# Patient Record
Sex: Female | Born: 1945 | Race: Black or African American | Hispanic: No | Smoking: Former smoker
Health system: Southern US, Community
[De-identification: ages and names within clinical notes are randomized; demographics above are authoritative.]

## PROBLEM LIST (undated history)

## (undated) DIAGNOSIS — K219 Gastro-esophageal reflux disease without esophagitis: Secondary | ICD-10-CM

## (undated) DIAGNOSIS — G629 Polyneuropathy, unspecified: Secondary | ICD-10-CM

## (undated) DIAGNOSIS — I10 Essential (primary) hypertension: Secondary | ICD-10-CM

## (undated) DIAGNOSIS — K279 Peptic ulcer, site unspecified, unspecified as acute or chronic, without hemorrhage or perforation: Secondary | ICD-10-CM

## (undated) DIAGNOSIS — G459 Transient cerebral ischemic attack, unspecified: Secondary | ICD-10-CM

## (undated) DIAGNOSIS — D649 Anemia, unspecified: Secondary | ICD-10-CM

## (undated) DIAGNOSIS — E785 Hyperlipidemia, unspecified: Secondary | ICD-10-CM

## (undated) DIAGNOSIS — M069 Rheumatoid arthritis, unspecified: Secondary | ICD-10-CM

## (undated) DIAGNOSIS — H269 Unspecified cataract: Secondary | ICD-10-CM

## (undated) DIAGNOSIS — M1990 Unspecified osteoarthritis, unspecified site: Secondary | ICD-10-CM

## (undated) DIAGNOSIS — I2510 Atherosclerotic heart disease of native coronary artery without angina pectoris: Secondary | ICD-10-CM

## (undated) DIAGNOSIS — N186 End stage renal disease: Secondary | ICD-10-CM

## (undated) HISTORY — PX: CHOLECYSTECTOMY: SHX55

## (undated) HISTORY — PX: SHOULDER SURGERY: SHX246

## (undated) HISTORY — DX: Hyperlipidemia, unspecified: E78.5

## (undated) HISTORY — PX: HIP FRACTURE SURGERY: SHX118

## (undated) HISTORY — DX: Unspecified osteoarthritis, unspecified site: M19.90

## (undated) HISTORY — DX: Transient cerebral ischemic attack, unspecified: G45.9

## (undated) HISTORY — DX: Unspecified cataract: H26.9

## (undated) HISTORY — PX: NECK SURGERY: SHX720

## (undated) HISTORY — DX: Rheumatoid arthritis, unspecified: M06.9

## (undated) HISTORY — DX: Essential (primary) hypertension: I10

## (undated) HISTORY — DX: Peptic ulcer, site unspecified, unspecified as acute or chronic, without hemorrhage or perforation: K27.9

## (undated) HISTORY — PX: KNEE SURGERY: SHX244

## (undated) HISTORY — DX: Gastro-esophageal reflux disease without esophagitis: K21.9

## (undated) HISTORY — PX: CARPAL TUNNEL RELEASE: SHX101

## (undated) HISTORY — PX: ABSCESS DRAINAGE: SHX1119

---

## 2000-05-20 ENCOUNTER — Encounter: Admission: RE | Admit: 2000-05-20 | Discharge: 2000-07-16 | Payer: Self-pay | Admitting: Unknown Physician Specialty

## 2003-02-17 ENCOUNTER — Observation Stay (HOSPITAL_COMMUNITY): Admission: EM | Admit: 2003-02-17 | Discharge: 2003-02-19 | Payer: Self-pay | Admitting: Cardiology

## 2003-07-09 ENCOUNTER — Ambulatory Visit (HOSPITAL_COMMUNITY): Admission: RE | Admit: 2003-07-09 | Discharge: 2003-07-11 | Payer: Self-pay | Admitting: Neurosurgery

## 2003-08-24 ENCOUNTER — Ambulatory Visit (HOSPITAL_COMMUNITY): Admission: RE | Admit: 2003-08-24 | Discharge: 2003-08-24 | Payer: Self-pay | Admitting: Orthopaedic Surgery

## 2003-08-24 ENCOUNTER — Ambulatory Visit (HOSPITAL_BASED_OUTPATIENT_CLINIC_OR_DEPARTMENT_OTHER): Admission: RE | Admit: 2003-08-24 | Discharge: 2003-08-24 | Payer: Self-pay | Admitting: Orthopaedic Surgery

## 2004-01-11 ENCOUNTER — Ambulatory Visit (HOSPITAL_BASED_OUTPATIENT_CLINIC_OR_DEPARTMENT_OTHER): Admission: RE | Admit: 2004-01-11 | Discharge: 2004-01-11 | Payer: Self-pay | Admitting: Orthopaedic Surgery

## 2004-01-11 ENCOUNTER — Ambulatory Visit (HOSPITAL_COMMUNITY): Admission: RE | Admit: 2004-01-11 | Discharge: 2004-01-11 | Payer: Self-pay | Admitting: Orthopaedic Surgery

## 2009-07-20 ENCOUNTER — Ambulatory Visit (HOSPITAL_COMMUNITY): Admission: RE | Admit: 2009-07-20 | Discharge: 2009-07-20 | Payer: Self-pay | Admitting: Internal Medicine
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
## 2010-11-03 NOTE — Cardiovascular Report (Signed)
Erica Crosby, Erica Crosby                              ACCOUNT NO.:  [id number]   MEDICAL RECORD NO.:  [id number]                   PATIENT TYPE:  INP   LOCATION:  3729                                 FACILITY:  [facility]   PHYSICIAN:  Loretha Brasil. Lia Foyer, M.D.             DATE OF BIRTH:  April 01, 1946   DATE OF PROCEDURE:  02/19/2003  DATE OF DISCHARGE:                              CARDIAC CATHETERIZATION   PROCEDURES:  1. Left heart catheterization.  2. Selective coronary arteriography.  3. Selective left ventriculography.   CARDIOLOGIST:  Loretha Brasil. Lia Foyer, M.D.   INDICATIONS:  Erica Crosby is a 65 year old woman who presents with chest  pain.  The exact etiology of the chest pain was uncertain, however, the  patient has multiple risk factors including diabetes, hypertension and  positive family history.  The current study was done to assess coronary  anatomy.   DESCRIPTION OF PROCEDURE:  The procedure was performed from the right  femoral artery using 6 French catheters.  She tolerated the procedure  well  and there were no complications.  She was taken to the holding area in  satisfactory clinical condition.   HEMODYNAMIC DATA:  1. The central aortic pressure was 133/71, mean 96.  2. Left ventricular pressure 142/18.  3. No gradient on pullback across the aortic valve.   ANGIOGRAPHIC DATA:  1. Ventriculography was performed in the RAO projection and overall systolic     function is well preserved.  No segmental abnormalities or contractions     were identified.  Ejection fraction appeared to exceed 65%.  2. The left main coronary artery is free of critical disease.  3. The left anterior descending artery coursed to the apex.  There was     approximately 0-10% narrowing of the proximal LAD with mild eccentric     plaquing.  After the diagonal in the distal vessel there was hinge point     that had about perhaps 30-50% area of luminal irregularity in the more     distal portion of the  vessel.  4. The circumflex provided a large first marginal with multiple bifurcations     and an AV circumflex, both of which were free of critical disease.  5. The right coronary artery had a dominant vessel with about 10-20%     eccentric plaquing in the proximal vessel.  The PDA and the posterior     lateral systems were without critical narrowing.   CONCLUSIONS:  1. Well-preserved left ventricular function without segmental wall motion     abnormality.  2. Minor coronary irregularities as noted above.    DISPOSITION:  We would strongly recommend that the patient discontinue  smoking.  D-Dimer will be checked.  Follow up with be with Dr. Dannielle Burn and  Dr. __________.  Loretha Brasil. Lia Foyer, M.D.    TDS/MEDQ  D:  02/19/2003  T:  02/19/2003  Job:  [id number]   cc:   Ernestine Mcmurray, M.D.  [address]  [place] [address]   [facility]   Dola Argyle, M.D.
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
## 2010-11-03 NOTE — Discharge Summary (Signed)
Erica Crosby, OSHER NO.:  [id number]   MEDICAL RECORD NO.:  [id number]                   PATIENT TYPE:  INP   LOCATION:  [id number]                                 FACILITY:  [facility]   PHYSICIAN:  Dola Argyle, M.D.                  DATE OF BIRTH:  November 24, 1945   DATE OF ADMISSION:  02/17/2003  DATE OF DISCHARGE:                           DISCHARGE SUMMARY - REFERRING   DISCHARGE DIAGNOSES:  Substernal chest pain, negative cardiac enzymes,  electrocardiogram with no ST segment changes, and finding of nonobstructive  coronary artery disease by left heart catheterization February 19, 2003.   SECONDARY DIAGNOSES:  1. Diabetes mellitus. Recommend treatment with Glucophage after 48 hours     rest following catheterization.  2. Hypertension.  3. Dyslipidemia.  4. Arthritis.  5. History of ongoing tobacco use.   PROCEDURE:  May 21, 2003 left heart catheterization, coronary  arteriography. This study shows that the left ventricle has normal systolic  function. There is no mitral regurgitation. Ejection fraction greater than  65%. The right coronary artery had a 10 to 20% proximal to mid point  stenosis. The left anterior descending had a 10% proximal stenosis and a 30  to 50% mid point stenosis. The left circumflex is without obstructive  disease. Diagonals from the LAD and the PDA and posterolateral branches from  the right coronary artery are without disease. The patient has not  experienced any complications from the catheterization. Her right groin is  without swelling or drainage. She has good perfusion to the right lower  extremity with palpable pulse in the right dorsalis pedis region. Her pain  is well controlled, although she did require some pain medications in the  rest period post catheterization. At the time of the catheterization and  throughout her hospitalization, she has been chest pain free although she  has been maintained on IV heparin  and IV nitroglycerin prior to  catheterization. She was also placed on Lopressor 25 mg b.i.d. prior to the  catheterization.   DISCHARGE DISPOSITION:  Ms. Erica Crosby is ready for discharge after six-hour  lying in period done post left heart catheterization done earlier on  September 3. Study showed nonobstructive coronary artery disease and normal  left ventricular function. The patient has had some pain in the right groin  area. This is not associated with swelling or oozing. The patient is  controlled with oral analgesics. The patient is chest pain free at the  current time. Mental status is clear. Her laboratories will be dictated at  the end of this discharge summary. [name] will go home on the following  as mentioned above. [name] has history of diabetes which is  uncontrolled. It is recommended that she start medication for this, and the  primary recommendation per cardiology is Glucophage to start after a 48 hour  period has elapsed after catheterization.  She has been placed on Altace for  hypertensive control in a setting of diabetes. She was also found to be  dyslipidemic, and it would be to her benefit to have statin therapy.   DISCHARGE MEDICATIONS:  She will go home on the following medications:  1. Enteric-coated aspirin 325 mg daily.  2. Altace 2.5 mg daily.  3. Fish oil capsules 1,000 mg daily.  4. Nitroglycerin 0.4 mg one tablet under the tongue every five minutes x3     doses as needed for chest pain.  5. She will also have some Percocet 5/325 one to two tablets every four to     six hours p.r.n. pain, but she may take Tylenol if that will suffice.   DISCHARGE DIET:  Low sodium, low cholesterol, diabetic diet.   ACTIVITY:  [name] may shower.   FOLLOW UP:  She is to call the [facility] office of [place] if her  catheterization site swells or become more painful. Telephone number [phone]. She is asked not to engage in any heavy lifting, no  driving, no sexual  activity for the next two days. She will have a visit at [facility] office Thursday, March 04, 2003 at 2:15 p.m. She is asked to  schedule an appointment with Dr. Seward Speck to discuss diabetes management.   BRIEF HISTORY:  Erica Crosby is a 65 year old African-American female. She has  multiple cardiac risk factors including diabetes, hypertension, and tobacco  use. The patient reports sudden onset of heavy substernal chest pain  starting on the evening of August 31. The patient stated this was a heavy  tightness radiating somewhat into the neck area as well as to back. She  describes it as a tight squeezing sensation. She also felt some associated  shortness of breath, and the patient did wax and wane although there was  gradual worsening during the early morning hours of September 1. The patient  has been very concerned about the problem and decided to come to the  emergency room at [facility] in [place]. In the emergency room,  the patient still had some substernal chest pain. There were no  electrocardiographic changes, but after having been given sublingual  nitroglycerin, she felt almost immediate improvement in symptoms. The  patient is quite comfortable at the time of examination. She has no  shortness of breath. She denies any prior history of orthopnea, paroxysmal  nocturnal dyspnea, palpitations, or recent syncope. Initial set of troponins  is also negative for elevation. The patient will be transferred to [facility], and in light of high risk factors, it is advisable to proceed  with left heart catheterization.   HOSPITAL COURSE:  After transfer to [facility] on IV heparin, IV  nitroglycerin, and addition of beta blocker, the patient was relatively pain  free. She was scheduled for left heart catheterization. This was done on September 3. This study showed nonobstructive coronary artery disease. The  patient  tolerated the procedure well and was ready for discharge the same  day, September 3. She goes home with the medications and the followup as  dictated above.   DISCHARGE LABORATORY DATA:  Serial cardiac enzymes are:  Creatine kinase in  serial fashion 164, then 159, then 154; troponin I 0.01, then 0.01, then  0.01; CK-MB is 1.6, then 1.3, then 1.4. Her fasting lipid study:  Cholesterol 167, triglycerides 198, cholesterol 39, HDL cholesterol is 39,  LDL cholesterol 88.  A D-dimer study was obtained; it was less than 0.22.  Complete blood cells on the morning of September 3:  White cells 8.1,  hemoglobin 12.2, hematocrit 36.3 platelets 187. Serum electrolytes on  admission:  Sodium 140, potassium 3.2, chloride 106, bicarbonate 28, glucose  146, BUN 6, creatinine 0.8. Of note, her potassium has been corrected this  hospitalization.      Sueanne Margarita, P.A.                    Dola Argyle, M.D.    GM/MEDQ  D:  02/19/2003  T:  02/20/2003  Job:  [id number]   cc:   [facility]  [address] [place], [place] [address]   Ernestine Mcmurray, M.D.  [address]  [place] [address]   [facility] [address]  [place] [address]  Fax: [phone]
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
## 2012-02-09 ENCOUNTER — Emergency Department (HOSPITAL_COMMUNITY): Payer: Medicare HMO

## 2012-02-09 ENCOUNTER — Encounter (HOSPITAL_COMMUNITY): Payer: Self-pay | Admitting: Emergency Medicine

## 2012-02-09 ENCOUNTER — Ambulatory Visit (HOSPITAL_COMMUNITY): Admit: 2012-02-09 | Payer: Self-pay | Admitting: Cardiovascular Disease

## 2012-02-09 ENCOUNTER — Encounter (HOSPITAL_COMMUNITY)
Admission: EM | Disposition: A | Discharge status: Home / Self Care | Payer: Self-pay | Source: Home / Self Care | Attending: Family Medicine

## 2012-02-09 ENCOUNTER — Observation Stay (HOSPITAL_COMMUNITY)
Admission: EM | Admit: 2012-02-09 | Discharge: 2012-02-11 | DRG: 312 | Disposition: A | Discharge status: Home / Self Care | Payer: Medicare HMO | Attending: Family Medicine | Admitting: Family Medicine

## 2012-02-09 DIAGNOSIS — E1149 Type 2 diabetes mellitus with other diabetic neurological complication: Secondary | ICD-10-CM | POA: Insufficient documentation

## 2012-02-09 DIAGNOSIS — R270 Ataxia, unspecified: Secondary | ICD-10-CM

## 2012-02-09 DIAGNOSIS — R279 Unspecified lack of coordination: Secondary | ICD-10-CM | POA: Insufficient documentation

## 2012-02-09 DIAGNOSIS — E1142 Type 2 diabetes mellitus with diabetic polyneuropathy: Secondary | ICD-10-CM | POA: Insufficient documentation

## 2012-02-09 DIAGNOSIS — R55 Syncope and collapse: Principal | ICD-10-CM | POA: Insufficient documentation

## 2012-02-09 DIAGNOSIS — G459 Transient cerebral ischemic attack, unspecified: Secondary | ICD-10-CM | POA: Insufficient documentation

## 2012-02-09 DIAGNOSIS — E1165 Type 2 diabetes mellitus with hyperglycemia: Secondary | ICD-10-CM

## 2012-02-09 DIAGNOSIS — IMO0002 Reserved for concepts with insufficient information to code with codable children: Secondary | ICD-10-CM

## 2012-02-09 DIAGNOSIS — R269 Unspecified abnormalities of gait and mobility: Secondary | ICD-10-CM | POA: Insufficient documentation

## 2012-02-09 DIAGNOSIS — G319 Degenerative disease of nervous system, unspecified: Secondary | ICD-10-CM | POA: Insufficient documentation

## 2012-02-09 DIAGNOSIS — I10 Essential (primary) hypertension: Secondary | ICD-10-CM | POA: Insufficient documentation

## 2012-02-09 DIAGNOSIS — E119 Type 2 diabetes mellitus without complications: Secondary | ICD-10-CM

## 2012-02-09 DIAGNOSIS — Z79899 Other long term (current) drug therapy: Secondary | ICD-10-CM | POA: Insufficient documentation

## 2012-02-09 DIAGNOSIS — I2510 Atherosclerotic heart disease of native coronary artery without angina pectoris: Secondary | ICD-10-CM | POA: Insufficient documentation

## 2012-02-09 DIAGNOSIS — Z7982 Long term (current) use of aspirin: Secondary | ICD-10-CM | POA: Insufficient documentation

## 2012-02-09 DIAGNOSIS — Z794 Long term (current) use of insulin: Secondary | ICD-10-CM | POA: Insufficient documentation

## 2012-02-09 DIAGNOSIS — R5381 Other malaise: Secondary | ICD-10-CM | POA: Insufficient documentation

## 2012-02-09 HISTORY — DX: Atherosclerotic heart disease of native coronary artery without angina pectoris: I25.10

## 2012-02-09 HISTORY — DX: Polyneuropathy, unspecified: G62.9

## 2012-02-09 HISTORY — DX: Essential (primary) hypertension: I10

## 2012-02-09 LAB — BASIC METABOLIC PANEL
BUN: 20 mg/dL (ref 6–23)
CO2: 25 mEq/L (ref 19–32)
Calcium: 10.4 mg/dL (ref 8.4–10.5)
Chloride: 96 mEq/L (ref 96–112)
Creatinine, Ser: 1.08 mg/dL (ref 0.50–1.10)
GFR calc Af Amer: 62 mL/min — ABNORMAL LOW (ref >90)
Glucose, Bld: 272 mg/dL — ABNORMAL HIGH (ref 70–99)
Potassium: 4.2 mEq/L (ref 3.5–5.1)
Sodium: 133 mEq/L — ABNORMAL LOW (ref 135–145)

## 2012-02-09 LAB — CBC
HCT: 40.5 % (ref 36.0–46.0)
Hemoglobin: 14.3 g/dL (ref 12.0–15.0)
MCH: 29.4 pg (ref 26.0–34.0)
MCHC: 35.3 g/dL (ref 30.0–36.0)
MCV: 83.2 fL (ref 78.0–100.0)
RBC: 4.87 MIL/uL (ref 3.87–5.11)
RDW: 13.6 % (ref 11.5–15.5)
WBC: 11.6 10*3/uL — ABNORMAL HIGH (ref 4.0–10.5)

## 2012-02-09 LAB — POCT I-STAT TROPONIN I: Troponin i, poc: 0.01 ng/mL (ref 0.00–0.08)

## 2012-02-09 LAB — GLUCOSE, CAPILLARY: Glucose-Capillary: 219 mg/dL — ABNORMAL HIGH (ref 70–99)

## 2012-02-09 SURGERY — LEFT HEART CATHETERIZATION WITH CORONARY ANGIOGRAM
Anesthesia: LOCAL

## 2012-02-09 MED ORDER — SODIUM CHLORIDE 0.9 % IV BOLUS (SEPSIS): 500.0000 mL | Freq: Once | INTRAVENOUS | Status: AC
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
## 2012-02-09 NOTE — ED Provider Notes (Signed)
History     CSN: [id number]  Arrival date & time 02/09/12  [id number]   First MD Initiated Contact with Patient 02/09/12 2007      Chief Complaint  Patient presents with  . Loss of Consciousness    (Consider location/radiation/quality/duration/timing/severity/associated sxs/prior treatment) HPIEva JOYLIN Crosby is a 66 y.o. female presenting as a code STEMI which was probably called off on arrival with a negative EKG. She was at a party earlier this evening when she had a syncopal event which was witnessed by others. He called EMS and brought her to the emergency department where she recovered uneventfully except when she told me she was 66 years old. Otherwise she has been alert and oriented, GCS 15.  She complains of no chest pain, shortness of breath, headache, dizziness, lightheadedness, numbness or tingling that is new. Patient does have some chronic neuropathy.   She has done nothing for the symptoms, there are no other alleviating or exacerbating factors no associated symptoms. She's not been sick recently.   Past Medical History  Diagnosis Date  . Hypertension   . Diabetes mellitus   . Neuropathy     Past Surgical History  Procedure Date  . Cardiac catheterization     states in the 90's    No family history on file.  History  Substance Use Topics  . Smoking status: Not on file  . Smokeless tobacco: Not on file  . Alcohol Use:     OB History    Grav Para Term Preterm Abortions TAB SAB Ect Mult Living                  Review of Systems .GENERAL: No fevers or chills, weight loss. HEENT: No change in vision, no earache, sore throat or sinus congestion. NECK: No pain or stiffness. CARDIOVASCULAR: No chest pain or pressure, palpitations, syncope. PULMONARY: No shortness of breath, cough or wheeze. GASTROINTESTINAL: No abdominal pain, nausea, vomiting or diarrhea, melena or bright red blood per rectum. GENITOURINARY: No urinary frequency, urgency, hesitancy or dysuria.  MUSCULOSKELETAL: No joint or muscle pain, no back pain, no recent trauma. DERMATOLOGIC: No rash, no itching, no lesions. NEUROLOGIC: Positive for syncope; No headache, seizures, numbness, tingling or weakness.   Allergies  Review of patient's allergies indicates no known allergies.  Home Medications   Current Outpatient Rx  Name Route Sig Dispense Refill  . GABAPENTIN 400 MG PO CAPS Oral Take 400 mg by mouth 2 (two) times daily.    . INSULIN GLARGINE 100 UNIT/ML [place] SOLN Subcutaneous Inject 50 Units into the skin every evening.    Marland Kitchen METFORMIN HCL 500 MG PO TABS Oral Take 500 mg by mouth 2 (two) times daily with a meal.    . OMEGA-3-ACID ETHYL ESTERS 1 G PO CAPS Oral Take 1 g by mouth daily.    Marland Kitchen PANTOPRAZOLE SODIUM 40 MG PO TBEC Oral Take 40 mg by mouth daily.    Marland Kitchen PRESCRIPTION MEDICATION Oral Take 1 tablet by mouth daily. High blood pressure medication    . SIMVASTATIN 40 MG PO TABS Oral Take 40 mg by mouth every evening.      BP 124/53  Pulse 94  Temp 98.3 F (36.8 C) (Oral)  Resp 14  SpO2 98%  Physical Exam CONSTITUTIONAL: Awake, oriented, appears non-toxic HENT: Atraumatic, normocephalic, oral mucosa pink and moist, airway patent. Nares patent without drainage. External ears normal. EYES: Conjunctiva clear, EOMI, PERRLA 4 mm, bilateral arcus senilis. NECK: Trachea midline, non-tender, supple CARDIOVASCULAR: Normal  heart rate, Normal rhythm, No murmurs, rubs, gallops PULMONARY/CHEST: Clear to auscultation, no rhonchi, wheezes, or rales. Symmetrical breath sounds. Non-tender. ABDOMINAL: Non-distended, soft, non-tender - no rebound or guarding.  BS normal. NEUROLOGIC: A Facial sensation equal to light touch bilaterally.  Good muscle bulk in the masseter muscle and good lateral movement of the jaw.  Facial expressions equal and good strength with smile/frown and puffed cheeks.  Hearing grossly intact to finger rub test.  Uvula, tongue are midline with no deviation. Symmetrical palate  elevation.  Trapezius and SCM muscles are 5/5 strength bilaterally.   Non-focal, moving all four extremities, no gross sensory or motor deficits. EXTREMITIES: No clubbing, cyanosis, or edema SKIN: Warm, Dry, No erythema, No rash ED Course  Procedures (including critical care time)  Labs Reviewed  CBC - Abnormal; Notable for the following:    WBC 11.6 (*)     All other components within normal limits  BASIC METABOLIC PANEL - Abnormal; Notable for the following:    Sodium 133 (*)     Glucose, Bld 272 (*)     GFR calc non Af Amer 53 (*)     GFR calc Af Amer 62 (*)     All other components within normal limits  GLUCOSE, CAPILLARY - Abnormal; Notable for the following:    Glucose-Capillary 219 (*)     All other components within normal limits  POCT I-STAT TROPONIN I   Dg Chest Portable 1 View  02/09/2012  *RADIOLOGY REPORT*  Clinical Data: 66 year old female ST elevation myocardial infarction.  Shortness of breath and chest pain.  PORTABLE CHEST - 1 VIEW  Comparison: None.  Findings: Portable AP semi upright view at 2039 hours.  Cardiac size and mediastinal contours are within normal limits.  Visualized tracheal air column is within normal limits.  No pulmonary edema, pneumothorax or effusion.  Perihilar crowding of markings and mild air bronchograms on the left.  Cervical ACDF hardware partially visible.  Negative visualized osseous structures.  IMPRESSION: Atelectasis, otherwise no acute cardiopulmonary abnormality.   Original Report Authenticated By: Randall An, M.D.      No diagnosis found.    MDM  Erica Crosby is a 66 y.o. female presenting to the emergency department without any past pertinent medical history for syncopal episode, CVA or cardiac history however does have some risk factors including elevated cholesterol, diabetes, with a syncopal episode.  She arrived via EMS as a coated stent he however she gets a mild elevation in one lead but nothing that was suggestive of an  ST elevation MI and she had no reciprocal changes so the code STEMI was called off by the cardiologist and myself. Patient's initial workup is negative, EKG shows no arrhythmia, no ST or T wave abnormalities suggestive of acute ischemia or infarction.  The differential for syncope is extensive and includes, but is not limited to: arrythmia (Vtach, SVT, SSS, sinus arrest, AV block, bradycardia) aortic stenosis, AMI, HOCM, PE, atrial myxoma, pulmonary hypertension, orthostatic hypotension, (hypovolemia, drug effect, GB syndrome, micturition, cough, swall) carotid sinus sensitivity, Seizure, TIA/CVA, hypoglycemia, or possibly vertigo.  Although this list, the most likely thing, is possible TIAs the patient did tell me she was 66 years old a 1 point and was transiently confused. She since recovered and feels fine, she denies any chest pain or shortness of breath at this time, dizziness or headache.  There is any indication at this time to obtain a head CT. She shows no neurologic deficits on physical  exam.  Discussed patient with Dr.Doutov with the patient for syncopal workup.   Rhunette Croft, MD 02/10/12 [id number]

## 2012-02-09 NOTE — ED Notes (Signed)
Amy called in Cath Lab to report cancel of Code Stemi per Dr. Gwenlyn Found.

## 2012-02-10 ENCOUNTER — Inpatient Hospital Stay (HOSPITAL_COMMUNITY): Payer: Medicare HMO

## 2012-02-10 ENCOUNTER — Observation Stay (HOSPITAL_COMMUNITY): Payer: Medicare HMO

## 2012-02-10 ENCOUNTER — Encounter (HOSPITAL_COMMUNITY): Payer: Self-pay | Admitting: Internal Medicine

## 2012-02-10 DIAGNOSIS — E119 Type 2 diabetes mellitus without complications: Secondary | ICD-10-CM | POA: Insufficient documentation

## 2012-02-10 DIAGNOSIS — E1121 Type 2 diabetes mellitus with diabetic nephropathy: Secondary | ICD-10-CM

## 2012-02-10 DIAGNOSIS — E118 Type 2 diabetes mellitus with unspecified complications: Secondary | ICD-10-CM | POA: Insufficient documentation

## 2012-02-10 DIAGNOSIS — I10 Essential (primary) hypertension: Secondary | ICD-10-CM

## 2012-02-10 DIAGNOSIS — E860 Dehydration: Secondary | ICD-10-CM | POA: Insufficient documentation

## 2012-02-10 DIAGNOSIS — IMO0002 Reserved for concepts with insufficient information to code with codable children: Secondary | ICD-10-CM

## 2012-02-10 DIAGNOSIS — R55 Syncope and collapse: Secondary | ICD-10-CM | POA: Diagnosis present

## 2012-02-10 DIAGNOSIS — R270 Ataxia, unspecified: Secondary | ICD-10-CM | POA: Diagnosis present

## 2012-02-10 DIAGNOSIS — I2510 Atherosclerotic heart disease of native coronary artery without angina pectoris: Secondary | ICD-10-CM

## 2012-02-10 DIAGNOSIS — G459 Transient cerebral ischemic attack, unspecified: Secondary | ICD-10-CM

## 2012-02-10 DIAGNOSIS — R42 Dizziness and giddiness: Secondary | ICD-10-CM

## 2012-02-10 DIAGNOSIS — D72829 Elevated white blood cell count, unspecified: Secondary | ICD-10-CM | POA: Insufficient documentation

## 2012-02-10 HISTORY — DX: Syncope and collapse: R55

## 2012-02-10 HISTORY — DX: Essential (primary) hypertension: I10

## 2012-02-10 HISTORY — DX: Transient cerebral ischemic attack, unspecified: G45.9

## 2012-02-10 LAB — LIPID PANEL
Cholesterol: 115 mg/dL (ref 0–200)
HDL: 35 mg/dL — ABNORMAL LOW (ref >39)
Total CHOL/HDL Ratio: 3.3 RATIO

## 2012-02-10 LAB — URINE MICROSCOPIC-ADD ON

## 2012-02-10 LAB — URINALYSIS, ROUTINE W REFLEX MICROSCOPIC
Glucose, UA: NEGATIVE mg/dL
Specific Gravity, Urine: 1.015 (ref 1.005–1.030)
pH: 5 (ref 5.0–8.0)

## 2012-02-10 LAB — GLUCOSE, CAPILLARY
Glucose-Capillary: 179 mg/dL — ABNORMAL HIGH (ref 70–99)
Glucose-Capillary: 265 mg/dL — ABNORMAL HIGH (ref 70–99)
Glucose-Capillary: 264 mg/dL — ABNORMAL HIGH (ref 70–99)

## 2012-02-10 MED ORDER — ACETAMINOPHEN 325 MG PO TABS
650.0000 mg | ORAL_TABLET | ORAL | Status: DC | PRN
  Administered 2012-02-10: 650 mg via ORAL
  Filled 2012-02-10: qty 2

## 2012-02-10 MED ORDER — SODIUM CHLORIDE 0.9 % IV SOLN
INTRAVENOUS | Status: AC
  Administered 2012-02-10: 05:00:00 via INTRAVENOUS

## 2012-02-10 MED ORDER — INSULIN GLARGINE 100 UNIT/ML ~~LOC~~ SOLN
50.0000 [IU] | Freq: Every evening | SUBCUTANEOUS | Status: DC
  Administered 2012-02-10: 50 [IU] via SUBCUTANEOUS

## 2012-02-10 MED ORDER — PANTOPRAZOLE SODIUM 40 MG PO TBEC
40.0000 mg | DELAYED_RELEASE_TABLET | Freq: Every day | ORAL | Status: DC
Start: 2012-02-10 — End: 2012-02-11
  Administered 2012-02-10 – 2012-02-11 (×2): 40 mg via ORAL
  Filled 2012-02-10: qty 1

## 2012-02-10 MED ORDER — SIMVASTATIN 40 MG PO TABS
40.0000 mg | ORAL_TABLET | Freq: Every evening | ORAL | Status: DC
Start: 2012-02-10 — End: 2012-02-11
  Administered 2012-02-10: 40 mg via ORAL
  Filled 2012-02-10 (×2): qty 1

## 2012-02-10 MED ORDER — GABAPENTIN 400 MG PO CAPS
400.0000 mg | ORAL_CAPSULE | Freq: Two times a day (BID) | ORAL | Status: DC
Start: 2012-02-10 — End: 2012-02-11
  Administered 2012-02-10 – 2012-02-11 (×3): 400 mg via ORAL
  Filled 2012-02-10 (×5): qty 1

## 2012-02-10 MED ORDER — INSULIN ASPART 100 UNIT/ML ~~LOC~~ SOLN
0.0000 [IU] | Freq: Three times a day (TID) | SUBCUTANEOUS | Status: DC
  Administered 2012-02-10: 2 [IU] via SUBCUTANEOUS
  Administered 2012-02-10: 3 [IU] via SUBCUTANEOUS
  Administered 2012-02-10: 8 [IU] via SUBCUTANEOUS
  Administered 2012-02-11: 2 [IU] via SUBCUTANEOUS

## 2012-02-10 MED ORDER — INSULIN ASPART 100 UNIT/ML ~~LOC~~ SOLN
0.0000 [IU] | Freq: Every day | SUBCUTANEOUS | Status: DC
  Administered 2012-02-10: 3 [IU] via SUBCUTANEOUS

## 2012-02-10 MED ORDER — ASPIRIN 325 MG PO TABS
325.0000 mg | ORAL_TABLET | Freq: Every day | ORAL | Status: DC
  Administered 2012-02-10 – 2012-02-11 (×2): 325 mg via ORAL
  Filled 2012-02-10 (×2): qty 1

## 2012-02-10 NOTE — ED Notes (Signed)
Patient transported to CT 

## 2012-02-10 NOTE — H&P (Signed)
PCP:  No local   Chief Complaint:   syncope  HPI: Erica Crosby is a 66 y.o. female   has a past medical history of Hypertension; Diabetes mellitus; Neuropathy; and Coronary artery disease.   Presented with  She was not feeling right all day. She went to a church party around 7 pm and set down for a meal. She felt light headed and then passed out. She was not hot. No alcohol was involved. This was witnessed. No urinary incontinence. Per report she was clammy. Patient does not remember the event. She remembered standing up after and walking to sit down.  She still feels wobbly and unwell. NO chest pain. No Shortness of breath now. When stands up she does appear unsteady. She states she has been falling in the past but never felt unsteady to this degree. Patient was slightly confused when she first presented to emergency department now she is back to her baseline.  Review of Systems:    Pertinent positives include: fatigue, dizziness, gait abnormality,   Constitutional:  No weight loss, night sweats, Fevers, chills, weight loss  HEENT:  No headaches, Difficulty swallowing,Tooth/dental problems,Sore throat,  No sneezing, itching, ear ache, nasal congestion, post nasal drip,  Cardio-vascular:  No chest pain, Orthopnea, PND, anasarca,  palpitations.no Bilateral lower extremity swelling  GI:  No heartburn, indigestion, abdominal pain, nausea, vomiting, diarrhea, change in bowel habits, loss of appetite, melena, blood in stool, hematemesis Resp:  no shortness of breath at rest. No dyspnea on exertion, No excess mucus, no productive cough, No non-productive cough, No coughing up of blood.No change in color of mucus.No wheezing. Skin:  no rash or lesions. No jaundice GU:  no dysuria, change in color of urine, no urgency or frequency. No straining to urinate.  No flank pain.  Musculoskeletal:  No joint pain or no joint swelling. No decreased range of motion. No back pain.  Psych:  No  change in mood or affect. No depression or anxiety. No memory loss.  Neuro: no localizing neurological complaints, no tingling, no weakness, no double vision, nono slurred speech, no confusion  Otherwise ROS are negative except for above, 10 systems were reviewed  Past Medical History: Past Medical History  Diagnosis Date  . Hypertension   . Diabetes mellitus   . Neuropathy   . Coronary artery disease    Past Surgical History  Procedure Date  . Cardiac catheterization     states in the 90's     Medications: Prior to Admission medications   Medication Sig Start Date End Date Taking? Authorizing Provider  gabapentin (NEURONTIN) 400 MG capsule Take 400 mg by mouth 2 (two) times daily.   Yes Historical Provider, MD  insulin glargine (LANTUS) 100 UNIT/ML injection Inject 50 Units into the skin every evening.   Yes Historical Provider, MD  metFORMIN (GLUCOPHAGE) 500 MG tablet Take 500 mg by mouth 2 (two) times daily with a meal.   Yes Historical Provider, MD  omega-3 acid ethyl esters (LOVAZA) 1 G capsule Take 1 g by mouth daily.   Yes Historical Provider, MD  pantoprazole (PROTONIX) 40 MG tablet Take 40 mg by mouth daily.   Yes Historical Provider, MD  PRESCRIPTION MEDICATION Take 1 tablet by mouth daily. High blood pressure medication   Yes Historical Provider, MD  simvastatin (ZOCOR) 40 MG tablet Take 40 mg by mouth every evening.   Yes Historical Provider, MD    Allergies:  No Known Allergies  Social History:  Ambulatory independently  Lives at home   reports that she has been smoking Cigarettes.  She has smoked for the past 25 years. She does not have any smokeless tobacco history on file. She reports that she does not drink alcohol or use illicit drugs.   Family History: family history includes Alcohol abuse in her father; Diabetes in her mother; and Heart disease in her mother and sister.    Physical Exam: Patient Vitals for the past 24 hrs:  BP Temp Temp src Pulse  Resp SpO2  02/10/12 0036 141/65 mmHg 98.2 F (36.8 C) Oral 91  14  98 %  02/09/12 2312 124/53 mmHg - - 94  - -  02/09/12 2311 120/57 mmHg - - 101  - -  02/09/12 2310 146/68 mmHg - - 90  - -  02/09/12 2003 151/69 mmHg 98.3 F (36.8 C) Oral 103  14  98 %    1. General:  in No Acute distress 2. Psychological: Alert and Oriented 3. Head/ENT:    Dry Mucous Membranes                          Head Non traumatic, neck supple                          Normal Dentition 4. SKIN: decreased Skin turgor,  Skin clean Dry and intact no rash 5. Heart: Regular rate and rhythm no Murmur, Rub or gallop 6. Lungs: Clear to auscultation bilaterally, no wheezes occasional crackles   7. Abdomen: Soft, non-tender, Non distended 8. Lower extremities: no clubbing, cyanosis, or edema 9. Neurologically Grossly intact, strength appears to be equal throughout, grips equal bilaterally. No drift. Cranial 2 through 12 intact no droop. She does have positive Romberg sign. Appears to be  unstable on her feet. 10. MSK: Normal range of motion  body mass index is unknown because there is no height or weight on file.   Labs on Admission:   [facility] 02/09/12 2007  NA 133*  K 4.2  CL 96  CO2 25  GLUCOSE 272*  BUN 20  CREATININE 1.08  CALCIUM 10.4  MG --  PHOS --   No results found for this basename: AST:2,ALT:2,ALKPHOS:2,BILITOT:2,PROT:2,ALBUMIN:2 in the last 72 hours No results found for this basename: LIPASE:2,AMYLASE:2 in the last 72 hours  Basename 02/09/12 2007  WBC 11.6*  NEUTROABS --  HGB 14.3  HCT 40.5  MCV 83.2  PLT 247   No results found for this basename: CKTOTAL:3,CKMB:3,CKMBINDEX:3,TROPONINI:3 in the last 72 hours No results found for this basename: TSH,T4TOTAL,FREET3,T3FREE,THYROIDAB in the last 72 hours No results found for this basename: VITAMINB12:2,FOLATE:2,FERRITIN:2,TIBC:2,IRON:2,RETICCTPCT:2 in the last 72 hours No results found for this basename: HGBA1C    CrCl is unknown because  there is no height on file for the current visit. ABG No results found for this basename: phart, pco2, po2, hco3, tco2, acidbasedef, o2sat     No results found for this basename: DDIMER     Other results:  I have pearsonaly reviewed this: ECG REPORT  No EKG was obtained  Cultures: No results found for this basename: sdes, specrequest, cult, reptstatus       Radiological Exams on Admission: Dg Chest Portable 1 View  02/09/2012  *RADIOLOGY REPORT*  Clinical Data: 66 year old female ST elevation myocardial infarction.  Shortness of breath and chest pain.  PORTABLE CHEST - 1 VIEW  Comparison: None.  Findings: Portable AP semi upright view at  2039 hours.  Cardiac size and mediastinal contours are within normal limits.  Visualized tracheal air column is within normal limits.  No pulmonary edema, pneumothorax or effusion.  Perihilar crowding of markings and mild air bronchograms on the left.  Cervical ACDF hardware partially visible.  Negative visualized osseous structures.  IMPRESSION: Atelectasis, otherwise no acute cardiopulmonary abnormality.   Original Report Authenticated By: Randall An, M.D.     Chart has been reviewed  Assessment/Plan  66 year old female with syncope/TIA  Present on Admission:  .Syncope and collapse - episode witnessed, cardiac versus neurological etiology. Will admit to telemetry obtain EKG now. Cycle cardiac markers. Update echo gram and Dopplers in a.m.  We'll do TIA workup as well given ataxia .Diabetes mellitus - sliding scale insulin hold metformin  .Hypertension - controlled with her home medications this seems to be clarified in a.m.  .Leukocytosis - will obtain UA chest x-ray is unremarkable  .Dehydration - she appears to be hemoconcentrated and dehydrated by clinical exam will give IV fluids  .TIA (transient ischemic attack) - worsening ataxia and syncope. Syncope is not very typical for TIA/stroke. But given now worsening ataxia we'll admit and  evaluate with MRI MRA in a.m. and a stat CT scan now.  .Ataxia - PTOT evaluation workup for TIA/CVA   Prophylaxis: SCD  Protonix  CODE STATUS: FULL CODE  Other plan as per orders.  I have spent a total of 55 min on this admission  Cassian Torelli 02/10/2012, 1:07 AM

## 2012-02-10 NOTE — Progress Notes (Signed)
VASCULAR LAB PRELIMINARY  PRELIMINARY  PRELIMINARY  PRELIMINARY  Carotid Dopplers completed.    Preliminary report:  There is no ICA stenosis.  Vertebral artery flow is antegrade.  Erica Crosby, 02/10/2012, 10:16 AM

## 2012-02-10 NOTE — Progress Notes (Signed)
[facility] PROGRESS NOTE  Erica Crosby [id number] DOB: Aug 21, 1945 DOA: 02/09/2012 PCP: Cleda Mccreedy, MD  Assessment/Plan: 1. Syncope--2d echo pending. Prelim dopplers unremarkable, cardiac markers negative thus far. MRI/MRA brain/head negative. EKG this am--SR, septal infarct age unknown, no previous studies. ED study has not been found. Questionable ataxia--PT evaluation. 2. DM type 2, uncontrolled by HgbA1c 9.9--stable as inpatient. Continue SSI. Metformin on hold. Continue Lantus, SSI. 3. HTN--stable. Not on any outpatient medications. 4. History of CAD--continue Zocor, ASA.  Code Status: Full code Family Communication: none at bedside Disposition Plan: home 8/26 if echo unremarkable  Murray Hodgkins, MD  [facility] Team 8 Pager [phone]. If 8PM-8AM, please contact night-coverage at [email or web address], password [facility] 02/10/2012, 12:16 PM  LOS: 1 day   Brief narrative: 66 year old woman presented with history of syncope. ST elevation noted by EMS and code STEMI called--subsequently canceled by Dr. Gwenlyn Found. Syncope occurred after sitting down. Felt lightheaded and passed out. No alcohol. No urinary incontinence.  Consultants:  None  Procedures:  Bilateral carotid dopplers--  2D echocardiogram--  HPI/Subjective: No complaints, no recurrent syncope, no lightheadedness, no pain.  Objective: Filed Vitals:   02/10/12 0257 02/10/12 0600 02/10/12 0800 02/10/12 1000  BP: 134/77 114/53 108/64 141/76  Pulse: 87 82 66 92  Temp: 98.1 F (36.7 C) 97.8 F (36.6 C)  97.4 F (36.3 C)  TempSrc:    Oral  Resp: 16 16  16   Height: 5\' 11"  (1.803 m)     Weight: 67 kg (147 lb 11.3 oz)     SpO2: 95% 95%      Intake/Output Summary (Last 24 hours) at 02/10/12 1216 Last data filed at 02/10/12 0817  Gross per 24 hour  Intake  437.5 ml  Output    550 ml  Net -112.5 ml   Filed Weights   02/10/12 0257  Weight: 67 kg (147 lb 11.3 oz)    Exam:   General:  Appears well,  calm & comfortable  Cardiovascular: RRR, no m/r/g. No LE edema.  Respiratory: CTA bilaterally, no w/r/r. Normal respiratory effort.  Abdomen: soft, ntnd  Skin: no rash or induration, non-tender  Psychiatric: speech fluent and clear, grossly normal mood and affect  Musculoskeletal: normal tone and strength BUE, BLE  Neuro: CN II-XII intact  Data Reviewed: Basic Metabolic Panel:  Lab 123XX123 2007  NA 133*  K 4.2  CL 96  CO2 25  GLUCOSE 272*  BUN 20  CREATININE 1.08  CALCIUM 10.4  MG --  PHOS --   CBC:  Lab 02/09/12 2007  WBC 11.6*  NEUTROABS --  HGB 14.3  HCT 40.5  MCV 83.2  PLT 247   Cardiac Enzymes:  Lab 02/10/12 0520  CKTOTAL 53  CKMB 1.8  CKMBINDEX --  TROPONINI <0.30   CBG:  Lab 02/10/12 1151 02/10/12 0730 02/10/12 0252 02/09/12 2303  GLUCAP 265* 179* 275* 219*   Studies: Ct Head Without Contrast  02/10/2012  *RADIOLOGY REPORT*   IMPRESSION: Mild white matter changes as above.  No CT evidence for acute intracranial abnormality.   Original Report Authenticated By: Suanne Marker, M.D.    Mri Brain Without Contrast  02/10/2012  *RADIOLOGY REPORT*  Clinical Data:  Syncopal episode.  Now baseline, with no neurologic deficit.  History of hypertension and diabetes mellitus.  MRI HEAD WITHOUT CONTRAST MRA HEAD WITHOUT CONTRAST  Technique:  Multiplanar, multiecho pulse sequences of the brain and surrounding structures were obtained without intravenous contrast. Angiographic images of the head were obtained using  MRA technique without contrast.  Comparison:  02/10/2012 CT scan.  MRI HEAD  Findings:  There is no evidence for acute infarction, intracranial hemorrhage, mass lesion, hydrocephalus, or extra-axial fluid. There is mild atrophy with moderate sequelae of chronic microvascular ischemia in the periventricular and subcortical white matter. No foci of chronic hemorrhage.  Pituitary and cerebellar tonsils unremarkable.  No midline shift.  Normal calvarium  and skull base.  Upper cervical region negative.  No acute sinus or mastoid disease.  IMPRESSION: Mild atrophy with chronic microvascular ischemic change.  No acute intracranial findings.  Good agreement with prior CT.  MRA HEAD  Findings: Both internal carotid arteries are widely patent with mild nonstenotic irregularity in the cavernous and supraclinoid segments.  No visible upper cervical dissection.  Basilar artery widely patent with vertebrals codominant.  There is no proximal stenosis of the anterior, middle, or posterior cerebral arteries. Mild irregularity of the distal MCA and PCA branches suggests intracranial atherosclerotic disease.  No visible berry aneurysm.  IMPRESSION: Unremarkable MR angiography intracranial circulation.  No proximal flow reducing lesion or dissection.   Original Report Authenticated By: Staci Righter, M.D.    Dg Chest Portable 1 View  02/09/2012  *RADIOLOGY REPORT*  Clinical Data: 66 year old female ST elevation myocardial infarction.  Shortness of breath and chest pain.  PORTABLE CHEST - 1 VIEW  Comparison: None.  Findings: Portable AP semi upright view at 2039 hours.  Cardiac size and mediastinal contours are within normal limits.  Visualized tracheal air column is within normal limits.  No pulmonary edema, pneumothorax or effusion.  Perihilar crowding of markings and mild air bronchograms on the left.  Cervical ACDF hardware partially visible.  Negative visualized osseous structures.  IMPRESSION: Atelectasis, otherwise no acute cardiopulmonary abnormality.   Original Report Authenticated By: Randall An, M.D.     Scheduled Meds:   . aspirin  325 mg Oral Daily  . gabapentin  400 mg Oral BID  . insulin aspart  0-15 Units Subcutaneous TID WC  . insulin aspart  0-5 Units Subcutaneous QHS  . insulin glargine  50 Units Subcutaneous QPM  . pantoprazole  40 mg Oral Q1200  . simvastatin  40 mg Oral QPM  . sodium chloride  500 mL Intravenous Once   Continuous  Infusions:   . sodium chloride 75 mL/hr (02/10/12 0700)    Principal Problem:  *Syncope and collapse Active Problems:  Diabetes mellitus  Hypertension  Leukocytosis  Dehydration  TIA (transient ischemic attack)  Ataxia     Murray Hodgkins, MD  [facility] Team 8 Pager [phone]. If 8PM-8AM, please contact night-coverage at [email or web address], password [facility] 02/10/2012, 12:16 PM  LOS: 1 day   Time spent: 20 minutes

## 2012-02-10 NOTE — Progress Notes (Signed)
  Echocardiogram 2D Echocardiogram has been performed.  Aletheia Tangredi FRANCES 02/10/2012, 4:47 PM

## 2012-02-10 NOTE — Evaluation (Signed)
Physical Therapy Evaluation Patient Details Name: Erica Crosby MRN: [id number] DOB: [birth year] Today's Date: 02/10/2012 Time: [id number] PT Time Calculation (min): 16 min  PT Assessment / Plan / Recommendation Clinical Impression  Patient is a 66 yo female admitted with syncope/collapse to r/o TIA.  Patient moving fairly well today.  Gait somewhat slow and guarded. Patient with history of falls.  Patient with feeling of unsteadiness with gait - no loss of balance noted.  Patient will benefit from formal balance assessment.  Recommend patient use her cane at discharge.  Will determine f/u needs after balance assessment.    PT Assessment  Patient needs continued PT services    Follow Up Recommendations   (TBD following balance assessment)    Barriers to Discharge Decreased caregiver support      Equipment Recommendations  None recommended by PT    Recommendations for Other Services     Frequency Min 3X/week    Precautions / Restrictions Precautions Precautions: Fall Precaution Comments: history of falls Restrictions Weight Bearing Restrictions: No         Mobility  Bed Mobility Bed Mobility: Supine to Sit;Sitting - Scoot to Edge of Bed Supine to Sit: 7: Independent;HOB flat Sitting - Scoot to Edge of Bed: 7: Independent Details for Bed Mobility Assistance: No cues or assist needed Transfers Transfers: Sit to Stand;Stand to Sit Sit to Stand: 5: Supervision;With upper extremity assist;From bed Stand to Sit: 5: Supervision;With upper extremity assist;With armrests;To chair/3-in-1 Details for Transfer Assistance: Verbal cues for hand placement.  Supervision for safety only. Ambulation/Gait Ambulation/Gait Assistance: 5: Supervision Ambulation Distance (Feet): 110 Feet Assistive device: None Ambulation/Gait Assistance Details: Patient with slow, guarded gait.  No assistive device or physical assist needed.  No loss of balance during gait or during turns. Gait Pattern:  Step-through pattern;Decreased stride length;Decreased trunk rotation Gait velocity: slow gait speed Modified Rankin (Stroke Patients Only) Pre-Morbid Rankin Score: No symptoms Modified Rankin: No significant disability    Exercises     PT Diagnosis: Abnormality of gait;Generalized weakness  PT Problem List: Decreased activity tolerance;Decreased balance;Decreased knowledge of use of DME;Impaired sensation PT Treatment Interventions: DME instruction;Gait training;Balance training;Patient/family education   PT Goals Acute Rehab PT Goals PT Goal Formulation: With patient Time For Goal Achievement: 02/17/12 Potential to Achieve Goals: Good Pt will go Sit to Stand: with modified independence PT Goal: Sit to Stand - Progress: Goal set today Pt will go Stand to Sit: with modified independence PT Goal: Stand to Sit - Progress: Goal set today Pt will Ambulate: >150 feet;with modified independence;with least restrictive assistive device (with no loss of balance) PT Goal: Ambulate - Progress: Goal set today Pt will Go Up / Down Stairs: 3-5 stairs;with supervision;with least restrictive assistive device PT Goal: Up/Down Stairs - Progress: Goal set today Additional Goals Additional Goal #1: Patient will score > 20 on DGI balance assessment to indicate low fall risk. PT Goal: Additional Goal #1 - Progress: Goal set today  Visit Information  Last PT Received On: 02/10/12 Assistance Needed: +1    Subjective Data  Subjective: "I just feel really shaky when I'm walking" Patient Stated Goal: To go home   Prior [place] Lives With: Alone Available Help at Discharge: Family;Available PRN/intermittently Type of Home: Mobile home Home Access: Stairs to enter Entrance Stairs-Number of Steps: 4 Entrance Stairs-Rails: Right;Left Home Layout: One level Bathroom Shower/Tub: Chiropodist: Standard Home Adaptive Equipment: Straight cane;Bedside  commode/3-in-1 Prior Function Level of Independence: Independent Able to  Take Stairs?: Yes Driving: Yes Vocation: Retired Corporate investment banker: No difficulties Dominant Hand: Right    Cognition  Overall Cognitive Status: Appears within functional limits for tasks assessed/performed Arousal/Alertness: Awake/alert Orientation Level: Appears intact for tasks assessed Behavior During Session: [facility] for tasks performed    Extremity/Trunk Assessment Right Upper Extremity Assessment RUE ROM/Strength/Tone: WFL for tasks assessed RUE Sensation: WFL - Light Touch Left Upper Extremity Assessment LUE ROM/Strength/Tone: WFL for tasks assessed LUE Sensation: WFL - Light Touch Right Lower Extremity Assessment RLE ROM/Strength/Tone: WFL for tasks assessed RLE Sensation: History of peripheral neuropathy Left Lower Extremity Assessment LLE ROM/Strength/Tone: WFL for tasks assessed LLE Sensation: History of peripheral neuropathy Trunk Assessment Trunk Assessment: Normal   Balance    End of Session PT - End of Session Equipment Utilized During Treatment: Gait belt Activity Tolerance: Patient tolerated treatment well Patient left: in chair;with call bell/phone within reach Nurse Communication: Mobility status  GP     Despina Pole 02/10/2012, 2:07 PM Carita Pian. Sanjuana Kava, [place] Pager [phone]

## 2012-02-10 NOTE — Progress Notes (Signed)
12 lead EKG obtained

## 2012-02-11 LAB — CBC
MCHC: 34.1 g/dL (ref 30.0–36.0)
MCV: 82.4 fL (ref 78.0–100.0)
Platelets: 231 10*3/uL (ref 150–400)
RDW: 13.7 % (ref 11.5–15.5)
WBC: 8 10*3/uL (ref 4.0–10.5)

## 2012-02-11 LAB — BASIC METABOLIC PANEL
Chloride: 103 mEq/L (ref 96–112)
Creatinine, Ser: 0.81 mg/dL (ref 0.50–1.10)
GFR calc Af Amer: 87 mL/min — ABNORMAL LOW (ref >90)
GFR calc non Af Amer: 75 mL/min — ABNORMAL LOW (ref >90)

## 2012-02-11 LAB — GLUCOSE, CAPILLARY
Glucose-Capillary: 138 mg/dL — ABNORMAL HIGH (ref 70–99)
Glucose-Capillary: 260 mg/dL — ABNORMAL HIGH (ref 70–99)

## 2012-02-11 NOTE — Progress Notes (Signed)
Physical Therapy Treatment Patient Details Name: Erica Crosby MRN: [id number] DOB: 09-14-1945 Today's Date: 02/11/2012 Time: 0822-0839 PT Time Calculation (min): 17 min  PT Assessment / Plan / Recommendation Comments on Treatment Session  pt presents with r/o TIA.  pt inidactes R UE and LE dullness.  Indicates dullness in L LE, but not as bad as R LE.  pt would benefit from OPPT for balance.      Follow Up Recommendations  Outpatient PT    Barriers to Discharge        Equipment Recommendations  None recommended by PT    Recommendations for Other Services    Frequency Min 4X/week   Plan Frequency needs to be updated;Discharge plan needs to be updated    Precautions / Restrictions Precautions Precautions: Fall Precaution Comments: history of falls Restrictions Weight Bearing Restrictions: No   Pertinent Vitals/Pain Denies pain.      Mobility  Bed Mobility Bed Mobility: Supine to Sit;Sitting - Scoot to Edge of Bed Supine to Sit: 7: Independent;HOB flat Sitting - Scoot to Edge of Bed: 7: Independent Details for Bed Mobility Assistance: No cues or assist needed Transfers Transfers: Sit to Stand;Stand to Sit Sit to Stand: From bed;7: Independent;With upper extremity assist Stand to Sit: With upper extremity assist;With armrests;To chair/3-in-1;7: Independent Details for Transfer Assistance: demos good technique.   Ambulation/Gait Ambulation/Gait Assistance: 5: Supervision Ambulation Distance (Feet): 200 Feet Assistive device: None Ambulation/Gait Assistance Details: pt moves slowly and when attempting head turns pt reluctantly movesw head.   Gait Pattern: Step-through pattern;Decreased stride length;Decreased trunk rotation Gait velocity: slow gait speed Stairs: Yes Stairs Assistance: 5: Supervision Stairs Assistance Details (indicate cue type and reason): uses one rail with good safety.   Stair Management Technique: One rail Right;Forwards Number of Stairs: 11    Wheelchair Mobility Wheelchair Mobility: No Modified Rankin (Stroke Patients Only) Pre-Morbid Rankin Score: No symptoms Modified Rankin: No significant disability    Exercises     PT Diagnosis:    PT Problem List:   PT Treatment Interventions:     PT Goals Acute Rehab PT Goals Time For Goal Achievement: 02/17/12 PT Goal: Sit to Stand - Progress: Progressing toward goal PT Goal: Stand to Sit - Progress: Progressing toward goal PT Goal: Ambulate - Progress: Progressing toward goal PT Goal: Up/Down Stairs - Progress: Progressing toward goal Additional Goals PT Goal: Additional Goal #1 - Progress: Progressing toward goal  Visit Information  Last PT Received On: 02/11/12 Assistance Needed: +1 PT/OT Co-Evaluation/Treatment: Yes    Subjective Data  Subjective: pt indicating she can't wait for her family to leave her alone.     Cognition  Overall Cognitive Status: Appears within functional limits for tasks assessed/performed Arousal/Alertness: Awake/alert Orientation Level: Appears intact for tasks assessed Behavior During Session: [facility] for tasks performed    Balance  Balance Balance Assessed: Yes Dynamic Standing Balance Dynamic Standing - Balance Support: During functional activity Standardized Balance Assessment Standardized Balance Assessment: Dynamic Gait Index Dynamic Gait Index Level Surface: Normal Change in Gait Speed: Normal Gait with Horizontal Head Turns: Mild Impairment Gait with Vertical Head Turns: Mild Impairment Gait and Pivot Turn: Normal Step Over Obstacle: Moderate Impairment Step Around Obstacles: Mild Impairment Steps: Mild Impairment Total Score: 18  High Level Balance High Level Balance Activites: Direction changes;Turns;Sudden stops;Head turns  End of Session PT - End of Session Equipment Utilized During Treatment: Gait belt Activity Tolerance: Patient tolerated treatment well Patient left: in chair;with call bell/phone within reach Nurse  Communication:  Mobility status   GP     Erica Crosby, [place] 02/11/2012, 10:22 AM

## 2012-02-11 NOTE — Progress Notes (Signed)
[facility] PROGRESS NOTE  Erica Crosby [id number] DOB: [birth year] DOA: 02/09/2012 PCP: Cleda Mccreedy, MD  Assessment/Plan: 1. Syncope--2d echo pending. Prelim dopplers unremarkable, cardiac markers negative thus far. MRI/MRA brain/head negative. EKG this am--SR, septal infarct age unknown, no previous studies. ED study has not been found. Questionable ataxia--PT evaluation. 2. DM type 2, uncontrolled by HgbA1c 9.9--stable as inpatient. Continue SSI. Metformin on hold. Continue Lantus, SSI. 3. HTN--stable. Not on any outpatient medications. 4. History of CAD--continue Zocor, ASA.  Code Status: Full code Family Communication: none at bedside Disposition Plan: home. Declines HH.  Murray Hodgkins, MD  [facility] Team 8 Pager [phone]. If 8PM-8AM, please contact night-coverage at [email or web address], password [facility] 02/11/2012, 12:18 PM  LOS: 2 days   Brief narrative: 66 year old woman presented with history of syncope. ST elevation noted by EMS and code STEMI called--subsequently canceled by Dr. Gwenlyn Found. Syncope occurred after sitting down. Felt lightheaded and passed out. No alcohol. No urinary incontinence.  Consultants:  PT--Outpatient PT  OT--home health OT  Procedures:  Bilateral carotid dopplers--(preliminary report) There is no ICA stenosis. Vertebral artery flow is antegrade.  2D echocardiogram--LVEF 65-70%, wall motion was normal. Grade 1 diastolic dysfunction  HPI/Subjective: No complaints, no recurrent syncope, no lightheadedness, no pain.  Objective: Filed Vitals:   02/11/12 0000 02/11/12 0400 02/11/12 0803 02/11/12 1158  BP: 149/82 137/73 144/80 149/75  Pulse: 79 82 82 85  Temp: 98 F (36.7 C) 97.7 F (36.5 C) 98.4 F (36.9 C) 97.2 F (36.2 C)  TempSrc:   Oral   Resp: 15 15 16 16   Height:      Weight:      SpO2: 99% 97% 96% 99%    Intake/Output Summary (Last 24 hours) at 02/11/12 1218 Last data filed at 02/11/12 0926  Gross per 24 hour    Intake   1425 ml  Output   1800 ml  Net   -375 ml   Filed Weights   02/10/12 0257  Weight: 67 kg (147 lb 11.3 oz)    Exam:   General:  Appears well, calm & comfortable  Cardiovascular: RRR, no m/r/g. No LE edema.  Respiratory: CTA bilaterally, no w/r/r. Normal respiratory effort.  Abdomen: soft, ntnd  Skin: no rash or induration, non-tender  Psychiatric: speech fluent and clear, grossly normal mood and affect  Musculoskeletal: normal tone and strength BUE, BLE  Neuro: CN II-XII intact  Data Reviewed: Basic Metabolic Panel:  Lab [id number] 0526 02/09/12 2007  NA 138 133*  K 3.9 4.2  CL 103 96  CO2 23 25  GLUCOSE 140* 272*  BUN 17 20  CREATININE 0.81 1.08  CALCIUM 10.0 10.4  MG -- --  PHOS -- --   CBC:  Lab 02/11/12 0526 02/09/12 2007  WBC 8.0 11.6*  NEUTROABS -- --  HGB 12.6 14.3  HCT 36.9 40.5  MCV 82.4 83.2  PLT 231 247   Cardiac Enzymes:  Lab 02/10/12 0520  CKTOTAL 53  CKMB 1.8  CKMBINDEX --  TROPONINI <0.30   CBG:  Lab 02/11/12 1059 02/11/12 0726 02/10/12 2014 02/10/12 1628 02/10/12 1151  GLUCAP 260* 138* 264* 148* 265*   Studies: Ct Head Without Contrast  02/10/2012  *RADIOLOGY REPORT*   IMPRESSION: Mild white matter changes as above.  No CT evidence for acute intracranial abnormality.   Original Report Authenticated By: Suanne Marker, M.D.    Mri Brain Without Contrast  02/10/2012  *RADIOLOGY REPORT*  Clinical Data:  Syncopal episode.  Now baseline, with no  neurologic deficit.  History of hypertension and diabetes mellitus.  MRI HEAD WITHOUT CONTRAST MRA HEAD WITHOUT CONTRAST  Technique:  Multiplanar, multiecho pulse sequences of the brain and surrounding structures were obtained without intravenous contrast. Angiographic images of the head were obtained using MRA technique without contrast.  Comparison:  02/10/2012 CT scan.  MRI HEAD  Findings:  There is no evidence for acute infarction, intracranial hemorrhage, mass lesion,  hydrocephalus, or extra-axial fluid. There is mild atrophy with moderate sequelae of chronic microvascular ischemia in the periventricular and subcortical white matter. No foci of chronic hemorrhage.  Pituitary and cerebellar tonsils unremarkable.  No midline shift.  Normal calvarium and skull base.  Upper cervical region negative.  No acute sinus or mastoid disease.  IMPRESSION: Mild atrophy with chronic microvascular ischemic change.  No acute intracranial findings.  Good agreement with prior CT.  MRA HEAD  Findings: Both internal carotid arteries are widely patent with mild nonstenotic irregularity in the cavernous and supraclinoid segments.  No visible upper cervical dissection.  Basilar artery widely patent with vertebrals codominant.  There is no proximal stenosis of the anterior, middle, or posterior cerebral arteries. Mild irregularity of the distal MCA and PCA branches suggests intracranial atherosclerotic disease.  No visible berry aneurysm.  IMPRESSION: Unremarkable MR angiography intracranial circulation.  No proximal flow reducing lesion or dissection.   Original Report Authenticated By: Staci Righter, M.D.    Dg Chest Portable 1 View  02/09/2012  *RADIOLOGY REPORT*  Clinical Data: 66 year old female ST elevation myocardial infarction.  Shortness of breath and chest pain.  PORTABLE CHEST - 1 VIEW  Comparison: None.  Findings: Portable AP semi upright view at 2039 hours.  Cardiac size and mediastinal contours are within normal limits.  Visualized tracheal air column is within normal limits.  No pulmonary edema, pneumothorax or effusion.  Perihilar crowding of markings and mild air bronchograms on the left.  Cervical ACDF hardware partially visible.  Negative visualized osseous structures.  IMPRESSION: Atelectasis, otherwise no acute cardiopulmonary abnormality.   Original Report Authenticated By: Randall An, M.D.     Scheduled Meds:    . aspirin  325 mg Oral Daily  . gabapentin  400 mg  Oral BID  . insulin aspart  0-15 Units Subcutaneous TID WC  . insulin aspart  0-5 Units Subcutaneous QHS  . insulin glargine  50 Units Subcutaneous QPM  . pantoprazole  40 mg Oral Q1200  . simvastatin  40 mg Oral QPM   Continuous Infusions:    . sodium chloride 75 mL/hr (02/10/12 0700)    Principal Problem:  *Syncope and collapse Active Problems:  Hypertension  Ataxia  DM (diabetes mellitus), type 2, uncontrolled     Murray Hodgkins, MD  [facility] Team 8 Pager [phone]. If 8PM-8AM, please contact night-coverage at [email or web address], password [facility] 02/11/2012, 12:18 PM  LOS: 2 days   Time spent: 20 minutes

## 2012-02-11 NOTE — Evaluation (Signed)
Occupational Therapy Evaluation Patient Details Name: Erica Crosby MRN: [id number] DOB: 28-Jan-1946 Today's Date: 02/11/2012 Time: [id number] OT Time Calculation (min): 18 min  OT Assessment / Plan / Recommendation Clinical Impression  9 female admitted with syncopal episode r/o TIA. OT to follow acutely due to balance deficits. Recommend HHOT for d/c planning.    OT Assessment  Patient needs continued OT Services    Follow Up Recommendations  Home health OT    Barriers to Discharge      Equipment Recommendations  None recommended by OT    Recommendations for Other Services    Frequency  Min 2X/week    Precautions / Restrictions Precautions Precautions: Fall Precaution Comments: history of falls Restrictions Weight Bearing Restrictions: No   Pertinent Vitals/Pain None Balance HIGH FALL RISK     ADL  Eating/Feeding: Performed;Modified independent Where Assessed - Eating/Feeding: Edge of bed Grooming: Performed;Wash/dry hands;Wash/dry face;Teeth care;Modified independent Where Assessed - Grooming: Unsupported standing (Loose Rt grasp on tooth brush) Toilet Transfer: Performed;Modified independent Toilet Transfer Method: Sit to Loss adjuster, chartered: Regular height toilet Toileting - Clothing Manipulation and Hygiene: Performed;Modified independent Where Assessed - Toileting Clothing Manipulation and Hygiene: Sit to stand from 3-in-1 or toilet Tub/Shower Transfer: Simulated;Supervision/safety Tub/Shower Transfer Method: Therapist, art: Other (comment) (stepping over trash can to simulated) Equipment Used: Gait belt Transfers/Ambulation Related to ADLs: PT ambulated the hall with Mod I >100 ft. Pt completed DGI ADL Comments: PT anxious to leave the hospital today. Pt demonstrates fine motor by opening tooth brush and tooth paste. 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"    OT Diagnosis:  Generalized weakness  OT Problem List: Impaired balance (sitting and/or standing);Decreased safety awareness;Decreased knowledge of use of DME or AE;Decreased knowledge of precautions OT Treatment Interventions: Self-care/ADL training;Therapeutic activities;Patient/family education;Balance training   OT Goals Acute Rehab OT Goals OT Goal Formulation: With patient Time For Goal Achievement: 02/18/12 Potential to Achieve Goals: Good ADL Goals Pt Will Perform Upper Body Bathing: Independently;Standing at sink;Unsupported Pt Will Perform Lower Body Bathing: Independently ADL Goal: Lower Body Bathing - Progress: Goal set today Pt Will Perform Upper Body Dressing: Independently;Unsupported;Sit to stand from chair ADL Goal: Upper Body Dressing - Progress: Goal set today Pt Will Perform Lower Body Dressing: Independently;Sit to stand from chair;Unsupported ADL Goal: Lower Body Dressing - Progress: Goal set today Pt Will Perform Tub/Shower Transfer: Tub transfer;with modified independence;Ambulation ADL Goal: Tub/Shower Transfer - Progress: Goal set today  Visit Information  Last OT Received On: 02/11/12 Assistance Needed: +1 PT/OT Co-Evaluation/Treatment: Yes    Subjective Data  Subjective: "I am going home today. I thought I would go yesterday Patient Stated Goal: to go home today   Prior Functioning  Vision/Perception  Home Living Lives With: Alone Available Help at Discharge: Family;Available PRN/intermittently Type of Home: Mobile home Home Access: Stairs to enter Entrance Stairs-Number of Steps: 4 Entrance Stairs-Rails: Right;Left Home Layout: One level Bathroom Shower/Tub: Chiropodist: Standard Home Adaptive Equipment: Straight cane;Bedside commode/3-in-1 Prior Function Level of Independence: Independent Able to Take Stairs?: Yes Driving: Yes Vocation: Retired Comments: "I didnt want to come up here when they made me. I am going home and sending them  all home." - PT states she has family assist at home in evenings but they work during the day Communication Communication: No difficulties Dominant Hand: Right      Cognition  Overall Cognitive Status: Appears within functional  limits for tasks assessed/performed Arousal/Alertness: Awake/alert Orientation Level: Appears intact for tasks assessed Behavior During Session: [facility] for tasks performed    Extremity/Trunk Assessment Right Upper Extremity Assessment RUE ROM/Strength/Tone: WFL for tasks assessed RUE Sensation: WFL - Light Touch RUE Coordination: WFL - gross/fine motor Left Upper Extremity Assessment LUE ROM/Strength/Tone: WFL for tasks assessed LUE Sensation: WFL - Light Touch LUE Coordination: WFL - gross/fine motor Trunk Assessment Trunk Assessment: Normal   Mobility Bed Mobility Supine to Sit: 7: Independent;HOB flat Sitting - Scoot to Edge of Bed: 7: Independent Details for Bed Mobility Assistance: No cues or assist needed Transfers Sit to Stand: From bed;7: Independent;With upper extremity assist Stand to Sit: With upper extremity assist;With armrests;To chair/3-in-1;7: Independent   Exercise    Balance Balance Balance Assessed: Yes Dynamic Standing Balance Dynamic Standing - Balance Support: During functional activity Standardized Balance Assessment Standardized Balance Assessment: Dynamic Gait Index Dynamic Gait Index Level Surface: Normal Change in Gait Speed: Normal Gait with Horizontal Head Turns: Mild Impairment Gait with Vertical Head Turns: Mild Impairment Gait and Pivot Turn: Normal Step Over Obstacle: Moderate Impairment Step Around Obstacles: Mild Impairment Steps: Mild Impairment Total Score: 18  High Level Balance High Level Balance Activites: Direction changes;Turns;Sudden stops;Head turns  End of Session OT - End of Session Equipment Utilized During Treatment: Gait belt Activity Tolerance: Patient tolerated treatment well Patient left: in  chair;with call bell/phone within reach Nurse Communication: Mobility status;Precautions  GO     Veneda Melter 02/11/2012, 8:58 AM Pager: [phone]

## 2012-02-11 NOTE — Discharge Summary (Signed)
Physician Discharge Summary  Erica Crosby [id number] DOB: 06-16-1946 DOA: 02/09/2012  PCP: Erica Mccreedy, MD  Admit date: 02/09/2012 Discharge date: 02/11/2012  Recommendations for Outpatient Follow-up:  1. Follow-up DM, Hgb A1c 9.9 2. Follow-up syncope 3. Patient refused home health OT, PT  Follow-up Information    Follow up with Erica Mccreedy, MD in 1 week.   Contact information:   Erica Crosby [place] [phone]         Discharge Diagnoses:  1. Syncope 2. DM type 2, uncontrolled 3. HTN 4. History of CAD  Discharge Condition: improved Disposition: home  Diet recommendation: heart healthy, diabetic  History of present illness:  66 year old woman presented with history of syncope. ST elevation noted by EMS and code STEMI called--subsequently canceled by Dr. Gwenlyn Found. Syncope occurred after sitting down. Felt lightheaded and passed out. No alcohol. No urinary incontinence.  Hospital Course:  [name] was admitted for further evaluation of syncope. She had no recurrent episodes, telemetry revealed sinus rhythm only, cardiac enzymes were negative and 2D echocardiogram was unremarkable. History most suggestive of vasovagal syncope; alternatively this occurred at dinner time and patient had skipped lunch. Reported CBG 200 after the fact per patient. Imaging of brain was negative. No signs, symptoms or history to suggest sinister etiology. Evaluated by PT and OT--services refused by patient. 1. Syncope--2d echo unremarkable. Prelim dopplers unremarkable, cardiac markers negative . MRI/MRA brain/head negative. EKG--SR, septal infarct age unknown, no previous studies.  2. DM type 2, uncontrolled by HgbA1c 9.9--stable as inpatient, no hypoglycemia. Continue SSI. Resume metformin on discharge. Continue Lantus. 3. HTN--stable. Resume outpatient medication. 4. History of CAD--continue Zocor, ASA.  Consultants:  PT--Outpatient PT   OT--home health  OT  Procedures:  Bilateral carotid dopplers--(preliminary report) There is no ICA stenosis. Vertebral artery flow is antegrade.  2D echocardiogram--LVEF 65-70%, wall motion was normal. Grade 1 diastolic dysfunction  Discharge Instructions  Discharge Orders    Future Orders Please Complete By Expires   Diet - low sodium heart healthy      Diet Carb Modified      Increase activity slowly      Discharge instructions      Comments:   Be sure to follow-up with your primary care doctor as directed. Call physician or seek medical attention for recurrent passing out.     Medication List  As of 02/11/2012 12:43 PM   TAKE these medications         gabapentin 400 MG capsule   Commonly known as: NEURONTIN   Take 400 mg by mouth 2 (two) times daily.      insulin glargine 100 UNIT/ML injection   Commonly known as: LANTUS   Inject 50 Units into the skin every evening.      metFORMIN 500 MG tablet   Commonly known as: GLUCOPHAGE   Take 500 mg by mouth 2 (two) times daily with a meal.      omega-3 acid ethyl esters 1 G capsule   Commonly known as: LOVAZA   Take 1 g by mouth daily.      pantoprazole 40 MG tablet   Commonly known as: PROTONIX   Take 40 mg by mouth daily.      PRESCRIPTION MEDICATION   Take 1 tablet by mouth daily. High blood pressure medication      simvastatin 40 MG tablet   Commonly known as: ZOCOR   Take 40 mg by mouth every evening.  The results of significant diagnostics from this hospitalization (including imaging, microbiology, ancillary and laboratory) are listed below for reference.    Significant Diagnostic Studies: Ct Head Without Contrast  02/10/2012  *RADIOLOGY REPORT*  Clinical Data: Loss of consciousness  CT HEAD WITHOUT CONTRAST  Technique:  Contiguous axial images were obtained from the base of the skull through the vertex without contrast.  Comparison: None.  Findings: Mild periventricular and subcortical white matter hypodensities are  most in keeping with chronic microangiopathic change. There is no evidence for acute hemorrhage, hydrocephalus, mass lesion, or abnormal extra-axial fluid collection.  No definite CT evidence for acute infarction.  The visualized paranasal sinuses and mastoid air cells are predominately clear.  IMPRESSION: Mild white matter changes as above.  No CT evidence for acute intracranial abnormality.   Original Report Authenticated By: Suanne Marker, M.D.    Mri Brain Without Contrast  02/10/2012  *RADIOLOGY REPORT*  Clinical Data:  Syncopal episode.  Now baseline, with no neurologic deficit.  History of hypertension and diabetes mellitus.  MRI HEAD WITHOUT CONTRAST MRA HEAD WITHOUT CONTRAST  Technique:  Multiplanar, multiecho pulse sequences of the brain and surrounding structures were obtained without intravenous contrast. Angiographic images of the head were obtained using MRA technique without contrast.  Comparison:  02/10/2012 CT scan.  MRI HEAD  Findings:  There is no evidence for acute infarction, intracranial hemorrhage, mass lesion, hydrocephalus, or extra-axial fluid. There is mild atrophy with moderate sequelae of chronic microvascular ischemia in the periventricular and subcortical white matter. No foci of chronic hemorrhage.  Pituitary and cerebellar tonsils unremarkable.  No midline shift.  Normal calvarium and skull base.  Upper cervical region negative.  No acute sinus or mastoid disease.  IMPRESSION: Mild atrophy with chronic microvascular ischemic change.  No acute intracranial findings.  Good agreement with prior CT.  MRA HEAD  Findings: Both internal carotid arteries are widely patent with mild nonstenotic irregularity in the cavernous and supraclinoid segments.  No visible upper cervical dissection.  Basilar artery widely patent with vertebrals codominant.  There is no proximal stenosis of the anterior, middle, or posterior cerebral arteries. Mild irregularity of the distal MCA and PCA branches  suggests intracranial atherosclerotic disease.  No visible berry aneurysm.  IMPRESSION: Unremarkable MR angiography intracranial circulation.  No proximal flow reducing lesion or dissection.   Original Report Authenticated By: Staci Righter, M.D.    Labs: Basic Metabolic Panel:  Lab [id number] 0526 02/09/12 2007  NA 138 133*  K 3.9 4.2  CL 103 96  CO2 23 25  GLUCOSE 140* 272*  BUN 17 20  CREATININE 0.81 1.08  CALCIUM 10.0 10.4  MG -- --  PHOS -- --   CBC:  Lab 02/11/12 0526 02/09/12 2007  WBC 8.0 11.6*  NEUTROABS -- --  HGB 12.6 14.3  HCT 36.9 40.5  MCV 82.4 83.2  PLT 231 247   Cardiac Enzymes:  Lab 02/10/12 0520  CKTOTAL 53  CKMB 1.8  CKMBINDEX --  TROPONINI <0.30   CBG:  Lab 02/11/12 1059 02/11/12 0726 02/10/12 2014 02/10/12 1628 02/10/12 1151  GLUCAP 260* 138* 264* 148* 265*    Principal Problem:  *Syncope and collapse Active Problems:  Hypertension  Ataxia  DM (diabetes mellitus), type 2, uncontrolled   Time coordinating discharge: 25 minutes  Signed:  Murray Hodgkins, MD [facility] 02/11/2012, 12:43 PM

## 2014-07-01 ENCOUNTER — Encounter (HOSPITAL_COMMUNITY): Payer: Self-pay | Admitting: Cardiovascular Disease

## 2014-10-08 ENCOUNTER — Encounter: Payer: Medicare Other | Attending: "Endocrinology | Admitting: Nutrition

## 2014-10-08 DIAGNOSIS — IMO0002 Reserved for concepts with insufficient information to code with codable children: Secondary | ICD-10-CM

## 2014-10-08 DIAGNOSIS — Z713 Dietary counseling and surveillance: Secondary | ICD-10-CM | POA: Insufficient documentation

## 2014-10-08 DIAGNOSIS — E118 Type 2 diabetes mellitus with unspecified complications: Secondary | ICD-10-CM | POA: Diagnosis not present

## 2014-10-08 DIAGNOSIS — E1165 Type 2 diabetes mellitus with hyperglycemia: Secondary | ICD-10-CM

## 2014-10-11 NOTE — Patient Instructions (Signed)
Goal: 1. Follow My Plate 2. Eat three balanced meals daily 3. Inject insulin in stomach instead of legs. 4. Increase water intake. 5. Cut out snacks and junk food. 6. Get A1C down to 7% in three months. 7. Increase low carb vegetables and fresh fruits.

## 2014-10-11 NOTE — Progress Notes (Signed)
  Medical Nutrition Therapy:  Appt start time: 1430 end time:  6812.   Assessment:  Primary concerns today: Diabetes. Walk in. Here to see Dr. Dorris Fetch. Here with a family member. LIves with her son and daughter. They do the shopping and cooking. Hasn't eaten lunch. She seems mentally slow and sluggish. Had BS checked by RN and it was 116 mg/dl. Hasn't met with a dietitian before. Meds:  Taking Meformin 500 mg BID and takes 50 units of Lantus daily. Has been injecting insulin in stomach and legs and arms at times. Most recent A1C was 8.2%, down from 13%. Sees Dr. Dorris Fetch.  Due to walk in viist, unable to answer safety questions. Preferred Learning Style:   Auditory  Visual  Hands on  Learning Readiness:   Ready  Change in progress   MEDICATIONS: See list   DIETARY INTAKE:  Eats 2-3 meals per day. No routine. LIkes most foods. Drinks water and diet sodas and unsweet tea. Usual physical activity: not much  Estimated energy needs: 1500 calories 170 g carbohydrates 112 g protein 42 g fat  Progress Towards Goal(s):  In progress.   Nutritional Diagnosis:  NB-1.1 Food and nutrition-related knowledge deficit As related to Diabetes.  As evidenced by A1C 8.2%.    Intervention:  Nutrition counseling on diabetes, meal planning, CHO counting, MY Plate, signs/symptoms and treatment of hyper/hypoglycemia, and prevention of complications and proper administration of insulin in abdomen for best absorption..  Goal: 1. Follow My Plate 2. Eat three balanced meals daily 3. Inject insulin in stomach instead of legs. 4. Increase water intake. 5. Cut out snacks and junk food. 6. Get A1C down to 7% in three months. 7. Increase low carb vegetables and fresh fruits.  Teaching Method Utilized:  Visual Auditory Hands on  Handouts given during visit include:  The Plate Method  The Meal Plan Card  Barriers to learning/adherence to lifestyle change: none  Demonstrated degree of  understanding via:  Teach Back   Monitoring/Evaluation:  Dietary intake, exercise, meal planing, sbg, and body weight in 1 month(s).

## 2014-11-19 ENCOUNTER — Ambulatory Visit: Payer: Medicare HMO | Admitting: Nutrition

## 2015-04-11 ENCOUNTER — Ambulatory Visit: Payer: Self-pay | Admitting: "Endocrinology

## 2016-01-24 ENCOUNTER — Encounter: Payer: Self-pay | Admitting: *Deleted

## 2016-01-25 ENCOUNTER — Encounter: Payer: Self-pay | Admitting: Cardiology

## 2016-01-25 NOTE — Progress Notes (Signed)
Cardiology Office Note   Date:  01/26/2016   ID:  Erica Crosby, DOB 12-13-1945, MRN [id number]  PCP:  Erica Mccreedy, MD  Cardiologist:   Minus Breeding, MD   Chief Complaint  Patient presents with  . Chest Pain      History of Present Illness: Erica Crosby is a 70 y.o. female who presents for evaluation of chest pain. She does have cardiovascular risk factors. I was able to find a catheterization report from 2012 at [facility].  She had LAD 30% stenosis at that time. This was apparently done to evaluate chest discomfort. She is referred here describing chest discomfort. This has been going on for about a year. She describes a constant tightness. She also reports that she has sporadic chest discomfort that she cannot quantify or qualify. She does say that some mid chest discomfort without radiation. Seems to happen at rest. It is not reproducible. It goes away spontaneously. It sounds like it is moderate. She doesn't describe associated nausea vomiting or diaphoresis. She does describe to me a swallowing study which she may have some esophageal dysmotility or delayed gastric emptying. She does not describe any shortness of breath, PND or orthopnea. She doesn't describe palpitations, presyncope or syncope. It sounds like she is very sedentary. She apparently is limited by some joint pains.   Past Medical History:  Diagnosis Date  . Coronary artery disease    Mild plaque 2012  . Diabetes mellitus   . Hypertension   . Neuropathy [facility])     Past Surgical History:  Procedure Laterality Date  . ABSCESS DRAINAGE    . CARPAL TUNNEL RELEASE Right   . CHOLECYSTECTOMY    . KNEE SURGERY Right   . NECK SURGERY     x 2  . SHOULDER SURGERY Left      Current Outpatient Prescriptions  Medication Sig Dispense Refill  . amLODipine (NORVASC) 5 MG tablet Take 5 mg by mouth daily.    Marland Kitchen atorvastatin (LIPITOR) 10 MG tablet Take 10 mg by mouth daily.    . enalapril (VASOTEC) 20 MG tablet Take 40 mg  by mouth daily.    . folic acid (FOLVITE) 1 MG tablet Take 1 mg by mouth daily.    Marland Kitchen gabapentin (NEURONTIN) 400 MG capsule Take 400 mg by mouth daily.     . insulin glargine (LANTUS) 100 UNIT/ML injection Inject 50 Units into the skin every evening.    . metFORMIN (GLUCOPHAGE) 500 MG tablet Take 500 mg by mouth 2 (two) times daily with a meal.    . omega-3 acid ethyl esters (LOVAZA) 1 G capsule Take 1 g by mouth daily.    . pantoprazole (PROTONIX) 40 MG tablet Take 40 mg by mouth daily.    . predniSONE (DELTASONE) 10 MG tablet Take 10 mg by mouth daily with breakfast.    . traMADol (ULTRAM) 50 MG tablet Take by mouth every 6 (six) hours as needed.     No current facility-administered medications for this visit.     Allergies:   Review of patient's allergies indicates no known allergies.    Social History:  The patient  reports that she quit smoking about 3 years ago. Her smoking use included Cigarettes and E-cigarettes. She quit after 25.00 years of use. She has never used smokeless tobacco. She reports that she does not drink alcohol or use drugs.   Family History:  The patient's family history includes Alcohol abuse in her father; CAD (age  of onset: 3) in her sister; Diabetes in her mother; Heart disease in her mother and sister.    ROS:  Please see the history of present illness.   Otherwise, review of systems are positive for diffuse arthritis, insomnia, neuropathy, back pain, constipation.   All other systems are reviewed and negative.    PHYSICAL EXAM: VS:  BP 132/70   Pulse 90   Ht 5\' 7"  (1.702 m)   Wt 136 lb (61.7 kg)   SpO2 98%   BMI 21.30 kg/m  , BMI Body mass index is 21.3 kg/m. GENERAL:  Well appearing HEENT:  Pupils equal round and reactive, fundi not visualized, oral mucosa unremarkable NECK:  No jugular venous distention, waveform within normal limits, carotid upstroke brisk and symmetric, no bruits, no thyromegaly LYMPHATICS:  No cervical, inguinal  adenopathy LUNGS:  Clear to auscultation bilaterally BACK:  No CVA tenderness CHEST:  Unremarkable HEART:  PMI not displaced or sustained,S1 and S2 within normal limits, no S3, no S4, no clicks, no rubs, soft apical nonradiating brief systolic murmur murmurs ABD:  Flat, positive bowel sounds normal in frequency in pitch, no bruits, no rebound, no guarding, no midline pulsatile mass, no hepatomegaly, no splenomegaly EXT:  2 plus pulses throughout, no edema, no cyanosis no clubbing SKIN:  No rashes no nodules NEURO:  Cranial nerves II through XII grossly intact, motor grossly intact throughout PSYCH:  Cognitively intact, oriented to person place and time    EKG:  EKG is ordered today. The ekg ordered today demonstrates sinus rhythm, rate 90, axis within normal limits, intervals within normal limits, no acute ST-T wave changes.   Recent Labs: No results found for requested labs within last 8760 hours.    Lipid Panel    Component Value Date/Time   CHOL 115 02/10/2012 0520   TRIG 87 02/10/2012 0520   HDL 35 (L) 02/10/2012 0520   CHOLHDL 3.3 02/10/2012 0520   VLDL 17 02/10/2012 0520   LDLCALC 63 02/10/2012 0520      Wt Readings from Last 3 Encounters:  01/26/16 136 lb (61.7 kg)  02/10/12 147 lb 11.3 oz (67 kg)      Other studies Reviewed: Additional studies/ records that were reviewed today include: Cath 2012. Review of the above records demonstrates:  Please see elsewhere in the note.     ASSESSMENT AND PLAN:  CHEST PAIN:  Her chest pain is atypical and may be GI. However, she has significant cardiovascular risk factors. She screening with a stress test but would not be a walk on a treadmill. Therefore, she will have a [employer].  HTN:  The blood pressure is at target. No change in medications is indicated. We will continue with therapeutic lifestyle changes (TLC).  MURMUR:  I suspect this is a flow murmur. No further imaging is indicated.   Current medicines  are reviewed at length with the patient today.  The patient does not have concerns regarding medicines.  The following changes have been made:  no change  Labs/ tests ordered today include:   Orders Placed This Encounter  Procedures  . NM Myocar Multi W/Spect W/Wall Motion / EF  . Myocardial Perfusion Imaging  . EKG 12-Lead     Disposition:   FU with me as needed.      Signed, Minus Breeding, MD  01/26/2016 4:45 PM    [facility]

## 2016-01-26 ENCOUNTER — Encounter: Payer: Self-pay | Admitting: *Deleted

## 2016-01-26 ENCOUNTER — Ambulatory Visit (INDEPENDENT_AMBULATORY_CARE_PROVIDER_SITE_OTHER): Payer: Medicare Other | Admitting: Cardiology

## 2016-01-26 ENCOUNTER — Encounter: Payer: Self-pay | Admitting: Cardiology

## 2016-01-26 VITALS — BP 132/70 | HR 90 | Ht 67.0 in | Wt 136.0 lb

## 2016-01-26 DIAGNOSIS — R0789 Other chest pain: Secondary | ICD-10-CM

## 2016-01-26 DIAGNOSIS — R079 Chest pain, unspecified: Secondary | ICD-10-CM | POA: Diagnosis not present

## 2016-01-26 NOTE — Patient Instructions (Signed)
Medication Instructions:  Continue all current medications.  Labwork: none  Testing/Procedures:  Your physician has requested that you have a lexiscan myoview. For further information please visit [email or web address]. Please follow instruction sheet, as given.  Office will contact with results via phone or letter.    Follow-Up: As needed.   Any Other Special Instructions Will Be Listed Below (If Applicable).  If you need a refill on your cardiac medications before your next appointment, please call your pharmacy.

## 2016-02-06 ENCOUNTER — Encounter (HOSPITAL_COMMUNITY)
Admission: RE | Admit: 2016-02-06 | Discharge: 2016-02-06 | Disposition: A | Discharge status: Home / Self Care | Payer: Medicare Other | Source: Ambulatory Visit | Attending: Cardiology | Admitting: Cardiology

## 2016-02-06 ENCOUNTER — Inpatient Hospital Stay (HOSPITAL_COMMUNITY): Admission: RE | Admit: 2016-02-06 | Payer: Medicare Other | Source: Ambulatory Visit

## 2016-02-06 ENCOUNTER — Encounter (HOSPITAL_COMMUNITY): Payer: Self-pay

## 2016-02-06 DIAGNOSIS — R079 Chest pain, unspecified: Secondary | ICD-10-CM

## 2016-02-06 LAB — NM MYOCAR MULTI W/SPECT W/WALL MOTION / EF
CHL CUP RESTING HR STRESS: 69 {beats}/min
CSEPPHR: 96 {beats}/min
LHR: 0.11
LV dias vol: 84 mL (ref 46–106)
LV sys vol: 48 mL
NUC STRESS TID: 1.02

## 2016-02-06 MED ORDER — TECHNETIUM TC 99M TETROFOSMIN IV KIT
10.0000 | PACK | Freq: Once | INTRAVENOUS | Status: AC | PRN
  Administered 2016-02-06: 11 via INTRAVENOUS

## 2016-02-06 MED ORDER — TECHNETIUM TC 99M TETROFOSMIN IV KIT
30.0000 | PACK | Freq: Once | INTRAVENOUS | Status: AC | PRN
  Administered 2016-02-06: 32.6 via INTRAVENOUS

## 2016-02-06 MED ORDER — SODIUM CHLORIDE 0.9% FLUSH
INTRAVENOUS | Status: AC
  Administered 2016-02-06: 10 mL via INTRAVENOUS
  Filled 2016-02-06: qty 10

## 2016-02-06 MED ORDER — REGADENOSON 0.4 MG/5ML IV SOLN
INTRAVENOUS | Status: AC
  Administered 2016-02-06: 0.4 mg via INTRAVENOUS
  Filled 2016-02-06: qty 5

## 2016-02-08 ENCOUNTER — Telehealth: Payer: Self-pay | Admitting: Cardiology

## 2016-02-08 DIAGNOSIS — R079 Chest pain, unspecified: Secondary | ICD-10-CM

## 2016-02-08 NOTE — Telephone Encounter (Signed)
Pt agreeable to echo - orders placed and routed to schedulers

## 2016-02-08 NOTE — Telephone Encounter (Signed)
Patient wanting results from her test

## 2016-02-10 ENCOUNTER — Telehealth: Payer: Self-pay | Admitting: *Deleted

## 2016-02-10 NOTE — Telephone Encounter (Signed)
Pt aware of echo date and time, says she will be here.

## 2016-02-10 NOTE — Telephone Encounter (Signed)
-----   Message from Weston Anna sent at 02/08/2016  5:20 PM EDT ----- 9/13 AT 10:00    ----- Message ----- From: Massie Maroon, CMA Sent: 02/08/2016   4:53 PM To: Weston Anna  Please schedule this pt for echo in [place]. Thank you. I can call pt  Erica Crosby

## 2016-02-29 ENCOUNTER — Other Ambulatory Visit: Payer: Self-pay

## 2016-02-29 ENCOUNTER — Ambulatory Visit (INDEPENDENT_AMBULATORY_CARE_PROVIDER_SITE_OTHER): Payer: Medicare Other

## 2016-02-29 DIAGNOSIS — R079 Chest pain, unspecified: Secondary | ICD-10-CM | POA: Diagnosis not present

## 2016-03-05 ENCOUNTER — Telehealth: Payer: Self-pay | Admitting: *Deleted

## 2016-03-05 NOTE — Telephone Encounter (Signed)
Notes Recorded by Laurine Blazer, LPN on 3/82/5053 at 97:67 AM EDT Patient notified and verbalized understanding. Copy to pmd. ------  Notes Recorded by Minus Breeding, MD on 03/01/2016 at 8:17 PM EDT LV function was normal. Lexiscan Myoview underestimated the EF. No ischemia on that test. No further cardiac testing is indicated. Her murmur is likely a flow murmur.

## 2016-06-22 DIAGNOSIS — E119 Type 2 diabetes mellitus without complications: Secondary | ICD-10-CM | POA: Diagnosis not present

## 2016-06-22 DIAGNOSIS — H40033 Anatomical narrow angle, bilateral: Secondary | ICD-10-CM | POA: Diagnosis not present

## 2016-06-26 LAB — HM DIABETES EYE EXAM

## 2016-07-09 ENCOUNTER — Ambulatory Visit (INDEPENDENT_AMBULATORY_CARE_PROVIDER_SITE_OTHER): Payer: Medicare Other | Admitting: Family Medicine

## 2016-07-09 ENCOUNTER — Encounter (INDEPENDENT_AMBULATORY_CARE_PROVIDER_SITE_OTHER): Payer: Self-pay

## 2016-07-09 ENCOUNTER — Encounter: Payer: Self-pay | Admitting: Family Medicine

## 2016-07-09 VITALS — BP 153/88 | HR 102 | Temp 98.4°F | Ht 67.0 in | Wt 156.0 lb

## 2016-07-09 DIAGNOSIS — Z794 Long term (current) use of insulin: Secondary | ICD-10-CM | POA: Diagnosis not present

## 2016-07-09 DIAGNOSIS — E538 Deficiency of other specified B group vitamins: Secondary | ICD-10-CM | POA: Diagnosis not present

## 2016-07-09 DIAGNOSIS — I10 Essential (primary) hypertension: Secondary | ICD-10-CM

## 2016-07-09 DIAGNOSIS — E1165 Type 2 diabetes mellitus with hyperglycemia: Secondary | ICD-10-CM

## 2016-07-09 DIAGNOSIS — IMO0002 Reserved for concepts with insufficient information to code with codable children: Secondary | ICD-10-CM

## 2016-07-09 DIAGNOSIS — E1121 Type 2 diabetes mellitus with diabetic nephropathy: Secondary | ICD-10-CM | POA: Diagnosis not present

## 2016-07-09 DIAGNOSIS — M069 Rheumatoid arthritis, unspecified: Secondary | ICD-10-CM | POA: Diagnosis not present

## 2016-07-09 MED ORDER — INSULIN DEGLUDEC 100 UNIT/ML ~~LOC~~ SOPN: 20.0000 [IU] | PEN_INJECTOR | Freq: Every day | SUBCUTANEOUS | 5 refills | Status: DC

## 2016-07-09 MED ORDER — FOLIC ACID 1 MG PO TABS: 1.0000 mg | ORAL_TABLET | Freq: Every day | ORAL | 3 refills | Status: DC

## 2016-07-09 NOTE — Progress Notes (Signed)
HPI  Patient presents today here to establish care with diabetes, hypertension, folate deficiency.  Patient needs refill of folic acid.  Patient states that she stopped using Lantus about 6-8 weeks ago due to cost. Cost approximately $200 for her. She's unsure why it was so expensive. She states that since that time she's had uncontrolled hyperglycemia, her fasting blood sugars have been ranging 250-300 She has had fatigue since that time.  He had a mammogram in September, this was repeated due to concerning findings, the repeat was normal.  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  Is a new rheumatoid arthritis diagnosis and has been treated with prolonged prednisone course, she is titrated down to 5 mg currently and has good follow-up in about 2 weeks.  PMH: Smoking status noted, medical history of CAD, diabetes, GERD, hypertension, neuropathy, peptic ulcer Surgical history of carpal tunnel release on the right, right knee surgery, left shoulder surgery Family medical history significant for alcohol abuse with father, sister with CAD at age 86 requiring CABG Social history: Denies alcohol and drug use, former smoker ROS: Per HPI  Objective: BP (!) 153/88   Pulse (!) 102   Temp 98.4 F (36.9 C) (Oral)   Ht '5\' 7"'  (1.702 m)   Wt 156 lb (70.8 kg)   BMI 24.43 kg/m  Gen: NAD, alert, cooperative with exam HEENT: NCAT, oropharynx moist and clear, arcus senilis, PERRLA, EOMI CV: RRR, good S1/S2, no murmur Resp: CTABL, no wheezes, non-labored Abd: SNTND, BS present, no guarding or organomegaly Ext: No edema, warm Neuro: Alert and oriented, No gross deficits  Assessment and plan:  # Uncontrolled type 2 diabetes Patient stopping basal insulin due to cost Fasting labs ordered Expect A1C elevated, plan to treat X 3 months then re-check.   # Blood pressure Elevated today, could be secondary to steroids For now continue enalapril  # Folate deficiency Unclear  etiology, check folate and B 12 Refilled  # Rheumatoid arthritis Managed by rheumatology, currently titrating off of prednisone  Hyperlipidemia Treated with atorvastatin, also has history of TIA    Orders Placed This Encounter  Procedures  . Lipid panel    Standing Status:   Future    Standing Expiration Date:   07/09/2017  . CMP14+EGFR    Standing Status:   Future    Standing Expiration Date:   07/09/2017  . CBC with Differential/Platelet    Standing Status:   Future    Standing Expiration Date:   07/09/2017  . Folate    Standing Status:   Future    Standing Expiration Date:   07/09/2017  . Vitamin B12    Standing Status:   Future    Standing Expiration Date:   07/09/2017    Meds ordered this encounter  Medications  . ergocalciferol (VITAMIN D2) 50000 units capsule    Sig: Take 50,000 Units by mouth every 21 ( twenty-one) days.  Marland Kitchen aspirin EC 81 MG tablet    Sig: Take 81 mg by mouth daily.  . prednisoLONE 5 MG TABS tablet    Sig: Take 5 mg by mouth daily.  . folic acid (FOLVITE) 1 MG tablet    Sig: Take 1 tablet (1 mg total) by mouth daily.    Dispense:  90 tablet    Refill:  3  . insulin degludec (TRESIBA FLEXTOUCH) 100 UNIT/ML SOPN FlexTouch Pen    Sig: Inject 0.2-0.4 mLs (20-40 Units total) into the skin daily at 10 pm.  Dispense:  5 pen    Refill:  Wenden, MD [facility] 07/09/2016, 2:11 PM

## 2016-07-09 NOTE — Patient Instructions (Addendum)
Great to meet you!  Lets see you again in  1 month  We will send your labs on mychart or call within 1 week   Come back in 1 week for labs.   Restart insulin at 20 units a day, slowly go up by 2 units every 3-4 days until your fasting blood sugar is 150-200.

## 2016-07-17 ENCOUNTER — Other Ambulatory Visit: Payer: Medicare Other

## 2016-07-17 DIAGNOSIS — I10 Essential (primary) hypertension: Secondary | ICD-10-CM

## 2016-07-17 DIAGNOSIS — E538 Deficiency of other specified B group vitamins: Secondary | ICD-10-CM | POA: Diagnosis not present

## 2016-07-17 DIAGNOSIS — Z794 Long term (current) use of insulin: Principal | ICD-10-CM

## 2016-07-17 DIAGNOSIS — E1121 Type 2 diabetes mellitus with diabetic nephropathy: Secondary | ICD-10-CM

## 2016-07-17 DIAGNOSIS — E1165 Type 2 diabetes mellitus with hyperglycemia: Secondary | ICD-10-CM | POA: Diagnosis not present

## 2016-07-17 DIAGNOSIS — IMO0002 Reserved for concepts with insufficient information to code with codable children: Secondary | ICD-10-CM

## 2016-07-18 ENCOUNTER — Other Ambulatory Visit: Payer: Self-pay

## 2016-07-18 LAB — CBC WITH DIFFERENTIAL/PLATELET
BASOS: 0 %
Basophils Absolute: 0 10*3/uL (ref 0.0–0.2)
EOS (ABSOLUTE): 0.1 10*3/uL (ref 0.0–0.4)
EOS: 1 %
Hematocrit: 34.9 % (ref 34.0–46.6)
Hemoglobin: 11 g/dL — ABNORMAL LOW (ref 11.1–15.9)
IMMATURE GRANS (ABS): 0 10*3/uL (ref 0.0–0.1)
IMMATURE GRANULOCYTES: 0 %
LYMPHS: 34 %
Lymphocytes Absolute: 2.7 10*3/uL (ref 0.7–3.1)
MCH: 29.4 pg (ref 26.6–33.0)
MCHC: 31.5 g/dL (ref 31.5–35.7)
MCV: 93 fL (ref 79–97)
Monocytes Absolute: 0.3 10*3/uL (ref 0.1–0.9)
Monocytes: 4 %
NEUTROS PCT: 61 %
Neutrophils Absolute: 4.7 10*3/uL (ref 1.4–7.0)
PLATELETS: 271 10*3/uL (ref 150–379)
RBC: 3.74 x10E6/uL — ABNORMAL LOW (ref 3.77–5.28)
RDW: 15.8 % — ABNORMAL HIGH (ref 12.3–15.4)
WBC: 7.8 10*3/uL (ref 3.4–10.8)

## 2016-07-18 LAB — LIPID PANEL
CHOL/HDL RATIO: 2.4 ratio (ref 0.0–4.4)
Cholesterol, Total: 125 mg/dL (ref 100–199)
HDL: 53 mg/dL (ref >39)
LDL Calculated: 57 mg/dL (ref 0–99)
TRIGLYCERIDES: 75 mg/dL (ref 0–149)
VLDL Cholesterol Cal: 15 mg/dL (ref 5–40)

## 2016-07-18 LAB — CMP14+EGFR
ALT: 10 IU/L (ref 0–32)
AST: 12 IU/L (ref 0–40)
Albumin/Globulin Ratio: 1.4 (ref 1.2–2.2)
Albumin: 3.6 g/dL (ref 3.6–4.8)
Alkaline Phosphatase: 95 IU/L (ref 39–117)
BUN/Creatinine Ratio: 13 (ref 12–28)
BUN: 19 mg/dL (ref 8–27)
Bilirubin Total: 0.4 mg/dL (ref 0.0–1.2)
CALCIUM: 9.3 mg/dL (ref 8.7–10.3)
CO2: 23 mmol/L (ref 18–29)
CREATININE: 1.43 mg/dL — AB (ref 0.57–1.00)
Chloride: 99 mmol/L (ref 96–106)
GFR calc Af Amer: 43 mL/min/{1.73_m2} — ABNORMAL LOW (ref >59)
GFR, EST NON AFRICAN AMERICAN: 37 mL/min/{1.73_m2} — AB (ref >59)
GLOBULIN, TOTAL: 2.6 g/dL (ref 1.5–4.5)
Glucose: 411 mg/dL (ref 65–99)
Potassium: 4.3 mmol/L (ref 3.5–5.2)
Sodium: 137 mmol/L (ref 134–144)
TOTAL PROTEIN: 6.2 g/dL (ref 6.0–8.5)

## 2016-07-18 LAB — FOLATE: FOLATE: 18.6 ng/mL (ref >3.0)

## 2016-07-18 LAB — VITAMIN B12: Vitamin B-12: 539 pg/mL (ref 232–1245)

## 2016-07-20 ENCOUNTER — Telehealth: Payer: Self-pay | Admitting: Family Medicine

## 2016-07-20 MED ORDER — INSULIN GLARGINE 100 UNIT/ML SOLOSTAR PEN: 20.0000 [IU] | PEN_INJECTOR | Freq: Every day | SUBCUTANEOUS | 11 refills | Status: DC

## 2016-07-20 NOTE — Telephone Encounter (Signed)
Will send lantus  Laroy Apple, MD [facility] 07/20/2016, 1:18 PM

## 2016-07-20 NOTE — Telephone Encounter (Signed)
-----   Message from Cherre Robins, PharmD sent at 07/20/2016  8:49 AM EST ----- From what I can tell from her formulary: Lantus, Levemir and Toujeo are all tier 3 and would cost $45.  Tyler Aas and Basaglar are non formulary.  NPH is also tier 3 and would be $45 per month.   It appears that she has a $170 deductible to meet at the beginning of the year which may be why the Lantus was [phone]) for the first fill or if the Rx was filled for more than 30 day supply she might be charged 2 copays ($90).  If she is unable to afford the deducible I recommend she apply for extra assistance or low income subsidy.  I can help with this if she needs or her local [facility] office which is in [place].    Tammy ----- Message ----- From: Timmothy Euler, MD Sent: 07/19/2016   8:21 AM To: Cherre Robins, PharmD  Hey Tammy, Can you help me with this patient's situation?  She said that Lantus cost several hundred dollars, now tresiba. I'm sure there has to be something covered. If not I could change to NPH.  Thanks for the help! Sam

## 2016-07-25 DIAGNOSIS — M0510 Rheumatoid lung disease with rheumatoid arthritis of unspecified site: Secondary | ICD-10-CM | POA: Diagnosis not present

## 2016-07-25 DIAGNOSIS — Z79899 Other long term (current) drug therapy: Secondary | ICD-10-CM | POA: Diagnosis not present

## 2016-07-25 DIAGNOSIS — M069 Rheumatoid arthritis, unspecified: Secondary | ICD-10-CM | POA: Diagnosis not present

## 2016-07-25 DIAGNOSIS — R768 Other specified abnormal immunological findings in serum: Secondary | ICD-10-CM | POA: Diagnosis not present

## 2016-08-01 ENCOUNTER — Encounter: Payer: Self-pay | Admitting: Pharmacist

## 2016-08-01 ENCOUNTER — Ambulatory Visit (INDEPENDENT_AMBULATORY_CARE_PROVIDER_SITE_OTHER): Payer: Medicare Other | Admitting: Pharmacist

## 2016-08-01 VITALS — BP 122/68 | HR 74 | Ht 67.0 in | Wt 157.0 lb

## 2016-08-01 DIAGNOSIS — E1149 Type 2 diabetes mellitus with other diabetic neurological complication: Secondary | ICD-10-CM

## 2016-08-01 DIAGNOSIS — R7309 Other abnormal glucose: Secondary | ICD-10-CM | POA: Diagnosis not present

## 2016-08-01 DIAGNOSIS — Z794 Long term (current) use of insulin: Secondary | ICD-10-CM

## 2016-08-01 DIAGNOSIS — IMO0002 Reserved for concepts with insufficient information to code with codable children: Secondary | ICD-10-CM

## 2016-08-01 DIAGNOSIS — E1165 Type 2 diabetes mellitus with hyperglycemia: Secondary | ICD-10-CM | POA: Diagnosis not present

## 2016-08-01 DIAGNOSIS — E1140 Type 2 diabetes mellitus with diabetic neuropathy, unspecified: Secondary | ICD-10-CM | POA: Diagnosis not present

## 2016-08-01 LAB — URINALYSIS
BILIRUBIN UA: NEGATIVE
Ketones, UA: NEGATIVE
NITRITE UA: NEGATIVE
PH UA: 5 (ref 5.0–7.5)
SPEC GRAV UA: 1.015 (ref 1.005–1.030)
Urobilinogen, Ur: 0.2 mg/dL (ref 0.2–1.0)

## 2016-08-01 LAB — BAYER DCA HB A1C WAIVED: HB A1C (BAYER DCA - WAIVED): >14 % — ABNORMAL HIGH (ref <7.0)

## 2016-08-01 LAB — GLUCOSE HEMOCUE WAIVED
Glu Hemocue Waived: >444 mg/dL (ref 65–99)
Glu Hemocue Waived: >444 mg/dL (ref 65–99)

## 2016-08-01 MED ORDER — INSULIN GLARGINE 100 UNIT/ML SOLOSTAR PEN: 50.0000 [IU] | PEN_INJECTOR | Freq: Every day | SUBCUTANEOUS | 0 refills | Status: DC

## 2016-08-01 NOTE — Patient Instructions (Addendum)
Increase Lantus to 50 units once a day  Novolog (orange pen) - Inject as needed for elevated blood glucose  250 to 300 = 3 units 301 to 350 = 4 units 351 to 400 = 5 units 401 or above = 6 units.  Check blood glucose 3 to 4 times a day

## 2016-08-01 NOTE — Progress Notes (Signed)
Patient ID: Erica Crosby, female   DOB: 10/29/1945, 71 y.o.   MRN: [id number]   Subjective:    Erica Crosby is a 71 y.o. female who presents for an initial evaluation of Type 2 diabetes mellitus requiring insulin threapy.  Erica Crosby is new to our practice.  She has transferred because her PCP has retired.  She was diagnosed with DM in 1997.  She has seen Dr Dorris Fetch / endocrinologist in past.   She has recently restarted Lantus about 2 weeks after being without since October 2017 or 3 months.  She was most likely in the Medicare coverage gap and could not afford insulin.  She was given samples of Lantus and Tresiba when she saw Dr. Wendi Snipes and 07/17/16 was started with 20 units daily and has increased over last 2 weeks to 40 units daily.  She is also taking metformin 500mg  bid.    She reports that she checked BG bid but she did not bring her glucometer in today.  Reports BG in the 200 to 300's.   Current symptoms/problems include hyperglycemia and polyuria and have been unchanged. Symptoms have been present for 3 months.  Known diabetic complications: peripheral neuropathy Cardiovascular risk factors: advanced age (older than 2 for men, 5 for women), diabetes mellitus, dyslipidemia and sedentary lifestyle  Eye exam current (within one year): unknown Weight trend: stable Prior visit with CDE: yes - but has been several years ago Current diet: not asked Current exercise: none Medication Compliance?  No   Objective:    BP 122/68   Pulse 74   Ht 5\' 7"  (1.702 m)   Wt 157 lb (71.2 kg)   BMI 24.59 kg/m    A1c = iver 14% (checked today) RBG in office was great than 400  Urinalysis    Component Value Date/Time   COLORURINE YELLOW 02/10/2012 0817   APPEARANCEUR Hazy (A) 08/01/2016 1508   LABSPEC 1.015 02/10/2012 0817   PHURINE 5.0 02/10/2012 0817   GLUCOSEU 1+ (A) 08/01/2016 1508   HGBUR NEGATIVE 02/10/2012 0817   BILIRUBINUR Negative 08/01/2016 1508   KETONESUR NEGATIVE 02/10/2012  0817   PROTEINUR 3+ (A) 08/01/2016 1508   PROTEINUR NEGATIVE 02/10/2012 0817   UROBILINOGEN 1.0 02/10/2012 0817   NITRITE Negative 08/01/2016 1508   NITRITE NEGATIVE 02/10/2012 0817   LEUKOCYTESUR 1+ (A) 08/01/2016 1508      Lab Review Glucose (mg/dL)  Date Value  07/17/2016 411 (>)   Glucose, Bld (mg/dL)  Date Value  02/11/2012 140 (H)  02/09/2012 272 (H)   CO2  Date Value  07/17/2016 23 mmol/L  02/11/2012 23 mEq/L  02/09/2012 25 mEq/L   BUN (mg/dL)  Date Value  07/17/2016 19  02/11/2012 17  02/09/2012 20   Creatinine, Ser (mg/dL)  Date Value  07/17/2016 1.43 (H)  02/11/2012 0.81  02/09/2012 1.08   Assessment:    Diabetes Mellitus type II, under inadequate control.    Plan:    1.  Rx changes:   Patient was given Novolog 10 units SQ times 2 in office (sample used).    She was instructed to check BG tid and to give Novolog according the the following sliding scale until she RTO and we an adjust insulin again:  250 to 300 = 3 units  301 to 350 = 4 units  351 to 400 = 5 units  401 or above = 6 units.   Increase Lantus to 50 units daily - given #2 samples     2.  Education: Reviewed 'ABCs' of diabetes management (respective goals in parentheses):  A1C (<7), blood pressure (<130/80), and cholesterol (LDL <100). 3. Discussed process for applying for LIS (assistance with dedictible, copays and during coverage gap) with Medicare.  Patient is given Medicare and [place] phone numbers.  Once she gathers her financial and Medicare income information she can call or make another appt with me and I will assist her with applying for LIS.    4. Patient requested PAP - appt made 5.  Recommend check BG 3-4 times a day 6.  Recommended increase physical activity - goal is 150 minutes per week 7. Referral sent to [facility] for care worker to assess for community resources for patient.  8.  Follow up: 2 weeks with me - RTO in 1 week for PAP and will also drop of BG to make insulin  adjustment as needed.

## 2016-08-02 ENCOUNTER — Other Ambulatory Visit: Payer: Self-pay | Admitting: Pharmacist

## 2016-08-02 DIAGNOSIS — Z794 Long term (current) use of insulin: Principal | ICD-10-CM

## 2016-08-02 DIAGNOSIS — IMO0002 Reserved for concepts with insufficient information to code with codable children: Secondary | ICD-10-CM

## 2016-08-02 DIAGNOSIS — E1165 Type 2 diabetes mellitus with hyperglycemia: Secondary | ICD-10-CM

## 2016-08-03 ENCOUNTER — Telehealth: Payer: Self-pay | Admitting: Pharmacist

## 2016-08-03 NOTE — Telephone Encounter (Signed)
Left message on patient's VM / ans machine checking on recent BG readings. Asked her to call office with update.

## 2016-08-09 ENCOUNTER — Other Ambulatory Visit: Payer: Self-pay | Admitting: *Deleted

## 2016-08-09 ENCOUNTER — Other Ambulatory Visit: Payer: Self-pay | Admitting: Pediatrics

## 2016-08-09 NOTE — Patient Outreach (Signed)
[place] [facility]) Care Management  08/09/2016  Erica Crosby 1945/11/23 [id number]  MD referral /reasons-medication compliance, assess for community resources, assistance at home, uncontrolled diabetes.  Telephone call to patient; left message on voice mail requesting return call.  Plan: Will follow up.  Sherrin Daisy, RN BSN [place] Management Coordinator [facility] Care Management  [phone]

## 2016-08-13 ENCOUNTER — Other Ambulatory Visit: Payer: Self-pay | Admitting: *Deleted

## 2016-08-13 NOTE — Patient Outreach (Signed)
[place] [facility]) Care Management  08/13/2016  Erica Crosby 25-Oct-1945 [id number]  Telephone call to patient; left messsage on voice mail requesting return call.  Plan: Will follow up.  Sherrin Daisy, RN BSN [place] Management Coordinator [facility] Care Management  [phone]

## 2016-08-15 ENCOUNTER — Encounter: Payer: Self-pay | Admitting: *Deleted

## 2016-08-15 ENCOUNTER — Other Ambulatory Visit: Payer: Self-pay | Admitting: *Deleted

## 2016-08-15 NOTE — Patient Outreach (Signed)
[place] [facility]) Care Management  08/15/2016  Erica Crosby 07-Dec-1945 [id number]  Telephone call attempt x 3; left message on voice mail requesting return call.  Plan:  Geophysicist/field seismologist. Follow up 10 business days.  Sherrin Daisy, RN BSN [place] Management Coordinator [facility] Care Management  [phone]

## 2016-08-16 ENCOUNTER — Ambulatory Visit: Payer: Self-pay | Admitting: Pharmacist

## 2016-08-22 ENCOUNTER — Encounter: Payer: Self-pay | Admitting: Pharmacist

## 2016-08-22 ENCOUNTER — Ambulatory Visit (INDEPENDENT_AMBULATORY_CARE_PROVIDER_SITE_OTHER): Payer: Medicare Other | Admitting: Pharmacist

## 2016-08-22 ENCOUNTER — Ambulatory Visit: Payer: Medicare Other | Admitting: Pharmacist

## 2016-08-22 VITALS — BP 138/72 | HR 72 | Ht 67.0 in | Wt 159.0 lb

## 2016-08-22 DIAGNOSIS — E1165 Type 2 diabetes mellitus with hyperglycemia: Secondary | ICD-10-CM

## 2016-08-22 DIAGNOSIS — E1149 Type 2 diabetes mellitus with other diabetic neurological complication: Secondary | ICD-10-CM | POA: Diagnosis not present

## 2016-08-22 DIAGNOSIS — Z1211 Encounter for screening for malignant neoplasm of colon: Secondary | ICD-10-CM

## 2016-08-22 DIAGNOSIS — E1140 Type 2 diabetes mellitus with diabetic neuropathy, unspecified: Secondary | ICD-10-CM | POA: Diagnosis not present

## 2016-08-22 DIAGNOSIS — R2689 Other abnormalities of gait and mobility: Secondary | ICD-10-CM

## 2016-08-22 DIAGNOSIS — Z794 Long term (current) use of insulin: Secondary | ICD-10-CM

## 2016-08-22 DIAGNOSIS — Z9181 History of falling: Secondary | ICD-10-CM

## 2016-08-22 DIAGNOSIS — Z0000 Encounter for general adult medical examination without abnormal findings: Secondary | ICD-10-CM | POA: Diagnosis not present

## 2016-08-22 DIAGNOSIS — M5126 Other intervertebral disc displacement, lumbar region: Secondary | ICD-10-CM

## 2016-08-22 DIAGNOSIS — M5136 Other intervertebral disc degeneration, lumbar region: Secondary | ICD-10-CM

## 2016-08-22 MED ORDER — BLOOD GLUCOSE MONITOR KIT: PACK | 0 refills | Status: DC

## 2016-08-22 NOTE — Addendum Note (Signed)
Addended by: Cherre Robins on: 08/22/2016 03:30 PM   Modules accepted: Orders

## 2016-08-22 NOTE — Patient Instructions (Addendum)
  Ms. Cappelli , Thank you for taking time to come for your Medicare Wellness Visit. I appreciate your ongoing commitment to your health goals. Please review the following plan we discussed and let me know if I can assist you in the future.   These are the goals we discussed: [place] - call Sherrin Daisy [phone] - they can assess to see if you qualify for home services.   We will review your records form Dr Celesta Gentile and also vaccination records and determine which vaccinations are needed.   We have sent referral for colonoscopy and DEXA (bone density scan) - you will be receiving a call to schedule these appointments.   Change gabapentin 400mg  to take 1 capsule prior to bedtime.   This is a list of the screening recommended for you and due dates:  Health Maintenance  Topic Date Due  .  Hepatitis C: One time screening is recommended by Center for Disease Control  (CDC) for  adults born from [birth year] through 1964.   1945-08-03  . Complete foot exam   Done today  . Tetanus Vaccine  04/12/1966  . Mammogram 01/2017  . Colon Cancer Screening  04/12/1997  . DEXA scan (bone density measurement)  04/12/2012  . Pneumonia vaccines (1 of 2 - PCV13) 04/12/2012  . Hemoglobin A1C  01/29/2017  . Eye exam for diabetics  06/26/2017  . Flu Shot  Completed

## 2016-08-22 NOTE — Progress Notes (Signed)
Patient ID: Erica Crosby, female   DOB: [birth year], 71 y.o.   MRN: [id number]     Subjective:   Erica Crosby is a 71 y.o. female who presents for an Initial Medicare Annual Wellness Visit and to recheck BG.  Erica Crosby is a relatively new patient to our practice.  She has type 2 DM and at our last visit insulin was restarted - Lantus 45 units qd and Novolog prn elevated BG per sliding scale:  250 to 300 = 3 units             301 to 350 = 4 units             351 to 400 = 5 units             401 or above = 6 units.  She reports that BG at home is still in the 300's.    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 Her daughter lives with her.  Patient states she is not usually left at home alone - either rhe daughter or son are there with her. Alcohol/Tobacco/Substances: no currently   Patient reports the following health concerns: She states that she sleeps a lot and is often fatigued during the day.   She also reports that she is afraid of falling due to leg weakness.  Patient walks with the assistance of a cane.  Current Medications (verified) Outpatient Encounter Prescriptions as of 08/22/2016  Medication Sig  . aspirin EC 81 MG tablet Take 81 mg by mouth daily.  Marland Kitchen atorvastatin (LIPITOR) 10 MG tablet Take 10 mg by mouth daily.  . enalapril (VASOTEC) 20 MG tablet Take 40 mg by mouth daily.  . ergocalciferol (VITAMIN D2) 50000 units capsule Take 50,000 Units by mouth every 14 (fourteen) days.   . folic acid (FOLVITE) 1 MG tablet Take 1 tablet (1 mg total) by mouth daily.  Marland Kitchen gabapentin (NEURONTIN) 400 MG capsule Take 400 mg by mouth at bedtime.  . Insulin Glargine (LANTUS SOLOSTAR) 100 UNIT/ML Solostar Pen Inject 50 Units into the skin daily. (Patient taking differently: Inject 45 Units into the skin daily. )  . metFORMIN (GLUCOPHAGE) 500 MG tablet Take 500 mg by mouth 2 (two) times daily with a  meal.  . methotrexate (RHEUMATREX) 2.5 MG tablet Take 4-6 tablets by mouth every 7 (seven) days.  . pantoprazole (PROTONIX) 40 MG tablet Take 40 mg by mouth daily.  . predniSONE (DELTASONE) 5 MG tablet Take 5 mg by mouth daily.  . blood glucose meter kit and supplies KIT Dispense based on patient and insurance preference. Use up to three (3) times daily as directed. (FOR insulin dependent diabetes - E11.79)   No facility-administered encounter medications on file as of 08/22/2016.     Allergies (verified) Patient has no known allergies.   History: Past Medical History:  Diagnosis Date  . Arthritis   . Cataract   . Coronary artery disease    Mild plaque 2012  . Diabetes mellitus   . GERD (gastroesophageal reflux disease)   . Hyperlipidemia   . Hypertension   . Neuropathy ([place])   . Peptic ulcer   . Rheumatoid arthritis [facility])    Past Surgical History:  Procedure Laterality Date  . ABSCESS DRAINAGE    . CARPAL TUNNEL RELEASE Left   . CHOLECYSTECTOMY    . KNEE SURGERY Right   . NECK SURGERY  x 2  . SHOULDER SURGERY Left    Family History  Problem Relation Age of Onset  . Heart disease Sister     Congeital (died age 20) with "enlarged heart"  . CAD Sister   . Diabetes Mother   . Heart disease Mother     Later onset heart disease   . Dementia Mother   . Alcohol abuse Father   . Liver disease Father   . CAD Sister 74    CABG  . Early death Sister   . Diabetes Brother   . Kidney disease Brother     related to DM  . Diabetes Brother    Social History   Occupational History  . Not on file.   Social History Main Topics  . Smoking status: Former Smoker    Years: 25.00    Types: Cigarettes, E-cigarettes    Quit date: 02/02/2013  . Smokeless tobacco: Never Used  . Alcohol use No  . Drug use: No  . Sexual activity: Not Currently    Birth control/ protection: Post-menopausal    Do you feel safe at home?  Yes  Dietary issues and exercise activities: Current  Exercise Habits: The patient does not participate in regular exercise at present  Current Dietary habits:  Eats 2 meals per day and 2 snacks She had increased her intake of green vegetables over the last 2 weeks.  She reports that she is drinking goats milk.  Limiting sugar and bread but not counting CHOs   Objective:    Today's Vitals   08/22/16 1438  BP: 138/72  Pulse: 72  Weight: 159 lb (72.1 kg)  Height: '5\' 7"'  (1.702 m)  PainSc: 4   PainLoc: Leg   Body mass index is 24.9 kg/m.   RBG in office today was 191  Diabetic Foot Form - Detailed   Diabetic Foot Exam - detailed Diabetic Foot exam was performed with the following findings:  Yes 08/22/2016  3:18 PM  Visual Foot Exam completed.:  Yes  Is there a history of foot ulcer?:  No Can the patient see the bottom of their feet?:  Yes Are the shoes appropriate in style and fit?:  Yes Is there swelling or and abnormal foot shape?:  No Are the toenails long?:  No Are the toenails thick?:  No Do you have pain in calf while walking?:  Yes Is there a claw toe deformity?:  No Is there elevated skin temparature?:  No Is there limited skin dorsiflexion?:  No Is there foot or ankle muscle weakness?:  No Are the toenails ingrown?:  No Normal Range of Motion:  Yes Pulse Foot Exam completed.:  Yes  Right posterior Tibialias:  Diminished Left posterior Tibialias:  Diminished  Right Dorsalis Pedis:  Diminished, Present Left Dorsalis Pedis:  Diminished, Present  Sensory Foot Exam Completed.:  Yes Swelling:  No Semmes-Weinstein Monofilament Test R Foot Test Control:  Pos L Foot Test Control:  Pos  R Site 1-Great Toe:  Pos L Site 1-Great Toe:  Pos  R Site 4:  Neg L Site 4:  Neg  R Site 5:  Neg L Site 5:  Neg         Activities of Daily Living In your present state of health, do you have any difficulty performing the following activities: 08/22/2016  Hearing? N  Vision? N  Difficulty concentrating or making decisions? N  Walking or  climbing stairs? Y  Dressing or bathing? N  Doing errands, shopping? N  Preparing Food and eating ? Y  Using the Toilet? N  In the past six months, have you accidently leaked urine? N  Do you have problems with loss of bowel control? N  Managing your Medications? Y  Managing your Finances? N  Housekeeping or managing your Housekeeping? Y  Some recent data might be hidden     Cardiac Risk Factors include: advanced age (>25mn, >>74women);diabetes mellitus;dyslipidemia;family history of premature cardiovascular disease;female gender;sedentary lifestyle  Depression Screen PHQ 2/9 Scores 08/22/2016 08/01/2016 07/09/2016  PHQ - 2 Score 4 0 0  PHQ- 9 Score 7 - -     Fall Risk Fall Risk  08/22/2016 08/01/2016 07/09/2016  Falls in the past year? Yes Yes Yes  Number falls in past yr: 2 or more 2 or more 2 or more  Injury with Fall? No No No  Risk Factor Category  High Fall Risk High Fall Risk -  Follow up Falls prevention discussed - -    Cognitive Function: MMSE - Mini Mental State Exam 08/22/2016  Orientation to time 4  Orientation to Place 5  Registration 3  Attention/ Calculation 0  Recall 3  Language- name 2 objects 2  Language- repeat 1  Language- follow 3 step command 3  Language- read & follow direction 1  Write a sentence 1  Copy design 1  Total score 24    Immunizations and Health Maintenance Immunization History  Administered Date(s) Administered  . Influenza,inj,Quad PF,36+ Mos 03/18/2016   Health Maintenance Due  Topic Date Due  . Hepatitis C Screening  131-May-1947 . FOOT EXAM  04/12/1957  . TETANUS/TDAP  04/12/1966  . MAMMOGRAM  04/12/1997  . COLONOSCOPY  04/12/1997  . DEXA SCAN  04/12/2012  . PNA vac Low Risk Adult (1 of 2 - PCV13) 04/12/2012    Patient Care Team: STimmothy Euler MD as PCP - General (Family Medicine) LDickie La RN as TMarinetteManagement TValinda Party MD as Consulting Physician (Rheumatology)  Indicate any  recent Medical Services you may have received from other than [facility] providers in the past year (date may be approximate).    Assessment:    Annual Wellness Visit  High fall risk and poor balance Uncontrolled type 2 DM   Screening Tests Health Maintenance  Topic Date Due  . Hepatitis C Screening  103/22/1947 . FOOT EXAM  04/12/1957  . TETANUS/TDAP  04/12/1966  . MAMMOGRAM  04/12/1997  . COLONOSCOPY  04/12/1997  . DEXA SCAN  04/12/2012  . PNA vac Low Risk Adult (1 of 2 - PCV13) 04/12/2012  . HEMOGLOBIN A1C  01/29/2017  . OPHTHALMOLOGY EXAM  06/26/2017  . INFLUENZA VACCINE  Completed        Plan:   During the course of the visit [place] educated and counseled about the following appropriate screening and preventive services:   Vaccines to include Pneumoccal, Influenza, Td, Zostavax - will review records form previous PCP and [facility]  Colorectal cancer screening - referral sent  Cardiovascular disease screening - EKG and ECHO 2017  Lipids at goal when last checked 06/2016  BP at goal today  Diabetes  - placed continuous glucose monitor today  to try to get better understanding of BG trends.  Patient educated about proper care of CGM and site.  CGM was placed on back of upper left arm.  Patient will wear at least 5 days and up to 14 days.  She is to keep BG, diet and  insulin administration records. She can engage in regular activities with special care when showering, changing clothes and swimming (only up to 3 feet and for 30 minutes at a time)  Patient given phone number to nurse with [facility].  Recommended she contact to set up appt for assessment for qualification of community services  Bone Denisty / Osteoporosis Screening - will do 2 weeks when CGM removed.  Mammogram - was [facility] 01/2016, will  Request records  PAP - appt 08/24/16  Glaucoma screening / Diabetic Eye Exam - UTD  Nutrition counseling - discussed importance of small regular meals to prevent  hypoglycemia and better regulate BG.   Change gabapentin 457m from am to qhs - may help with daytime drowsiness  Referral to PT for balance assessment / fall prevention  Advanced Directives - information provided  RTC in 2 weeks to remove CGM and for DEXA    Patient Instructions (the written plan) were given to the patient.   TCherre Robins PharmD   08/22/2016

## 2016-08-22 NOTE — Addendum Note (Signed)
Addended by: Cherre Robins on: 08/22/2016 09:53 PM   Modules accepted: Orders, Level of Service

## 2016-08-23 ENCOUNTER — Ambulatory Visit: Payer: Self-pay | Admitting: Pharmacist

## 2016-08-24 ENCOUNTER — Encounter: Payer: Self-pay | Admitting: Pediatrics

## 2016-08-24 ENCOUNTER — Ambulatory Visit (INDEPENDENT_AMBULATORY_CARE_PROVIDER_SITE_OTHER): Payer: Medicare Other | Admitting: Pediatrics

## 2016-08-24 VITALS — BP 162/90 | HR 108 | Temp 97.6°F | Ht 67.0 in | Wt 163.0 lb

## 2016-08-24 DIAGNOSIS — B379 Candidiasis, unspecified: Secondary | ICD-10-CM

## 2016-08-24 DIAGNOSIS — Z0000 Encounter for general adult medical examination without abnormal findings: Secondary | ICD-10-CM | POA: Diagnosis not present

## 2016-08-24 DIAGNOSIS — R300 Dysuria: Secondary | ICD-10-CM | POA: Diagnosis not present

## 2016-08-24 DIAGNOSIS — I10 Essential (primary) hypertension: Secondary | ICD-10-CM

## 2016-08-24 DIAGNOSIS — N898 Other specified noninflammatory disorders of vagina: Secondary | ICD-10-CM

## 2016-08-24 DIAGNOSIS — N3090 Cystitis, unspecified without hematuria: Secondary | ICD-10-CM

## 2016-08-24 LAB — URINALYSIS, COMPLETE
Bilirubin, UA: NEGATIVE
Ketones, UA: NEGATIVE
Leukocytes, UA: NEGATIVE
NITRITE UA: POSITIVE — AB
Specific Gravity, UA: 1.01 (ref 1.005–1.030)
UUROB: 0.2 mg/dL (ref 0.2–1.0)
pH, UA: 5 (ref 5.0–7.5)

## 2016-08-24 LAB — MICROSCOPIC EXAMINATION: Epithelial Cells (non renal): >10 /hpf — AB (ref 0–10)

## 2016-08-24 LAB — WET PREP FOR TRICH, YEAST, CLUE
CLUE CELL EXAM: NEGATIVE
Trichomonas Exam: NEGATIVE
Yeast Exam: POSITIVE — AB

## 2016-08-24 MED ORDER — CIPROFLOXACIN HCL 250 MG PO TABS: 250.0000 mg | ORAL_TABLET | Freq: Two times a day (BID) | ORAL | 0 refills | Status: DC

## 2016-08-24 NOTE — Progress Notes (Signed)
  Subjective:   Patient ID: Erica Crosby, female    DOB: Oct 04, 1945, 71 y.o.   MRN: [id number] CC: gynecologic exam HPI: Erica Crosby is a 71 y.o. female presenting for Gynecologic Exam  Gets irritated in vaginal area at times Sometimes has burning with urination Occasional incontinence Small amounts of vaginal discharge at times No fevers Appetite has been fine Not sexually active for many years she says No fevers Thinks last pap smear was within last five years but doesn't know when Says it was normal, is not sure if getting regular screening before that  Mammo: done in [place] at [place] within the last year Colonoscopy: referred for colonoscopy at recent visit  Takes prednisone every day for arthritis, followed by Dr. Syad/Rheumatology. Has been trying to find alternate medication per pt  Relevant past medical, surgical, family and social history reviewed. Allergies and medications reviewed and updated. History  Smoking Status  . Former Smoker  . Years: 25.00  . Types: Cigarettes, E-cigarettes  . Quit date: 02/02/2013  Smokeless Tobacco  . Never Used   ROS: All systems negative other than what is in HPI  Objective:    BP (!) 162/90   Pulse (!) 108   Temp 97.6 F (36.4 C) (Oral)   Ht 5\' 7"  (1.702 m)   Wt 163 lb (73.9 kg)   BMI 25.53 kg/m   Wt Readings from Last 3 Encounters:  08/24/16 163 lb (73.9 kg)  08/22/16 159 lb (72.1 kg)  08/01/16 157 lb (71.2 kg)    Gen: NAD, alert, cooperative with exam, NCAT EYES: EOMI, no conjunctival injection, or no icterus ENT:  TMs pearly gray b/l, OP without erythema LYMPH: no cervical LAD CV: slightly tachycardic, regular rhythm, normal S1/S2, no murmur, distal pulses 2+ b/l Resp: CTABL, no wheezes, normal WOB Abd: +BS, soft, NTND.  Ext: No edema, warm Neuro: Alert and oriented MSK: normal muscle bulk Psych: normal affect GU: normal external female genitalia, normal appearing cervix, minimal white discharge present vaginal  vault  Assessment & Plan:  Adalai was seen today for gynecologic exam.  Diagnoses and all orders for this visit:  Encounter for preventive health examination Mammogram done in [place] within past year Colonoscopy: has referral in Pap smear: cotesting today -     Pap IG and HPV (high risk) DNA detection  Dysuria -     WET PREP FOR TRICH, YEAST, CLUE  Vaginal discharge UA positive, will treat for cystitis as below, f/u culture -     Urinalysis, Complete -     Urine culture  Cystitis -     ciprofloxacin (CIPRO) 250 MG tablet; Take 1 tablet (250 mg total) by mouth 2 (two) times daily.  Yeast infection Wet prep positive for yeast, treat with below -     fluconazole (DIFLUCAN) 150 MG tablet; Take 1 tablet (150 mg total) by mouth once.  Essential hypertension Elevated today, improved numbers at other recent visits Cont current meds Check at home, write numbers  Follow up in 6 weeks  Other orders -     Microscopic Examination   Follow up plan: Return in about 6 weeks (around 10/05/2016) for With Dr. Wendi Snipes for med follow up. Assunta Found, MD [place] .

## 2016-08-24 NOTE — Patient Instructions (Signed)
Check blood pressures at home every day Bring numbers to next clinic visit

## 2016-08-25 MED ORDER — FLUCONAZOLE 150 MG PO TABS: 150.0000 mg | ORAL_TABLET | Freq: Once | ORAL | 0 refills | Status: AC

## 2016-08-27 LAB — URINE CULTURE

## 2016-08-28 LAB — PAP IG AND HPV HIGH-RISK
HPV, high-risk: NEGATIVE
PAP Smear Comment: 0

## 2016-08-29 ENCOUNTER — Encounter: Payer: Self-pay | Admitting: Physical Therapy

## 2016-08-29 ENCOUNTER — Ambulatory Visit: Payer: Medicare Other | Attending: Pediatrics | Admitting: Physical Therapy

## 2016-08-29 DIAGNOSIS — R262 Difficulty in walking, not elsewhere classified: Secondary | ICD-10-CM | POA: Insufficient documentation

## 2016-08-29 DIAGNOSIS — M6281 Muscle weakness (generalized): Secondary | ICD-10-CM | POA: Insufficient documentation

## 2016-08-29 NOTE — Therapy (Addendum)
[facility] [place], [place], [address] Phone: [phone]   Fax:  [phone]  Physical Therapy Evaluation  Patient Details  Name: Erica Crosby MRN: [id number] Date of Birth: 14-Mar-1946 Referring Provider: Assunta Found  Encounter Date: 08/29/2016      PT End of Session - 08/29/16 1146    Visit Number 1   Number of Visits 8   Date for PT Re-Evaluation 10/24/16   PT Start Time 1115   PT Stop Time 1145   PT Time Calculation (min) 30 min   Activity Tolerance Patient limited by fatigue   Behavior During Therapy [facility] for tasks assessed/performed      Past Medical History:  Diagnosis Date  . Arthritis   . Cataract   . Coronary artery disease    Mild plaque 2012  . Diabetes mellitus   . GERD (gastroesophageal reflux disease)   . Hyperlipidemia   . Hypertension   . Neuropathy ([place])   . Peptic ulcer   . Rheumatoid arthritis [facility])     Past Surgical History:  Procedure Laterality Date  . ABSCESS DRAINAGE    . CARPAL TUNNEL RELEASE Left   . CHOLECYSTECTOMY    . KNEE SURGERY Right   . NECK SURGERY     x 2  . SHOULDER SURGERY Left     There were no vitals filed for this visit.       Subjective Assessment - 08/29/16 1119    Subjective Pt has had 2 "hard" falls in the past 3 months. Falls at home when not using her cane. 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 Yes   Pain Score 3    Pain Location Buttocks   Pain Orientation Left   Pain Descriptors / Indicators Sore   Pain Onset 1 to 4 weeks ago            [facility] PT Assessment - 08/29/16 0001      Assessment   Medical Diagnosis high risk for falls, balance problems   Referring Provider vincent, carol     Balance Screen   Has the patient fallen in the past 6 months Yes   How many times?  3   Has the patient had a decrease in activity level because of a fear of falling?  Yes   Is the patient reluctant to leave their home because of a fear of falling?  Yes     [place] residence   Home Access Stairs to enter   Entrance Stairs-Number of Steps 4   Entrance Stairs-Rails Right     Prior Function   Level of Independence Independent with household mobility without device;Independent with community mobility with device     Posture/Postural Control   Posture Comments increased lordosis     ROM / Strength   AROM / PROM / Strength Strength     Strength   Overall Strength Comments hip flex 3/5 bilat, knee ext 4/5 bilat, ankle PF/DF 4/5 bilat     Balance   Balance Assessed Yes     Standardized Balance Assessment   Standardized Balance Assessment Berg Balance Test     Berg Balance Test   Sit to Stand Able to stand  independently using hands   Standing Unsupported Able to stand  2 minutes with supervision   Sitting with Back Unsupported but Feet Supported on Floor or Stool Able to sit safely and securely 2 minutes   Stand to Sit Uses backs of legs against chair to control descent   Transfers Able to transfer safely, definite need of hands   Standing Unsupported with Eyes Closed Able to stand 10 seconds with supervision   Standing Ubsupported with Feet Together Able to place feet together independently and stand for 1 minute with supervision   From Standing, Reach Forward with Outstretched Arm Reaches forward but needs supervision   From Standing Position, Pick up Object from [place] to pick up shoe, needs supervision   From Standing Position, Turn to Look Behind Over each Shoulder Needs supervision when turning   Turn 360 Degrees Able to turn 360 degrees safely but slowly   Standing Unsupported, Alternately Place Feet on Step/Stool Able to complete >2 steps/needs minimal assist   Standing Unsupported, One Foot in [place] to take  small step independently and hold 30 seconds   Standing on One Leg Tries to lift leg/unable to hold 3 seconds but remains standing independently   Total Score 32   Berg comment: difficulty with single leg stance                                PT Long Term Goals - 09-09-2016 1150      PT LONG TERM GOAL #1   Title Pt will be independent with HEP   Time 8   Period Weeks   Status New     PT LONG TERM GOAL #2   Title Pt will improve Berg balance score to 45/56 to demo improved balance   Time 8   Period Weeks   Status New     PT LONG TERM GOAL #3   Title Pt will be able to gait safely with appropriate assistive device in home and controlled enviornments   Time 8   Period Weeks   Status New               Plan - September 09, 2016 1146    Clinical Impression Statement Pt presents wtih impaired balance and history of multiple falls. Pt states in evaluation that "I can't do this, I'm too old to exercise".  Pt also states that "everyone does everything for me" at home, so limited motivation to participate in physical therapy.  Pt states she is interested in trying a RW but refuses on eval due to fatigue. Pt with balance deficits as shown by 32/56 on Berg.   Rehab Potential Good   PT Frequency 1x / week   PT Duration 8 weeks   PT Treatment/Interventions ADLs/Self Care Home Management;Cryotherapy;Electrical Stimulation;DME Instruction;Balance training;Passive range of motion;Orthotic Fit/Training;Therapeutic exercise;Moist Heat;Gait training;Stair training;Functional mobility training;Therapeutic activities;Patient/family education;Neuromuscular re-education;Dry needling;Manual techniques;Taping   PT Next Visit Plan assess mobility with RW, try strengthening exercises   PT Home Exercise Plan pt declined HEP   Consulted and Agree with Plan of Care Patient      Patient will benefit from skilled therapeutic intervention in order to improve the following deficits and  impairments:  Difficulty walking, Decreased strength, Decreased endurance, Decreased activity tolerance, Decreased balance, Decreased knowledge of use of DME, Pain, Decreased mobility  Visit Diagnosis: Difficulty in walking, not elsewhere classified  Muscle weakness (generalized)      G-Codes - Sep 09, 2016 1152    Functional Assessment Tool Used (Outpatient  Only) berg balance test, clinical judgement   Functional Limitation Mobility: Walking and moving around   Mobility: Walking and Moving Around Current Status [phone]) At least 20 percent but less than 40 percent impaired, limited or restricted   Mobility: Walking and Moving Around Goal Status [phone]) At least 1 percent but less than 20 percent impaired, limited or restricted       Problem List Patient Active Problem List   Diagnosis Date Noted  . Bulging lumbar disc 08/22/2016  . Diabetes mellitus ([place]) 02/10/2012  . Hypertension 02/10/2012  . TIA (transient ischemic attack) 02/10/2012  . Ataxia 02/10/2012  . DM (diabetes mellitus), type 2, uncontrolled ([place]) 02/10/2012    Isabelle Course, PT, DPT 08/29/2016, 11:53 AM  [facility] [address] [place], [place], [address] Phone: [phone]   Fax:  [phone]  Name: YONG WAHLQUIST MRN: [id number] Date of Birth: May 07, 1946  PHYSICAL THERAPY DISCHARGE SUMMARY  Visits from Start of Care: 1.  Current functional level related to goals / functional outcomes: See above.   Remaining deficits: Pt did not return.   Education / Equipment:  Plan: Patient agrees to discharge.  Patient goals were not met. Patient is being discharged due to not returning since the last visit.  ?????         Mali Applegate MPT

## 2016-09-06 ENCOUNTER — Ambulatory Visit (INDEPENDENT_AMBULATORY_CARE_PROVIDER_SITE_OTHER): Payer: Medicare Other | Admitting: Pharmacist

## 2016-09-06 VITALS — BP 134/82 | HR 70 | Ht 67.0 in | Wt 162.5 lb

## 2016-09-06 DIAGNOSIS — Z794 Long term (current) use of insulin: Secondary | ICD-10-CM | POA: Diagnosis not present

## 2016-09-06 DIAGNOSIS — E1140 Type 2 diabetes mellitus with diabetic neuropathy, unspecified: Secondary | ICD-10-CM | POA: Diagnosis not present

## 2016-09-06 DIAGNOSIS — E1165 Type 2 diabetes mellitus with hyperglycemia: Secondary | ICD-10-CM | POA: Diagnosis not present

## 2016-09-06 DIAGNOSIS — IMO0002 Reserved for concepts with insufficient information to code with codable children: Secondary | ICD-10-CM

## 2016-09-06 NOTE — Patient Instructions (Signed)
Erica Crosby / Lantus - inject 40 units once a day.    Hypoglycemia Hypoglycemia occurs when the level of sugar (glucose) in the blood is too low. Glucose is a type of sugar that provides the body's main source of energy. Certain hormones (insulin and glucagon) control the level of glucose in the blood. Insulin lowers blood glucose, and glucagon increases blood glucose. Hypoglycemia can result from having too much insulin in the bloodstream, or from not eating enough food that contains glucose. Hypoglycemia can happen in people who do or do not have diabetes. It can develop quickly, and it can be a medical emergency. What are the causes? Hypoglycemia occurs most often in people who have diabetes. If you have diabetes, hypoglycemia may be caused by:  Diabetes medicine. More likely to occur with insulin.  Not eating enough, or not eating often enough.  Increased physical activity.  Drinking alcohol, especially when you have not eaten recently. What increases the risk? Hypoglycemia is more likely to develop in:  People who have diabetes and take medicines to lower blood glucose.  People who abuse alcohol.  People who have a severe illness. What are the signs or symptoms? Hypoglycemia may not cause any symptoms. If you have symptoms, they may include:  Hunger.  Anxiety.  Sweating and feeling clammy.  Confusion.  Dizziness or feeling light-headed.  Sleepiness.  Nausea.  Increased heart rate.  Headache.  Blurry vision.  Seizure.  Nightmares.  Tingling or numbness around the mouth, lips, or tongue.  A change in speech.  Decreased ability to concentrate.  A change in coordination.  Restless sleep.  Tremors or shakes.  Fainting.  Irritability. How is this treated Low Blood glucose (Blood glucose less than 70)? This condition can often be treated by immediately eating or drinking something that contains glucose / sugar, such as:  3-4 sugar tablets  (glucose pills).  Glucose gel, 15-gram tube.  Fruit juice, 4 oz (120 mL).  Regular soda (not diet soda), 4 oz (120 mL).  Low-fat milk, 4 oz (120 mL).  Several pieces of hard candy.  Sugar or honey, 1 Tbsp. Treating Hypoglycemia If You Have Diabetes  If you are alert and able to swallow safely, follow the 15:15 rule:  Take 15 grams of a rapid-acting carbohydrate.  Rapid-acting options include:  1 tube of glucose gel.  3 glucose pills.  6-8 pieces of hard candy.  4 oz (120 mL) of fruit juice.  4 oz (120 ml) of regular (not diet) soda.  Check your blood glucose 15 to 20 minutes after you take the carbohydrate.  If the repeat blood glucose level is still at or below 70 mg/dL (3.9 mmol/L), take 15 grams of a carbohydrate again.  If your blood glucose level does not increase above 70 mg/dL (3.9 mmol/L) after 3 tries, seek emergency medical care.  After your blood glucose level returns to normal, eat a meal or a snack within 1 hour. Treating Severe Hypoglycemia  Severe hypoglycemia is when your blood glucose level is at or below 54 mg/dL (3 mmol/L). Severe hypoglycemia is an emergency. Do not wait to see if the symptoms will go away. Get medical help right away. Call your local emergency services (911 in the [place] Do not drive yourself to the hospital. If you have severe hypoglycemia and you cannot eat or drink, you may need an injection of glucagon. A family member or close friend should learn how to check your blood glucose and how to give you  a glucagon injection. Ask your health care provider if you need to have an emergency glucagon injection kit available. Severe hypoglycemia may need to be treated in a hospital. The treatment may include getting glucose through an IV tube. You may also need treatment for the cause of your hypoglycemia. Follow these instructions at home: General instructions  Avoid any diets that cause you to not eat enough food. Talk with your health care  provider before you start any new diet.  Take over-the-counter and prescription medicines only as told by your health care provider.  Limit alcohol intake to no more than 1 drink per day for nonpregnant women and 2 drinks per day for men. One drink equals 12 oz of beer, 5 oz of wine, or 1 oz of hard liquor.  Keep all follow-up visits as told by your health care provider. This is important. If You Have Diabetes:   Make sure you know the symptoms of hypoglycemia.  Always have a rapid-acting carbohydrate snack with you to treat low blood sugar.  Follow your diabetes management plan, as told by your health care provider. Make sure you:  Take your medicines as directed.  Follow your exercise plan.  Follow your meal plan. Eat on time, and do not skip meals.  Check your blood glucose as often as directed. Make sure to check your blood glucose before and after exercise. If you exercise longer or in a different way than usual, check your blood glucose more often.  Follow your sick day plan whenever you cannot eat or drink normally. Make this plan in advance with your health care provider.  Share your diabetes management plan with people in your workplace, school, and household.  Check your urine for ketones when you are ill and as told by your health care provider.  Carry a medical alert card or wear medical alert jewelry. Contact a health care provider if:  You have problems keeping your blood glucose in your target range.  You have frequent episodes of hypoglycemia. (more than 1 episode per week) Get help right away if:  You continue to have hypoglycemia symptoms after eating or drinking something containing glucose.  Your blood glucose is at or below 54 mg/dL (3 mmol/L).  You have a seizure.  You faint. These symptoms may represent a serious problem that is an emergency. Do not wait to see if the symptoms will go away. Get medical help right away. Call your local emergency  services (911 in the [place] Do not drive yourself to the hospital.  This information is not intended to replace advice given to you by your health care provider. Make sure you discuss any questions you have with your health care provider.

## 2016-09-06 NOTE — Progress Notes (Signed)
Patient ID: [name], female   DOB: [birth year], 71 y.o.   MRN: [id number]      Subjective:    Erica Crosby is a 71 y.o. female who presents for a re-evaluation of Type 2 diabetes mellitus requiring insulin threapy and to removed and download 14 day CGM.   Erica Crosby is fairly new to our office. She transferredin January 2018 because her PCP has retired.  She was diagnosed with DM in 1997.  She has seen Dr Dorris Fetch / endocrinologist in past.    She mentions today that she has been out of Lantus/Tresiba for about 7 to 10 days.  She reports that she has been compliant with taking metformin 500mg  bid.    She checks HBG bid but she did not bring in glucometer today.  Last A1c was over 14% (08/01/2016)  Continuous Glucose Monitor Report:  Average BG = 307 Estimated A1c = 12.3% Daily Glucose Summary shows BG over goals - in 200-300 range most days.  2 episodes of hypoglycemia: 08/25/16 - hypoglycemia occurred around 9:30am and lasted about 2.5 hours 08/26/16 - hypoglycemia occurred around 10am and lasted about 8 hours Patient reports that she did not feel these events.  Current symptoms/problems include hyperglycemia and polyuria and have been unchanged. Symptoms have been present for 3 months.  Known diabetic complications: peripheral neuropathy Cardiovascular risk factors: advanced age (older than 77 for men, 47 for women), diabetes mellitus, dyslipidemia and sedentary lifestyle  Eye exam current (within one year): yes Weight trend: stable Prior visit with CDE:  Current diet: in general, an "unhealthy" diet - patient reports that her "kryptonite" is fried chicken.  She has not been following CHO counting diet.  She notes lack of motivation and that she gets frustrated with her children telling her what to do as barriers to adhering to recommended diet.  Current exercise: none Medication Compliance?  No   Objective:    BP 134/82   Pulse 70   Ht 5\' 7"  (1.702 m)   Wt 162 lb 8 oz (73.7  kg)   BMI 25.45 kg/m    A1c = over 14% (08/01/2016)  Urinalysis    Component Value Date/Time   COLORURINE YELLOW 02/10/2012 0817   APPEARANCEUR Clear 08/24/2016 1636   LABSPEC 1.015 02/10/2012 0817   PHURINE 5.0 02/10/2012 0817   GLUCOSEU 3+ (A) 08/24/2016 1636   HGBUR NEGATIVE 02/10/2012 0817   BILIRUBINUR Negative 08/24/2016 1636   KETONESUR NEGATIVE 02/10/2012 0817   PROTEINUR 2+ (A) 08/24/2016 1636   PROTEINUR NEGATIVE 02/10/2012 0817   UROBILINOGEN 1.0 02/10/2012 0817   NITRITE Positive (A) 08/24/2016 1636   NITRITE NEGATIVE 02/10/2012 0817   LEUKOCYTESUR Negative 08/24/2016 1636      Lab Review Glucose (mg/dL)  Date Value  07/17/2016 411 (>)   Glucose, Bld (mg/dL)  Date Value  02/11/2012 140 (H)  02/09/2012 272 (H)   CO2  Date Value  07/17/2016 23 mmol/L  02/11/2012 23 mEq/L  02/09/2012 25 mEq/L   BUN (mg/dL)  Date Value  07/17/2016 19  02/11/2012 17  02/09/2012 20   Creatinine, Ser (mg/dL)  Date Value  07/17/2016 1.43 (H)  02/11/2012 0.81  02/09/2012 1.08   Assessment:    Diabetes Mellitus type II, under inadequate control.    Plan:    1.  Rx changes:   Restart insulin - given #2 Tresiba samples - 40 units daily ( I am not increasing dose at this time as I feel that if patient will  take consistently her BG will improve and I am cautious due to 2 hypoglycemia episodes.   Continue metformin 500mg  bid  2.  Education: Reviewed BG goals with patient and also discussed the possible complications of uncontrolled DM.  Patient expressed that she wishes to remain as independent as possible.  Reinforced that to do this she has to take medications everyday and as prescribed in order to prevention DM complications. 3.  Recommended check BG 3-4 times a day prior to meals and at bedtime  4.  Continue with plan to start physical therapy for balance assessment of strengthening 5.  Follow up: PCP in 1 month.  I will follow up by phone in 1-2 weeks and then will  see patient back in office after appt with Dr Wendi Snipes.

## 2016-09-07 ENCOUNTER — Encounter: Payer: Self-pay | Admitting: Pharmacist

## 2016-09-11 ENCOUNTER — Other Ambulatory Visit: Payer: Self-pay | Admitting: *Deleted

## 2016-09-11 ENCOUNTER — Encounter: Payer: Self-pay | Admitting: *Deleted

## 2016-09-11 NOTE — Patient Outreach (Signed)
[facility]) Care Management  09/11/2016  Shernita Rabinovich [place] [birth year] [id number]   Unsuccessful call attempts x 3.  No response to outreach letter.  Plan: Close case. Send MD closure letter.  Sherrin Daisy, RN BSN [place] Management Coordinator [facility] Care Management  [phone]

## 2016-09-19 ENCOUNTER — Other Ambulatory Visit: Payer: Self-pay

## 2016-09-19 ENCOUNTER — Telehealth: Payer: Self-pay | Admitting: Pharmacist

## 2016-09-19 MED ORDER — ATORVASTATIN CALCIUM 10 MG PO TABS: 10.0000 mg | ORAL_TABLET | Freq: Every day | ORAL | 0 refills | Status: DC

## 2016-09-19 NOTE — Telephone Encounter (Signed)
Called to check on patient and BG.  No answer - LM for patient to call office to discuss BG

## 2016-09-19 NOTE — Telephone Encounter (Signed)
-----   Message from Cherre Robins, PharmD sent at 09/07/2016 10:29 AM EDT ----- Regarding: BG Call to check on BG

## 2016-10-09 ENCOUNTER — Ambulatory Visit: Payer: Medicare Other | Admitting: Family Medicine

## 2016-10-10 ENCOUNTER — Encounter: Payer: Self-pay | Admitting: Family Medicine

## 2016-10-10 ENCOUNTER — Other Ambulatory Visit: Payer: Self-pay | Admitting: Family Medicine

## 2016-10-10 MED ORDER — ENALAPRIL MALEATE 20 MG PO TABS
40.0000 mg | ORAL_TABLET | Freq: Every day | ORAL | 0 refills | Status: DC
Start: 2016-10-10 — End: 2016-11-13

## 2016-10-10 NOTE — Telephone Encounter (Signed)
done

## 2016-10-16 ENCOUNTER — Telehealth: Payer: Self-pay | Admitting: Family Medicine

## 2016-10-16 NOTE — Telephone Encounter (Signed)
Left #1 sample of [place] insulin for patient. (only had 1 sample) She is to continue 50 units qd.  This will last about 9 days.  She has an appt with me 10/31/16 but she can call when she needs more.  We will discuss other options and look at her formulary when she comes in.

## 2016-10-16 NOTE — Telephone Encounter (Signed)
Returned patient's phone call.  Patient was prescribed prednisone in October by Dr. Daiva Eves.  Patient states that Blood sugars have been in the 500s before insulin and in the 200-300 range after using insulin.  Patient states that she can not afford insulin and is needing samples of lantus or [place]

## 2016-10-17 ENCOUNTER — Ambulatory Visit: Payer: Medicare Other | Admitting: Family Medicine

## 2016-10-24 DIAGNOSIS — M069 Rheumatoid arthritis, unspecified: Secondary | ICD-10-CM | POA: Diagnosis not present

## 2016-10-24 DIAGNOSIS — M859 Disorder of bone density and structure, unspecified: Secondary | ICD-10-CM | POA: Diagnosis not present

## 2016-10-24 DIAGNOSIS — M8580 Other specified disorders of bone density and structure, unspecified site: Secondary | ICD-10-CM | POA: Diagnosis not present

## 2016-10-24 DIAGNOSIS — Z13820 Encounter for screening for osteoporosis: Secondary | ICD-10-CM | POA: Diagnosis not present

## 2016-10-24 DIAGNOSIS — Z79899 Other long term (current) drug therapy: Secondary | ICD-10-CM | POA: Diagnosis not present

## 2016-10-24 DIAGNOSIS — M0510 Rheumatoid lung disease with rheumatoid arthritis of unspecified site: Secondary | ICD-10-CM | POA: Diagnosis not present

## 2016-10-31 ENCOUNTER — Ambulatory Visit (INDEPENDENT_AMBULATORY_CARE_PROVIDER_SITE_OTHER): Payer: Medicare Other | Admitting: Pharmacist

## 2016-10-31 VITALS — BP 138/80 | HR 74 | Ht 67.0 in | Wt 164.0 lb

## 2016-10-31 DIAGNOSIS — E1165 Type 2 diabetes mellitus with hyperglycemia: Secondary | ICD-10-CM

## 2016-10-31 DIAGNOSIS — E1121 Type 2 diabetes mellitus with diabetic nephropathy: Secondary | ICD-10-CM | POA: Diagnosis not present

## 2016-10-31 DIAGNOSIS — IMO0002 Reserved for concepts with insufficient information to code with codable children: Secondary | ICD-10-CM

## 2016-10-31 DIAGNOSIS — Z794 Long term (current) use of insulin: Secondary | ICD-10-CM

## 2016-10-31 LAB — BAYER DCA HB A1C WAIVED

## 2016-10-31 LAB — BASIC METABOLIC PANEL
BUN: 25 — AB (ref 4–21)
Creatinine: 1.2 — AB (ref <=1.1)

## 2016-10-31 LAB — HEMOGLOBIN A1C: Hemoglobin A1C: 14

## 2016-10-31 MED ORDER — INSULIN LISPRO 100 UNIT/ML (KWIKPEN): 5.0000 [IU] | PEN_INJECTOR | Freq: Three times a day (TID) | SUBCUTANEOUS | 1 refills | Status: DC

## 2016-10-31 NOTE — Patient Instructions (Signed)
Increase Toujeo / Lantus / Tyler Aas (long acting insulin) to 50 units once a day.   Add Humalog / Novolog (short acting insulin) inject 5 units with evening meal or as needed for blood glucose over 250.   Dr Dossie Der sent in a prescription for alendronate (medication for bones) last month - it is ready for pick up at [facility].    Hypoglycemia Hypoglycemia occurs when the level of sugar (glucose) in the blood is too low. Glucose is a type of sugar that provides the body's main source of energy. Certain hormones (insulin and glucagon) control the level of glucose in the blood. Insulin lowers blood glucose, and glucagon increases blood glucose. Hypoglycemia can result from having too much insulin in the bloodstream, or from not eating enough food that contains glucose. Hypoglycemia can happen in people who do or do not have diabetes. It can develop quickly, and it can be a medical emergency. What are the causes? Hypoglycemia occurs most often in people who have diabetes. If you have diabetes, hypoglycemia may be caused by:  Diabetes medicine. More likely to occur with insulin.  Not eating enough, or not eating often enough.  Increased physical activity.  Drinking alcohol, especially when you have not eaten recently. What increases the risk? Hypoglycemia is more likely to develop in:  People who have diabetes and take medicines to lower blood glucose.  People who abuse alcohol.  People who have a severe illness. What are the signs or symptoms? Hypoglycemia may not cause any symptoms. If you have symptoms, they may include:  Hunger.  Anxiety.  Sweating and feeling clammy.  Confusion.  Dizziness or feeling light-headed.  Sleepiness.  Nausea.  Increased heart rate.  Headache.  Blurry vision.  Seizure.  Nightmares.  Tingling or numbness around the mouth, lips, or tongue.  A change in speech.  Decreased ability to concentrate.  A change in coordination.  Restless  sleep.  Tremors or shakes.  Fainting.  Irritability. How is this treated Low Blood glucose (Blood glucose less than 70)? This condition can often be treated by immediately eating or drinking something that contains glucose / sugar, such as:  3-4 sugar tablets (glucose pills).  Glucose gel, 15-gram tube.  Fruit juice, 4 oz (120 mL).  Regular soda (not diet soda), 4 oz (120 mL).  Low-fat milk, 4 oz (120 mL).  Several pieces of hard candy.  Sugar or honey, 1 Tbsp. Treating Hypoglycemia If You Have Diabetes  If you are alert and able to swallow safely, follow the 15:15 rule:  Take 15 grams of a rapid-acting carbohydrate.  Rapid-acting options include:  1 tube of glucose gel.  3 glucose pills.  6-8 pieces of hard candy.  4 oz (120 mL) of fruit juice.  4 oz (120 ml) of regular (not diet) soda.  Check your blood glucose 15 to 20 minutes after you take the carbohydrate.  If the repeat blood glucose level is still at or below 70 mg/dL (3.9 mmol/L), take 15 grams of a carbohydrate again.  If your blood glucose level does not increase above 70 mg/dL (3.9 mmol/L) after 3 tries, seek emergency medical care.  After your blood glucose level returns to normal, eat a meal or a snack within 1 hour. Treating Severe Hypoglycemia  Severe hypoglycemia is when your blood glucose level is at or below 54 mg/dL (3 mmol/L). Severe hypoglycemia is an emergency. Do not wait to see if the symptoms will go away. Get medical help right away. Call your  local emergency services (911 in the [place] Do not drive yourself to the hospital. If you have severe hypoglycemia and you cannot eat or drink, you may need an injection of glucagon. A family member or close friend should learn how to check your blood glucose and how to give you a glucagon injection. Ask your health care provider if you need to have an emergency glucagon injection kit available. Severe hypoglycemia may need to be treated in a  hospital. The treatment may include getting glucose through an IV tube. You may also need treatment for the cause of your hypoglycemia. Follow these instructions at home: General instructions  Avoid any diets that cause you to not eat enough food. Talk with your health care provider before you start any new diet.  Take over-the-counter and prescription medicines only as told by your health care provider.  Limit alcohol intake to no more than 1 drink per day for nonpregnant women and 2 drinks per day for men. One drink equals 12 oz of beer, 5 oz of wine, or 1 oz of hard liquor.  Keep all follow-up visits as told by your health care provider. This is important. If You Have Diabetes:   Make sure you know the symptoms of hypoglycemia.  Always have a rapid-acting carbohydrate snack with you to treat low blood sugar.  Follow your diabetes management plan, as told by your health care provider. Make sure you:  Take your medicines as directed.  Follow your exercise plan.  Follow your meal plan. Eat on time, and do not skip meals.  Check your blood glucose as often as directed. Make sure to check your blood glucose before and after exercise. If you exercise longer or in a different way than usual, check your blood glucose more often.  Follow your sick day plan whenever you cannot eat or drink normally. Make this plan in advance with your health care provider.  Share your diabetes management plan with people in your workplace, school, and household.  Check your urine for ketones when you are ill and as told by your health care provider.  Carry a medical alert card or wear medical alert jewelry. Contact a health care provider if:  You have problems keeping your blood glucose in your target range.  You have frequent episodes of hypoglycemia. (more than 1 episode per week) Get help right away if:  You continue to have hypoglycemia symptoms after eating or drinking something containing  glucose.  Your blood glucose is at or below 54 mg/dL (3 mmol/L).  You have a seizure.  You faint. These symptoms may represent a serious problem that is an emergency. Do not wait to see if the symptoms will go away. Get medical help right away. Call your local emergency services (911 in the [place] Do not drive yourself to the hospital.  This information is not intended to replace advice given to you by your health care provider. Make sure you discuss any questions you have with your health care provider.

## 2016-10-31 NOTE — Progress Notes (Signed)
Patient ID: [name], female   DOB: 1945-11-13, 71 y.o.   MRN: [id number]   Subjective:    Erica Crosby is a 71 y.o. female who presents for a re-evaluation of Type 2 diabetes mellitus requiring insulin therapy.    Erica Crosby is fairly new to our office. She transferred in January 2018 because her PCP has retired.  She was diagnosed with DM in 1997.  She has seen Dr Dorris Fetch / endocrinologist in past.    She is currently taking Lantus or other long acting insulin 40 units per day and metformin 574m bid.   She reports that she has been compliant with DM meds.   She checks HBG 1 to 2 times per day but she did not bring in glucometer or BG records today.  Self repots lowest BG of 250 and BG is mostly in 200's and 300's Reports one hypoglycemic event about 2 months ago - she could not identify cause of hypoglycemia  A1c was over 14% today  Current symptoms/problems include hyperglycemia and polyuria and have been unchanged. Symptoms have been present for 4 months.  Known diabetic complications: nephropathy and peripheral neuropathy Cardiovascular risk factors: advanced age (older than 51for men, 612for women), diabetes mellitus, dyslipidemia and sedentary lifestyle Eye exam current (within one year): yes Prior visit with CDE: yes Current diet: in general, an "unhealthy" diet  / not counting CHO's Current exercise: none Medication Compliance?  Questionable.   [facility] but she did not answer their calls or other forms of communication attempts.     Objective:    There were no vitals taken for this visit.   A1c = over 14% today Estimated A1c was 12.3% (09/06/2016) A1c = over 14% (08/01/2016)   Lab Review Glucose (mg/dL)  Date Value  07/17/2016 411 (>)   Glucose, Bld (mg/dL)  Date Value  02/11/2012 140 (H)  02/09/2012 272 (H)   Creatinine, Ser (mg/dL)  Date Value  07/17/2016 1.43 (H)  02/11/2012 0.81  02/09/2012 1.08    Assessment:    Diabetes Mellitus type II, under inadequate control no recent hypoglycemia but patient had in past when wore pro CGM Long term prednisone use complicating diabetes and high risk for osteoporosis   Plan:    1.  Rx changes:   Increase long acting insulin - given #2 Toujeo samples - 50 units daily   Start Humalog 5 units with evening meal - after 1 week will reassess and increase as needed   Continue metformin 5030mbid  2.  Education: Reviewed BG goals with patient and also discussed the possible complications of uncontrolled DM.  Patient expressed that she wishes to remain as independent as possible.  Reinforced that to do this she has to take medications everyday and as prescribed in order to prevention DM complications. 3.  Recommended check BG 3-4 times a day prior to meals and at bedtime  4.  Patient was prescribed alendronate 7082meekly by Dr SyeDossie Dert [facility] and will pick up today 5.  Follow up: PCP in 2 weeks. Follow up with me in 1 month.  Orders Placed This Encounter  Procedures  . Microalbumin / creatinine urine ratio  . Bayer DCA Hb A1c Waived  . BMP8+EGFR

## 2016-11-01 LAB — MICROALBUMIN / CREATININE URINE RATIO
Creatinine, Urine: 64.1 mg/dL
Microalb/Creat Ratio: 851.2 mg/g creat — ABNORMAL HIGH (ref 0.0–30.0)
Microalbumin, Urine: 545.6 ug/mL

## 2016-11-01 LAB — BMP8+EGFR
BUN/Creatinine Ratio: 20 (ref 12–28)
BUN: 25 mg/dL (ref 8–27)
CHLORIDE: 96 mmol/L (ref 96–106)
CO2: 24 mmol/L (ref 18–29)
Calcium: 9.3 mg/dL (ref 8.7–10.3)
Creatinine, Ser: 1.23 mg/dL — ABNORMAL HIGH (ref 0.57–1.00)
GFR, EST AFRICAN AMERICAN: 52 mL/min/{1.73_m2} — AB (ref >59)
GFR, EST NON AFRICAN AMERICAN: 45 mL/min/{1.73_m2} — AB (ref >59)
Glucose: 403 mg/dL (ref 65–99)
Potassium: 4 mmol/L (ref 3.5–5.2)
Sodium: 137 mmol/L (ref 134–144)

## 2016-11-09 ENCOUNTER — Other Ambulatory Visit: Payer: Self-pay | Admitting: Pharmacist

## 2016-11-09 DIAGNOSIS — E1121 Type 2 diabetes mellitus with diabetic nephropathy: Secondary | ICD-10-CM

## 2016-11-13 ENCOUNTER — Ambulatory Visit (INDEPENDENT_AMBULATORY_CARE_PROVIDER_SITE_OTHER): Payer: Medicare Other | Admitting: Family Medicine

## 2016-11-13 ENCOUNTER — Encounter: Payer: Self-pay | Admitting: Family Medicine

## 2016-11-13 ENCOUNTER — Other Ambulatory Visit: Payer: Self-pay | Admitting: Family Medicine

## 2016-11-13 VITALS — BP 123/74 | HR 119 | Temp 97.7°F | Ht 67.0 in | Wt 153.2 lb

## 2016-11-13 DIAGNOSIS — E1121 Type 2 diabetes mellitus with diabetic nephropathy: Secondary | ICD-10-CM

## 2016-11-13 DIAGNOSIS — IMO0002 Reserved for concepts with insufficient information to code with codable children: Secondary | ICD-10-CM

## 2016-11-13 DIAGNOSIS — E1165 Type 2 diabetes mellitus with hyperglycemia: Secondary | ICD-10-CM | POA: Diagnosis not present

## 2016-11-13 DIAGNOSIS — Z794 Long term (current) use of insulin: Secondary | ICD-10-CM | POA: Diagnosis not present

## 2016-11-13 MED ORDER — INSULIN GLARGINE 100 UNIT/ML SOLOSTAR PEN: 50.0000 [IU] | PEN_INJECTOR | Freq: Every day | SUBCUTANEOUS | 11 refills | Status: DC

## 2016-11-13 NOTE — Progress Notes (Signed)
   HPI  Patient presents today for follow-up diabetes and hypertension.  Diabetes Patient with very poor compliance, she reports taking 40 units of long-acting insulin daily. States that cost over $200 for medication and states that she cannot afford this. Reports blood sugars as high as 500s at times, usually in the 300s. Patient also reports poor energy and easy fatigability.  PMH: Smoking status noted ROS: Per HPI  Objective: BP 123/74   Pulse (!) 119   Temp 97.7 F (36.5 C) (Oral)   Ht _0  (1.702 m)   Wt 153 lb 3.2 oz (69.5 kg)   BMI 23.99 kg/m  Gen: NAD, alert, cooperative with exam HEENT: NCAT CV: RRR, good S1/S2, no murmur Resp: CTABL, no wheezes, non-labored Ext: No edema, warm Neuro: Alert and oriented, No gross deficits  Assessment and plan:  # Type 2 diabetes Uncontrolled, A1c recently greater than 14 Patient reports uncontrolled CBGs at home Recommended daily use of basal insulin, given sample of insulin glargine and Humalog today Increase to 50 units daily of basal daily +10 units of Humalog only at meals. Refer to endocrinology We had a direct conversation about how dangerous her uncontrolled diabetes mellitus.  Pt appears to not have met her deductible for the year, her insulin is on the preferred list for her insurance- discussed this also.     Orders Placed This Encounter  Procedures  . BMP8+EGFR  . Ambulatory referral to Endocrinology    Referral Priority:   Routine    Referral Type:   Consultation    Referral Reason:   Specialty Services Required    Number of Visits Requested:   1    Meds ordered this encounter  Medications  . Insulin Glargine (LANTUS SOLOSTAR) 100 UNIT/ML Solostar Pen    Sig: Inject 50 Units into the skin daily.    Dispense:  5 pen    Refill:  [employer], MD [facility] 11/13/2016, 5:14 PM

## 2016-11-13 NOTE — Patient Instructions (Signed)
Great to see you!  We are sending you to se an endocrinologist in [place].

## 2016-11-14 ENCOUNTER — Telehealth: Payer: Self-pay | Admitting: *Deleted

## 2016-11-14 ENCOUNTER — Emergency Department (HOSPITAL_COMMUNITY)
Admission: EM | Admit: 2016-11-14 | Discharge: 2016-11-14 | Disposition: A | Discharge status: Home / Self Care | Payer: Medicare Other | Attending: Emergency Medicine | Admitting: Emergency Medicine

## 2016-11-14 ENCOUNTER — Encounter (HOSPITAL_COMMUNITY): Payer: Self-pay | Admitting: Emergency Medicine

## 2016-11-14 ENCOUNTER — Emergency Department (HOSPITAL_COMMUNITY): Payer: Medicare Other

## 2016-11-14 DIAGNOSIS — Z7982 Long term (current) use of aspirin: Secondary | ICD-10-CM | POA: Insufficient documentation

## 2016-11-14 DIAGNOSIS — J9811 Atelectasis: Secondary | ICD-10-CM | POA: Diagnosis not present

## 2016-11-14 DIAGNOSIS — E1165 Type 2 diabetes mellitus with hyperglycemia: Secondary | ICD-10-CM | POA: Insufficient documentation

## 2016-11-14 DIAGNOSIS — R739 Hyperglycemia, unspecified: Secondary | ICD-10-CM

## 2016-11-14 DIAGNOSIS — I2510 Atherosclerotic heart disease of native coronary artery without angina pectoris: Secondary | ICD-10-CM | POA: Insufficient documentation

## 2016-11-14 DIAGNOSIS — Z794 Long term (current) use of insulin: Secondary | ICD-10-CM | POA: Diagnosis not present

## 2016-11-14 DIAGNOSIS — Z87891 Personal history of nicotine dependence: Secondary | ICD-10-CM | POA: Insufficient documentation

## 2016-11-14 DIAGNOSIS — I10 Essential (primary) hypertension: Secondary | ICD-10-CM | POA: Diagnosis not present

## 2016-11-14 DIAGNOSIS — Z79899 Other long term (current) drug therapy: Secondary | ICD-10-CM | POA: Insufficient documentation

## 2016-11-14 LAB — BASIC METABOLIC PANEL
Anion gap: 9 (ref 5–15)
BUN: 43 mg/dL — AB (ref 6–20)
CO2: 22 mmol/L (ref 22–32)
Calcium: 8.5 mg/dL — ABNORMAL LOW (ref 8.9–10.3)
Chloride: 98 mmol/L — ABNORMAL LOW (ref 101–111)
Creatinine, Ser: 1.96 mg/dL — ABNORMAL HIGH (ref 0.44–1.00)
GFR, EST AFRICAN AMERICAN: 29 mL/min — AB (ref >60)
GFR, EST NON AFRICAN AMERICAN: 25 mL/min — AB (ref >60)
Glucose, Bld: 472 mg/dL — ABNORMAL HIGH (ref 65–99)
POTASSIUM: 4.6 mmol/L (ref 3.5–5.1)
SODIUM: 129 mmol/L — AB (ref 135–145)

## 2016-11-14 LAB — CBC WITH DIFFERENTIAL/PLATELET
BASOS ABS: 0 10*3/uL (ref 0.0–0.1)
BASOS PCT: 0 %
EOS ABS: 0.1 10*3/uL (ref 0.0–0.7)
EOS PCT: 1 %
HCT: 32.3 % — ABNORMAL LOW (ref 36.0–46.0)
HEMOGLOBIN: 10.7 g/dL — AB (ref 12.0–15.0)
LYMPHS ABS: 2.4 10*3/uL (ref 0.7–4.0)
Lymphocytes Relative: 31 %
MCH: 28.4 pg (ref 26.0–34.0)
MCHC: 33.1 g/dL (ref 30.0–36.0)
MCV: 85.7 fL (ref 78.0–100.0)
Monocytes Absolute: 0.3 10*3/uL (ref 0.1–1.0)
Monocytes Relative: 4 %
NEUTROS PCT: 64 %
Neutro Abs: 5 10*3/uL (ref 1.7–7.7)
Platelets: 224 10*3/uL (ref 150–400)
RBC: 3.77 MIL/uL — AB (ref 3.87–5.11)
RDW: 14.4 % (ref 11.5–15.5)
WBC: 7.9 10*3/uL (ref 4.0–10.5)

## 2016-11-14 LAB — URINALYSIS, ROUTINE W REFLEX MICROSCOPIC
Bilirubin Urine: NEGATIVE
Ketones, ur: NEGATIVE mg/dL
Nitrite: NEGATIVE
PROTEIN: NEGATIVE mg/dL
Specific Gravity, Urine: 1.01 (ref 1.005–1.030)
pH: 5 (ref 5.0–8.0)

## 2016-11-14 LAB — BMP8+EGFR
BUN / CREAT RATIO: 15 (ref 12–28)
BUN: 34 mg/dL — ABNORMAL HIGH (ref 8–27)
CO2: 20 mmol/L (ref 18–29)
Calcium: 8.6 mg/dL — ABNORMAL LOW (ref 8.7–10.3)
Chloride: 89 mmol/L — ABNORMAL LOW (ref 96–106)
Creatinine, Ser: 2.33 mg/dL — ABNORMAL HIGH (ref 0.57–1.00)
GFR calc Af Amer: 24 mL/min/{1.73_m2} — ABNORMAL LOW (ref >59)
GFR calc non Af Amer: 21 mL/min/{1.73_m2} — ABNORMAL LOW (ref >59)
GLUCOSE: 568 mg/dL — AB (ref 65–99)
Potassium: 4.7 mmol/L (ref 3.5–5.2)
SODIUM: 128 mmol/L — AB (ref 134–144)

## 2016-11-14 LAB — CBG MONITORING, ED
GLUCOSE-CAPILLARY: 462 mg/dL — AB (ref 65–99)
GLUCOSE-CAPILLARY: 352 mg/dL — AB (ref 65–99)

## 2016-11-14 MED ORDER — SODIUM CHLORIDE 0.9 % IV BOLUS (SEPSIS)
1000.0000 mL | Freq: Once | INTRAVENOUS | Status: AC
  Administered 2016-11-14: 1000 mL via INTRAVENOUS

## 2016-11-14 NOTE — Discharge Instructions (Signed)
Please continue to take your insulin as prescribed.  Drink plenty of fluids and keep well hydrated. See your PCP in 1 week for kidney function recheck. See your kidney doctor as scheduled. Return for worsening symptoms, including fever, confusion, vomiting, or any other symptoms concerning to you.

## 2016-11-14 NOTE — ED Notes (Signed)
ekg given to dr. Thurnell Garbe

## 2016-11-14 NOTE — ED Notes (Signed)
cbg in triage 462.

## 2016-11-14 NOTE — Telephone Encounter (Signed)
Patient aware of recommendations and verbalizes understanding. Patient states that she is going to go to [facility] for evaluation.

## 2016-11-14 NOTE — ED Triage Notes (Signed)
Pt reports sent here from pcp officer regarding abnormal kidney function, heart rhythm, hyperglycemia. nad noted. Pt reports diarrhea intermittently since Saturday.

## 2016-11-14 NOTE — Telephone Encounter (Signed)
Patients BS is 568 and creatinine is increased to 2.33. Per Erica Crosby patient needs to go to hospital for evaluation since creatinine has increased since 2 weeks ago.

## 2016-11-14 NOTE — ED Provider Notes (Signed)
[facility] DEPT Provider Note   CSN: [id number] Arrival date & time: 11/14/16  1534     History   Chief Complaint Chief Complaint  Patient presents with  . Abnormal Lab    HPI Erica Crosby is a 71 y.o. female.  HPI 71-year-old female who presents with hyperglycemia. She was sent from her PCPs office today regarding abnormal kidney function elevated blood sugar. She has a history of diabetes, coronary artery disease, hypertension and hyperlipidemia. States that she was at a routine follow-up appointment with her PCP a few days ago, and instructed to come to ED today by phone call from her PCP's due to abnormal blood work. States that up until her recent follow-up, she is not been consistently taking her insulin. She has noted some polyuria and polydipsia. He has not had significant weight loss. Denies any fevers, confusion, chest pain or difficulty breathing, severe abdominal pain, nausea or vomiting. Did have a few episodes of loose stool over the weekend.   Past Medical History:  Diagnosis Date  . Arthritis   . Cataract   . Coronary artery disease    Mild plaque 2012  . Diabetes mellitus   . GERD (gastroesophageal reflux disease)   . Hyperlipidemia   . Hypertension   . Neuropathy   . Peptic ulcer   . Rheumatoid arthritis (HCC)     Patient Active Problem List   Diagnosis Date Noted  . Bulging lumbar disc 08/22/2016  . Hypertension 02/10/2012  . TIA (transient ischemic attack) 02/10/2012  . Ataxia 02/10/2012  . DM (diabetes mellitus), type 2, uncontrolled (HCC) 02/10/2012    Past Surgical History:  Procedure Laterality Date  . ABSCESS DRAINAGE    . CARPAL TUNNEL RELEASE Left   . CHOLECYSTECTOMY    . KNEE SURGERY Right   . NECK SURGERY     x 2  . SHOULDER SURGERY Left     OB History    No data available       Home Medications    Prior to Admission medications   Medication Sig Start Date End Date Taking? Authorizing Provider  aspirin EC 81 MG tablet  Take 81 mg by mouth daily.   Yes [provider]  atorvastatin (LIPITOR) 10 MG tablet Take 1 tablet (10 mg total) by mouth daily. 09/19/16  Yes Bradshaw, Samuel L, MD  blood glucose meter kit and supplies KIT Dispense based on patient and insurance preference. Use up to three (3) times daily as directed. (FOR insulin dependent diabetes - E11.79) 08/22/16  Yes Bradshaw, Samuel L, MD  enalapril (VASOTEC) 20 MG tablet TAKE 2 TABLETS BY MOUTH ONCE DAILY 11/14/16  Yes Bradshaw, Samuel L, MD  folic acid (FOLVITE) 1 MG tablet Take 1 tablet (1 mg total) by mouth daily. 07/09/16  Yes Bradshaw, Samuel L, MD  gabapentin (NEURONTIN) 400 MG capsule Take 400 mg by mouth at bedtime.   Yes [provider]  Insulin Glargine (BASAGLAR KWIKPEN) 100 UNIT/ML SOPN Inject 50 Units into the skin at bedtime.   Yes [provider]  insulin lispro (HUMALOG KWIKPEN) 100 UNIT/ML KiwkPen Inject 0.05-0.1 mLs (5-10 Units total) into the skin 3 (three) times daily. 10/31/16  Yes Eckard, Tammy, PharmD  metFORMIN (GLUCOPHAGE) 500 MG tablet Take 500 mg by mouth 2 (two) times daily with a meal.   Yes [provider]  methotrexate (RHEUMATREX) 2.5 MG tablet Take 4 tablets by mouth every 7 (seven) days.  07/20/16  Yes [provider]  ONE TOUCH   ULTRA TEST test strip  08/23/16  Yes [provider]  ONETOUCH DELICA LANCETS 33G MISC  08/23/16  Yes [provider]  pantoprazole (PROTONIX) 40 MG tablet Take 40 mg by mouth daily.   Yes [provider]  predniSONE (DELTASONE) 5 MG tablet Take 5 mg by mouth daily. 07/23/16  Yes [provider]  alendronate (FOSAMAX) 70 MG tablet Take 1 tablet by mouth once a week. 10/29/16   [provider]  ergocalciferol (VITAMIN D2) 50000 units capsule Take 50,000 Units by mouth every 14 (fourteen) days.     [provider]  Insulin Glargine (LANTUS SOLOSTAR) 100 UNIT/ML Solostar Pen Inject 50 Units into the skin daily. 11/13/16    Bradshaw, Samuel L, MD    Family History Family History  Problem Relation Age of Onset  . Heart disease Sister        Congeital (died age 3) with "enlarged heart"  . CAD Sister   . Diabetes Mother   . Heart disease Mother        Later onset heart disease   . Dementia Mother   . Alcohol abuse Father   . Liver disease Father   . CAD Sister 57       CABG  . Early death Sister   . Diabetes Brother   . Kidney disease Brother        related to DM  . Diabetes Brother     Social History Social History  Substance Use Topics  . Smoking status: Former Smoker    Years: 25.00    Types: Cigarettes, E-cigarettes    Quit date: 02/02/2013  . Smokeless tobacco: Never Used  . Alcohol use No     Allergies   Patient has no known allergies.   Review of Systems Review of Systems  Constitutional: Negative for fever.  HENT: Negative for congestion.   Respiratory: Negative for cough.   Cardiovascular: Negative for chest pain.  Gastrointestinal: Negative for nausea and vomiting.  Endocrine: Positive for polyuria.  Genitourinary: Positive for frequency.  Neurological: Negative for syncope.  Psychiatric/Behavioral: Negative for confusion.  All other systems reviewed and are negative.    Physical Exam Updated Vital Signs BP (!) 145/75   Pulse 97   Temp 98.4 F (36.9 C) (Oral)   Resp 13   Ht 5' 7" (1.702 m)   Wt 69.4 kg (153 lb)   SpO2 100%   BMI 23.96 kg/m   Physical Exam Physical Exam  Nursing note and vitals reviewed. Constitutional: non-toxic, and in no acute distress Head: Normocephalic and atraumatic.  Mouth/Throat: Oropharynx is clear and dry mucous membranes.  Neck: Normal range of motion. Neck supple.  Cardiovascular: Normal rate and regular rhythm.   Pulmonary/Chest: Effort normal and breath sounds normal.  Abdominal: Soft. There is no tenderness. There is no rebound and no guarding.  Musculoskeletal: Normal range of motion.  Neurological: Alert, no facial  droop, fluent speech, moves all extremities symmetrically Skin: Skin is warm and dry.  Psychiatric: Cooperative   ED Treatments / Results  Labs (all labs ordered are listed, but only abnormal results are displayed) Labs Reviewed  URINALYSIS, ROUTINE W REFLEX MICROSCOPIC - Abnormal; Notable for the following:       Result Value   Color, Urine STRAW (*)    APPearance HAZY (*)    Glucose, UA >=500 (*)    Hgb urine dipstick SMALL (*)    Leukocytes, UA LARGE (*)    Bacteria, UA FEW (*)      Squamous Epithelial / LPF 6-30 (*)    All other components within normal limits  CBC WITH DIFFERENTIAL/PLATELET - Abnormal; Notable for the following:    RBC 3.77 (*)    Hemoglobin 10.7 (*)    HCT 32.3 (*)    All other components within normal limits  BASIC METABOLIC PANEL - Abnormal; Notable for the following:    Sodium 129 (*)    Chloride 98 (*)    Glucose, Bld 472 (*)    BUN 43 (*)    Creatinine, Ser 1.96 (*)    Calcium 8.5 (*)    GFR calc non Af Amer 25 (*)    GFR calc Af Amer 29 (*)    All other components within normal limits  CBG MONITORING, ED - Abnormal; Notable for the following:    Glucose-Capillary 462 (*)    All other components within normal limits  CBG MONITORING, ED - Abnormal; Notable for the following:    Glucose-Capillary 352 (*)    All other components within normal limits    EKG  EKG Interpretation  Date/Time:  Wednesday Nov 14 2016 16:06:13 EDT Ventricular Rate:  105 PR Interval:  130 QRS Duration: 84 QT Interval:  338 QTC Calculation: 446 R Axis:   41 Text Interpretation:  Sinus tachycardia Right atrial enlargement Septal infarct , age undetermined Abnormal ECG similar to prior EKG  Confirmed by Brantley Stage [phone]) on 11/14/2016 6:31:05 PM       Radiology Dg Chest 2 View  Result Date: 11/14/2016 CLINICAL DATA:  Abnormal kidney function EXAM: CHEST  2 VIEW COMPARISON:  11/04/2015 FINDINGS: Normal heart size. Aortic atherosclerosis. No pleural effusion or  edema. Scar versus atelectasis noted in the left lung base. IMPRESSION: 1. Left base scar versus atelectasis. 2. Aortic Atherosclerosis (ICD10-I70.0) Electronically Signed   By: Kerby Moors M.D.   On: 11/14/2016 17:39    Procedures Procedures (including critical care time)  Medications Ordered in ED Medications  sodium chloride 0.9 % bolus 1,000 mL (1,000 mLs Intravenous New Bag/Given 11/14/16 1705)     Initial Impression / Assessment and Plan / ED Course  I have reviewed the triage vital signs and the nursing notes.  Pertinent labs & imaging results that were available during my care of the patient were reviewed by me and considered in my medical decision making (see chart for details).    Presenting with hyperglycemia and  acute kidney injury. This is likely related to her recent history of noncompliance with her anti-glycemic. She is mildly dry and mildly tachycardic initially on presentation. With hyperglycemia and glucose in the 400s, but no evidence of DKA. Her kidney function is elevated to 1.96 with a baseline of around 1.2-1.4. She has upcoming nephrology appointment in 2-3 weeks. She is given IV fluids, and subsequent glucose 300s. I discussed the importance of medication compliance. She is felt stable for discharge home with close PCP follow-up. Strict return and follow-up instructions reviewed. She expressed understanding of all discharge instructions and felt comfortable with the plan of care.   Final Clinical Impressions(s) / ED Diagnoses   Final diagnoses:  Hyperglycemia    New Prescriptions New Prescriptions   No medications on file     Forde Dandy, MD 11/14/16 [phone]

## 2016-11-14 NOTE — ED Notes (Signed)
Patient to xray.

## 2016-11-28 DIAGNOSIS — M0510 Rheumatoid lung disease with rheumatoid arthritis of unspecified site: Secondary | ICD-10-CM | POA: Diagnosis not present

## 2016-11-28 DIAGNOSIS — Z79899 Other long term (current) drug therapy: Secondary | ICD-10-CM | POA: Diagnosis not present

## 2016-11-28 DIAGNOSIS — M069 Rheumatoid arthritis, unspecified: Secondary | ICD-10-CM | POA: Diagnosis not present

## 2016-11-28 DIAGNOSIS — Z13820 Encounter for screening for osteoporosis: Secondary | ICD-10-CM | POA: Diagnosis not present

## 2016-11-29 ENCOUNTER — Other Ambulatory Visit: Payer: Self-pay

## 2016-11-29 MED ORDER — PANTOPRAZOLE SODIUM 40 MG PO TBEC: 40.0000 mg | DELAYED_RELEASE_TABLET | Freq: Every day | ORAL | 1 refills | Status: DC

## 2016-11-29 MED ORDER — GABAPENTIN 400 MG PO CAPS: 400.0000 mg | ORAL_CAPSULE | Freq: Every day | ORAL | 0 refills | Status: DC

## 2016-11-29 MED ORDER — ATORVASTATIN CALCIUM 10 MG PO TABS: 10.0000 mg | ORAL_TABLET | Freq: Every day | ORAL | 0 refills | Status: DC

## 2016-11-29 MED ORDER — METFORMIN HCL 500 MG PO TABS: 500.0000 mg | ORAL_TABLET | Freq: Two times a day (BID) | ORAL | 0 refills | Status: DC

## 2016-12-10 ENCOUNTER — Other Ambulatory Visit: Payer: Self-pay

## 2016-12-10 MED ORDER — ENALAPRIL MALEATE 20 MG PO TABS: 40.0000 mg | ORAL_TABLET | Freq: Every day | ORAL | 0 refills | Status: DC

## 2016-12-12 DIAGNOSIS — M069 Rheumatoid arthritis, unspecified: Secondary | ICD-10-CM | POA: Diagnosis not present

## 2016-12-12 DIAGNOSIS — E1129 Type 2 diabetes mellitus with other diabetic kidney complication: Secondary | ICD-10-CM | POA: Diagnosis not present

## 2016-12-12 DIAGNOSIS — I129 Hypertensive chronic kidney disease with stage 1 through stage 4 chronic kidney disease, or unspecified chronic kidney disease: Secondary | ICD-10-CM | POA: Diagnosis not present

## 2016-12-12 DIAGNOSIS — N179 Acute kidney failure, unspecified: Secondary | ICD-10-CM | POA: Diagnosis not present

## 2016-12-12 DIAGNOSIS — N184 Chronic kidney disease, stage 4 (severe): Secondary | ICD-10-CM | POA: Diagnosis not present

## 2016-12-12 DIAGNOSIS — N183 Chronic kidney disease, stage 3 (moderate): Secondary | ICD-10-CM | POA: Diagnosis not present

## 2016-12-13 ENCOUNTER — Ambulatory Visit: Payer: Self-pay | Admitting: Pharmacist

## 2016-12-14 DIAGNOSIS — N390 Urinary tract infection, site not specified: Secondary | ICD-10-CM | POA: Diagnosis not present

## 2016-12-17 ENCOUNTER — Encounter: Payer: Self-pay | Admitting: Family Medicine

## 2016-12-17 ENCOUNTER — Other Ambulatory Visit: Payer: Self-pay | Admitting: Family Medicine

## 2016-12-24 ENCOUNTER — Encounter: Payer: Self-pay | Admitting: "Endocrinology

## 2016-12-24 ENCOUNTER — Ambulatory Visit (INDEPENDENT_AMBULATORY_CARE_PROVIDER_SITE_OTHER): Payer: Medicare Other | Admitting: "Endocrinology

## 2016-12-24 VITALS — BP 154/91 | HR 99 | Ht 67.0 in | Wt 155.0 lb

## 2016-12-24 DIAGNOSIS — I10 Essential (primary) hypertension: Secondary | ICD-10-CM

## 2016-12-24 DIAGNOSIS — Z794 Long term (current) use of insulin: Secondary | ICD-10-CM

## 2016-12-24 DIAGNOSIS — I129 Hypertensive chronic kidney disease with stage 1 through stage 4 chronic kidney disease, or unspecified chronic kidney disease: Secondary | ICD-10-CM

## 2016-12-24 DIAGNOSIS — E1165 Type 2 diabetes mellitus with hyperglycemia: Secondary | ICD-10-CM

## 2016-12-24 DIAGNOSIS — Z9119 Patient's noncompliance with other medical treatment and regimen: Secondary | ICD-10-CM | POA: Diagnosis not present

## 2016-12-24 DIAGNOSIS — N183 Chronic kidney disease, stage 3 (moderate): Secondary | ICD-10-CM

## 2016-12-24 DIAGNOSIS — E1121 Type 2 diabetes mellitus with diabetic nephropathy: Secondary | ICD-10-CM | POA: Diagnosis not present

## 2016-12-24 DIAGNOSIS — IMO0002 Reserved for concepts with insufficient information to code with codable children: Secondary | ICD-10-CM

## 2016-12-24 DIAGNOSIS — Z91199 Patient's noncompliance with other medical treatment and regimen due to unspecified reason: Secondary | ICD-10-CM | POA: Insufficient documentation

## 2016-12-24 DIAGNOSIS — E1122 Type 2 diabetes mellitus with diabetic chronic kidney disease: Secondary | ICD-10-CM | POA: Insufficient documentation

## 2016-12-24 MED ORDER — INSULIN GLARGINE 100 UNIT/ML SOLOSTAR PEN: 60.0000 [IU] | PEN_INJECTOR | Freq: Every day | SUBCUTANEOUS | 2 refills | Status: DC

## 2016-12-24 NOTE — Progress Notes (Signed)
Subjective:    Patient ID: Erica Crosby, female    DOB: [birth year]. Patient is being seen in consultation for management of diabetes requested by  Timmothy Euler, MD  Past Medical History:  Diagnosis Date  . Arthritis   . Cataract   . Coronary artery disease    Mild plaque 2012  . Diabetes mellitus   . GERD (gastroesophageal reflux disease)   . Hyperlipidemia   . Hypertension   . Neuropathy   . Peptic ulcer   . Rheumatoid arthritis [facility])    Past Surgical History:  Procedure Laterality Date  . ABSCESS DRAINAGE    . CARPAL TUNNEL RELEASE Left   . CHOLECYSTECTOMY    . KNEE SURGERY Right   . NECK SURGERY     x 2  . SHOULDER SURGERY Left    Social History   Social History  . Marital status: Widowed    Spouse name: N/A  . Number of children: 3  . Years of education: N/A   Social History Main Topics  . Smoking status: Former Smoker    Years: 25.00    Types: Cigarettes, E-cigarettes    Quit date: 02/02/2013  . Smokeless tobacco: Never Used  . Alcohol use No  . Drug use: No  . Sexual activity: Not Currently    Birth control/ protection: Post-menopausal   Other Topics Concern  . None   Social History Narrative   Daughter stays with her.    Outpatient Encounter Prescriptions as of 12/24/2016  Medication Sig  . aspirin EC 81 MG tablet Take 81 mg by mouth daily.  Marland Kitchen atorvastatin (LIPITOR) 10 MG tablet Take 1 tablet (10 mg total) by mouth daily.  . ciprofloxacin (CIPRO) 500 MG tablet Take 500 mg by mouth daily with breakfast.  . enalapril (VASOTEC) 20 MG tablet Take 2 tablets (40 mg total) by mouth daily.  . ergocalciferol (VITAMIN D2) 50000 units capsule Take 50,000 Units by mouth every 14 (fourteen) days.   . folic acid (FOLVITE) 1 MG tablet Take 1 tablet (1 mg total) by mouth daily.  Marland Kitchen gabapentin (NEURONTIN) 400 MG capsule Take 1 capsule (400 mg total) by mouth at bedtime.  . Insulin Glargine (LANTUS SOLOSTAR) 100 UNIT/ML Solostar Pen Inject 60 Units into the  skin daily.  . methotrexate (RHEUMATREX) 2.5 MG tablet Take 4 tablets by mouth every 7 (seven) days.   . pantoprazole (PROTONIX) 40 MG tablet Take 1 tablet (40 mg total) by mouth daily.  . predniSONE (DELTASONE) 5 MG tablet Take 5 mg by mouth daily.  . [DISCONTINUED] Insulin Glargine (LANTUS SOLOSTAR) 100 UNIT/ML Solostar Pen Inject 50 Units into the skin daily.  . [DISCONTINUED] metFORMIN (GLUCOPHAGE) 500 MG tablet Take 1 tablet (500 mg total) by mouth 2 (two) times daily with a meal.  . alendronate (FOSAMAX) 70 MG tablet Take 1 tablet by mouth once a week.  . blood glucose meter kit and supplies KIT Dispense based on patient and insurance preference. Use up to three (3) times daily as directed. (FOR insulin dependent diabetes - E11.79)  . ONE TOUCH ULTRA TEST test strip   . ONETOUCH DELICA LANCETS 70W MISC   . [DISCONTINUED] enalapril (VASOTEC) 20 MG tablet TAKE 2 TABLETS BY MOUTH ONCE DAILY  . [DISCONTINUED] Insulin Glargine (BASAGLAR KWIKPEN) 100 UNIT/ML SOPN Inject 50 Units into the skin at bedtime.  . [DISCONTINUED] insulin lispro (HUMALOG KWIKPEN) 100 UNIT/ML KiwkPen Inject 0.05-0.1 mLs (5-10 Units total) into the skin 3 (three) times daily.  No facility-administered encounter medications on file as of 12/24/2016.    ALLERGIES: No Known Allergies VACCINATION STATUS: Immunization History  Administered Date(s) Administered  . Influenza,inj,Quad PF,36+ Mos 03/18/2016    Diabetes  She presents for her initial diabetic visit. She has type 2 diabetes mellitus. Onset time: She was diagnosed at approximate age of 71 years. Her disease course has been worsening. There are no hypoglycemic associated symptoms. Pertinent negatives for hypoglycemia include no confusion, headaches, pallor or seizures. Associated symptoms include blurred vision, fatigue, foot paresthesias, polydipsia, polyphagia and visual change. Pertinent negatives for diabetes include no chest pain and no polyuria. There are no  hypoglycemic complications. Symptoms are worsening. Diabetic complications include heart disease and nephropathy. Risk factors for coronary artery disease include dyslipidemia, diabetes mellitus, hypertension, tobacco exposure and sedentary lifestyle. Current diabetic treatment includes insulin injections. She is compliant with treatment none of the time. Her weight is stable. She is following a generally unhealthy diet. When asked about meal planning, she reported none. She has not had a previous visit with a dietitian. She never participates in exercise. There is no change in her home blood glucose trend. Her overall blood glucose range is >200 mg/dl. (She has 2 meters showing random blood glucose monitoring, average between 173 and 250 for the last 30 days.) An ACE inhibitor/angiotensin II receptor blocker is being taken. Eye exam is current.  Hyperlipidemia  This is a chronic problem. The current episode started more than 1 year ago. The problem is uncontrolled. Pertinent negatives include no chest pain, myalgias or shortness of breath. Current antihyperlipidemic treatment includes statins. Risk factors for coronary artery disease include dyslipidemia, diabetes mellitus, hypertension and a sedentary lifestyle.  Hypertension  This is a chronic problem. The current episode started more than 1 year ago. Associated symptoms include blurred vision. Pertinent negatives include no chest pain, headaches, palpitations or shortness of breath. Risk factors for coronary artery disease include dyslipidemia, diabetes mellitus, smoking/tobacco exposure and sedentary lifestyle. Past treatments include ACE inhibitors.       Review of Systems  Constitutional: Positive for fatigue. Negative for chills, fever and unexpected weight change.  HENT: Negative for trouble swallowing and voice change.   Eyes: Positive for blurred vision. Negative for visual disturbance.  Respiratory: Negative for cough, shortness of breath  and wheezing.   Cardiovascular: Negative for chest pain, palpitations and leg swelling.  Gastrointestinal: Negative for diarrhea, nausea and vomiting.  Endocrine: Positive for polydipsia and polyphagia. Negative for cold intolerance, heat intolerance and polyuria.  Musculoskeletal: Negative for arthralgias and myalgias.  Skin: Negative for color change, pallor, rash and wound.  Neurological: Negative for seizures and headaches.  Psychiatric/Behavioral: Negative for confusion and suicidal ideas.    Objective:    BP (!) 154/91   Pulse 99   Ht _0  (1.702 m)   Wt 155 lb (70.3 kg)   BMI 24.28 kg/m   Wt Readings from Last 3 Encounters:  12/24/16 155 lb (70.3 kg)  11/14/16 153 lb (69.4 kg)  11/13/16 153 lb 3.2 oz (69.5 kg)    Physical Exam  Constitutional: She is oriented to person, place, and time. She appears well-developed.  HENT:  Head: Normocephalic and atraumatic.  Eyes: EOM are normal.  Neck: Normal range of motion. Neck supple. No tracheal deviation present. No thyromegaly present.  Cardiovascular: Normal rate and regular rhythm.   Pulmonary/Chest: Effort normal and breath sounds normal.  Abdominal: Soft. Bowel sounds are normal. There is no tenderness. There is no guarding.  Musculoskeletal: Normal range of motion. She exhibits no edema.  Neurological: She is alert and oriented to person, place, and time. She has normal reflexes. No cranial nerve deficit. Coordination normal.  Skin: Skin is warm and dry. No rash noted. No erythema. No pallor.  Psychiatric: She has a normal mood and affect. Judgment normal.     CMP     Component Value Date/Time   NA 129 (L) 11/14/2016 1627   NA 128 (L) 11/13/2016 1425   K 4.6 11/14/2016 1627   CL 98 (L) 11/14/2016 1627   CO2 22 11/14/2016 1627   GLUCOSE 472 (H) 11/14/2016 1627   BUN 43 (H) 11/14/2016 1627   BUN 34 (H) 11/13/2016 1425   CREATININE 1.96 (H) 11/14/2016 1627   CALCIUM 8.5 (L) 11/14/2016 1627   PROT 6.2 07/17/2016  0842   ALBUMIN 3.6 07/17/2016 0842   AST 12 07/17/2016 0842   ALT 10 07/17/2016 0842   ALKPHOS 95 07/17/2016 0842   BILITOT 0.4 07/17/2016 0842   GFRNONAA 25 (L) 11/14/2016 1627   GFRAA 29 (L) 11/14/2016 1627     Diabetic Labs (most recent): Lab Results  Component Value Date   HGBA1C 14 10/31/2016   HGBA1C 9.9 (H) 02/10/2012     Lipid Panel ( most recent) Lipid Panel     Component Value Date/Time   CHOL 125 07/17/2016 0842   TRIG 75 07/17/2016 0842   HDL 53 07/17/2016 0842   CHOLHDL 2.4 07/17/2016 0842   CHOLHDL 3.3 02/10/2012 0520   VLDL 17 02/10/2012 0520   LDLCALC 57 07/17/2016 0842       Assessment & Plan:   1. Uncontrolled type 2 diabetes mellitus with diabetic nephropathy, with long-term current use of insulin ([place])  - Patient has currently uncontrolled symptomatic type 2 DM since  71 years of age,  with most recent A1c of greater than 14 %. Recent labs reviewed. - This patient was seen by me several years ago for diabetes care however, unfortunately she disappeared from care for unclear reasons. She is being rereferred.   Her diabetes is complicated by coronary artery disease, chronic kidney disease, peripheral neuropathy, and patient remains at a high risk for more acute and chronic complications which include CAD, CVA, CKD, retinopathy, and neuropathy. These are all discussed in detail with the patient.  - I have counseled the patient on diet management  by adopting a carbohydrate restricted/protein rich diet.  - Suggestion is made for patient to avoid simple carbohydrates   from her diet including Cakes , Desserts, Ice Cream,  Soda (  diet and regular) , Sweet Tea , Candies,  Chips, Cookies, Artificial Sweeteners,   and "Sugar-free" Products . This will help patient to have stable blood glucose profile and potentially avoid unintended weight gain.  - I encouraged the patient to switch to  unprocessed or minimally processed complex starch and increased protein  intake (animal or plant source), fruits, and vegetables.  - Patient is advised to stick to a routine mealtimes to eat 3 meals  a day and avoid unnecessary snacks ( to snack only to correct hypoglycemia).  - The patient will be scheduled with Jearld Fenton, RDN, CDE for individualized DM education.  - I have approached patient with the following individualized plan to manage diabetes and patient agrees:   - Given her recent glycemic burden with A1c > 14%, she would likely require basal/bolus insulin to treat her diabetes. - However, she did not bring adequate monitoring data to  make a decision on her long-term treatment. - The few readings she has in her meter shows average of 173-250. - I will proceed to readjust her basal insulin Lantus to 60 units daily at bedtime, initiate strict monitoring of blood glucose 4 times a day-before meals and at bedtime and she is requested to return in one week with her meter and logs for reevaluation.  - Patient is warned not to take insulin without proper monitoring per orders.  -Patient is encouraged to call clinic for blood glucose levels less than 70 or above 300 mg /dl. - She is not a suitable candidate for metformin, SGLT2 inhibitors, nor incretin therapy . - Her exclusive choice of therapy is insulin which requires her commitment for proper monitoring for safe use. - Patient specific target  A1c;  LDL, HDL, Triglycerides, and  Waist Circumference were discussed in detail.  2) BP/HTN: Uncontrolled. Continue current medications including ACEI/ARB. 3) Lipids/HPL:   Control unknown.   Patient is advised to continue statins. 4)  Weight/Diet: CDE Consult will be initiated , exercise, and detailed carbohydrates information provided.  5) Chronic Care/Health Maintenance:  -Patient is on ACEI/ARB and Statin medications and encouraged to continue to follow up with Ophthalmology, Podiatrist at least yearly or according to recommendations, and advised to   stay  away from smoking. I have recommended yearly flu vaccine and pneumonia vaccination at least every 5 years; moderate intensity exercise for up to 150 minutes weekly; and  sleep for at least 7 hours a day.  - 60 minutes of time was spent on the care of this patient , 50% of which was applied for counseling on diabetes complications and their preventions.  - Patient to bring meter and  blood glucose logs during her next visit.   - I advised patient to maintain close follow up with Timmothy Euler, MD for primary care needs.  Follow up plan: - Return in about 1 week (around 12/31/2016) for follow up with meter and logs- no labs.  Glade Lloyd, MD Phone: [phone]  Fax: [phone]   12/24/2016, 2:51 PM

## 2016-12-27 ENCOUNTER — Encounter (INDEPENDENT_AMBULATORY_CARE_PROVIDER_SITE_OTHER): Payer: Self-pay

## 2016-12-27 ENCOUNTER — Ambulatory Visit (INDEPENDENT_AMBULATORY_CARE_PROVIDER_SITE_OTHER): Payer: Medicare Other | Admitting: Pharmacist

## 2016-12-27 VITALS — BP 136/60 | HR 78 | Ht 67.0 in | Wt 156.0 lb

## 2016-12-27 DIAGNOSIS — E1121 Type 2 diabetes mellitus with diabetic nephropathy: Secondary | ICD-10-CM

## 2016-12-27 DIAGNOSIS — Z794 Long term (current) use of insulin: Secondary | ICD-10-CM

## 2016-12-27 DIAGNOSIS — E1165 Type 2 diabetes mellitus with hyperglycemia: Secondary | ICD-10-CM | POA: Diagnosis not present

## 2016-12-27 DIAGNOSIS — IMO0002 Reserved for concepts with insufficient information to code with codable children: Secondary | ICD-10-CM

## 2016-12-27 NOTE — Patient Instructions (Signed)
Goal Blood glucose:    Fasting (before meals) = 80 to 130   Within 2 hours of eating = less than 180  Foods you can eat more freely Proteins:   Fish  Chicken or [place]  Beef or pork (1 or 2 servings per week)  Eggs  Nuts (peanuts, walnuts, almonds, pistachios)  Cheese  Non starchy vegetables:  Green beans  Broccoli or cauliflower  Lettuce, greens, cabbage  Brussel Sprout  Carrots  Onions and peppers  Celery  Tomatoes  Asparagus  Eggplant  Cucumbers  Squash and Zucchini

## 2016-12-27 NOTE — Progress Notes (Signed)
Patient ID: Erica Crosby, female   DOB: 11-28-1945, 71 y.o.   MRN: [id number]   Subjective:    Erica Crosby is a 71 y.o. female who presents for an follow up evaluation of Type 2 diabetes mellitus requiring insulin therapy.  This appointment has been set up prior to patient seeing Dr Dorris Fetch, endocrinologist.  She saw Dr Dorris Fetch 12/24/2016.   Current Medications for DM include: Lantus 60 units qd.  Metformin stopped 12/24/16  Patient has been compliant with Lantus since that visit.  She reports that her energy level is improving.  Did not bring in glucometer but she is checking 1-2 itmes per days.  Ranges from 120 to 300.  About 1 month ago patient was frequently having BG in the 400's .    Current Outpatient Prescriptions:  .  alendronate (FOSAMAX) 70 MG tablet, Take 1 tablet by mouth once a week., Disp: , Rfl:  .  aspirin EC 81 MG tablet, Take 81 mg by mouth daily., Disp: , Rfl:  .  atorvastatin (LIPITOR) 10 MG tablet, Take 1 tablet (10 mg total) by mouth daily., Disp: 90 tablet, Rfl: 0 .  blood glucose meter kit and supplies KIT, Dispense based on patient and insurance preference. Use up to three (3) times daily as directed. (FOR insulin dependent diabetes - E11.79), Disp: 1 each, Rfl: 0 .  ciprofloxacin (CIPRO) 500 MG tablet, Take 500 mg by mouth daily with breakfast., Disp: , Rfl:  .  enalapril (VASOTEC) 20 MG tablet, Take 2 tablets (40 mg total) by mouth daily., Disp: 180 tablet, Rfl: 0 .  ergocalciferol (VITAMIN D2) 50000 units capsule, Take 50,000 Units by mouth every 14 (fourteen) days. , Disp: , Rfl:  .  folic acid (FOLVITE) 1 MG tablet, Take 1 tablet (1 mg total) by mouth daily., Disp: 90 tablet, Rfl: 3 .  gabapentin (NEURONTIN) 400 MG capsule, Take 1 capsule (400 mg total) by mouth at bedtime., Disp: 90 capsule, Rfl: 0 .  Insulin Glargine (LANTUS SOLOSTAR) 100 UNIT/ML Solostar Pen, Inject 60 Units into the skin daily., Disp: 5 pen, Rfl: 2 .  methotrexate (RHEUMATREX) 2.5 MG tablet, Take 4  tablets by mouth every 7 (seven) days. , Disp: , Rfl:  .  ONE TOUCH ULTRA TEST test strip, , Disp: , Rfl:  .  ONETOUCH DELICA LANCETS 22W MISC, , Disp: , Rfl:  .  pantoprazole (PROTONIX) 40 MG tablet, Take 1 tablet (40 mg total) by mouth daily., Disp: 90 tablet, Rfl: 1 .  predniSONE (DELTASONE) 5 MG tablet, Take 5 mg by mouth daily., Disp: , Rfl:  .  ENBREL 50 MG/ML injection, , Disp: , Rfl:  - has not started yet.  Rheumatologist has discussed starting Embrel but pt had not been contacted that Rx was filled or is ready for pick up.     Objective:    BP 136/60   Pulse 78   Ht '5\' 7"'  (1.702 m)   Wt 156 lb (70.8 kg)   BMI 24.43 kg/m   RBG in office today = 124  A1c = 12.5% (12/12/2016) A1c = over 14% (10/31/2016)    Assessment:    Diabetes Mellitus type II, under inadequate but improving control.    Plan:    1.  Rx changes: none -advised patient continue with plan per Dr Dorris Fetch  I did call [facility] to see if Erica Crosby had been filled there but it had not.  I recommended patient contact Dr Dossie Der regarding Erica Crosby Rx and administration  education 2.  Education: Reviewed 'ABCs' of diabetes management (respective goals in parentheses):  A1C (<7), blood pressure (<130/80), and cholesterol (LDL <100). 3. Patient to see nutritionist in 2 weeks  4.  Recommend check BG 2-4   times a day.  Reviewed BG goals with patient. 5.  Follow up with PCP and endocrinologist as planned.  No need for follow up with me unless patient has specific medication questions.

## 2016-12-31 ENCOUNTER — Ambulatory Visit (INDEPENDENT_AMBULATORY_CARE_PROVIDER_SITE_OTHER): Payer: Medicare Other | Admitting: "Endocrinology

## 2016-12-31 ENCOUNTER — Encounter: Payer: Self-pay | Admitting: "Endocrinology

## 2016-12-31 VITALS — BP 134/83 | HR 100 | Ht 67.0 in | Wt 156.0 lb

## 2016-12-31 DIAGNOSIS — E1121 Type 2 diabetes mellitus with diabetic nephropathy: Secondary | ICD-10-CM | POA: Diagnosis not present

## 2016-12-31 DIAGNOSIS — IMO0002 Reserved for concepts with insufficient information to code with codable children: Secondary | ICD-10-CM

## 2016-12-31 DIAGNOSIS — E1165 Type 2 diabetes mellitus with hyperglycemia: Secondary | ICD-10-CM

## 2016-12-31 DIAGNOSIS — Z794 Long term (current) use of insulin: Secondary | ICD-10-CM | POA: Diagnosis not present

## 2016-12-31 DIAGNOSIS — I10 Essential (primary) hypertension: Secondary | ICD-10-CM | POA: Diagnosis not present

## 2016-12-31 MED ORDER — GLUCOSE BLOOD VI STRP: ORAL_STRIP | 3 refills | Status: DC

## 2016-12-31 NOTE — Progress Notes (Signed)
Subjective:    Patient ID: Erica Crosby, female    DOB: Nov 02, 1945. Patient is being seen in consultation for management of diabetes requested by  Timmothy Euler, MD  Past Medical History:  Diagnosis Date  . Arthritis   . Cataract   . Coronary artery disease    Mild plaque 2012  . Diabetes mellitus   . GERD (gastroesophageal reflux disease)   . Hyperlipidemia   . Hypertension   . Neuropathy   . Peptic ulcer   . Rheumatoid arthritis [facility])    Past Surgical History:  Procedure Laterality Date  . ABSCESS DRAINAGE    . CARPAL TUNNEL RELEASE Left   . CHOLECYSTECTOMY    . KNEE SURGERY Right   . NECK SURGERY     x 2  . SHOULDER SURGERY Left    Social History   Social History  . Marital status: Widowed    Spouse name: N/A  . Number of children: 3  . Years of education: N/A   Social History Main Topics  . Smoking status: Former Smoker    Years: 25.00    Types: Cigarettes, E-cigarettes    Quit date: 02/02/2013  . Smokeless tobacco: Never Used  . Alcohol use No  . Drug use: No  . Sexual activity: Not Currently    Birth control/ protection: Post-menopausal   Other Topics Concern  . None   Social History Narrative   Daughter stays with her.    Outpatient Encounter Prescriptions as of 12/31/2016  Medication Sig  . alendronate (FOSAMAX) 70 MG tablet Take 1 tablet by mouth once a week.  Marland Kitchen aspirin EC 81 MG tablet Take 81 mg by mouth daily.  Marland Kitchen atorvastatin (LIPITOR) 10 MG tablet Take 1 tablet (10 mg total) by mouth daily.  . blood glucose meter kit and supplies KIT Dispense based on patient and insurance preference. Use up to three (3) times daily as directed. (FOR insulin dependent diabetes - E11.79)  . enalapril (VASOTEC) 20 MG tablet Take 2 tablets (40 mg total) by mouth daily.  . ENBREL 50 MG/ML injection   . ergocalciferol (VITAMIN D2) 50000 units capsule Take 50,000 Units by mouth every 14 (fourteen) days.   . folic acid (FOLVITE) 1 MG tablet Take 1 tablet (1  mg total) by mouth daily.  Marland Kitchen gabapentin (NEURONTIN) 400 MG capsule Take 1 capsule (400 mg total) by mouth at bedtime.  Marland Kitchen glucose blood (ACCU-CHEK AVIVA) test strip Used to test blood glucose 2 times a day.  . Insulin Glargine (LANTUS SOLOSTAR) 100 UNIT/ML Solostar Pen Inject 60 Units into the skin daily.  . methotrexate (RHEUMATREX) 2.5 MG tablet Take 4 tablets by mouth every 7 (seven) days.   . ONE TOUCH ULTRA TEST test strip   . ONETOUCH DELICA LANCETS 16X MISC   . pantoprazole (PROTONIX) 40 MG tablet Take 1 tablet (40 mg total) by mouth daily.  . predniSONE (DELTASONE) 5 MG tablet Take 5 mg by mouth daily.  . [DISCONTINUED] ciprofloxacin (CIPRO) 500 MG tablet Take 500 mg by mouth daily with breakfast.   No facility-administered encounter medications on file as of 12/31/2016.    ALLERGIES: No Known Allergies VACCINATION STATUS: Immunization History  Administered Date(s) Administered  . Influenza,inj,Quad PF,36+ Mos 03/18/2016    Diabetes  She presents for her follow-up diabetic visit. She has type 2 diabetes mellitus. Onset time: She was diagnosed at approximate age of 47 years. Her disease course has been improving. There are no hypoglycemic associated  symptoms. Pertinent negatives for hypoglycemia include no confusion, headaches, pallor or seizures. Associated symptoms include blurred vision, fatigue, foot paresthesias, polydipsia, polyphagia and visual change. Pertinent negatives for diabetes include no chest pain and no polyuria. There are no hypoglycemic complications. Symptoms are improving. Diabetic complications include heart disease and nephropathy. Risk factors for coronary artery disease include dyslipidemia, diabetes mellitus, hypertension, tobacco exposure and sedentary lifestyle. Current diabetic treatment includes insulin injections. She is compliant with treatment none of the time. Her weight is stable. She is following a generally unhealthy diet. When asked about meal  planning, she reported none. She has not had a previous visit with a dietitian. She never participates in exercise. There is no change in her home blood glucose trend. Her overall blood glucose range is 180-200 mg/dl. (She did not monitor blood glucose 4 times a day. She brought a meter showing average blood glucose of 182 for the last 7 days (number of tests 12).) An ACE inhibitor/angiotensin II receptor blocker is being taken. Eye exam is current.  Hyperlipidemia  This is a chronic problem. The current episode started more than 1 year ago. The problem is uncontrolled. Pertinent negatives include no chest pain, myalgias or shortness of breath. Current antihyperlipidemic treatment includes statins. Risk factors for coronary artery disease include dyslipidemia, diabetes mellitus, hypertension and a sedentary lifestyle.  Hypertension  This is a chronic problem. The current episode started more than 1 year ago. Associated symptoms include blurred vision. Pertinent negatives include no chest pain, headaches, palpitations or shortness of breath. Risk factors for coronary artery disease include dyslipidemia, diabetes mellitus, smoking/tobacco exposure and sedentary lifestyle. Past treatments include ACE inhibitors.       Review of Systems  Constitutional: Positive for fatigue. Negative for chills, fever and unexpected weight change.  HENT: Negative for trouble swallowing and voice change.   Eyes: Positive for blurred vision. Negative for visual disturbance.  Respiratory: Negative for cough, shortness of breath and wheezing.   Cardiovascular: Negative for chest pain, palpitations and leg swelling.  Gastrointestinal: Negative for diarrhea, nausea and vomiting.  Endocrine: Positive for polydipsia and polyphagia. Negative for cold intolerance, heat intolerance and polyuria.  Musculoskeletal: Negative for arthralgias and myalgias.  Skin: Negative for color change, pallor, rash and wound.  Neurological:  Negative for seizures and headaches.  Psychiatric/Behavioral: Negative for confusion and suicidal ideas.    Objective:    BP 134/83   Pulse 100   Ht '5\' 7"'  (1.702 m)   Wt 156 lb (70.8 kg)   BMI 24.43 kg/m   Wt Readings from Last 3 Encounters:  12/31/16 156 lb (70.8 kg)  12/27/16 156 lb (70.8 kg)  12/24/16 155 lb (70.3 kg)    Physical Exam  Constitutional: She is oriented to person, place, and time. She appears well-developed.  HENT:  Head: Normocephalic and atraumatic.  Eyes: EOM are normal.  Neck: Normal range of motion. Neck supple. No tracheal deviation present. No thyromegaly present.  Cardiovascular: Normal rate and regular rhythm.   Pulmonary/Chest: Effort normal and breath sounds normal.  Abdominal: Soft. Bowel sounds are normal. There is no tenderness. There is no guarding.  Musculoskeletal: Normal range of motion. She exhibits no edema.  Neurological: She is alert and oriented to person, place, and time. She has normal reflexes. No cranial nerve deficit. Coordination normal.  Skin: Skin is warm and dry. No rash noted. No erythema. No pallor.  Psychiatric: She has a normal mood and affect. Judgment normal.     CMP  Component Value Date/Time   NA 129 (L) 11/14/2016 1627   NA 128 (L) 11/13/2016 1425   K 4.6 11/14/2016 1627   CL 98 (L) 11/14/2016 1627   CO2 22 11/14/2016 1627   GLUCOSE 472 (H) 11/14/2016 1627   BUN 43 (H) 11/14/2016 1627   BUN 34 (H) 11/13/2016 1425   CREATININE 1.96 (H) 11/14/2016 1627   CALCIUM 8.5 (L) 11/14/2016 1627   PROT 6.2 07/17/2016 0842   ALBUMIN 3.6 07/17/2016 0842   AST 12 07/17/2016 0842   ALT 10 07/17/2016 0842   ALKPHOS 95 07/17/2016 0842   BILITOT 0.4 07/17/2016 0842   GFRNONAA 25 (L) 11/14/2016 1627   GFRAA 29 (L) 11/14/2016 1627     Diabetic Labs (most recent): Lab Results  Component Value Date   HGBA1C 14 10/31/2016   HGBA1C 9.9 (H) 02/10/2012     Lipid Panel ( most recent) Lipid Panel     Component Value  Date/Time   CHOL 125 07/17/2016 0842   TRIG 75 07/17/2016 0842   HDL 53 07/17/2016 0842   CHOLHDL 2.4 07/17/2016 0842   CHOLHDL 3.3 02/10/2012 0520   VLDL 17 02/10/2012 0520   LDLCALC 57 07/17/2016 0842       Assessment & Plan:   1. Uncontrolled type 2 diabetes mellitus with diabetic nephropathy, with long-term current use of insulin ([place])  - Patient has currently uncontrolled symptomatic type 2 DM since  71 years of age. - She came with better average blood glucose of 182, however her most recent A1c was greater than 14 %. Recent labs reviewed.   Her diabetes is complicated by coronary artery disease, chronic kidney disease, peripheral neuropathy, and patient remains at a high risk for more acute and chronic complications which include CAD, CVA, CKD, retinopathy, and neuropathy. These are all discussed in detail with the patient.  - I have counseled the patient on diet management  by adopting a carbohydrate restricted/protein rich diet.  - Suggestion is made for patient to avoid simple carbohydrates   from her diet including Cakes , Desserts, Ice Cream,  Soda (  diet and regular) , Sweet Tea , Candies,  Chips, Cookies, Artificial Sweeteners,   and "Sugar-free" Products . This will help patient to have stable blood glucose profile and potentially avoid unintended weight gain.  - I encouraged the patient to switch to  unprocessed or minimally processed complex starch and increased protein intake (animal or plant source), fruits, and vegetables.  - Patient is advised to stick to a routine mealtimes to eat 3 meals  a day and avoid unnecessary snacks ( to snack only to correct hypoglycemia).  - The patient will be scheduled with Jearld Fenton, RDN, CDE for individualized DM education.  - I have approached patient with the following individualized plan to manage diabetes and patient agrees:   - Given her recent glycemic burden with A1c > 14%, she would likely require basal/bolus insulin  to treat her diabetes. - However, she did not bring adequate monitoring data to make a decision on her long-term treatment. - The few readings she has in her meter shows average of 182. - I will proceed only with her  basal insulin Lantus 60 units daily at bedtime, strict monitoring of blood glucose 2 times a day-before breakfast and at bedtime and she is requested to return in 6 weeks with repeat labs and her meter and logs.  - Patient is warned not to take insulin without proper monitoring per orders.  -  Patient is encouraged to call clinic for blood glucose levels less than 70 or above 300 mg /dl. - She is not a suitable candidate for metformin, SGLT2 inhibitors, nor incretin therapy . - Her exclusive choice of therapy is insulin which requires her commitment for proper monitoring for safe use. - Patient specific target  A1c;  LDL, HDL, Triglycerides, and  Waist Circumference were discussed in detail.  2) BP/HTN: Uncontrolled. Continue current medications including ACEI/ARB. 3) Lipids/HPL:   Control unknown.   Patient is advised to continue statins. 4)  Weight/Diet: CDE Consult will be initiated , exercise, and detailed carbohydrates information provided.  5) Chronic Care/Health Maintenance:  -Patient is on ACEI/ARB and Statin medications and encouraged to continue to follow up with Ophthalmology, Podiatrist at least yearly or according to recommendations, and advised to   stay away from smoking. I have recommended yearly flu vaccine and pneumonia vaccination at least every 5 years; moderate intensity exercise for up to 150 minutes weekly; and  sleep for at least 7 hours a day.  - 30 minutes of time was spent on the care of this patient , 50% of which was applied for counseling on diabetes complications and their preventions.  - Patient to bring meter and  blood glucose logs during her next visit.   - I advised patient to maintain close follow up with Timmothy Euler, MD for primary  care needs.  Follow up plan: - Return in about 6 weeks (around 02/11/2017) for meter, and logs.  Glade Lloyd, MD Phone: [phone]  Fax: [phone]   12/31/2016, 3:44 PM

## 2017-01-17 ENCOUNTER — Other Ambulatory Visit: Payer: Self-pay | Admitting: *Deleted

## 2017-01-17 ENCOUNTER — Other Ambulatory Visit: Payer: Self-pay | Admitting: Family Medicine

## 2017-01-17 MED ORDER — ATORVASTATIN CALCIUM 10 MG PO TABS: 10.0000 mg | ORAL_TABLET | Freq: Every day | ORAL | 1 refills | Status: DC

## 2017-01-21 ENCOUNTER — Encounter: Payer: Medicare Other | Attending: "Endocrinology | Admitting: Nutrition

## 2017-01-21 ENCOUNTER — Other Ambulatory Visit: Payer: Self-pay

## 2017-01-21 ENCOUNTER — Other Ambulatory Visit: Payer: Self-pay | Admitting: "Endocrinology

## 2017-01-21 VITALS — Wt 155.0 lb

## 2017-01-21 DIAGNOSIS — Z713 Dietary counseling and surveillance: Secondary | ICD-10-CM | POA: Diagnosis not present

## 2017-01-21 DIAGNOSIS — E1165 Type 2 diabetes mellitus with hyperglycemia: Secondary | ICD-10-CM | POA: Insufficient documentation

## 2017-01-21 DIAGNOSIS — Z794 Long term (current) use of insulin: Secondary | ICD-10-CM | POA: Insufficient documentation

## 2017-01-21 DIAGNOSIS — E118 Type 2 diabetes mellitus with unspecified complications: Secondary | ICD-10-CM

## 2017-01-21 DIAGNOSIS — E1121 Type 2 diabetes mellitus with diabetic nephropathy: Secondary | ICD-10-CM | POA: Insufficient documentation

## 2017-01-21 DIAGNOSIS — IMO0002 Reserved for concepts with insufficient information to code with codable children: Secondary | ICD-10-CM

## 2017-01-21 MED ORDER — METHOTREXATE 2.5 MG PO TABS: 10.0000 mg | ORAL_TABLET | ORAL | 2 refills | Status: DC

## 2017-01-21 MED ORDER — INSULIN GLARGINE 100 UNIT/ML SOLOSTAR PEN: 60.0000 [IU] | PEN_INJECTOR | Freq: Every day | SUBCUTANEOUS | 2 refills | Status: DC

## 2017-01-21 NOTE — Progress Notes (Signed)
Medical Nutrition Therapy:  Appt start time: 2542 end time:  1630.   Assessment:  Primary concerns today: Diabetes Type 2. Was seen once before in 2016 and then didn't show up for followup. Sees DR. Nida. Her daughter in law brought her today but didn't come in for the visit. Most recent A1C 14%. Her son and daughter live with her. She wants to remain independent and do her own medications.  Eats 2-3 meals a day but typically doesn't get up til later in the midmorning 10-11 am. Meal not consistent.      Appears to be sluggish mentally. Battery in her accucheck meter is loose and sometimes won't read BS. BS this am was 245 mg/dl.  Sometimes sleeps through dinner and doesn't eat. Taking 60 units of Lantus daily in her abdomen. Had been on meal time insulin but has been advised not to take any right now. She had gotten a sample from her PCP, but I discussed the dangers of using it since it wasn't ordered by Dr. Dorris Fetch.    Compliance with meds and meal pattern is problem. Her chiildren assist with meals but she doesn't always follow recommendations of what to eat.     She notes she has a callous on bottom of her right heel. Advised to have it evaluated by PCP. She is willing to do better with meals and compliance with medications.    Current diet is inconsistent to meet her needs. She needs more assistance with meals and medications but won't let her family help her.     She notes she has been on some steriods that make her BS go up. She notes she has fallen a few times this past year. No series injuries. Loses her balance. Lab Results  Component Value Date   HGBA1C 14 10/31/2016      Preferred Learning Style:   No preference indicated   Learning Readiness:   Ready  Change in progress   MEDICATIONS:    DIETARY INTAKE:    24-hr recall:  B ( AM): eggs and sausage and 1 slice toast, water Snk ( AM):   L ( PM): skips due to late breakfast Or may have a sandwich  Snk ( PM):  D ( PM):  steak, broccoli sometimes., water  Snk ( PM): water Beverages: Water  Usual physical activity: ADL   Estimated energy needs: 1200  calories 135 g carbohydrates 90 g protein 33 g fat  Progress Towards Goal(s):  In progress.   Nutritional Diagnosis:  NB-3.2 Limited access to food or water As related to  Diabetes.  As evidenced by A1C 14%.    Intervention:  Nutrition and Diabetes education provided on My Plate, CHO counting, meal planning, portion sizes, timing of meals, avoiding snacks between meals unless having a low blood sugar, target ranges for A1C and blood sugars, signs/symptoms and treatment of hyper/hypoglycemia, monitoring blood sugars, taking medications as prescribed, benefits of exercising 30 minutes per day and prevention of complications of DM.  Goals 1. Follow My Plate 2. Eat 3 balanced meals a day at times discussed. 3. Do not skip mealls 4. Eat breakfast by 9 am. 5. Give self 60 units of Lantus in abdomen area at 8 pm at night. 6. Drink 5-6 bottles of water per day. DO NOT TAKE INSULIN WITH MEALS>Humalog or Novolog. Dr. Dorris Fetch will call in prescription for your Lantus pen. Test blood sugar before breakfast and at bedtime daily and record on log sheets. Bring meter and BS  log to all appointments.  Teaching Method Utilized:  Visual Auditory Hands on  Handouts given during visit include:  The Plate Method  Meal Plan Card  Diabetes Instructions.   Barriers to learning/adherence to lifestyle change: none  Demonstrated degree of understanding via:  Teach Back   Monitoring/Evaluation:  Dietary intake, exercise, meal planning, SBG, and body weight in 1 month(s).

## 2017-01-21 NOTE — Patient Instructions (Signed)
Goals 1. Follow My Plate 2. Eat 3 balanced meals a day at times discussed. 3. Do not skip mealls 4. Eat breakfast by 9 am. 5. Give self 60 units of Lantus in abdomen area at 8 pm at night. 6. Drink 5-6 bottles of water per day. DO NOT TAKE INSULIN WITH MEALS>Humalog or Novolog. Dr. Dorris Fetch will call in prescription for your Lantus pen. Test blood sugar before breakfast and at bedtime daily and record on log sheets. Bring meter and BS log to all appointments.

## 2017-01-22 ENCOUNTER — Telehealth: Payer: Self-pay | Admitting: Family Medicine

## 2017-01-22 ENCOUNTER — Other Ambulatory Visit: Payer: Self-pay | Admitting: Family Medicine

## 2017-01-22 MED ORDER — ACCU-CHEK AVIVA DEVI: 0 refills | Status: DC

## 2017-01-22 MED ORDER — ACCU-CHEK MULTICLIX LANCETS MISC: 5 refills | Status: DC

## 2017-01-22 NOTE — Telephone Encounter (Signed)
Patient came into office to ask about refills.  Rx was sent in for new glucometer and lancets. Patient already has refills on folic acid. Spoke with Gabriel Cirri at [facility] and she will refill folic acid.  There also is Rx for Lantus at [facility] for patient that was sent in 01/21/17.  That was also requested to be filled.

## 2017-01-23 ENCOUNTER — Other Ambulatory Visit: Payer: Self-pay

## 2017-01-23 MED ORDER — GLUCOSE BLOOD VI STRP: ORAL_STRIP | 3 refills | Status: DC

## 2017-01-23 NOTE — Telephone Encounter (Signed)
Resent test strips in to [facility]

## 2017-01-23 NOTE — Telephone Encounter (Signed)
Dr Dorris Fetch send in Rx for test strip 12/31/2016 for #100 and 3 refills.  Please check with [facility] to see if they have this Rx on file already.  If not then can call or send another Rx in.

## 2017-02-12 ENCOUNTER — Other Ambulatory Visit: Payer: Self-pay | Admitting: "Endocrinology

## 2017-02-12 DIAGNOSIS — E1121 Type 2 diabetes mellitus with diabetic nephropathy: Secondary | ICD-10-CM | POA: Diagnosis not present

## 2017-02-12 DIAGNOSIS — Z794 Long term (current) use of insulin: Secondary | ICD-10-CM | POA: Diagnosis not present

## 2017-02-12 DIAGNOSIS — E1165 Type 2 diabetes mellitus with hyperglycemia: Secondary | ICD-10-CM | POA: Diagnosis not present

## 2017-02-12 LAB — RENAL FUNCTION PANEL
ALBUMIN: 3.7 g/dL (ref 3.6–5.1)
BUN: 26 mg/dL — AB (ref 7–25)
CALCIUM: 8.8 mg/dL (ref 8.6–10.4)
CO2: 25 mmol/L (ref 20–32)
Chloride: 108 mmol/L (ref 98–110)
Creat: 1.41 mg/dL — ABNORMAL HIGH (ref 0.50–0.99)
Glucose, Bld: 116 mg/dL — ABNORMAL HIGH (ref 65–99)
POTASSIUM: 3.6 mmol/L (ref 3.5–5.3)
Phosphorus: 3.7 mg/dL (ref 2.1–4.3)
Sodium: 142 mmol/L (ref 135–146)

## 2017-02-13 ENCOUNTER — Encounter: Payer: Medicare Other | Attending: Family Medicine | Admitting: Nutrition

## 2017-02-13 ENCOUNTER — Encounter: Payer: Self-pay | Admitting: "Endocrinology

## 2017-02-13 ENCOUNTER — Ambulatory Visit (INDEPENDENT_AMBULATORY_CARE_PROVIDER_SITE_OTHER): Payer: Medicare Other | Admitting: "Endocrinology

## 2017-02-13 VITALS — BP 151/84 | HR 90 | Ht 67.0 in | Wt 160.0 lb

## 2017-02-13 VITALS — Wt 160.0 lb

## 2017-02-13 DIAGNOSIS — E1121 Type 2 diabetes mellitus with diabetic nephropathy: Secondary | ICD-10-CM | POA: Insufficient documentation

## 2017-02-13 DIAGNOSIS — Z794 Long term (current) use of insulin: Secondary | ICD-10-CM | POA: Diagnosis not present

## 2017-02-13 DIAGNOSIS — Z713 Dietary counseling and surveillance: Secondary | ICD-10-CM | POA: Diagnosis not present

## 2017-02-13 DIAGNOSIS — E782 Mixed hyperlipidemia: Secondary | ICD-10-CM

## 2017-02-13 DIAGNOSIS — I10 Essential (primary) hypertension: Secondary | ICD-10-CM

## 2017-02-13 DIAGNOSIS — E1165 Type 2 diabetes mellitus with hyperglycemia: Secondary | ICD-10-CM

## 2017-02-13 DIAGNOSIS — E118 Type 2 diabetes mellitus with unspecified complications: Secondary | ICD-10-CM

## 2017-02-13 DIAGNOSIS — IMO0002 Reserved for concepts with insufficient information to code with codable children: Secondary | ICD-10-CM

## 2017-02-13 LAB — HEMOGLOBIN A1C
HEMOGLOBIN A1C: 8.5 % — AB (ref <5.7)
Mean Plasma Glucose: 197 mg/dL

## 2017-02-13 MED ORDER — INSULIN GLARGINE 100 UNIT/ML SOLOSTAR PEN: 50.0000 [IU] | PEN_INJECTOR | Freq: Every day | SUBCUTANEOUS | 2 refills | Status: DC

## 2017-02-13 NOTE — Patient Instructions (Addendum)
Goals 1 Eat thee meals per day 2. Take 50 units of Lantus a day instead of 60 units. 3. Continue to increase fresh fruits and vegetables 4. Don't skip meals--be sure to eat breakfast by 9 am.

## 2017-02-13 NOTE — Progress Notes (Signed)
Subjective:    Patient ID: Erica Crosby, female    DOB: 1946/01/18. Patient is being seen in consultation for management of diabetes requested by  Timmothy Euler, MD  Past Medical History:  Diagnosis Date  . Arthritis   . Cataract   . Coronary artery disease    Mild plaque 2012  . Diabetes mellitus   . GERD (gastroesophageal reflux disease)   . Hyperlipidemia   . Hypertension   . Neuropathy   . Peptic ulcer   . Rheumatoid arthritis [facility])    Past Surgical History:  Procedure Laterality Date  . ABSCESS DRAINAGE    . CARPAL TUNNEL RELEASE Left   . CHOLECYSTECTOMY    . KNEE SURGERY Right   . NECK SURGERY     x 2  . SHOULDER SURGERY Left    Social History   Social History  . Marital status: Widowed    Spouse name: N/A  . Number of children: 3  . Years of education: N/A   Social History Main Topics  . Smoking status: Former Smoker    Years: 25.00    Types: Cigarettes, E-cigarettes    Quit date: 02/02/2013  . Smokeless tobacco: Never Used  . Alcohol use No  . Drug use: No  . Sexual activity: Not Currently    Birth control/ protection: Post-menopausal   Other Topics Concern  . None   Social History Narrative   Daughter stays with her.    Outpatient Encounter Prescriptions as of 02/13/2017  Medication Sig  . alendronate (FOSAMAX) 70 MG tablet Take 1 tablet by mouth once a week.  Marland Kitchen aspirin EC 81 MG tablet Take 81 mg by mouth daily.  Marland Kitchen atorvastatin (LIPITOR) 10 MG tablet Take 1 tablet (10 mg total) by mouth daily.  . blood glucose meter kit and supplies KIT Dispense based on patient and insurance preference. Use up to three (3) times daily as directed. (FOR insulin dependent diabetes - E11.79)  . Blood Glucose Monitoring Suppl (ACCU-CHEK AVIVA) device Use to check BG BID. Dx: E11.8, E11.65, Z79.4  . enalapril (VASOTEC) 20 MG tablet Take 2 tablets (40 mg total) by mouth daily.  . ENBREL 50 MG/ML injection once a week.   . ergocalciferol (VITAMIN D2) 50000  units capsule Take 50,000 Units by mouth every 14 (fourteen) days.   . folic acid (FOLVITE) 1 MG tablet Take 1 tablet (1 mg total) by mouth daily.  Marland Kitchen gabapentin (NEURONTIN) 400 MG capsule Take 1 capsule (400 mg total) by mouth at bedtime.  Marland Kitchen glucose blood (ACCU-CHEK AVIVA) test strip Used to test blood glucose 2 times a day.  . Insulin Glargine (LANTUS SOLOSTAR) 100 UNIT/ML Solostar Pen Inject 60 Units into the skin daily.  . Lancets (ACCU-CHEK MULTICLIX) lancets Use to check BG bid (Dx: E11.8, E11.65, Z79.4 - type 2 dm requiring insulin therapy)  . methotrexate (RHEUMATREX) 2.5 MG tablet Take 4 tablets (10 mg total) by mouth every 7 (seven) days.  . pantoprazole (PROTONIX) 40 MG tablet Take 1 tablet (40 mg total) by mouth daily.  . predniSONE (DELTASONE) 5 MG tablet Take 5 mg by mouth daily.   No facility-administered encounter medications on file as of 02/13/2017.    ALLERGIES: No Known Allergies VACCINATION STATUS: Immunization History  Administered Date(s) Administered  . Influenza,inj,Quad PF,6+ Mos 03/18/2016    Diabetes  She presents for her follow-up diabetic visit. She has type 2 diabetes mellitus. Onset time: She was diagnosed at approximate age of 71  years. Her disease course has been improving. There are no hypoglycemic associated symptoms. Pertinent negatives for hypoglycemia include no confusion, headaches, pallor or seizures. Associated symptoms include blurred vision, fatigue, foot paresthesias and visual change. Pertinent negatives for diabetes include no chest pain, no polydipsia, no polyphagia and no polyuria. There are no hypoglycemic complications. Symptoms are improving. Diabetic complications include heart disease and nephropathy. Risk factors for coronary artery disease include dyslipidemia, diabetes mellitus, hypertension, tobacco exposure and sedentary lifestyle. Current diabetic treatment includes insulin injections. She is compliant with treatment none of the time. Her  weight is increasing steadily. She is following a generally unhealthy diet. When asked about meal planning, she reported none. She has not had a previous visit with a dietitian. She never participates in exercise. There is no change in her home blood glucose trend. Her breakfast blood glucose range is generally 130-140 mg/dl. Her lunch blood glucose range is generally 140-180 mg/dl. Her dinner blood glucose range is generally 180-200 mg/dl. Her bedtime blood glucose range is generally 180-200 mg/dl. Her overall blood glucose range is 180-200 mg/dl. An ACE inhibitor/angiotensin II receptor blocker is being taken. Eye exam is current.  Hyperlipidemia  This is a chronic problem. The current episode started more than 1 year ago. The problem is uncontrolled. Pertinent negatives include no chest pain, myalgias or shortness of breath. Current antihyperlipidemic treatment includes statins. Risk factors for coronary artery disease include dyslipidemia, diabetes mellitus, hypertension and a sedentary lifestyle.  Hypertension  This is a chronic problem. The current episode started more than 1 year ago. Associated symptoms include blurred vision. Pertinent negatives include no chest pain, headaches, palpitations or shortness of breath. Risk factors for coronary artery disease include dyslipidemia, diabetes mellitus, smoking/tobacco exposure and sedentary lifestyle. Past treatments include ACE inhibitors.    Review of Systems  Constitutional: Positive for fatigue. Negative for chills, fever and unexpected weight change.  HENT: Negative for trouble swallowing and voice change.   Eyes: Positive for blurred vision. Negative for visual disturbance.  Respiratory: Negative for cough, shortness of breath and wheezing.   Cardiovascular: Negative for chest pain, palpitations and leg swelling.  Gastrointestinal: Negative for diarrhea, nausea and vomiting.  Endocrine: Negative for cold intolerance, heat intolerance,  polydipsia, polyphagia and polyuria.  Musculoskeletal: Negative for arthralgias and myalgias.  Skin: Negative for color change, pallor, rash and wound.  Neurological: Negative for seizures and headaches.  Psychiatric/Behavioral: Negative for confusion and suicidal ideas.    Objective:    BP (!) 151/84   Pulse 90   Ht '5\' 7"'  (1.702 m)   Wt 160 lb (72.6 kg)   BMI 25.06 kg/m   Wt Readings from Last 3 Encounters:  02/13/17 160 lb (72.6 kg)  02/13/17 160 lb (72.6 kg)  01/21/17 155 lb (70.3 kg)    Physical Exam  Constitutional: She is oriented to person, place, and time. She appears well-developed.  HENT:  Head: Normocephalic and atraumatic.  Eyes: EOM are normal.  Neck: Normal range of motion. Neck supple. No tracheal deviation present. No thyromegaly present.  Cardiovascular: Normal rate and regular rhythm.   Pulmonary/Chest: Effort normal and breath sounds normal.  Abdominal: Soft. Bowel sounds are normal. There is no tenderness. There is no guarding.  Musculoskeletal: Normal range of motion. She exhibits no edema.  Neurological: She is alert and oriented to person, place, and time. She has normal reflexes. No cranial nerve deficit. Coordination normal.  Skin: Skin is warm and dry. No rash noted. No erythema. No pallor.  Psychiatric: She has a normal mood and affect. Judgment normal.     CMP     Component Value Date/Time   NA 142 02/12/2017 1019   NA 128 (L) 11/13/2016 1425   K 3.6 02/12/2017 1019   CL 108 02/12/2017 1019   CO2 25 02/12/2017 1019   GLUCOSE 116 (H) 02/12/2017 1019   BUN 26 (H) 02/12/2017 1019   BUN 34 (H) 11/13/2016 1425   CREATININE 1.41 (H) 02/12/2017 1019   CALCIUM 8.8 02/12/2017 1019   PROT 6.2 07/17/2016 0842   ALBUMIN 3.7 02/12/2017 1019   ALBUMIN 3.6 07/17/2016 0842   AST 12 07/17/2016 0842   ALT 10 07/17/2016 0842   ALKPHOS 95 07/17/2016 0842   BILITOT 0.4 07/17/2016 0842   GFRNONAA 25 (L) 11/14/2016 1627   GFRAA 29 (L) 11/14/2016 1627      Diabetic Labs (most recent): Lab Results  Component Value Date   HGBA1C 8.5 (H) 02/12/2017   HGBA1C 14 10/31/2016   HGBA1C 9.9 (H) 02/10/2012     Lipid Panel ( most recent) Lipid Panel     Component Value Date/Time   CHOL 125 07/17/2016 0842   TRIG 75 07/17/2016 0842   HDL 53 07/17/2016 0842   CHOLHDL 2.4 07/17/2016 0842   CHOLHDL 3.3 02/10/2012 0520   VLDL 17 02/10/2012 0520   LDLCALC 57 07/17/2016 0842       Assessment & Plan:   1. Uncontrolled type 2 diabetes mellitus with diabetic nephropathy, with long-term current use of insulin ([place])  - Patient has currently uncontrolled symptomatic type 2 DM since  71 years of age. - She came with better average blood glucose of 152, And with improving A1c of 8.5% from greater than 14 %. Recent labs reviewed.   Her diabetes is complicated by coronary artery disease, chronic kidney disease, peripheral neuropathy, and patient remains at a high risk for more acute and chronic complications which include CAD, CVA, CKD, retinopathy, and neuropathy. These are all discussed in detail with the patient.  - I have counseled the patient on diet management  by adopting a carbohydrate restricted/protein rich diet.  -Suggestion is made for her to avoid simple carbohydrates  from her diet including Cakes, Sweet Desserts, Ice Cream, Soda (diet and regular), Sweet Tea, Candies, Chips, Cookies, Store Bought Juices, Alcohol in Excess of  1-2 drinks a day, Artificial Sweeteners, and "Sugar-free" Products. This will help patient to have stable blood glucose profile and potentially avoid unintended weight gain.   - I encouraged the patient to switch to  unprocessed or minimally processed complex starch and increased protein intake (animal or plant source), fruits, and vegetables.  - Patient is advised to stick to a routine mealtimes to eat 3 meals  a day and avoid unnecessary snacks ( to snack only to correct hypoglycemia).   - I have approached  patient with the following individualized plan to manage diabetes and patient agrees:   - Her A1c was improving to 8.5% from > 14%,  only on basal insulin for simplicity reasons.  She is too high risk to give different kinds of insulin, there is a  risk of insulin mix up.  - I will proceed only with her  basal insulin Lantus lowered to 50  units daily at bedtime, strict monitoring of blood glucose 2 times a day-before breakfast and at bedtime and she is requested to return in 6 weeks with repeat labs and her meter and logs.  -Patient is encouraged to call  clinic for blood glucose levels less than 70 or above 300 mg /dl. - She is not a suitable candidate for metformin, SGLT2 inhibitors, nor incretin therapy .  - Her exclusive choice of therapy is insulin which requires her commitment for proper monitoring for safe use. - Patient specific target  A1c;  LDL, HDL, Triglycerides, and  Waist Circumference were discussed in detail.  2) BP/HTN: Uncontrolled. Continue current medications including ACEI/ARB. 3) Lipids/HPL:   Control unknown.   Patient is advised to continue statins. 4)  Weight/Diet: CDE Consult will be initiated , exercise, and detailed carbohydrates information provided.  5) Chronic Care/Health Maintenance:  -Patient is on ACEI/ARB and Statin medications and encouraged to continue to follow up with Ophthalmology, nephrology, Podiatrist at least yearly or according to recommendations, and advised to   stay away from smoking. I have recommended yearly flu vaccine and pneumonia vaccination at least every 5 years;and  sleep for at least 7 hours a day.  - Time spent with the patient: 25 min, of which >50% was spent in reviewing her sugar logs , discussing her hypo- and hyper-glycemic episodes, reviewing her current and  previous labs and insulin doses and developing a plan to avoid hypo- and hyper-glycemia.   - Patient to bring meter and  blood glucose logs during her next visit.  - I  advised patient to maintain close follow up with Timmothy Euler, MD for primary care needs.  Follow up plan: - Return in about 3 months (around 05/16/2017) for meter, and logs.  Glade Lloyd, MD Phone: [phone]  Fax: [phone]  This note was partially dictated with voice recognition software. Similar sounding words can be transcribed inadequately or may not  be corrected upon review.  02/13/2017, 3:26 PM

## 2017-02-13 NOTE — Patient Instructions (Signed)
Advice for Weight Management -For most of [place] the best way to lose weight is by diet management. Generally speaking, diet management means restricting carbohydrate consumption to minimum possible (and to unprocessed or minimally processed complex starch) and increasing protein intake (animal or plant source), fruits, and vegetables.  -Sticking to a routine mealtime to eat 3 meals a day and avoiding unnecessary snacks is shown to have a big role in weight control.  -It is better to avoid simple carbohydrates including: Cakes, Sweet Desserts, Ice Cream, Soda (diet and regular), Sweet Tea, Candies, Chips, Cookies, Store Bought Juices, Alcohol in Excess of  1-2 drinks a day, Artificial Sweeteners, and "Sugar-free" Products.   -Exercise: 30 -60 minutes a day 3-4 days a week, or 150 minutes a week. Combine stretch, strength, and aerobic activities. You may seek evaluation by your heart doctor prior to initiating exercise if you have high risk for heart disease.

## 2017-02-13 NOTE — Progress Notes (Signed)
  Medical Nutrition Therapy:  Appt start time: 1500 end time:  3151.   Assessment:  Primary concerns today: Diabetes Type 2.  Eating more vegetables and grilling foods. A1C down from 14% to 8.5%.  She feels like she is moving more. Feels much better. Latnus 50 units a day;    Lab Results  Component Value Date   HGBA1C 8.5 (H) 02/12/2017     Preferred Learning Style:   No preference indicated   Learning Readiness:   Ready  Change in progress   MEDICATIONS:    DIETARY INTAKE:    24-hr recall:  B ( AM) 1 egg, 2 slice toast and fruit,  Snk ( AM):   L ( PM): Sandwich meat, fruit, water Snk ( PM):  D ( PM): Sliced [place],  Carrots,  Apples,  Toss salad,  Snk ( PM): water Beverages: Water  Usual physical activity: ADL   Estimated energy needs: 1200  calories 135 g carbohydrates 90 g protein 33 g fat  Progress Towards Goal(s):  In progress.   Nutritional Diagnosis:  NB-3.2 Limited access to food or water As related to  Diabetes.  As evidenced by A1C 14%.    Intervention:  Nutrition and Diabetes education provided on My Plate, CHO counting, meal planning, portion sizes, timing of meals, avoiding snacks between meals unless having a low blood sugar, target ranges for A1C and blood sugars, signs/symptoms and treatment of hyper/hypoglycemia, monitoring blood sugars, taking medications as prescribed, benefits of exercising 30 minutes per day and prevention of complications of DM.  Goals 1 Eat thee meals per day 2. Take 50 units of Lantus a day instead of 60 units. 3. Continue to increase fresh fruits and vegetables 4. Don't skip meals--be sure to eat breakfast by 9 am.  Teaching Method Utilized:  Visual Auditory Hands on  Handouts given during visit include:  The Plate Method  Meal Plan Card  Diabetes Instructions.   Barriers to learning/adherence to lifestyle change: none  Demonstrated degree of understanding via:  Teach Back   Monitoring/Evaluation:   Dietary intake, exercise, meal planning, SBG, and body weight in 3 month(s).

## 2017-03-13 DIAGNOSIS — M0510 Rheumatoid lung disease with rheumatoid arthritis of unspecified site: Secondary | ICD-10-CM | POA: Diagnosis not present

## 2017-03-13 DIAGNOSIS — Z13820 Encounter for screening for osteoporosis: Secondary | ICD-10-CM | POA: Diagnosis not present

## 2017-03-13 DIAGNOSIS — Z79899 Other long term (current) drug therapy: Secondary | ICD-10-CM | POA: Diagnosis not present

## 2017-03-13 DIAGNOSIS — M069 Rheumatoid arthritis, unspecified: Secondary | ICD-10-CM | POA: Diagnosis not present

## 2017-03-15 ENCOUNTER — Telehealth: Payer: Self-pay | Admitting: Family Medicine

## 2017-03-20 ENCOUNTER — Encounter: Payer: Self-pay | Admitting: Family Medicine

## 2017-03-20 ENCOUNTER — Ambulatory Visit (INDEPENDENT_AMBULATORY_CARE_PROVIDER_SITE_OTHER): Payer: Medicare Other | Admitting: Family Medicine

## 2017-03-20 VITALS — BP 130/77 | HR 94 | Temp 98.4°F | Ht 67.0 in | Wt 160.2 lb

## 2017-03-20 DIAGNOSIS — I10 Essential (primary) hypertension: Secondary | ICD-10-CM | POA: Diagnosis not present

## 2017-03-20 DIAGNOSIS — E782 Mixed hyperlipidemia: Secondary | ICD-10-CM | POA: Diagnosis not present

## 2017-03-20 DIAGNOSIS — Z23 Encounter for immunization: Secondary | ICD-10-CM | POA: Diagnosis not present

## 2017-03-20 DIAGNOSIS — M069 Rheumatoid arthritis, unspecified: Secondary | ICD-10-CM | POA: Diagnosis not present

## 2017-03-20 MED ORDER — ERGOCALCIFEROL 1.25 MG (50000 UT) PO CAPS: 50000.0000 [IU] | ORAL_CAPSULE | ORAL | 1 refills | Status: DC

## 2017-03-20 MED ORDER — ATORVASTATIN CALCIUM 10 MG PO TABS: 10.0000 mg | ORAL_TABLET | Freq: Every day | ORAL | 3 refills | Status: DC

## 2017-03-20 NOTE — Progress Notes (Signed)
   HPI  Patient presents today for follow up of medical conditions  RA Pt states that he has had a recent conversation with her rheumatologist and finally understands the gravity and damage to her bones that she is suffering. She is now been started on leflunomide and Enbrel, it's very expensive but her rheumatologist has arranged for a gradient of some sort to pay for it.  Hypertension Good medication compliance, no medication side effects to speak of.  Hyperlipidemia Labs are up-to-date, discussed labs due in January. Refill Lipitor  PMH: Smoking status noted ROS: Per HPI  Objective: BP 130/77   Pulse 94   Temp 98.4 F (36.9 C) (Oral)   Ht 5\' 7"  (1.702 m)   Wt 160 lb 3.2 oz (72.7 kg)   BMI 25.09 kg/m  Gen: NAD, alert, cooperative with exam HEENT: NCAT CV: RRR, good S1/S2, no murmur Resp: CTABL, no wheezes, non-labored Ext: No edema, warm Neuro: Alert and oriented, No gross deficits  Assessment and plan:  # Rheumatoid arthritis Patient is getting very good treatment from rheumatology, she's titrating down prednisone She has started leflunomide and enbrel   # Hyperlipidemia Labs up-to-date, refill Lipitor Tolerating medication well  # Hypertension Well-controlled, no changes Labs up-to-date    Meds ordered this encounter  Medications  . leflunomide (ARAVA) 10 MG tablet    Sig: Take 1 tablet by mouth daily.  Marland Kitchen etanercept (ENBREL) 50 MG/ML injection    Sig: Inject 50 mg into the skin once a week.  . ergocalciferol (VITAMIN D2) 50000 units capsule    Sig: Take 1 capsule (50,000 Units total) by mouth every 14 (fourteen) days.    Dispense:  6 capsule    Refill:  1  . atorvastatin (LIPITOR) 10 MG tablet    Sig: Take 1 tablet (10 mg total) by mouth daily.    Dispense:  90 tablet    Refill:  [place], MD [place] 03/20/2017, 11:03 AM

## 2017-03-20 NOTE — Patient Instructions (Signed)
Great to see you!  Come back in January to see me unless you need [place] sooner.

## 2017-03-25 ENCOUNTER — Other Ambulatory Visit: Payer: Self-pay | Admitting: Family Medicine

## 2017-03-25 MED ORDER — PANTOPRAZOLE SODIUM 40 MG PO TBEC: 40.0000 mg | DELAYED_RELEASE_TABLET | Freq: Every day | ORAL | 1 refills | Status: DC

## 2017-03-25 MED ORDER — ERGOCALCIFEROL 1.25 MG (50000 UT) PO CAPS: 50000.0000 [IU] | ORAL_CAPSULE | ORAL | 1 refills | Status: DC

## 2017-03-25 NOTE — Telephone Encounter (Signed)
Pt wanted Rxs changed from [facility] to [facility]

## 2017-04-02 ENCOUNTER — Other Ambulatory Visit: Payer: Self-pay

## 2017-04-02 MED ORDER — INSULIN GLARGINE 100 UNIT/ML SOLOSTAR PEN
50.0000 [IU] | PEN_INJECTOR | Freq: Every day | SUBCUTANEOUS | 2 refills | Status: DC
Start: 2017-04-02 — End: 2017-05-02

## 2017-04-15 ENCOUNTER — Ambulatory Visit: Payer: Medicare Other | Admitting: "Endocrinology

## 2017-04-15 ENCOUNTER — Ambulatory Visit: Payer: Medicare Other | Admitting: Nutrition

## 2017-04-15 DIAGNOSIS — E1165 Type 2 diabetes mellitus with hyperglycemia: Secondary | ICD-10-CM | POA: Diagnosis not present

## 2017-04-15 DIAGNOSIS — E1121 Type 2 diabetes mellitus with diabetic nephropathy: Secondary | ICD-10-CM | POA: Diagnosis not present

## 2017-04-15 DIAGNOSIS — Z794 Long term (current) use of insulin: Secondary | ICD-10-CM | POA: Diagnosis not present

## 2017-04-16 LAB — RENAL FUNCTION PANEL
Albumin: 3.7 g/dL (ref 3.6–5.1)
BUN / CREAT RATIO: 21 (calc) (ref 6–22)
BUN: 29 mg/dL — ABNORMAL HIGH (ref 7–25)
CALCIUM: 9.1 mg/dL (ref 8.6–10.4)
CHLORIDE: 105 mmol/L (ref 98–110)
CO2: 25 mmol/L (ref 20–32)
Creat: 1.38 mg/dL — ABNORMAL HIGH (ref 0.60–0.93)
Glucose, Bld: 199 mg/dL — ABNORMAL HIGH (ref 65–139)
POTASSIUM: 4.4 mmol/L (ref 3.5–5.3)
Phosphorus: 3.2 mg/dL (ref 2.1–4.3)
SODIUM: 140 mmol/L (ref 135–146)

## 2017-04-16 LAB — HEMOGLOBIN A1C
EAG (MMOL/L): 10 (calc)
HEMOGLOBIN A1C: 7.9 %{Hb} — AB (ref <5.7)
Mean Plasma Glucose: 180 (calc)

## 2017-04-25 ENCOUNTER — Other Ambulatory Visit: Payer: Self-pay | Admitting: Family Medicine

## 2017-05-02 ENCOUNTER — Ambulatory Visit (INDEPENDENT_AMBULATORY_CARE_PROVIDER_SITE_OTHER): Payer: Medicare Other | Admitting: Pediatrics

## 2017-05-02 ENCOUNTER — Encounter: Payer: Self-pay | Admitting: "Endocrinology

## 2017-05-02 ENCOUNTER — Encounter: Payer: Self-pay | Admitting: Pediatrics

## 2017-05-02 ENCOUNTER — Ambulatory Visit: Payer: Medicare Other | Admitting: Nutrition

## 2017-05-02 ENCOUNTER — Ambulatory Visit: Payer: Medicare Other | Admitting: "Endocrinology

## 2017-05-02 VITALS — BP 164/97 | HR 87 | Ht 67.0 in | Wt 166.0 lb

## 2017-05-02 VITALS — BP 158/81 | HR 95 | Temp 97.3°F | Ht 67.0 in | Wt 163.0 lb

## 2017-05-02 DIAGNOSIS — E1165 Type 2 diabetes mellitus with hyperglycemia: Secondary | ICD-10-CM | POA: Diagnosis not present

## 2017-05-02 DIAGNOSIS — E782 Mixed hyperlipidemia: Secondary | ICD-10-CM | POA: Diagnosis not present

## 2017-05-02 DIAGNOSIS — B379 Candidiasis, unspecified: Secondary | ICD-10-CM | POA: Diagnosis not present

## 2017-05-02 DIAGNOSIS — Z794 Long term (current) use of insulin: Secondary | ICD-10-CM | POA: Diagnosis not present

## 2017-05-02 DIAGNOSIS — I10 Essential (primary) hypertension: Secondary | ICD-10-CM | POA: Diagnosis not present

## 2017-05-02 DIAGNOSIS — R300 Dysuria: Secondary | ICD-10-CM | POA: Diagnosis not present

## 2017-05-02 DIAGNOSIS — E1121 Type 2 diabetes mellitus with diabetic nephropathy: Secondary | ICD-10-CM

## 2017-05-02 DIAGNOSIS — IMO0002 Reserved for concepts with insufficient information to code with codable children: Secondary | ICD-10-CM

## 2017-05-02 MED ORDER — INSULIN GLARGINE 100 UNIT/ML SOLOSTAR PEN: 50.0000 [IU] | PEN_INJECTOR | Freq: Every day | SUBCUTANEOUS | 2 refills | Status: DC

## 2017-05-02 MED ORDER — FLUCONAZOLE 150 MG PO TABS: 150.0000 mg | ORAL_TABLET | Freq: Once | ORAL | 0 refills | Status: AC

## 2017-05-02 NOTE — Progress Notes (Signed)
Subjective:    Patient ID: Erica Crosby, female    DOB: 1945/11/30. Patient is being seen in f/u for management of diabetes requested by  Timmothy Euler, MD  Past Medical History:  Diagnosis Date  . Arthritis   . Cataract   . Coronary artery disease    Mild plaque 2012  . Diabetes mellitus   . GERD (gastroesophageal reflux disease)   . Hyperlipidemia   . Hypertension   . Neuropathy   . Peptic ulcer   . Rheumatoid arthritis [facility])    Past Surgical History:  Procedure Laterality Date  . ABSCESS DRAINAGE    . CARPAL TUNNEL RELEASE Left   . CHOLECYSTECTOMY    . KNEE SURGERY Right   . NECK SURGERY     x 2  . SHOULDER SURGERY Left    Social History   Socioeconomic History  . Marital status: Widowed    Spouse name: None  . Number of children: 3  . Years of education: None  . Highest education level: None  Social Needs  . Financial resource strain: None  . Food insecurity - worry: None  . Food insecurity - inability: None  . Transportation needs - medical: None  . Transportation needs - non-medical: None  Occupational History  . None  Tobacco Use  . Smoking status: Former Smoker    Years: 25.00    Types: Cigarettes, E-cigarettes    Last attempt to quit: 02/02/2013    Years since quitting: 4.2  . Smokeless tobacco: Never Used  Substance and Sexual Activity  . Alcohol use: No  . Drug use: No  . Sexual activity: Not Currently    Birth control/protection: Post-menopausal  Other Topics Concern  . None  Social History Narrative   Daughter stays with her.    Outpatient Encounter Medications as of 05/02/2017  Medication Sig  . alendronate (FOSAMAX) 70 MG tablet Take 1 tablet by mouth once a week.  Marland Kitchen aspirin EC 81 MG tablet Take 81 mg by mouth daily.  Marland Kitchen atorvastatin (LIPITOR) 10 MG tablet Take 1 tablet (10 mg total) by mouth daily.  Marland Kitchen atorvastatin (LIPITOR) 10 MG tablet TAKE 1 TABLET BY MOUTH ONCE DAILY  . blood glucose meter kit and supplies KIT Dispense  based on patient and insurance preference. Use up to three (3) times daily as directed. (FOR insulin dependent diabetes - E11.79)  . Blood Glucose Monitoring Suppl (ACCU-CHEK AVIVA) device Use to check BG BID. Dx: E11.8, E11.65, Z79.4  . enalapril (VASOTEC) 20 MG tablet Take 2 tablets (40 mg total) by mouth daily.  . enalapril (VASOTEC) 20 MG tablet TAKE 2 TABLETS BY MOUTH ONCE DAILY  . ENBREL 50 MG/ML injection once a week.   . ergocalciferol (VITAMIN D2) 50000 units capsule Take 1 capsule (50,000 Units total) by mouth every 14 (fourteen) days.  Marland Kitchen etanercept (ENBREL) 50 MG/ML injection Inject 50 mg into the skin once a week.  . folic acid (FOLVITE) 1 MG tablet Take 1 tablet (1 mg total) by mouth daily.  Marland Kitchen gabapentin (NEURONTIN) 400 MG capsule Take 1 capsule (400 mg total) by mouth at bedtime.  Marland Kitchen glucose blood (ACCU-CHEK AVIVA) test strip Used to test blood glucose 2 times a day.  . Insulin Glargine (LANTUS SOLOSTAR) 100 UNIT/ML Solostar Pen Inject 50 Units at bedtime into the skin.  . Lancets (ACCU-CHEK MULTICLIX) lancets Use to check BG bid (Dx: E11.8, E11.65, Z79.4 - type 2 dm requiring insulin therapy)  . leflunomide (ARAVA)  10 MG tablet Take 1 tablet by mouth daily.  . pantoprazole (PROTONIX) 40 MG tablet Take 1 tablet (40 mg total) by mouth daily.  . predniSONE (DELTASONE) 5 MG tablet Take 5 mg by mouth daily.  . [DISCONTINUED] Insulin Glargine (LANTUS SOLOSTAR) 100 UNIT/ML Solostar Pen Inject 50 Units into the skin daily.   No facility-administered encounter medications on file as of 05/02/2017.    ALLERGIES: No Known Allergies VACCINATION STATUS: Immunization History  Administered Date(s) Administered  . Influenza,inj,Quad PF,6+ Mos 03/18/2016, 03/20/2017    Diabetes  She presents for her follow-up diabetic visit. She has type 2 diabetes mellitus. Onset time: She was diagnosed at approximate age of 51 years. Her disease course has been improving. There are no hypoglycemic  associated symptoms. Pertinent negatives for hypoglycemia include no confusion, headaches, pallor or seizures. Associated symptoms include blurred vision, fatigue, foot paresthesias and visual change. Pertinent negatives for diabetes include no chest pain, no polydipsia, no polyphagia and no polyuria. There are no hypoglycemic complications. Symptoms are improving. Diabetic complications include heart disease and nephropathy. Risk factors for coronary artery disease include dyslipidemia, diabetes mellitus, hypertension, tobacco exposure and sedentary lifestyle. Current diabetic treatment includes insulin injections. She is compliant with treatment none of the time. Her weight is increasing steadily. She is following a generally unhealthy diet. When asked about meal planning, she reported none. She has not had a previous visit with a dietitian. She never participates in exercise. There is no change in her home blood glucose trend. (She did not bring her meter nor logs with her today. Her recent A1c has improved to 7.9%.) An ACE inhibitor/angiotensin II receptor blocker is being taken. Eye exam is current.  Hyperlipidemia  This is a chronic problem. The current episode started more than 1 year ago. The problem is uncontrolled. Pertinent negatives include no chest pain, myalgias or shortness of breath. Current antihyperlipidemic treatment includes statins. Risk factors for coronary artery disease include dyslipidemia, diabetes mellitus, hypertension and a sedentary lifestyle.  Hypertension  This is a chronic problem. The current episode started more than 1 year ago. Associated symptoms include blurred vision. Pertinent negatives include no chest pain, headaches, palpitations or shortness of breath. Risk factors for coronary artery disease include dyslipidemia, diabetes mellitus, smoking/tobacco exposure and sedentary lifestyle. Past treatments include ACE inhibitors.    Review of Systems  Constitutional:  Positive for fatigue. Negative for chills, fever and unexpected weight change.  HENT: Negative for trouble swallowing and voice change.   Eyes: Positive for blurred vision. Negative for visual disturbance.  Respiratory: Negative for cough, shortness of breath and wheezing.   Cardiovascular: Negative for chest pain, palpitations and leg swelling.  Gastrointestinal: Negative for diarrhea, nausea and vomiting.  Endocrine: Negative for cold intolerance, heat intolerance, polydipsia, polyphagia and polyuria.  Musculoskeletal: Negative for arthralgias and myalgias.  Skin: Negative for color change, pallor, rash and wound.  Neurological: Negative for seizures and headaches.  Psychiatric/Behavioral: Negative for confusion and suicidal ideas.    Objective:    BP (!) 164/97   Pulse 87   Ht _0  (1.702 m)   Wt 166 lb (75.3 kg)   BMI 26.00 kg/m   Wt Readings from Last 3 Encounters:  05/02/17 166 lb (75.3 kg)  03/20/17 160 lb 3.2 oz (72.7 kg)  02/13/17 160 lb (72.6 kg)    Physical Exam  Constitutional: She is oriented to person, place, and time. She appears well-developed.  HENT:  Head: Normocephalic and atraumatic.  Eyes: EOM are normal.  Neck: Normal range of motion. Neck supple. No tracheal deviation present. No thyromegaly present.  Cardiovascular: Normal rate and regular rhythm.  Pulmonary/Chest: Effort normal and breath sounds normal.  Abdominal: Soft. Bowel sounds are normal. There is no tenderness. There is no guarding.  Musculoskeletal: Normal range of motion. She exhibits no edema.  Neurological: She is alert and oriented to person, place, and time. She has normal reflexes. No cranial nerve deficit. Coordination normal.  Skin: Skin is warm and dry. No rash noted. No erythema. No pallor.  Psychiatric: She has a normal mood and affect. Judgment normal.     CMP     Component Value Date/Time   NA 140 04/15/2017 1335   NA 128 (L) 11/13/2016 1425   K 4.4 04/15/2017 1335   CL  105 04/15/2017 1335   CO2 25 04/15/2017 1335   GLUCOSE 199 (H) 04/15/2017 1335   BUN 29 (H) 04/15/2017 1335   BUN 34 (H) 11/13/2016 1425   CREATININE 1.38 (H) 04/15/2017 1335   CALCIUM 9.1 04/15/2017 1335   PROT 6.2 07/17/2016 0842   ALBUMIN 3.7 02/12/2017 1019   ALBUMIN 3.6 07/17/2016 0842   AST 12 07/17/2016 0842   ALT 10 07/17/2016 0842   ALKPHOS 95 07/17/2016 0842   BILITOT 0.4 07/17/2016 0842   GFRNONAA 25 (L) 11/14/2016 1627   GFRAA 29 (L) 11/14/2016 1627     Diabetic Labs (most recent): Lab Results  Component Value Date   HGBA1C 7.9 (H) 04/15/2017   HGBA1C 8.5 (H) 02/12/2017   HGBA1C 14 10/31/2016     Lipid Panel ( most recent) Lipid Panel     Component Value Date/Time   CHOL 125 07/17/2016 0842   TRIG 75 07/17/2016 0842   HDL 53 07/17/2016 0842   CHOLHDL 2.4 07/17/2016 0842   CHOLHDL 3.3 02/10/2012 0520   VLDL 17 02/10/2012 0520   LDLCALC 57 07/17/2016 0842       Assessment & Plan:   1. Uncontrolled type 2 diabetes mellitus with diabetic nephropathy, with long-term current use of insulin ([place])  - Patient has currently uncontrolled symptomatic type 2 DM since  71 years of age. - She came with better average blood glucose of 152, And with improving A1c 7.9%, generally improving from greater than 14 %. Recent labs reviewed.   Her diabetes is complicated by coronary artery disease, chronic kidney disease, peripheral neuropathy, and patient remains at a high risk for more acute and chronic complications which include CAD, CVA, CKD, retinopathy, and neuropathy. These are all discussed in detail with the patient.  - I have counseled the patient on diet management  by adopting a carbohydrate restricted/protein rich diet.  -  Suggestion is made for her to avoid simple carbohydrates  from her diet including Cakes, Sweet Desserts / Pastries, Ice Cream, Soda (diet and regular), Sweet Tea, Candies, Chips, Cookies, Store Bought Juices, Alcohol in Excess of  1-2 drinks a  day, Artificial Sweeteners, and "Sugar-free" Products. This will help patient to have stable blood glucose profile and potentially avoid unintended weight gain.   - I encouraged the patient to switch to  unprocessed or minimally processed complex starch and increased protein intake (animal or plant source), fruits, and vegetables.  - Patient is advised to stick to a routine mealtimes to eat 3 meals  a day and avoid unnecessary snacks ( to snack only to correct hypoglycemia).   - I have approached patient with the following individualized plan to manage diabetes and patient agrees:   -  She is too high risk to give different kinds of insulin, there is a  risk of insulin mix up. - She remains on [place] last dose of prednisone 5 mg by mouth daily related to her rheumatoid arthritis. - I will proceed only with Lantus  50  units daily at bedtime, strict monitoring of blood glucose 2 times a day-before breakfast and at bedtime and she is requested to return in 6 weeks with repeat labs and her meter and logs.  -Patient is encouraged to call clinic for blood glucose levels less than 70 or above 300 mg /dl. - She is not a suitable candidate for metformin, SGLT2 inhibitors, nor incretin therapy .  - Her exclusive choice of therapy is insulin which requires her commitment for proper monitoring for safe use. - Patient specific target  A1c;  LDL, HDL, Triglycerides, and  Waist Circumference were discussed in detail.  2) BP/HTN: Uncontrolled. Continue current medications including ACEI/ARB. 3) Lipids/HPL:   Control unknown.   Patient is advised to continue statins. 4)  Weight/Diet: CDE Consult has been initiated , exercise, and detailed carbohydrates information provided.  5) Chronic Care/Health Maintenance:  -Patient is on ACEI/ARB and Statin medications and encouraged to continue to follow up with Ophthalmology, nephrology, Podiatrist at least yearly or according to recommendations, and advised to   stay  away from smoking. I have recommended yearly flu vaccine and pneumonia vaccination at least every 5 years;and  sleep for at least 7 hours a day.  - Time spent with the patient: 25 min, of which >50% was spent in reviewing her sugar logs , discussing her hypo- and hyper-glycemic episodes, reviewing her current and  previous labs and insulin doses and developing a plan to avoid hypo- and hyper-glycemia.   - I advised patient to maintain close follow up with Timmothy Euler, MD for primary care needs.  Follow up plan: - Return in about 3 months (around 08/02/2017) for meter, and logs.  Glade Lloyd, MD Phone: [phone]  Fax: [phone]  This note was partially dictated with voice recognition software. Similar sounding words can be transcribed inadequately or may not  be corrected upon review.  05/02/2017, 3:50 PM

## 2017-05-02 NOTE — Patient Instructions (Signed)

## 2017-05-02 NOTE — Progress Notes (Signed)
  Subjective:   Patient ID: Erica Crosby, female    DOB: 1946/05/08, 71 y.o.   MRN: [id number] CC: itching HPI: Erica Crosby is a 71 y.o. female presenting for itching  H/o RA, DM2. On prednisone, enbrel, insulin Has been drinking more regular sodas recently About two a week Follows with endocrine for diabetes Has had itching in vaginal area for past 2 days Has not had any genital rash Feels like a yeast infection No burning with urination No vaginal discharge  Relevant past medical, surgical, family and social history reviewed. Allergies and medications reviewed and updated. Social History   Tobacco Use  Smoking Status Former Smoker  . Years: 25.00  . Types: Cigarettes, E-cigarettes  . Last attempt to quit: 02/02/2013  . Years since quitting: 4.2  Smokeless Tobacco Never Used   ROS: Per HPI   Objective:    BP (!) 158/81   Pulse 95   Temp (!) 97.3 F (36.3 C) (Oral)   Ht 5\' 7"  (1.702 m)   Wt 163 lb (73.9 kg)   BMI 25.53 kg/m   Wt Readings from Last 3 Encounters:  05/02/17 163 lb (73.9 kg)  05/02/17 166 lb (75.3 kg)  03/20/17 160 lb 3.2 oz (72.7 kg)    Gen: NAD, alert, cooperative with exam, NCAT EYES: EOMI, no conjunctival injection, or no icterus ENT: OP without erythema CV: NRRR, normal S1/S2, no murmur Resp: CTABL, no wheezes, normal WOB Abd: +BS, soft, NTND. no guarding or organomegaly, no CVA tenderness Ext: No edema, warm Neuro: Alert and oriented  Assessment & Plan:  Erica Crosby was seen today for itching  Diagnoses and all orders for this visit:  Yeast infection No wet prep available after hours Given symptosm will treat with below x1 If not improving needs to rtc for exam and wet prep -     fluconazole (DIFLUCAN) 150 MG tablet; Take 1 tablet (150 mg total) once for 1 dose by mouth.  Dysuria -     Urinalysis, Complete   Follow up plan: No Follow-up on file. Assunta Found, MD [place]

## 2017-05-03 LAB — MICROSCOPIC EXAMINATION: RENAL EPITHEL UA: NONE SEEN /HPF

## 2017-05-03 LAB — URINALYSIS, COMPLETE
Bilirubin, UA: NEGATIVE
Glucose, UA: NEGATIVE
Ketones, UA: NEGATIVE
Leukocytes, UA: NEGATIVE
NITRITE UA: NEGATIVE
PH UA: 5.5 (ref 5.0–7.5)
Specific Gravity, UA: 1.015 (ref 1.005–1.030)
UUROB: 0.2 mg/dL (ref 0.2–1.0)

## 2017-05-04 LAB — URINE CULTURE

## 2017-06-21 ENCOUNTER — Telehealth: Payer: Self-pay | Admitting: Family Medicine

## 2017-06-21 MED ORDER — GABAPENTIN 400 MG PO CAPS
400.0000 mg | ORAL_CAPSULE | Freq: Every day | ORAL | 0 refills | Status: DC
Start: 2017-06-21 — End: 2017-10-04

## 2017-06-21 NOTE — Telephone Encounter (Signed)
Last visit with Dr. Wendi Snipes 03-20-17 and last A1C on 04-15-17  At 7.9.  Please advise on refill.

## 2017-06-21 NOTE — Telephone Encounter (Signed)
Rx for gabapentin sent.   Laroy Apple, MD [facility] 06/21/2017, 3:44 PM

## 2017-06-21 NOTE — Telephone Encounter (Signed)
What is the name of the medication? gabapentin (NEURONTIN) 400 MG capsule  Have you contacted your pharmacy to request a refill? no  Which pharmacy would you like this sent to? [facility]   Patient notified that their request is being sent to the clinical staff for review and that they should receive a call once it is complete. If they do not receive a call within 24 hours they can check with their pharmacy or our office.

## 2017-06-21 NOTE — Telephone Encounter (Signed)
Aware. 

## 2017-06-26 DIAGNOSIS — M0510 Rheumatoid lung disease with rheumatoid arthritis of unspecified site: Secondary | ICD-10-CM | POA: Diagnosis not present

## 2017-06-26 DIAGNOSIS — M069 Rheumatoid arthritis, unspecified: Secondary | ICD-10-CM | POA: Diagnosis not present

## 2017-06-26 DIAGNOSIS — E119 Type 2 diabetes mellitus without complications: Secondary | ICD-10-CM | POA: Diagnosis not present

## 2017-06-26 DIAGNOSIS — Z13820 Encounter for screening for osteoporosis: Secondary | ICD-10-CM | POA: Diagnosis not present

## 2017-06-26 DIAGNOSIS — Z79899 Other long term (current) drug therapy: Secondary | ICD-10-CM | POA: Diagnosis not present

## 2017-06-27 LAB — BASIC METABOLIC PANEL
BUN: 22 — AB (ref 4–21)
CREATININE: 1.5 — AB (ref <=1.1)

## 2017-07-31 DIAGNOSIS — N179 Acute kidney failure, unspecified: Secondary | ICD-10-CM | POA: Diagnosis not present

## 2017-07-31 DIAGNOSIS — I129 Hypertensive chronic kidney disease with stage 1 through stage 4 chronic kidney disease, or unspecified chronic kidney disease: Secondary | ICD-10-CM | POA: Diagnosis not present

## 2017-07-31 DIAGNOSIS — N390 Urinary tract infection, site not specified: Secondary | ICD-10-CM | POA: Diagnosis not present

## 2017-07-31 DIAGNOSIS — N183 Chronic kidney disease, stage 3 (moderate): Secondary | ICD-10-CM | POA: Diagnosis not present

## 2017-07-31 DIAGNOSIS — M069 Rheumatoid arthritis, unspecified: Secondary | ICD-10-CM | POA: Diagnosis not present

## 2017-07-31 DIAGNOSIS — E1129 Type 2 diabetes mellitus with other diabetic kidney complication: Secondary | ICD-10-CM | POA: Diagnosis not present

## 2017-08-05 ENCOUNTER — Encounter: Payer: Medicare Other | Attending: Family Medicine | Admitting: Nutrition

## 2017-08-05 ENCOUNTER — Encounter: Payer: Self-pay | Admitting: "Endocrinology

## 2017-08-05 ENCOUNTER — Ambulatory Visit: Payer: Medicare Other | Admitting: "Endocrinology

## 2017-08-05 ENCOUNTER — Encounter: Payer: Self-pay | Admitting: Nutrition

## 2017-08-05 VITALS — Ht 67.0 in | Wt 160.0 lb

## 2017-08-05 VITALS — BP 146/79 | HR 88 | Ht 67.0 in | Wt 160.0 lb

## 2017-08-05 DIAGNOSIS — E118 Type 2 diabetes mellitus with unspecified complications: Secondary | ICD-10-CM

## 2017-08-05 DIAGNOSIS — Z794 Long term (current) use of insulin: Secondary | ICD-10-CM | POA: Insufficient documentation

## 2017-08-05 DIAGNOSIS — E1165 Type 2 diabetes mellitus with hyperglycemia: Secondary | ICD-10-CM

## 2017-08-05 DIAGNOSIS — I10 Essential (primary) hypertension: Secondary | ICD-10-CM

## 2017-08-05 DIAGNOSIS — E782 Mixed hyperlipidemia: Secondary | ICD-10-CM | POA: Diagnosis not present

## 2017-08-05 DIAGNOSIS — E1121 Type 2 diabetes mellitus with diabetic nephropathy: Secondary | ICD-10-CM | POA: Diagnosis not present

## 2017-08-05 DIAGNOSIS — IMO0002 Reserved for concepts with insufficient information to code with codable children: Secondary | ICD-10-CM

## 2017-08-05 DIAGNOSIS — Z713 Dietary counseling and surveillance: Secondary | ICD-10-CM | POA: Insufficient documentation

## 2017-08-05 NOTE — Progress Notes (Signed)
Subjective:    Patient ID: Erica Crosby, female    DOB: 1945/12/17. Patient is being seen in f/u for management of diabetes requested by  Timmothy Euler, MD  Past Medical History:  Diagnosis Date  . Arthritis   . Cataract   . Coronary artery disease    Mild plaque 2012  . Diabetes mellitus   . GERD (gastroesophageal reflux disease)   . Hyperlipidemia   . Hypertension   . Neuropathy   . Peptic ulcer   . Rheumatoid arthritis [facility])    Past Surgical History:  Procedure Laterality Date  . ABSCESS DRAINAGE    . CARPAL TUNNEL RELEASE Left   . CHOLECYSTECTOMY    . KNEE SURGERY Right   . NECK SURGERY     x 2  . SHOULDER SURGERY Left    Social History   Socioeconomic History  . Marital status: Widowed    Spouse name: None  . Number of children: 3  . Years of education: None  . Highest education level: None  Social Needs  . Financial resource strain: None  . Food insecurity - worry: None  . Food insecurity - inability: None  . Transportation needs - medical: None  . Transportation needs - non-medical: None  Occupational History  . None  Tobacco Use  . Smoking status: Former Smoker    Years: 25.00    Types: Cigarettes, E-cigarettes    Last attempt to quit: 02/02/2013    Years since quitting: 4.5  . Smokeless tobacco: Never Used  Substance and Sexual Activity  . Alcohol use: No  . Drug use: No  . Sexual activity: Not Currently    Birth control/protection: Post-menopausal  Other Topics Concern  . None  Social History Narrative   Daughter stays with her.    Outpatient Encounter Medications as of 08/05/2017  Medication Sig  . alendronate (FOSAMAX) 70 MG tablet Take 1 tablet by mouth once a week.  Marland Kitchen aspirin EC 81 MG tablet Take 81 mg by mouth daily.  Marland Kitchen atorvastatin (LIPITOR) 10 MG tablet Take 1 tablet (10 mg total) by mouth daily.  . carvedilol (COREG) 12.5 MG tablet Take 12.5 mg by mouth 2 (two) times daily with a meal.  . enalapril (VASOTEC) 20 MG tablet  Take 40 mg by mouth daily.  . ergocalciferol (VITAMIN D2) 50000 units capsule Take 50,000 Units by mouth once a week.  . etanercept (ENBREL) 50 MG/ML injection Inject 50 mg into the skin once a week.  . folic acid (FOLVITE) 1 MG tablet Take 1 tablet (1 mg total) by mouth daily.  Marland Kitchen gabapentin (NEURONTIN) 400 MG capsule Take 1 capsule (400 mg total) by mouth at bedtime.  . Insulin Glargine (LANTUS SOLOSTAR) 100 UNIT/ML Solostar Pen Inject 50 Units at bedtime into the skin.  Marland Kitchen leflunomide (ARAVA) 10 MG tablet Take 1 tablet by mouth daily.  . Omega-3 Fatty Acids (FISH OIL) 1000 MG CAPS Take by mouth daily.  . pantoprazole (PROTONIX) 40 MG tablet Take 1 tablet (40 mg total) by mouth daily.  . predniSONE (DELTASONE) 5 MG tablet Take 5 mg by mouth daily.  Marland Kitchen glucose blood (ACCU-CHEK AVIVA) test strip Used to test blood glucose 2 times a day.  . Lancets (ACCU-CHEK MULTICLIX) lancets Use to check BG bid (Dx: E11.8, E11.65, Z79.4 - type 2 dm requiring insulin therapy)  . [DISCONTINUED] blood glucose meter kit and supplies KIT Dispense based on patient and insurance preference. Use up to three (3) times  daily as directed. (FOR insulin dependent diabetes - E11.79)  . [DISCONTINUED] Blood Glucose Monitoring Suppl (ACCU-CHEK AVIVA) device Use to check BG BID. Dx: E11.8, E11.65, Z79.4  . [DISCONTINUED] enalapril (VASOTEC) 20 MG tablet Take 2 tablets (40 mg total) by mouth daily.  . [DISCONTINUED] ENBREL 50 MG/ML injection once a week.   . [DISCONTINUED] ergocalciferol (VITAMIN D2) 50000 units capsule Take 1 capsule (50,000 Units total) by mouth every 14 (fourteen) days.   No facility-administered encounter medications on file as of 08/05/2017.    ALLERGIES: No Known Allergies VACCINATION STATUS: Immunization History  Administered Date(s) Administered  . Influenza,inj,Quad PF,6+ Mos 03/18/2016, 03/20/2017    Diabetes  She presents for her follow-up diabetic visit. She has type 2 diabetes mellitus. Onset  time: She was diagnosed at approximate age of 68 years. Her disease course has been improving. There are no hypoglycemic associated symptoms. Pertinent negatives for hypoglycemia include no confusion, headaches, pallor or seizures. Associated symptoms include blurred vision, fatigue, foot paresthesias and visual change. Pertinent negatives for diabetes include no chest pain, no polydipsia, no polyphagia and no polyuria. There are no hypoglycemic complications. Symptoms are improving. Diabetic complications include heart disease and nephropathy. Risk factors for coronary artery disease include dyslipidemia, diabetes mellitus, hypertension, tobacco exposure and sedentary lifestyle. Current diabetic treatment includes insulin injections. She is compliant with treatment none of the time. Her weight is stable. She is following a generally unhealthy diet. When asked about meal planning, she reported none. She has not had a previous visit with a dietitian. She never participates in exercise. There is no change in her home blood glucose trend. (She did  Bring the log showing rare and random blood glucose monitoring.  No major hypoglycemia.  She did not do her A1c before this visit.  Her A1c from October was 7.9%.  ) An ACE inhibitor/angiotensin II receptor blocker is being taken. Eye exam is current.  Hyperlipidemia  This is a chronic problem. The current episode started more than 1 year ago. The problem is uncontrolled. Pertinent negatives include no chest pain, myalgias or shortness of breath. Current antihyperlipidemic treatment includes statins. Risk factors for coronary artery disease include dyslipidemia, diabetes mellitus, hypertension and a sedentary lifestyle.  Hypertension  This is a chronic problem. The current episode started more than 1 year ago. Associated symptoms include blurred vision. Pertinent negatives include no chest pain, headaches, palpitations or shortness of breath. Risk factors for coronary  artery disease include dyslipidemia, diabetes mellitus, smoking/tobacco exposure and sedentary lifestyle. Past treatments include ACE inhibitors.    Review of Systems  Constitutional: Positive for fatigue. Negative for chills, fever and unexpected weight change.  HENT: Negative for trouble swallowing and voice change.   Eyes: Positive for blurred vision. Negative for visual disturbance.  Respiratory: Negative for cough, shortness of breath and wheezing.   Cardiovascular: Negative for chest pain, palpitations and leg swelling.  Gastrointestinal: Negative for diarrhea, nausea and vomiting.  Endocrine: Negative for cold intolerance, heat intolerance, polydipsia, polyphagia and polyuria.  Musculoskeletal: Negative for arthralgias and myalgias.  Skin: Negative for color change, pallor, rash and wound.  Neurological: Negative for seizures and headaches.  Psychiatric/Behavioral: Negative for confusion and suicidal ideas.    Objective:    BP (!) 146/79   Pulse 88   Ht '5\' 7"'  (1.702 m)   Wt 160 lb (72.6 kg)   BMI 25.06 kg/m   Wt Readings from Last 3 Encounters:  08/05/17 160 lb (72.6 kg)  08/05/17 160 lb (72.6 kg)  05/02/17 163 lb (73.9 kg)    Physical Exam  Constitutional: She is oriented to person, place, and time. She appears well-developed.  HENT:  Head: Normocephalic and atraumatic.  Eyes: EOM are normal.  Neck: Normal range of motion. Neck supple. No tracheal deviation present. No thyromegaly present.  Cardiovascular: Normal rate and regular rhythm.  Pulmonary/Chest: Effort normal and breath sounds normal.  Abdominal: Soft. Bowel sounds are normal. There is no tenderness. There is no guarding.  Musculoskeletal: Normal range of motion. She exhibits no edema.  Neurological: She is alert and oriented to person, place, and time. She has normal reflexes. No cranial nerve deficit. Coordination normal.  Skin: Skin is warm and dry. No rash noted. No erythema. No pallor.  Psychiatric:  She has a normal mood and affect. Judgment normal.     CMP     Component Value Date/Time   NA 140 04/15/2017 1335   NA 128 (L) 11/13/2016 1425   K 4.4 04/15/2017 1335   CL 105 04/15/2017 1335   CO2 25 04/15/2017 1335   GLUCOSE 199 (H) 04/15/2017 1335   BUN 29 (H) 04/15/2017 1335   BUN 34 (H) 11/13/2016 1425   CREATININE 1.38 (H) 04/15/2017 1335   CALCIUM 9.1 04/15/2017 1335   PROT 6.2 07/17/2016 0842   ALBUMIN 3.7 02/12/2017 1019   ALBUMIN 3.6 07/17/2016 0842   AST 12 07/17/2016 0842   ALT 10 07/17/2016 0842   ALKPHOS 95 07/17/2016 0842   BILITOT 0.4 07/17/2016 0842   GFRNONAA 25 (L) 11/14/2016 1627   GFRAA 29 (L) 11/14/2016 1627     Diabetic Labs (most recent): Lab Results  Component Value Date   HGBA1C 7.9 (H) 04/15/2017   HGBA1C 8.5 (H) 02/12/2017   HGBA1C 14 10/31/2016     Lipid Panel ( most recent) Lipid Panel     Component Value Date/Time   CHOL 125 07/17/2016 0842   TRIG 75 07/17/2016 0842   HDL 53 07/17/2016 0842   CHOLHDL 2.4 07/17/2016 0842   CHOLHDL 3.3 02/10/2012 0520   VLDL 17 02/10/2012 0520   LDLCALC 57 07/17/2016 0842       Assessment & Plan:   1. Uncontrolled type 2 diabetes mellitus with diabetic nephropathy, with long-term current use of insulin ([place])  - Patient has currently uncontrolled symptomatic type 2 DM since  72 years of age. - She did not do her A1c before this visit, her average blood glucose is 165  while monitoring rarely and randomly.    -Her A1c from October 2018 was 7.9%, generally improving from more than 14%.   Recent labs reviewed, showing worsening renal function.   Her diabetes is complicated by coronary artery disease, chronic kidney disease, peripheral neuropathy, and patient remains at a high risk for more acute and chronic complications which include CAD, CVA, CKD, retinopathy, and neuropathy. These are all discussed in detail with the patient.  - I have counseled the patient on diet management  by adopting a  carbohydrate restricted/protein rich diet.  -  Suggestion is made for her to avoid simple carbohydrates  from her diet including Cakes, Sweet Desserts / Pastries, Ice Cream, Soda (diet and regular), Sweet Tea, Candies, Chips, Cookies, Store Bought Juices, Alcohol in Excess of  1-2 drinks a day, Artificial Sweeteners, and "Sugar-free" Products. This will help patient to have stable blood glucose profile and potentially avoid unintended weight gain.  - I encouraged the patient to switch to  unprocessed or minimally processed complex starch and increased  protein intake (animal or plant source), fruits, and vegetables.  - Patient is advised to stick to a routine mealtimes to eat 3 meals  a day and avoid unnecessary snacks ( to snack only to correct hypoglycemia).   - I have approached patient with the following individualized plan to manage diabetes and patient agrees:   -  She is too high risk to give different kinds of insulin, there is a  risk of insulin mix up. - She remains on maintenance  dose of prednisone 5 mg by mouth daily related to her rheumatoid arthritis. - I will proceed only with Lantus  50  units daily at bedtime, strict monitoring of blood glucose 2 times a day-before breakfast and at bedtime .   -Patient is encouraged to call clinic for blood glucose levels less than 70 or above 300 mg /dl. - She is not a suitable candidate for metformin, SGLT2 inhibitors, nor incretin therapy .  - Her exclusive choice of therapy is insulin which requires her commitment for proper monitoring for safe use. - Patient specific target  A1c;  LDL, HDL, Triglycerides, and  Waist Circumference were discussed in detail.  2) BP/HTN: Her blood pressure is not controlled to target.   Continue current medications including ACEI/ARB. 3) Lipids/HPL:   Control unknown.   Patient is advised to continue statins. 4)  Weight/Diet: CDE Consult has been initiated , exercise, and detailed carbohydrates information  provided.  5) Chronic Care/Health Maintenance:  -Patient is on ACEI/ARB and Statin medications and encouraged to continue to follow up with Ophthalmology, nephrology, Podiatrist at least yearly or according to recommendations, and advised to   stay away from smoking. I have recommended yearly flu vaccine and pneumonia vaccination at least every 5 years;and  sleep for at least 7 hours a day.   - I advised patient to maintain close follow up with Timmothy Euler, MD for primary care needs.  - Time spent with the patient: 25 min, of which >50% was spent in reviewing her blood glucose logs , discussing her hypo- and hyper-glycemic episodes, reviewing her current and  previous labs and insulin doses and developing a plan to avoid hypo- and hyper-glycemia. Please refer to Patient Instructions for Blood Glucose Monitoring and Insulin/Medications Dosing Guide"  in media tab for additional information.  Follow up plan: - Return in about 3 months (around 11/02/2017) for follow up with pre-visit labs, meter, and logs.  Glade Lloyd, MD Phone: [phone]  Fax: [phone]  This note was partially dictated with voice recognition software. Similar sounding words can be transcribed inadequately or may not  be corrected upon review.  08/05/2017, 3:06 PM

## 2017-08-05 NOTE — Patient Instructions (Signed)
Goals Eat three balanced meals per day  Increase healthy starches of sweet potato, peas, corn, beans -1/2 c is a serving with meals. May have a piece of fruit at each meal. Keep drinking lemon water Dont' skip meals.

## 2017-08-05 NOTE — Progress Notes (Signed)
  Medical Nutrition Therapy:  Appt start time: 1500 end time:  6168.   Assessment:  Primary concerns today: Diabetes Type 2.  She is drinking more lemon water and cut out sodas. Still using Crystals light a little..  Eating more vegetables and grilling foods. He son helps her with meal preparation. Saw Dr. Dorris Fetch, Endocrinology today. She feels like she is moving more. Feels much better. Lantus  50 units a day.  She is walking with cane.  Needs better consistency of meals for improved blood sugar control. She wanted transportation assistance to make her medical appts. Contacted [facility] and left them a message to contact her  Lab Results  Component Value Date   HGBA1C 7.9 (H) 04/15/2017   Preferred Learning Style:   No preference indicated   Learning Readiness:   Ready  Change in progress   MEDICATIONS:    DIETARY INTAKE:    24-hr recall:  B ( AM) 1 egg, 2 slice toast and fruit,  Snk ( AM):   L ( PM): hasn't eaten lunch yet;  Yesterday: Baked chicken, toss salad,   Snk ( PM):  D ( PM): Sliced [place],  Carrots,  Apples,  Toss salad,  Snk ( PM): water Beverages: Water  Usual physical activity: ADL   Estimated energy needs: 1200  calories 135 g carbohydrates 90 g protein 33 g fat  Progress Towards Goal(s):  In progress.   Nutritional Diagnosis:  NB-3.2 Limited access to food or water As related to  Diabetes.  As evidenced by A1C 14%.    Intervention:  Nutrition and Diabetes education provided on My Plate, CHO counting, meal planning, portion sizes, timing of meals, avoiding snacks between meals unless having a low blood sugar, target ranges for A1C and blood sugars, signs/symptoms and treatment of hyper/hypoglycemia, monitoring blood sugars, taking medications as prescribed, benefits of exercising 30 minutes per day and prevention of complications of DM.   Goals Eat three balanced meals per day  Increase healthy starches of sweet potato, peas, corn, beans -1/2 c is a  serving with meals. May have a piece of fruit at each meal. Keep drinking lemon water Dont' skip meals.  Teaching Method Utilized:  Visual Auditory Hands on  Handouts given during visit include:  The Plate Method  Meal Plan Card  Diabetes Instructions.   Barriers to learning/adherence to lifestyle change: none  Demonstrated degree of understanding via:  Teach Back   Monitoring/Evaluation:  Dietary intake, exercise, meal planning, SBG, and body weight in 3 month(s).  She wanted transportation assistance to make her medical appts. Contacted [facility] and left them a message to contact her

## 2017-08-05 NOTE — Patient Instructions (Signed)

## 2017-08-06 ENCOUNTER — Other Ambulatory Visit: Payer: Self-pay | Admitting: Family Medicine

## 2017-08-14 ENCOUNTER — Ambulatory Visit (INDEPENDENT_AMBULATORY_CARE_PROVIDER_SITE_OTHER): Payer: Medicare Other | Admitting: *Deleted

## 2017-08-14 VITALS — BP 125/67 | HR 88 | Ht 64.0 in | Wt 160.0 lb

## 2017-08-14 DIAGNOSIS — Z23 Encounter for immunization: Secondary | ICD-10-CM

## 2017-08-14 DIAGNOSIS — Z0000 Encounter for general adult medical examination without abnormal findings: Secondary | ICD-10-CM | POA: Diagnosis not present

## 2017-08-14 NOTE — Patient Instructions (Addendum)
Erica Crosby , Thank you for taking time to come for your Medicare Wellness Visit. I appreciate your ongoing commitment to your health goals. Please review the following plan we discussed and let me know if I can assist you in the future.   These are the goals we discussed: Eat three meals a day  This is a list of the screening recommended for you and due dates:  Health Maintenance  Topic Date Due  .  Hepatitis C: One time screening is recommended by Center for Disease Control  (CDC) for  adults born from [birth year] through 1964.   [birth year]  . DEXA scan (bone density measurement)  04/12/2012  . Eye exam for diabetics  06/26/2017  . Tetanus Vaccine  08/14/2018*  . Complete foot exam   08/22/2017  . Hemoglobin A1C  10/14/2017  . Mammogram  01/31/2018  . Colon Cancer Screening  08/14/2026  . Flu Shot  Completed  . Pneumonia vaccines  Completed  *Topic was postponed. The date shown is not the original due date.     Your doctor has prescribed Cologuard, an easy-to-use, noninvasive test for colon cancer screening, based on the latest advances in stool DNA science.   Here's what will happen next:  1. You may receive a call or email from [facility] to confirm your mailing address and insurance information 2. Your kit will be shipped directly to you 3. You collet your stool sample in the privacy of your own home 4. You return the kit via [facility] shipping or pick-up, in the same box it arrived in 5. You doctor will contact you with the results once they are available  Screening for colon cancer is very important to your good health, so if you have any questions at all, please call [employer]'s Customer Support Specialists at [phone]. They are available 24 hours a day, 6 days a week.     Diabetes Mellitus and Nutrition When you have diabetes (diabetes mellitus), it is very important to have healthy eating habits because your blood sugar (glucose) levels are greatly  affected by what you eat and drink. Eating healthy foods in the appropriate amounts, at about the same times every day, can help you:  Control your blood glucose.  Lower your risk of heart disease.  Improve your blood pressure.  Reach or maintain a healthy weight.  Every person with diabetes is different, and each person has different needs for a meal plan. Your health care provider may recommend that you work with a diet and nutrition specialist (dietitian) to make a meal plan that is best for you. Your meal plan may vary depending on factors such as:  The calories you need.  The medicines you take.  Your weight.  Your blood glucose, blood pressure, and cholesterol levels.  Your activity level.  Other health conditions you have, such as heart or kidney disease.  How do carbohydrates affect me? Carbohydrates affect your blood glucose level more than any other type of food. Eating carbohydrates naturally increases the amount of glucose in your blood. Carbohydrate counting is a method for keeping track of how many carbohydrates you eat. Counting carbohydrates is important to keep your blood glucose at a healthy level, especially if you use insulin or take certain oral diabetes medicines. It is important to know how many carbohydrates you can safely have in each meal. This is different for every person. Your dietitian can help you calculate how many carbohydrates you should have at  each meal and for snack. Foods that contain carbohydrates include:  Bread, cereal, rice, pasta, and crackers.  Potatoes and corn.  Peas, beans, and lentils.  Milk and yogurt.  Fruit and juice.  Desserts, such as cakes, cookies, ice cream, and candy.  How does alcohol affect me? Alcohol can cause a sudden decrease in blood glucose (hypoglycemia), especially if you use insulin or take certain oral diabetes medicines. Hypoglycemia can be a life-threatening condition. Symptoms of hypoglycemia  (sleepiness, dizziness, and confusion) are similar to symptoms of having too much alcohol. If your health care provider says that alcohol is safe for you, follow these guidelines:  Limit alcohol intake to no more than 1 drink per day for nonpregnant women and 2 drinks per day for men. One drink equals 12 oz of beer, 5 oz of wine, or 1 oz of hard liquor.  Do not drink on an empty stomach.  Keep yourself hydrated with water, diet soda, or unsweetened iced tea.  Keep in mind that regular soda, juice, and other mixers may contain a lot of sugar and must be counted as carbohydrates.  What are tips for following this plan? Reading food labels  Start by checking the serving size on the label. The amount of calories, carbohydrates, fats, and other nutrients listed on the label are based on one serving of the food. Many foods contain more than one serving per package.  Check the total grams (g) of carbohydrates in one serving. You can calculate the number of servings of carbohydrates in one serving by dividing the total carbohydrates by 15. For example, if a food has 30 g of total carbohydrates, it would be equal to 2 servings of carbohydrates.  Check the number of grams (g) of saturated and trans fats in one serving. Choose foods that have low or no amount of these fats.  Check the number of milligrams (mg) of sodium in one serving. Most people should limit total sodium intake to less than 2,300 mg per day.  Always check the nutrition information of foods labeled as "low-fat" or "nonfat". These foods may be higher in added sugar or refined carbohydrates and should be avoided.  Talk to your dietitian to identify your daily goals for nutrients listed on the label. Shopping  Avoid buying canned, premade, or processed foods. These foods tend to be high in fat, sodium, and added sugar.  Shop around the outside edge of the grocery store. This includes fresh fruits and vegetables, bulk grains, fresh  meats, and fresh dairy. Cooking  Use low-heat cooking methods, such as baking, instead of high-heat cooking methods like deep frying.  Cook using healthy oils, such as olive, canola, or sunflower oil.  Avoid cooking with butter, cream, or high-fat meats. Meal planning  Eat meals and snacks regularly, preferably at the same times every day. Avoid going long periods of time without eating.  Eat foods high in fiber, such as fresh fruits, vegetables, beans, and whole grains. Talk to your dietitian about how many servings of carbohydrates you can eat at each meal.  Eat 4-6 ounces of lean protein each day, such as lean meat, chicken, fish, eggs, or tofu. 1 ounce is equal to 1 ounce of meat, chicken, or fish, 1 egg, or 1/4 cup of tofu.  Eat some foods each day that contain healthy fats, such as avocado, nuts, seeds, and fish. Lifestyle   Check your blood glucose regularly.  Exercise at least 30 minutes 5 or more days each week, or  as told by your health care provider.  Take medicines as told by your health care provider.  Do not use any products that contain nicotine or tobacco, such as cigarettes and e-cigarettes. If you need help quitting, ask your health care provider.  Work with a Social worker or diabetes educator to identify strategies to manage stress and any emotional and social challenges. What are some questions to ask my health care provider?  Do I need to meet with a diabetes educator?  Do I need to meet with a dietitian?  What number can I call if I have questions?  When are the best times to check my blood glucose? Where to find more information:  American Diabetes Association: diabetes.org/food-and-fitness/food  Academy of Nutrition and Dietetics: PokerClues.dk  [employer] of Diabetes and Digestive and Kidney Diseases (NIH):  ContactWire.be Summary  A healthy meal plan will help you control your blood glucose and maintain a healthy lifestyle.  Working with a diet and nutrition specialist (dietitian) can help you make a meal plan that is best for you.  Keep in mind that carbohydrates and alcohol have immediate effects on your blood glucose levels. It is important to count carbohydrates and to use alcohol carefully. This information is not intended to replace advice given to you by your health care provider. Make sure you discuss any questions you have with your health care provider. Document Released: 03/01/2005 Document Revised: 07/09/2016 Document Reviewed: 07/09/2016 Elsevier Interactive Patient Education  2018 [place].  Pneumococcal Vaccine, Polyvalent solution for injection What is this medicine? PNEUMOCOCCAL VACCINE, POLYVALENT (NEU mo KOK al vak SEEN, pol ee VEY luhnt) is a vaccine to prevent pneumococcus bacteria infection. These bacteria are a major cause of ear infections, Strep throat infections, and serious pneumonia, meningitis, or blood infections worldwide. These vaccines help the body to produce antibodies (protective substances) that help your body defend against these bacteria. This vaccine is recommended for people 52 years of age and older with health problems. It is also recommended for all adults over 49 years old. This vaccine will not treat an infection. This medicine may be used for other purposes; ask your health care provider or pharmacist if you have questions. COMMON BRAND NAME(S): Pneumovax 23 What should I tell my health care provider before I take this medicine? They need to know if you have any of these conditions: -bleeding problems -bone marrow or organ transplant -cancer, Hodgkin's disease -fever -infection -immune system problems -low platelet count in the blood -seizures -an unusual or allergic  reaction to pneumococcal vaccine, diphtheria toxoid, other vaccines, latex, other medicines, foods, dyes, or preservatives -pregnant or trying to get pregnant -breast-feeding How should I use this medicine? This vaccine is for injection into a muscle or under the skin. It is given by a health care professional. A copy of Vaccine Information Statements will be given before each vaccination. Read this sheet carefully each time. The sheet may change frequently. Talk to your pediatrician regarding the use of this medicine in children. While this drug may be prescribed for children as young as 51 years of age for selected conditions, precautions do apply. Overdosage: If you think you have taken too much of this medicine contact a poison control center or emergency room at once. NOTE: This medicine is only for you. Do not share this medicine with others. What if I miss a dose? It is important not to miss your dose. Call your doctor or health care professional if you are unable to keep  an appointment. What may interact with this medicine? -medicines for cancer chemotherapy -medicines that suppress your immune function -medicines that treat or prevent blood clots like warfarin, enoxaparin, and dalteparin -steroid medicines like prednisone or cortisone This list may not describe all possible interactions. Give your health care provider a list of all the medicines, herbs, non-prescription drugs, or dietary supplements you use. Also tell them if you smoke, drink alcohol, or use illegal drugs. Some items may interact with your medicine. What should I watch for while using this medicine? Mild fever and pain should go away in 3 days or less. Report any unusual symptoms to your doctor or health care professional. What side effects may I notice from receiving this medicine? Side effects that you should report to your doctor or health care professional as soon as possible: -allergic reactions like skin rash,  itching or hives, swelling of the face, lips, or tongue -breathing problems -confused -fever over 102 degrees F -pain, tingling, numbness in the hands or feet -seizures -unusual bleeding or bruising -unusual muscle weakness Side effects that usually do not require medical attention (report to your doctor or health care professional if they continue or are bothersome): -aches and pains -diarrhea -fever of 102 degrees F or less -headache -irritable -loss of appetite -pain, tender at site where injected -trouble sleeping This list may not describe all possible side effects. Call your doctor for medical advice about side effects. You may report side effects to FDA at 1-800-FDA-1088. Where should I keep my medicine? This does not apply. This vaccine is given in a clinic, pharmacy, doctor's office, or other health care setting and will not be stored at home. NOTE: This sheet is a summary. It may not cover all possible information. If you have questions about this medicine, talk to your doctor, pharmacist, or health care provider.  2018 Elsevier/Gold Standard (2008-01-09 14:32:37)

## 2017-08-14 NOTE — Progress Notes (Addendum)
Subjective:   Erica Crosby is a 72 y.o. female who presents for an Initial Medicare Annual Wellness Visit. Erica Crosby is accompanied today by her sister who helps to provide transportation. She lives in [place] and isn't always available. [employer] cost $3 each way and this isn't always affordable. Her daughter and son help her around the house and her son helps with meal preparation.   Review of Systems    Health is about the same as last year.   Cardiac Risk Factors include: sedentary lifestyle;diabetes mellitus;dyslipidemia;advanced age (>95men, >65 women);family history of premature cardiovascular disease  Other systems negative    Objective:    Today's Vitals   08/14/17 1425  BP: 125/67  Pulse: 88  Weight: 160 lb (72.6 kg)  Height: 5\' 4"  (1.626 m)   Body mass index is 27.46 kg/m.  Advanced Directives 08/14/2017 01/21/2017 11/14/2016 08/22/2016 02/10/2012  Does Patient Have a Medical Advance Directive? No No No No Patient does not have advance directive;Patient would not like information  Would patient like information on creating a medical advance directive? No - Patient declined - - Yes (MAU/Ambulatory/Procedural Areas - Information given) -    Current Medications (verified) Outpatient Encounter Medications as of 08/14/2017  Medication Sig  . alendronate (FOSAMAX) 70 MG tablet Take 1 tablet by mouth once a week.  Marland Kitchen aspirin EC 81 MG tablet Take 81 mg by mouth daily.  Marland Kitchen atorvastatin (LIPITOR) 10 MG tablet Take 1 tablet (10 mg total) by mouth daily.  . carvedilol (COREG) 12.5 MG tablet Take 12.5 mg by mouth 2 (two) times daily with a meal.  . enalapril (VASOTEC) 20 MG tablet Take 40 mg by mouth daily.  Marland Kitchen etanercept (ENBREL) 50 MG/ML injection Inject 50 mg into the skin once a week.  . gabapentin (NEURONTIN) 400 MG capsule Take 1 capsule (400 mg total) by mouth at bedtime.  Marland Kitchen glucose blood (ACCU-CHEK AVIVA) test strip Used to test blood glucose 2 times a day.  . Lancets (ACCU-CHEK  MULTICLIX) lancets Use to check BG bid (Dx: E11.8, E11.65, Z79.4 - type 2 dm requiring insulin therapy)  . leflunomide (ARAVA) 10 MG tablet Take 1 tablet by mouth daily.  . Omega-3 Fatty Acids (FISH OIL) 1000 MG CAPS Take by mouth daily.  . pantoprazole (PROTONIX) 40 MG tablet Take 1 tablet (40 mg total) by mouth daily.  . predniSONE (DELTASONE) 5 MG tablet Take 5 mg by mouth daily.  . Vitamin D, Ergocalciferol, (DRISDOL) 50000 units CAPS capsule TAKE 1 CAPSULE BY MOUTH EVERY 14 DAYS  . [DISCONTINUED] folic acid (FOLVITE) 1 MG tablet Take 1 tablet (1 mg total) by mouth daily.  . [DISCONTINUED] Insulin Glargine (LANTUS SOLOSTAR) 100 UNIT/ML Solostar Pen Inject 50 Units at bedtime into the skin.  Marland Kitchen ergocalciferol (VITAMIN D2) 50000 units capsule Take 50,000 Units by mouth once a week.   No facility-administered encounter medications on file as of 08/14/2017.     Allergies (verified) Patient has no known allergies.   History: Past Medical History:  Diagnosis Date  . Arthritis   . Cataract   . Coronary artery disease    Mild plaque 2012  . Diabetes mellitus   . GERD (gastroesophageal reflux disease)   . Hyperlipidemia   . Hypertension   . Neuropathy   . Peptic ulcer   . Rheumatoid arthritis [facility])    Past Surgical History:  Procedure Laterality Date  . ABSCESS DRAINAGE    . CARPAL TUNNEL RELEASE Left   .  CHOLECYSTECTOMY    . KNEE SURGERY Right   . NECK SURGERY     x 2  . SHOULDER SURGERY Left    Family History  Problem Relation Age of Onset  . Heart disease Sister        Congeital (died age 75) with "enlarged heart"  . CAD Sister   . Diabetes Mother   . Heart disease Mother        Later onset heart disease   . Dementia Mother   . Alcohol abuse Father   . Liver disease Father   . CAD Sister 23       CABG  . Early death Sister   . Diabetes Brother   . Kidney disease Brother        related to DM  . Diabetes Brother    Social History   Socioeconomic History  .  Marital status: Widowed    Spouse name: Not on file  . Number of children: 3  . Years of education: Not on file  . Highest education level: Not on file  Social Needs  . Financial resource strain: Not on file  . Food insecurity - worry: Not on file  . Food insecurity - inability: Not on file  . Transportation needs - medical: Not on file  . Transportation needs - non-medical: Not on file  Occupational History  . Not on file  Tobacco Use  . Smoking status: Former Smoker    Years: 25.00    Types: Cigarettes, E-cigarettes    Last attempt to quit: 02/02/2013    Years since quitting: 4.5  . Smokeless tobacco: Never Used  Substance and Sexual Activity  . Alcohol use: No  . Drug use: No  . Sexual activity: Not Currently    Birth control/protection: Post-menopausal  Other Topics Concern  . Not on file  Social History Narrative   Daughter stays with her.     Tobacco Counseling Counseling given: Not Answered   Clinical Intake:    How often do you need to have someone help you when you read instructions, pamphlets, or other written materials from your doctor or pharmacy?: 2 - Rarely What is the last grade level you completed in school?: 12  Interpreter Needed?: No  Information entered by :: Chong Sicilian, RN   Activities of Daily Living In your present state of health, do you have any difficulty performing the following activities: 08/14/2017  Hearing? N  Vision? N  Comment Eye exam last year. Needs to schedule yearly follow up. Dr Anthony Sar  Difficulty concentrating or making decisions? Y  Comment Has some difficulty with short term memory. For example, remembering names  Walking or climbing stairs? N  Dressing or bathing? Y  Comment Has some help into shower/tub  Doing errands, shopping? Y  Comment does not Physiological scientist and eating ? Y  Comment son prepares meals  Using the Toilet? N  Do you have problems with loss of bowel control? N  Managing your Medications?  Y  Comment Keeps up with her own medication. She does forget  at times. Does not use a pillbox. forgets to refill meds when she uses one  Managing your Finances? N  Housekeeping or managing your Housekeeping? Y  Comment daughter helps with this  Some recent data might be hidden     Immunizations and Health Maintenance Immunization History  Administered Date(s) Administered  . Influenza,inj,Quad PF,6+ Mos 03/18/2016, 03/20/2017  . Pneumococcal Conjugate-13 04/21/2016  . Pneumococcal  Polysaccharide-23 08/14/2017   Health Maintenance Due  Topic Date Due  . Hepatitis C Screening  September 29, 1945  . DEXA SCAN  04/12/2012  . OPHTHALMOLOGY EXAM  06/26/2017  . FOOT EXAM  08/22/2017    Patient Care Team: Timmothy Euler, MD as PCP - General (Family Medicine) Valinda Party, MD as Consulting Physician (Rheumatology) Cassandria Anger, MD as Consulting Physician (Endocrinology)  ED visit 11/14/16 for hyperglycemia. No other ED visits, surgeries, or hospitalizations.      Assessment:   This is a routine wellness examination for Erica Crosby.  Hearing/Vision screen No deficits noted during visit.   Dietary issues and exercise activities discussed: Current Exercise Habits: The patient does not participate in regular exercise at present, Exercise limited by: orthopedic condition(s);Other - see comments(balance)  Goals Plan meals 3 meals a day 150 minutes of moderate activity a week  Depression Screen PHQ 2/9 Scores 08/14/2017 08/05/2017 08/05/2017 05/02/2017 05/02/2017 03/20/2017 02/13/2017  PHQ - 2 Score 0 0 0 0 0 0 0  PHQ- 9 Score - - - - - - -    Fall Risk Fall Risk  08/14/2017 08/05/2017 08/05/2017 05/02/2017 05/02/2017  Falls in the past year? Yes No No No No  Number falls in past yr: 2 or more - - - -  Injury with Fall? No - - - -  Comment No fractures, bruised ribs - - - -  Risk Factor Category  High Fall Risk - - - -  Risk for fall due to : Impaired balance/gait;History of  fall(s);Other (Comment) - - - Impaired mobility;Medication side effect  Risk for fall due to: Comment low blood sugar - - - -  Follow up Falls prevention discussed - - - -    Cognitive Function: MMSE - Mini Mental State Exam 08/14/2017 08/22/2016  Not completed: (No Data) -  Orientation to time 5 4  Orientation to Place 5 5  Registration 3 3  Attention/ Calculation 0 0  Attention/Calculation-comments not attempted -  Recall 1 3  Language- name 2 objects 2 2  Language- repeat 1 1  Language- follow 3 step command 3 3  Language- read & follow direction 1 1  Write a sentence 1 1  Copy design 1 1  Total score 23 24  Abnormal exam. No major change from last year but needs f/u.      Screening Tests Health Maintenance  Topic Date Due  . Hepatitis C Screening  [birth year]  . DEXA SCAN  04/12/2012  . OPHTHALMOLOGY EXAM  06/26/2017  . FOOT EXAM  08/22/2017  . TETANUS/TDAP  08/14/2018 (Originally 04[birth year]1966)  . HEMOGLOBIN A1C  10/14/2017  . MAMMOGRAM  01/31/2018  . COLONOSCOPY  08/14/2026  . INFLUENZA VACCINE  Completed  . PNA vac Low Risk Adult  Completed       Plan:   Keep f/u with PCP. MMMSE needs to be reviewed. Offered to schedule appt with podiatrist for toenail trimming but patient will schedule on her own Cologuard Aveeno lotion with skin protectant  I have personally reviewed and noted the following in the patient's chart:   . Medical and social history . Use of alcohol, tobacco or illicit drugs  . Current medications and supplements . Functional ability and status . Nutritional status . Physical activity . Advanced directives . List of other physicians . Hospitalizations, surgeries, and ER visits in previous 12 months . Vitals . Screenings to include cognitive, depression, and falls . Referrals and appointments  In addition, I have  reviewed and discussed with patient certain preventive protocols, quality metrics, and best practice recommendations. A written  personalized care plan for preventive services as well as general preventive health recommendations were provided to patient.     Chong Sicilian, RN   08/14/2017   I have reviewed and agree with the above AWV documentation.   Note low MMSE  Laroy Apple, MD [facility] 09/04/2017, 2:44 PM

## 2017-08-16 ENCOUNTER — Other Ambulatory Visit: Payer: Self-pay | Admitting: Family Medicine

## 2017-08-23 ENCOUNTER — Other Ambulatory Visit: Payer: Self-pay | Admitting: "Endocrinology

## 2017-09-10 ENCOUNTER — Other Ambulatory Visit: Payer: Self-pay

## 2017-09-10 MED ORDER — INSULIN GLARGINE 100 UNIT/ML SOLOSTAR PEN: PEN_INJECTOR | SUBCUTANEOUS | 2 refills | Status: DC

## 2017-09-19 ENCOUNTER — Other Ambulatory Visit: Payer: Self-pay | Admitting: "Endocrinology

## 2017-10-04 ENCOUNTER — Other Ambulatory Visit: Payer: Self-pay | Admitting: Family Medicine

## 2017-10-07 NOTE — Telephone Encounter (Signed)
OV 05/02/17

## 2017-10-14 ENCOUNTER — Ambulatory Visit (INDEPENDENT_AMBULATORY_CARE_PROVIDER_SITE_OTHER): Payer: Medicare Other | Admitting: Family Medicine

## 2017-10-14 ENCOUNTER — Encounter: Payer: Self-pay | Admitting: Family Medicine

## 2017-10-14 VITALS — BP 151/82 | HR 78 | Temp 98.4°F | Ht 64.0 in | Wt 156.0 lb

## 2017-10-14 DIAGNOSIS — IMO0002 Reserved for concepts with insufficient information to code with codable children: Secondary | ICD-10-CM

## 2017-10-14 DIAGNOSIS — I10 Essential (primary) hypertension: Secondary | ICD-10-CM | POA: Diagnosis not present

## 2017-10-14 DIAGNOSIS — E1165 Type 2 diabetes mellitus with hyperglycemia: Secondary | ICD-10-CM | POA: Diagnosis not present

## 2017-10-14 DIAGNOSIS — E1121 Type 2 diabetes mellitus with diabetic nephropathy: Secondary | ICD-10-CM

## 2017-10-14 DIAGNOSIS — Z794 Long term (current) use of insulin: Secondary | ICD-10-CM

## 2017-10-14 LAB — BAYER DCA HB A1C WAIVED: HB A1C: 7.7 % — AB (ref <7.0)

## 2017-10-14 MED ORDER — CARVEDILOL 12.5 MG PO TABS: 6.2500 mg | ORAL_TABLET | Freq: Two times a day (BID) | ORAL | Status: DC

## 2017-10-14 MED ORDER — VITAMIN D (ERGOCALCIFEROL) 1.25 MG (50000 UNIT) PO CAPS: ORAL_CAPSULE | ORAL | 1 refills | Status: DC

## 2017-10-14 MED ORDER — INSULIN GLARGINE 100 UNIT/ML SOLOSTAR PEN: PEN_INJECTOR | SUBCUTANEOUS | 2 refills | Status: DC

## 2017-10-14 MED ORDER — FOLIC ACID 1 MG PO TABS: 1.0000 mg | ORAL_TABLET | Freq: Every day | ORAL | 1 refills | Status: DC

## 2017-10-14 MED ORDER — PANTOPRAZOLE SODIUM 40 MG PO TBEC: 40.0000 mg | DELAYED_RELEASE_TABLET | Freq: Every day | ORAL | 1 refills | Status: DC

## 2017-10-14 MED ORDER — ATORVASTATIN CALCIUM 10 MG PO TABS: 10.0000 mg | ORAL_TABLET | Freq: Every day | ORAL | 3 refills | Status: DC

## 2017-10-14 MED ORDER — ALENDRONATE SODIUM 70 MG PO TABS: 70.0000 mg | ORAL_TABLET | ORAL | 3 refills | Status: DC

## 2017-10-14 NOTE — Patient Instructions (Addendum)
Great to see you!  Come back in 4 months to see Dr. Lajuana Ripple.   Decrease your Lantus to 40 units a day, follow up with Dr. Dorris Fetch in 1 month as scheduled.   Decrease your Coreg to 1/2 tab twice daily.

## 2017-10-14 NOTE — Progress Notes (Signed)
   HPI  Patient presents today chronic medical conditions for follow-up.  Diabetes Patient reports good medication compliance Average fasting blood sugars between 100- 150  She states that she has had 10 or more episodes of hypoglycemia in the last 4 to 6 weeks, on one occasion it was measured at 40 and EMS was called, it was 58 this morning.  Hypertension Patient does not like to take Coreg, she states that he gets her chest discomfort, she has reduced her dose to 1 pill once a day. She has not taken it today.  She needs multiple refills today  PMH: Smoking status noted ROS: Per HPI  Objective: BP (!) 151/82   Pulse 78   Temp 98.4 F (36.9 C) (Oral)   Ht 5\' 4"  (1.626 m)   Wt 156 lb (70.8 kg)   BMI 26.78 kg/m  Gen: NAD, alert, cooperative with exam HEENT: NCAT CV: RRR, good S1/S2, no murmur Resp: CTABL, no wheezes, non-labored Ext: No edema, warm Neuro: Alert and oriented, No gross deficits  Assessment and plan:  # Type 2 diabetes Controlled, however significant hypoglycemia. Decrease Lantus to 40 units daily  Hypertension Elevated, however patient is not taking her beta-blocker. She does not seem to be tolerating it due to chest discomfort, decrease to 1/2 tablet twice daily.  I doubt that she is having cardiac related pain due to a blood pressure medication.      Orders Placed This Encounter  Procedures  . Bayer DCA Hb A1c Waived    Meds ordered this encounter  Medications  . alendronate (FOSAMAX) 70 MG tablet    Sig: Take 1 tablet (70 mg total) by mouth once a week.    Dispense:  90 tablet    Refill:  3  . atorvastatin (LIPITOR) 10 MG tablet    Sig: Take 1 tablet (10 mg total) by mouth daily.    Dispense:  90 tablet    Refill:  3  . Vitamin D, Ergocalciferol, (DRISDOL) 50000 units CAPS capsule    Sig: TAKE 1 CAPSULE BY MOUTH EVERY 14 DAYS    Dispense:  6 capsule    Refill:  1    Please consider 90 day supplies to promote better adherence  .  folic acid (FOLVITE) 1 MG tablet    Sig: Take 1 tablet (1 mg total) by mouth daily.    Dispense:  90 tablet    Refill:  1  . pantoprazole (PROTONIX) 40 MG tablet    Sig: Take 1 tablet (40 mg total) by mouth daily.    Dispense:  90 tablet    Refill:  1  . carvedilol (COREG) 12.5 MG tablet    Sig: Take 0.5 tablets (6.25 mg total) by mouth 2 (two) times daily with a meal.  . Insulin Glargine (LANTUS SOLOSTAR) 100 UNIT/ML Solostar Pen    Sig: INJECT 40 UNITS INTO THE SKIN AT BEDTIME    Dispense:  15 mL    Refill:  2    Please consider 90 day supplies to promote better adherence    Laroy Apple, MD [facility] 10/14/2017, 2:29 PM

## 2017-10-22 ENCOUNTER — Other Ambulatory Visit: Payer: Self-pay | Admitting: Family Medicine

## 2017-10-22 NOTE — Telephone Encounter (Signed)
Requesting leflunomide refill, I will encourage her to check with her rheumatologist for refill.  Laroy Apple, MD [facility] 10/22/2017, 3:39 PM

## 2017-10-23 NOTE — Telephone Encounter (Signed)
Left detailed message.   

## 2017-10-31 ENCOUNTER — Other Ambulatory Visit: Payer: Self-pay | Admitting: Family Medicine

## 2017-11-06 ENCOUNTER — Ambulatory Visit: Payer: Medicare Other | Admitting: Nutrition

## 2017-11-06 ENCOUNTER — Ambulatory Visit: Payer: Medicare Other | Admitting: "Endocrinology

## 2017-11-13 DIAGNOSIS — M069 Rheumatoid arthritis, unspecified: Secondary | ICD-10-CM | POA: Diagnosis not present

## 2017-11-13 DIAGNOSIS — M0510 Rheumatoid lung disease with rheumatoid arthritis of unspecified site: Secondary | ICD-10-CM | POA: Diagnosis not present

## 2017-11-13 DIAGNOSIS — Z13820 Encounter for screening for osteoporosis: Secondary | ICD-10-CM | POA: Diagnosis not present

## 2017-11-13 DIAGNOSIS — E119 Type 2 diabetes mellitus without complications: Secondary | ICD-10-CM | POA: Diagnosis not present

## 2017-11-13 DIAGNOSIS — Z79899 Other long term (current) drug therapy: Secondary | ICD-10-CM | POA: Diagnosis not present

## 2017-11-26 DIAGNOSIS — E1165 Type 2 diabetes mellitus with hyperglycemia: Secondary | ICD-10-CM | POA: Diagnosis not present

## 2017-11-26 DIAGNOSIS — E1121 Type 2 diabetes mellitus with diabetic nephropathy: Secondary | ICD-10-CM | POA: Diagnosis not present

## 2017-11-26 DIAGNOSIS — Z794 Long term (current) use of insulin: Secondary | ICD-10-CM | POA: Diagnosis not present

## 2017-11-27 LAB — COMPLETE METABOLIC PANEL WITH GFR
AG Ratio: 1.2 (calc) (ref 1.0–2.5)
ALKALINE PHOSPHATASE (APISO): 77 U/L (ref 33–130)
ALT: 7 U/L (ref 6–29)
AST: 12 U/L (ref 10–35)
Albumin: 3.7 g/dL (ref 3.6–5.1)
BILIRUBIN TOTAL: 0.5 mg/dL (ref 0.2–1.2)
BUN/Creatinine Ratio: 16 (calc) (ref 6–22)
BUN: 22 mg/dL (ref 7–25)
CHLORIDE: 105 mmol/L (ref 98–110)
CO2: 29 mmol/L (ref 20–32)
CREATININE: 1.41 mg/dL — AB (ref 0.60–0.93)
Calcium: 9.1 mg/dL (ref 8.6–10.4)
GFR, Est African American: 44 mL/min/{1.73_m2} — ABNORMAL LOW (ref >=60)
GFR, Est Non African American: 38 mL/min/{1.73_m2} — ABNORMAL LOW (ref >=60)
GLUCOSE: 220 mg/dL — AB (ref 65–139)
Globulin: 3.2 g/dL (calc) (ref 1.9–3.7)
Potassium: 4.2 mmol/L (ref 3.5–5.3)
SODIUM: 140 mmol/L (ref 135–146)
Total Protein: 6.9 g/dL (ref 6.1–8.1)

## 2017-11-27 LAB — HEMOGLOBIN A1C
EAG (MMOL/L): 9.2 (calc)
HEMOGLOBIN A1C: 7.4 %{Hb} — AB (ref <5.7)
MEAN PLASMA GLUCOSE: 166 (calc)

## 2017-11-28 ENCOUNTER — Encounter: Payer: Self-pay | Admitting: Nutrition

## 2017-11-28 ENCOUNTER — Ambulatory Visit (INDEPENDENT_AMBULATORY_CARE_PROVIDER_SITE_OTHER): Payer: Medicare Other | Admitting: "Endocrinology

## 2017-11-28 ENCOUNTER — Encounter: Payer: Self-pay | Admitting: "Endocrinology

## 2017-11-28 ENCOUNTER — Encounter: Payer: Medicare Other | Attending: Family Medicine | Admitting: Nutrition

## 2017-11-28 VITALS — Wt 156.0 lb

## 2017-11-28 VITALS — BP 141/84 | HR 90 | Ht 67.0 in | Wt 156.0 lb

## 2017-11-28 DIAGNOSIS — E782 Mixed hyperlipidemia: Secondary | ICD-10-CM | POA: Diagnosis not present

## 2017-11-28 DIAGNOSIS — Z794 Long term (current) use of insulin: Secondary | ICD-10-CM | POA: Diagnosis not present

## 2017-11-28 DIAGNOSIS — Z713 Dietary counseling and surveillance: Secondary | ICD-10-CM | POA: Diagnosis not present

## 2017-11-28 DIAGNOSIS — I10 Essential (primary) hypertension: Secondary | ICD-10-CM | POA: Diagnosis not present

## 2017-11-28 DIAGNOSIS — E118 Type 2 diabetes mellitus with unspecified complications: Secondary | ICD-10-CM

## 2017-11-28 DIAGNOSIS — E1165 Type 2 diabetes mellitus with hyperglycemia: Secondary | ICD-10-CM | POA: Insufficient documentation

## 2017-11-28 DIAGNOSIS — IMO0002 Reserved for concepts with insufficient information to code with codable children: Secondary | ICD-10-CM

## 2017-11-28 DIAGNOSIS — E1121 Type 2 diabetes mellitus with diabetic nephropathy: Secondary | ICD-10-CM | POA: Diagnosis not present

## 2017-11-28 MED ORDER — INSULIN GLARGINE 100 UNIT/ML SOLOSTAR PEN: PEN_INJECTOR | SUBCUTANEOUS | 2 refills | Status: DC

## 2017-11-28 NOTE — Progress Notes (Signed)
  Medical Nutrition Therapy:  Appt start time: 1500 0 end time:  6979.   Assessment:  Primary concerns today: Diabetes Type 2.    A1C down to 7.4% from 7.7%. Saw Dr. Dorris Fetch today and reduced insulin to 30 units of Lantus due to epidoses of low blood sugars in last few weeks. She notes she been eating more salads lately. Lost 4 lbs. Her daughter in law is in with her in waiting area. Hasn't eaten lunch yet today. Walks with cane.Drinking water.    Lab Results  Component Value Date   HGBA1C 7.4 (H) 11/26/2017   Preferred Learning Style:   No preference indicated   Learning Readiness:   Ready  Change in progress   MEDICATIONS:    DIETARY INTAKE:    24-hr recall:  B ( AM) Oatmeal and apple slices, egg, water Snk ( AM):   L ( PM):Salad with [place], dressing,water   Snk ( PM):  D ( PM) Toss salad, meat, water, fruit:  Snk ( PM): water Beverages: Water  Usual physical activity: ADL   Estimated energy needs: 1200  calories 135 g carbohydrates 90 g protein 33 g fat  Progress Towards Goal(s):  In progress.   Nutritional Diagnosis:  NB-3.2 Limited access to food or water As related to  Diabetes.  As evidenced by A1C 14%.    Intervention:  Nutrition and Diabetes education provided on My Plate, CHO counting, meal planning, portion sizes, timing of meals, avoiding snacks between meals unless having a low blood sugar, target ranges for A1C and blood sugars, signs/symptoms and treatment of hyper/hypoglycemia, monitoring blood sugars, taking medications as prescribed, benefits of exercising 30 minutes per day and prevention of complications of DM.   Keep up the good job! Eat 2-3 carb choices per meal to prevent low blood sugars; You can have a potatoe, corn, beans or starchy vegetables with lunch and dinner. Eat fruit with meals. Reduce insulin to 30 units a day. Prevent low blood sugars.  Teaching Method Utilized:  Visual Auditory Hands on  Handouts given during visit  include:  The Plate Method  Meal Plan Card  Diabetes Instructions.   Barriers to learning/adherence to lifestyle change: none  Demonstrated degree of understanding via:  Teach Back   Monitoring/Evaluation:  Dietary intake, exercise, meal planning, SBG, and body weight in 3 month(s).

## 2017-11-28 NOTE — Progress Notes (Signed)
Subjective:    Patient ID: Erica Crosby, female    DOB: August 03, 1945. Patient is being seen in f/u for management of diabetes requested by  Timmothy Euler, MD  Past Medical History:  Diagnosis Date  . Arthritis   . Cataract   . Coronary artery disease    Mild plaque 2012  . Diabetes mellitus   . GERD (gastroesophageal reflux disease)   . Hyperlipidemia   . Hypertension   . Neuropathy   . Peptic ulcer   . Rheumatoid arthritis [facility])    Past Surgical History:  Procedure Laterality Date  . ABSCESS DRAINAGE    . CARPAL TUNNEL RELEASE Left   . CHOLECYSTECTOMY    . KNEE SURGERY Right   . NECK SURGERY     x 2  . SHOULDER SURGERY Left    Social History   Socioeconomic History  . Marital status: Widowed    Spouse name: Not on file  . Number of children: 3  . Years of education: Not on file  . Highest education level: Not on file  Occupational History  . Not on file  Social Needs  . Financial resource strain: Not on file  . Food insecurity:    Worry: Not on file    Inability: Not on file  . Transportation needs:    Medical: Not on file    Non-medical: Not on file  Tobacco Use  . Smoking status: Former Smoker    Years: 25.00    Types: Cigarettes, E-cigarettes    Last attempt to quit: 02/02/2013    Years since quitting: 4.8  . Smokeless tobacco: Never Used  Substance and Sexual Activity  . Alcohol use: No  . Drug use: No  . Sexual activity: Not Currently    Birth control/protection: Post-menopausal  Lifestyle  . Physical activity:    Days per week: Not on file    Minutes per session: Not on file  . Stress: Not on file  Relationships  . Social connections:    Talks on phone: Not on file    Gets together: Not on file    Attends religious service: Not on file    Active member of club or organization: Not on file    Attends meetings of clubs or organizations: Not on file    Relationship status: Not on file  Other Topics Concern  . Not on file  Social  History Narrative   Daughter stays with her.    Outpatient Encounter Medications as of 11/28/2017  Medication Sig  . ACCU-CHEK AVIVA PLUS test strip USE 1 STRIP TO CHECK GLUCOSE TWICE DAILY  . alendronate (FOSAMAX) 70 MG tablet Take 1 tablet (70 mg total) by mouth once a week.  Marland Kitchen aspirin EC 81 MG tablet Take 81 mg by mouth daily.  Marland Kitchen atorvastatin (LIPITOR) 10 MG tablet Take 1 tablet (10 mg total) by mouth daily.  . carvedilol (COREG) 12.5 MG tablet Take 0.5 tablets (6.25 mg total) by mouth 2 (two) times daily with a meal.  . enalapril (VASOTEC) 20 MG tablet Take 40 mg by mouth daily.  . enalapril (VASOTEC) 20 MG tablet TAKE 2 TABLETS BY MOUTH ONCE DAILY  . etanercept (ENBREL) 50 MG/ML injection Inject 50 mg into the skin once a week.  . folic acid (FOLVITE) 1 MG tablet Take 1 tablet (1 mg total) by mouth daily.  Marland Kitchen gabapentin (NEURONTIN) 400 MG capsule TAKE 1 CAPSULE BY MOUTH AT BEDTIME  . glucose blood (ACCU-CHEK AVIVA)  test strip Used to test blood glucose 2 times a day.  . Insulin Glargine (LANTUS SOLOSTAR) 100 UNIT/ML Solostar Pen INJECT 30 UNITS INTO THE SKIN AT BEDTIME  . Lancets (ACCU-CHEK MULTICLIX) lancets Use to check BG bid (Dx: E11.8, E11.65, Z79.4 - type 2 dm requiring insulin therapy)  . leflunomide (ARAVA) 10 MG tablet Take 1 tablet by mouth daily.  . Omega-3 Fatty Acids (FISH OIL) 1000 MG CAPS Take by mouth daily.  . pantoprazole (PROTONIX) 40 MG tablet Take 1 tablet (40 mg total) by mouth daily.  . predniSONE (DELTASONE) 5 MG tablet Take 5 mg by mouth daily. Take 1/2 daily  . Vitamin D, Ergocalciferol, (DRISDOL) 50000 units CAPS capsule TAKE 1 CAPSULE BY MOUTH EVERY 14 DAYS  . [DISCONTINUED] Insulin Glargine (LANTUS SOLOSTAR) 100 UNIT/ML Solostar Pen INJECT 40 UNITS INTO THE SKIN AT BEDTIME   No facility-administered encounter medications on file as of 11/28/2017.    ALLERGIES: No Known Allergies VACCINATION STATUS: Immunization History  Administered Date(s) Administered   . Influenza,inj,Quad PF,6+ Mos 03/18/2016, 03/20/2017  . Pneumococcal Conjugate-13 04/21/2016  . Pneumococcal Polysaccharide-23 08/14/2017    Diabetes  She presents for her follow-up diabetic visit. She has type 2 diabetes mellitus. Onset time: She was diagnosed at approximate age of 45 years. Her disease course has been improving. There are no hypoglycemic associated symptoms. Pertinent negatives for hypoglycemia include no confusion, headaches, pallor or seizures. Associated symptoms include blurred vision, fatigue, foot paresthesias and visual change. Pertinent negatives for diabetes include no chest pain, no polydipsia, no polyphagia and no polyuria. There are no hypoglycemic complications. Symptoms are improving. Diabetic complications include heart disease and nephropathy. Risk factors for coronary artery disease include dyslipidemia, diabetes mellitus, hypertension, tobacco exposure and sedentary lifestyle. Current diabetic treatment includes insulin injections. She is compliant with treatment none of the time. Her weight is decreasing steadily. She is following a generally unhealthy diet. When asked about meal planning, she reported none. She has not had a previous visit with a dietitian. She never participates in exercise. Her home blood glucose trend is fluctuating minimally. Her overall blood glucose range is 140-180 mg/dl. (After her last visit, she did have a few episodes of hypoglycemia because of which her PMD lowered her Lantus to 40 units nightly.  Review of her logs showed she continued to have rare and random hypoglycemia even with the lowered dose of insulin.  Her previsit labs show A1c of 7.4% improvement from her prior A1c readings. ) An ACE inhibitor/angiotensin II receptor blocker is being taken. Eye exam is current.  Hyperlipidemia  This is a chronic problem. The current episode started more than 1 year ago. The problem is uncontrolled. Pertinent negatives include no chest pain,  myalgias or shortness of breath. Current antihyperlipidemic treatment includes statins. Risk factors for coronary artery disease include dyslipidemia, diabetes mellitus, hypertension and a sedentary lifestyle.  Hypertension  This is a chronic problem. The current episode started more than 1 year ago. Associated symptoms include blurred vision. Pertinent negatives include no chest pain, headaches, palpitations or shortness of breath. Risk factors for coronary artery disease include dyslipidemia, diabetes mellitus, smoking/tobacco exposure and sedentary lifestyle. Past treatments include ACE inhibitors.    Review of Systems  Constitutional: Positive for fatigue. Negative for chills, fever and unexpected weight change.  HENT: Negative for trouble swallowing and voice change.   Eyes: Positive for blurred vision. Negative for visual disturbance.  Respiratory: Negative for cough, shortness of breath and wheezing.   Cardiovascular: Negative for  chest pain, palpitations and leg swelling.  Gastrointestinal: Negative for diarrhea, nausea and vomiting.  Endocrine: Negative for cold intolerance, heat intolerance, polydipsia, polyphagia and polyuria.  Musculoskeletal: Negative for arthralgias and myalgias.  Skin: Negative for color change, pallor, rash and wound.  Neurological: Negative for seizures and headaches.  Psychiatric/Behavioral: Negative for confusion and suicidal ideas.    Objective:    BP (!) 141/84   Pulse 90   Ht 5\' 7"  (1.702 m)   Wt 156 lb (70.8 kg)   BMI 24.43 kg/m   Wt Readings from Last 3 Encounters:  11/28/17 156 lb (70.8 kg)  10/14/17 156 lb (70.8 kg)  08/14/17 160 lb (72.6 kg)    Physical Exam  Constitutional: She is oriented to person, place, and time. She appears well-developed.  HENT:  Head: Normocephalic and atraumatic.  Eyes: EOM are normal.  Neck: Normal range of motion. Neck supple. No tracheal deviation present. No thyromegaly present.  Cardiovascular: Normal  rate and regular rhythm.  Pulmonary/Chest: Effort normal and breath sounds normal.  Abdominal: Soft. Bowel sounds are normal. There is no tenderness. There is no guarding.  Musculoskeletal: Normal range of motion. She exhibits no edema.  Neurological: She is alert and oriented to person, place, and time. She has normal reflexes. No cranial nerve deficit. Coordination normal.  Skin: Skin is warm and dry. No rash noted. No erythema. No pallor.  Psychiatric: She has a normal mood and affect. Judgment normal.     CMP     Component Value Date/Time   NA 140 11/26/2017 1229   NA 128 (L) 11/13/2016 1425   K 4.2 11/26/2017 1229   CL 105 11/26/2017 1229   CO2 29 11/26/2017 1229   GLUCOSE 220 (H) 11/26/2017 1229   BUN 22 11/26/2017 1229   BUN 22 (A) 06/27/2017   CREATININE 1.41 (H) 11/26/2017 1229   CALCIUM 9.1 11/26/2017 1229   PROT 6.9 11/26/2017 1229   PROT 6.2 07/17/2016 0842   ALBUMIN 3.7 02/12/2017 1019   ALBUMIN 3.6 07/17/2016 0842   AST 12 11/26/2017 1229   ALT 7 11/26/2017 1229   ALKPHOS 95 07/17/2016 0842   BILITOT 0.5 11/26/2017 1229   BILITOT 0.4 07/17/2016 0842   GFRNONAA 38 (L) 11/26/2017 1229   GFRAA 44 (L) 11/26/2017 1229     Diabetic Labs (most recent): Lab Results  Component Value Date   HGBA1C 7.4 (H) 11/26/2017   HGBA1C 7.9 (H) 04/15/2017   HGBA1C 8.5 (H) 02/12/2017     Lipid Panel ( most recent) Lipid Panel     Component Value Date/Time   CHOL 125 07/17/2016 0842   TRIG 75 07/17/2016 0842   HDL 53 07/17/2016 0842   CHOLHDL 2.4 07/17/2016 0842   CHOLHDL 3.3 02/10/2012 0520   VLDL 17 02/10/2012 0520   LDLCALC 57 07/17/2016 0842       Assessment & Plan:   1. Uncontrolled type 2 diabetes mellitus with diabetic nephropathy, with long-term current use of insulin ([place])  - Patient has currently uncontrolled symptomatic type 2 DM since  72 years of age. -She returns with better A1c of 7.4%, overall improving from  >14%. -She recently had rare and  random hypoglycemic episodes which forced lowering her Lantus to 40 units nightly.  She admits to being inconsistent in her mealtimes and often is very late for breakfast. Recent labs reviewed, showing worsening renal function.   Her diabetes is complicated by coronary artery disease, chronic kidney disease, peripheral neuropathy, and patient remains at  a high risk for more acute and chronic complications which include CAD, CVA, CKD, retinopathy, and neuropathy. These are all discussed in detail with the patient.  - I have counseled the patient on diet management  by adopting a carbohydrate restricted/protein rich diet.  -  Suggestion is made for her to avoid simple carbohydrates  from her diet including Cakes, Sweet Desserts / Pastries, Ice Cream, Soda (diet and regular), Sweet Tea, Candies, Chips, Cookies, Store Bought Juices, Alcohol in Excess of  1-2 drinks a day, Artificial Sweeteners, and "Sugar-free" Products. This will help patient to have stable blood glucose profile and potentially avoid unintended weight gain.  - I encouraged the patient to switch to  unprocessed or minimally processed complex starch and increased protein intake (animal or plant source), fruits, and vegetables.  - Patient is advised to stick to a routine mealtimes to eat 3 meals  a day and avoid unnecessary snacks ( to snack only to correct hypoglycemia).   - I have approached patient with the following individualized plan to manage diabetes and patient agrees:   -  She is too high risk to give different kinds of insulin, there is a  risk of insulin mix up. - She remains on maintenance  dose of prednisone 5 mg by mouth daily related to her rheumatoid arthritis. - I will continue to lower her Lantus to 30  units daily at bedtime, strict monitoring of blood glucose 2 times a day-before breakfast and at bedtime . -She is strictly advised to wake up and eat breakfast around 8 AM to avoid morning hypoglycemia.  -Patient is  encouraged to call clinic for blood glucose levels less than 70 or above 300 mg /dl. - She is not a suitable candidate for metformin, SGLT2 inhibitors, nor incretin therapy .  - Her exclusive choice of therapy is insulin which requires her commitment for proper monitoring for safe use. - Patient specific target  A1c;  LDL, HDL, Triglycerides, and  Waist Circumference were discussed in detail.  2) BP/HTN: Her blood pressure is not controlled to target.  She is advised to continue her current medications including enalapril 20 mg p.o. daily.    3) Lipids/HPL: Her recent lipid panel showed controlled LDL at 57.  She is advised to continue atorvastatin 10 mg p.o. nightly.   4)  Weight/Diet: CDE Consult has been initiated , exercise, and detailed carbohydrates information provided.  5) Chronic Care/Health Maintenance:  -Patient is on ACEI/ARB and Statin medications and encouraged to continue to follow up with Ophthalmology, nephrology, Podiatrist at least yearly or according to recommendations, and advised to   stay away from smoking. I have recommended yearly flu vaccine and pneumonia vaccination at least every 5 years;and  sleep for at least 7 hours a day.   - I advised patient to maintain close follow up with Timmothy Euler, MD for primary care needs.  - Time spent with the patient: 25 min, of which >50% was spent in reviewing her blood glucose logs , discussing her hypo- and hyper-glycemic episodes, reviewing her current and  previous labs and insulin doses and developing a plan to avoid hypo- and hyper-glycemia. Please refer to Patient Instructions for Blood Glucose Monitoring and Insulin/Medications Dosing Guide"  in media tab for additional information. Donnajean Lopes Manus participated in the discussions, expressed understanding, and voiced agreement with the above plans.  All questions were answered to her satisfaction. she is encouraged to contact clinic should she have any questions or concerns  prior to her return visit.  Follow up plan: - Return in about 3 months (around 02/28/2018) for follow up with pre-visit labs, meter, and logs.  Glade Lloyd, MD Phone: [phone]  Fax: [phone]  This note was partially dictated with voice recognition software. Similar sounding words can be transcribed inadequately or may not  be corrected upon review.  11/28/2017, 3:10 PM

## 2017-11-28 NOTE — Patient Instructions (Addendum)
Keep up the good job! Eat 2-3 carb choices per meal to prevent low blood sugars; You can have a potatoe, corn, beans or starchy vegetables with lunch and dinner. Eat fruit with meals. Reduce insulin to 30 units a day. Prevent low blood sugars.

## 2017-12-05 DIAGNOSIS — N183 Chronic kidney disease, stage 3 (moderate): Secondary | ICD-10-CM | POA: Diagnosis not present

## 2017-12-05 DIAGNOSIS — I129 Hypertensive chronic kidney disease with stage 1 through stage 4 chronic kidney disease, or unspecified chronic kidney disease: Secondary | ICD-10-CM | POA: Diagnosis not present

## 2017-12-05 DIAGNOSIS — E1122 Type 2 diabetes mellitus with diabetic chronic kidney disease: Secondary | ICD-10-CM | POA: Diagnosis not present

## 2017-12-05 DIAGNOSIS — M069 Rheumatoid arthritis, unspecified: Secondary | ICD-10-CM | POA: Diagnosis not present

## 2017-12-05 DIAGNOSIS — N179 Acute kidney failure, unspecified: Secondary | ICD-10-CM | POA: Diagnosis not present

## 2017-12-11 ENCOUNTER — Telehealth: Payer: Self-pay | Admitting: *Deleted

## 2017-12-11 NOTE — Telephone Encounter (Signed)
VM from [place] practitioner with [facility] giving results quantitative flow test for PAD Left foot 0.96 Right foot 0.72 No symptoms, is on ASA

## 2017-12-12 NOTE — Telephone Encounter (Signed)
With ABI of 0.7 on R it would be reasonable to send her to vascular surgery if she is having leg pain. However our message says she is not.    ABI's by [facility] nurse:  Left foot 0.96 Right foot 0.72  Would ask her to follow up about this at her next OV.   Laroy Apple, MD [facility] 12/12/2017, 3:16 PM

## 2017-12-12 NOTE — Telephone Encounter (Signed)
lmtcb

## 2017-12-12 NOTE — Telephone Encounter (Signed)
Patient aware and verbalizes understanding. 

## 2017-12-18 ENCOUNTER — Other Ambulatory Visit: Payer: Self-pay | Admitting: "Endocrinology

## 2017-12-25 ENCOUNTER — Telehealth: Payer: Self-pay | Admitting: Family Medicine

## 2017-12-25 NOTE — Telephone Encounter (Signed)
Attempted to contact patient and nvm

## 2018-01-01 NOTE — Telephone Encounter (Signed)
NVM- no call back. This encounter will be closed.

## 2018-01-07 ENCOUNTER — Other Ambulatory Visit: Payer: Self-pay | Admitting: Family Medicine

## 2018-02-14 ENCOUNTER — Other Ambulatory Visit: Payer: Self-pay | Admitting: *Deleted

## 2018-02-14 NOTE — Telephone Encounter (Signed)
[id number]-cb 8/30

## 2018-02-19 DIAGNOSIS — E119 Type 2 diabetes mellitus without complications: Secondary | ICD-10-CM | POA: Diagnosis not present

## 2018-02-19 DIAGNOSIS — Z79899 Other long term (current) drug therapy: Secondary | ICD-10-CM | POA: Diagnosis not present

## 2018-02-19 DIAGNOSIS — M0510 Rheumatoid lung disease with rheumatoid arthritis of unspecified site: Secondary | ICD-10-CM | POA: Diagnosis not present

## 2018-02-19 DIAGNOSIS — M069 Rheumatoid arthritis, unspecified: Secondary | ICD-10-CM | POA: Diagnosis not present

## 2018-02-19 DIAGNOSIS — Z13820 Encounter for screening for osteoporosis: Secondary | ICD-10-CM | POA: Diagnosis not present

## 2018-02-23 ENCOUNTER — Encounter (HOSPITAL_COMMUNITY): Payer: Self-pay | Admitting: Emergency Medicine

## 2018-02-23 ENCOUNTER — Emergency Department (HOSPITAL_COMMUNITY)
Admission: EM | Admit: 2018-02-23 | Discharge: 2018-02-23 | Disposition: A | Discharge status: Home / Self Care | Payer: Medicare Other | Attending: Emergency Medicine | Admitting: Emergency Medicine

## 2018-02-23 ENCOUNTER — Emergency Department (HOSPITAL_COMMUNITY): Payer: Medicare Other

## 2018-02-23 DIAGNOSIS — R42 Dizziness and giddiness: Secondary | ICD-10-CM | POA: Diagnosis not present

## 2018-02-23 DIAGNOSIS — E119 Type 2 diabetes mellitus without complications: Secondary | ICD-10-CM | POA: Insufficient documentation

## 2018-02-23 DIAGNOSIS — Z79899 Other long term (current) drug therapy: Secondary | ICD-10-CM | POA: Insufficient documentation

## 2018-02-23 DIAGNOSIS — R404 Transient alteration of awareness: Secondary | ICD-10-CM | POA: Diagnosis not present

## 2018-02-23 DIAGNOSIS — Z794 Long term (current) use of insulin: Secondary | ICD-10-CM | POA: Insufficient documentation

## 2018-02-23 DIAGNOSIS — R55 Syncope and collapse: Secondary | ICD-10-CM | POA: Diagnosis not present

## 2018-02-23 DIAGNOSIS — Z87891 Personal history of nicotine dependence: Secondary | ICD-10-CM | POA: Insufficient documentation

## 2018-02-23 DIAGNOSIS — R079 Chest pain, unspecified: Secondary | ICD-10-CM | POA: Diagnosis not present

## 2018-02-23 DIAGNOSIS — Z7982 Long term (current) use of aspirin: Secondary | ICD-10-CM | POA: Diagnosis not present

## 2018-02-23 DIAGNOSIS — W19XXXA Unspecified fall, initial encounter: Secondary | ICD-10-CM | POA: Diagnosis not present

## 2018-02-23 DIAGNOSIS — I2510 Atherosclerotic heart disease of native coronary artery without angina pectoris: Secondary | ICD-10-CM | POA: Insufficient documentation

## 2018-02-23 DIAGNOSIS — I10 Essential (primary) hypertension: Secondary | ICD-10-CM | POA: Diagnosis not present

## 2018-02-23 DIAGNOSIS — S0990XA Unspecified injury of head, initial encounter: Secondary | ICD-10-CM | POA: Diagnosis not present

## 2018-02-23 DIAGNOSIS — I959 Hypotension, unspecified: Secondary | ICD-10-CM | POA: Diagnosis not present

## 2018-02-23 DIAGNOSIS — R0689 Other abnormalities of breathing: Secondary | ICD-10-CM | POA: Diagnosis not present

## 2018-02-23 LAB — CBC
HCT: 35.1 % — ABNORMAL LOW (ref 36.0–46.0)
HEMOGLOBIN: 11.2 g/dL — AB (ref 12.0–15.0)
MCH: 27 pg (ref 26.0–34.0)
MCHC: 31.9 g/dL (ref 30.0–36.0)
MCV: 84.6 fL (ref 78.0–100.0)
Platelets: 192 10*3/uL (ref 150–400)
RBC: 4.15 MIL/uL (ref 3.87–5.11)
RDW: 14.4 % (ref 11.5–15.5)
WBC: 7.6 10*3/uL (ref 4.0–10.5)

## 2018-02-23 LAB — URINALYSIS, ROUTINE W REFLEX MICROSCOPIC
BACTERIA UA: NONE SEEN
BILIRUBIN URINE: NEGATIVE
Glucose, UA: 50 mg/dL — AB
Ketones, ur: NEGATIVE mg/dL
Nitrite: NEGATIVE
PROTEIN: 100 mg/dL — AB
Specific Gravity, Urine: 1.013 (ref 1.005–1.030)
pH: 6 (ref 5.0–8.0)

## 2018-02-23 LAB — BASIC METABOLIC PANEL
ANION GAP: 6 (ref 5–15)
BUN: 18 mg/dL (ref 8–23)
CALCIUM: 8.4 mg/dL — AB (ref 8.9–10.3)
CO2: 26 mmol/L (ref 22–32)
Chloride: 107 mmol/L (ref 98–111)
Creatinine, Ser: 1.27 mg/dL — ABNORMAL HIGH (ref 0.44–1.00)
GFR calc non Af Amer: 42 mL/min — ABNORMAL LOW (ref >60)
GFR, EST AFRICAN AMERICAN: 48 mL/min — AB (ref >60)
Glucose, Bld: 258 mg/dL — ABNORMAL HIGH (ref 70–99)
Potassium: 3.7 mmol/L (ref 3.5–5.1)
Sodium: 139 mmol/L (ref 135–145)

## 2018-02-23 LAB — TROPONIN I: Troponin I: <0.03 ng/mL (ref <0.03)

## 2018-02-23 NOTE — ED Provider Notes (Signed)
[facility] EMERGENCY DEPARTMENT Provider Note   CSN: [id number] Arrival date & time: 02/23/18  1508     History   Chief Complaint Chief Complaint  Patient presents with  . Near Syncope    HPI Erica Crosby is a 72 y.o. female.  HPI Patient presents after near syncopal episode.  Was at [facility] with her daughter-in-law to pick up some medicines.  States she got there over lunch and the pharmacy is not open.  Had a stand around for a while which she states she has trouble with.  States she became lightheaded and almost passed out.  She is falling down but caught on the left arm by her daughter-in-law.  Feeling somewhat better now.  Initial CBG of 236 for EMS and blood pressures in the 80s.  Feels much better now.  States she has done this before and has had some difficulty standing up.  Some chest pain now on the left lower chest that she thinks is from being hit in that area with the fall.  No difficulty breathing.  No blood in stool or black stools.  States she did not eat very much yet today. Past Medical History:  Diagnosis Date  . Arthritis   . Cataract   . Coronary artery disease    Mild plaque 2012  . Diabetes mellitus   . GERD (gastroesophageal reflux disease)   . Hyperlipidemia   . Hypertension   . Neuropathy   . Peptic ulcer   . Rheumatoid arthritis [facility])     Patient Active Problem List   Diagnosis Date Noted  . RA (rheumatoid arthritis) ([place]) 03/20/2017  . Mixed hyperlipidemia 02/13/2017  . Essential hypertension, benign 12/24/2016  . Personal history of noncompliance with medical treatment, presenting hazards to health 12/24/2016  . Bulging lumbar disc 08/22/2016  . Essential hypertension 02/10/2012  . TIA (transient ischemic attack) 02/10/2012  . Ataxia 02/10/2012  . Uncontrolled type 2 diabetes mellitus with diabetic nephropathy, with long-term current use of insulin ([place]) 02/10/2012    Past Surgical History:  Procedure Laterality Date  . ABSCESS DRAINAGE     . CARPAL TUNNEL RELEASE Left   . CHOLECYSTECTOMY    . KNEE SURGERY Right   . NECK SURGERY     x 2  . SHOULDER SURGERY Left      OB History   None      Home Medications    Prior to Admission medications   Medication Sig Start Date End Date Taking? Authorizing Provider  alendronate (FOSAMAX) 70 MG tablet Take 1 tablet (70 mg total) by mouth once a week. 10/14/17  Yes Timmothy Euler, MD  aspirin EC 81 MG tablet Take 81 mg by mouth daily.   Yes [provider]  atorvastatin (LIPITOR) 10 MG tablet Take 1 tablet (10 mg total) by mouth daily. 10/14/17  Yes Timmothy Euler, MD  carvedilol (COREG) 12.5 MG tablet Take 0.5 tablets (6.25 mg total) by mouth 2 (two) times daily with a meal. Patient taking differently: Take 12.5 mg by mouth 2 (two) times daily with a meal.  10/14/17  Yes Timmothy Euler, MD  enalapril (VASOTEC) 20 MG tablet TAKE 2 TABLETS BY MOUTH ONCE DAILY 11/01/17  Yes Timmothy Euler, MD  etanercept (ENBREL) 50 MG/ML injection Inject 50 mg into the skin once a week.   Yes [provider]  folic acid (FOLVITE) 1 MG tablet Take 1 tablet (1 mg total) by mouth daily. 10/14/17  Yes Kenn File  L, MD  gabapentin (NEURONTIN) 400 MG capsule TAKE 1 CAPSULE BY MOUTH AT BEDTIME 01/08/18  Yes Timmothy Euler, MD  Insulin Glargine (LANTUS SOLOSTAR) 100 UNIT/ML Solostar Pen INJECT 30 UNITS INTO THE SKIN AT BEDTIME 11/28/17  Yes Nida, Marella Chimes, MD  leflunomide (ARAVA) 20 MG tablet Take 1 tablet by mouth daily. 02/19/18  Yes [provider]  Omega-3 Fatty Acids (FISH OIL) 1000 MG CAPS Take 1 capsule by mouth daily.    Yes [provider]  pantoprazole (PROTONIX) 40 MG tablet Take 1 tablet (40 mg total) by mouth daily. 10/14/17  Yes Timmothy Euler, MD  predniSONE (DELTASONE) 5 MG tablet Take 5 mg by mouth daily.  07/23/16  Yes [provider]  ACCU-CHEK AVIVA PLUS test strip USE 1 STRIP TO CHECK GLUCOSE TWICE DAILY 09/20/17    Cassandria Anger, MD  glucose blood (ACCU-CHEK AVIVA) test strip Used to test blood glucose 2 times a day. 01/23/17   Timmothy Euler, MD  Lancets (ACCU-CHEK MULTICLIX) lancets Use to check BG bid (Dx: E11.8, E11.65, Z79.4 - type 2 dm requiring insulin therapy) 01/22/17   Timmothy Euler, MD  Vitamin D, Ergocalciferol, (DRISDOL) 50000 units CAPS capsule TAKE 1 CAPSULE BY MOUTH EVERY 14 DAYS 10/14/17   Timmothy Euler, MD    Family History Family History  Problem Relation Age of Onset  . Heart disease Sister        Congeital (died age 64) with "enlarged heart"  . CAD Sister   . Diabetes Mother   . Heart disease Mother        Later onset heart disease   . Dementia Mother   . Alcohol abuse Father   . Liver disease Father   . CAD Sister 20       CABG  . Early death Sister   . Diabetes Brother   . Kidney disease Brother        related to DM  . Diabetes Brother     Social History Social History   Tobacco Use  . Smoking status: Former Smoker    Years: 25.00    Types: Cigarettes, E-cigarettes    Last attempt to quit: 02/02/2013    Years since quitting: 5.0  . Smokeless tobacco: Never Used  Substance Use Topics  . Alcohol use: No  . Drug use: No     Allergies   Patient has no known allergies.   Review of Systems Review of Systems  Constitutional: Negative for appetite change.  HENT: Negative for congestion.   Respiratory: Negative for shortness of breath.   Cardiovascular: Positive for chest pain.  Gastrointestinal: Negative for abdominal pain.  Genitourinary: Negative for flank pain.  Musculoskeletal: Negative for back pain.  Skin: Negative for rash.  Neurological: Positive for light-headedness.  Psychiatric/Behavioral: Negative for confusion.     Physical Exam Updated Vital Signs BP (!) 177/77   Pulse 77   Temp 98 F (36.7 C) (Oral)   Resp 15   Ht 5\' 7"  (1.702 m)   Wt 70.3 kg   SpO2 99%   BMI 24.28 kg/m   Physical Exam  Constitutional: She  appears well-developed.  HENT:  Head: Atraumatic.  Eyes: Pupils are equal, round, and reactive to light.  Neck: Neck supple.  Cardiovascular: Normal rate.  Pulmonary/Chest: Effort normal.  Tenderness to left lateral lower chest wall.  No crepitance deformity.  Abdominal: There is no tenderness.  Musculoskeletal: She exhibits no edema.  Neurological: She is alert.  Skin: Skin is warm. Capillary refill takes less than 2 seconds.     ED Treatments / Results  Labs (all labs ordered are listed, but only abnormal results are displayed) Labs Reviewed  BASIC METABOLIC PANEL - Abnormal; Notable for the following components:      Result Value   Glucose, Bld 258 (*)    Creatinine, Ser 1.27 (*)    Calcium 8.4 (*)    GFR calc non Af Amer 42 (*)    GFR calc Af Amer 48 (*)    All other components within normal limits  CBC - Abnormal; Notable for the following components:   Hemoglobin 11.2 (*)    HCT 35.1 (*)    All other components within normal limits  URINALYSIS, ROUTINE W REFLEX MICROSCOPIC - Abnormal; Notable for the following components:   APPearance CLOUDY (*)    Glucose, UA 50 (*)    Hgb urine dipstick SMALL (*)    Protein, ur 100 (*)    Leukocytes, UA SMALL (*)    WBC, UA >50 (*)    All other components within normal limits  TROPONIN I    EKG EKG Interpretation  Date/Time:  Sunday February 23 2018 15:18:39 EDT Ventricular Rate:  74 PR Interval:    QRS Duration: 93 QT Interval:  417 QTC Calculation: 463 R Axis:   90 Text Interpretation:  Sinus rhythm Borderline right axis deviation Probable anteroseptal infarct, old Minimal ST depression, diffuse leads No significant change since last tracing Confirmed by Davonna Belling [id number]) on 02/23/2018 3:44:11 PM   Radiology Dg Chest 2 View  Result Date: 02/23/2018 CLINICAL DATA:  Left lower chest pain following a fall. EXAM: CHEST - 2 VIEW COMPARISON:  11/14/2016. FINDINGS: Normal sized heart. Clear lungs. No fracture or  pneumothorax seen. Cervical spine fixation hardware and minimal thoracic spine degenerative changes. IMPRESSION: No acute abnormality. Electronically Signed   By: Claudie Revering M.D.   On: 02/23/2018 16:09    Procedures Procedures (including critical care time)  Medications Ordered in ED Medications - No data to display   Initial Impression / Assessment and Plan / ED Course  I have reviewed the triage vital signs and the nursing notes.  Pertinent labs & imaging results that were available during my care of the patient were reviewed by me and considered in my medical decision making (see chart for details).     Patient with near syncope.  Mild anemia.  Feels better.  Had episodes in the past where she states she cannot stand too long.  Work-up reassuring.  Discharge home and follow-up PCP.  Doubt cardiac cause or pulmonary embolism.  Final Clinical Impressions(s) / ED Diagnoses   Final diagnoses:  Near syncope    ED Discharge Orders    None       Davonna Belling, MD 02/23/18 [phone]

## 2018-02-23 NOTE — ED Triage Notes (Signed)
Pt was standing in line at [place] and had a syncopal episode.  Was caught by her daughter in law and did not injure self.  CBG 236 per ems with initial sbp of 80.  Given fluid bolus with improvement.  Pt denies symptoms right now.

## 2018-03-05 ENCOUNTER — Ambulatory Visit: Payer: Medicare Other | Admitting: Nutrition

## 2018-03-05 ENCOUNTER — Ambulatory Visit: Payer: Medicare Other | Admitting: "Endocrinology

## 2018-03-06 ENCOUNTER — Ambulatory Visit (INDEPENDENT_AMBULATORY_CARE_PROVIDER_SITE_OTHER): Payer: Medicare Other | Admitting: Family Medicine

## 2018-03-06 ENCOUNTER — Encounter: Payer: Self-pay | Admitting: Family Medicine

## 2018-03-06 VITALS — BP 130/76 | HR 88 | Temp 97.8°F | Ht 67.0 in | Wt 150.0 lb

## 2018-03-06 DIAGNOSIS — M0579 Rheumatoid arthritis with rheumatoid factor of multiple sites without organ or systems involvement: Secondary | ICD-10-CM

## 2018-03-06 DIAGNOSIS — R55 Syncope and collapse: Secondary | ICD-10-CM

## 2018-03-06 DIAGNOSIS — Z794 Long term (current) use of insulin: Secondary | ICD-10-CM

## 2018-03-06 DIAGNOSIS — E1121 Type 2 diabetes mellitus with diabetic nephropathy: Secondary | ICD-10-CM | POA: Diagnosis not present

## 2018-03-06 DIAGNOSIS — E1165 Type 2 diabetes mellitus with hyperglycemia: Secondary | ICD-10-CM

## 2018-03-06 DIAGNOSIS — E1122 Type 2 diabetes mellitus with diabetic chronic kidney disease: Secondary | ICD-10-CM

## 2018-03-06 DIAGNOSIS — I129 Hypertensive chronic kidney disease with stage 1 through stage 4 chronic kidney disease, or unspecified chronic kidney disease: Secondary | ICD-10-CM

## 2018-03-06 DIAGNOSIS — N183 Chronic kidney disease, stage 3 (moderate): Secondary | ICD-10-CM

## 2018-03-06 DIAGNOSIS — IMO0002 Reserved for concepts with insufficient information to code with codable children: Secondary | ICD-10-CM

## 2018-03-06 LAB — BAYER DCA HB A1C WAIVED: HB A1C: 9.8 % — AB (ref <7.0)

## 2018-03-06 NOTE — Patient Instructions (Addendum)
Your A1c was 9.8 today.  This is too high.  Make sure that you are taking your insulin as recommended.  Please schedule another follow up with Dr Dorris Fetch. You are overdue for your 3 month appointment with him.  In the meantime, I recommend that you use your Lantus 30 units every night as directed.    Do not skip insulin doses.  If your blood sugars are running too low or too high, you should contact either myself or Dr. Dorris Fetch as directed.  I have referred you back to Dr. Percival Spanish, your cardiologist since you had this passing out episode.  I think that we need to update your heart ultrasound.   Syncope Syncope is when you temporarily lose consciousness. Syncope may also be called fainting or passing out. It is caused by a sudden decrease in blood flow to the brain. Even though most causes of syncope are not dangerous, syncope can be a sign of a serious medical problem. Signs that you may be about to faint include:  Feeling dizzy or light-headed.  Feeling nauseous.  Seeing all white or all black in your field of vision.  Having cold, clammy skin.  If you fainted, get medical help right away.Call your local emergency services (911 in the [place] Do not drive yourself to the hospital. Follow these instructions at home: Pay attention to any changes in your symptoms. Take these actions to help with your condition:  Have someone stay with you until you feel stable.  Do not drive, use machinery, or play sports until your health care provider says it is okay.  Keep all follow-up visits as told by your health care provider. This is important.  If you start to feel like you might faint, lie down right away and raise (elevate) your feet above the level of your heart. Breathe deeply and steadily. Wait until all of the symptoms have passed.  Drink enough fluid to keep your urine clear or pale yellow.  If you are taking blood pressure or heart medicine, get up slowly and take several minutes to sit and  then stand. This can reduce dizziness.  Take over-the-counter and prescription medicines only as told by your health care provider.  Get help right away if:  You have a severe headache.  You have unusual pain in your chest, abdomen, or back.  You are bleeding from your mouth or rectum, or you have black or tarry stool.  You have a very fast or irregular heartbeat (palpitations).  You have pain with breathing.  You faint once or repeatedly.  You have a seizure.  You are confused.  You have trouble walking.  You have severe weakness.  You have vision problems. These symptoms may represent a serious problem that is an emergency. Do not wait to see if your symptoms will go away. Get medical help right away. Call your local emergency services (911 in the [place] Do not drive yourself to the hospital. This information is not intended to replace advice given to you by your health care provider. Make sure you discuss any questions you have with your health care provider. Document Released: 06/04/2005 Document Revised: 11/10/2015 Document Reviewed: 02/16/2015 Elsevier Interactive Patient Education  [employer].

## 2018-03-06 NOTE — Progress Notes (Signed)
Subjective: CC: ED follow up PCP: [name], DO HPI:Erica Crosby is a 72 y.o. female presenting to clinic today for:  1. ED follow up Patient was seen in the emergency department 02/23/2018 for near syncopal event.  It was determined to be unlikely cardiac etiology.  Blood sugar was noted to be in the 200s.  EKG was unrevealing.  Chest x-ray was unremarkable.  She denies any loss of consciousness since evaluation.  She states that she continues to have left-sided rib pain but this is relieved by Tylenol.  No shortness of breath or overt chest pain.  2. Type 2 Diabetes w. HTN/ HLD and rheumatoid arthritis Patient reports that she has not been compliant with Lantus 30 units nightly.  She takes prednisone for rheumatoid arthritis.  She sees Dr. Dossie Der for this.  She notes that she often does not check her blood sugars twice daily either because she gets too sleepy at suppertime and forgets.  She also notes that she does not like pricking her fingers and wants to know if there is something she can use that does not require finger pricking.  Reports recent LOC as above.  She notes polyuria, polydipsia.  Denies chest pain.  Last foot exam: Due Last A1c: 7.4 (11/2017) Nephropathy screen indicated?: on ACE-I Last flu, zoster and/or pneumovax: UTD  ROS: Per HPI  No Known Allergies Past Medical History:  Diagnosis Date  . Arthritis   . Cataract   . Coronary artery disease    Mild plaque 2012  . Diabetes mellitus   . Essential hypertension 02/10/2012  . GERD (gastroesophageal reflux disease)   . Hyperlipidemia   . Hypertension   . Neuropathy   . Peptic ulcer   . Rheumatoid arthritis ([place])   . TIA (transient ischemic attack) 02/10/2012    Current Outpatient Medications:  .  ACCU-CHEK AVIVA PLUS test strip, USE 1 STRIP TO CHECK GLUCOSE TWICE DAILY, Disp: 100 each, Rfl: 3 .  alendronate (FOSAMAX) 70 MG tablet, Take 1 tablet (70 mg total) by mouth once a week., Disp: 90 tablet, Rfl:  3 .  aspirin EC 81 MG tablet, Take 81 mg by mouth daily., Disp: , Rfl:  .  atorvastatin (LIPITOR) 10 MG tablet, Take 1 tablet (10 mg total) by mouth daily., Disp: 90 tablet, Rfl: 3 .  carvedilol (COREG) 12.5 MG tablet, Take 0.5 tablets (6.25 mg total) by mouth 2 (two) times daily with a meal. (Patient taking differently: Take 12.5 mg by mouth 2 (two) times daily with a meal. ), Disp: , Rfl:  .  enalapril (VASOTEC) 20 MG tablet, TAKE 2 TABLETS BY MOUTH ONCE DAILY, Disp: 180 tablet, Rfl: 1 .  etanercept (ENBREL) 50 MG/ML injection, Inject 50 mg into the skin once a week., Disp: , Rfl:  .  folic acid (FOLVITE) 1 MG tablet, Take 1 tablet (1 mg total) by mouth daily., Disp: 90 tablet, Rfl: 1 .  gabapentin (NEURONTIN) 400 MG capsule, TAKE 1 CAPSULE BY MOUTH AT BEDTIME, Disp: 30 capsule, Rfl: 0 .  glucose blood (ACCU-CHEK AVIVA) test strip, Used to test blood glucose 2 times a day., Disp: 100 each, Rfl: 3 .  Insulin Glargine (LANTUS SOLOSTAR) 100 UNIT/ML Solostar Pen, INJECT 30 UNITS INTO THE SKIN AT BEDTIME, Disp: 15 mL, Rfl: 2 .  Lancets (ACCU-CHEK MULTICLIX) lancets, Use to check BG bid (Dx: E11.8, E11.65, Z79.4 - type 2 dm requiring insulin therapy), Disp: 102 each, Rfl: 5 .  leflunomide (ARAVA) 20 MG tablet,  Take 1 tablet by mouth daily., Disp: , Rfl:  .  Omega-3 Fatty Acids (FISH OIL) 1000 MG CAPS, Take 1 capsule by mouth daily. , Disp: , Rfl:  .  pantoprazole (PROTONIX) 40 MG tablet, Take 1 tablet (40 mg total) by mouth daily., Disp: 90 tablet, Rfl: 1 .  predniSONE (DELTASONE) 5 MG tablet, Take 5 mg by mouth daily. , Disp: , Rfl:  .  Vitamin D, Ergocalciferol, (DRISDOL) 50000 units CAPS capsule, TAKE 1 CAPSULE BY MOUTH EVERY 14 DAYS, Disp: 6 capsule, Rfl: 1 Social History   Socioeconomic History  . Marital status: Widowed    Spouse name: Not on file  . Number of children: 3  . Years of education: Not on file  . Highest education level: Not on file  Occupational History  . Not on file   Social Needs  . Financial resource strain: Not on file  . Food insecurity:    Worry: Not on file    Inability: Not on file  . Transportation needs:    Medical: Not on file    Non-medical: Not on file  Tobacco Use  . Smoking status: Former Smoker    Years: 25.00    Types: Cigarettes, E-cigarettes    Last attempt to quit: 02/02/2013    Years since quitting: 5.0  . Smokeless tobacco: Never Used  Substance and Sexual Activity  . Alcohol use: No  . Drug use: No  . Sexual activity: Not Currently    Birth control/protection: Post-menopausal  Lifestyle  . Physical activity:    Days per week: Not on file    Minutes per session: Not on file  . Stress: Not on file  Relationships  . Social connections:    Talks on phone: Not on file    Gets together: Not on file    Attends religious service: Not on file    Active member of club or organization: Not on file    Attends meetings of clubs or organizations: Not on file    Relationship status: Not on file  . Intimate partner violence:    Fear of current or ex partner: Not on file    Emotionally abused: Not on file    Physically abused: Not on file    Forced sexual activity: Not on file  Other Topics Concern  . Not on file  Social History Narrative   Daughter stays with her.    Family History  Problem Relation Age of Onset  . Heart disease Sister        Congeital (died age 39) with "enlarged heart"  . CAD Sister   . Diabetes Mother   . Heart disease Mother        Later onset heart disease   . Dementia Mother   . Alcohol abuse Father   . Liver disease Father   . CAD Sister 72       CABG  . Early death Sister   . Diabetes Brother   . Kidney disease Brother        related to DM  . Diabetes Brother     Objective: Office vital signs reviewed. BP 130/76   Pulse 88   Temp 97.8 F (36.6 C) (Oral)   Ht 5\' 7"  (1.702 m)   Wt 150 lb (68 kg)   BMI 23.49 kg/m   Physical Examination:  General: Awake, alert, nontoxic, No acute  distress HEENT: Normal, MMM Cardio: regular rate and rhythm, S1S2 heard, no murmurs appreciated Pulm: clear to  auscultation bilaterally, no wheezes, rhonchi or rales; normal work of breathing on room air MSK: uses cane for ambulation  Assessment/ Plan: 72 y.o. female   1. Pre-syncope Thought to be secondary to dehydration.  Patient also has mild anemia.  She was previously seeing Dr. Percival Spanish for stress testing and evaluation of a murmur.  I referred her back to him for recheck since it has been over 2 years.  We discussed control of blood sugar and compliance with insulin.  2. Uncontrolled type 2 diabetes mellitus with diabetic nephropathy, with long-term current use of insulin ([place]) Diabetes is not at goal today.  I suspect this is related to noncompliance with insulin therapy.  Her A1c was 9.8.  I highly recommended that she resume use of Lantus 30 units nightly as directed by her endocrinologist.  I also highly recommended that she schedule follow-up visit with her endocrinologist as well as her dietitian.  Patient seems somewhat resistant to regular use of insulin and blood glucose monitoring. - Bayer DCA Hb A1c Waived  3. 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  No changes made.  4. Rheumatoid arthritis involving multiple sites with positive rheumatoid factor ([facility]) Currently treated with prednisone daily.  This likely also impacts her blood sugar.  She is overdue for her follow-up with her rheumatologist as well.   Orders Placed This Encounter  Procedures  . Bayer DCA Hb A1c Waived  . Ambulatory referral to Cardiology    Referral Priority:   Routine    Referral Type:   Consultation    Referral Reason:   Specialty Services Required    Requested Specialty:   Cardiology    Number of Visits Requested:   [place], [place] [phone]

## 2018-04-24 ENCOUNTER — Telehealth: Payer: Self-pay | Admitting: Family Medicine

## 2018-04-25 NOTE — Telephone Encounter (Signed)
I think this would be fine.  Please have her schedule an appointment with Dr Dorris Fetch, her endocrinologist, to have a discussion regarding this. She will need training on the [place].  She will have to make sure she is checking her BG with her normal monitor at least 3 times daily in order to qualify for the [place].

## 2018-04-30 NOTE — Telephone Encounter (Signed)
Patient aware of providers advice.

## 2018-05-05 NOTE — Progress Notes (Signed)
Cardiology Office Note   Date:  05/07/2018   ID:  Erica Crosby, DOB 20-Dec-1945, MRN [id number]  PCP:  [name], DO  Cardiologist:   Minus Breeding, MD   Chief Complaint  Patient presents with  . Palpitations      History of Present Illness: Erica Crosby is a 72 y.o. female who presents for evaluation of chest pain. She does have cardiovascular risk factors. I was able to find a catheterization report from 2012 at [facility].  She had LAD 30% stenosis at that time. This was done to evaluate chest discomfort.  When I saw her in 2017 she had chest pain .  There was no ischemia on Lexiscan Myoview. Echo demonstrated normal LV function.   The patient was seen in the ED in Sept with syncope.  This was thought not to be cardiac.  I reviewed these records for this visit.    She says she has passed out before but she is quite vague about this.  She said that this episode was dehydration.  She does not think she has had any further presyncope or syncope since then.  She says that her doctors keep telling her her heart is out of rhythm.  I cannot find documentation of this and I do not see any arrhythmias documented.  She says she feels her heart racing every night and she cannot fall asleep because of it.  She cannot quantify or qualify this.  She is very limited in her activities because of back pain.  She does some activities around the house like making herself something to eat.  She describes some vague chest discomfort and I tried to pull out the details of this.  It sounds like this is been chronic and was evaluated in the past with cath stress test as above.  Does not appear to have changed in character quality frequency or intensity since this evaluation.  Past Medical History:  Diagnosis Date  . Arthritis   . Cataract   . Coronary artery disease    Mild plaque 2012  . Diabetes mellitus   . Essential hypertension 02/10/2012  . GERD (gastroesophageal reflux disease)   .  Hyperlipidemia   . Hypertension   . Neuropathy   . Peptic ulcer   . Rheumatoid arthritis ([place])   . TIA (transient ischemic attack) 02/10/2012    Past Surgical History:  Procedure Laterality Date  . ABSCESS DRAINAGE    . CARPAL TUNNEL RELEASE Left   . CHOLECYSTECTOMY    . KNEE SURGERY Right   . NECK SURGERY     x 2  . SHOULDER SURGERY Left      Current Outpatient Medications  Medication Sig Dispense Refill  . ACCU-CHEK AVIVA PLUS test strip USE 1 STRIP TO CHECK GLUCOSE TWICE DAILY 100 each 3  . alendronate (FOSAMAX) 70 MG tablet Take 1 tablet (70 mg total) by mouth once a week. 90 tablet 3  . aspirin EC 81 MG tablet Take 81 mg by mouth daily.    Marland Kitchen atorvastatin (LIPITOR) 10 MG tablet Take 1 tablet (10 mg total) by mouth daily. 90 tablet 3  . carvedilol (COREG) 12.5 MG tablet Take 0.5 tablets (6.25 mg total) by mouth 2 (two) times daily with a meal. (Patient taking differently: Take 12.5 mg by mouth 2 (two) times daily with a meal. )    . enalapril (VASOTEC) 20 MG tablet TAKE 2 TABLETS BY MOUTH ONCE DAILY 180 tablet 1  .  folic acid (FOLVITE) 1 MG tablet Take 1 tablet (1 mg total) by mouth daily. 90 tablet 1  . gabapentin (NEURONTIN) 400 MG capsule TAKE 1 CAPSULE BY MOUTH AT BEDTIME 30 capsule 0  . glucose blood (ACCU-CHEK AVIVA) test strip Used to test blood glucose 2 times a day. 100 each 3  . Insulin Glargine (LANTUS SOLOSTAR) 100 UNIT/ML Solostar Pen INJECT 30 UNITS INTO THE SKIN AT BEDTIME 15 mL 2  . Lancets (ACCU-CHEK MULTICLIX) lancets Use to check BG bid (Dx: E11.8, E11.65, Z79.4 - type 2 dm requiring insulin therapy) 102 each 5  . leflunomide (ARAVA) 20 MG tablet Take 1 tablet by mouth daily.    . Omega-3 Fatty Acids (FISH OIL) 1000 MG CAPS Take 1 capsule by mouth daily.     . pantoprazole (PROTONIX) 40 MG tablet Take 1 tablet (40 mg total) by mouth daily. 90 tablet 1  . predniSONE (DELTASONE) 5 MG tablet Take 2.5 mg by mouth daily.     . Vitamin D, Ergocalciferol, (DRISDOL)  50000 units CAPS capsule TAKE 1 CAPSULE BY MOUTH EVERY 14 DAYS 6 capsule 1   No current facility-administered medications for this visit.     Allergies:   Patient has no known allergies.    ROS:  Please see the history of present illness.   Otherwise, review of systems are positive for diffuse arthritis, insomnia, neuropathy, back pain, constipation.   All other systems are reviewed and negative.    PHYSICAL EXAM: VS:  BP 117/68   Pulse 92   Ht 5\' 7"  (1.702 m)   Wt 147 lb 6.4 oz (66.9 kg)   SpO2 96%   BMI 23.09 kg/m  , BMI Body mass index is 23.09 kg/m. GENERAL:  Well appearing NECK:  No jugular venous distention, waveform within normal limits, carotid upstroke brisk and symmetric, no bruits, no thyromegaly LUNGS:  Clear to auscultation bilaterally CHEST:  Unremarkable HEART:  PMI not displaced or sustained,S1 and S2 within normal limits, no S3, no S4, no clicks, no rubs, no murmurs ABD:  Flat, positive bowel sounds normal in frequency in pitch, no bruits, no rebound, no guarding, no midline pulsatile mass, no hepatomegaly, no splenomegaly EXT:  2 plus pulses throughout, no edema, no cyanosis no clubbing  EKG:  EKG is not ordered today. The ekg ordered 9/8/19demonstrates sinus rhythm, rate 74, axis within normal limits, intervals within normal limits, no acute ST-T wave changes.  There are some nonspecific inferior ST changes.   Recent Labs: 11/26/2017: ALT 7 02/23/2018: BUN 18; Creatinine, Ser 1.27; Hemoglobin 11.2; Platelets 192; Potassium 3.7; Sodium 139    Lipid Panel    Component Value Date/Time   CHOL 125 07/17/2016 0842   TRIG 75 07/17/2016 0842   HDL 53 07/17/2016 0842   CHOLHDL 2.4 07/17/2016 0842   CHOLHDL 3.3 02/10/2012 0520   VLDL 17 02/10/2012 0520   LDLCALC 57 07/17/2016 0842      Wt Readings from Last 3 Encounters:  05/07/18 147 lb 6.4 oz (66.9 kg)  03/06/18 150 lb (68 kg)  02/23/18 155 lb (70.3 kg)      Other studies Reviewed: Additional studies/  records that were reviewed today include:  ED records Review of the above records demonstrates:  See elsewhere   ASSESSMENT AND PLAN:  PALPITATIONS:   She has some vague tachypalpitations.  I will check a TSH and apply a 48-hour Holter.  If these are unremarkable I will not suggest further cardiac work-up.  CHEST PAIN:  Her pain has been very atypical.  She is had a work-up for this same type of pain and there was no suggestion of a cardiac etiology.  I would suggest continued follow-up with her primary provider.  No further cardiac testing for this.  HTN:  The blood pressure is at target.  She will continue the meds as listed.   DM: Her A1c was 9.8.  This is worse than previous although it has been greater than 14 in the past.  She seems to have minimal understanding of her diabetes or management but has follow-up apparently with somebody who will be adjusting her medications.  SYNCOPE: She has not had any recent syncopal episodes.  Work-up as above.  Current medicines are reviewed at length with the patient today.  The patient does not have concerns regarding medicines.  The following changes have been made:  None ca Labs/ tests ordered today include:    Orders Placed This Encounter  Procedures  . TSH  . HOLTER MONITOR - 24 HOUR     Disposition:   FU with me as needed based on the results of the above.Ronnell Guadalajara, MD  05/07/2018 1:34 PM    [facility]

## 2018-05-07 ENCOUNTER — Encounter: Payer: Self-pay | Admitting: Cardiology

## 2018-05-07 ENCOUNTER — Ambulatory Visit (INDEPENDENT_AMBULATORY_CARE_PROVIDER_SITE_OTHER): Payer: Medicare Other | Admitting: Cardiology

## 2018-05-07 ENCOUNTER — Other Ambulatory Visit: Payer: Medicare Other

## 2018-05-07 VITALS — BP 117/68 | HR 92 | Ht 67.0 in | Wt 147.4 lb

## 2018-05-07 DIAGNOSIS — R55 Syncope and collapse: Secondary | ICD-10-CM

## 2018-05-07 DIAGNOSIS — R002 Palpitations: Secondary | ICD-10-CM

## 2018-05-07 DIAGNOSIS — R079 Chest pain, unspecified: Secondary | ICD-10-CM | POA: Insufficient documentation

## 2018-05-07 NOTE — Patient Instructions (Addendum)
Medication Instructions:  The current medical regimen is effective;  continue present plan and medications.  If you need a refill on your cardiac medications before your next appointment, please call your pharmacy.   Lab work: Please have blood work. (TSH) If you have labs (blood work) drawn today and your tests are completely normal, you will receive your results only by: Marland Kitchen MyChart Message (if you have MyChart) OR . A paper copy in the mail If you have any lab test that is abnormal or we need to change your treatment, we will call you to review the results.  Testing/Procedures: Your physician has recommended that you wear a holter monitor for 24 hours. Holter monitors are medical devices that record the heart's electrical activity. Doctors most often use these monitors to diagnose arrhythmias. Arrhythmias are problems with the speed or rhythm of the heartbeat. The monitor is a small, portable device. You can wear one while you do your normal daily activities. This is usually used to diagnose what is causing palpitations/syncope (passing out).  Follow-Up: Follow up as needed with Dr Percival Spanish after the above testing.   Thank you for choosing [facility]!!

## 2018-05-08 LAB — TSH: TSH: 1.94 u[IU]/mL (ref 0.450–4.500)

## 2018-05-09 ENCOUNTER — Ambulatory Visit: Payer: Medicare Other

## 2018-05-09 ENCOUNTER — Ambulatory Visit (INDEPENDENT_AMBULATORY_CARE_PROVIDER_SITE_OTHER): Payer: Medicare Other

## 2018-05-09 DIAGNOSIS — R55 Syncope and collapse: Secondary | ICD-10-CM | POA: Diagnosis not present

## 2018-05-09 DIAGNOSIS — R002 Palpitations: Secondary | ICD-10-CM

## 2018-05-09 NOTE — Progress Notes (Unsigned)
48 hour holter placed [id number]

## 2018-05-12 ENCOUNTER — Other Ambulatory Visit: Payer: Self-pay | Admitting: *Deleted

## 2018-05-12 MED ORDER — ENALAPRIL MALEATE 20 MG PO TABS: 40.0000 mg | ORAL_TABLET | Freq: Every day | ORAL | 0 refills | Status: DC

## 2018-05-16 ENCOUNTER — Other Ambulatory Visit: Payer: Self-pay | Admitting: *Deleted

## 2018-05-16 MED ORDER — PANTOPRAZOLE SODIUM 40 MG PO TBEC: 40.0000 mg | DELAYED_RELEASE_TABLET | Freq: Every day | ORAL | 1 refills | Status: DC

## 2018-05-21 ENCOUNTER — Other Ambulatory Visit: Payer: Self-pay | Admitting: *Deleted

## 2018-05-22 ENCOUNTER — Other Ambulatory Visit: Payer: Self-pay | Admitting: *Deleted

## 2018-05-26 NOTE — Progress Notes (Signed)
Patient needs to have Vit D Labs drawn.  It looks like these were ordered last year and she has not had them performed.

## 2018-05-26 NOTE — Progress Notes (Signed)
Pt notified needs labs

## 2018-05-28 DIAGNOSIS — Z13820 Encounter for screening for osteoporosis: Secondary | ICD-10-CM | POA: Diagnosis not present

## 2018-05-28 DIAGNOSIS — M0510 Rheumatoid lung disease with rheumatoid arthritis of unspecified site: Secondary | ICD-10-CM | POA: Diagnosis not present

## 2018-05-28 DIAGNOSIS — M069 Rheumatoid arthritis, unspecified: Secondary | ICD-10-CM | POA: Diagnosis not present

## 2018-05-28 DIAGNOSIS — Z79899 Other long term (current) drug therapy: Secondary | ICD-10-CM | POA: Diagnosis not present

## 2018-05-28 DIAGNOSIS — E119 Type 2 diabetes mellitus without complications: Secondary | ICD-10-CM | POA: Diagnosis not present

## 2018-06-02 ENCOUNTER — Telehealth: Payer: Self-pay | Admitting: Family Medicine

## 2018-06-02 NOTE — Telephone Encounter (Signed)
Yes, she may have refills.

## 2018-06-02 NOTE — Telephone Encounter (Signed)
Pt has appt 07/07/18 with you, ok ot refill folic acid?

## 2018-06-03 MED ORDER — FOLIC ACID 1 MG PO TABS: 1.0000 mg | ORAL_TABLET | Freq: Every day | ORAL | 1 refills | Status: DC

## 2018-06-09 NOTE — Telephone Encounter (Signed)
Attempted to contact pt without return call in over 3 days without return call, will close call.

## 2018-06-20 ENCOUNTER — Other Ambulatory Visit: Payer: Medicare Other

## 2018-06-20 ENCOUNTER — Encounter: Payer: Self-pay | Admitting: *Deleted

## 2018-06-20 DIAGNOSIS — E559 Vitamin D deficiency, unspecified: Secondary | ICD-10-CM

## 2018-06-21 LAB — VITAMIN D 25 HYDROXY (VIT D DEFICIENCY, FRACTURES): Vit D, 25-Hydroxy: 38.4 ng/mL (ref 30.0–100.0)

## 2018-07-07 ENCOUNTER — Encounter: Payer: Self-pay | Admitting: Family Medicine

## 2018-07-07 ENCOUNTER — Ambulatory Visit (INDEPENDENT_AMBULATORY_CARE_PROVIDER_SITE_OTHER): Payer: Medicare Other | Admitting: Family Medicine

## 2018-07-07 VITALS — BP 160/88 | HR 87 | Temp 97.9°F | Ht 67.0 in | Wt 146.0 lb

## 2018-07-07 DIAGNOSIS — E785 Hyperlipidemia, unspecified: Secondary | ICD-10-CM | POA: Insufficient documentation

## 2018-07-07 DIAGNOSIS — E1121 Type 2 diabetes mellitus with diabetic nephropathy: Secondary | ICD-10-CM

## 2018-07-07 DIAGNOSIS — E1169 Type 2 diabetes mellitus with other specified complication: Secondary | ICD-10-CM | POA: Diagnosis not present

## 2018-07-07 DIAGNOSIS — Z9114 Patient's other noncompliance with medication regimen: Secondary | ICD-10-CM

## 2018-07-07 DIAGNOSIS — Z794 Long term (current) use of insulin: Secondary | ICD-10-CM | POA: Diagnosis not present

## 2018-07-07 DIAGNOSIS — Z91148 Patient's other noncompliance with medication regimen for other reason: Secondary | ICD-10-CM

## 2018-07-07 DIAGNOSIS — E1165 Type 2 diabetes mellitus with hyperglycemia: Secondary | ICD-10-CM

## 2018-07-07 DIAGNOSIS — I129 Hypertensive chronic kidney disease with stage 1 through stage 4 chronic kidney disease, or unspecified chronic kidney disease: Secondary | ICD-10-CM

## 2018-07-07 DIAGNOSIS — E1122 Type 2 diabetes mellitus with diabetic chronic kidney disease: Secondary | ICD-10-CM

## 2018-07-07 DIAGNOSIS — N183 Chronic kidney disease, stage 3 unspecified: Secondary | ICD-10-CM

## 2018-07-07 DIAGNOSIS — IMO0002 Reserved for concepts with insufficient information to code with codable children: Secondary | ICD-10-CM

## 2018-07-07 LAB — BAYER DCA HB A1C WAIVED: HB A1C: 11.5 % — AB (ref <7.0)

## 2018-07-07 MED ORDER — INSULIN GLARGINE 100 UNIT/ML SOLOSTAR PEN: PEN_INJECTOR | SUBCUTANEOUS | 2 refills | Status: DC

## 2018-07-07 MED ORDER — GLUCOSE BLOOD VI STRP: ORAL_STRIP | 12 refills | Status: DC

## 2018-07-07 MED ORDER — BLOOD GLUCOSE MONITOR KIT: PACK | 0 refills | Status: DC

## 2018-07-07 MED ORDER — GABAPENTIN 400 MG PO CAPS: 400.0000 mg | ORAL_CAPSULE | Freq: Every day | ORAL | 1 refills | Status: DC

## 2018-07-07 MED ORDER — ATORVASTATIN CALCIUM 10 MG PO TABS: 10.0000 mg | ORAL_TABLET | Freq: Every day | ORAL | 3 refills | Status: DC

## 2018-07-07 MED ORDER — LANCETS MISC: 12 refills | Status: DC

## 2018-07-07 MED ORDER — CARVEDILOL 12.5 MG PO TABS: 12.5000 mg | ORAL_TABLET | Freq: Two times a day (BID) | ORAL | Status: DC

## 2018-07-07 MED ORDER — ENALAPRIL MALEATE 20 MG PO TABS: 40.0000 mg | ORAL_TABLET | Freq: Every day | ORAL | 1 refills | Status: DC

## 2018-07-07 NOTE — Patient Instructions (Addendum)
Your blood sugar is NOT controlled.  You were supposed to follow up with Dr Dorris Fetch for your sugar in September.  It is very important for you to see him again.  If you have not been using the insulin every day as you are supposed to, I want you to start using it every days.  Otherwise, you should increase your dose to 32 units and make a follow up visit with him as soon as possible.  Get your diabetic eye exam done.  Take your blood pressure medications every day as directed and follow up in 1 month for recheck.

## 2018-07-07 NOTE — Progress Notes (Signed)
Subjective: CC:DM2/ HTN2 PCP: [name], DO HPI:Erica Crosby is a 73 y.o. female presenting to clinic today for:  1. Type 2 Diabetes w/ HTN and HLD  Patient reports: Glucometer: accucheck and reports its not working right, High at home: <200 (only checks once daily), Taking medication(s): Lantus 30u when she remembers, Lipitor 10 and Enalapril '40mg'$  and Coreg 12.5 (has not taken her BP meds today).  Side effects: none  Last eye exam: >1 year Last foot exam: unknown Last A1c: 9.8 in 02/2018 Nephropathy screen indicated?: on ACE-I Last flu, zoster and/or pneumovax: declines  ROS: Denies LOC, polyuria, polydipsia, unintended weight loss/gain, foot ulcerations She has numbness or tingling in extremities and needs refill of Gabapentin.  She denies CP/ SOB  She goes on to state that she has not followed up with Dr. Dorris Fetch, her endocrinologist.  She is unable to tell me why but often resorts to lack of transportation.   ROS: Per HPI  No Known Allergies Past Medical History:  Diagnosis Date  . Arthritis   . Cataract   . Coronary artery disease    Mild plaque 2012  . Diabetes mellitus   . Essential hypertension 02/10/2012  . GERD (gastroesophageal reflux disease)   . Hyperlipidemia   . Hypertension   . Neuropathy   . Peptic ulcer   . Rheumatoid arthritis ([place])   . TIA (transient ischemic attack) 02/10/2012    Current Outpatient Medications:  .  ACCU-CHEK AVIVA PLUS test strip, USE 1 STRIP TO CHECK GLUCOSE TWICE DAILY, Disp: 100 each, Rfl: 3 .  alendronate (FOSAMAX) 70 MG tablet, Take 1 tablet (70 mg total) by mouth once a week., Disp: 90 tablet, Rfl: 3 .  aspirin EC 81 MG tablet, Take 81 mg by mouth daily., Disp: , Rfl:  .  atorvastatin (LIPITOR) 10 MG tablet, Take 1 tablet (10 mg total) by mouth daily., Disp: 90 tablet, Rfl: 3 .  carvedilol (COREG) 12.5 MG tablet, Take 0.5 tablets (6.25 mg total) by mouth 2 (two) times daily with a meal. (Patient taking differently: Take  12.5 mg by mouth 2 (two) times daily with a meal. ), Disp: , Rfl:  .  enalapril (VASOTEC) 20 MG tablet, Take 2 tablets (40 mg total) by mouth daily., Disp: 180 tablet, Rfl: 0 .  folic acid (FOLVITE) 1 MG tablet, Take 1 tablet (1 mg total) by mouth daily., Disp: 90 tablet, Rfl: 1 .  gabapentin (NEURONTIN) 400 MG capsule, TAKE 1 CAPSULE BY MOUTH AT BEDTIME, Disp: 30 capsule, Rfl: 0 .  glucose blood (ACCU-CHEK AVIVA) test strip, Used to test blood glucose 2 times a day., Disp: 100 each, Rfl: 3 .  Insulin Glargine (LANTUS SOLOSTAR) 100 UNIT/ML Solostar Pen, INJECT 30 UNITS INTO THE SKIN AT BEDTIME, Disp: 15 mL, Rfl: 2 .  Lancets (ACCU-CHEK MULTICLIX) lancets, Use to check BG bid (Dx: E11.8, E11.65, Z79.4 - type 2 dm requiring insulin therapy), Disp: 102 each, Rfl: 5 .  leflunomide (ARAVA) 20 MG tablet, Take 1 tablet by mouth daily., Disp: , Rfl:  .  Omega-3 Fatty Acids (FISH OIL) 1000 MG CAPS, Take 1 capsule by mouth daily. , Disp: , Rfl:  .  pantoprazole (PROTONIX) 40 MG tablet, Take 1 tablet (40 mg total) by mouth daily., Disp: 90 tablet, Rfl: 1 .  predniSONE (DELTASONE) 5 MG tablet, Take 2.5 mg by mouth daily. , Disp: , Rfl:  .  Vitamin D, Ergocalciferol, (DRISDOL) 50000 units CAPS capsule, TAKE 1 CAPSULE BY MOUTH  EVERY 14 DAYS, Disp: 6 capsule, Rfl: 1 Social History   Socioeconomic History  . Marital status: Widowed    Spouse name: Not on file  . Number of children: 3  . Years of education: Not on file  . Highest education level: Not on file  Occupational History  . Not on file  Social Needs  . Financial resource strain: Not on file  . Food insecurity:    Worry: Not on file    Inability: Not on file  . Transportation needs:    Medical: Not on file    Non-medical: Not on file  Tobacco Use  . Smoking status: Former Smoker    Years: 25.00    Types: Cigarettes, E-cigarettes    Last attempt to quit: 02/02/2013    Years since quitting: 5.4  . Smokeless tobacco: Never Used  Substance and  Sexual Activity  . Alcohol use: No  . Drug use: No  . Sexual activity: Not Currently    Birth control/protection: Post-menopausal  Lifestyle  . Physical activity:    Days per week: Not on file    Minutes per session: Not on file  . Stress: Not on file  Relationships  . Social connections:    Talks on phone: Not on file    Gets together: Not on file    Attends religious service: Not on file    Active member of club or organization: Not on file    Attends meetings of clubs or organizations: Not on file    Relationship status: Not on file  . Intimate partner violence:    Fear of current or ex partner: Not on file    Emotionally abused: Not on file    Physically abused: Not on file    Forced sexual activity: Not on file  Other Topics Concern  . Not on file  Social History Narrative   Daughter stays with her.    Family History  Problem Relation Age of Onset  . Heart disease Sister        [place] (died age 36) with "enlarged heart"  . CAD Sister   . Diabetes Mother   . Heart disease Mother        Later onset heart disease   . Dementia Mother   . Alcohol abuse Father   . Liver disease Father   . CAD Sister 16       CABG  . Early death Sister   . Diabetes Brother   . Kidney disease Brother        related to DM  . Diabetes Brother     Objective: Office vital signs reviewed. BP (!) 160/88   Pulse 87   Temp 97.9 F (36.6 C) (Oral)   Ht _0  (1.702 m)   Wt 146 lb (66.2 kg)   BMI 22.87 kg/m   Physical Examination:  General: Awake, alert, No acute distress HEENT: Normal, sclera white Cardio: regular rate and rhythm, S1S2 heard, no murmurs appreciated Pulm: clear to auscultation bilaterally, no wheezes, rhonchi or rales; normal work of breathing on room air Extremities: warm, well perfused, No edema, cyanosis or clubbing; +2 pulses bilaterally MSK: uses cane for ambulation Skin: dry; intact; no rashes or lesions Neuro: see DM foot exam below Diabetic Foot Exam -  Simple   Simple Foot Form Diabetic Foot exam was performed with the following findings:  Yes 07/07/2018  5:13 PM  Visual Inspection See comments:  Yes Sensation Testing Intact to touch and monofilament testing  bilaterally:  Yes Pulse Check Posterior Tibialis and Dorsalis pulse intact bilaterally:  Yes Comments Decreased vibratory sense in bilateral great toes.  She has onychomycotic changes within the toenails.  She has some dry skin but no substantial callus formation or skin breakdown.  Certainly no evidence of ulceration.     Assessment/ Plan: 73 y.o. female   1. Uncontrolled type 2 diabetes mellitus with diabetic nephropathy, with long-term current use of insulin (HCC) A1c continues to rise and I think that this is related to patient's noncompliance with her Lantus.  She seems very nonchalant about the fact that she has not been injecting her medications as directed and often tends to deflect questions with regards to her health.  We discussed that blood sugars can be life-threatening and I reemphasized the importance of compliance with the Lantus.  If she continues to have fasting blood sugars greater than 150, I have advised her to increase her Lantus to 32 units and follow-up here in office.  I would certainly appreciate if she would follow-up with her endocrinologist, as he was able to get her blood sugars controlled in the past.  I have sent in refills of her medications including providing her with a hardcopy prescription for a glucose meter, lancets and strips.  She thinks she needs refills on pen needles for her Lantus Solostar and will have the pharmacy send in the refill request, she is unable to tell me the gauge of the pen needles today.  She is to schedule her diabetic eye exam and I have referred her to the eye care specialist in [place], where she gets her medicines.  Her foot exam was performed today and noted decreased vibratory sense but otherwise no monofilament changes. -  Bayer DCA Hb A1c Waived  2. 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  I stressed the importance of taking her medicine as directed and she is to follow-up for blood pressure recheck with the nurse in 1 week.  3. Hyperlipidemia associated with type 2 diabetes mellitus ([place]) Continue Lipitor 10.  4. Noncompliance with medication regimen Counseling provided today.   Orders Placed This Encounter  Procedures  . Bayer DCA Hb A1c Waived   Meds ordered this encounter  Medications  . atorvastatin (LIPITOR) 10 MG tablet    Sig: Take 1 tablet (10 mg total) by mouth daily.    Dispense:  90 tablet    Refill:  3  . carvedilol (COREG) 12.5 MG tablet    Sig: Take 1 tablet (12.5 mg total) by mouth 2 (two) times daily with a meal.  . enalapril (VASOTEC) 20 MG tablet    Sig: Take 2 tablets (40 mg total) by mouth daily.    Dispense:  180 tablet    Refill:  1  . gabapentin (NEURONTIN) 400 MG capsule    Sig: Take 1 capsule (400 mg total) by mouth at bedtime.    Dispense:  90 capsule    Refill:  1  . Insulin Glargine (LANTUS SOLOSTAR) 100 UNIT/ML Solostar Pen    Sig: INJECT 30 UNITS INTO THE SKIN AT BEDTIME    Dispense:  15 mL    Refill:  2    Please consider 90 day supplies to promote better adherence  . glucose blood (ACCU-CHEK AVIVA) test strip    Sig: Test up to 3 times daily.  E11.21/E11.65    Dispense:  100 each    Refill:  12  . blood glucose meter kit and supplies KIT    Sig: Dispense based on patient and insurance preference. Use up to four times daily as directed. (E11.21/E11.65).    Dispense:  1 each    Refill:  0    Order Specific Question:   Number of strips    Answer:   100    Order Specific Question:   Number of lancets    Answer:   100  . Lancets MISC    Sig: Test up to 3 times daily E11.21/E11.65    Dispense:  100 each    Refill:  [place], DO [place] [id number]

## 2018-07-21 ENCOUNTER — Encounter: Payer: Self-pay | Admitting: *Deleted

## 2018-07-30 DIAGNOSIS — E1122 Type 2 diabetes mellitus with diabetic chronic kidney disease: Secondary | ICD-10-CM | POA: Diagnosis not present

## 2018-07-30 DIAGNOSIS — N183 Chronic kidney disease, stage 3 (moderate): Secondary | ICD-10-CM | POA: Diagnosis not present

## 2018-07-30 DIAGNOSIS — N179 Acute kidney failure, unspecified: Secondary | ICD-10-CM | POA: Diagnosis not present

## 2018-07-30 DIAGNOSIS — I129 Hypertensive chronic kidney disease with stage 1 through stage 4 chronic kidney disease, or unspecified chronic kidney disease: Secondary | ICD-10-CM | POA: Diagnosis not present

## 2018-07-30 DIAGNOSIS — M069 Rheumatoid arthritis, unspecified: Secondary | ICD-10-CM | POA: Diagnosis not present

## 2018-08-14 ENCOUNTER — Other Ambulatory Visit: Payer: Self-pay | Admitting: Family Medicine

## 2018-08-14 ENCOUNTER — Other Ambulatory Visit: Payer: Self-pay | Admitting: "Endocrinology

## 2018-10-02 ENCOUNTER — Other Ambulatory Visit: Payer: Self-pay | Admitting: *Deleted

## 2018-10-02 MED ORDER — ALENDRONATE SODIUM 70 MG PO TABS: 70.0000 mg | ORAL_TABLET | ORAL | 0 refills | Status: DC

## 2018-10-08 DIAGNOSIS — M19041 Primary osteoarthritis, right hand: Secondary | ICD-10-CM | POA: Diagnosis not present

## 2018-10-08 DIAGNOSIS — M19042 Primary osteoarthritis, left hand: Secondary | ICD-10-CM | POA: Diagnosis not present

## 2018-10-08 DIAGNOSIS — M069 Rheumatoid arthritis, unspecified: Secondary | ICD-10-CM | POA: Diagnosis not present

## 2018-10-08 DIAGNOSIS — Z13820 Encounter for screening for osteoporosis: Secondary | ICD-10-CM | POA: Diagnosis not present

## 2018-10-08 DIAGNOSIS — E119 Type 2 diabetes mellitus without complications: Secondary | ICD-10-CM | POA: Diagnosis not present

## 2018-10-08 DIAGNOSIS — M79641 Pain in right hand: Secondary | ICD-10-CM | POA: Diagnosis not present

## 2018-10-08 DIAGNOSIS — M79642 Pain in left hand: Secondary | ICD-10-CM | POA: Diagnosis not present

## 2018-10-08 DIAGNOSIS — Z79899 Other long term (current) drug therapy: Secondary | ICD-10-CM | POA: Diagnosis not present

## 2018-10-08 DIAGNOSIS — M0510 Rheumatoid lung disease with rheumatoid arthritis of unspecified site: Secondary | ICD-10-CM | POA: Diagnosis not present

## 2018-10-31 ENCOUNTER — Encounter: Payer: Self-pay | Admitting: Family Medicine

## 2018-10-31 ENCOUNTER — Ambulatory Visit (INDEPENDENT_AMBULATORY_CARE_PROVIDER_SITE_OTHER): Payer: Medicare Other | Admitting: Family Medicine

## 2018-10-31 ENCOUNTER — Other Ambulatory Visit: Payer: Self-pay

## 2018-10-31 DIAGNOSIS — H00011 Hordeolum externum right upper eyelid: Secondary | ICD-10-CM | POA: Diagnosis not present

## 2018-10-31 MED ORDER — POLYMYXIN B-TRIMETHOPRIM 10000-0.1 UNIT/ML-% OP SOLN: 1.0000 [drp] | OPHTHALMIC | 0 refills | Status: AC

## 2018-10-31 NOTE — Progress Notes (Signed)
Virtual Visit via telephone Note Due to COVID-19, visit is conducted virtually and was requested by patient. This visit type was conducted due to national recommendations for restrictions regarding the COVID-19 Pandemic (e.g. social distancing) in an effort to limit this patient's exposure and mitigate transmission in our community. All issues noted in this document were discussed and addressed.  A physical exam was not performed with this format.   I connected with Erica Crosby on 10/31/18 at [facility] by telephone and verified that I am speaking with the correct person using two identifiers. Erica Crosby is currently located at home and family is currently with them during visit. The provider, Monia Pouch, FNP is located in their office at time of visit.  I discussed the limitations, risks, security and privacy concerns of performing an evaluation and management service by telephone and the availability of in person appointments. I also discussed with the patient that there may be a patient responsible charge related to this service. The patient expressed understanding and agreed to proceed.  Subjective:  Patient ID: Erica Crosby, female    DOB: [birth year], 73 y.o.   MRN: [id number]  Chief Complaint:  Eye Problem (right)   HPI: Erica Crosby is a 73 y.o. female presenting on 10/31/2018 for Eye Problem (right)   Pt reports right upper eyelid swelling with redness and drainage. Pt states this started about 8 days ago and is not getting better with home remedies. No injury, FB, or chemical exposures. No visual disturbances.   Eye Problem   The right eye is affected. This is a new problem. The current episode started 1 to 4 weeks ago. The problem occurs constantly. The problem has been unchanged. There was no injury mechanism. The pain is at a severity of 3/10. The pain is mild. There is no known exposure to pink eye. She does not wear contacts. Associated symptoms include an eye discharge and eye  redness. Pertinent negatives include no blurred vision, double vision, fever, foreign body sensation, itching, nausea, photophobia, recent URI or vomiting. She has tried water (warm compresses) for the symptoms. The treatment provided no relief.     Relevant past medical, surgical, family, and social history reviewed and updated as indicated.  Allergies and medications reviewed and updated.   Past Medical History:  Diagnosis Date  . Arthritis   . Cataract   . Coronary artery disease    Mild plaque 2012  . Diabetes mellitus   . Essential hypertension 02/10/2012  . GERD (gastroesophageal reflux disease)   . Hyperlipidemia   . Hypertension   . Neuropathy   . Peptic ulcer   . Rheumatoid arthritis ([place])   . TIA (transient ischemic attack) 02/10/2012    Past Surgical History:  Procedure Laterality Date  . ABSCESS DRAINAGE    . CARPAL TUNNEL RELEASE Left   . CHOLECYSTECTOMY    . KNEE SURGERY Right   . NECK SURGERY     x 2  . SHOULDER SURGERY Left     Social History   Socioeconomic History  . Marital status: Widowed    Spouse name: Not on file  . Number of children: 3  . Years of education: Not on file  . Highest education level: Not on file  Occupational History  . Not on file  Social Needs  . Financial resource strain: Not on file  . Food insecurity:    Worry: Not on file    Inability: Not on file  . Transportation  needs:    Medical: Not on file    Non-medical: Not on file  Tobacco Use  . Smoking status: Former Smoker    Years: 25.00    Types: Cigarettes, E-cigarettes    Last attempt to quit: 02/02/2013    Years since quitting: 5.7  . Smokeless tobacco: Never Used  Substance and Sexual Activity  . Alcohol use: No  . Drug use: No  . Sexual activity: Not Currently    Birth control/protection: Post-menopausal  Lifestyle  . Physical activity:    Days per week: Not on file    Minutes per session: Not on file  . Stress: Not on file  Relationships  . Social  connections:    Talks on phone: Not on file    Gets together: Not on file    Attends religious service: Not on file    Active member of club or organization: Not on file    Attends meetings of clubs or organizations: Not on file    Relationship status: Not on file  . Intimate partner violence:    Fear of current or ex partner: Not on file    Emotionally abused: Not on file    Physically abused: Not on file    Forced sexual activity: Not on file  Other Topics Concern  . Not on file  Social History Narrative   Daughter stays with her.     Outpatient Encounter Medications as of 10/31/2018  Medication Sig  . alendronate (FOSAMAX) 70 MG tablet Take 1 tablet (70 mg total) by mouth once a week.  Marland Kitchen aspirin EC 81 MG tablet Take 81 mg by mouth daily.  Marland Kitchen atorvastatin (LIPITOR) 10 MG tablet Take 1 tablet (10 mg total) by mouth daily.  . blood glucose meter kit and supplies KIT Dispense based on patient and insurance preference. Use up to four times daily as directed. (E11.21/E11.65).  . carvedilol (COREG) 12.5 MG tablet Take 1 tablet (12.5 mg total) by mouth 2 (two) times daily with a meal.  . enalapril (VASOTEC) 20 MG tablet Take 2 tablets (40 mg total) by mouth daily.  . folic acid (FOLVITE) 1 MG tablet Take 1 tablet (1 mg total) by mouth daily.  Marland Kitchen gabapentin (NEURONTIN) 400 MG capsule Take 1 capsule (400 mg total) by mouth at bedtime.  Marland Kitchen glucose blood (ACCU-CHEK AVIVA) test strip Test up to 3 times daily.  E11.21/E11.65  . Insulin Glargine (LANTUS SOLOSTAR) 100 UNIT/ML Solostar Pen INJECT 30 UNITS INTO THE SKIN AT BEDTIME  . Lancets MISC Test up to 3 times daily E11.21/E11.65  . leflunomide (ARAVA) 20 MG tablet Take 1 tablet by mouth daily.  . Omega-3 Fatty Acids (FISH OIL) 1000 MG CAPS Take 1 capsule by mouth daily.   . pantoprazole (PROTONIX) 40 MG tablet Take 1 tablet (40 mg total) by mouth daily.  . predniSONE (DELTASONE) 5 MG tablet Take 2.5 mg by mouth daily.   Marland Kitchen trimethoprim-polymyxin  b (POLYTRIM) ophthalmic solution Place 1 drop into the right eye every 4 (four) hours for 5 days.  . Vitamin D, Ergocalciferol, (DRISDOL) 50000 units CAPS capsule TAKE 1 CAPSULE BY MOUTH EVERY 14 DAYS   No facility-administered encounter medications on file as of 10/31/2018.     No Known Allergies  Review of Systems  Constitutional: Negative for chills, fatigue and fever.  Eyes: Positive for pain, discharge and redness. Negative for blurred vision, double vision, photophobia, itching and visual disturbance.  Respiratory: Negative for cough and shortness of breath.  Cardiovascular: Negative for chest pain, palpitations and leg swelling.  Gastrointestinal: Negative for nausea and vomiting.  Neurological: Negative for dizziness, light-headedness and headaches.  Psychiatric/Behavioral: Negative for confusion.  All other systems reviewed and are negative.        Observations/Objective: No vital signs or physical exam, this was a telephone or virtual health encounter.  Pt alert and oriented, answers all questions appropriately, and able to speak in full sentences.    Assessment and Plan: Erica Crosby was seen today for eye problem.  Diagnoses and all orders for this visit:  Hordeolum externum right upper eyelid Reported symptoms consistent with hordeolum. Symptomatic care has not provided relief. Antibiotic drops for 5 days with continued symptomatic care. Report any new or worsening symptoms.  -     trimethoprim-polymyxin b (POLYTRIM) ophthalmic solution; Place 1 drop into the right eye every 4 (four) hours for 5 days.     Follow Up Instructions: Return if symptoms worsen or fail to improve.    I discussed the assessment and treatment plan with the patient. The patient was provided an opportunity to ask questions and all were answered. The patient agreed with the plan and demonstrated an understanding of the instructions.   The patient was advised to call back or seek an in-person  evaluation if the symptoms worsen or if the condition fails to improve as anticipated.  The above assessment and management plan was discussed with the patient. The patient verbalized understanding of and has agreed to the management plan. Patient is aware to call the clinic if symptoms persist or worsen. Patient is aware when to return to the clinic for a follow-up visit. Patient educated on when it is appropriate to go to the emergency department.    I provided 15 minutes of non-face-to-face time during this encounter. The call started at [place]. The call ended at 0900. The other time was used for coordination of care.    Monia Pouch, FNP-C [facility] [address] [place], [place] [address] [phone]

## 2018-11-04 ENCOUNTER — Ambulatory Visit: Payer: Medicare Other | Admitting: Family Medicine

## 2018-11-05 ENCOUNTER — Ambulatory Visit: Payer: Medicare Other | Admitting: Family Medicine

## 2018-11-27 ENCOUNTER — Other Ambulatory Visit: Payer: Self-pay | Admitting: Family Medicine

## 2018-12-12 ENCOUNTER — Other Ambulatory Visit: Payer: Self-pay | Admitting: Family Medicine

## 2018-12-19 ENCOUNTER — Other Ambulatory Visit: Payer: Self-pay | Admitting: Family Medicine

## 2018-12-26 ENCOUNTER — Telehealth: Payer: Self-pay | Admitting: Family Medicine

## 2018-12-26 NOTE — Telephone Encounter (Signed)
Yes, she can use on left.  Have her schedule in IN OFFICE visit.  I want to follow up on this weight loss.

## 2018-12-26 NOTE — Telephone Encounter (Signed)
NA, NVM 

## 2019-01-07 DIAGNOSIS — Z79899 Other long term (current) drug therapy: Secondary | ICD-10-CM | POA: Diagnosis not present

## 2019-01-07 DIAGNOSIS — Z13820 Encounter for screening for osteoporosis: Secondary | ICD-10-CM | POA: Diagnosis not present

## 2019-01-07 DIAGNOSIS — M0510 Rheumatoid lung disease with rheumatoid arthritis of unspecified site: Secondary | ICD-10-CM | POA: Diagnosis not present

## 2019-01-07 DIAGNOSIS — E119 Type 2 diabetes mellitus without complications: Secondary | ICD-10-CM | POA: Diagnosis not present

## 2019-01-07 DIAGNOSIS — M069 Rheumatoid arthritis, unspecified: Secondary | ICD-10-CM | POA: Diagnosis not present

## 2019-01-12 DIAGNOSIS — H25811 Combined forms of age-related cataract, right eye: Secondary | ICD-10-CM | POA: Diagnosis not present

## 2019-01-12 DIAGNOSIS — H2511 Age-related nuclear cataract, right eye: Secondary | ICD-10-CM | POA: Diagnosis not present

## 2019-01-12 DIAGNOSIS — Z01818 Encounter for other preprocedural examination: Secondary | ICD-10-CM | POA: Diagnosis not present

## 2019-01-12 DIAGNOSIS — E119 Type 2 diabetes mellitus without complications: Secondary | ICD-10-CM | POA: Diagnosis not present

## 2019-02-04 DIAGNOSIS — I129 Hypertensive chronic kidney disease with stage 1 through stage 4 chronic kidney disease, or unspecified chronic kidney disease: Secondary | ICD-10-CM | POA: Diagnosis not present

## 2019-02-04 DIAGNOSIS — N179 Acute kidney failure, unspecified: Secondary | ICD-10-CM | POA: Diagnosis not present

## 2019-02-04 DIAGNOSIS — N183 Chronic kidney disease, stage 3 (moderate): Secondary | ICD-10-CM | POA: Diagnosis not present

## 2019-02-04 DIAGNOSIS — M069 Rheumatoid arthritis, unspecified: Secondary | ICD-10-CM | POA: Diagnosis not present

## 2019-02-04 DIAGNOSIS — E1122 Type 2 diabetes mellitus with diabetic chronic kidney disease: Secondary | ICD-10-CM | POA: Diagnosis not present

## 2019-02-10 ENCOUNTER — Ambulatory Visit (INDEPENDENT_AMBULATORY_CARE_PROVIDER_SITE_OTHER): Payer: Medicare Other | Admitting: *Deleted

## 2019-02-10 DIAGNOSIS — Z0000 Encounter for general adult medical examination without abnormal findings: Secondary | ICD-10-CM | POA: Diagnosis not present

## 2019-02-10 NOTE — Patient Instructions (Signed)
Preventive Care 38 Years and Older, Female Preventive care refers to lifestyle choices and visits with your health care provider that can promote health and wellness. This includes:  A yearly physical exam. This is also called an annual well check.  Regular dental and eye exams.  Immunizations.  Screening for certain conditions.  Healthy lifestyle choices, such as diet and exercise. What can I expect for my preventive care visit? Physical exam Your health care provider will check:  Height and weight. These may be used to calculate body mass index (BMI), which is a measurement that tells if you are at a healthy weight.  Heart rate and blood pressure.  Your skin for abnormal spots. Counseling Your health care provider may ask you questions about:  Alcohol, tobacco, and drug use.  Emotional well-being.  Home and relationship well-being.  Sexual activity.  Eating habits.  History of falls.  Memory and ability to understand (cognition).  Work and work Statistician.  Pregnancy and menstrual history. What immunizations do I need?  Influenza (flu) vaccine  This is recommended every year. Tetanus, diphtheria, and pertussis (Tdap) vaccine  You may need a Td booster every 10 years. Varicella (chickenpox) vaccine  You may need this vaccine if you have not already been vaccinated. Zoster (shingles) vaccine  You may need this after age 72. Pneumococcal conjugate (PCV13) vaccine  One dose is recommended after age 72. Pneumococcal polysaccharide (PPSV23) vaccine  One dose is recommended after age 72. Measles, mumps, and rubella (MMR) vaccine  You may need at least one dose of MMR if you were born in 1957 or later. You may also need a second dose. Meningococcal conjugate (MenACWY) vaccine  You may need this if you have certain conditions. Hepatitis A vaccine  You may need this if you have certain conditions or if you travel or work in places where you may be exposed  to hepatitis A. Hepatitis B vaccine  You may need this if you have certain conditions or if you travel or work in places where you may be exposed to hepatitis B. Haemophilus influenzae type b (Hib) vaccine  You may need this if you have certain conditions. You may receive vaccines as individual doses or as more than one vaccine together in one shot (combination vaccines). Talk with your health care provider about the risks and benefits of combination vaccines. What tests do I need? Blood tests  Lipid and cholesterol levels. These may be checked every 5 years, or more frequently depending on your overall health.  Hepatitis C test.  Hepatitis B test. Screening  Lung cancer screening. You may have this screening every year starting at age 72 if you have a 30-pack-year history of smoking and currently smoke or have quit within the past 15 years.  Colorectal cancer screening. All adults should have this screening starting at age 72 and continuing until age 15. Your health care provider may recommend screening at age 72 if you are at increased risk. You will have tests every 1-10 years, depending on your results and the type of screening test.  Diabetes screening. This is done by checking your blood sugar (glucose) after you have not eaten for a while (fasting). You may have this done every 1-3 years.  Mammogram. This may be done every 1-2 years. Talk with your health care provider about how often you should have regular mammograms.  BRCA-related cancer screening. This may be done if you have a family history of breast, ovarian, tubal, or peritoneal cancers.  Other tests  Sexually transmitted disease (STD) testing.  Bone density scan. This is done to screen for osteoporosis. You may have this done starting at age 72. Follow these instructions at home: Eating and drinking  Eat a diet that includes fresh fruits and vegetables, whole grains, lean protein, and low-fat dairy products. Limit  your intake of foods with high amounts of sugar, saturated fats, and salt.  Take vitamin and mineral supplements as recommended by your health care provider.  Do not drink alcohol if your health care provider tells you not to drink.  If you drink alcohol: ? Limit how much you have to 0-1 drink a day. ? Be aware of how much alcohol is in your drink. In the [place] one drink equals one 12 oz bottle of beer (355 mL), one 5 oz glass of wine (148 mL), or one 1 oz glass of hard liquor (44 mL). Lifestyle  Take daily care of your teeth and gums.  Stay active. Exercise for at least 30 minutes on 5 or more days each week.  Do not use any products that contain nicotine or tobacco, such as cigarettes, e-cigarettes, and chewing tobacco. If you need help quitting, ask your health care provider.  If you are sexually active, practice safe sex. Use a condom or other form of protection in order to prevent STIs (sexually transmitted infections).  Talk with your health care provider about taking a low-dose aspirin or statin. What's next?  Go to your health care provider once a year for a well check visit.  Ask your health care provider how often you should have your eyes and teeth checked.  Stay up to date on all vaccines. This information is not intended to replace advice given to you by your health care provider. Make sure you discuss any questions you have with your health care provider. Document Released: 07/01/2015 Document Revised: 05/29/2018 Document Reviewed: 05/29/2018 Elsevier Patient Education  2020 [employer].

## 2019-02-10 NOTE — Progress Notes (Addendum)
MEDICARE ANNUAL WELLNESS VISIT  02/10/2019  Telephone Visit Disclaimer This Medicare AWV was conducted by telephone due to national recommendations for restrictions regarding the COVID-19 Pandemic (e.g. social distancing).  I verified, using two identifiers, that I am speaking with Erica Crosby or their authorized healthcare agent. I discussed the limitations, risks, security, and privacy concerns of performing an evaluation and management service by telephone and the potential availability of an in-person appointment in the future. The patient expressed understanding and agreed to proceed.   Subjective:  Erica Crosby is a 73 y.o. female patient of [name], DO who had a Medicare Annual Wellness Visit today via telephone. Erica Crosby is Retired and lives with their family. she has 3 children. she reports that she is socially active and does interact with friends/family regularly. she is not physically active and enjoys doing word search puzzles and putting jigsaw puzzles together.  Patient Care Team: [name], DO as PCP - General (Family Medicine) Minus Breeding, MD as PCP - Cardiology (Cardiology) Valinda Party, MD as Consulting Physician (Rheumatology) Cassandria Anger, MD as Consulting Physician (Endocrinology)  Advanced Directives 02/10/2019 02/23/2018 08/14/2017 01/21/2017 11/14/2016 08/22/2016 02/10/2012  Does Patient Have a Medical Advance Directive? No No No No No No Patient does not have advance directive;Patient would not like information  Would patient like information on creating a medical advance directive? No - Patient declined No - Patient declined No - Patient declined - - Yes (MAU/Ambulatory/Procedural Areas - Information given) -    Hospital Utilization Over the Past 12 Months: # of hospitalizations or ER visits: 1 # of surgeries: 1  Review of Systems    Patient reports that her overall health is worse compared to last year.  Patient Reported Readings  (BP, Pulse, CBG, Weight, etc) none  Review of Systems: History obtained from chart review  All other systems negative.  Pain Assessment Pain : No/denies pain     Current Medications & Allergies (verified) Allergies as of 02/10/2019   No Known Allergies     Medication List       Accurate as of February 10, 2019  4:20 PM. If you have any questions, ask your nurse or doctor.        STOP taking these medications   predniSONE 5 MG tablet Commonly known as: DELTASONE   Vitamin D (Ergocalciferol) 1.25 MG (50000 UT) Caps capsule Commonly known as: DRISDOL     TAKE these medications   alendronate 70 MG tablet Commonly known as: FOSAMAX Take 1 tablet by mouth once a week   aspirin EC 81 MG tablet Take 81 mg by mouth daily.   atorvastatin 10 MG tablet Commonly known as: LIPITOR Take 1 tablet (10 mg total) by mouth daily.   blood glucose meter kit and supplies Kit Dispense based on patient and insurance preference. Use up to four times daily as directed. (E11.21/E11.65).   carvedilol 12.5 MG tablet Commonly known as: COREG Take 1 tablet (12.5 mg total) by mouth 2 (two) times daily with a meal.   enalapril 20 MG tablet Commonly known as: VASOTEC Take 2 tablets (40 mg total) by mouth daily.   Fish Oil 1000 MG Caps Take 1 capsule by mouth daily.   folic acid 1 MG tablet Commonly known as: FOLVITE Take 1 tablet by mouth once daily   gabapentin 400 MG capsule Commonly known as: NEURONTIN Take 1 capsule (400 mg total) by mouth at bedtime.   glucose blood  test strip Commonly known as: Accu-Chek Aviva Test up to 3 times daily.  E11.21/E11.65   HUMIRA PEN [place] Inject into the skin every 14 (fourteen) days.   Insulin Glargine 100 UNIT/ML Solostar Pen Commonly known as: Lantus SoloStar INJECT 30 UNITS INTO THE SKIN AT BEDTIME   Lancets Misc Test up to 3 times daily E11.21/E11.65   leflunomide 20 MG tablet Commonly known as: ARAVA Take 1 tablet by mouth daily.    pantoprazole 40 MG tablet Commonly known as: PROTONIX Take 1 tablet by mouth once daily       History (reviewed): Past Medical History:  Diagnosis Date  . Arthritis   . Cataract   . Coronary artery disease    Mild plaque 2012  . Diabetes mellitus   . Essential hypertension 02/10/2012  . GERD (gastroesophageal reflux disease)   . Hyperlipidemia   . Hypertension   . Neuropathy   . Peptic ulcer   . Rheumatoid arthritis ([place])   . TIA (transient ischemic attack) 02/10/2012   Past Surgical History:  Procedure Laterality Date  . ABSCESS DRAINAGE    . CARPAL TUNNEL RELEASE Left   . CHOLECYSTECTOMY    . KNEE SURGERY Right   . NECK SURGERY     x 2  . SHOULDER SURGERY Left    Family History  Problem Relation Age of Onset  . Heart disease Sister        Congeital (died age 75) with "enlarged heart"  . CAD Sister   . Diabetes Mother   . Heart disease Mother        Later onset heart disease   . Dementia Mother   . Alcohol abuse Father   . Liver disease Father   . CAD Sister 27       CABG  . Early death Sister   . Diabetes Brother   . Kidney disease Brother        related to DM  . Diabetes Brother    Social History   Socioeconomic History  . Marital status: Widowed    Spouse name: Not on file  . Number of children: 3  . Years of education: 70  . Highest education level: High school graduate  Occupational History  . Not on file  Social Needs  . Financial resource strain: Not hard at all  . Food insecurity    Worry: Never true    Inability: Never true  . Transportation needs    Medical: No    Non-medical: No  Tobacco Use  . Smoking status: Former Smoker    Years: 25.00    Types: Cigarettes, E-cigarettes    Quit date: 02/02/2013    Years since quitting: 6.0  . Smokeless tobacco: Never Used  Substance and Sexual Activity  . Alcohol use: No  . Drug use: No  . Sexual activity: Not Currently    Birth control/protection: Post-menopausal  Lifestyle  . Physical  activity    Days per week: 0 days    Minutes per session: 0 min  . Stress: Only a little  Relationships  . Social Herbalist on phone: Three times a week    Gets together: More than three times a week    Attends religious service: Never    Active member of club or organization: No    Attends meetings of clubs or organizations: Never    Relationship status: Widowed  Other Topics Concern  . Not on file  Social History Narrative  Daughter stays with her.     Activities of Daily Living In your present state of health, do you have any difficulty performing the following activities: 02/10/2019  Hearing? Y  Comment she thinks it is due to wax build up  Vision? N  Difficulty concentrating or making decisions? N  Walking or climbing stairs? N  Dressing or bathing? Y  Comment pt can dress and take a shower by herself but she likes to make sure she has someone home so she doesn't fall  Doing errands, shopping? Y  Comment someone always drives her to appointments and to do grocery shopping  Preparing Food and eating ? Y  Comment she can feed herself but her son brings her food or her daughter cooks for her  Using the Toilet? N  In the past six months, have you accidently leaked urine? Y  Comment pt wears adult pull ups all the time  Do you have problems with loss of bowel control? Y  Comment she has in the past depending on what she eats and then not being able to get to the bathroom in time  Managing your Medications? N  Managing your Finances? Y  Comment her kids help her sometimes by reminding her when certain bills are due  Housekeeping or managing your Housekeeping? Y  Comment her children help her do the housekeeping  Some recent data might be hidden    Patient Education/ Literacy How often do you need to have someone help you when you read instructions, pamphlets, or other written materials from your doctor or pharmacy?: 3 - Sometimes(some of this is due to her  vision problems) What is the last grade level you completed in school?: 12th grade  Exercise Current Exercise Habits: The patient does not participate in regular exercise at present, Exercise limited by: orthopedic condition(s)  Diet Patient reports consuming 2 meals a day and 1 snack(s) a day Patient reports that her primary diet is: Regular Patient reports that she does have regular access to food.   Depression Screen PHQ 2/9 Scores 02/10/2019 07/07/2018 03/06/2018 11/28/2017 11/28/2017 10/14/2017 08/14/2017  PHQ - 2 Score 0 0 0 0 0 0 0  PHQ- 9 Score - 0 - - - - -     Fall Risk Fall Risk  02/10/2019 07/07/2018 03/06/2018 11/28/2017 11/28/2017  Falls in the past year? 1 0 No Yes No  Number falls in past yr: 1 - - 1 -  Injury with Fall? 0 - - No -  Comment - - - - -  Risk Factor Category  - - - - -  Risk for fall due to : Impaired mobility - - - -  Risk for fall due to: Comment - - - - -  Follow up Falls prevention discussed - - - -  Comment advised to get rid of any throw rugs, loose cords in walkway, having adequate lighting and grabrails in the bathroom - - - -     Objective:  Erica Crosby seemed alert and oriented and she participated appropriately during our telephone visit.  Blood Pressure Weight BMI  BP Readings from Last 3 Encounters:  07/07/18 (!) 160/88  05/07/18 117/68  03/06/18 130/76   Wt Readings from Last 3 Encounters:  07/07/18 146 lb (66.2 kg)  05/07/18 147 lb 6.4 oz (66.9 kg)  03/06/18 150 lb (68 kg)   BMI Readings from Last 1 Encounters:  07/07/18 22.87 kg/m    *Unable to obtain current vital signs, weight,  and BMI due to telephone visit type  Hearing/Vision  . Erica Crosby did not seem to have difficulty with hearing/understanding during the telephone conversation . Reports that she has had a formal eye exam by an eye care professional within the past year . Reports that she has not had a formal hearing evaluation within the past year *Unable to fully assess hearing  and vision during telephone visit type  Cognitive Function: 6CIT Screen 02/10/2019  What Year? 0 points  What month? 0 points  What time? 0 points  Count back from 20 0 points  Months in reverse 0 points  Repeat phrase 6 points  Total Score 6   (Normal:0-7, Significant for Dysfunction: >8)  Normal Cognitive Function Screening: Yes   Immunization & Health Maintenance Record Immunization History  Administered Date(s) Administered  . Influenza, High Dose Seasonal PF 03/11/2018  . Influenza,inj,Quad PF,6+ Mos 03/18/2016, 03/20/2017  . Pneumococcal Conjugate-13 04/21/2016  . Pneumococcal Polysaccharide-23 08/14/2017, 01/03/2018  . Zoster Recombinat (Shingrix) 01/03/2018, 03/11/2018    Health Maintenance  Topic Date Due  . Hepatitis C Screening  03-01-1946  . TETANUS/TDAP  04/12/1966  . DEXA SCAN  04/12/2012  . OPHTHALMOLOGY EXAM  06/26/2017  . MAMMOGRAM  01/31/2018  . HEMOGLOBIN A1C  01/05/2019  . FOOT EXAM  07/08/2019  . COLONOSCOPY  08/14/2026  . PNA vac Low Risk Adult  Completed  . INFLUENZA VACCINE  Discontinued       Assessment  This is a routine wellness examination for Erica Crosby.  Health Maintenance: Due or Overdue Health Maintenance Due  Topic Date Due  . Hepatitis C Screening  23-Mar-1946  . TETANUS/TDAP  04/12/1966  . DEXA SCAN  04/12/2012  . OPHTHALMOLOGY EXAM  06/26/2017  . MAMMOGRAM  01/31/2018  . HEMOGLOBIN A1C  01/05/2019    Donnajean Lopes Egnor does not need a referral for Community Assistance: Care Management:   no Social Work:    no Prescription Assistance:  no Nutrition/Diabetes Education:  no   Plan:  Personalized Goals Goals Addressed            This Visit's Progress   . DIET - INCREASE WATER INTAKE       Try to drink 6-8 glasses of water daily.      Personalized Health Maintenance & Screening Recommendations  Influenza vaccine Td vaccine Screening mammography  Lung Cancer Screening Recommended: no (Low Dose CT Chest  recommended if Age 53-80 years, 30 pack-year currently smoking OR have quit w/in past 15 years) Hepatitis C Screening recommended: no HIV Screening recommended: no  Advanced Directives: Written information was not prepared per patient's request.  Referrals & Orders No orders of the defined types were placed in this encounter.   Follow-up Plan . Follow-up with [name], DO as planned . Schedule your screening Mammogram as discussed . Consider Flu and TDAP vaccines at your next visit with your PCP   I have personally reviewed and noted the following in the patient's chart:   . Medical and social history . Use of alcohol, tobacco or illicit drugs  . Current medications and supplements . Functional ability and status . Nutritional status . Physical activity . Advanced directives . List of other physicians . Hospitalizations, surgeries, and ER visits in previous 12 months . Vitals . Screenings to include cognitive, depression, and falls . Referrals and appointments  In addition, I have reviewed and discussed with Erica Crosby certain preventive protocols, quality metrics, and best practice recommendations. A written personalized  care plan for preventive services as well as general preventive health recommendations is available and can be mailed to the patient at her request.      Marylin Crosby, LPN  [id number]/50/7225    I have reviewed and agree with the above AWV documentation.   Evelina Dun, FNP

## 2019-02-25 ENCOUNTER — Other Ambulatory Visit: Payer: Self-pay | Admitting: Family Medicine

## 2019-03-03 ENCOUNTER — Other Ambulatory Visit: Payer: Self-pay | Admitting: Family Medicine

## 2019-03-09 ENCOUNTER — Other Ambulatory Visit: Payer: Self-pay | Admitting: Family Medicine

## 2019-03-10 ENCOUNTER — Other Ambulatory Visit: Payer: Self-pay

## 2019-03-11 ENCOUNTER — Encounter: Payer: Self-pay | Admitting: Family Medicine

## 2019-03-11 ENCOUNTER — Other Ambulatory Visit: Payer: Self-pay

## 2019-03-11 ENCOUNTER — Ambulatory Visit (INDEPENDENT_AMBULATORY_CARE_PROVIDER_SITE_OTHER): Payer: Medicare Other | Admitting: Family Medicine

## 2019-03-11 VITALS — BP 163/73 | HR 98 | Temp 98.0°F | Ht 67.0 in | Wt 131.0 lb

## 2019-03-11 DIAGNOSIS — Z794 Long term (current) use of insulin: Secondary | ICD-10-CM

## 2019-03-11 DIAGNOSIS — E1165 Type 2 diabetes mellitus with hyperglycemia: Secondary | ICD-10-CM

## 2019-03-11 DIAGNOSIS — Z9114 Patient's other noncompliance with medication regimen: Secondary | ICD-10-CM

## 2019-03-11 DIAGNOSIS — IMO0002 Reserved for concepts with insufficient information to code with codable children: Secondary | ICD-10-CM

## 2019-03-11 DIAGNOSIS — E1121 Type 2 diabetes mellitus with diabetic nephropathy: Secondary | ICD-10-CM

## 2019-03-11 DIAGNOSIS — E785 Hyperlipidemia, unspecified: Secondary | ICD-10-CM

## 2019-03-11 DIAGNOSIS — N183 Chronic kidney disease, stage 3 (moderate): Secondary | ICD-10-CM | POA: Diagnosis not present

## 2019-03-11 DIAGNOSIS — E1122 Type 2 diabetes mellitus with diabetic chronic kidney disease: Secondary | ICD-10-CM

## 2019-03-11 DIAGNOSIS — E1169 Type 2 diabetes mellitus with other specified complication: Secondary | ICD-10-CM | POA: Diagnosis not present

## 2019-03-11 DIAGNOSIS — I129 Hypertensive chronic kidney disease with stage 1 through stage 4 chronic kidney disease, or unspecified chronic kidney disease: Secondary | ICD-10-CM

## 2019-03-11 LAB — BAYER DCA HB A1C WAIVED: HB A1C (BAYER DCA - WAIVED): 8.3 % — ABNORMAL HIGH (ref <7.0)

## 2019-03-11 NOTE — Patient Instructions (Signed)
Your sugar has gotten better since your check in January.  I think that it would get to goal of A1c less than 7 if you were able to remember to take you insulin every day.   I'd like you to try and do the injections earlier in the day if that makes it easier for you to remember.  As long as you do them around the same time every day you should not have any problems.  With regards to blood sugar monitoring, you should at least be checking every day when you wake up and every day before you go to bed.  Try and keep a good sugar log and we will recheck your sugar level and review your log in 3 months.  Remember to take you blood pressure medication before you visit.  You had labs performed today.  You will be contacted with the results of the labs once they are available, usually in the next 3 business days for routine lab work.  If you have an active my chart account, they will be released to your MyChart.  If you prefer to have these labs released to you via telephone, please let us know.  If you had a pap smear or biopsy performed, expect to be contacted in about 7-10 days.

## 2019-03-11 NOTE — Progress Notes (Signed)
Subjective: CC:DM2/ HTN2 PCP: [name], DO HPI:Erica Crosby is a 73 y.o. female presenting to clinic today for:  1. Type 2 Diabetes w/ HTN and HLD  Patient reports that she monitors her blood sugar maybe once daily.  She is prescribed Lantus 30u, Lipitor 10 and Enalapril 49m and Coreg 12.5 (has not taken her BP meds today).  However, she goes on to state that she often forgets to take her Lantus most days of the week.  She is scheduled to take them in the evening time but will often go to sleep and forget to take the insulin.  [facility] [place] [facility]: Up-to-date Last A1c:  Lab Results  Component Value Date   HGBA1C 11.5 (H) 07/07/2018  Nephropathy screen indicated?: on ACE-I Last flu, zoster and/or pneumovax: declines  ROS: Denies chest pain, shortness of breath, falls.  She no longer sees Dr. NDorris Fetchfor unknown reason but goes on to state that she is interested in both the freestyle libre and possibly an insulin pump so that she does not have to inject herself.   ROS: Per HPI  No Known Allergies Past Medical History:  Diagnosis Date  . Arthritis   . Cataract   . Coronary artery disease    Mild plaque 2012  . Diabetes mellitus   . Essential hypertension 02/10/2012  . GERD (gastroesophageal reflux disease)   . Hyperlipidemia   . Hypertension   . Neuropathy   . Peptic ulcer   . [place]umatoid [place] ([place]   . TIA (transient ischemic attack) 02/10/2012    Current Outpatient Medications:  .  Adalimumab (HUMIRA PEN [place]), Inject into the skin every 14 (fourteen) days., Disp: , Rfl:  .  alendronate (FOSAMAX) 70 MG tablet, Take 1 tablet by mouth once a week, Disp: 12 tablet, Rfl: 0 .  aspirin EC 81 MG tablet, Take 81 mg by mouth daily., Disp: , Rfl:  .  atorvastatin (LIPITOR) 10 MG tablet, Take 1 tablet (10 mg total) by mouth daily., Disp: 90 tablet, Rfl: 3 .  blood glucose meter kit and supplies KIT, Dispense based on patient and insurance  preference. Use up to four times daily as directed. (E11.21/E11.65)., Disp: 1 each, Rfl: 0 .  carvedilol (COREG) 12.5 MG tablet, Take 1 tablet (12.5 mg total) by mouth 2 (two) times daily with a meal., Disp: , Rfl:  .  enalapril (VASOTEC) 20 MG tablet, Take 2 tablets by mouth once daily, Disp: 180 tablet, Rfl: 0 .  folic acid (FOLVITE) 1 MG tablet, Take 1 tablet by mouth once daily, Disp: 90 tablet, Rfl: 0 .  gabapentin (NEURONTIN) 400 MG capsule, Take 1 capsule by mouth at bedtime, Disp: 90 capsule, Rfl: 0 .  glucose blood (ACCU-CHEK AVIVA) test strip, Test up to 3 times daily.  E11.21/E11.65, Disp: 100 each, Rfl: 12 .  Insulin Glargine (LANTUS SOLOSTAR) 100 UNIT/ML Solostar Pen, INJECT 30 UNITS INTO THE SKIN AT BEDTIME, Disp: 15 mL, Rfl: 2 .  Lancets MISC, Test up to 3 times daily E11.21/E11.65, Disp: 100 each, Rfl: 12 .  leflunomide (ARAVA) 20 MG tablet, Take 1 tablet by mouth daily., Disp: , Rfl:  .  Omega-3 Fatty Acids (FISH OIL) 1000 MG CAPS, Take 1 capsule by mouth daily. , Disp: , Rfl:  .  pantoprazole (PROTONIX) 40 MG tablet, Take 1 tablet by mouth once daily, Disp: 90 tablet, Rfl: 0 Social History   Socioeconomic History  . Marital status: Widowed  Spouse name: Not on file  . Number of children: 3  . Years of education: 33  . Highest education level: High school graduate  Occupational History  . Not on file  Social Needs  . Financial resource strain: Not hard at all  . Food insecurity    Worry: Never true    Inability: Never true  . Transportation needs    Medical: No    Non-medical: No  Tobacco Use  . Smoking status: Former Smoker    Years: 25.00    Types: Cigarettes, E-cigarettes    Quit date: 02/02/2013    Years since quitting: 6.1  . Smokeless tobacco: Never Used  Substance and Sexual Activity  . Alcohol use: No  . Drug use: No  . Sexual activity: Not Currently    Birth control/protection: Post-menopausal  Lifestyle  . Physical activity    Days per week: 0  days    Minutes per session: 0 min  . Stress: Only a little  Relationships  . Social Herbalist on phone: Three times a week    Gets together: More than three times a week    Attends religious service: Never    Active member of club or organization: No    Attends meetings of clubs or organizations: Never    Relationship status: Widowed  . Intimate partner violence    Fear of current or ex partner: No    Emotionally abused: No    Physically abused: No    Forced sexual activity: No  Other Topics Concern  . Not on file  Social History Narrative   Daughter stays with her.    Family History  Problem Relation Age of Onset  . Heart disease Sister        Congeital (died age 26) with "enlarged heart"  . CAD Sister   . Diabetes Mother   . Heart disease Mother        Later onset heart disease   . Dementia Mother   . Alcohol abuse Father   . Liver disease Father   . CAD Sister 29       CABG  . Early death Sister   . Diabetes Brother   . Kidney disease Brother        related to DM  . Diabetes Brother     Objective: Office vital signs reviewed. BP (!) 163/73   Pulse 98   Temp 98 F (36.7 C) (Temporal)   Ht 5' 7" (1.702 m)   Wt 131 lb (59.4 kg)   SpO2 96%   BMI 20.52 kg/m   Physical Examination:  General: Awake, alert, No acute distress HEENT: Normal, sclera white Cardio: regular rate and rhythm, S1S2 heard, no murmurs appreciated Pulm: clear to auscultation bilaterally, no wheezes, rhonchi or rales; normal work of breathing on room air Extremities: warm, well perfused, No edema, cyanosis or clubbing; +2 pulses bilaterally MSK: uses cane for ambulation   Assessment/ Plan: 73 y.o. female   1. Uncontrolled type 2 diabetes mellitus with diabetic nephropathy, with long-term current use of insulin ([place]) She is had a 15 pound weight loss since her last visit in January and I wonder if this is related to uncontrolled diabetes.  Since she is essentially been lost  to follow-up I am uncertain as to how quickly this is occurred we may need to consider further work-up of this though she had no specific complaints today.  If she continues to downtrend by next visit, we  will need to consider further evaluation for possible malignant etiology.  We discussed strategies to help her remember her insulin including moving the insulin timing to lunchtime rather than the evening time. - Bayer DCA Hb A1c Waived - CMP14+EGFR - Lipid Panel  2. Hyperlipidemia associated with type 2 diabetes mellitus ([facility]) - CMP14+EGFR - Lipid Panel  3. 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  I reiterated the fact that she needs to take blood pressure medicine prior to arrival to the office as it is hard to determine whether or not her pressures are under control without her being compliant with it prior to the office visit.  She voiced good understanding. - CMP14+EGFR - CBC - VITAMIN D 25 Hydroxy (Vit-D Deficiency, Fractures)  4. Noncompliance with medication regimen May need to consider referral to CCM for assistance with this patient.  She may also be a good THN candidate.   No orders of the defined types were placed in this encounter.  No orders of the defined types were placed in this encounter.    [name], DO [place] [phone]

## 2019-03-12 LAB — CMP14+EGFR
ALT: 6 IU/L (ref 0–32)
AST: 10 IU/L (ref 0–40)
Albumin/Globulin Ratio: 1.2 (ref 1.2–2.2)
Albumin: 3.3 g/dL — ABNORMAL LOW (ref 3.7–4.7)
Alkaline Phosphatase: 88 IU/L (ref 39–117)
BUN/Creatinine Ratio: 12 (ref 12–28)
BUN: 13 mg/dL (ref 8–27)
Bilirubin Total: 0.6 mg/dL (ref 0.0–1.2)
CO2: 26 mmol/L (ref 20–29)
Calcium: 8.5 mg/dL — ABNORMAL LOW (ref 8.7–10.3)
Chloride: 104 mmol/L (ref 96–106)
Creatinine, Ser: 1.13 mg/dL — ABNORMAL HIGH (ref 0.57–1.00)
GFR calc Af Amer: 57 mL/min/{1.73_m2} — ABNORMAL LOW (ref >59)
GFR calc non Af Amer: 49 mL/min/{1.73_m2} — ABNORMAL LOW (ref >59)
Globulin, Total: 2.8 g/dL (ref 1.5–4.5)
Glucose: 154 mg/dL — ABNORMAL HIGH (ref 65–99)
Potassium: 3.1 mmol/L — ABNORMAL LOW (ref 3.5–5.2)
Sodium: 141 mmol/L (ref 134–144)
Total Protein: 6.1 g/dL (ref 6.0–8.5)

## 2019-03-12 LAB — LIPID PANEL
Chol/HDL Ratio: 2.8 ratio (ref 0.0–4.4)
Cholesterol, Total: 138 mg/dL (ref 100–199)
HDL: 50 mg/dL (ref >39)
LDL Chol Calc (NIH): 71 mg/dL (ref 0–99)
Triglycerides: 89 mg/dL (ref 0–149)
VLDL Cholesterol Cal: 17 mg/dL (ref 5–40)

## 2019-03-12 LAB — VITAMIN D 25 HYDROXY (VIT D DEFICIENCY, FRACTURES): Vit D, 25-Hydroxy: 24.9 ng/mL — ABNORMAL LOW (ref 30.0–100.0)

## 2019-03-12 LAB — CBC
Hematocrit: 34.1 % (ref 34.0–46.6)
Hemoglobin: 10.8 g/dL — ABNORMAL LOW (ref 11.1–15.9)
MCH: 27.2 pg (ref 26.6–33.0)
MCHC: 31.7 g/dL (ref 31.5–35.7)
MCV: 86 fL (ref 79–97)
Platelets: 225 10*3/uL (ref 150–450)
RBC: 3.97 x10E6/uL (ref 3.77–5.28)
RDW: 16.5 % — ABNORMAL HIGH (ref 11.7–15.4)
WBC: 5.9 10*3/uL (ref 3.4–10.8)

## 2019-03-13 ENCOUNTER — Other Ambulatory Visit: Payer: Self-pay | Admitting: Family Medicine

## 2019-03-13 DIAGNOSIS — E876 Hypokalemia: Secondary | ICD-10-CM

## 2019-03-13 DIAGNOSIS — E559 Vitamin D deficiency, unspecified: Secondary | ICD-10-CM

## 2019-03-13 MED ORDER — CHOLECALCIFEROL 1.25 MG (50000 UT) PO CAPS: 50000.0000 [IU] | ORAL_CAPSULE | ORAL | 0 refills | Status: DC

## 2019-03-13 MED ORDER — POTASSIUM CHLORIDE CRYS ER 20 MEQ PO TBCR: 20.0000 meq | EXTENDED_RELEASE_TABLET | Freq: Every day | ORAL | 0 refills | Status: DC

## 2019-03-15 ENCOUNTER — Other Ambulatory Visit: Payer: Self-pay | Admitting: Family Medicine

## 2019-04-21 ENCOUNTER — Other Ambulatory Visit: Payer: Self-pay | Admitting: Family Medicine

## 2019-05-21 ENCOUNTER — Emergency Department (HOSPITAL_COMMUNITY): Payer: Medicare Other

## 2019-05-21 ENCOUNTER — Other Ambulatory Visit: Payer: Self-pay

## 2019-05-21 ENCOUNTER — Inpatient Hospital Stay (HOSPITAL_COMMUNITY)
Admission: EM | Admit: 2019-05-21 | Discharge: 2019-05-25 | DRG: 871 | Disposition: A | Discharge status: Home / Self Care | Payer: Medicare Other | Attending: Internal Medicine | Admitting: Internal Medicine

## 2019-05-21 ENCOUNTER — Encounter (HOSPITAL_COMMUNITY): Payer: Self-pay

## 2019-05-21 DIAGNOSIS — I4510 Unspecified right bundle-branch block: Secondary | ICD-10-CM | POA: Diagnosis not present

## 2019-05-21 DIAGNOSIS — E785 Hyperlipidemia, unspecified: Secondary | ICD-10-CM | POA: Diagnosis not present

## 2019-05-21 DIAGNOSIS — N1832 Chronic kidney disease, stage 3b: Secondary | ICD-10-CM | POA: Diagnosis present

## 2019-05-21 DIAGNOSIS — J1289 Other viral pneumonia: Secondary | ICD-10-CM | POA: Diagnosis not present

## 2019-05-21 DIAGNOSIS — A419 Sepsis, unspecified organism: Secondary | ICD-10-CM | POA: Diagnosis not present

## 2019-05-21 DIAGNOSIS — R404 Transient alteration of awareness: Secondary | ICD-10-CM | POA: Diagnosis not present

## 2019-05-21 DIAGNOSIS — K219 Gastro-esophageal reflux disease without esophagitis: Secondary | ICD-10-CM | POA: Diagnosis present

## 2019-05-21 DIAGNOSIS — R0689 Other abnormalities of breathing: Secondary | ICD-10-CM | POA: Diagnosis not present

## 2019-05-21 DIAGNOSIS — R6520 Severe sepsis without septic shock: Secondary | ICD-10-CM | POA: Diagnosis not present

## 2019-05-21 DIAGNOSIS — E1165 Type 2 diabetes mellitus with hyperglycemia: Secondary | ICD-10-CM | POA: Diagnosis present

## 2019-05-21 DIAGNOSIS — I2510 Atherosclerotic heart disease of native coronary artery without angina pectoris: Secondary | ICD-10-CM | POA: Diagnosis not present

## 2019-05-21 DIAGNOSIS — G9341 Metabolic encephalopathy: Secondary | ICD-10-CM | POA: Diagnosis not present

## 2019-05-21 DIAGNOSIS — I248 Other forms of acute ischemic heart disease: Secondary | ICD-10-CM | POA: Diagnosis present

## 2019-05-21 DIAGNOSIS — E1140 Type 2 diabetes mellitus with diabetic neuropathy, unspecified: Secondary | ICD-10-CM | POA: Diagnosis present

## 2019-05-21 DIAGNOSIS — Z8249 Family history of ischemic heart disease and other diseases of the circulatory system: Secondary | ICD-10-CM

## 2019-05-21 DIAGNOSIS — I10 Essential (primary) hypertension: Secondary | ICD-10-CM | POA: Diagnosis not present

## 2019-05-21 DIAGNOSIS — Z7982 Long term (current) use of aspirin: Secondary | ICD-10-CM

## 2019-05-21 DIAGNOSIS — J1282 Pneumonia due to coronavirus disease 2019: Secondary | ICD-10-CM | POA: Diagnosis present

## 2019-05-21 DIAGNOSIS — E1369 Other specified diabetes mellitus with other specified complication: Secondary | ICD-10-CM | POA: Diagnosis not present

## 2019-05-21 DIAGNOSIS — J90 Pleural effusion, not elsewhere classified: Secondary | ICD-10-CM | POA: Diagnosis not present

## 2019-05-21 DIAGNOSIS — J9601 Acute respiratory failure with hypoxia: Secondary | ICD-10-CM | POA: Diagnosis present

## 2019-05-21 DIAGNOSIS — IMO0002 Reserved for concepts with insufficient information to code with codable children: Secondary | ICD-10-CM

## 2019-05-21 DIAGNOSIS — E876 Hypokalemia: Secondary | ICD-10-CM | POA: Diagnosis not present

## 2019-05-21 DIAGNOSIS — R778 Other specified abnormalities of plasma proteins: Secondary | ICD-10-CM | POA: Diagnosis not present

## 2019-05-21 DIAGNOSIS — Z833 Family history of diabetes mellitus: Secondary | ICD-10-CM

## 2019-05-21 DIAGNOSIS — Z79899 Other long term (current) drug therapy: Secondary | ICD-10-CM

## 2019-05-21 DIAGNOSIS — Z794 Long term (current) use of insulin: Secondary | ICD-10-CM | POA: Diagnosis not present

## 2019-05-21 DIAGNOSIS — Z8711 Personal history of peptic ulcer disease: Secondary | ICD-10-CM

## 2019-05-21 DIAGNOSIS — R0602 Shortness of breath: Secondary | ICD-10-CM | POA: Diagnosis not present

## 2019-05-21 DIAGNOSIS — M1990 Unspecified osteoarthritis, unspecified site: Secondary | ICD-10-CM | POA: Diagnosis not present

## 2019-05-21 DIAGNOSIS — E1122 Type 2 diabetes mellitus with diabetic chronic kidney disease: Secondary | ICD-10-CM | POA: Diagnosis not present

## 2019-05-21 DIAGNOSIS — M069 Rheumatoid arthritis, unspecified: Secondary | ICD-10-CM | POA: Diagnosis not present

## 2019-05-21 DIAGNOSIS — A4189 Other specified sepsis: Secondary | ICD-10-CM | POA: Diagnosis not present

## 2019-05-21 DIAGNOSIS — U071 COVID-19: Secondary | ICD-10-CM | POA: Diagnosis present

## 2019-05-21 DIAGNOSIS — I340 Nonrheumatic mitral (valve) insufficiency: Secondary | ICD-10-CM | POA: Diagnosis not present

## 2019-05-21 DIAGNOSIS — E860 Dehydration: Secondary | ICD-10-CM | POA: Diagnosis not present

## 2019-05-21 DIAGNOSIS — I5032 Chronic diastolic (congestive) heart failure: Secondary | ICD-10-CM | POA: Diagnosis present

## 2019-05-21 DIAGNOSIS — R0902 Hypoxemia: Secondary | ICD-10-CM | POA: Diagnosis not present

## 2019-05-21 DIAGNOSIS — Z8673 Personal history of transient ischemic attack (TIA), and cerebral infarction without residual deficits: Secondary | ICD-10-CM

## 2019-05-21 DIAGNOSIS — R4182 Altered mental status, unspecified: Secondary | ICD-10-CM

## 2019-05-21 DIAGNOSIS — E1121 Type 2 diabetes mellitus with diabetic nephropathy: Secondary | ICD-10-CM | POA: Diagnosis not present

## 2019-05-21 DIAGNOSIS — Z87891 Personal history of nicotine dependence: Secondary | ICD-10-CM

## 2019-05-21 DIAGNOSIS — Z841 Family history of disorders of kidney and ureter: Secondary | ICD-10-CM

## 2019-05-21 LAB — C-REACTIVE PROTEIN: CRP: 1.7 mg/dL — ABNORMAL HIGH (ref <1.0)

## 2019-05-21 LAB — CBC WITH DIFFERENTIAL/PLATELET
Abs Immature Granulocytes: 0.01 10*3/uL (ref 0.00–0.07)
Basophils Absolute: 0 10*3/uL (ref 0.0–0.1)
Basophils Relative: 1 %
Eosinophils Absolute: 0 10*3/uL (ref 0.0–0.5)
Eosinophils Relative: 0 %
HCT: 36.7 % (ref 36.0–46.0)
Hemoglobin: 11.3 g/dL — ABNORMAL LOW (ref 12.0–15.0)
Immature Granulocytes: 0 %
Lymphocytes Relative: 34 %
Lymphs Abs: 0.9 10*3/uL (ref 0.7–4.0)
MCH: 26 pg (ref 26.0–34.0)
MCHC: 30.8 g/dL (ref 30.0–36.0)
MCV: 84.4 fL (ref 80.0–100.0)
Monocytes Absolute: 0.3 10*3/uL (ref 0.1–1.0)
Monocytes Relative: 11 %
Neutro Abs: 1.4 10*3/uL — ABNORMAL LOW (ref 1.7–7.7)
Neutrophils Relative %: 54 %
Platelets: 178 10*3/uL (ref 150–400)
RBC: 4.35 MIL/uL (ref 3.87–5.11)
RDW: 15.9 % — ABNORMAL HIGH (ref 11.5–15.5)
WBC: 2.6 10*3/uL — ABNORMAL LOW (ref 4.0–10.5)
nRBC: 0 % (ref 0.0–0.2)

## 2019-05-21 LAB — URINALYSIS, ROUTINE W REFLEX MICROSCOPIC
Bacteria, UA: NONE SEEN
Bilirubin Urine: NEGATIVE
Glucose, UA: 50 mg/dL — AB
Ketones, ur: 5 mg/dL — AB
Leukocytes,Ua: NEGATIVE
Nitrite: NEGATIVE
Protein, ur: >=300 mg/dL — AB
Specific Gravity, Urine: 1.011 (ref 1.005–1.030)
pH: 6 (ref 5.0–8.0)

## 2019-05-21 LAB — D-DIMER, QUANTITATIVE: D-Dimer, Quant: 4.85 ug/mL-FEU — ABNORMAL HIGH (ref 0.00–0.50)

## 2019-05-21 LAB — PROCALCITONIN: Procalcitonin: <0.1 ng/mL

## 2019-05-21 LAB — COMPREHENSIVE METABOLIC PANEL
ALT: 14 U/L (ref 0–44)
AST: 34 U/L (ref 15–41)
Albumin: 2.9 g/dL — ABNORMAL LOW (ref 3.5–5.0)
Alkaline Phosphatase: 67 U/L (ref 38–126)
Anion gap: 11 (ref 5–15)
BUN: 13 mg/dL (ref 8–23)
CO2: 27 mmol/L (ref 22–32)
Calcium: 8.2 mg/dL — ABNORMAL LOW (ref 8.9–10.3)
Chloride: 101 mmol/L (ref 98–111)
Creatinine, Ser: 1.43 mg/dL — ABNORMAL HIGH (ref 0.44–1.00)
GFR calc Af Amer: 42 mL/min — ABNORMAL LOW (ref >60)
GFR calc non Af Amer: 36 mL/min — ABNORMAL LOW (ref >60)
Glucose, Bld: 244 mg/dL — ABNORMAL HIGH (ref 70–99)
Potassium: 2.5 mmol/L — CL (ref 3.5–5.1)
Sodium: 139 mmol/L (ref 135–145)
Total Bilirubin: 0.8 mg/dL (ref 0.3–1.2)
Total Protein: 6.7 g/dL (ref 6.5–8.1)

## 2019-05-21 LAB — POC SARS CORONAVIRUS 2 AG -  ED: SARS Coronavirus 2 Ag: POSITIVE — AB

## 2019-05-21 LAB — TROPONIN I (HIGH SENSITIVITY)
Troponin I (High Sensitivity): 192 ng/L (ref <18)
Troponin I (High Sensitivity): 181 ng/L (ref <18)

## 2019-05-21 LAB — LACTATE DEHYDROGENASE: LDH: 266 U/L — ABNORMAL HIGH (ref 98–192)

## 2019-05-21 LAB — FERRITIN: Ferritin: 122 ng/mL (ref 11–307)

## 2019-05-21 LAB — MAGNESIUM: Magnesium: 1.6 mg/dL — ABNORMAL LOW (ref 1.7–2.4)

## 2019-05-21 LAB — GLUCOSE, CAPILLARY: Glucose-Capillary: 133 mg/dL — ABNORMAL HIGH (ref 70–99)

## 2019-05-21 LAB — LACTIC ACID, PLASMA: Lactic Acid, Venous: 1.9 mmol/L (ref 0.5–1.9)

## 2019-05-21 LAB — PROTIME-INR
INR: 1.2 (ref 0.8–1.2)
Prothrombin Time: 15 seconds (ref 11.4–15.2)

## 2019-05-21 LAB — FIBRINOGEN: Fibrinogen: 477 mg/dL — ABNORMAL HIGH (ref 210–475)

## 2019-05-21 LAB — BRAIN NATRIURETIC PEPTIDE: B Natriuretic Peptide: 974 pg/mL — ABNORMAL HIGH (ref 0.0–100.0)

## 2019-05-21 LAB — TRIGLYCERIDES: Triglycerides: 101 mg/dL (ref <150)

## 2019-05-21 LAB — APTT: aPTT: 37 seconds — ABNORMAL HIGH (ref 24–36)

## 2019-05-21 MED ORDER — POTASSIUM CHLORIDE 10 MEQ/100ML IV SOLN
10.0000 meq | Freq: Once | INTRAVENOUS | Status: AC
  Administered 2019-05-21: 10 meq via INTRAVENOUS
  Filled 2019-05-21: qty 100

## 2019-05-21 MED ORDER — INSULIN ASPART 100 UNIT/ML ~~LOC~~ SOLN
0.0000 [IU] | Freq: Three times a day (TID) | SUBCUTANEOUS | Status: DC
  Administered 2019-05-22: 1 [IU] via SUBCUTANEOUS
  Administered 2019-05-22: 09:00:00 2 [IU] via SUBCUTANEOUS
  Administered 2019-05-24 (×2): 1 [IU] via SUBCUTANEOUS

## 2019-05-21 MED ORDER — MAGNESIUM SULFATE 2 GM/50ML IV SOLN
2.0000 g | Freq: Once | INTRAVENOUS | Status: AC
  Administered 2019-05-21: 2 g via INTRAVENOUS
  Filled 2019-05-21: qty 50

## 2019-05-21 MED ORDER — SODIUM CHLORIDE 0.9% FLUSH
3.0000 mL | Freq: Two times a day (BID) | INTRAVENOUS | Status: DC
  Administered 2019-05-23 – 2019-05-24 (×4): 3 mL via INTRAVENOUS

## 2019-05-21 MED ORDER — ACETAMINOPHEN 650 MG RE SUPP: 650.0000 mg | Freq: Four times a day (QID) | RECTAL | Status: DC | PRN

## 2019-05-21 MED ORDER — ATORVASTATIN CALCIUM 40 MG PO TABS
40.0000 mg | ORAL_TABLET | Freq: Every day | ORAL | Status: DC
  Administered 2019-05-21 – 2019-05-24 (×4): 40 mg via ORAL
  Filled 2019-05-21 (×4): qty 1

## 2019-05-21 MED ORDER — HEPARIN (PORCINE) 25000 UT/250ML-% IV SOLN
500.0000 [IU]/h | INTRAVENOUS | Status: DC
  Administered 2019-05-21: 800 [IU]/h via INTRAVENOUS
  Filled 2019-05-21: qty 250

## 2019-05-21 MED ORDER — ASPIRIN 325 MG PO TABS
325.0000 mg | ORAL_TABLET | Freq: Once | ORAL | Status: AC
  Administered 2019-05-21: 325 mg via ORAL
  Filled 2019-05-21: qty 1

## 2019-05-21 MED ORDER — POTASSIUM CHLORIDE CRYS ER 20 MEQ PO TBCR
40.0000 meq | EXTENDED_RELEASE_TABLET | Freq: Once | ORAL | Status: AC
  Administered 2019-05-21: 40 meq via ORAL
  Filled 2019-05-21: qty 2

## 2019-05-21 MED ORDER — ACETAMINOPHEN 325 MG PO TABS
650.0000 mg | ORAL_TABLET | Freq: Four times a day (QID) | ORAL | Status: DC | PRN
  Administered 2019-05-24: 650 mg via ORAL
  Filled 2019-05-21: qty 2

## 2019-05-21 MED ORDER — FUROSEMIDE 10 MG/ML IJ SOLN
40.0000 mg | Freq: Once | INTRAMUSCULAR | Status: AC
  Administered 2019-05-21: 16:00:00 40 mg via INTRAVENOUS
  Filled 2019-05-21: qty 4

## 2019-05-21 MED ORDER — INSULIN GLARGINE 100 UNIT/ML ~~LOC~~ SOLN
10.0000 [IU] | Freq: Every day | SUBCUTANEOUS | Status: DC
  Administered 2019-05-21: 10 [IU] via SUBCUTANEOUS
  Filled 2019-05-21 (×4): qty 0.1

## 2019-05-21 MED ORDER — CARVEDILOL 12.5 MG PO TABS
12.5000 mg | ORAL_TABLET | Freq: Two times a day (BID) | ORAL | Status: DC
  Administered 2019-05-21 – 2019-05-25 (×8): 12.5 mg via ORAL
  Filled 2019-05-21 (×8): qty 1

## 2019-05-21 MED ORDER — SODIUM CHLORIDE 0.9 % IV BOLUS: 1000.0000 mL | Freq: Once | INTRAVENOUS | Status: DC

## 2019-05-21 MED ORDER — DEXAMETHASONE 4 MG PO TABS
6.0000 mg | ORAL_TABLET | Freq: Every day | ORAL | Status: DC
  Administered 2019-05-21 – 2019-05-25 (×5): 6 mg via ORAL
  Filled 2019-05-21 (×6): qty 2

## 2019-05-21 MED ORDER — SODIUM CHLORIDE 0.9 % IV SOLN
100.0000 mg | Freq: Every day | INTRAVENOUS | Status: DC
  Filled 2019-05-21 (×4): qty 20

## 2019-05-21 MED ORDER — FOLIC ACID 1 MG PO TABS
1.0000 mg | ORAL_TABLET | Freq: Every day | ORAL | Status: DC
  Administered 2019-05-22 – 2019-05-25 (×4): 1 mg via ORAL
  Filled 2019-05-21 (×4): qty 1

## 2019-05-21 MED ORDER — ASPIRIN EC 81 MG PO TBEC
81.0000 mg | DELAYED_RELEASE_TABLET | Freq: Every day | ORAL | Status: DC
  Administered 2019-05-22 – 2019-05-25 (×4): 81 mg via ORAL
  Filled 2019-05-21 (×4): qty 1

## 2019-05-21 MED ORDER — SODIUM CHLORIDE 0.9 % IV SOLN
200.0000 mg | Freq: Once | INTRAVENOUS | Status: AC
  Administered 2019-05-21: 200 mg via INTRAVENOUS
  Filled 2019-05-21: qty 40

## 2019-05-21 MED ORDER — LEFLUNOMIDE 10 MG PO TABS
20.0000 mg | ORAL_TABLET | Freq: Every day | ORAL | Status: DC
  Filled 2019-05-21 (×4): qty 1

## 2019-05-21 MED ORDER — ENALAPRIL MALEATE 5 MG PO TABS
40.0000 mg | ORAL_TABLET | Freq: Every day | ORAL | Status: DC
  Administered 2019-05-22 – 2019-05-25 (×4): 40 mg via ORAL
  Filled 2019-05-21 (×4): qty 8

## 2019-05-21 NOTE — ED Provider Notes (Addendum)
[facility] EMERGENCY DEPARTMENT Provider Note   CSN: [id number] Arrival date & time: 05/21/19  1509     History   Chief Complaint Chief Complaint  Patient presents with  . Altered Mental Status    HPI Erica Crosby is a 73 y.o. female.     Pt presents to the ED today with with [facility].  The pt lives with her family and family noticed she was not acting right.  The pt is not giving much hx.  1 episode of diarrhea today.  No known covid exposures.  Per EMS, pt has not taken any of her meds today.  Pt will only answer "uhhuh."     Past Medical History:  Diagnosis Date  . Arthritis   . Cataract   . Coronary artery disease    Mild plaque 2012  . Diabetes mellitus   . Essential hypertension 02/10/2012  . GERD (gastroesophageal reflux disease)   . Hyperlipidemia   . Hypertension   . Neuropathy   . Peptic ulcer   . Rheumatoid arthritis ([place])   . TIA (transient ischemic attack) 02/10/2012    Patient Active Problem List   Diagnosis Date Noted  . Hyperlipidemia associated with type 2 diabetes mellitus ([place]) 07/07/2018  . Noncompliance with medication regimen 07/07/2018  . Palpitations 05/07/2018  . Chest pain 05/07/2018  . RA (rheumatoid arthritis) ([place]) 03/20/2017  . Mixed hyperlipidemia 02/13/2017  . 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 Personal history of noncompliance with medical treatment, presenting hazards to health 12/24/2016  . Bulging lumbar disc 08/22/2016  . Syncope 02/10/2012  . Ataxia 02/10/2012  . Uncontrolled type 2 diabetes mellitus with diabetic nephropathy, with long-term current use of insulin ([place]) 02/10/2012    Past Surgical History:  Procedure Laterality Date  . ABSCESS DRAINAGE    . CARPAL TUNNEL RELEASE Left   . CHOLECYSTECTOMY    . KNEE SURGERY Right   . NECK SURGERY     x 2  . SHOULDER SURGERY Left      OB History   No obstetric history on file.      Home  Medications    Prior to Admission medications   Medication Sig Start Date End Date Taking? Authorizing Provider  Adalimumab (HUMIRA PEN [place]) Inject into the skin every 14 (fourteen) days.   Yes [provider]  alendronate (FOSAMAX) 70 MG tablet Take 1 tablet by mouth once a week Patient taking differently: Take 70 mg by mouth once a week.  04/22/19  Yes Gottschalk, Leatrice Jewels M, DO  atorvastatin (LIPITOR) 10 MG tablet Take 1 tablet (10 mg total) by mouth daily. 07/07/18  Yes Ronnie Doss M, DO  carvedilol (COREG) 12.5 MG tablet Take 1 tablet (12.5 mg total) by mouth 2 (two) times daily with a meal. 07/07/18  Yes Gottschalk, Ashly M, DO  enalapril (VASOTEC) 20 MG tablet Take 2 tablets by mouth once daily Patient taking differently: Take 40 mg by mouth daily.  03/03/19  Yes Ronnie Doss M, DO  folic acid (FOLVITE) 1 MG tablet Take 1 tablet by mouth once daily Patient taking differently: Take 1 mg by mouth daily.  03/16/19  Yes Gottschalk, Leatrice Jewels M, DO  gabapentin (NEURONTIN) 400 MG capsule Take 1 capsule by mouth at bedtime Patient taking differently: Take 400 mg by mouth at bedtime.  02/25/19  Yes Gottschalk, Leatrice Jewels M, DO  leflunomide (ARAVA) 20 MG tablet Take 20 mg by mouth daily.  02/19/18  Yes [provider]  pantoprazole (PROTONIX) 40 MG tablet Take 1 tablet by mouth once daily Patient taking differently: Take 40 mg by mouth daily.  03/09/19  Yes Ronnie Doss M, DO  aspirin EC 81 MG tablet Take 81 mg by mouth daily.    [provider]  Insulin Glargine (LANTUS SOLOSTAR) 100 UNIT/ML Solostar Pen INJECT 30 UNITS INTO THE SKIN AT BEDTIME 07/07/18   Ronnie Doss M, DO  Omega-3 Fatty Acids (FISH OIL) 1000 MG CAPS Take 1 capsule by mouth daily.     [provider]  potassium chloride SA (K-DUR) 20 MEQ tablet Take 1 tablet (20 mEq total) by mouth daily for 3 days. 03/13/19 03/16/19  [name], DO    Family History Family History  Problem Relation  Age of Onset  . Heart disease Sister        [place] (died age 25) with "enlarged heart"  . CAD Sister   . Diabetes Mother   . Heart disease Mother        Later onset heart disease   . Dementia Mother   . Alcohol abuse Father   . Liver disease Father   . CAD Sister 80       CABG  . Early death Sister   . Diabetes Brother   . Kidney disease Brother        related to DM  . Diabetes Brother     Social History Social History   Tobacco Use  . Smoking status: Former Smoker    Years: 25.00    Types: Cigarettes, E-cigarettes    Quit date: 02/02/2013    Years since quitting: 6.2  . Smokeless tobacco: Never Used  Substance Use Topics  . Alcohol use: No  . Drug use: No     Allergies   Patient has no known allergies.   Review of Systems Review of Systems  Unable to perform ROS: Mental status change     Physical Exam Updated Vital Signs BP (!) 186/104   Pulse 100   Temp (!) (P) 97.3 F (36.3 C) (Rectal)   Resp 18   SpO2 91%   Physical Exam Vitals signs and nursing note reviewed.  Constitutional:      Appearance: Normal appearance.  HENT:     Head: Normocephalic and atraumatic.     Right Ear: External ear normal.     Left Ear: External ear normal.     Nose: Nose normal.     Mouth/Throat:     Mouth: Mucous membranes are dry.  Eyes:     Extraocular Movements: Extraocular movements intact.     Conjunctiva/sclera: Conjunctivae normal.     Pupils: Pupils are equal, round, and reactive to light.  Neck:     Musculoskeletal: Normal range of motion and neck supple.  Neurological:     Mental Status: She is alert.      ED Treatments / Results  Labs (all labs ordered are listed, but only abnormal results are displayed) Labs Reviewed  CBC WITH DIFFERENTIAL/PLATELET - Abnormal; Notable for the following components:      Result Value   WBC 2.6 (*)    Hemoglobin 11.3 (*)    RDW 15.9 (*)    Neutro Abs 1.4 (*)    All other components within normal limits   COMPREHENSIVE METABOLIC PANEL - Abnormal; Notable for the following components:   Potassium 2.5 (*)    Glucose, Bld 244 (*)    Creatinine, Ser 1.43 (*)  Calcium 8.2 (*)    Albumin 2.9 (*)    GFR calc non Af Amer 36 (*)    GFR calc Af Amer 42 (*)    All other components within normal limits  URINALYSIS, ROUTINE W REFLEX MICROSCOPIC - Abnormal; Notable for the following components:   Glucose, UA 50 (*)    Hgb urine dipstick SMALL (*)    Ketones, ur 5 (*)    Protein, ur >=300 (*)    All other components within normal limits  D-DIMER, QUANTITATIVE (NOT AT [facility]) - Abnormal; Notable for the following components:   D-Dimer, Quant 4.85 (*)    All other components within normal limits  LACTATE DEHYDROGENASE - Abnormal; Notable for the following components:   LDH 266 (*)    All other components within normal limits  FIBRINOGEN - Abnormal; Notable for the following components:   Fibrinogen 477 (*)    All other components within normal limits  C-REACTIVE PROTEIN - Abnormal; Notable for the following components:   CRP 1.7 (*)    All other components within normal limits  MAGNESIUM - Abnormal; Notable for the following components:   Magnesium 1.6 (*)    All other components within normal limits  POC SARS CORONAVIRUS 2 AG -  ED - Abnormal; Notable for the following components:   SARS Coronavirus 2 Ag POSITIVE (*)    All other components within normal limits  TROPONIN I (HIGH SENSITIVITY) - Abnormal; Notable for the following components:   Troponin I (High Sensitivity) 192 (*)    All other components within normal limits  CULTURE, BLOOD (ROUTINE X 2)  CULTURE, BLOOD (ROUTINE X 2)  URINE CULTURE  LACTIC ACID, PLASMA  PROCALCITONIN  FERRITIN  TRIGLYCERIDES  BRAIN NATRIURETIC PEPTIDE  TROPONIN I (HIGH SENSITIVITY)    EKG EKG Interpretation  Date/Time:  Thursday May 21 2019 16:45:44 EST Ventricular Rate:  99 PR Interval:    QRS Duration: 118 QT Interval:  374 QTC  Calculation: 480 R Axis:   80 Text Interpretation: Sinus rhythm Probable left atrial enlargement LVH with secondary repolarization abnormality Anterior infarct, old st depression laterally new Confirmed by Isla Pence [id number]) on 05/21/2019 5:01:02 PM   Radiology Dg Chest Portable 1 View  Result Date: 05/21/2019 CLINICAL DATA:  Loss of consciousness. EXAM: PORTABLE CHEST 1 VIEW COMPARISON:  02/23/2018. FINDINGS: Mediastinum hilar structures normal. Cardiomegaly. Diffuse bilateral pulmonary infiltrates/edema and bilateral pleural effusions. Findings consistent CHF. Bilateral pneumonia cannot be excluded. Prior cervical spine fusion. IMPRESSION: Cardiomegaly with diffuse bilateral pulmonary infiltrates/edema and bilateral pleural effusions. Findings consistent with CHF. Electronically Signed   By: Marcello Moores  Register   On: 05/21/2019 15:38    Procedures Procedures (including critical care time)  Medications Ordered in ED Medications  potassium chloride 10 mEq in 100 mL IVPB (has no administration in time range)  potassium chloride SA (KLOR-CON) CR tablet 40 mEq (has no administration in time range)  aspirin tablet 325 mg (has no administration in time range)  magnesium sulfate IVPB 2 g 50 mL (has no administration in time range)  furosemide (LASIX) injection 40 mg (40 mg Intravenous Given 05/21/19 1618)     Initial Impression / Assessment and Plan / ED Course  I have reviewed the triage vital signs and the nursing notes.  Pertinent labs & imaging results that were available during my care of the patient were reviewed by me and considered in my medical decision making (see chart for details).       Pt's O2 87-  90%, so she was put on 2L oxygen.  Pt's Covid Ag test was +.   Pt is hypokalemic, so she was given Kcl orally and via IV.  Mg level ordered and is low.  Pt given 2g Mg IV.  Troponin is elevated.  Pt does not report any cp.  Could be strain from work of breathing.  Pt d/w Dr.  Velia Meyer (triad) for admission.  CORRENA UMEDA was evaluated in Emergency Department on 05/21/2019 for the symptoms described in the history of present illness. She was evaluated in the context of the global COVID-19 pandemic, which necessitated consideration that the patient might be at risk for infection with the SARS-CoV-2 virus that causes COVID-19. Institutional protocols and algorithms that pertain to the evaluation of patients at risk for COVID-19 are in a state of rapid change based on information released by regulatory bodies including the CDC and federal and state organizations. These policies and algorithms were followed during the patient's care in the ED.  CRITICAL CARE Performed by: Isla Pence   Total critical care time: 30 minutes  Critical care time was exclusive of separately billable procedures and treating other patients.  Critical care was necessary to treat or prevent imminent or life-threatening deterioration.  Critical care was time spent personally by me on the following activities: development of treatment plan with patient and/or surrogate as well as nursing, discussions with consultants, evaluation of patient's response to treatment, examination of patient, obtaining history from patient or surrogate, ordering and performing treatments and interventions, ordering and review of laboratory studies, ordering and review of radiographic studies, pulse oximetry and re-evaluation of patient's condition.  Final Clinical Impressions(s) / ED Diagnoses   Final diagnoses:  [id number] virus detected  Hypokalemia  Altered mental status, unspecified altered mental status type  Elevated troponin  Hypoxia  Hypomagnesemia    ED Discharge Orders    None       Isla Pence, MD 05/21/19 1739    Isla Pence, MD 05/21/19 1743

## 2019-05-21 NOTE — H&P (Signed)
History and Physical    PLEASE NOTE THAT DRAGON DICTATION SOFTWARE WAS USED IN THE CONSTRUCTION OF THIS NOTE.   Erica Crosby YKD:[id number] DOB: 05-08-1946 DOA: 05/21/2019  PCP: [name], DO Patient coming from: Home  I have personally briefly reviewed patient's old medical records in [place]  Chief Complaint: Lethargy  HPI: Erica Crosby is a 73 y.o. 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   In the setting of acute encephalopathy, the following history is obtained via the patient's daughter, with whom the patient lives, as well as my discussions with the emergency department physician, and via chart review.  Patient's daughter reports that the patient has been lethargic over the last day as well as somnolent, sleeping several hours more than she typically would over 24-hour.  At baseline, daughter reports that the patient is active during the day, contributing to completion of ADLs, but that the patient felt too exhausted in a general sense of to get out of bed or take her home medications today.  Daughter conveys that this is a stark contrast relative to the patient's baseline activity level.  Over the last day, the patient reportedly had been complaining of some mild shortness of breath in the absence of any known associated headache, neck stiffness, sore throat, cough, nausea, vomiting, abdominal pain, or rash.  The patient has not undergone any recent traveling, and has no known COVID-19 exposures.  Per my encounter with the  patient, she is somnolent, but will open her eyes to verbal stimuli and will shake or nod her head in response to questions posed to her before falling back asleep.  In this manner, the patient affirms that she has been experiencing subjective fever over the last day, but denies any associated chills, rigors, or generalized myalgias.  She also denies any recent chest pain, palpitations, diaphoresis.  Past medical history is notable for rheumatoid arthritis for which the patient is on chronic immunosuppressive therapy with Humira and leflunomide.  She also has a history of type 2 diabetes mellitus, but no known chronic underlying pulmonary conditions.  She is a former smoker having smoked about half pack per day for 25 years before completely quitting in 2014.   In terms of notable cardiac history, the patient has a history of chronic diastolic heart failure, with most recent echocardiogram performed in September 2017 showing moderate to severe left ventricular hypertrophy, LVEF 65 to 70%, no focal wall motion abnormalities, grade 1 diastolic dysfunction, and no significant valvular pathology.  In 2012, she underwent coronary angiography which revealed nonobstructive CAD.  In September 2017, she underwent adenosine stress Myoview, which showed no evidence of reversible ischemia, although it is unclear to me what precipitated this ischemic evaluation.    ED Course: Vital signs in the emergency department were notable for the following: Temperature max 99.6; heart rate ranged from 85-100; blood pressure ranged from 156/96-170 5/89; respiratory rate 17-20; initial oxygen saturation noted to be 85% on room air, which is subsequently improved into the range of 95 to 97% on 2 L nasal cannula.  This is in the context of no baseline supplemental oxygen requirements.  Labs  in the ED were notable for the following: CMP notable for sodium 139, potassium 2.5, bicarbonate 27, creatinine 1.43, glucose 244, and liver  enzymes were within normal limits.  Magnesium 1.6.  CBC notable for white blood cell count of 2600 with 54% neutrophils, hemoglobin 11.  Procalcitonin less than 0.10.  Lactic acid 1.9.  Urinalysis showed no white blood cells, and was negative for nitrates as well as leukocyte esterase.   Rapid Covid antigen test was positive, and blood cultures x2 were collected.  Inflammatory markers checked in the ED were notable for the following: LDH elevated at 266, RP elevated at 1.7, D-dimer elevated at 4.85, and fibrinogen elevated at 477.   In the setting of shortness of breath over the preceding 1 day, BNP was checked, found to be 974, with no prior value for point comparison.  Initial high-sensitivity troponin I was noted to be 192, with repeat value trending down to 181, with no prior values available for point comparison.  Chest x-ray showed cardiomegaly with diffuse bilateral airspace opacities favoring infiltrates versus edema.  Chest x-ray also showed bilateral pleural effusions in the absence of any evidence of pneumothorax.  EKG showed sinus rhythm with heart rate 99, no evidence of T wave changes, Q waves in V1/V2, which was unchanged from most recent prior EKG from 02/23/2018, and nonspecific less than 1 mm ST depression in V4 through V6.  While still in the ED, the following were administered: Potassium chloride 40 mEq p.o. x1, potassium chloride 10 mEq IV over 1 hour x 1, magnesium sulfate 2 g IV over 2 hours x 1, aspirin 325 mg p.o. x1, and Lasix 40 mg IV x1.  Following discussions with the flow coordinator/AC regarding bed availability at [facility] facilities, the patient was admitted to [facility] for further evaluation and management of COVID pneumonia.    Review of Systems: As per HPI otherwise 10 point review of systems negative.   Past Medical History:  Diagnosis Date   Arthritis    Cataract    Coronary artery disease    Mild plaque 2012   Diabetes mellitus    Essential  hypertension 02/10/2012   GERD (gastroesophageal reflux disease)    Hyperlipidemia    Hypertension    Neuropathy    Peptic ulcer    Rheumatoid arthritis ([place])    TIA (transient ischemic attack) 02/10/2012    Past Surgical History:  Procedure Laterality Date   ABSCESS DRAINAGE     CARPAL TUNNEL RELEASE Left    CHOLECYSTECTOMY     KNEE SURGERY Right    NECK SURGERY     x 2   SHOULDER SURGERY Left     Social History:  reports that she quit smoking about 6 years ago. Her smoking use included cigarettes and e-cigarettes. She quit after 25.00 years of use. She has never used smokeless tobacco. She reports that she does not drink alcohol or use drugs.   No Known Allergies  Family History  Problem Relation Age of Onset   Heart disease Sister        [place] (died age 26) with "enlarged heart"   CAD Sister    Diabetes Mother    Heart disease Mother        Later onset heart disease    Dementia Mother    Alcohol abuse Father    Liver disease Father    CAD Sister 104       CABG   Early death Sister  Diabetes Brother    Kidney disease Brother        related to DM   Diabetes Brother     Prior to Admission medications   Medication Sig Start Date End Date Taking? Authorizing Provider  Adalimumab (HUMIRA PEN [place]) Inject into the skin every 14 (fourteen) days.   Yes [provider]  alendronate (FOSAMAX) 70 MG tablet Take 1 tablet by mouth once a week Patient taking differently: Take 70 mg by mouth once a week.  04/22/19  Yes Gottschalk, Leatrice Jewels M, DO  atorvastatin (LIPITOR) 10 MG tablet Take 1 tablet (10 mg total) by mouth daily. 07/07/18  Yes Ronnie Doss M, DO  carvedilol (COREG) 12.5 MG tablet Take 1 tablet (12.5 mg total) by mouth 2 (two) times daily with a meal. 07/07/18  Yes Gottschalk, Ashly M, DO  enalapril (VASOTEC) 20 MG tablet Take 2 tablets by mouth once daily Patient taking differently: Take 40 mg by mouth daily.  03/03/19  Yes  Ronnie Doss M, DO  folic acid (FOLVITE) 1 MG tablet Take 1 tablet by mouth once daily Patient taking differently: Take 1 mg by mouth daily.  03/16/19  Yes Gottschalk, Leatrice Jewels M, DO  gabapentin (NEURONTIN) 400 MG capsule Take 1 capsule by mouth at bedtime Patient taking differently: Take 400 mg by mouth at bedtime.  02/25/19  Yes Gottschalk, Leatrice Jewels M, DO  leflunomide (ARAVA) 20 MG tablet Take 20 mg by mouth daily.  02/19/18  Yes [provider]  pantoprazole (PROTONIX) 40 MG tablet Take 1 tablet by mouth once daily Patient taking differently: Take 40 mg by mouth daily.  03/09/19  Yes Ronnie Doss M, DO  aspirin EC 81 MG tablet Take 81 mg by mouth daily.    [provider]  Insulin Glargine (LANTUS SOLOSTAR) 100 UNIT/ML Solostar Pen INJECT 30 UNITS INTO THE SKIN AT BEDTIME 07/07/18   Ronnie Doss M, DO  Omega-3 Fatty Acids (FISH OIL) 1000 MG CAPS Take 1 capsule by mouth daily.     [provider]  potassium chloride SA (K-DUR) 20 MEQ tablet Take 1 tablet (20 mEq total) by mouth daily for 3 days. 03/13/19 03/16/19  [name], DO    Objective    Physical Exam: Vitals:   05/21/19 1520 05/21/19 1530 05/21/19 1600 05/21/19 1645  BP:  (!) 180/97 (!) 175/89 (!) 186/104  Pulse:  93  100  Resp:  '19 18 18  ' Temp:      TempSrc:      SpO2: 91% 96%  91%    General: appears to be stated age; somnolent; will briefly open eyes to verbal stimuli and shake or nod head in response to questions posed to her; noted to spontaneously move all 4 extremities.  Skin: warm, dry, no rash Head:  AT/[place] Eyes:  PEARL b/l, EOMI Mouth:  Oral mucosa membranes appear dry, normal dentition Neck: supple; trachea midline Heart:  RRR; did not appreciate any M/R/G Lungs: CTAB, did not appreciate any wheezes, rales, or rhonchi Abdomen: + BS; soft, ND, NT Vascular: 2+ pedal pulses b/l; 2+ radial pulses b/l Extremities: trace edema in b/l LE's; no muscle wasting Neuro: Unable to  perform full neurologic assessment in the setting of somnolence/acute encephalopathy; however, the patient was observed to spontaneously move all 4 extremities.    Labs on Admission: I have personally reviewed following labs and imaging studies  CBC: Recent Labs  Lab 05/21/19 1559  WBC 2.6*  NEUTROABS 1.4*  HGB 11.3*  HCT 36.7  MCV 84.4  PLT 885   Basic Metabolic Panel: Recent Labs  Lab 05/21/19 1559  NA 139  K 2.5*  CL 101  CO2 27  GLUCOSE 244*  BUN 13  CREATININE 1.43*  CALCIUM 8.2*  MG 1.6*   GFR: CrCl cannot be calculated (Unknown ideal weight.). Liver Function Tests: Recent Labs  Lab 05/21/19 1559  AST 34  ALT 14  ALKPHOS 67  BILITOT 0.8  PROT 6.7  ALBUMIN 2.9*   No results for input(s): LIPASE, AMYLASE in the last 168 hours. No results for input(s): AMMONIA in the last 168 hours. Coagulation Profile: No results for input(s): INR, PROTIME in the last 168 hours. Cardiac Enzymes: No results for input(s): CKTOTAL, CKMB, CKMBINDEX, TROPONINI in the last 168 hours. BNP (last 3 results) No results for input(s): PROBNP in the last 8760 hours. HbA1C: No results for input(s): HGBA1C in the last 72 hours. CBG: No results for input(s): GLUCAP in the last 168 hours. Lipid Profile: Recent Labs    05/21/19 1559  TRIG 101   Thyroid Function Tests: No results for input(s): TSH, T4TOTAL, FREET4, T3FREE, THYROIDAB in the last 72 hours. Anemia Panel: Recent Labs    05/21/19 1559  FERRITIN 122   Urine analysis:    Component Value Date/Time   COLORURINE YELLOW 05/21/2019 1508   APPEARANCEUR CLEAR 05/21/2019 1508   APPEARANCEUR Hazy (A) 05/02/2017 1724   LABSPEC 1.011 05/21/2019 1508   PHURINE 6.0 05/21/2019 1508   GLUCOSEU 50 (A) 05/21/2019 1508   HGBUR SMALL (A) 05/21/2019 1508   BILIRUBINUR NEGATIVE 05/21/2019 1508   BILIRUBINUR Negative 05/02/2017 1724   KETONESUR 5 (A) 05/21/2019 1508   PROTEINUR >=300 (A) 05/21/2019 1508   UROBILINOGEN 1.0  02/10/2012 0817   NITRITE NEGATIVE 05/21/2019 1508   LEUKOCYTESUR NEGATIVE 05/21/2019 1508    Radiological Exams on Admission: Dg Chest Portable 1 View  Result Date: 05/21/2019 CLINICAL DATA:  Loss of consciousness. EXAM: PORTABLE CHEST 1 VIEW COMPARISON:  02/23/2018. FINDINGS: Mediastinum hilar structures normal. Cardiomegaly. Diffuse bilateral pulmonary infiltrates/edema and bilateral pleural effusions. Findings consistent CHF. Bilateral pneumonia cannot be excluded. Prior cervical spine fusion. IMPRESSION: Cardiomegaly with diffuse bilateral pulmonary infiltrates/edema and bilateral pleural effusions. Findings consistent with CHF. Electronically Signed   By: Marcello Moores  Register   On: 05/21/2019 15:38   EKG: Independently reviewed, with result as described above.   Assessment/Plan   Erica Crosby is a 73 y.o. 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   Principal Problem:   Pneumonia due to COVID-19 virus Active Problems:   Uncontrolled type 2 diabetes mellitus with diabetic nephropathy, with long-term current use of insulin (HCC)   RA (rheumatoid arthritis) (HCC)   Acute metabolic encephalopathy   Hypokalemia   Hypomagnesemia   Elevated troponin   #) Severe COVID-19 pneumonia: Diagnosis on the basis of 1 day shortness of breath associated with lethargy/somnolence, increased work of breathing, acute hypoxic respiratory distress, rapid Covid antigen performed in the ED today found to be positive, elevation of multiple general inflammatory markers, and  presenting chest x-ray showing diffuse bilateral patchy airspace opacities favoring infiltrates.  Procalcitonin found to be less than 0.10, providing further support  for the absence of underlying bacterial pneumonia. in the context of no baseline supplemental oxygen requirements, initial oxygen saturation was noted to be 87% on room air, which is improved to the range of 95 to 97% on 2 L nasal cannula.  In the setting of this acute hypoxic respiratory distress, the patient's COVID-19 pna meets criteria to be considered severe nature, thereby meeting criteria for grade 2C recommendation for initiation of dexamethasone as well as remdesivir, which is further supported by the treatment guidance recommendations from [facility] health's Covid treatment committee.  Of note, review of this evening's liver enzymes are notable for nonelevated ALT. in terms of inflammatory markers, while D-dimer is elevated, it does not quite meet threshold for escalation of Lovenox therapy to intermediate dose prophylaxis.  No formal underlying chronic pulmonary conditions, although the patient does have a prolonged smoking history, as further described above.  Additionally, she processes multiple factors and comorbidities that predispose her to experience more complicated course of OMVEH-20 pneumonia.  Included in this, the patient has an autoimmune history in the context of a history of rheumatoid arthritis, for which she is chronically immunosuppressed on Humira and leflunomide.  She also has notable comorbidities that include type 2 diabetes mellitus as well as stage III chronic disease.  Patient's daughter conveys no known COVID-19 exposures.   In terms of decision-making regarding which  facility to which the patient will be admitted: She is requiring only 2 L nasal cannula in order to maintain adequate oxygen saturations.  She therefore does not require PCU or ICU level care at this time and thereby does not meet criteria to  be admitted to [facility].  Per discussions with flow coordinator/AC regarding bed availability, there are no COVID beds currently available at [facility] or [place] [facility].  Consequently, the patient will be admitted to [facility] for further evaluation and management of presenting COVID-19 pneumonia.    Plan: Admit to [facility], per rationale described above.  Airborne and contact precautions with eye protection.  Check ABG for the purpose of evaluating PaO2 to FiO2 ratio.  Monitor continuous pulse oximetry.  As needed supplemental oxygen in order to maintain oxygen saturations greater than or equal to 92%.  Proning protocol initiated.  Monitor on telemetry.  Check serum phosphorus level.  Repeat CBC with differential in the morning.  As described above, will initiate dexamethasone 6 mg p.o. daily as well as remdesivir 200 mg IV x1 dose now, followed by 100 mg IV daily.  Repeat CMP in the morning, with attention to interval trending of ALT value.  As needed acetaminophen for fever as well as as needed albuterol inhaler.  Repeat chest x-ray in the morning to monitor for interval trend of infiltrates.  Will repeat general inflammatory markers in the morning.  Can consider Tocilizumab if worsening hypoxemia and CRP > 10    #) Severe Sepsis: Appears to be on the basis of COVID-19, as above, with Sirs criteria met via presenting leukopenia and tachycardia.  Criteria are met for patient sepsis to be considered severe in nature on the basis of concomitant acute hypoxic respiratory distress.  Of note, presenting lactic acid found to be nonelevated at 1.9, and no evidence of hypotension thus far.  No evidence of additional underlying infectious process beyond COVID-19, and bacterial pneumonia is felt to be less likely. As patient's sepsis is felt to be viral in nature, will refrain from expanding antibiotic coverage.  Of note, blood cultures x2 were  collected in the ED this evening.  Additionally  urinalysis was not suggestive of underlying UTI.   Plan: Work-up and management of COVID-19 pna, as above.  Repeat CBC with differential in the morning.  Will follow with results of blood cultures x2.     #) Acute metabolic encephalopathy: Lethargy and somnolence over the course of the last day, representing contrast from baseline activity, as described above.  Appears to be metabolic in nature on the basis of physiologic stress stemming from presenting severe COVID pna, as above.  No other obvious source of underlying infection at this time.  While unable to complete full neurologic assessment at this time, the patient is noted to spontaneously move all 4 extremities, and acute ischemic CVAs felt to be less likely.  No evidence of underlying seizures at this time.  Plan: Work-up and management of COVID pna, as above.  Will refrain from initiation of diet or oral medications until patient is able to pass nursing bedside swallow evaluation.  Hold home gabapentin for now.  Check TSH.  Repeat CMP and CBC in the morning.     #) Hypokalemia: Presenting serum potassium level noted to be 2.5.  Further complicated by hypomagnesemia, as further described below.  Plan: Status post a total of 50 mEq of potassium chloride via combination of oral and IV administration routes.  Provide supplemental IV magnesium, as described below.  Repeat BMP in the morning as well as serum magnesium level at that time.  Monitor on telemetry.      #) Hypomagnesemia: Serum magnesium noted to be slightly low at 1.6.   Plan: Magnesium sulfate 2 g IV every 2 hours x1 now.  Will repeat serum magnesium level in the morning.      #) Elevated troponin: Initial high-sensitivity troponin found to be 192, with subsequent value trending down to 181.  The patient denies any recent chest pain, and presenting EKG shows sinus rhythm with unchanged Q waves in leads V1/V2, no evidence of T wave changes, and nonspecific less than 1 mm  ST depression in V4 through V6.  Troponin elevation felt to be on the basis of type II supply demand mismatch in the setting of acute hypoxic respiratory distress due severe COVID pna as opposed as to representing a type I NSTEMI due to acute plaque rupture.  It appears much less likely that the patient would develop COVID-19 pna one the exact same day that she experienced plaque rupture resulting in a type 1 process.  Can consider obtaining echocardiogram to evaluate for any interval focal wall motion abnormalities relative to the result of echocardiogram performed in September 2017.  While presenting elevated troponin is felt to be on the basis of a type II process, will start heparin drip at this time, in the absence of preceding bolus.  Received full dose aspirin in the ED.   Plan: Work-up and management of acute hypoxic respiratory distress in setting of severe COVID-19 pna, as above, including monitoring of continuous pulse oximetry and as needed supplemental oxygen order to maintain oxygen saturations greater than or equal to 92%.  Start heparin drip, as above.  For now, will increase home dose of atorvastatin 10 mg p.o. nightly to 40 mg p.o. nightly, with first dose now for anti-inflammatory and plaque stabilization qualities.  Continue home beta-blocker and ACE inhibitor.  Monitor on telemetry.  Will recheck troponin in the morning, and I have ordered a repeat EKG to occur at that time as well.     #)  Chronic diastolic heart failure: With most recent echocardiogram in September 2017, with results as described above, which were notable for LVEF 65 to 70%, no evidence of focal wall motion normalities, and evidence of grade 1 diastolic dysfunction.  While it is possible that this evening's presentation may be associated with very mild decompensation of chronic diastolic heart failure, presentation clinically and radiographically appears much more consistent with severe COVID-19 pna.  Correlation of  presenting BNP difficulty in the absence of any prior BNP values for point comparison.  Diffuse bilateral patchy opacities identified on today's chest x-ray appear to favor infiltrates in the setting of COVID-19 pna, and are concerning for increased risk of developing ARDS in the setting of heightened inflammatory cascade.  Of note, the patient is on no diuretics at home.  Plan: Monitor strict I's and O's and daily weights.  We will closely monitor for evidence of volume overload, particularly given that the patient will be receiving a small continuous volume the heparin drip, as further described above.  Repeat CMP in the morning.     #) Type 2 diabetes mellitus: On Lantus 30 units subcu nightly as an outpatient.  Does not appear to be on any short acting insulin at home.  Presenting blood sugar per presenting CMP noted to be 244, while most recent hemoglobin A1c was noted to be 8.3% when checked on 03/11/2019.   Plan: We will proceed conservatively with initial dose of basal insulin.  Specifically, I have ordered Lantus 10 units subcu nightly.  Will follow for result of tomorrow morning's fasting blood sugar to assess for adequacy of this initial dose.  Additionally, will monitor Accu-Cheks before every meal and at bedtime with moderate dose sliding scale insulin.     #) Stage III chronic kidney disease: Associated with baseline creatinine of 1.2-1.4.  CVAs labs reflect serum creatinine within this baseline range.  Plan: Monitor strict I's and O's and daily weights.  Attempt to avoid nephrotoxic agents.  Recheck serum creatinine level of the CMP that has been ordered for tomorrow morning.    DVT prophylaxis: Heparin drip, as above.  Code Status: Full Family Communication: Patient's case was discussed with her daughter, with whom the patient lives.  Disposition Plan:  Per Rounding Team Consults called: None Admission status: Inpatient admission to the med telemetry floor at [facility], with rationale for such as described above.    PLEASE NOTE THAT DRAGON DICTATION SOFTWARE WAS USED IN THE CONSTRUCTION OF THIS NOTE.   [facility] Pager [phone] From 3PM- 11PM.   Otherwise, please contact night-coverage  [email or web address] Password [email or web address]  05/21/2019, 5:51 PM

## 2019-05-21 NOTE — Progress Notes (Signed)
ANTICOAGULATION CONSULT NOTE - Initial Consult  Pharmacy Consult for Heparin Indication: NSTEMI  No Known Allergies  Patient Measurements:   Heparin Dosing Weight: estimated 60kg  Vital Signs: Temp: 98.9 F (37.2 C) (12/03 2030) Temp Source: Oral (12/03 2030) BP: 167/95 (12/03 2100) Pulse Rate: 88 (12/03 2100)  Labs: Recent Labs    05/21/19 1559 05/21/19 1754  HGB 11.3*  --   HCT 36.7  --   PLT 178  --   CREATININE 1.43*  --   TROPONINIHS 192* 181*    CrCl cannot be calculated (Unknown ideal weight.).   Medical History: Past Medical History:  Diagnosis Date  . Arthritis   . Cataract   . Coronary artery disease    Mild plaque 2012  . Diabetes mellitus   . Essential hypertension 02/10/2012  . GERD (gastroesophageal reflux disease)   . Hyperlipidemia   . Hypertension   . Neuropathy   . Peptic ulcer   . Rheumatoid arthritis ([place])   . TIA (transient ischemic attack) 02/10/2012    Medications:  Scheduled:  . [START ON 05/22/2019] aspirin EC  81 mg Oral Daily  . atorvastatin  40 mg Oral q1800  . carvedilol  12.5 mg Oral BID WC  . dexamethasone  6 mg Oral Daily  . [START ON 05/22/2019] enalapril  40 mg Oral Daily  . [START ON [id number] folic acid  1 mg Oral Daily  . [START ON 05/22/2019] insulin aspart  0-9 Units Subcutaneous TID WC  . insulin glargine  10 Units Subcutaneous QHS  . [START ON 05/22/2019] leflunomide  20 mg Oral Daily  . sodium chloride flush  3 mL Intravenous Q12H   Infusions:  . remdesivir 200 mg in sodium chloride 0.9% 250 mL IVPB     Followed by  . [START ON 05/22/2019] remdesivir 100 mg in NS 100 mL     PRN: acetaminophen **OR** acetaminophen  Assessment: 73 yo female with COVID pneumonia.  Patient with history of CAD, now with concern for type 2 NSTEMI, troponins 192, 181.  Pharmacy consulted heparin drip WITHOUT initial bolus.  Hgb 11.3, Plts wnl.  Baseline PTT, PT/INR pending.  No anticoagulant use prior to admission.  Goal of  Therapy:  Heparin level 0.3-0.7 units/ml Monitor platelets by anticoagulation protocol: Yes   Plan:   No heparin bolus per MD  Heparin IV infusion 800 units/hr  Check heparin level in 8hrs  Daily heparin level and CBC  Peggyann Juba, PharmD, BCPS Pharmacy: [phone] 05/21/2019,9:35 PM

## 2019-05-21 NOTE — ED Triage Notes (Signed)
Pt brought in by EMS due to altered mental status. Family did not feel she was responding appropriately. CBG 180  BP 180/100  Has not taken meds in 2 days. Pt not interacting verbally with staff just nods

## 2019-05-21 NOTE — ED Notes (Signed)
Pt had episode of diarrhea. NT assisted daughter with cleaning pt

## 2019-05-21 NOTE — ED Notes (Signed)
Per Pt request, Pts daughter [name] was updated on Pt status. Pt is set for admission and family aware of Covid-19 Positive result.

## 2019-05-22 ENCOUNTER — Inpatient Hospital Stay (HOSPITAL_COMMUNITY): Payer: Medicare Other

## 2019-05-22 DIAGNOSIS — G9341 Metabolic encephalopathy: Secondary | ICD-10-CM | POA: Diagnosis present

## 2019-05-22 DIAGNOSIS — A419 Sepsis, unspecified organism: Secondary | ICD-10-CM

## 2019-05-22 DIAGNOSIS — E876 Hypokalemia: Secondary | ICD-10-CM | POA: Diagnosis present

## 2019-05-22 DIAGNOSIS — R778 Other specified abnormalities of plasma proteins: Secondary | ICD-10-CM | POA: Diagnosis present

## 2019-05-22 DIAGNOSIS — I340 Nonrheumatic mitral (valve) insufficiency: Secondary | ICD-10-CM

## 2019-05-22 LAB — CBC
HCT: 37.3 % (ref 36.0–46.0)
Hemoglobin: 11.6 g/dL — ABNORMAL LOW (ref 12.0–15.0)
MCH: 26 pg (ref 26.0–34.0)
MCHC: 31.1 g/dL (ref 30.0–36.0)
MCV: 83.6 fL (ref 80.0–100.0)
Platelets: 164 10*3/uL (ref 150–400)
RBC: 4.46 MIL/uL (ref 3.87–5.11)
RDW: 15.9 % — ABNORMAL HIGH (ref 11.5–15.5)
WBC: 2.2 10*3/uL — ABNORMAL LOW (ref 4.0–10.5)
nRBC: 0 % (ref 0.0–0.2)

## 2019-05-22 LAB — COMPREHENSIVE METABOLIC PANEL
ALT: 13 U/L (ref 0–44)
AST: 33 U/L (ref 15–41)
Albumin: 2.6 g/dL — ABNORMAL LOW (ref 3.5–5.0)
Alkaline Phosphatase: 62 U/L (ref 38–126)
Anion gap: 13 (ref 5–15)
BUN: 12 mg/dL (ref 8–23)
CO2: 28 mmol/L (ref 22–32)
Calcium: 8.1 mg/dL — ABNORMAL LOW (ref 8.9–10.3)
Chloride: 104 mmol/L (ref 98–111)
Creatinine, Ser: 1.42 mg/dL — ABNORMAL HIGH (ref 0.44–1.00)
GFR calc Af Amer: 43 mL/min — ABNORMAL LOW (ref >60)
GFR calc non Af Amer: 37 mL/min — ABNORMAL LOW (ref >60)
Glucose, Bld: 156 mg/dL — ABNORMAL HIGH (ref 70–99)
Potassium: 3.1 mmol/L — ABNORMAL LOW (ref 3.5–5.1)
Sodium: 145 mmol/L (ref 135–145)
Total Bilirubin: 0.9 mg/dL (ref 0.3–1.2)
Total Protein: 6.3 g/dL — ABNORMAL LOW (ref 6.5–8.1)

## 2019-05-22 LAB — GLUCOSE, CAPILLARY
Glucose-Capillary: 156 mg/dL — ABNORMAL HIGH (ref 70–99)
Glucose-Capillary: 132 mg/dL — ABNORMAL HIGH (ref 70–99)
Glucose-Capillary: 103 mg/dL — ABNORMAL HIGH (ref 70–99)
Glucose-Capillary: 94 mg/dL (ref 70–99)

## 2019-05-22 LAB — MAGNESIUM: Magnesium: 1.8 mg/dL (ref 1.7–2.4)

## 2019-05-22 LAB — FIBRINOGEN: Fibrinogen: 444 mg/dL (ref 210–475)

## 2019-05-22 LAB — BLOOD GAS, ARTERIAL
Acid-Base Excess: 6.4 mmol/L — ABNORMAL HIGH (ref 0.0–2.0)
Bicarbonate: 29.9 mmol/L — ABNORMAL HIGH (ref 20.0–28.0)
FIO2: 21
O2 Saturation: 83.5 %
Patient temperature: 37
pCO2 arterial: 40.9 mmHg (ref 32.0–48.0)
pH, Arterial: 7.479 — ABNORMAL HIGH (ref 7.350–7.450)
pO2, Arterial: 51.1 mmHg — ABNORMAL LOW (ref 83.0–108.0)

## 2019-05-22 LAB — TROPONIN I (HIGH SENSITIVITY): Troponin I (High Sensitivity): 171 ng/L (ref <18)

## 2019-05-22 LAB — URINE CULTURE: Culture: <10000 — AB

## 2019-05-22 LAB — FERRITIN: Ferritin: 117 ng/mL (ref 11–307)

## 2019-05-22 LAB — ECHOCARDIOGRAM COMPLETE
Height: 67 in
Weight: 2038.81 oz

## 2019-05-22 LAB — HEPARIN LEVEL (UNFRACTIONATED)
Heparin Unfractionated: 0.55 IU/mL (ref 0.30–0.70)
Heparin Unfractionated: 0.93 IU/mL — ABNORMAL HIGH (ref 0.30–0.70)

## 2019-05-22 LAB — TSH: TSH: 0.365 u[IU]/mL (ref 0.350–4.500)

## 2019-05-22 LAB — C-REACTIVE PROTEIN: CRP: 1.6 mg/dL — ABNORMAL HIGH (ref <1.0)

## 2019-05-22 LAB — D-DIMER, QUANTITATIVE: D-Dimer, Quant: 3.24 ug/mL-FEU — ABNORMAL HIGH (ref 0.00–0.50)

## 2019-05-22 MED ORDER — SODIUM CHLORIDE 0.9 % IV SOLN
100.0000 mg | Freq: Every day | INTRAVENOUS | Status: AC
  Administered 2019-05-22 – 2019-05-25 (×4): 100 mg via INTRAVENOUS
  Filled 2019-05-22 (×6): qty 20

## 2019-05-22 NOTE — Progress Notes (Signed)
*  PRELIMINARY RESULTS* Echocardiogram 2D Echocardiogram has been performed.  Leavy Cella 05/22/2019, 4:25 PM

## 2019-05-22 NOTE — Progress Notes (Signed)
ANTICOAGULATION CONSULT NOTE -  Pharmacy Consult for Heparin Indication: NSTEMI  No Known Allergies  Patient Measurements: Height: 5\' 7"  (170.2 cm) Weight: 127 lb 6.8 oz (57.8 kg) IBW/kg (Calculated) : 61.6 Heparin Dosing Weight: estimated 60kg  Vital Signs: Temp: 98.7 F (37.1 C) (12/04 1300) Temp Source: Oral (12/04 1300) BP: 185/74 (12/04 1300) Pulse Rate: 72 (12/04 1300)  Labs: Recent Labs    05/21/19 1559 05/21/19 1754 05/21/19 1759 05/22/19 0614 05/22/19 1501  HGB 11.3*  --   --  11.6*  --   HCT 36.7  --   --  37.3  --   PLT 178  --   --  164  --   APTT  --   --  37*  --   --   LABPROT  --   --  15.0  --   --   INR  --   --  1.2  --   --   HEPARINUNFRC  --   --   --  0.55 0.93*  CREATININE 1.43*  --   --  1.42*  --   TROPONINIHS 192* 181*  --  171*  --     Estimated Creatinine Clearance: 32.7 mL/min (A) (by C-G formula based on SCr of 1.42 mg/dL (H)).   Medical History: Past Medical History:  Diagnosis Date  . Arthritis   . Cataract   . Coronary artery disease    Mild plaque 2012  . Diabetes mellitus   . Essential hypertension 02/10/2012  . GERD (gastroesophageal reflux disease)   . Hyperlipidemia   . Hypertension   . Neuropathy   . Peptic ulcer   . Rheumatoid arthritis ([place])   . TIA (transient ischemic attack) 02/10/2012    Medications:  Scheduled:  . aspirin EC  81 mg Oral Daily  . atorvastatin  40 mg Oral q1800  . carvedilol  12.5 mg Oral BID WC  . dexamethasone  6 mg Oral Daily  . enalapril  40 mg Oral Daily  . folic acid  1 mg Oral Daily  . insulin aspart  0-9 Units Subcutaneous TID WC  . insulin glargine  10 Units Subcutaneous QHS  . sodium chloride flush  3 mL Intravenous Q12H   Infusions:  . heparin 800 Units/hr (05/22/19 1545)  . remdesivir 100 mg in NS 100 mL Stopped (05/22/19 1400)   PRN: acetaminophen **OR** acetaminophen  Assessment: 73 yo female with COVID pneumonia.  Patient with history of CAD, now with concern for  type 2 NSTEMI, troponins 192, 181.  Pharmacy consulted heparin drip WITHOUT initial bolus.  Hgb 11.6, Plts wnl.   No anticoagulant use prior to admission.  Heparin level supratherapeutic at 0.93  Goal of Therapy:  Heparin level 0.3-0.7 units/ml Monitor platelets by anticoagulation protocol: Yes   Plan:  Decrease heparin IV infusion to 500 units/hr Check heparin level in 8hrs/AM Daily heparin level and CBC  Thomasenia Sales, PharmD, MBA, BCGP Clinical Pharmacist  05/22/2019 4:50 PM

## 2019-05-22 NOTE — Progress Notes (Signed)
ANTICOAGULATION CONSULT NOTE -  Pharmacy Consult for Heparin Indication: NSTEMI  No Known Allergies  Patient Measurements: Height: 5\' 7"  (170.2 cm) Weight: 127 lb 6.8 oz (57.8 kg) IBW/kg (Calculated) : 61.6 Heparin Dosing Weight: estimated 60kg  Vital Signs: Temp: 98.1 F (36.7 C) (12/04 0439) Temp Source: Oral (12/04 0439) BP: 164/79 (12/04 0439) Pulse Rate: 72 (12/04 0439)  Labs: Recent Labs    05/21/19 1559 05/21/19 1754 05/21/19 1759 05/22/19 0614  HGB 11.3*  --   --  11.6*  HCT 36.7  --   --  37.3  PLT 178  --   --  164  APTT  --   --  37*  --   LABPROT  --   --  15.0  --   INR  --   --  1.2  --   HEPARINUNFRC  --   --   --  0.55  CREATININE 1.43*  --   --  1.42*  TROPONINIHS 192* 181*  --  171*    Estimated Creatinine Clearance: 32.7 mL/min (A) (by C-G formula based on SCr of 1.42 mg/dL (H)).   Medical History: Past Medical History:  Diagnosis Date  . Arthritis   . Cataract   . Coronary artery disease    Mild plaque 2012  . Diabetes mellitus   . Essential hypertension 02/10/2012  . GERD (gastroesophageal reflux disease)   . Hyperlipidemia   . Hypertension   . Neuropathy   . Peptic ulcer   . Rheumatoid arthritis ([place])   . TIA (transient ischemic attack) 02/10/2012    Medications:  Scheduled:  . aspirin EC  81 mg Oral Daily  . atorvastatin  40 mg Oral q1800  . carvedilol  12.5 mg Oral BID WC  . dexamethasone  6 mg Oral Daily  . enalapril  40 mg Oral Daily  . folic acid  1 mg Oral Daily  . insulin aspart  0-9 Units Subcutaneous TID WC  . insulin glargine  10 Units Subcutaneous QHS  . leflunomide  20 mg Oral Daily  . sodium chloride flush  3 mL Intravenous Q12H   Infusions:  . heparin 800 Units/hr (05/21/19 2210)  . remdesivir 100 mg in NS 100 mL     PRN: acetaminophen **OR** acetaminophen  Assessment: 73 yo female with COVID pneumonia.  Patient with history of CAD, now with concern for type 2 NSTEMI, troponins 192, 181.  Pharmacy  consulted heparin drip WITHOUT initial bolus.  Hgb 11.6, Plts wnl.   No anticoagulant use prior to admission.  Heparin level therapeutic at 0.55  Goal of Therapy:  Heparin level 0.3-0.7 units/ml Monitor platelets by anticoagulation protocol: Yes   Plan:  Continue heparin IV infusion 800 units/hr Check heparin level in 8hrs Daily heparin level and CBC  Margot Ables, PharmD Clinical Pharmacist 05/22/2019 7:54 AM

## 2019-05-22 NOTE — Plan of Care (Signed)
  Problem: Education: Goal: Knowledge of General Education information will improve Description Including pain rating scale, medication(s)/side effects and non-pharmacologic comfort measures Outcome: Progressing   

## 2019-05-22 NOTE — Progress Notes (Signed)
PROGRESS NOTE    Erica Crosby  PTW:[id number] DOB: Jun 15, 1946 DOA: 05/21/2019 PCP: [name], DO     Brief Narrative:  As per H&P written by Dr. Velia Meyer on 05/21/19 Erica Crosby is a 73 y.o. 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 found with sepsis due to COVID 19 infection and NSTEMI/demand ischemia.   Assessment & Plan: 1-pneumonia due to COVID-19 virus -Patient has met criteria for the use of IV steroids). -Holding on antibiotics at this moment in the setting of low procalcitonin and most likely viral driven infection. -Continue IV fluids and supportive care -Wean oxygen supplementation as tolerated.  2-sepsis secondary to Covid infection -Patient had met sepsis criteria at time of admission -Continue treatment for COVID-19 as mentioned above -Continue IV fluids and supportive care -As needed antipyretics, analgesics and antiemetics. -No need for antibiotics currently.  3-Uncontrolled type 2 diabetes mellitus with diabetic nephropathy, with long-term current use of insulin (HCC) and nephropathy. -Anticipate an increase CBGs with the use of steroids -Continue sliding scale insulin and Lantus -Most recent A1c 8.3. -Follow CBGs and adjust hypoglycemic regimen as needed.  4-acute metabolic encephalopathy -In the setting of sepsis/Covid infection -No focal neurologic deficit appreciated -Continue treatment as mentioned above and supportive care -Patient was able to answer questions appropriately and follow commands. -Follow  clinical response.  5-chronic kidney disease a stage IIIb -In the setting of diabetes and hypertension. -Creatinine appears to be stable at baseline at this moment. -Continue fluid resuscitation minimize the use of nephrotoxic agents -Follow renal function trend.  6-RA (rheumatoid arthritis) (HCC) -Continue holding immunosuppressant agents in the setting of acute infection. -As needed pain medication has been ordered. -Continue outpatient follow-up with rheumatology service as an outpatient.  7-Hypokalemia and Hypomagnesemia -In the setting of poor oral intake and dehydration -Continue electrolytes repletion -Follow electrolytes trend -Continue supportive care, fluid resuscitation advancing diet as tolerated.  8-Elevated troponin: Type I NSTEMI/demand ischemia -Patient denies chest pain -Shortness of breath most likely driven by an active/acute COVID-19 pneumonia -Follow 2D echo results -For now continue heparin drip -Continue beta-blocker, ACE inhibitors and statins. -EKG and telemetry not demonstrating acute ischemic changes. -continue telemetry monitoring for now.  9-chronic diastolic heart failure -Appears to be compensated -Will follow 2D echo results; prior echo from September 2017 demonstrated an ejection fraction of 65 to 70% with a grade 1 diastolic dysfunction. -Continue to follow daily weights and strict I's and O's -Continue judicious fluid resuscitation at this point.    DVT prophylaxis: heparin  Code Status: Full code Family Communication: No family at bedside. Disposition Plan: Remains inpatient, follow 2D echo results; continue IV heparin for now.  Continue treatment with remdesivir and steroids.  Continue supportive care and follow clinical response.  Wean oxygen supplementation as tolerated.  Consultants:   None  Procedures:   See below for x-ray reports  2D echo: Pending  Antimicrobials:  Anti-infectives (From admission, onward)   Start      Dose/Rate Route Frequency Ordered Stop   05/22/19 1000  remdesivir 100 mg in sodium chloride 0.9 % 100 mL IVPB  Status:  Discontinued     100 mg 200 mL/hr  over 30 Minutes Intravenous Daily 05/21/19 2123 05/22/19 0946   05/22/19 1000  remdesivir 100 mg in sodium chloride 0.9 % 100 mL IVPB     100 mg 200 mL/hr over 30 Minutes Intravenous Daily 05/22/19 0946 05/26/19 0959   05/21/19 2200  remdesivir 200 mg in sodium chloride 0.9% 250 mL IVPB     200 mg 580 mL/hr over 30 Minutes Intravenous Once 05/21/19 2123 05/22/19 0019       Subjective: No fever currently; low-grade temperature appreciated overnight.  Requiring oxygen supplementation to maintain O2 sats and complaining of shortness of breath.  Patient denies chest pain.  Objective: Vitals:   05/21/19 2235 05/21/19 2352 05/22/19 0439 05/22/19 1300  BP: (!) 176/101  (!) 164/79 (!) 185/74  Pulse: 96  72 72  Resp: '18  16 16  ' Temp: 99.1 F (37.3 C)  98.1 F (36.7 C) 98.7 F (37.1 C)  TempSrc: Oral  Oral Oral  SpO2: 100%  92% 94%  Weight:  57.8 kg    Height:  '5\' 7"'  (1.702 m)      Intake/Output Summary (Last 24 hours) at 05/22/2019 1646 Last data filed at 05/22/2019 1545 Gross per 24 hour  Intake 719.98 ml  Output -  Net 719.98 ml   Filed Weights   05/21/19 2352  Weight: 57.8 kg    Examination: General exam: Alert, awake, oriented x 3; reports no chest pain, no nausea and no vomiting.  Poor appetite, general malaise, short of breath with minimal exertion requiring oxygen supplementation.  Currently afebrile. Respiratory system: Positive rhonchi bilaterally; no using accessory muscles. Cardiovascular system:RRR. No murmurs, rubs, gallops.  No JVD Gastrointestinal system: Abdomen is nondistended, soft and nontender. No organomegaly or masses felt. Normal bowel sounds heard. Central nervous system: Alert and oriented. No focal neurological deficits. Extremities: No cyanosis or clubbing. Skin: No no open wounds or petechiae.  Psychiatry: Judgement and insight appear normal. Mood & affect appropriate.     Data Reviewed: I have personally reviewed following labs and imaging studies  CBC: Recent Labs  Lab 05/21/19 1559 05/22/19 0614  WBC 2.6* 2.2*  NEUTROABS 1.4*  --   HGB 11.3* 11.6*  HCT 36.7 37.3  MCV 84.4 83.6  PLT 178 245   Basic Metabolic Panel: Recent Labs  Lab 05/21/19 1559 05/22/19 0614  NA 139 145  K 2.5* 3.1*  CL 101 104  CO2 27 28  GLUCOSE 244* 156*  BUN 13 12  CREATININE 1.43* 1.42*  CALCIUM 8.2* 8.1*  MG 1.6* 1.8   GFR: Estimated Creatinine Clearance: 32.7 mL/min (A) (by C-G formula based on SCr of 1.42 mg/dL (H)).   Liver Function Tests: Recent Labs  Lab 05/21/19 1559 05/22/19 0614  AST 34 33  ALT 14 13  ALKPHOS 67 62  BILITOT 0.8 0.9  PROT 6.7 6.3*  ALBUMIN 2.9* 2.6*   Coagulation Profile: Recent Labs  Lab 05/21/19 1759  INR 1.2   CBG: Recent Labs  Lab 05/21/19 2313 05/22/19 0731 05/22/19 1102 05/22/19 1607  GLUCAP 133* 156* 132* 103*   Lipid Profile: Recent Labs    05/21/19 1559  TRIG 101   Thyroid Function Tests: Recent Labs    05/22/19 0614  TSH 0.365   Anemia Panel: Recent Labs    05/21/19 1559 05/22/19 0614  FERRITIN 122 117   Urine analysis:    Component Value Date/Time   COLORURINE YELLOW 05/21/2019 [facility] 05/21/2019 1508   APPEARANCEUR Hazy (A) 05/02/2017 1724  LABSPEC 1.011 05/21/2019 1508   PHURINE 6.0 05/21/2019 1508   GLUCOSEU 50 (A) 05/21/2019 1508   HGBUR SMALL (A) 05/21/2019 1508   BILIRUBINUR NEGATIVE 05/21/2019 1508   BILIRUBINUR Negative 05/02/2017 1724   KETONESUR 5 (A) 05/21/2019 1508   PROTEINUR >=300 (A) 05/21/2019 1508   UROBILINOGEN 1.0 02/10/2012 0817   NITRITE NEGATIVE 05/21/2019 1508   LEUKOCYTESUR NEGATIVE 05/21/2019 1508    Recent Results (from the past 240 hour(s))  Urine culture     Status: Abnormal   Collection Time: 05/21/19  3:22 PM   Specimen: Urine, Clean Catch   Result Value Ref Range Status   Specimen Description   Final    URINE, CLEAN CATCH Performed at [facility], [address] [place], [place] [address]    Special Requests   Final    NONE Performed at [facility], [address], [place], [place] [address]    Culture (A)  Final    <10,000 COLONIES/mL INSIGNIFICANT GROWTH Performed at [facility], [place] [address] [place], [place] [address]    Report Status 05/22/2019 FINAL  Final  Culture, blood (routine x 2)     Status: None (Preliminary result)   Collection Time: 05/21/19  3:59 PM   Specimen: Left Antecubital; Blood  Result Value Ref Range Status   Specimen Description LEFT ANTECUBITAL  Final   Special Requests   Final    BOTTLES DRAWN AEROBIC AND ANAEROBIC Blood Culture adequate volume   Culture   Final    NO GROWTH < 24 HOURS Performed at [facility], [address], [place], [place] [address]    Report Status PENDING  Incomplete  Culture, blood (routine x 2)     Status: None (Preliminary result)   Collection Time: 05/21/19  4:12 PM   Specimen: Right Antecubital; Blood  Result Value Ref Range Status   Specimen Description RIGHT ANTECUBITAL  Final   Special Requests   Final    BOTTLES DRAWN AEROBIC AND ANAEROBIC Blood Culture adequate volume   Culture   Final    NO GROWTH < 24 HOURS Performed at [facility], [address] [place], [place] [address]    Report Status PENDING  Incomplete     Radiology Studies: Dg Chest Port 1 View  Result Date: 05/22/2019 CLINICAL DATA:  Shortness of breath and weakness. COVID-19 positive. EXAM: PORTABLE CHEST 1 VIEW COMPARISON:  05/21/2019 FINDINGS: The cardiac silhouette, mediastinal and hilar contours are stable. Persistent bilateral infiltrates and bilateral effusions. IMPRESSION: Persistent bilateral infiltrates and bilateral effusions. Electronically Signed   By: Marijo Sanes M.D.   On: 05/22/2019 07:36   Dg Chest Portable 1 View  Result Date: 05/21/2019 CLINICAL DATA:   Loss of consciousness. EXAM: PORTABLE CHEST 1 VIEW COMPARISON:  02/23/2018. FINDINGS: Mediastinum hilar structures normal. Cardiomegaly. Diffuse bilateral pulmonary infiltrates/edema and bilateral pleural effusions. Findings consistent CHF. Bilateral pneumonia cannot be excluded. Prior cervical spine fusion. IMPRESSION: Cardiomegaly with diffuse bilateral pulmonary infiltrates/edema and bilateral pleural effusions. Findings consistent with CHF. Electronically Signed   By: Marcello Moores  Register   On: 05/21/2019 15:38   Scheduled Meds: . aspirin EC  81 mg Oral Daily  . atorvastatin  40 mg Oral q1800  . carvedilol  12.5 mg Oral BID WC  . dexamethasone  6 mg Oral Daily  . enalapril  40 mg Oral Daily  . folic acid  1 mg Oral Daily  . insulin aspart  0-9 Units Subcutaneous TID WC  . insulin glargine  10 Units  Subcutaneous QHS  . sodium chloride flush  3 mL Intravenous Q12H   Continuous Infusions: . heparin 800 Units/hr (05/22/19 1545)  . remdesivir 100 mg in NS 100 mL Stopped (05/22/19 1400)     LOS: 1 day    Time spent: 35 minutes. Greater than 50% of this time was spent in direct contact with the patient, coordinating care and discussing relevant ongoing clinical issues, including discussion about COVID-19 infection, acute respiratory failure with hypoxia and elevated troponin, with need for IV heparin.Barton Dubois, MD [facility] Pager [phone]  05/22/2019, 4:46 PM

## 2019-05-22 NOTE — Progress Notes (Signed)
CRITICAL VALUE ALERT  Critical Value:  Troponin 171  Date & Time Notied:  05/22/2019 0730  Provider Notified: [place]  Orders Received/Actions taken: awaiting instructions

## 2019-05-23 LAB — CBC WITH DIFFERENTIAL/PLATELET
Abs Immature Granulocytes: 0.01 10*3/uL (ref 0.00–0.07)
Abs Immature Granulocytes: 0.01 10*3/uL (ref 0.00–0.07)
Basophils Absolute: 0 10*3/uL (ref 0.0–0.1)
Basophils Absolute: 0 10*3/uL (ref 0.0–0.1)
Basophils Relative: 0 %
Basophils Relative: 0 %
Eosinophils Absolute: 0 10*3/uL (ref 0.0–0.5)
Eosinophils Absolute: 0 10*3/uL (ref 0.0–0.5)
Eosinophils Relative: 0 %
Eosinophils Relative: 0 %
HCT: 34.9 % — ABNORMAL LOW (ref 36.0–46.0)
HCT: 35.5 % — ABNORMAL LOW (ref 36.0–46.0)
Hemoglobin: 10.9 g/dL — ABNORMAL LOW (ref 12.0–15.0)
Hemoglobin: 11.1 g/dL — ABNORMAL LOW (ref 12.0–15.0)
Immature Granulocytes: 0 %
Immature Granulocytes: 0 %
Lymphocytes Relative: 40 %
Lymphocytes Relative: 42 %
Lymphs Abs: 1.5 10*3/uL (ref 0.7–4.0)
Lymphs Abs: 1.7 10*3/uL (ref 0.7–4.0)
MCH: 26 pg (ref 26.0–34.0)
MCH: 26.1 pg (ref 26.0–34.0)
MCHC: 31.2 g/dL (ref 30.0–36.0)
MCHC: 31.3 g/dL (ref 30.0–36.0)
MCV: 83.3 fL (ref 80.0–100.0)
MCV: 83.3 fL (ref 80.0–100.0)
Monocytes Absolute: 0.5 10*3/uL (ref 0.1–1.0)
Monocytes Absolute: 0.4 10*3/uL (ref 0.1–1.0)
Monocytes Relative: 12 %
Monocytes Relative: 10 %
Neutro Abs: 1.8 10*3/uL (ref 1.7–7.7)
Neutro Abs: 2 10*3/uL (ref 1.7–7.7)
Neutrophils Relative %: 48 %
Neutrophils Relative %: 48 %
Platelets: 138 10*3/uL — ABNORMAL LOW (ref 150–400)
Platelets: 159 10*3/uL (ref 150–400)
RBC: 4.19 MIL/uL (ref 3.87–5.11)
RBC: 4.26 MIL/uL (ref 3.87–5.11)
RDW: 15.7 % — ABNORMAL HIGH (ref 11.5–15.5)
RDW: 15.8 % — ABNORMAL HIGH (ref 11.5–15.5)
WBC: 3.7 10*3/uL — ABNORMAL LOW (ref 4.0–10.5)
WBC: 4.1 10*3/uL (ref 4.0–10.5)
nRBC: 0 % (ref 0.0–0.2)
nRBC: 0 % (ref 0.0–0.2)

## 2019-05-23 LAB — GLUCOSE, CAPILLARY
Glucose-Capillary: 62 mg/dL — ABNORMAL LOW (ref 70–99)
Glucose-Capillary: 98 mg/dL (ref 70–99)
Glucose-Capillary: 110 mg/dL — ABNORMAL HIGH (ref 70–99)
Glucose-Capillary: 144 mg/dL — ABNORMAL HIGH (ref 70–99)

## 2019-05-23 LAB — HEPARIN LEVEL (UNFRACTIONATED): Heparin Unfractionated: 0.63 IU/mL (ref 0.30–0.70)

## 2019-05-23 MED ORDER — HEPARIN SODIUM (PORCINE) 5000 UNIT/ML IJ SOLN
5000.0000 [IU] | Freq: Three times a day (TID) | INTRAMUSCULAR | Status: DC
  Administered 2019-05-23 – 2019-05-25 (×6): 5000 [IU] via SUBCUTANEOUS
  Filled 2019-05-23 (×5): qty 1

## 2019-05-23 MED ORDER — INSULIN GLARGINE 100 UNIT/ML ~~LOC~~ SOLN
8.0000 [IU] | Freq: Every day | SUBCUTANEOUS | Status: DC
  Administered 2019-05-23 – 2019-05-24 (×2): 8 [IU] via SUBCUTANEOUS
  Filled 2019-05-23 (×3): qty 0.08

## 2019-05-23 NOTE — Progress Notes (Signed)
ANTICOAGULATION CONSULT NOTE -  Pharmacy Consult for Heparin Indication: NSTEMI  No Known Allergies  Patient Measurements: Height: 5\' 7"  (170.2 cm) Weight: 124 lb 9 oz (56.5 kg) IBW/kg (Calculated) : 61.6 Heparin Dosing Weight: estimated 60kg  Vital Signs: Temp: 98.4 F (36.9 C) (12/05 0602) Temp Source: Oral (12/05 0602) BP: 157/82 (12/05 0602) Pulse Rate: 69 (12/05 0602)  Labs: Recent Labs    05/21/19 1559 05/21/19 1754 05/21/19 1759 05/22/19 0614 05/22/19 1501 05/23/19 0653  HGB 11.3*  --   --  11.6*  --  10.9*  HCT 36.7  --   --  37.3  --  34.9*  PLT 178  --   --  164  --  138*  APTT  --   --  37*  --   --   --   LABPROT  --   --  15.0  --   --   --   INR  --   --  1.2  --   --   --   HEPARINUNFRC  --   --   --  0.55 0.93* 0.63  CREATININE 1.43*  --   --  1.42*  --   --   TROPONINIHS 192* 181*  --  171*  --   --     Estimated Creatinine Clearance: 31.9 mL/min (A) (by C-G formula based on SCr of 1.42 mg/dL (H)).   Medical History: Past Medical History:  Diagnosis Date  . Arthritis   . Cataract   . Coronary artery disease    Mild plaque 2012  . Diabetes mellitus   . Essential hypertension 02/10/2012  . GERD (gastroesophageal reflux disease)   . Hyperlipidemia   . Hypertension   . Neuropathy   . Peptic ulcer   . Rheumatoid arthritis ([place])   . TIA (transient ischemic attack) 02/10/2012    Medications:  Scheduled:  . aspirin EC  81 mg Oral Daily  . atorvastatin  40 mg Oral q1800  . carvedilol  12.5 mg Oral BID WC  . dexamethasone  6 mg Oral Daily  . enalapril  40 mg Oral Daily  . folic acid  1 mg Oral Daily  . insulin aspart  0-9 Units Subcutaneous TID WC  . insulin glargine  10 Units Subcutaneous QHS  . sodium chloride flush  3 mL Intravenous Q12H   Infusions:  . heparin 500 Units/hr (05/22/19 1806)  . remdesivir 100 mg in NS 100 mL Stopped (05/22/19 1400)   PRN: acetaminophen **OR** acetaminophen  Assessment: 73 yo female with COVID  pneumonia.  Patient with history of CAD, now with concern for type 2 NSTEMI, troponins 192, 181.  Pharmacy consulted heparin drip WITHOUT initial bolus.  Hgb 11.6, Plts wnl.   No anticoagulant use prior to admission.  Heparin level therapeutic at 0.63  Goal of Therapy:  Heparin level 0.3-0.7 units/ml Monitor platelets by anticoagulation protocol: Yes   Plan:  Continue heparin IV infusion at 500 units/hr Check heparin level in AM Daily heparin level and CBC  Thomasenia Sales, PharmD, MBA, BCGP Clinical Pharmacist  05/23/2019 8:26 AM

## 2019-05-23 NOTE — Progress Notes (Addendum)
PROGRESS NOTE    Erica Crosby  TDH:[id number] DOB: Mar 15, 1946 DOA: 05/21/2019 PCP: [name], DO     Brief Narrative:  As per H&P written by Dr. Velia Meyer on 05/21/19 Erica Crosby is a 73 y.o. female with medical history significant for chronic diastolic heart failure, stage III chronic kidney disease with baseline creatinine 1.2-1.4, rheumatoid arthritis on chronic immunosuppressive therapy with Humira and leflunomide, type 2 diabetes mellitus complicated by peripheral poly-neuropathy hypertension, hyperlipidemia, who was admitted to [facility] on 05/21/2019 with COVID pneumonia after presenting from home to [facility] emergency department for evaluation of lethargy. found with sepsis due to COVID 19 infection and NSTEMI/demand ischemia.   Assessment & Plan: 1-pneumonia due to COVID-19 virus -Patient has met criteria for the use of IV steroids and remdesivir. -Holding on antibiotics at this moment in the setting of low procalcitonin and most likely viral driven infection. -Continue IV fluids and supportive care -continue to Wean oxygen supplementation as tolerated. -Flutter valve and incentive spirometer has been ordered  2-sepsis secondary to Covid infection -Patient had met sepsis criteria at time of admission -Continue treatment for COVID-19 as mentioned above -will Continue IV fluids and supportive care -continue As needed antipyretics, analgesics and antiemetics. -No need for antibiotics currently.  3-Uncontrolled/poorly type 2 diabetes mellitus with hyperglycemia, diabetic nephropathy, and long-term current use of insulin (HCC)  -Anticipate an increase on CBGs with the use of steroids -Continue sliding scale insulin and Lantus -Most recent A1c 8.3. -Follow CBGs and further adjust hypoglycemic regimen as needed.  4-acute metabolic encephalopathy -In the setting of sepsis/Covid infection -No focal neurologic deficit appreciated -Continue treatment as  mentioned above and supportive care -Patient mentation is improving; more interactive today and oriented x2.   -Follow clinical response.  5-chronic kidney disease a stage IIIb -In the setting of diabetes and hypertension. -Creatinine appears to be stable and at baseline at this moment. -Continue fluid resuscitation and minimize the use of nephrotoxic agents -Follow renal function trend.  6-RA (rheumatoid arthritis) (HCC) -Continue holding immunosuppressant agents in the setting of acute infection. -As needed pain medication has been ordered. -Continue outpatient follow-up with rheumatology service as an outpatient.  7-Hypokalemia and Hypomagnesemia -In the setting of poor oral intake and dehydration -Continue electrolytes repletion -Follow electrolytes trend -Continue supportive care, fluid resuscitation and continue advancing/encouraging adequate diet as tolerated.  8-Elevated troponin: Type I NSTEMI/demand ischemia -Patient denies chest pain -Shortness of breath most likely driven by an active/acute COVID-19 pneumonia -2D echo results demonstrating preserved ejection fraction, grade 2 diastolic dysfunction and moderate mitral valve regurgitation.  No wall motion abnormalities appreciated. -Heparin drip discontinue after 48 hours -Continue beta-blocker, ACE inhibitors and statins. -EKG and telemetry not demonstrating acute ischemic changes. -continue telemetry monitoring for now.  9-chronic diastolic heart failure -Appears to be compensated -Will follow 2D echo results; prior echo from September 2017 demonstrated an ejection fraction of 65 to 70% with a grade 1 diastolic dysfunction. -Continue to follow daily weights and strict I's and O's -Continue judicious fluid resuscitation at this point.    DVT prophylaxis: heparin  Code Status: Full code Family Communication: No family at bedside. Disposition Plan: Remains inpatient, continue IV steroids and remdesivir.  Continue  holding on antibiotics.  Continue supportive care and follow clinical response.    Consultants:   None  Procedures:   See below for x-ray reports  2D echo: No wall motion and normalities, preserved ejection fraction, moderate mitral regurgitation.  Antimicrobials:  Anti-infectives (From  admission, onward)   Start     Dose/Rate Route Frequency Ordered Stop   05/22/19 1000  remdesivir 100 mg in sodium chloride 0.9 % 100 mL IVPB  Status:  Discontinued     100 mg 200 mL/hr over 30 Minutes Intravenous Daily 05/21/19 2123 05/22/19 0946   05/22/19 1000  remdesivir 100 mg in sodium chloride 0.9 % 100 mL IVPB     100 mg 200 mL/hr over 30 Minutes Intravenous Daily 05/22/19 0946 05/26/19 0959   05/21/19 2200  remdesivir 200 mg in sodium chloride 0.9% 250 mL IVPB     200 mg 580 mL/hr over 30 Minutes Intravenous Once 05/21/19 2123 05/22/19 0019       Subjective: Afebrile currently, no chest pain, no nausea, no vomiting.  Patient reports breathing easier and feeling slightly better.  Still requiring oxygen supplementation, expressing weakness and deconditioning.  Objective: Vitals:   05/22/19 0439 05/22/19 1300 05/22/19 2212 05/23/19 0602  BP: (!) 164/79 (!) 185/74 139/79 (!) 157/82  Pulse: 72 72 67 69  Resp: '16 16 16 18  ' Temp: 98.1 F (36.7 C) 98.7 F (37.1 C) 98.2 F (36.8 C) 98.4 F (36.9 C)  TempSrc: Oral Oral Oral Oral  SpO2: 92% 94% 98% 100%  Weight:    56.5 kg  Height:        Intake/Output Summary (Last 24 hours) at 05/23/2019 1241 Last data filed at 05/23/2019 1100 Gross per 24 hour  Intake 437.73 ml  Output --  Net 437.73 ml   Filed Weights   05/21/19 2352 05/23/19 0602  Weight: 57.8 kg 56.5 kg    Examination: General exam: Alert, awake, oriented x 2; no chest pain, no nausea, no vomiting.  Reports feeling short of breath and is requiring oxygen supplementation.  Currently afebrile.  Appetite still poor. Respiratory system: Positive rhonchi; no frank crackles or  expiratory wheezing at this moment.  No using accessory muscles.  Using 2 L nasal cannula supplementation.  Cardiovascular system:RRR. No murmurs, rubs, gallops.  No JVD on exam. Gastrointestinal system: Abdomen is nondistended, soft and nontender. No organomegaly or masses felt. Normal bowel sounds heard. Central nervous system: Alert and oriented X 2. No focal neurological deficits. Extremities: No cyanosis or clubbing; no edema. Skin: No open wounds, no petechiae. Psychiatry: Mood & affect appropriate.    Data Reviewed: I have personally reviewed following labs and imaging studies  CBC: Recent Labs  Lab 05/21/19 1559 05/22/19 0614 05/23/19 0653 05/23/19 1007  WBC 2.6* 2.2* 3.7* 4.1  NEUTROABS 1.4*  --  1.8 2.0  HGB 11.3* 11.6* 10.9* 11.1*  HCT 36.7 37.3 34.9* 35.5*  MCV 84.4 83.6 83.3 83.3  PLT 178 164 138* 315   Basic Metabolic Panel: Recent Labs  Lab 05/21/19 1559 05/22/19 0614  NA 139 145  K 2.5* 3.1*  CL 101 104  CO2 27 28  GLUCOSE 244* 156*  BUN 13 12  CREATININE 1.43* 1.42*  CALCIUM 8.2* 8.1*  MG 1.6* 1.8   GFR: Estimated Creatinine Clearance: 31.9 mL/min (A) (by C-G formula based on SCr of 1.42 mg/dL (H)).   Liver Function Tests: Recent Labs  Lab 05/21/19 1559 05/22/19 0614  AST 34 33  ALT 14 13  ALKPHOS 67 62  BILITOT 0.8 0.9  PROT 6.7 6.3*  ALBUMIN 2.9* 2.6*   Coagulation Profile: Recent Labs  Lab 05/21/19 1759  INR 1.2   CBG: Recent Labs  Lab 05/22/19 1102 05/22/19 1607 05/22/19 2213 05/23/19 0757 05/23/19 1109  GLUCAP 132*  103* 94 62* 98   Lipid Profile: Recent Labs    05/21/19 1559  TRIG 101   Thyroid Function Tests: Recent Labs    05/22/19 0614  TSH 0.365   Anemia Panel: Recent Labs    05/21/19 1559 05/22/19 0614  FERRITIN 122 117   Urine analysis:    Component Value Date/Time   COLORURINE YELLOW 05/21/2019 [place] 05/21/2019 1508   APPEARANCEUR Hazy (A) 05/02/2017 1724   LABSPEC 1.011  05/21/2019 1508   PHURINE 6.0 05/21/2019 1508   GLUCOSEU 50 (A) 05/21/2019 1508   HGBUR SMALL (A) 05/21/2019 1508   BILIRUBINUR NEGATIVE 05/21/2019 1508   BILIRUBINUR Negative 05/02/2017 1724   KETONESUR 5 (A) 05/21/2019 1508   PROTEINUR >=300 (A) 05/21/2019 1508   UROBILINOGEN 1.0 02/10/2012 0817   NITRITE NEGATIVE 05/21/2019 1508   LEUKOCYTESUR NEGATIVE 05/21/2019 1508    Recent Results (from the past 240 hour(s))  Urine culture     Status: Abnormal   Collection Time: 05/21/19  3:22 PM   Specimen: Urine, Clean Catch  Result Value Ref Range Status   Specimen Description   Final    URINE, CLEAN CATCH Performed at [facility], [address] [place], [place] [address]    Special Requests   Final    NONE Performed at [facility], [address], [place], [place] [address]    Culture (A)  Final    <10,000 COLONIES/mL INSIGNIFICANT GROWTH Performed at [facility], [place] [address], [place], [place] [address]    Report Status 05/22/2019 FINAL  Final  Culture, blood (routine x 2)     Status: None (Preliminary result)   Collection Time: 05/21/19  3:59 PM   Specimen: Left Antecubital; Blood  Result Value Ref Range Status   Specimen Description LEFT ANTECUBITAL  Final   Special Requests   Final    BOTTLES DRAWN AEROBIC AND ANAEROBIC Blood Culture adequate volume   Culture   Final    NO GROWTH 2 DAYS Performed at [facility], [address], [place], [place] [address]    Report Status PENDING  Incomplete  Culture, blood (routine x 2)     Status: None (Preliminary result)   Collection Time: 05/21/19  4:12 PM   Specimen: Right Antecubital; Blood  Result Value Ref Range Status   Specimen Description RIGHT ANTECUBITAL  Final   Special Requests   Final    BOTTLES DRAWN AEROBIC AND ANAEROBIC Blood Culture adequate volume   Culture   Final    NO GROWTH 2 DAYS Performed at [facility], [address], [place],  [address]    Report Status PENDING  Incomplete      Radiology Studies: Dg Chest Port 1 View  Result Date: 05/22/2019 CLINICAL DATA:  Shortness of breath and weakness. COVID-19 positive. EXAM: PORTABLE CHEST 1 VIEW COMPARISON:  05/21/2019 FINDINGS: The cardiac silhouette, mediastinal and hilar contours are stable. Persistent bilateral infiltrates and bilateral effusions. IMPRESSION: Persistent bilateral infiltrates and bilateral effusions. Electronically Signed   By: Marijo Sanes M.D.   On: 05/22/2019 07:36   Dg Chest Portable 1 View  Result Date: 05/21/2019 CLINICAL DATA:  Loss of consciousness. EXAM: PORTABLE CHEST 1 VIEW COMPARISON:  02/23/2018. FINDINGS: Mediastinum hilar structures normal. Cardiomegaly. Diffuse bilateral pulmonary infiltrates/edema and bilateral pleural effusions. Findings consistent CHF. Bilateral pneumonia cannot be excluded. Prior cervical spine fusion. IMPRESSION: Cardiomegaly with diffuse bilateral pulmonary infiltrates/edema and bilateral pleural effusions. Findings consistent with CHF. Electronically Signed   By:  [place]   On: 05/21/2019 15:38   Scheduled Meds:  aspirin EC  81 mg Oral Daily   atorvastatin  40 mg Oral q1800   carvedilol  12.5 mg Oral BID WC   dexamethasone  6 mg Oral Daily   enalapril  40 mg Oral Daily   folic acid  1 mg Oral Daily   heparin injection (subcutaneous)  5,000 Units Subcutaneous Q8H   insulin aspart  0-9 Units Subcutaneous TID WC   insulin glargine  8 Units Subcutaneous QHS   sodium chloride flush  3 mL Intravenous Q12H   Continuous Infusions:  remdesivir 100 mg in NS 100 mL 100 mg (05/23/19 1048)     LOS: 2 days    Time spent: 35 minutes.    Barton Dubois, MD [facility] Pager [phone]  05/23/2019, 12:41 PM

## 2019-05-24 DIAGNOSIS — E876 Hypokalemia: Secondary | ICD-10-CM

## 2019-05-24 DIAGNOSIS — J9601 Acute respiratory failure with hypoxia: Secondary | ICD-10-CM

## 2019-05-24 DIAGNOSIS — U071 COVID-19: Secondary | ICD-10-CM

## 2019-05-24 DIAGNOSIS — R778 Other specified abnormalities of plasma proteins: Secondary | ICD-10-CM

## 2019-05-24 DIAGNOSIS — G9341 Metabolic encephalopathy: Secondary | ICD-10-CM

## 2019-05-24 DIAGNOSIS — J1289 Other viral pneumonia: Secondary | ICD-10-CM

## 2019-05-24 LAB — COMPREHENSIVE METABOLIC PANEL
ALT: 15 U/L (ref 0–44)
AST: 40 U/L (ref 15–41)
Albumin: 2.5 g/dL — ABNORMAL LOW (ref 3.5–5.0)
Alkaline Phosphatase: 57 U/L (ref 38–126)
Anion gap: 12 (ref 5–15)
BUN: 30 mg/dL — ABNORMAL HIGH (ref 8–23)
CO2: 28 mmol/L (ref 22–32)
Calcium: 7.7 mg/dL — ABNORMAL LOW (ref 8.9–10.3)
Chloride: 99 mmol/L (ref 98–111)
Creatinine, Ser: 1.44 mg/dL — ABNORMAL HIGH (ref 0.44–1.00)
GFR calc Af Amer: 42 mL/min — ABNORMAL LOW (ref >60)
GFR calc non Af Amer: 36 mL/min — ABNORMAL LOW (ref >60)
Glucose, Bld: 88 mg/dL (ref 70–99)
Potassium: 3.2 mmol/L — ABNORMAL LOW (ref 3.5–5.1)
Sodium: 139 mmol/L (ref 135–145)
Total Bilirubin: 0.7 mg/dL (ref 0.3–1.2)
Total Protein: 6 g/dL — ABNORMAL LOW (ref 6.5–8.1)

## 2019-05-24 LAB — GLUCOSE, CAPILLARY
Glucose-Capillary: 74 mg/dL (ref 70–99)
Glucose-Capillary: 146 mg/dL — ABNORMAL HIGH (ref 70–99)
Glucose-Capillary: 148 mg/dL — ABNORMAL HIGH (ref 70–99)
Glucose-Capillary: 206 mg/dL — ABNORMAL HIGH (ref 70–99)

## 2019-05-24 LAB — C-REACTIVE PROTEIN: CRP: 0.9 mg/dL (ref <1.0)

## 2019-05-24 MED ORDER — POTASSIUM CHLORIDE CRYS ER 20 MEQ PO TBCR
40.0000 meq | EXTENDED_RELEASE_TABLET | Freq: Once | ORAL | Status: AC
  Administered 2019-05-24: 40 meq via ORAL
  Filled 2019-05-24: qty 2

## 2019-05-24 MED ORDER — MAGNESIUM SULFATE 2 GM/50ML IV SOLN
2.0000 g | Freq: Once | INTRAVENOUS | Status: AC
  Administered 2019-05-24: 12:00:00 2 g via INTRAVENOUS
  Filled 2019-05-24: qty 50

## 2019-05-24 NOTE — Progress Notes (Signed)
PROGRESS NOTE  Erica Crosby WGY:[id number] DOB: 02-Jul-1945 DOA: 05/21/2019 PCP: [name], DO  Brief History:  As per H&P written by Dr. Velia Meyer on 05/21/19 Erica Crosby a 73 y.o.femalewith medical history significant forchronic diastolic heart failure, stage III chronic kidney disease with baseline creatinine 1.2-1.4,rheumatoid arthritis on chronic immunosuppressive therapy with Humira andleflunomide,type 2 diabetes mellitus complicated by peripheral poly-neuropathy hypertension, hyperlipidemia, who was admitted to [facility] on 05/21/2019 withCOVID pneumoniaafter [facility] departmentforevaluation of lethargy.found with sepsis due to COVID 19 infection and NSTEMI/demand ischemia.   Assessment/Plan: Acute Respiratory Failure due to COVID-19 -met criteria for the use of IV steroids and remdesivir. -Holding on antibiotics at this moment in the setting of low procalcitonin  -wean oxygen to RA  sepsis  -secondary to Covid infection -met sepsis criteria at time of admission -Continue treatment for COVID-19 as mentioned above -PCT <0.10  Uncontrolled/poorly type 2 diabetes mellitus with hyperglycemia, diabetic nephropathy, and long-term current use of insulin (HCC)  -Anticipate an increase on CBGs with the use of steroids -Continue sliding scale insulin and Lantus -03/11/19 A1c 8.3. -CBGs controlled during hospitalization  acute metabolic encephalopathy -In the setting of sepsis/Covid infection -No focal neurologic deficit appreciated -improving, now near baseline  chronic kidney disease a stage IIIb -baseline creatinine 1.1-1.4 -Creatinine appears to be stable and at baseline at this moment. -Continue fluid resuscitation and minimize the use of nephrotoxic agents  RA (rheumatoid arthritis) -holding Humira and Arava. -As needed pain medication has been ordered. -outpatient follow-up with rheumatology  service as an outpatient.  Hypokalemia and Hypomagnesemia -Continue electrolytes repletion -Follow electrolytes trend  Elevated troponin: -due to demand ischemia -Patient denies chest pain -2D echo results demonstrating preserved ejection fraction, grade 2 diastolic dysfunction and moderate mitral valve regurgitation.  No wall motion abnormalities appreciated. -Received Heparin drip x 48 hours during hospitalization -Continue beta-blocker, ACE inhibitors and statins. -personally reviewed EKG--LVH repolarization abnormalities  chronic diastolic heart failure -Appears to be compensated -12/4 Echo--EF 55-60%, G2DD, mod MR, trivial pericardial effusion -Continue to follow daily weights and strict I's and O's        Disposition Plan:   Home 12/7 if stable Family Communication:   Daughter updated on phone 12/6  Consultants:  none  Code Status:  FULL   DVT Prophylaxis:  [place] Heparin    Procedures: As Listed in Progress Note Above  Antibiotics: None      Subjective: Patient denies fevers, chills, headache, chest pain, dyspnea, nausea, vomiting, diarrhea, abdominal pain, dysuria, hematuria, hematochezia, and melena.   Objective: Vitals:   05/23/19 2100 05/23/19 2233 05/24/19 0500 05/24/19 0535  BP:  (!) 153/82  (!) 157/81  Pulse: 75 70  73  Resp: _0 Temp:  98.2 F (36.8 C)  98.4 F (36.9 C)  TempSrc:  Oral  Oral  SpO2: 95% 100%  100%  Weight:   56.8 kg   Height:        Intake/Output Summary (Last 24 hours) at 05/24/2019 0915 Last data filed at 05/24/2019 0600 Gross per 24 hour  Intake 420 ml  Output 800 ml  Net -380 ml   Weight change: 0.3 kg Exam:   General:  Pt is alert, follows commands appropriately, not in acute distress  HEENT: No icterus, No thrush, No neck mass, [place]/AT  Cardiovascular: RRR, S1/S2, no rubs, no gallops  Respiratory: bibasilar rales. No wheeze  Abdomen:  Soft/+BS, non tender, non distended, no guarding   Extremities: No edema, No lymphangitis, No petechiae, No rashes, no synovitis   Data Reviewed: I have personally reviewed following labs and imaging studies Basic Metabolic Panel: Recent Labs  Lab 05/21/19 1559 05/22/19 0614 05/24/19 0647  NA 139 145 139  K 2.5* 3.1* 3.2*  CL 101 104 99  CO2 _0 GLUCOSE 244* 156* 88  BUN 13 12 30*  CREATININE 1.43* 1.42* 1.44*  CALCIUM 8.2* 8.1* 7.7*  MG 1.6* 1.8  --    Liver Function Tests: Recent Labs  Lab 05/21/19 1559 05/22/19 0614 05/24/19 0647  AST 34 33 40  ALT _1 ALKPHOS 67 62 57  BILITOT 0.8 0.9 0.7  PROT 6.7 6.3* 6.0*  ALBUMIN 2.9* 2.6* 2.5*   No results for input(s): LIPASE, AMYLASE in the last 168 hours. No results for input(s): AMMONIA in the last 168 hours. Coagulation Profile: Recent Labs  Lab 05/21/19 1759  INR 1.2   CBC: Recent Labs  Lab 05/21/19 1559 05/22/19 0614 05/23/19 0653 05/23/19 1007  WBC 2.6* 2.2* 3.7* 4.1  NEUTROABS 1.4*  --  1.8 2.0  HGB 11.3* 11.6* 10.9* 11.1*  HCT 36.7 37.3 34.9* 35.5*  MCV 84.4 83.6 83.3 83.3  PLT 178 164 138* 159   Cardiac Enzymes: No results for input(s): CKTOTAL, CKMB, CKMBINDEX, TROPONINI in the last 168 hours. BNP: Invalid input(s): POCBNP CBG: Recent Labs  Lab 05/23/19 0757 05/23/19 1109 05/23/19 1636 05/23/19 2234 05/24/19 0753  GLUCAP 62* 98 110* 144* 74   HbA1C: No results for input(s): HGBA1C in the last 72 hours. Urine analysis:    Component Value Date/Time   COLORURINE YELLOW 05/21/2019 1508   APPEARANCEUR CLEAR 05/21/2019 1508   APPEARANCEUR Hazy (A) 05/02/2017 1724   LABSPEC 1.011 05/21/2019 1508   PHURINE 6.0 05/21/2019 1508   GLUCOSEU 50 (A) 05/21/2019 1508   HGBUR SMALL (A) 05/21/2019 1508   BILIRUBINUR NEGATIVE 05/21/2019 1508   BILIRUBINUR Negative 05/02/2017 1724   KETONESUR 5 (A) 05/21/2019 1508   PROTEINUR >=300 (A) 05/21/2019 1508   UROBILINOGEN 1.0 02/10/2012 0817   NITRITE NEGATIVE 05/21/2019 1508    LEUKOCYTESUR NEGATIVE 05/21/2019 1508   Sepsis Labs: _2 (procalcitonin:4,lacticidven:4) ) Recent Results (from the past 240 hour(s))  Urine culture     Status: Abnormal   Collection Time: 05/21/19  3:22 PM   Specimen: Urine, Clean Catch  Result Value Ref Range Status   Specimen Description   Final    URINE, CLEAN CATCH Performed at [facility], [address], [place], [place] [address]    Special Requests   Final    NONE Performed at [facility], [address], [place], [place] [address]    Culture (A)  Final    <10,000 COLONIES/mL INSIGNIFICANT GROWTH Performed at [facility], [place] [address], [place], [place] [address]    Report Status 05/22/2019 FINAL  Final  Culture, blood (routine x 2)     Status: None (Preliminary result)   Collection Time: 05/21/19  3:59 PM   Specimen: Left Antecubital; Blood  Result Value Ref Range Status   Specimen Description LEFT ANTECUBITAL  Final   Special Requests   Final    BOTTLES DRAWN AEROBIC AND ANAEROBIC Blood Culture adequate volume   Culture   Final    NO GROWTH 3 DAYS Performed at [facility], [address], [place], [place] [address]    Report Status PENDING  Incomplete  Culture, blood (  routine x 2)     Status: None (Preliminary result)   Collection Time: 05/21/19  4:12 PM   Specimen: Right Antecubital; Blood  Result Value Ref Range Status   Specimen Description RIGHT ANTECUBITAL  Final   Special Requests   Final    BOTTLES DRAWN AEROBIC AND ANAEROBIC Blood Culture adequate volume   Culture   Final    NO GROWTH 3 DAYS Performed at [facility], [address], [place], [place] [address]    Report Status PENDING  Incomplete     Scheduled Meds: . aspirin EC  81 mg Oral Daily  . atorvastatin  40 mg Oral q1800  . carvedilol  12.5 mg Oral BID WC  . dexamethasone  6 mg Oral Daily  . enalapril  40 mg Oral Daily  . folic acid  1 mg Oral Daily  . heparin injection (subcutaneous)  5,000 Units Subcutaneous Q8H  .  insulin aspart  0-9 Units Subcutaneous TID WC  . insulin glargine  8 Units Subcutaneous QHS  . sodium chloride flush  3 mL Intravenous Q12H   Continuous Infusions: . remdesivir 100 mg in NS 100 mL 100 mg (05/23/19 1048)    Procedures/Studies: Dg Chest Port 1 View  Result Date: 05/22/2019 CLINICAL DATA:  Shortness of breath and weakness. COVID-19 positive. EXAM: PORTABLE CHEST 1 VIEW COMPARISON:  05/21/2019 FINDINGS: The cardiac silhouette, mediastinal and hilar contours are stable. Persistent bilateral infiltrates and bilateral effusions. IMPRESSION: Persistent bilateral infiltrates and bilateral effusions. Electronically Signed   By: Marijo Sanes M.D.   On: 05/22/2019 07:36   Dg Chest Portable 1 View  Result Date: 05/21/2019 CLINICAL DATA:  Loss of consciousness. EXAM: PORTABLE CHEST 1 VIEW COMPARISON:  02/23/2018. FINDINGS: Mediastinum hilar structures normal. Cardiomegaly. Diffuse bilateral pulmonary infiltrates/edema and bilateral pleural effusions. Findings consistent CHF. Bilateral pneumonia cannot be excluded. Prior cervical spine fusion. IMPRESSION: Cardiomegaly with diffuse bilateral pulmonary infiltrates/edema and bilateral pleural effusions. Findings consistent with CHF. Electronically Signed   By: Marcello Moores  Register   On: 05/21/2019 15:38    Orson Caedence, DO  [facility] Pager [phone]  If 7PM-7AM, please contact night-coverage [email or web address] Password [email or web address] 05/24/2019, 9:15 AM   LOS: 3 days

## 2019-05-24 NOTE — Plan of Care (Signed)

## 2019-05-24 NOTE — Discharge Summary (Signed)
Physician Discharge Summary  Erica Crosby JWJ:[id number] DOB: Jun 03, 1946 DOA: 05/21/2019  PCP: Erica Norlander, DO  Admit date: 05/21/2019 Discharge date: 05/25/2019  Admitted From: Home Disposition:  Home   Recommendations for Outpatient Follow-up:  1. Follow up with PCP in 1-2 weeks 2. Please obtain BMP/CBC in one week     Discharge Condition: Stable CODE STATUS: FULL Diet recommendation: Heart Healthy/Carb Modified   Brief/Interim Summary: As per H&P written by Dr. Velia Meyer on 05/21/19 Erica Crosby a 73 y.o.femalewith medical history significant forchronic diastolic heart failure, stage III chronic kidney disease with baseline creatinine 1.2-1.4,rheumatoid arthritis on chronic immunosuppressive therapy with Humira andleflunomide,type 2 diabetes mellitus complicated by peripheral poly-neuropathy hypertension, hyperlipidemia, who was [facility] to [facility] on 05/21/2019 withCOVID pneumoniaafter [facility].found with sepsis due to COVID 19 infection and NSTEMI/demand ischemia.  She is a former smoker having smoked about half pack per day for 25 years before completely quitting in 2014.   ED Course: Vital signs in the emergency department were notable for the following: Temperature max 99.6; heart rate ranged from 85-100; blood pressure ranged from 156/96-170 5/89; respiratory rate 17-20; initial oxygen saturation noted to be 85% on room air, which is subsequently improved into the range of 95 to 97% on 2 L nasal cannula.  This is in the context of no baseline supplemental oxygen requirements.   Discharge Diagnoses:  Acute Respiratory Failure due to COVID-19 -met criteria for the use of IV steroidsand remdesivir. -Holding on antibiotics- -PCT<0.10 -weaned oxygen to RA -received 5 days remdesivir -d/c home with 5 more days dexamethasone to finish 10 day course  sepsis  -secondary to  Covid infection -met sepsis criteria at time of admission -Continue treatment for COVID-19  -sepsis physiology resolved -PCT <0.10  Uncontrolled/poorlytype 2 diabetes mellitus withhyperglycemia,diabetic nephropathy -Anticipate an increaseonCBGs with the use of steroids -Continue sliding scale insulin and Lantus -03/11/19 A1c 8.3. -CBGs controlled during hospitalization  acute metabolic encephalopathy -Due to epsis/Covid infection -No focal neurologic deficit appreciated -improving, now back to baseline  chronic kidney disease a stage IIIb -baseline creatinine 1.1-1.4 -Creatinine appears to be stableandat baseline at this moment. -Continue fluid resuscitationandminimize the use of nephrotoxic agents -serum creatinine 1.42 on day of d/c  RA (rheumatoid arthritis) -holding Humira and Arava during hospitalization -As needed pain medication has been ordered. -outpatient follow-up with rheumatology service as an outpatient.  Hypokalemia and Hypomagnesemia -Continue electrolytes repletion -Follow electrolytes trend  Elevated troponin: -due to demand ischemia -Patient denies chest pain -2D echo results demonstrating preserved ejection fraction, grade 2 diastolic dysfunction and moderate mitral valve regurgitation. No wall motion abnormalities appreciated. -Received Heparin drip x 48 hours during hospitalization -Continue coreg and statins. -personally reviewed EKG--LVH repolarization abnormalities  chronic diastolic heart failure -Appears to be compensated -12/4 Echo--EF 55-60%, G2DD, mod MR, trivial pericardial effusion -Continue to follow daily weights and strict I's and O's     Discharge Instructions   Allergies as of 05/25/2019   No Known Allergies     Medication List    STOP taking these medications   potassium chloride SA 20 MEQ tablet Commonly known as: KLOR-CON     TAKE these medications   alendronate 70 MG tablet Commonly known as:  FOSAMAX Take 1 tablet by mouth once a week   aspirin EC 81 MG tablet Take 81 mg by mouth daily.   atorvastatin 10 MG tablet Commonly known as: LIPITOR Take 1 tablet (10 mg  total) by mouth daily.   carvedilol 12.5 MG tablet Commonly known as: COREG Take 1 tablet (12.5 mg total) by mouth 2 (two) times daily with a meal.   dexamethasone 6 MG tablet Commonly known as: DECADRON Take 1 tablet (6 mg total) by mouth daily. Start taking on: May 26, 2019   enalapril 20 MG tablet Commonly known as: VASOTEC Take 2 tablets by mouth once daily   folic acid 1 MG tablet Commonly known as: FOLVITE Take 1 tablet by mouth once daily   gabapentin 400 MG capsule Commonly known as: NEURONTIN Take 1 capsule by mouth at bedtime   Humira Pen 40 MG/0.4ML Pnkt Generic drug: Adalimumab Inject 40 mg into the skin every 14 (fourteen) days. Can restart 06/04/19 What changed: additional instructions   Insulin Glargine 100 UNIT/ML Solostar Pen Commonly known as: Lantus SoloStar INJECT 30 UNITS INTO THE SKIN AT BEDTIME What changed:   how much to take  how to take this  when to take this  additional instructions   leflunomide 20 MG tablet Commonly known as: ARAVA Take 1 tablet (20 mg total) by mouth daily. Can restart 06/04/19 What changed: additional instructions   pantoprazole 40 MG tablet Commonly known as: PROTONIX Take 1 tablet by mouth once daily   Vitamin D (Ergocalciferol) 1.25 MG (50000 UT) Caps capsule Commonly known as: DRISDOL Take 50,000 Units by mouth every 7 (seven) days. 8 total doses starting on 03/13/2019       No Known Allergies  Consultations:  none   Procedures/Studies: Dg Chest Port 1 View  Result Date: 05/22/2019 CLINICAL DATA:  Shortness of breath and weakness. COVID-19 positive. EXAM: PORTABLE CHEST 1 VIEW COMPARISON:  05/21/2019 FINDINGS: The cardiac silhouette, mediastinal and hilar contours are stable. Persistent bilateral infiltrates and  bilateral effusions. IMPRESSION: Persistent bilateral infiltrates and bilateral effusions. Electronically Signed   By: Marijo Sanes M.D.   On: 05/22/2019 07:36   Dg Chest Portable 1 View  Result Date: 05/21/2019 CLINICAL DATA:  Loss of consciousness. EXAM: PORTABLE CHEST 1 VIEW COMPARISON:  02/23/2018. FINDINGS: Mediastinum hilar structures normal. Cardiomegaly. Diffuse bilateral pulmonary infiltrates/edema and bilateral pleural effusions. Findings consistent CHF. Bilateral pneumonia cannot be excluded. Prior cervical spine fusion. IMPRESSION: Cardiomegaly with diffuse bilateral pulmonary infiltrates/edema and bilateral pleural effusions. Findings consistent with CHF. Electronically Signed   By: Marcello Moores  Register   On: 05/21/2019 15:38        Discharge Exam: Vitals:   05/24/19 2150 05/25/19 0618  BP: 134/74 (!) 152/80  Pulse: 73 68  Resp: 16 16  Temp: 98.5 F (36.9 C) 98.3 F (36.8 C)  SpO2: 99% 97%   Vitals:   05/24/19 2035 05/24/19 2150 05/25/19 0500 05/25/19 0618  BP:  134/74  (!) 152/80  Pulse: 74 73  68  Resp: _0 Temp:  98.5 F (36.9 C)  98.3 F (36.8 C)  TempSrc:  Oral  Oral  SpO2: 97% 99%  97%  Weight:   57.5 kg   Height:        General: Pt is alert, awake, not in acute distress Cardiovascular: RRR, S1/S2 +, no rubs, no gallops Respiratory: bibasilar crackles, no wheeze Abdominal: Soft, NT, ND, bowel sounds + Extremities: no edema, no cyanosis   The results of significant diagnostics from this hospitalization (including imaging, microbiology, ancillary and laboratory) are listed below for reference.    Significant Diagnostic Studies: Dg Chest Port 1 View  Result Date: 05/22/2019 CLINICAL DATA:  Shortness of breath and  weakness. COVID-19 positive. EXAM: PORTABLE CHEST 1 VIEW COMPARISON:  05/21/2019 FINDINGS: The cardiac silhouette, mediastinal and hilar contours are stable. Persistent bilateral infiltrates and bilateral effusions. IMPRESSION: Persistent  bilateral infiltrates and bilateral effusions. Electronically Signed   By: Marijo Sanes M.D.   On: 05/22/2019 07:36   Dg Chest Portable 1 View  Result Date: 05/21/2019 CLINICAL DATA:  Loss of consciousness. EXAM: PORTABLE CHEST 1 VIEW COMPARISON:  02/23/2018. FINDINGS: Mediastinum hilar structures normal. Cardiomegaly. Diffuse bilateral pulmonary infiltrates/edema and bilateral pleural effusions. Findings consistent CHF. Bilateral pneumonia cannot be excluded. Prior cervical spine fusion. IMPRESSION: Cardiomegaly with diffuse bilateral pulmonary infiltrates/edema and bilateral pleural effusions. Findings consistent with CHF. Electronically Signed   By: Marcello Moores  Register   On: 05/21/2019 15:38     Microbiology: Recent Results (from the past 240 hour(s))  Urine culture     Status: Abnormal   Collection Time: 05/21/19  3:22 PM   Specimen: Urine, Clean Catch  Result Value Ref Range Status   Specimen Description   Final    URINE, CLEAN CATCH Performed at [facility], [address] [place], [place] [address]    Special Requests   Final    NONE Performed at [facility], [address], [place], [place] [address]    Culture (A)  Final    <10,000 COLONIES/mL INSIGNIFICANT GROWTH Performed at [facility], [place] [address] [place], [place] [address]    Report Status 05/22/2019 FINAL  Final  Culture, blood (routine x 2)     Status: None (Preliminary result)   Collection Time: 05/21/19  3:59 PM   Specimen: Left Antecubital; Blood  Result Value Ref Range Status   Specimen Description LEFT ANTECUBITAL  Final   Special Requests   Final    BOTTLES DRAWN AEROBIC AND ANAEROBIC Blood Culture adequate volume   Culture   Final    NO GROWTH 4 DAYS Performed at [facility], [address] [place], [place] [address]    Report Status PENDING  Incomplete  Culture, blood (routine x 2)     Status: None (Preliminary result)   Collection Time: 05/21/19  4:12 PM   Specimen: Right Antecubital; Blood   Result Value Ref Range Status   Specimen Description RIGHT ANTECUBITAL  Final   Special Requests   Final    BOTTLES DRAWN AEROBIC AND ANAEROBIC Blood Culture adequate volume   Culture   Final    NO GROWTH 4 DAYS Performed at [facility] [address] [place], [place] [address]    Report Status PENDING  Incomplete     Labs: Basic Metabolic Panel: Recent Labs  Lab 05/21/19 1559 05/22/19 0614 05/24/19 0647 05/25/19 0557  NA 139 145 139 136  K 2.5* 3.1* 3.2* 3.2*  CL 101 104 99 99  CO2 _0 GLUCOSE 244* 156* 88 108*  BUN 13 12 30* 33*  CREATININE 1.43* 1.42* 1.44* 1.42*  CALCIUM 8.2* 8.1* 7.7* 7.9*  MG 1.6* 1.8  --  2.4   Liver Function Tests: Recent Labs  Lab 05/21/19 1559 05/22/19 0614 05/24/19 0647  AST 34 33 40  ALT _1 ALKPHOS 67 62 57  BILITOT 0.8 0.9 0.7  PROT 6.7 6.3* 6.0*  ALBUMIN 2.9* 2.6* 2.5*   No results for input(s): LIPASE, AMYLASE in the last 168 hours. No results for input(s): AMMONIA in the last 168 hours. CBC: Recent Labs  Lab 05/21/19 1559 05/22/19 0614 05/23/19 0653 05/23/19 1007 05/25/19 0557  WBC  2.6* 2.2* 3.7* 4.1 4.6  NEUTROABS 1.4*  --  1.8 2.0  --   HGB 11.3* 11.6* 10.9* 11.1* 11.0*  HCT 36.7 37.3 34.9* 35.5* 35.4*  MCV 84.4 83.6 83.3 83.3 81.9  PLT 178 164 138* 159 132*   Cardiac Enzymes: No results for input(s): CKTOTAL, CKMB, CKMBINDEX, TROPONINI in the last 168 hours. BNP: Invalid input(s): POCBNP CBG: Recent Labs  Lab 05/23/19 2234 05/24/19 0753 05/24/19 1100 05/24/19 1629 05/24/19 2148  GLUCAP 144* 74 146* 148* 206*    Time coordinating discharge:  36 minutes  Signed:  Orson Deola, DO [facility] Pager: [phone] 05/25/2019, 11:16 AM

## 2019-05-25 ENCOUNTER — Other Ambulatory Visit: Payer: Self-pay | Admitting: Family Medicine

## 2019-05-25 DIAGNOSIS — Z794 Long term (current) use of insulin: Secondary | ICD-10-CM

## 2019-05-25 DIAGNOSIS — E1121 Type 2 diabetes mellitus with diabetic nephropathy: Secondary | ICD-10-CM

## 2019-05-25 DIAGNOSIS — E1165 Type 2 diabetes mellitus with hyperglycemia: Secondary | ICD-10-CM

## 2019-05-25 DIAGNOSIS — E559 Vitamin D deficiency, unspecified: Secondary | ICD-10-CM

## 2019-05-25 LAB — CBC
HCT: 35.4 % — ABNORMAL LOW (ref 36.0–46.0)
Hemoglobin: 11 g/dL — ABNORMAL LOW (ref 12.0–15.0)
MCH: 25.5 pg — ABNORMAL LOW (ref 26.0–34.0)
MCHC: 31.1 g/dL (ref 30.0–36.0)
MCV: 81.9 fL (ref 80.0–100.0)
Platelets: 132 10*3/uL — ABNORMAL LOW (ref 150–400)
RBC: 4.32 MIL/uL (ref 3.87–5.11)
RDW: 15.5 % (ref 11.5–15.5)
WBC: 4.6 10*3/uL (ref 4.0–10.5)
nRBC: 0 % (ref 0.0–0.2)

## 2019-05-25 LAB — C-REACTIVE PROTEIN: CRP: 0.8 mg/dL (ref <1.0)

## 2019-05-25 LAB — BASIC METABOLIC PANEL
Anion gap: 10 (ref 5–15)
BUN: 33 mg/dL — ABNORMAL HIGH (ref 8–23)
CO2: 27 mmol/L (ref 22–32)
Calcium: 7.9 mg/dL — ABNORMAL LOW (ref 8.9–10.3)
Chloride: 99 mmol/L (ref 98–111)
Creatinine, Ser: 1.42 mg/dL — ABNORMAL HIGH (ref 0.44–1.00)
GFR calc Af Amer: 43 mL/min — ABNORMAL LOW (ref >60)
GFR calc non Af Amer: 37 mL/min — ABNORMAL LOW (ref >60)
Glucose, Bld: 108 mg/dL — ABNORMAL HIGH (ref 70–99)
Potassium: 3.2 mmol/L — ABNORMAL LOW (ref 3.5–5.1)
Sodium: 136 mmol/L (ref 135–145)

## 2019-05-25 LAB — FERRITIN: Ferritin: 215 ng/mL (ref 11–307)

## 2019-05-25 LAB — MAGNESIUM: Magnesium: 2.4 mg/dL (ref 1.7–2.4)

## 2019-05-25 MED ORDER — HUMIRA (2 PEN) 40 MG/0.4ML ~~LOC~~ AJKT: 40.0000 mg | AUTO-INJECTOR | SUBCUTANEOUS | Status: DC

## 2019-05-25 MED ORDER — LEFLUNOMIDE 20 MG PO TABS: 20.0000 mg | ORAL_TABLET | Freq: Every day | ORAL | Status: DC

## 2019-05-25 MED ORDER — DEXAMETHASONE 6 MG PO TABS: 6.0000 mg | ORAL_TABLET | Freq: Every day | ORAL | 0 refills | Status: DC

## 2019-05-25 MED ORDER — POTASSIUM CHLORIDE CRYS ER 20 MEQ PO TBCR
40.0000 meq | EXTENDED_RELEASE_TABLET | Freq: Once | ORAL | Status: AC
  Administered 2019-05-25: 11:00:00 40 meq via ORAL
  Filled 2019-05-25: qty 2

## 2019-05-25 NOTE — Progress Notes (Signed)
CSW attempted to contact patients daughter and son to conduct TOC assessment but was unsuccessful. HIPAA compliant VM left requesting call back.   CSW in contact with Blake Divine to put in referral for patient hospital bed. Referral accepted.   [facility]  Clinical Social Worker  Ph: [phone]

## 2019-05-25 NOTE — Care Management (Signed)
Patient requires frequent re-positioning of the body in ways that cannot be achieved with an ordinary bed or wedge pillow, to eliminate pain, reduce pressure, and the head of the bed to be elevated more than 30 degrees most of the time due to COVID positive, admitted Respiratory failure with hypoxia.  Patient also has Rheumatoid Arthritis.

## 2019-05-25 NOTE — Care Management Important Message (Signed)
Important Message  Patient Details  Name: Erica Crosby MRN: [id number] Date of Birth: [birth year]   Medicare Important Message Given:  Yes(given to RN to deliver due to contact precautions)     Tommy Medal 05/25/2019, 3:45 PM

## 2019-05-25 NOTE — Care Management Important Message (Signed)
Important Message  Patient Details  Name: Erica Crosby MRN: [id number] Date of Birth: Dec 17, 1945   Medicare Important Message Given:  Yes     Tommy Medal 05/25/2019, 10:58 AM

## 2019-05-26 LAB — CULTURE, BLOOD (ROUTINE X 2)
Culture: NO GROWTH
Culture: NO GROWTH
Special Requests: ADEQUATE
Special Requests: ADEQUATE

## 2019-05-28 ENCOUNTER — Encounter: Payer: Self-pay | Admitting: Family Medicine

## 2019-05-28 ENCOUNTER — Telehealth: Payer: Self-pay | Admitting: Family Medicine

## 2019-05-28 ENCOUNTER — Other Ambulatory Visit: Payer: Self-pay | Admitting: Family Medicine

## 2019-05-28 ENCOUNTER — Ambulatory Visit (INDEPENDENT_AMBULATORY_CARE_PROVIDER_SITE_OTHER): Payer: Medicare Other | Admitting: Family Medicine

## 2019-05-28 DIAGNOSIS — Z09 Encounter for follow-up examination after completed treatment for conditions other than malignant neoplasm: Secondary | ICD-10-CM | POA: Diagnosis not present

## 2019-05-28 DIAGNOSIS — E876 Hypokalemia: Secondary | ICD-10-CM | POA: Diagnosis not present

## 2019-05-28 DIAGNOSIS — J1289 Other viral pneumonia: Secondary | ICD-10-CM

## 2019-05-28 DIAGNOSIS — U071 COVID-19: Secondary | ICD-10-CM | POA: Diagnosis not present

## 2019-05-28 DIAGNOSIS — R5381 Other malaise: Secondary | ICD-10-CM | POA: Diagnosis not present

## 2019-05-28 MED ORDER — GABAPENTIN 400 MG PO CAPS: 400.0000 mg | ORAL_CAPSULE | Freq: Every day | ORAL | 0 refills | Status: DC

## 2019-05-28 MED ORDER — PANTOPRAZOLE SODIUM 40 MG PO TBEC: 40.0000 mg | DELAYED_RELEASE_TABLET | Freq: Every day | ORAL | 0 refills | Status: DC

## 2019-05-28 NOTE — Progress Notes (Signed)
Telephone visit  Subjective: CC: Hospital discharge follow-up PCP: [name], DO HPI:Erica Crosby is a 73 y.o. female calls for telephone consult today. Patient provides verbal consent for consult held via phone.  Location of patient: Home Location of provider: Working remotely from home Others present for call: Daughter  1.  Acute respiratory failure due to COVID-19 Patient was found to be septic secondary to COVID-19.  She was treated with IV steroids, remdesivir and antibiotics in the hospital.  She has known chronic kidney disease and coronary artery disease.  She had demand ischemia in the hospital.  At discharge, potassium was noted to be 3.2, creatinine 1.42 and hemoglobin 11.0.  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  They are asking to see if perhaps we can help get her some help at home.  Patient has difficulty getting around as she is still weak from recent severe illness.  She was not discharged home with home health.  She is having difficulty performing various tasks at home.  Though she does admit to feeling better than when she went into the hospital.  No fevers.   ROS: Per HPI  No Known Allergies Past Medical History:  Diagnosis Date  . Arthritis   . Cataract   . Coronary artery disease    Mild plaque 2012  . Diabetes mellitus   . Essential hypertension 02/10/2012  . GERD (gastroesophageal reflux disease)   . Hyperlipidemia   . Hypertension   . Neuropathy   . Peptic ulcer   . Rheumatoid arthritis ([place])   . TIA (transient ischemic attack) 02/10/2012    Current Outpatient Medications:  .  Adalimumab (HUMIRA PEN) 40 MG/0.4ML PNKT, Inject 40 mg into the skin every 14 (fourteen) days. Can restart 06/04/19, Disp: 2 each, Rfl:  .  alendronate (FOSAMAX) 70 MG tablet, Take 1 tablet by mouth once a week (Patient taking differently:  Take 70 mg by mouth once a week. ), Disp: 12 tablet, Rfl: 0 .  aspirin EC 81 MG tablet, Take 81 mg by mouth daily., Disp: , Rfl:  .  atorvastatin (LIPITOR) 10 MG tablet, Take 1 tablet (10 mg total) by mouth daily., Disp: 90 tablet, Rfl: 3 .  carvedilol (COREG) 12.5 MG tablet, Take 1 tablet (12.5 mg total) by mouth 2 (two) times daily with a meal., Disp: , Rfl:  .  dexamethasone (DECADRON) 6 MG tablet, Take 1 tablet (6 mg total) by mouth daily., Disp: 5 tablet, Rfl: 0 .  enalapril (VASOTEC) 20 MG tablet, Take 2 tablets by mouth once daily, Disp: 180 tablet, Rfl: 0 .  folic acid (FOLVITE) 1 MG tablet, Take 1 tablet by mouth once daily (Patient taking differently: Take 1 mg by mouth daily. ), Disp: 90 tablet, Rfl: 0 .  gabapentin (NEURONTIN) 400 MG capsule, Take 1 capsule (400 mg total) by mouth at bedtime., Disp: 90 capsule, Rfl: 0 .  Insulin Glargine (LANTUS SOLOSTAR) 100 UNIT/ML Solostar Pen, INJECT 30 UNITS INTO THE SKIN AT BEDTIME (Patient taking differently: Inject 30 Units into the skin at bedtime. ), Disp: 15 mL, Rfl: 2 .  leflunomide (ARAVA) 20 MG tablet, Take 1 tablet (20 mg total) by mouth daily. Can restart 06/04/19, Disp:  , Rfl:  .  OPTIMAL-D 1.25 MG (50000 UT) capsule, TAKE 1 CAPSULE BY MOUTH ONCE A WEEK FOR 8 DOSES,  Disp: 8 capsule, Rfl: 0 .  pantoprazole (PROTONIX) 40 MG tablet, Take 1 tablet (40 mg total) by mouth daily., Disp: 90 tablet, Rfl: 0 .  Vitamin D, Ergocalciferol, (DRISDOL) 1.25 MG (50000 UT) CAPS capsule, Take 50,000 Units by mouth every 7 (seven) days. 8 total doses starting on 03/13/2019, Disp: , Rfl:   Assessment/ Plan: 73 y.o. female   1. Hospital discharge follow-up I reviewed her discharge summary and recommendations.  We will need to obtain BMP and CBC at some point.  Hopefully at home health can collect these. - Ambulatory referral to Home Health  2. Pneumonia due to COVID-19 virus Resolving and status post treatment with steroid.  Would benefit from spot  checks of her O2 at home she does not have a pulse ox and her insurance will not pay for 1. - Ambulatory referral to [facility]  3. Hypokalemia Noted to be 3.2 at discharge.  She was discharged home with potassium.  Will need recheck - Ambulatory referral to [facility]  4. Physical deconditioning We will likely need home physical therapy as she is essentially lying around due to generalized weakness after severe illness. - Ambulatory referral to [facility] time: 3:22pm End time: 3:35pm  Total time spent on patient care (including telephone call/ virtual visit): 19 minutes  [place], [place] [phone]

## 2019-05-28 NOTE — Telephone Encounter (Signed)
Patient says she changed her pharmacy to [facility] and [facility] is requesting a copy of patients medications be sent to them.

## 2019-05-28 NOTE — Telephone Encounter (Signed)
Taken care of in another encounter 

## 2019-05-28 NOTE — Telephone Encounter (Signed)
What is the name of the medication? Dexamethasone 6 mg, Pantoprazole 40 mg, Gabapentin 400 mg and also needs a med list sent to them. They are going to start doing her medications every week  Have you contacted your pharmacy to request a refill? NO  Which pharmacy would you like this sent to? [facility]   Patient notified that their request is being sent to the clinical staff for review and that they should receive a call once it is complete. If they do not receive a call within 24 hours they can check with their pharmacy or our office.

## 2019-05-29 ENCOUNTER — Other Ambulatory Visit: Payer: Self-pay

## 2019-05-29 DIAGNOSIS — U071 COVID-19: Secondary | ICD-10-CM | POA: Diagnosis not present

## 2019-05-29 NOTE — Telephone Encounter (Signed)
Pharmacy aware and verbalizes understanding.  °

## 2019-05-29 NOTE — Telephone Encounter (Signed)
[facility] called requesting a refill of patients decadron sent to pharmacy since she is new with them.  Looks like it was filled from another provider- please advise

## 2019-05-29 NOTE — Telephone Encounter (Signed)
Refill is not appropriate.  She completed this course (it was from the ED). This is not a long term med.

## 2019-06-03 DIAGNOSIS — Z79899 Other long term (current) drug therapy: Secondary | ICD-10-CM | POA: Diagnosis not present

## 2019-06-03 DIAGNOSIS — M0510 Rheumatoid lung disease with rheumatoid arthritis of unspecified site: Secondary | ICD-10-CM | POA: Diagnosis not present

## 2019-06-03 DIAGNOSIS — M069 Rheumatoid arthritis, unspecified: Secondary | ICD-10-CM | POA: Diagnosis not present

## 2019-06-03 DIAGNOSIS — M810 Age-related osteoporosis without current pathological fracture: Secondary | ICD-10-CM | POA: Diagnosis not present

## 2019-06-03 DIAGNOSIS — E119 Type 2 diabetes mellitus without complications: Secondary | ICD-10-CM | POA: Diagnosis not present

## 2019-06-05 ENCOUNTER — Other Ambulatory Visit: Payer: Self-pay | Admitting: Internal Medicine

## 2019-06-05 DIAGNOSIS — U071 COVID-19: Secondary | ICD-10-CM

## 2019-06-15 ENCOUNTER — Ambulatory Visit (INDEPENDENT_AMBULATORY_CARE_PROVIDER_SITE_OTHER): Payer: Medicare Other | Admitting: Family Medicine

## 2019-06-15 ENCOUNTER — Encounter: Payer: Self-pay | Admitting: Family Medicine

## 2019-06-15 DIAGNOSIS — E162 Hypoglycemia, unspecified: Secondary | ICD-10-CM | POA: Diagnosis not present

## 2019-06-15 MED ORDER — ENALAPRIL MALEATE 20 MG PO TABS: 40.0000 mg | ORAL_TABLET | Freq: Every day | ORAL | 3 refills | Status: DC

## 2019-06-15 MED ORDER — LANTUS SOLOSTAR 100 UNIT/ML ~~LOC~~ SOPN: 20.0000 [IU] | PEN_INJECTOR | Freq: Every day | SUBCUTANEOUS | 2 refills | Status: DC

## 2019-06-15 MED ORDER — ALENDRONATE SODIUM 70 MG PO TABS: 70.0000 mg | ORAL_TABLET | ORAL | 0 refills | Status: DC

## 2019-06-15 NOTE — Progress Notes (Signed)
Telephone visit  Subjective: CC: 5-month follow-up PCP: [name], DO HPI:Erica Crosby is a 73 y.o. female calls for telephone consult today. Patient provides verbal consent for consult held via phone.  Due to COVID-19 pandemic this visit was conducted virtually. This visit type was conducted due to national recommendations for restrictions regarding the COVID-19 Pandemic (e.g. social distancing, sheltering in place) in an effort to limit this patient's exposure and mitigate transmission in our community. All issues noted in this document were discussed and addressed.  A physical exam was not performed with this format.   Location of patient: home Location of provider: [facility] Others present for call: daughter  1.  Type 2 diabetes Previously noted to be uncontrolled with A1c above 8.  Her daughter has been administering her insulin, 30 units nightly.  She has skipped the last several days because her mother's blood sugars were dropping into the 50s each morning and she was hard to arouse.  She was worried that she was being overmedicated.  She has not been monitoring the blood sugars in the last several days because she has not been giving the insulin and has been acting normally.  Additionally, the patient notes that her home health has not yet started but should be coming in after the first of the year.  Overall she feels well except for deconditioned state.   ROS: Per HPI  No Known Allergies Past Medical History:  Diagnosis Date  . Arthritis   . Cataract   . Coronary artery disease    Mild plaque 2012  . Diabetes mellitus   . Essential hypertension 02/10/2012  . GERD (gastroesophageal reflux disease)   . Hyperlipidemia   . Hypertension   . Neuropathy   . Peptic ulcer   . Rheumatoid arthritis ([place])   . TIA (transient ischemic attack) 02/10/2012    Current Outpatient Medications:  .  Adalimumab (HUMIRA PEN) 40 MG/0.4ML PNKT, Inject 40 mg into the skin every 14 (fourteen)  days. Can restart 06/04/19, Disp: 2 each, Rfl:  .  alendronate (FOSAMAX) 70 MG tablet, Take 1 tablet by mouth once a week (Patient taking differently: Take 70 mg by mouth once a week. ), Disp: 12 tablet, Rfl: 0 .  aspirin EC 81 MG tablet, Take 81 mg by mouth daily., Disp: , Rfl:  .  atorvastatin (LIPITOR) 10 MG tablet, Take 1 tablet (10 mg total) by mouth daily., Disp: 90 tablet, Rfl: 3 .  carvedilol (COREG) 12.5 MG tablet, Take 1 tablet (12.5 mg total) by mouth 2 (two) times daily with a meal., Disp: , Rfl:  .  enalapril (VASOTEC) 20 MG tablet, Take 2 tablets (40 mg total) by mouth daily., Disp: 180 tablet, Rfl: 3 .  folic acid (FOLVITE) 1 MG tablet, Take 1 tablet by mouth once daily (Patient taking differently: Take 1 mg by mouth daily. ), Disp: 90 tablet, Rfl: 0 .  gabapentin (NEURONTIN) 400 MG capsule, Take 1 capsule (400 mg total) by mouth at bedtime., Disp: 90 capsule, Rfl: 0 .  Insulin Glargine (LANTUS SOLOSTAR) 100 UNIT/ML Solostar Pen, Inject 20-30 Units into the skin at bedtime., Disp: 15 mL, Rfl: 2 .  leflunomide (ARAVA) 20 MG tablet, Take 1 tablet (20 mg total) by mouth daily. Can restart 06/04/19, Disp:  , Rfl:  .  OPTIMAL-D 1.25 MG (50000 UT) capsule, TAKE 1 CAPSULE BY MOUTH ONCE A WEEK FOR 8 DOSES, Disp: 8 capsule, Rfl: 0 .  pantoprazole (PROTONIX) 40 MG tablet, Take 1 tablet (  40 mg total) by mouth daily., Disp: 90 tablet, Rfl: 0  Assessment/ Plan: 73 y.o. female   1. Hypoglycemia With intermittent hypoglycemic episodes, presumably medication induced.  I recommended that they monitor her morning blood sugars fasting for the next few days.  If her blood sugars in the morning are above 150, I recommended that they drop the insulin to 20 units nightly and continue to monitor.  If blood sugars are less than 150 fasting then we will hold off on reinitiation of the insulin for now.  Her daughter voiced good understanding and we will plan to follow-up in 1 week via telephone.  Home health  is expected to start after the first of the year.   Start time: 2:35pm End time: 2:49pm  Total time spent on patient care (including telephone call/ virtual visit): 20 minutes  [place], [place] [phone]

## 2019-06-16 DIAGNOSIS — K273 Acute peptic ulcer, site unspecified, without hemorrhage or perforation: Secondary | ICD-10-CM | POA: Diagnosis not present

## 2019-06-16 DIAGNOSIS — Z9181 History of falling: Secondary | ICD-10-CM | POA: Diagnosis not present

## 2019-06-16 DIAGNOSIS — H269 Unspecified cataract: Secondary | ICD-10-CM | POA: Diagnosis not present

## 2019-06-16 DIAGNOSIS — M1990 Unspecified osteoarthritis, unspecified site: Secondary | ICD-10-CM | POA: Diagnosis not present

## 2019-06-16 DIAGNOSIS — Z794 Long term (current) use of insulin: Secondary | ICD-10-CM | POA: Diagnosis not present

## 2019-06-16 DIAGNOSIS — I129 Hypertensive chronic kidney disease with stage 1 through stage 4 chronic kidney disease, or unspecified chronic kidney disease: Secondary | ICD-10-CM | POA: Diagnosis not present

## 2019-06-16 DIAGNOSIS — U071 COVID-19: Secondary | ICD-10-CM | POA: Diagnosis not present

## 2019-06-16 DIAGNOSIS — E1122 Type 2 diabetes mellitus with diabetic chronic kidney disease: Secondary | ICD-10-CM | POA: Diagnosis not present

## 2019-06-16 DIAGNOSIS — E785 Hyperlipidemia, unspecified: Secondary | ICD-10-CM | POA: Diagnosis not present

## 2019-06-16 DIAGNOSIS — K219 Gastro-esophageal reflux disease without esophagitis: Secondary | ICD-10-CM | POA: Diagnosis not present

## 2019-06-16 DIAGNOSIS — R5383 Other fatigue: Secondary | ICD-10-CM | POA: Diagnosis not present

## 2019-06-16 DIAGNOSIS — N189 Chronic kidney disease, unspecified: Secondary | ICD-10-CM | POA: Diagnosis not present

## 2019-06-16 DIAGNOSIS — I2510 Atherosclerotic heart disease of native coronary artery without angina pectoris: Secondary | ICD-10-CM | POA: Diagnosis not present

## 2019-06-16 DIAGNOSIS — E1140 Type 2 diabetes mellitus with diabetic neuropathy, unspecified: Secondary | ICD-10-CM | POA: Diagnosis not present

## 2019-06-16 DIAGNOSIS — Z8673 Personal history of transient ischemic attack (TIA), and cerebral infarction without residual deficits: Secondary | ICD-10-CM | POA: Diagnosis not present

## 2019-06-16 DIAGNOSIS — M069 Rheumatoid arthritis, unspecified: Secondary | ICD-10-CM | POA: Diagnosis not present

## 2019-06-17 ENCOUNTER — Telehealth: Payer: Self-pay | Admitting: Family Medicine

## 2019-06-17 NOTE — Telephone Encounter (Signed)
Please advise- You have never filled lefluomide.

## 2019-06-17 NOTE — Telephone Encounter (Signed)
Patient aware.

## 2019-06-17 NOTE — Telephone Encounter (Signed)
lmtcb

## 2019-06-17 NOTE — Telephone Encounter (Signed)
What is the name of the medication? Folic acid ,lefluomide   Have you contacted your pharmacy to request a refill no  Which pharmacy would you like this sent to? [facility]   Patient notified that their request is being sent to the clinical staff for review and that they should receive a call once it is complete. If they do not receive a call within 24 hours they can check with their pharmacy or our office.   Please make sure her athritis med is leflunomide

## 2019-06-17 NOTE — Telephone Encounter (Signed)
Both of these medications come from her rheumatologist. The arava is outside the scope of my practice and requires her specialist to refill.  Ok to approve the folic acid though.

## 2019-06-18 DIAGNOSIS — M069 Rheumatoid arthritis, unspecified: Secondary | ICD-10-CM | POA: Diagnosis not present

## 2019-06-18 DIAGNOSIS — I129 Hypertensive chronic kidney disease with stage 1 through stage 4 chronic kidney disease, or unspecified chronic kidney disease: Secondary | ICD-10-CM | POA: Diagnosis not present

## 2019-06-18 DIAGNOSIS — M1990 Unspecified osteoarthritis, unspecified site: Secondary | ICD-10-CM | POA: Diagnosis not present

## 2019-06-18 DIAGNOSIS — U071 COVID-19: Secondary | ICD-10-CM | POA: Diagnosis not present

## 2019-06-18 DIAGNOSIS — Z794 Long term (current) use of insulin: Secondary | ICD-10-CM | POA: Diagnosis not present

## 2019-06-18 DIAGNOSIS — E1140 Type 2 diabetes mellitus with diabetic neuropathy, unspecified: Secondary | ICD-10-CM | POA: Diagnosis not present

## 2019-06-18 DIAGNOSIS — K273 Acute peptic ulcer, site unspecified, without hemorrhage or perforation: Secondary | ICD-10-CM | POA: Diagnosis not present

## 2019-06-18 DIAGNOSIS — E1122 Type 2 diabetes mellitus with diabetic chronic kidney disease: Secondary | ICD-10-CM | POA: Diagnosis not present

## 2019-06-18 DIAGNOSIS — Z8673 Personal history of transient ischemic attack (TIA), and cerebral infarction without residual deficits: Secondary | ICD-10-CM | POA: Diagnosis not present

## 2019-06-18 DIAGNOSIS — K219 Gastro-esophageal reflux disease without esophagitis: Secondary | ICD-10-CM | POA: Diagnosis not present

## 2019-06-18 DIAGNOSIS — N189 Chronic kidney disease, unspecified: Secondary | ICD-10-CM | POA: Diagnosis not present

## 2019-06-18 DIAGNOSIS — H269 Unspecified cataract: Secondary | ICD-10-CM | POA: Diagnosis not present

## 2019-06-18 DIAGNOSIS — E785 Hyperlipidemia, unspecified: Secondary | ICD-10-CM | POA: Diagnosis not present

## 2019-06-18 DIAGNOSIS — Z9181 History of falling: Secondary | ICD-10-CM | POA: Diagnosis not present

## 2019-06-18 DIAGNOSIS — I2510 Atherosclerotic heart disease of native coronary artery without angina pectoris: Secondary | ICD-10-CM | POA: Diagnosis not present

## 2019-06-18 DIAGNOSIS — R5383 Other fatigue: Secondary | ICD-10-CM | POA: Diagnosis not present

## 2019-06-22 ENCOUNTER — Telehealth: Payer: Self-pay | Admitting: Family Medicine

## 2019-06-22 MED ORDER — FOLIC ACID 1 MG PO TABS: 1.0000 mg | ORAL_TABLET | Freq: Every day | ORAL | 0 refills | Status: DC

## 2019-06-22 NOTE — Telephone Encounter (Signed)
Discontinue the insulin.  Watch BGs.  If morning BGs go above 140, call office and we will start on low dose insulin.

## 2019-06-22 NOTE — Telephone Encounter (Signed)
Pt's daughter states even with giving her mother only 20 units of insulin at bedtime her FBS in the morning is still in the 65's. Should it be adjusted?

## 2019-06-22 NOTE — Telephone Encounter (Signed)
Pt's daughter aware of provider feedback and voiced understanding. °

## 2019-06-23 DIAGNOSIS — Z9181 History of falling: Secondary | ICD-10-CM | POA: Diagnosis not present

## 2019-06-23 DIAGNOSIS — M1990 Unspecified osteoarthritis, unspecified site: Secondary | ICD-10-CM | POA: Diagnosis not present

## 2019-06-23 DIAGNOSIS — E785 Hyperlipidemia, unspecified: Secondary | ICD-10-CM | POA: Diagnosis not present

## 2019-06-23 DIAGNOSIS — Z794 Long term (current) use of insulin: Secondary | ICD-10-CM | POA: Diagnosis not present

## 2019-06-23 DIAGNOSIS — K273 Acute peptic ulcer, site unspecified, without hemorrhage or perforation: Secondary | ICD-10-CM | POA: Diagnosis not present

## 2019-06-23 DIAGNOSIS — R5383 Other fatigue: Secondary | ICD-10-CM | POA: Diagnosis not present

## 2019-06-23 DIAGNOSIS — N189 Chronic kidney disease, unspecified: Secondary | ICD-10-CM | POA: Diagnosis not present

## 2019-06-23 DIAGNOSIS — E1122 Type 2 diabetes mellitus with diabetic chronic kidney disease: Secondary | ICD-10-CM | POA: Diagnosis not present

## 2019-06-23 DIAGNOSIS — H269 Unspecified cataract: Secondary | ICD-10-CM | POA: Diagnosis not present

## 2019-06-23 DIAGNOSIS — I129 Hypertensive chronic kidney disease with stage 1 through stage 4 chronic kidney disease, or unspecified chronic kidney disease: Secondary | ICD-10-CM | POA: Diagnosis not present

## 2019-06-23 DIAGNOSIS — E1140 Type 2 diabetes mellitus with diabetic neuropathy, unspecified: Secondary | ICD-10-CM | POA: Diagnosis not present

## 2019-06-23 DIAGNOSIS — I2510 Atherosclerotic heart disease of native coronary artery without angina pectoris: Secondary | ICD-10-CM | POA: Diagnosis not present

## 2019-06-23 DIAGNOSIS — K219 Gastro-esophageal reflux disease without esophagitis: Secondary | ICD-10-CM | POA: Diagnosis not present

## 2019-06-23 DIAGNOSIS — M069 Rheumatoid arthritis, unspecified: Secondary | ICD-10-CM | POA: Diagnosis not present

## 2019-06-23 DIAGNOSIS — Z8673 Personal history of transient ischemic attack (TIA), and cerebral infarction without residual deficits: Secondary | ICD-10-CM | POA: Diagnosis not present

## 2019-06-24 DIAGNOSIS — E785 Hyperlipidemia, unspecified: Secondary | ICD-10-CM | POA: Diagnosis not present

## 2019-06-24 DIAGNOSIS — M1990 Unspecified osteoarthritis, unspecified site: Secondary | ICD-10-CM | POA: Diagnosis not present

## 2019-06-24 DIAGNOSIS — Z8673 Personal history of transient ischemic attack (TIA), and cerebral infarction without residual deficits: Secondary | ICD-10-CM | POA: Diagnosis not present

## 2019-06-24 DIAGNOSIS — Z794 Long term (current) use of insulin: Secondary | ICD-10-CM | POA: Diagnosis not present

## 2019-06-24 DIAGNOSIS — K273 Acute peptic ulcer, site unspecified, without hemorrhage or perforation: Secondary | ICD-10-CM | POA: Diagnosis not present

## 2019-06-24 DIAGNOSIS — E1140 Type 2 diabetes mellitus with diabetic neuropathy, unspecified: Secondary | ICD-10-CM | POA: Diagnosis not present

## 2019-06-24 DIAGNOSIS — N189 Chronic kidney disease, unspecified: Secondary | ICD-10-CM | POA: Diagnosis not present

## 2019-06-24 DIAGNOSIS — Z9181 History of falling: Secondary | ICD-10-CM | POA: Diagnosis not present

## 2019-06-24 DIAGNOSIS — R5383 Other fatigue: Secondary | ICD-10-CM | POA: Diagnosis not present

## 2019-06-24 DIAGNOSIS — I129 Hypertensive chronic kidney disease with stage 1 through stage 4 chronic kidney disease, or unspecified chronic kidney disease: Secondary | ICD-10-CM | POA: Diagnosis not present

## 2019-06-24 DIAGNOSIS — I2510 Atherosclerotic heart disease of native coronary artery without angina pectoris: Secondary | ICD-10-CM | POA: Diagnosis not present

## 2019-06-24 DIAGNOSIS — K219 Gastro-esophageal reflux disease without esophagitis: Secondary | ICD-10-CM | POA: Diagnosis not present

## 2019-06-24 DIAGNOSIS — H269 Unspecified cataract: Secondary | ICD-10-CM | POA: Diagnosis not present

## 2019-06-24 DIAGNOSIS — E1122 Type 2 diabetes mellitus with diabetic chronic kidney disease: Secondary | ICD-10-CM | POA: Diagnosis not present

## 2019-06-24 DIAGNOSIS — M069 Rheumatoid arthritis, unspecified: Secondary | ICD-10-CM | POA: Diagnosis not present

## 2019-06-25 DIAGNOSIS — E1140 Type 2 diabetes mellitus with diabetic neuropathy, unspecified: Secondary | ICD-10-CM | POA: Diagnosis not present

## 2019-06-25 DIAGNOSIS — K273 Acute peptic ulcer, site unspecified, without hemorrhage or perforation: Secondary | ICD-10-CM | POA: Diagnosis not present

## 2019-06-25 DIAGNOSIS — M1990 Unspecified osteoarthritis, unspecified site: Secondary | ICD-10-CM | POA: Diagnosis not present

## 2019-06-25 DIAGNOSIS — K219 Gastro-esophageal reflux disease without esophagitis: Secondary | ICD-10-CM | POA: Diagnosis not present

## 2019-06-25 DIAGNOSIS — E785 Hyperlipidemia, unspecified: Secondary | ICD-10-CM | POA: Diagnosis not present

## 2019-06-25 DIAGNOSIS — N189 Chronic kidney disease, unspecified: Secondary | ICD-10-CM | POA: Diagnosis not present

## 2019-06-25 DIAGNOSIS — H269 Unspecified cataract: Secondary | ICD-10-CM | POA: Diagnosis not present

## 2019-06-25 DIAGNOSIS — Z8673 Personal history of transient ischemic attack (TIA), and cerebral infarction without residual deficits: Secondary | ICD-10-CM | POA: Diagnosis not present

## 2019-06-25 DIAGNOSIS — I2510 Atherosclerotic heart disease of native coronary artery without angina pectoris: Secondary | ICD-10-CM | POA: Diagnosis not present

## 2019-06-25 DIAGNOSIS — M069 Rheumatoid arthritis, unspecified: Secondary | ICD-10-CM | POA: Diagnosis not present

## 2019-06-25 DIAGNOSIS — Z9181 History of falling: Secondary | ICD-10-CM | POA: Diagnosis not present

## 2019-06-25 DIAGNOSIS — I129 Hypertensive chronic kidney disease with stage 1 through stage 4 chronic kidney disease, or unspecified chronic kidney disease: Secondary | ICD-10-CM | POA: Diagnosis not present

## 2019-06-25 DIAGNOSIS — R5383 Other fatigue: Secondary | ICD-10-CM | POA: Diagnosis not present

## 2019-06-25 DIAGNOSIS — Z794 Long term (current) use of insulin: Secondary | ICD-10-CM | POA: Diagnosis not present

## 2019-06-25 DIAGNOSIS — E1122 Type 2 diabetes mellitus with diabetic chronic kidney disease: Secondary | ICD-10-CM | POA: Diagnosis not present

## 2019-06-29 ENCOUNTER — Ambulatory Visit: Payer: Medicare Other | Admitting: Family Medicine

## 2019-06-29 ENCOUNTER — Other Ambulatory Visit: Payer: Self-pay

## 2019-06-29 ENCOUNTER — Ambulatory Visit (INDEPENDENT_AMBULATORY_CARE_PROVIDER_SITE_OTHER): Payer: Medicare Other

## 2019-06-29 DIAGNOSIS — E1122 Type 2 diabetes mellitus with diabetic chronic kidney disease: Secondary | ICD-10-CM | POA: Diagnosis not present

## 2019-06-29 DIAGNOSIS — M069 Rheumatoid arthritis, unspecified: Secondary | ICD-10-CM

## 2019-06-29 DIAGNOSIS — R5383 Other fatigue: Secondary | ICD-10-CM

## 2019-06-29 DIAGNOSIS — U071 COVID-19: Secondary | ICD-10-CM | POA: Diagnosis not present

## 2019-06-29 DIAGNOSIS — Z8673 Personal history of transient ischemic attack (TIA), and cerebral infarction without residual deficits: Secondary | ICD-10-CM

## 2019-06-29 DIAGNOSIS — K273 Acute peptic ulcer, site unspecified, without hemorrhage or perforation: Secondary | ICD-10-CM

## 2019-06-29 DIAGNOSIS — H269 Unspecified cataract: Secondary | ICD-10-CM

## 2019-06-29 DIAGNOSIS — Z9181 History of falling: Secondary | ICD-10-CM

## 2019-06-29 DIAGNOSIS — I129 Hypertensive chronic kidney disease with stage 1 through stage 4 chronic kidney disease, or unspecified chronic kidney disease: Secondary | ICD-10-CM

## 2019-06-29 DIAGNOSIS — Z8616 Personal history of COVID-19: Secondary | ICD-10-CM

## 2019-06-29 DIAGNOSIS — K219 Gastro-esophageal reflux disease without esophagitis: Secondary | ICD-10-CM

## 2019-06-29 DIAGNOSIS — I2510 Atherosclerotic heart disease of native coronary artery without angina pectoris: Secondary | ICD-10-CM | POA: Diagnosis not present

## 2019-06-29 DIAGNOSIS — M1990 Unspecified osteoarthritis, unspecified site: Secondary | ICD-10-CM

## 2019-06-29 DIAGNOSIS — E785 Hyperlipidemia, unspecified: Secondary | ICD-10-CM

## 2019-06-29 DIAGNOSIS — Z8701 Personal history of pneumonia (recurrent): Secondary | ICD-10-CM

## 2019-06-29 DIAGNOSIS — E1140 Type 2 diabetes mellitus with diabetic neuropathy, unspecified: Secondary | ICD-10-CM

## 2019-06-29 DIAGNOSIS — N189 Chronic kidney disease, unspecified: Secondary | ICD-10-CM

## 2019-06-29 DIAGNOSIS — Z794 Long term (current) use of insulin: Secondary | ICD-10-CM

## 2019-06-30 DIAGNOSIS — E1122 Type 2 diabetes mellitus with diabetic chronic kidney disease: Secondary | ICD-10-CM | POA: Diagnosis not present

## 2019-06-30 DIAGNOSIS — E785 Hyperlipidemia, unspecified: Secondary | ICD-10-CM | POA: Diagnosis not present

## 2019-06-30 DIAGNOSIS — K273 Acute peptic ulcer, site unspecified, without hemorrhage or perforation: Secondary | ICD-10-CM | POA: Diagnosis not present

## 2019-06-30 DIAGNOSIS — M069 Rheumatoid arthritis, unspecified: Secondary | ICD-10-CM | POA: Diagnosis not present

## 2019-06-30 DIAGNOSIS — Z8673 Personal history of transient ischemic attack (TIA), and cerebral infarction without residual deficits: Secondary | ICD-10-CM | POA: Diagnosis not present

## 2019-06-30 DIAGNOSIS — H269 Unspecified cataract: Secondary | ICD-10-CM | POA: Diagnosis not present

## 2019-06-30 DIAGNOSIS — Z794 Long term (current) use of insulin: Secondary | ICD-10-CM | POA: Diagnosis not present

## 2019-06-30 DIAGNOSIS — R5383 Other fatigue: Secondary | ICD-10-CM | POA: Diagnosis not present

## 2019-06-30 DIAGNOSIS — I129 Hypertensive chronic kidney disease with stage 1 through stage 4 chronic kidney disease, or unspecified chronic kidney disease: Secondary | ICD-10-CM | POA: Diagnosis not present

## 2019-06-30 DIAGNOSIS — K219 Gastro-esophageal reflux disease without esophagitis: Secondary | ICD-10-CM | POA: Diagnosis not present

## 2019-06-30 DIAGNOSIS — N189 Chronic kidney disease, unspecified: Secondary | ICD-10-CM | POA: Diagnosis not present

## 2019-06-30 DIAGNOSIS — Z9181 History of falling: Secondary | ICD-10-CM | POA: Diagnosis not present

## 2019-06-30 DIAGNOSIS — M1990 Unspecified osteoarthritis, unspecified site: Secondary | ICD-10-CM | POA: Diagnosis not present

## 2019-06-30 DIAGNOSIS — E1140 Type 2 diabetes mellitus with diabetic neuropathy, unspecified: Secondary | ICD-10-CM | POA: Diagnosis not present

## 2019-06-30 DIAGNOSIS — I2510 Atherosclerotic heart disease of native coronary artery without angina pectoris: Secondary | ICD-10-CM | POA: Diagnosis not present

## 2019-07-01 ENCOUNTER — Other Ambulatory Visit: Payer: Self-pay | Admitting: *Deleted

## 2019-07-01 ENCOUNTER — Telehealth: Payer: Self-pay | Admitting: Family Medicine

## 2019-07-01 DIAGNOSIS — K273 Acute peptic ulcer, site unspecified, without hemorrhage or perforation: Secondary | ICD-10-CM | POA: Diagnosis not present

## 2019-07-01 DIAGNOSIS — M069 Rheumatoid arthritis, unspecified: Secondary | ICD-10-CM | POA: Diagnosis not present

## 2019-07-01 DIAGNOSIS — R5383 Other fatigue: Secondary | ICD-10-CM | POA: Diagnosis not present

## 2019-07-01 DIAGNOSIS — E1122 Type 2 diabetes mellitus with diabetic chronic kidney disease: Secondary | ICD-10-CM | POA: Diagnosis not present

## 2019-07-01 DIAGNOSIS — M1990 Unspecified osteoarthritis, unspecified site: Secondary | ICD-10-CM | POA: Diagnosis not present

## 2019-07-01 DIAGNOSIS — E1140 Type 2 diabetes mellitus with diabetic neuropathy, unspecified: Secondary | ICD-10-CM | POA: Diagnosis not present

## 2019-07-01 DIAGNOSIS — I129 Hypertensive chronic kidney disease with stage 1 through stage 4 chronic kidney disease, or unspecified chronic kidney disease: Secondary | ICD-10-CM | POA: Diagnosis not present

## 2019-07-01 DIAGNOSIS — Z794 Long term (current) use of insulin: Secondary | ICD-10-CM | POA: Diagnosis not present

## 2019-07-01 DIAGNOSIS — K219 Gastro-esophageal reflux disease without esophagitis: Secondary | ICD-10-CM | POA: Diagnosis not present

## 2019-07-01 DIAGNOSIS — Z8673 Personal history of transient ischemic attack (TIA), and cerebral infarction without residual deficits: Secondary | ICD-10-CM | POA: Diagnosis not present

## 2019-07-01 DIAGNOSIS — N189 Chronic kidney disease, unspecified: Secondary | ICD-10-CM | POA: Diagnosis not present

## 2019-07-01 DIAGNOSIS — E785 Hyperlipidemia, unspecified: Secondary | ICD-10-CM | POA: Diagnosis not present

## 2019-07-01 DIAGNOSIS — Z9181 History of falling: Secondary | ICD-10-CM | POA: Diagnosis not present

## 2019-07-01 DIAGNOSIS — H269 Unspecified cataract: Secondary | ICD-10-CM | POA: Diagnosis not present

## 2019-07-01 DIAGNOSIS — I2510 Atherosclerotic heart disease of native coronary artery without angina pectoris: Secondary | ICD-10-CM | POA: Diagnosis not present

## 2019-07-01 MED ORDER — ACCU-CHEK AVIVA PLUS VI STRP: ORAL_STRIP | 3 refills | Status: DC

## 2019-07-01 MED ORDER — ACCU-CHEK SOFTCLIX LANCETS MISC: 3 refills | Status: DC

## 2019-07-01 MED ORDER — ACCU-CHEK AVIVA PLUS W/DEVICE KIT: PACK | 0 refills | Status: DC

## 2019-07-01 NOTE — Telephone Encounter (Signed)
FYI only.

## 2019-07-02 DIAGNOSIS — I129 Hypertensive chronic kidney disease with stage 1 through stage 4 chronic kidney disease, or unspecified chronic kidney disease: Secondary | ICD-10-CM | POA: Diagnosis not present

## 2019-07-02 DIAGNOSIS — E785 Hyperlipidemia, unspecified: Secondary | ICD-10-CM | POA: Diagnosis not present

## 2019-07-02 DIAGNOSIS — K219 Gastro-esophageal reflux disease without esophagitis: Secondary | ICD-10-CM | POA: Diagnosis not present

## 2019-07-02 DIAGNOSIS — Z8673 Personal history of transient ischemic attack (TIA), and cerebral infarction without residual deficits: Secondary | ICD-10-CM | POA: Diagnosis not present

## 2019-07-02 DIAGNOSIS — N189 Chronic kidney disease, unspecified: Secondary | ICD-10-CM | POA: Diagnosis not present

## 2019-07-02 DIAGNOSIS — R5383 Other fatigue: Secondary | ICD-10-CM | POA: Diagnosis not present

## 2019-07-02 DIAGNOSIS — I2510 Atherosclerotic heart disease of native coronary artery without angina pectoris: Secondary | ICD-10-CM | POA: Diagnosis not present

## 2019-07-02 DIAGNOSIS — K273 Acute peptic ulcer, site unspecified, without hemorrhage or perforation: Secondary | ICD-10-CM | POA: Diagnosis not present

## 2019-07-02 DIAGNOSIS — E1140 Type 2 diabetes mellitus with diabetic neuropathy, unspecified: Secondary | ICD-10-CM | POA: Diagnosis not present

## 2019-07-02 DIAGNOSIS — Z9181 History of falling: Secondary | ICD-10-CM | POA: Diagnosis not present

## 2019-07-02 DIAGNOSIS — H269 Unspecified cataract: Secondary | ICD-10-CM | POA: Diagnosis not present

## 2019-07-02 DIAGNOSIS — Z794 Long term (current) use of insulin: Secondary | ICD-10-CM | POA: Diagnosis not present

## 2019-07-02 DIAGNOSIS — M069 Rheumatoid arthritis, unspecified: Secondary | ICD-10-CM | POA: Diagnosis not present

## 2019-07-02 DIAGNOSIS — M1990 Unspecified osteoarthritis, unspecified site: Secondary | ICD-10-CM | POA: Diagnosis not present

## 2019-07-02 DIAGNOSIS — E1122 Type 2 diabetes mellitus with diabetic chronic kidney disease: Secondary | ICD-10-CM | POA: Diagnosis not present

## 2019-07-07 DIAGNOSIS — N189 Chronic kidney disease, unspecified: Secondary | ICD-10-CM | POA: Diagnosis not present

## 2019-07-07 DIAGNOSIS — R5383 Other fatigue: Secondary | ICD-10-CM | POA: Diagnosis not present

## 2019-07-07 DIAGNOSIS — E1122 Type 2 diabetes mellitus with diabetic chronic kidney disease: Secondary | ICD-10-CM | POA: Diagnosis not present

## 2019-07-07 DIAGNOSIS — E1140 Type 2 diabetes mellitus with diabetic neuropathy, unspecified: Secondary | ICD-10-CM | POA: Diagnosis not present

## 2019-07-07 DIAGNOSIS — Z9181 History of falling: Secondary | ICD-10-CM | POA: Diagnosis not present

## 2019-07-07 DIAGNOSIS — K219 Gastro-esophageal reflux disease without esophagitis: Secondary | ICD-10-CM | POA: Diagnosis not present

## 2019-07-07 DIAGNOSIS — H269 Unspecified cataract: Secondary | ICD-10-CM | POA: Diagnosis not present

## 2019-07-07 DIAGNOSIS — Z794 Long term (current) use of insulin: Secondary | ICD-10-CM | POA: Diagnosis not present

## 2019-07-07 DIAGNOSIS — M1990 Unspecified osteoarthritis, unspecified site: Secondary | ICD-10-CM | POA: Diagnosis not present

## 2019-07-07 DIAGNOSIS — I2510 Atherosclerotic heart disease of native coronary artery without angina pectoris: Secondary | ICD-10-CM | POA: Diagnosis not present

## 2019-07-07 DIAGNOSIS — K273 Acute peptic ulcer, site unspecified, without hemorrhage or perforation: Secondary | ICD-10-CM | POA: Diagnosis not present

## 2019-07-07 DIAGNOSIS — I129 Hypertensive chronic kidney disease with stage 1 through stage 4 chronic kidney disease, or unspecified chronic kidney disease: Secondary | ICD-10-CM | POA: Diagnosis not present

## 2019-07-07 DIAGNOSIS — M069 Rheumatoid arthritis, unspecified: Secondary | ICD-10-CM | POA: Diagnosis not present

## 2019-07-07 DIAGNOSIS — E785 Hyperlipidemia, unspecified: Secondary | ICD-10-CM | POA: Diagnosis not present

## 2019-07-07 DIAGNOSIS — Z8673 Personal history of transient ischemic attack (TIA), and cerebral infarction without residual deficits: Secondary | ICD-10-CM | POA: Diagnosis not present

## 2019-07-10 DIAGNOSIS — M1990 Unspecified osteoarthritis, unspecified site: Secondary | ICD-10-CM | POA: Diagnosis not present

## 2019-07-10 DIAGNOSIS — I2510 Atherosclerotic heart disease of native coronary artery without angina pectoris: Secondary | ICD-10-CM | POA: Diagnosis not present

## 2019-07-10 DIAGNOSIS — E1140 Type 2 diabetes mellitus with diabetic neuropathy, unspecified: Secondary | ICD-10-CM | POA: Diagnosis not present

## 2019-07-10 DIAGNOSIS — Z9181 History of falling: Secondary | ICD-10-CM | POA: Diagnosis not present

## 2019-07-10 DIAGNOSIS — K273 Acute peptic ulcer, site unspecified, without hemorrhage or perforation: Secondary | ICD-10-CM | POA: Diagnosis not present

## 2019-07-10 DIAGNOSIS — H269 Unspecified cataract: Secondary | ICD-10-CM | POA: Diagnosis not present

## 2019-07-10 DIAGNOSIS — I129 Hypertensive chronic kidney disease with stage 1 through stage 4 chronic kidney disease, or unspecified chronic kidney disease: Secondary | ICD-10-CM | POA: Diagnosis not present

## 2019-07-10 DIAGNOSIS — N189 Chronic kidney disease, unspecified: Secondary | ICD-10-CM | POA: Diagnosis not present

## 2019-07-10 DIAGNOSIS — K219 Gastro-esophageal reflux disease without esophagitis: Secondary | ICD-10-CM | POA: Diagnosis not present

## 2019-07-10 DIAGNOSIS — Z794 Long term (current) use of insulin: Secondary | ICD-10-CM | POA: Diagnosis not present

## 2019-07-10 DIAGNOSIS — R5383 Other fatigue: Secondary | ICD-10-CM | POA: Diagnosis not present

## 2019-07-10 DIAGNOSIS — E1122 Type 2 diabetes mellitus with diabetic chronic kidney disease: Secondary | ICD-10-CM | POA: Diagnosis not present

## 2019-07-10 DIAGNOSIS — E785 Hyperlipidemia, unspecified: Secondary | ICD-10-CM | POA: Diagnosis not present

## 2019-07-10 DIAGNOSIS — Z8673 Personal history of transient ischemic attack (TIA), and cerebral infarction without residual deficits: Secondary | ICD-10-CM | POA: Diagnosis not present

## 2019-07-10 DIAGNOSIS — M069 Rheumatoid arthritis, unspecified: Secondary | ICD-10-CM | POA: Diagnosis not present

## 2019-07-13 DIAGNOSIS — E785 Hyperlipidemia, unspecified: Secondary | ICD-10-CM | POA: Diagnosis not present

## 2019-07-13 DIAGNOSIS — K273 Acute peptic ulcer, site unspecified, without hemorrhage or perforation: Secondary | ICD-10-CM | POA: Diagnosis not present

## 2019-07-13 DIAGNOSIS — M069 Rheumatoid arthritis, unspecified: Secondary | ICD-10-CM | POA: Diagnosis not present

## 2019-07-13 DIAGNOSIS — Z8673 Personal history of transient ischemic attack (TIA), and cerebral infarction without residual deficits: Secondary | ICD-10-CM | POA: Diagnosis not present

## 2019-07-13 DIAGNOSIS — I129 Hypertensive chronic kidney disease with stage 1 through stage 4 chronic kidney disease, or unspecified chronic kidney disease: Secondary | ICD-10-CM | POA: Diagnosis not present

## 2019-07-13 DIAGNOSIS — R5383 Other fatigue: Secondary | ICD-10-CM | POA: Diagnosis not present

## 2019-07-13 DIAGNOSIS — N189 Chronic kidney disease, unspecified: Secondary | ICD-10-CM | POA: Diagnosis not present

## 2019-07-13 DIAGNOSIS — E1122 Type 2 diabetes mellitus with diabetic chronic kidney disease: Secondary | ICD-10-CM | POA: Diagnosis not present

## 2019-07-13 DIAGNOSIS — E1140 Type 2 diabetes mellitus with diabetic neuropathy, unspecified: Secondary | ICD-10-CM | POA: Diagnosis not present

## 2019-07-13 DIAGNOSIS — H269 Unspecified cataract: Secondary | ICD-10-CM | POA: Diagnosis not present

## 2019-07-13 DIAGNOSIS — Z794 Long term (current) use of insulin: Secondary | ICD-10-CM | POA: Diagnosis not present

## 2019-07-13 DIAGNOSIS — K219 Gastro-esophageal reflux disease without esophagitis: Secondary | ICD-10-CM | POA: Diagnosis not present

## 2019-07-13 DIAGNOSIS — I2510 Atherosclerotic heart disease of native coronary artery without angina pectoris: Secondary | ICD-10-CM | POA: Diagnosis not present

## 2019-07-13 DIAGNOSIS — M1990 Unspecified osteoarthritis, unspecified site: Secondary | ICD-10-CM | POA: Diagnosis not present

## 2019-07-13 DIAGNOSIS — Z9181 History of falling: Secondary | ICD-10-CM | POA: Diagnosis not present

## 2019-07-16 DIAGNOSIS — M1990 Unspecified osteoarthritis, unspecified site: Secondary | ICD-10-CM | POA: Diagnosis not present

## 2019-07-16 DIAGNOSIS — I2510 Atherosclerotic heart disease of native coronary artery without angina pectoris: Secondary | ICD-10-CM | POA: Diagnosis not present

## 2019-07-16 DIAGNOSIS — N189 Chronic kidney disease, unspecified: Secondary | ICD-10-CM | POA: Diagnosis not present

## 2019-07-16 DIAGNOSIS — Z9181 History of falling: Secondary | ICD-10-CM | POA: Diagnosis not present

## 2019-07-16 DIAGNOSIS — E1122 Type 2 diabetes mellitus with diabetic chronic kidney disease: Secondary | ICD-10-CM | POA: Diagnosis not present

## 2019-07-16 DIAGNOSIS — Z794 Long term (current) use of insulin: Secondary | ICD-10-CM | POA: Diagnosis not present

## 2019-07-16 DIAGNOSIS — H269 Unspecified cataract: Secondary | ICD-10-CM | POA: Diagnosis not present

## 2019-07-16 DIAGNOSIS — K219 Gastro-esophageal reflux disease without esophagitis: Secondary | ICD-10-CM | POA: Diagnosis not present

## 2019-07-16 DIAGNOSIS — E785 Hyperlipidemia, unspecified: Secondary | ICD-10-CM | POA: Diagnosis not present

## 2019-07-16 DIAGNOSIS — M069 Rheumatoid arthritis, unspecified: Secondary | ICD-10-CM | POA: Diagnosis not present

## 2019-07-16 DIAGNOSIS — I129 Hypertensive chronic kidney disease with stage 1 through stage 4 chronic kidney disease, or unspecified chronic kidney disease: Secondary | ICD-10-CM | POA: Diagnosis not present

## 2019-07-16 DIAGNOSIS — R5383 Other fatigue: Secondary | ICD-10-CM | POA: Diagnosis not present

## 2019-07-16 DIAGNOSIS — E1140 Type 2 diabetes mellitus with diabetic neuropathy, unspecified: Secondary | ICD-10-CM | POA: Diagnosis not present

## 2019-07-16 DIAGNOSIS — Z8673 Personal history of transient ischemic attack (TIA), and cerebral infarction without residual deficits: Secondary | ICD-10-CM | POA: Diagnosis not present

## 2019-07-16 DIAGNOSIS — K273 Acute peptic ulcer, site unspecified, without hemorrhage or perforation: Secondary | ICD-10-CM | POA: Diagnosis not present

## 2019-07-21 ENCOUNTER — Other Ambulatory Visit: Payer: Self-pay | Admitting: *Deleted

## 2019-07-21 MED ORDER — ATORVASTATIN CALCIUM 10 MG PO TABS: 10.0000 mg | ORAL_TABLET | Freq: Every day | ORAL | 0 refills | Status: DC

## 2019-07-23 DIAGNOSIS — I129 Hypertensive chronic kidney disease with stage 1 through stage 4 chronic kidney disease, or unspecified chronic kidney disease: Secondary | ICD-10-CM | POA: Diagnosis not present

## 2019-07-23 DIAGNOSIS — E1122 Type 2 diabetes mellitus with diabetic chronic kidney disease: Secondary | ICD-10-CM | POA: Diagnosis not present

## 2019-07-23 DIAGNOSIS — I2510 Atherosclerotic heart disease of native coronary artery without angina pectoris: Secondary | ICD-10-CM | POA: Diagnosis not present

## 2019-07-23 DIAGNOSIS — Z9181 History of falling: Secondary | ICD-10-CM | POA: Diagnosis not present

## 2019-07-23 DIAGNOSIS — M1990 Unspecified osteoarthritis, unspecified site: Secondary | ICD-10-CM | POA: Diagnosis not present

## 2019-07-23 DIAGNOSIS — E1140 Type 2 diabetes mellitus with diabetic neuropathy, unspecified: Secondary | ICD-10-CM | POA: Diagnosis not present

## 2019-07-23 DIAGNOSIS — Z8673 Personal history of transient ischemic attack (TIA), and cerebral infarction without residual deficits: Secondary | ICD-10-CM | POA: Diagnosis not present

## 2019-07-23 DIAGNOSIS — E785 Hyperlipidemia, unspecified: Secondary | ICD-10-CM | POA: Diagnosis not present

## 2019-07-23 DIAGNOSIS — K219 Gastro-esophageal reflux disease without esophagitis: Secondary | ICD-10-CM | POA: Diagnosis not present

## 2019-07-23 DIAGNOSIS — H269 Unspecified cataract: Secondary | ICD-10-CM | POA: Diagnosis not present

## 2019-07-23 DIAGNOSIS — K273 Acute peptic ulcer, site unspecified, without hemorrhage or perforation: Secondary | ICD-10-CM | POA: Diagnosis not present

## 2019-07-23 DIAGNOSIS — M069 Rheumatoid arthritis, unspecified: Secondary | ICD-10-CM | POA: Diagnosis not present

## 2019-07-23 DIAGNOSIS — R5383 Other fatigue: Secondary | ICD-10-CM | POA: Diagnosis not present

## 2019-07-23 DIAGNOSIS — N189 Chronic kidney disease, unspecified: Secondary | ICD-10-CM | POA: Diagnosis not present

## 2019-07-23 DIAGNOSIS — Z794 Long term (current) use of insulin: Secondary | ICD-10-CM | POA: Diagnosis not present

## 2019-07-27 DIAGNOSIS — Z794 Long term (current) use of insulin: Secondary | ICD-10-CM | POA: Diagnosis not present

## 2019-07-27 DIAGNOSIS — K273 Acute peptic ulcer, site unspecified, without hemorrhage or perforation: Secondary | ICD-10-CM | POA: Diagnosis not present

## 2019-07-27 DIAGNOSIS — E1140 Type 2 diabetes mellitus with diabetic neuropathy, unspecified: Secondary | ICD-10-CM | POA: Diagnosis not present

## 2019-07-27 DIAGNOSIS — E1122 Type 2 diabetes mellitus with diabetic chronic kidney disease: Secondary | ICD-10-CM | POA: Diagnosis not present

## 2019-07-27 DIAGNOSIS — N189 Chronic kidney disease, unspecified: Secondary | ICD-10-CM | POA: Diagnosis not present

## 2019-07-27 DIAGNOSIS — R5383 Other fatigue: Secondary | ICD-10-CM | POA: Diagnosis not present

## 2019-07-27 DIAGNOSIS — M1990 Unspecified osteoarthritis, unspecified site: Secondary | ICD-10-CM | POA: Diagnosis not present

## 2019-07-27 DIAGNOSIS — Z9181 History of falling: Secondary | ICD-10-CM | POA: Diagnosis not present

## 2019-07-27 DIAGNOSIS — H269 Unspecified cataract: Secondary | ICD-10-CM | POA: Diagnosis not present

## 2019-07-27 DIAGNOSIS — E785 Hyperlipidemia, unspecified: Secondary | ICD-10-CM | POA: Diagnosis not present

## 2019-07-27 DIAGNOSIS — Z8673 Personal history of transient ischemic attack (TIA), and cerebral infarction without residual deficits: Secondary | ICD-10-CM | POA: Diagnosis not present

## 2019-07-27 DIAGNOSIS — M069 Rheumatoid arthritis, unspecified: Secondary | ICD-10-CM | POA: Diagnosis not present

## 2019-07-27 DIAGNOSIS — K219 Gastro-esophageal reflux disease without esophagitis: Secondary | ICD-10-CM | POA: Diagnosis not present

## 2019-07-27 DIAGNOSIS — I129 Hypertensive chronic kidney disease with stage 1 through stage 4 chronic kidney disease, or unspecified chronic kidney disease: Secondary | ICD-10-CM | POA: Diagnosis not present

## 2019-07-27 DIAGNOSIS — I2510 Atherosclerotic heart disease of native coronary artery without angina pectoris: Secondary | ICD-10-CM | POA: Diagnosis not present

## 2019-08-12 ENCOUNTER — Other Ambulatory Visit: Payer: Self-pay | Admitting: *Deleted

## 2019-08-12 DIAGNOSIS — E559 Vitamin D deficiency, unspecified: Secondary | ICD-10-CM

## 2019-08-14 ENCOUNTER — Telehealth: Payer: Self-pay | Admitting: Family Medicine

## 2019-08-14 DIAGNOSIS — E559 Vitamin D deficiency, unspecified: Secondary | ICD-10-CM

## 2019-08-14 NOTE — Telephone Encounter (Signed)
She has completed 8 week course. She will start OTC vitamin D daily now.

## 2019-08-14 NOTE — Telephone Encounter (Signed)
Aware. Begin OTC vitamin d.

## 2019-08-14 NOTE — Telephone Encounter (Signed)
Is this okay to send to [facility] originally written for 8 doses

## 2019-08-17 ENCOUNTER — Other Ambulatory Visit: Payer: Self-pay | Admitting: Family Medicine

## 2019-08-19 ENCOUNTER — Other Ambulatory Visit: Payer: Self-pay | Admitting: Family Medicine

## 2019-09-01 DIAGNOSIS — Z79899 Other long term (current) drug therapy: Secondary | ICD-10-CM | POA: Diagnosis not present

## 2019-09-01 DIAGNOSIS — M0510 Rheumatoid lung disease with rheumatoid arthritis of unspecified site: Secondary | ICD-10-CM | POA: Diagnosis not present

## 2019-09-01 DIAGNOSIS — E119 Type 2 diabetes mellitus without complications: Secondary | ICD-10-CM | POA: Diagnosis not present

## 2019-09-01 DIAGNOSIS — M069 Rheumatoid arthritis, unspecified: Secondary | ICD-10-CM | POA: Diagnosis not present

## 2019-09-01 DIAGNOSIS — M810 Age-related osteoporosis without current pathological fracture: Secondary | ICD-10-CM | POA: Diagnosis not present

## 2019-09-12 ENCOUNTER — Other Ambulatory Visit: Payer: Self-pay | Admitting: Family Medicine

## 2019-09-28 DIAGNOSIS — M069 Rheumatoid arthritis, unspecified: Secondary | ICD-10-CM | POA: Diagnosis not present

## 2019-09-28 DIAGNOSIS — N179 Acute kidney failure, unspecified: Secondary | ICD-10-CM | POA: Diagnosis not present

## 2019-09-28 DIAGNOSIS — E1129 Type 2 diabetes mellitus with other diabetic kidney complication: Secondary | ICD-10-CM | POA: Diagnosis not present

## 2019-09-28 DIAGNOSIS — N184 Chronic kidney disease, stage 4 (severe): Secondary | ICD-10-CM | POA: Diagnosis not present

## 2019-09-28 DIAGNOSIS — N183 Chronic kidney disease, stage 3 unspecified: Secondary | ICD-10-CM | POA: Diagnosis not present

## 2019-09-28 DIAGNOSIS — I129 Hypertensive chronic kidney disease with stage 1 through stage 4 chronic kidney disease, or unspecified chronic kidney disease: Secondary | ICD-10-CM | POA: Diagnosis not present

## 2019-10-01 ENCOUNTER — Telehealth: Payer: Self-pay | Admitting: Family Medicine

## 2019-10-01 NOTE — Chronic Care Management (AMB) (Signed)
  Chronic Care Management   Note  10/01/2019 Name: Erica Crosby MRN: [id number] DOB: 07-16-1945  Erica Crosby is a 74 y.o. year old female who is a primary care patient of [name], DO. I reached out to [name] by phone today in response to a referral sent by Erica Crosby's health plan.     [name] was given information about Chronic Care Management services today including:  1. CCM service includes personalized support from designated clinical staff supervised by her physician, including individualized plan of care and coordination with other care providers 2. 24/7 contact phone numbers for assistance for urgent and routine care needs. 3. Service will only be billed when office clinical staff spend 20 minutes or more in a month to coordinate care. 4. Only one practitioner may furnish and bill the service in a calendar month. 5. The patient may stop CCM services at any time (effective at the end of the month) by phone call to the office staff. 6. The patient will be responsible for cost sharing (co-pay) of up to 20% of the service fee (after annual deductible is met).  Patient agreed to services and verbal consent obtained.   Follow up plan: Telephone appointment with care management team member scheduled for:10/21/2019  Noreene Larsson, [address], [place], [place] [address] Direct Dial: [phone] [email or web address] Website: [email or web address]

## 2019-10-09 ENCOUNTER — Encounter (HOSPITAL_COMMUNITY): Payer: Self-pay

## 2019-10-09 ENCOUNTER — Other Ambulatory Visit: Payer: Self-pay

## 2019-10-09 ENCOUNTER — Encounter (HOSPITAL_COMMUNITY)
Admission: RE | Admit: 2019-10-09 | Discharge: 2019-10-09 | Disposition: A | Discharge status: Home / Self Care | Payer: Medicare Other | Source: Ambulatory Visit | Attending: Nephrology | Admitting: Nephrology

## 2019-10-09 DIAGNOSIS — D631 Anemia in chronic kidney disease: Secondary | ICD-10-CM | POA: Insufficient documentation

## 2019-10-09 DIAGNOSIS — N189 Chronic kidney disease, unspecified: Secondary | ICD-10-CM | POA: Diagnosis not present

## 2019-10-09 HISTORY — DX: Anemia, unspecified: D64.9

## 2019-10-09 MED ORDER — SODIUM CHLORIDE 0.9 % IV SOLN
510.0000 mg | Freq: Once | INTRAVENOUS | Status: AC
  Administered 2019-10-09: 15:00:00 510 mg via INTRAVENOUS
  Filled 2019-10-09: qty 17

## 2019-10-09 MED ORDER — SODIUM CHLORIDE 0.9 % IV SOLN: Freq: Once | INTRAVENOUS | Status: AC

## 2019-10-16 ENCOUNTER — Other Ambulatory Visit: Payer: Self-pay

## 2019-10-16 ENCOUNTER — Encounter (HOSPITAL_COMMUNITY)
Admission: RE | Admit: 2019-10-16 | Discharge: 2019-10-16 | Disposition: A | Discharge status: Home / Self Care | Payer: Medicare Other | Source: Ambulatory Visit | Attending: Nephrology | Admitting: Nephrology

## 2019-10-16 DIAGNOSIS — N189 Chronic kidney disease, unspecified: Secondary | ICD-10-CM | POA: Diagnosis not present

## 2019-10-16 DIAGNOSIS — D631 Anemia in chronic kidney disease: Secondary | ICD-10-CM | POA: Diagnosis not present

## 2019-10-16 MED ORDER — SODIUM CHLORIDE 0.9 % IV SOLN: Freq: Once | INTRAVENOUS | Status: AC

## 2019-10-16 MED ORDER — SODIUM CHLORIDE 0.9 % IV SOLN
510.0000 mg | Freq: Once | INTRAVENOUS | Status: AC
  Administered 2019-10-16: 510 mg via INTRAVENOUS
  Filled 2019-10-16: qty 510

## 2019-10-19 ENCOUNTER — Other Ambulatory Visit: Payer: Self-pay | Admitting: Family Medicine

## 2019-10-21 ENCOUNTER — Ambulatory Visit (INDEPENDENT_AMBULATORY_CARE_PROVIDER_SITE_OTHER): Payer: Medicare Other | Admitting: Licensed Clinical Social Worker

## 2019-10-21 DIAGNOSIS — I129 Hypertensive chronic kidney disease with stage 1 through stage 4 chronic kidney disease, or unspecified chronic kidney disease: Secondary | ICD-10-CM

## 2019-10-21 DIAGNOSIS — E1169 Type 2 diabetes mellitus with other specified complication: Secondary | ICD-10-CM

## 2019-10-21 DIAGNOSIS — Z794 Long term (current) use of insulin: Secondary | ICD-10-CM

## 2019-10-21 DIAGNOSIS — N183 Chronic kidney disease, stage 3 unspecified: Secondary | ICD-10-CM

## 2019-10-21 DIAGNOSIS — R270 Ataxia, unspecified: Secondary | ICD-10-CM

## 2019-10-21 DIAGNOSIS — IMO0002 Reserved for concepts with insufficient information to code with codable children: Secondary | ICD-10-CM

## 2019-10-21 DIAGNOSIS — E1121 Type 2 diabetes mellitus with diabetic nephropathy: Secondary | ICD-10-CM

## 2019-10-21 DIAGNOSIS — E785 Hyperlipidemia, unspecified: Secondary | ICD-10-CM | POA: Diagnosis not present

## 2019-10-21 DIAGNOSIS — E1122 Type 2 diabetes mellitus with diabetic chronic kidney disease: Secondary | ICD-10-CM

## 2019-10-21 DIAGNOSIS — E1165 Type 2 diabetes mellitus with hyperglycemia: Secondary | ICD-10-CM | POA: Diagnosis not present

## 2019-10-21 NOTE — Patient Instructions (Addendum)
Licensed Clinical Social Worker Visit Information  Goals we discussed today:  Goals Addressed            This Visit's Progress   . Client will talk with LCSW in next 30 days to discuss ADLs completion of client (pt-stated)       CARE PLAN ENTRY  Current Barriers:   Transport challenges Ambulation challenges (uses a cane to walk) in patient with Chronic Diagnoses of Ataxia, Type 2 DM, HTN, Mixed Hyperlipidemia   Clinical Social Work Clinical Goal(s):  Marland Kitchen LCSW will call client in next 30 days to talk with client /daughter about ADLs completion of client  Interventions:  Talked with Erica Crosby, daughter, about CCM program Talked with Erica Crosby about RNCM support with CCM program Talked with Erica Crosby about client appetite Talked with Erica Crosby about client completion of ADLs Talked with Erica Crosby about client upcoming appointments Talked with Erica Crosby about hearing issues of client Talked with Erica Crosby about pain issues of client Talked with Erica Crosby about DME equipment use of client (has shower chair) Talked with Erica Crosby about relaxation techniques of client (client likes to watch TV, does word games, crossword puzzles, enjoys shopping when she is able, walks outside with assistance) Talked with Erica Crosby about transport assist services for client (RCATS and Wellsite geologist) Talked with Erica Crosby about client decreased energy  Patient Self Care Activities:   Eats meals with set up assistance Does some ADLs indedpendently  Patient Self Care Deficits . Transport challenges . Mobility challenges  Initial goal documentation       Materials Provided: No  Follow Up Plan: LCSW to call client or daughter of client in next 4 weeks to talk about client completion of daily ADLs  The patient/Erica Crosby, daughter, verbalized understanding of instructions provided today and declined a print copy of patient instruction materials.   Erica Crosby.Erica Crosby MSW, LCSW Licensed Clinical Social Worker [facility] [phone]

## 2019-10-21 NOTE — Chronic Care Management (AMB) (Signed)
Chronic Care Management    Clinical Social Work Follow Up Note  10/21/2019 Name: Erica Crosby MRN: [id number] DOB: 02-06-1946  Erica Crosby is a 74 y.o. year old female who is a primary care patient of [name], DO. The CCM team was consulted for assistance with [name] .   Review of patient status, including review of consultants reports, other relevant assessments, and collaboration with appropriate care team members and the patient's provider was performed as part of comprehensive patient evaluation and provision of chronic care management services.    SDOH (Social Determinants of Health) assessments performed: Yes;risk for depression;risk for tobacco use; risk for physical inactivity    Chronic Care Management from 10/21/2019 in [facility]  PHQ-9 Total Score  7     GAD 7 : Generalized Anxiety Score 10/21/2019  Nervous, Anxious, on Edge 0  Control/stop worrying 0  Worry too much - different things 0  Trouble relaxing 0  Restless 0  Easily annoyed or irritable 0  Afraid - awful might happen 0  Total GAD 7 Score 0  Anxiety Difficulty Somewhat difficult   SDOH Interventions     Most Recent Value  SDOH Interventions  Depression Interventions/Treatment   -- [LCSW talked with daugher of client about LCSW support]      Outpatient Encounter Medications as of 10/21/2019  Medication Sig  . Accu-Chek Softclix Lancets lancets Test BS BID and prn Dx E11.21  . Adalimumab (HUMIRA PEN) 40 MG/0.4ML PNKT Inject 40 mg into the skin every 14 (fourteen) days. Can restart 06/04/19  . alendronate (FOSAMAX) 70 MG tablet Take 1 tablet (70 mg total) by mouth once a week. Take with a full glass of water on an empty stomach (Needs to be seen before next refill)  . aspirin EC 81 MG tablet Take 81 mg by mouth daily.  Marland Kitchen atorvastatin (LIPITOR) 10 MG tablet Take 1 tablet (10 mg total) by mouth daily. (Needs to be seen before next refill)  . Blood Glucose Monitoring  Suppl (ACCU-CHEK AVIVA PLUS) w/Device KIT Test BS BID and prn Dx E11.21  . carvedilol (COREG) 12.5 MG tablet Take 1 tablet (12.5 mg total) by mouth 2 (two) times daily with a meal.  . enalapril (VASOTEC) 20 MG tablet Take 2 tablets (40 mg total) by mouth daily.  . folic acid (FOLVITE) 1 MG tablet Take 1 tablet (1 mg total) by mouth daily.  Marland Kitchen gabapentin (NEURONTIN) 400 MG capsule Take 1 capsule (400 mg total) by mouth at bedtime. Needs an appointment for future refills.  Marland Kitchen glucose blood (ACCU-CHEK AVIVA PLUS) test strip Test BS BID and prn Dx E11.21  . Insulin Glargine (LANTUS SOLOSTAR) 100 UNIT/ML Solostar Pen Inject 20-30 Units into the skin at bedtime.  Marland Kitchen leflunomide (ARAVA) 20 MG tablet Take 1 tablet (20 mg total) by mouth daily. Can restart 06/04/19  . OPTIMAL-D 1.25 MG (50000 UT) capsule TAKE 1 CAPSULE BY MOUTH ONCE A WEEK FOR 8 DOSES  . pantoprazole (PROTONIX) 40 MG tablet TAKE 1 TABLET DAILY   No facility-administered encounter medications on file as of 10/21/2019.    Goals Addressed            This Visit's Progress   . Client will talk with LCSW in next 30 days to discuss ADLs completion of client (pt-stated)       CARE PLAN ENTRY  Current Barriers:   Transport challenges Ambulation challenges (uses a cane to walk) in patient with Chronic Diagnoses  of Ataxia, Type 2 DM, HTN, Mixed Hyperlipidemia   Clinical Social Work Clinical Goal(s):  Marland Kitchen LCSW will call client in next 30 days to talk with client /daughter about ADLs completion of client  Interventions:  Talked with Luan Moore, daughter, about CCM program Talked with [name] about RNCM support with CCM program Talked with [name] about client appetite Talked with [name] about client completion of ADLs Talked with [name] about client upcoming appointments Talked with [name] about hearing issues of client Talked with [name] about pain issues of client Talked with [name] about DME equipment use of client (has shower chair) Talked  with [name] about relaxation techniques of client (client likes to watch TV, does word games, crossword puzzles, enjoys shopping when she is able, walks outside with assistance) Talked with [name] about transport assist services for client (RCATS and Wellsite geologist) Talked with [name] about client decreased energy  Patient Self Care Activities:   Eats meals with set up assistance Does some ADLs indedpendently  Patient Self Care Deficits . Transport challenges . Mobility challenges  Initial goal documentation       Follow Up Plan: LCSW to call client or daughter of client in next 4 weeks to talk about client completion of daily ADLs  [name].Farid Grigorian MSW, LCSW Licensed Clinical Social Worker [facility] [phone]

## 2019-11-09 ENCOUNTER — Telehealth: Payer: Self-pay

## 2019-11-18 ENCOUNTER — Other Ambulatory Visit: Payer: Self-pay | Admitting: Family Medicine

## 2019-12-02 DIAGNOSIS — M0510 Rheumatoid lung disease with rheumatoid arthritis of unspecified site: Secondary | ICD-10-CM | POA: Diagnosis not present

## 2019-12-02 DIAGNOSIS — M069 Rheumatoid arthritis, unspecified: Secondary | ICD-10-CM | POA: Diagnosis not present

## 2019-12-02 DIAGNOSIS — E119 Type 2 diabetes mellitus without complications: Secondary | ICD-10-CM | POA: Diagnosis not present

## 2019-12-02 DIAGNOSIS — M810 Age-related osteoporosis without current pathological fracture: Secondary | ICD-10-CM | POA: Diagnosis not present

## 2019-12-02 DIAGNOSIS — Z79899 Other long term (current) drug therapy: Secondary | ICD-10-CM | POA: Diagnosis not present

## 2019-12-08 ENCOUNTER — Other Ambulatory Visit: Payer: Self-pay | Admitting: Family Medicine

## 2019-12-09 ENCOUNTER — Other Ambulatory Visit: Payer: Self-pay

## 2019-12-09 ENCOUNTER — Ambulatory Visit (INDEPENDENT_AMBULATORY_CARE_PROVIDER_SITE_OTHER): Payer: Medicare Other | Admitting: Family Medicine

## 2019-12-09 ENCOUNTER — Encounter: Payer: Self-pay | Admitting: Family Medicine

## 2019-12-09 VITALS — BP 148/67 | HR 70 | Temp 97.4°F | Ht 67.0 in | Wt 122.8 lb

## 2019-12-09 DIAGNOSIS — Z794 Long term (current) use of insulin: Secondary | ICD-10-CM

## 2019-12-09 DIAGNOSIS — I129 Hypertensive chronic kidney disease with stage 1 through stage 4 chronic kidney disease, or unspecified chronic kidney disease: Secondary | ICD-10-CM | POA: Diagnosis not present

## 2019-12-09 DIAGNOSIS — E785 Hyperlipidemia, unspecified: Secondary | ICD-10-CM

## 2019-12-09 DIAGNOSIS — E559 Vitamin D deficiency, unspecified: Secondary | ICD-10-CM

## 2019-12-09 DIAGNOSIS — Z7409 Other reduced mobility: Secondary | ICD-10-CM

## 2019-12-09 DIAGNOSIS — E1122 Type 2 diabetes mellitus with diabetic chronic kidney disease: Secondary | ICD-10-CM

## 2019-12-09 DIAGNOSIS — E1121 Type 2 diabetes mellitus with diabetic nephropathy: Secondary | ICD-10-CM | POA: Diagnosis not present

## 2019-12-09 DIAGNOSIS — E1169 Type 2 diabetes mellitus with other specified complication: Secondary | ICD-10-CM | POA: Diagnosis not present

## 2019-12-09 DIAGNOSIS — B351 Tinea unguium: Secondary | ICD-10-CM | POA: Diagnosis not present

## 2019-12-09 DIAGNOSIS — Z748 Other problems related to care provider dependency: Secondary | ICD-10-CM | POA: Diagnosis not present

## 2019-12-09 DIAGNOSIS — Z789 Other specified health status: Secondary | ICD-10-CM

## 2019-12-09 DIAGNOSIS — E1165 Type 2 diabetes mellitus with hyperglycemia: Secondary | ICD-10-CM | POA: Diagnosis not present

## 2019-12-09 DIAGNOSIS — N183 Chronic kidney disease, stage 3 unspecified: Secondary | ICD-10-CM

## 2019-12-09 LAB — BAYER DCA HB A1C WAIVED: HB A1C (BAYER DCA - WAIVED): 6.1 % (ref <7.0)

## 2019-12-09 MED ORDER — ATORVASTATIN CALCIUM 10 MG PO TABS: 10.0000 mg | ORAL_TABLET | Freq: Every day | ORAL | 3 refills | Status: DC

## 2019-12-09 MED ORDER — FOLIC ACID 1 MG PO TABS: 1.0000 mg | ORAL_TABLET | Freq: Every day | ORAL | 3 refills | Status: DC

## 2019-12-09 MED ORDER — PANTOPRAZOLE SODIUM 40 MG PO TBEC: 40.0000 mg | DELAYED_RELEASE_TABLET | Freq: Every day | ORAL | 0 refills | Status: DC

## 2019-12-09 MED ORDER — CARVEDILOL 12.5 MG PO TABS: 12.5000 mg | ORAL_TABLET | Freq: Two times a day (BID) | ORAL | 3 refills | Status: DC

## 2019-12-09 MED ORDER — ENALAPRIL MALEATE 20 MG PO TABS: 40.0000 mg | ORAL_TABLET | Freq: Every day | ORAL | 3 refills | Status: DC

## 2019-12-09 MED ORDER — GABAPENTIN 400 MG PO CAPS: 400.0000 mg | ORAL_CAPSULE | Freq: Every day | ORAL | 1 refills | Status: DC

## 2019-12-09 MED ORDER — LANTUS SOLOSTAR 100 UNIT/ML ~~LOC~~ SOPN: 5.0000 [IU] | PEN_INJECTOR | Freq: Every day | SUBCUTANEOUS | 2 refills | Status: DC

## 2019-12-09 NOTE — Patient Instructions (Signed)
Try going down to just 5 units of insulin each night.  I don't want your sugar to go below 70.  If your sugar goes above 300, let me know.  You may be able to come off the insulin totally.  Your a1c today was 6.1.  This is CONTROLLED blood sugar but I don't want you going much below this level.  I have placed a referral to Dr Irving Shows for your nails.

## 2019-12-09 NOTE — Progress Notes (Signed)
Subjective: CC:DM2/ HTN2 PCP: [name], DO HPI:Erica Crosby is a 74 y.o. female presenting to clinic today for:  1. Type 2 Diabetes w/ CKD, HTN and HLD  Previously cared for by Dr Dorris Fetch.  Discontinued going to appts in 11/2017 for unknown reasons.  In December insulin was dc'd due to hypoglycemia.  She was lost to follow up and returns today for monitoring of her DM.  She takes Lipitor 10 and Enalapril 79m and Coreg 12.5.  She notes she never really stopped the insulin.  She has been injecting about 10 units 3-4 times per week.  Blood sugars have been running 80s to 130s.  She had an outlier of 400 after she received her Covid vaccination but she has not seen any levels that high since.  She continues to have neuropathy in the legs.  No chest pain, shortness of breath.  She is asking for assistance with transportation and personal care services at home.  She notes somebody came by several months ago to check in on her but she has not had anybody return since.  Her daughter works full-time during the day and is not able to help her.  She uses a cane at baseline for ambulation.  [facility] exam: [facility] Last A1c:  Lab Results  Component Value Date   HGBA1C 8.3 (H) 03/11/2019  Nephropathy screen indicated?: on ACE-I Last flu, zoster and/or pneumovax: declines   ROS: Per HPI  No Known Allergies Past Medical History:  Diagnosis Date  . Anemia   . Arthritis   . Cataract   . Coronary artery disease    Mild plaque 2012  . Diabetes mellitus   . Essential hypertension 02/10/2012  . GERD (gastroesophageal reflux disease)   . Hyperlipidemia   . Hypertension   . Neuropathy   . Peptic ulcer   . [place]   . TIA (transient ischemic attack) 02/10/2012    Current Outpatient Medications:  .  Accu-Chek Softclix Lancets lancets, Test BS BID and prn Dx E11.21, Disp: 200 each, Rfl: 3 .  Adalimumab (HUMIRA PEN) 40 MG/0.4ML PNKT,  Inject 40 mg into the skin every 14 (fourteen) days. Can restart 06/04/19, Disp: 2 each, Rfl:  .  alendronate (FOSAMAX) 70 MG tablet, Take 1 tablet (70 mg total) by mouth once a week. Take with a full glass of water on an empty stomach (Needs to be seen before next refill), Disp: 4 tablet, Rfl: 0 .  aspirin EC 81 MG tablet, Take 81 mg by mouth daily., Disp: , Rfl:  .  atorvastatin (LIPITOR) 10 MG tablet, Take 1 tablet (10 mg total) by mouth daily. (Needs to be seen before next refill), Disp: 30 tablet, Rfl: 0 .  Blood Glucose Monitoring Suppl (ACCU-CHEK AVIVA PLUS) w/Device KIT, Test BS BID and prn Dx E11.21, Disp: 1 kit, Rfl: 0 .  carvedilol (COREG) 12.5 MG tablet, Take 1 tablet (12.5 mg total) by mouth 2 (two) times daily with a meal., Disp: , Rfl:  .  enalapril (VASOTEC) 20 MG tablet, Take 2 tablets (40 mg total) by mouth daily., Disp: 180 tablet, Rfl: 3 .  folic acid (FOLVITE) 1 MG tablet, TAKE 1 TABLET ONCE DAILY, Disp: 90 tablet, Rfl: 0 .  gabapentin (NEURONTIN) 400 MG capsule, Take 1 capsule (400 mg total) by mouth at bedtime. Needs to be seen for further refills, Disp: 30 capsule, Rfl: 0 .  glucose blood (ACCU-CHEK AVIVA PLUS)  test strip, Test BS BID and prn Dx E11.21, Disp: 200 each, Rfl: 3 .  Insulin Glargine (LANTUS SOLOSTAR) 100 UNIT/ML Solostar Pen, Inject 20-30 Units into the skin at bedtime., Disp: 15 mL, Rfl: 2 .  leflunomide (ARAVA) 20 MG tablet, Take 1 tablet (20 mg total) by mouth daily. Can restart 06/04/19, Disp:  , Rfl:  .  OPTIMAL-D 1.25 MG (50000 UT) capsule, TAKE 1 CAPSULE BY MOUTH ONCE A WEEK FOR 8 DOSES, Disp: 8 capsule, Rfl: 0 .  pantoprazole (PROTONIX) 40 MG tablet, Take 1 tablet (40 mg total) by mouth daily. Needs to be seen for further refills, Disp: 30 tablet, Rfl: 0 Social History   Socioeconomic History  . Marital status: Widowed    Spouse name: Not on file  . Number of children: 3  . Years of education: 75  . Highest education level: High school graduate   Occupational History  . Not on file  Tobacco Use  . Smoking status: Former Smoker    Years: 25.00    Types: Cigarettes, E-cigarettes    Quit date: 02/02/2013    Years since quitting: 6.8  . Smokeless tobacco: Never Used  Vaping Use  . Vaping Use: Never used  Substance and Sexual Activity  . Alcohol use: No  . Drug use: No  . Sexual activity: Not Currently    Birth control/protection: Post-menopausal  Other Topics Concern  . Not on file  Social History Narrative   Daughter stays with her.    Social Determinants of Health   Financial Resource Strain: Low Risk   . Difficulty of Paying Living Expenses: Not hard at all  Food Insecurity: No Food Insecurity  . Worried About Charity fundraiser in the Last Year: Never true  . Ran Out of Food in the Last Year: Never true  Transportation Needs: No Transportation Needs  . Lack of Transportation (Medical): No  . Lack of Transportation (Non-Medical): No  Physical Activity: Inactive  . Days of Exercise per Week: 0 days  . Minutes of Exercise per Session: 0 min  Stress: No Stress Concern Present  . Feeling of Stress : Only a little  Social Connections: Socially Isolated  . Frequency of Communication with Friends and Family: Three times a week  . Frequency of Social Gatherings with Friends and Family: More than three times a week  . Attends Religious Services: Never  . Active Member of Clubs or Organizations: No  . Attends Archivist Meetings: Never  . Marital Status: Widowed  Intimate Partner Violence: Not At Risk  . Fear of Current or Ex-Partner: No  . Emotionally Abused: No  . Physically Abused: No  . Sexually Abused: No   Family History  Problem Relation Age of Onset  . Heart disease Sister        Congeital (died age 58) with "enlarged heart"  . CAD Sister   . Diabetes Mother   . Heart disease Mother        Later onset heart disease   . Dementia Mother   . Alcohol abuse Father   . Liver disease Father   . CAD  Sister 82       CABG  . Early death Sister   . Diabetes Brother   . Kidney disease Brother        related to DM  . Diabetes Brother     Objective: Office vital signs reviewed. BP (!) 148/67   Pulse 70   Temp (!) 97.4 F (  36.3 C)   Ht '5\' 7"'  (1.702 m)   Wt 122 lb 12.8 oz (55.7 kg)   SpO2 99%   BMI 19.23 kg/m   Physical Examination:  General: Awake, alert, elderly, chronically ill-appearing female.  No acute distress HEENT: Normal, sclera white Cardio: regular rate and rhythm, S1S2 heard, no murmurs appreciated Pulm: clear to auscultation bilaterally, no wheezes, rhonchi or rales; normal work of breathing on room air Extremities: warm, well perfused, No edema, cyanosis or clubbing; +2 pulses bilaterally MSK: uses cane for ambulation; tone is fair Neuro: see DM.  Alert and oriented. Diabetic Foot Exam - Simple   Simple Foot Form Diabetic Foot exam was performed with the following findings: Yes 12/09/2019 10:07 AM  Visual Inspection See comments: Yes Sensation Testing Intact to touch and monofilament testing bilaterally: Yes Pulse Check Posterior Tibialis and Dorsalis pulse intact bilaterally: Yes Comments Onychomycotic changes to the nails bilaterally.  She has 2 areas of callus formation along the plantar surface of the right mid foot.  No ulceration or skin breakdown.     Assessment/ Plan: 74 y.o. female   1. Controlled type 2 diabetes mellitus with other specified complication, with long-term current use of insulin (HCC) A1c now uncontrolled range at 6.1.  I worry about risk of hypoglycemia given events in December.  I advised her to reduce her insulin further to only 5 units nightly.  She will continue monitoring her blood sugar.  I have placed a referral to podiatry for nail care.  She may benefit from some diabetic shoes as well based on today's exam. - Bayer DCA Hb A1c Waived - Ambulatory referral to Podiatry  2. 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  I would prefer tighter control given her renal dysfunction.  She is currently established with renal so I will defer further adjustments to their discretion. - CMP14+EGFR  3. Hyperlipidemia associated with type 2 diabetes mellitus (HCC) Continue atorvastatin  4. Vitamin D deficiency Currently on Fosamax.  Check vitamin D - VITAMIN D 25 Hydroxy (Vit-D Deficiency, Fractures)  5. Onychomycosis - Ambulatory referral to Podiatry  6. Assistance needed with transportation Referral placed to CCM - Referral to Chronic Care Management Services  7. Impaired mobility and ADLs - Referral to Chronic Care Management Services   No orders of the defined types were placed in this encounter.  No orders of the defined types were placed in this encounter.    [name], DO [place] [phone]

## 2019-12-10 LAB — CMP14+EGFR
ALT: 14 IU/L (ref 0–32)
AST: 21 IU/L (ref 0–40)
Albumin/Globulin Ratio: 1.2 (ref 1.2–2.2)
Albumin: 3.6 g/dL — ABNORMAL LOW (ref 3.7–4.7)
Alkaline Phosphatase: 90 IU/L (ref 48–121)
BUN/Creatinine Ratio: 11 — ABNORMAL LOW (ref 12–28)
BUN: 20 mg/dL (ref 8–27)
Bilirubin Total: 0.4 mg/dL (ref 0.0–1.2)
CO2: 27 mmol/L (ref 20–29)
Calcium: 8.9 mg/dL (ref 8.7–10.3)
Chloride: 103 mmol/L (ref 96–106)
Creatinine, Ser: 1.74 mg/dL — ABNORMAL HIGH (ref 0.57–1.00)
GFR calc Af Amer: 33 mL/min/1.73 — ABNORMAL LOW (ref >59)
GFR calc non Af Amer: 29 mL/min/1.73 — ABNORMAL LOW (ref >59)
Globulin, Total: 3.1 g/dL (ref 1.5–4.5)
Glucose: 155 mg/dL — ABNORMAL HIGH (ref 65–99)
Potassium: 4.7 mmol/L (ref 3.5–5.2)
Sodium: 141 mmol/L (ref 134–144)
Total Protein: 6.7 g/dL (ref 6.0–8.5)

## 2019-12-10 LAB — VITAMIN D 25 HYDROXY (VIT D DEFICIENCY, FRACTURES): Vit D, 25-Hydroxy: 50.9 ng/mL (ref 30.0–100.0)

## 2019-12-15 ENCOUNTER — Ambulatory Visit (INDEPENDENT_AMBULATORY_CARE_PROVIDER_SITE_OTHER): Payer: Medicare Other | Admitting: Licensed Clinical Social Worker

## 2019-12-15 DIAGNOSIS — Z794 Long term (current) use of insulin: Secondary | ICD-10-CM

## 2019-12-15 DIAGNOSIS — N183 Chronic kidney disease, stage 3 unspecified: Secondary | ICD-10-CM | POA: Diagnosis not present

## 2019-12-15 DIAGNOSIS — I129 Hypertensive chronic kidney disease with stage 1 through stage 4 chronic kidney disease, or unspecified chronic kidney disease: Secondary | ICD-10-CM | POA: Diagnosis not present

## 2019-12-15 DIAGNOSIS — E785 Hyperlipidemia, unspecified: Secondary | ICD-10-CM

## 2019-12-15 DIAGNOSIS — E1122 Type 2 diabetes mellitus with diabetic chronic kidney disease: Secondary | ICD-10-CM | POA: Diagnosis not present

## 2019-12-15 DIAGNOSIS — E1169 Type 2 diabetes mellitus with other specified complication: Secondary | ICD-10-CM

## 2019-12-15 NOTE — Patient Instructions (Addendum)
Licensed Clinical Social Worker Visit Information  Goals we discussed today:  Goals    .  Client will talk with LCSW in next 30 days to discuss ADLs completion of client (pt-stated)      CARE PLAN ENTRY   Current Barriers:   Transport challenges Ambulation challenges (uses a cane to walk) in patient with Chronic Diagnoses of Ataxia, Type 2 DM, HTN, Mixed Hyperlipidemia   Clinical Social Work Clinical Goal(s):  Erica Kitchen LCSW will call client in next 30 days to talk with client /daughter about ADLs completion of client  Interventions:  Talked with [name], daughter, previously about CCM program Talked with [name] previously about RNCM support with CCM program Talked with client about client appetite Talked with client about client completion of ADLs Talked with client about client upcoming appointments Talked with client about hearing issues of client Talked with client about pain issues of client (she spoke of burning feeling in her feet and fingers and legs) Talked with client about DME equipment use of client (has shower chair) Talked with client about relaxation techniques of client (client likes to watch TV, does word games, crossword puzzles, enjoys shopping when she is able, walks outside with assistance) Talked with client about transport assist services for client (RCATS and Wellsite geologist) Talked with client about client decreased energy Talked with client about kidney issues (she said she has some burning on urination) Talked with client about social support network (has support from daughter and has support from son of client) Talked with client about her energy level (she had COVID-19 and she has had 2 vaccination shots) Talked with client about sleeping challenges Talked with client about her weight loss issue Talked with client about her mood (visits occasionally with one of her neighbors)  Patient Self Care Activities:   Eats meals with set up assistance Does some  ADLs indedpendently  Patient Self Care Deficits . Transport challenges . Mobility challenges  Initial goal documentation     Materials Provided: No  Follow Up Plan: LCSW to call client or daughter of client in next 4 weeks to talk about client completion of daily ADLs  The patient verbalized understanding of instructions provided today and declined a print copy of patient instruction materials.   [name].Erica Crosby MSW, LCSW Licensed Clinical Social Worker [facility] [phone]

## 2019-12-15 NOTE — Chronic Care Management (AMB) (Signed)
Chronic Care Management    Clinical Social Work Follow Up Note  12/15/2019 Name: Erica Crosby MRN: [id number] DOB: 1945/12/19  Erica Crosby is a 74 y.o. year old female who is a primary care patient of [name], DO. The CCM team was consulted for assistance with [name] .   Review of patient status, including review of consultants reports, other relevant assessments, and collaboration with appropriate care team members and the patient's provider was performed as part of comprehensive patient evaluation and provision of chronic care management services.    SDOH (Social Determinants of Health) assessments performed: No;risk for social isolation; risk for tobacco use; risk for depression; risk for physical inactivity    Chronic Care Management from 10/21/2019 in [place]  PHQ-9 Total Score 7     GAD 7 : Generalized Anxiety Score 10/21/2019  Nervous, Anxious, on Edge 0  Control/stop worrying 0  Worry too much - different things 0  Trouble relaxing 0  Restless 0  Easily annoyed or irritable 0  Afraid - awful might happen 0  Total GAD 7 Score 0  Anxiety Difficulty Somewhat difficult    Outpatient Encounter Medications as of 12/15/2019  Medication Sig  . Accu-Chek Softclix Lancets lancets Test BS BID and prn Dx E11.21  . Adalimumab (HUMIRA PEN) 40 MG/0.4ML PNKT Inject 40 mg into the skin every 14 (fourteen) days. Can restart 06/04/19  . alendronate (FOSAMAX) 70 MG tablet Take 1 tablet (70 mg total) by mouth once a week. Take with a full glass of water on an empty stomach (Needs to be seen before next refill)  . aspirin EC 81 MG tablet Take 81 mg by mouth daily.  Marland Kitchen atorvastatin (LIPITOR) 10 MG tablet Take 1 tablet (10 mg total) by mouth daily.  . Blood Glucose Monitoring Suppl (ACCU-CHEK AVIVA PLUS) w/Device KIT Test BS BID and prn Dx E11.21  . carvedilol (COREG) 12.5 MG tablet Take 1 tablet (12.5 mg total) by mouth 2 (two) times daily with a  meal.  . enalapril (VASOTEC) 20 MG tablet Take 2 tablets (40 mg total) by mouth daily.  . folic acid (FOLVITE) 1 MG tablet Take 1 tablet (1 mg total) by mouth daily.  Marland Kitchen gabapentin (NEURONTIN) 400 MG capsule Take 1 capsule (400 mg total) by mouth at bedtime.  Marland Kitchen glucose blood (ACCU-CHEK AVIVA PLUS) test strip Test BS BID and prn Dx E11.21  . insulin glargine (LANTUS SOLOSTAR) 100 UNIT/ML Solostar Pen Inject 5-10 Units into the skin at bedtime.  Marland Kitchen leflunomide (ARAVA) 20 MG tablet Take 1 tablet (20 mg total) by mouth daily. Can restart 06/04/19  . OPTIMAL-D 1.25 MG (50000 UT) capsule TAKE 1 CAPSULE BY MOUTH ONCE A WEEK FOR 8 DOSES  . pantoprazole (PROTONIX) 40 MG tablet Take 1 tablet (40 mg total) by mouth daily.   No facility-administered encounter medications on file as of 12/15/2019.    Goals    .  Client will talk with LCSW in next 30 days to discuss ADLs completion of client (pt-stated)      CARE PLAN ENTRY   Current Barriers:   Transport challenges Ambulation challenges (uses a cane to walk) in patient with Chronic Diagnoses of Ataxia, Type 2 DM, HTN, Mixed Hyperlipidemia   Clinical Social Work Clinical Goal(s):  Marland Kitchen LCSW will call client in next 30 days to talk with client /daughter about ADLs completion of client  Interventions:  Talked with Luan Moore, daughter, previously about CCM program Talked  with [name] previously about RNCM support with CCM program Talked with client about client appetite Talked with client about client completion of ADLs Talked with client about client upcoming appointments Talked with client about hearing issues of client Talked with client about pain issues of client (she spoke of burning feeling in her feet and fingers and legs) Talked with client about DME equipment use of client (has shower chair) Talked with client about relaxation techniques of client (client likes to watch TV, does word games, crossword puzzles, enjoys shopping when she is  able, walks outside with assistance) Talked with client about transport assist services for client ([employer] and Wellsite geologist) Talked with client about client decreased energy Talked with client about kidney issues (she said she has some burning on urination) Talked with client about social support network (has support from daughter and has support from son of client) Talked with client about her energy level (she had COVID-19 and she has had 2 vaccination shots) Talked with client about sleeping challenges Talked with client about her weight loss issue Talked with client about her mood (visits occasionally with one of her neighbors)  Patient Self Care Activities:   Eats meals with set up assistance Does some ADLs indedpendently  Patient Self Care Deficits . Transport challenges . Mobility challenges  Initial goal documentation     .     Follow Up Plan: LCSW to call client or daughter of client in next 4 weeks to talk about client completion of daily ADLs  [name].Cyndie Woodbeck MSW, LCSW Licensed Clinical Social Worker [facility] Care Management [phone]

## 2019-12-17 ENCOUNTER — Telehealth: Payer: Self-pay | Admitting: Family Medicine

## 2019-12-17 NOTE — Chronic Care Management (AMB) (Signed)
  Chronic Care Management   Note  12/17/2019 Name: Erica Crosby MRN: [id number] DOB: 11-16-1945  Erica Crosby is a 74 y.o. year old female who is a primary care patient of [name], DO and is actively engaged with the care management team. I reached out to [name] by phone today to assist with scheduling an initial visit with the RN Case Manager  Follow up plan: Telephone appointment with care management team member scheduled for:01/27/2020.  [place], [place] [address] Direct Dial: [phone] [email or web address]

## 2019-12-27 DIAGNOSIS — U071 COVID-19: Secondary | ICD-10-CM | POA: Diagnosis not present

## 2019-12-29 ENCOUNTER — Telehealth: Payer: Self-pay | Admitting: Family Medicine

## 2019-12-29 NOTE — Telephone Encounter (Signed)
Pt only wants to use [facility] for her medications and not [facility]. She says she will have her daughter call [facility] to cancel the rx's that were sent over and then let us know when she needs refills sent to [facility].

## 2019-12-31 ENCOUNTER — Telehealth: Payer: Self-pay | Admitting: Family Medicine

## 2019-12-31 MED ORDER — LANTUS SOLOSTAR 100 UNIT/ML ~~LOC~~ SOPN: 5.0000 [IU] | PEN_INJECTOR | Freq: Every day | SUBCUTANEOUS | 2 refills | Status: DC

## 2019-12-31 MED ORDER — PANTOPRAZOLE SODIUM 40 MG PO TBEC: 40.0000 mg | DELAYED_RELEASE_TABLET | Freq: Every day | ORAL | 0 refills | Status: DC

## 2019-12-31 MED ORDER — ENALAPRIL MALEATE 20 MG PO TABS: 40.0000 mg | ORAL_TABLET | Freq: Every day | ORAL | 3 refills | Status: DC

## 2019-12-31 MED ORDER — GABAPENTIN 400 MG PO CAPS: 400.0000 mg | ORAL_CAPSULE | Freq: Every day | ORAL | 1 refills | Status: DC

## 2019-12-31 MED ORDER — ATORVASTATIN CALCIUM 10 MG PO TABS: 10.0000 mg | ORAL_TABLET | Freq: Every day | ORAL | 3 refills | Status: DC

## 2019-12-31 MED ORDER — CARVEDILOL 12.5 MG PO TABS: 12.5000 mg | ORAL_TABLET | Freq: Two times a day (BID) | ORAL | 3 refills | Status: DC

## 2019-12-31 MED ORDER — FOLIC ACID 1 MG PO TABS: 1.0000 mg | ORAL_TABLET | Freq: Every day | ORAL | 3 refills | Status: DC

## 2019-12-31 NOTE — Telephone Encounter (Signed)
Spoke with pt's daughter who states she did call [facility] to cancel all prescriptions for the pt so they won't request refills anymore and the refills were sent to [facility]

## 2020-01-13 ENCOUNTER — Other Ambulatory Visit: Payer: Self-pay | Admitting: Family Medicine

## 2020-01-18 ENCOUNTER — Ambulatory Visit (INDEPENDENT_AMBULATORY_CARE_PROVIDER_SITE_OTHER): Payer: Medicare Other | Admitting: Licensed Clinical Social Worker

## 2020-01-18 DIAGNOSIS — I129 Hypertensive chronic kidney disease with stage 1 through stage 4 chronic kidney disease, or unspecified chronic kidney disease: Secondary | ICD-10-CM

## 2020-01-18 DIAGNOSIS — R270 Ataxia, unspecified: Secondary | ICD-10-CM

## 2020-01-18 DIAGNOSIS — N183 Chronic kidney disease, stage 3 unspecified: Secondary | ICD-10-CM

## 2020-01-18 DIAGNOSIS — E1165 Type 2 diabetes mellitus with hyperglycemia: Secondary | ICD-10-CM | POA: Diagnosis not present

## 2020-01-18 DIAGNOSIS — Z794 Long term (current) use of insulin: Secondary | ICD-10-CM

## 2020-01-18 DIAGNOSIS — E782 Mixed hyperlipidemia: Secondary | ICD-10-CM

## 2020-01-18 DIAGNOSIS — E1121 Type 2 diabetes mellitus with diabetic nephropathy: Secondary | ICD-10-CM | POA: Diagnosis not present

## 2020-01-18 DIAGNOSIS — IMO0002 Reserved for concepts with insufficient information to code with codable children: Secondary | ICD-10-CM

## 2020-01-18 DIAGNOSIS — E1122 Type 2 diabetes mellitus with diabetic chronic kidney disease: Secondary | ICD-10-CM | POA: Diagnosis not present

## 2020-01-18 NOTE — Patient Instructions (Addendum)
Licensed Clinical Education officer, museum Visit Information  Goals we discussed today:     Client will talk with LCSW in next 30 days to discuss ADLs completion of client (pt-stated)         CARE PLAN ENTRY   Current Barriers:   Transport challenges Ambulation challenges (uses a cane to walk) in patient with Chronic Diagnoses of Ataxia, Type 2 DM, HTN, Mixed Hyperlipidemia   Clinical Social Work Clinical Goal(s):   LCSW will call client in next 30 days to talk with client /daughter about ADLs completion of client  Interventions: Talked withclientabout client appetite Talked withclientabout client completion of ADLs Talked withclientabout client upcoming appointments Talked withclientabout hearing issues of client Talked withclientabout pain issues of client(she spoke of burning feeling in her feet and fingers and legs) Talked withclientabout DME equipment use of client (has shower chair) Talked withclientabout transport assist services for client ([employer] and Wellsite geologist) Talked with client about social support network (has support from daughter and has support from son of client) Talked with client about her energy level (she had COVID-19 and she has had 2 vaccination shots) Talked with client about sleeping challenges Talked with client about her mood (talked with client about social support network) Talked with client about vision of client Talked with client about in home care needs of client Talked with client about ADTS in home aide assistance program support  Patient Self Care Activities:   Eats meals with set up assistance Does some ADLs indedpendently  Patient Self Care Deficits  Transport challenges  Mobility challenges  Initial goal documentation     Follow Up Plan: LCSW to call client or daughter of client in next 4 weeks to talk about client completion of daily ADLs  Materials Provided: No  The patient verbalized understanding of  instructions provided today and declined a print copy of patient instruction materials.   [name].Syeda Prickett MSW, LCSW Licensed Clinical Social Worker [facility] Care Management [phone]

## 2020-01-18 NOTE — Chronic Care Management (AMB) (Signed)
Chronic Care Management    Clinical Social Work Follow Up Note  01/18/2020 Name: Erica Crosby MRN: [id number] DOB: 1946/05/13  Erica Crosby is a 74 y.o. year old female who is a primary care patient of [name], DO. The CCM team was consulted for assistance with [name] .   Review of patient status, including review of consultants reports, other relevant assessments, and collaboration with appropriate care team members and the patient's provider was performed as part of comprehensive patient evaluation and provision of chronic care management services.    SDOH (Social Determinants of Health) assessments performed: No; risk for social isolation; risk for tobacco use; risk for physical inactivity; risk for depression    Chronic Care Management from 10/21/2019 in [place]  PHQ-9 Total Score 7     GAD 7 : Generalized Anxiety Score 10/21/2019  Nervous, Anxious, on Edge 0  Control/stop worrying 0  Worry too much - different things 0  Trouble relaxing 0  Restless 0  Easily annoyed or irritable 0  Afraid - awful might happen 0  Total GAD 7 Score 0  Anxiety Difficulty Somewhat difficult    Outpatient Encounter Medications as of 01/18/2020  Medication Sig  . Accu-Chek Softclix Lancets lancets Test BS BID and prn Dx E11.21  . Adalimumab (HUMIRA PEN) 40 MG/0.4ML PNKT Inject 40 mg into the skin every 14 (fourteen) days. Can restart 06/04/19  . alendronate (FOSAMAX) 70 MG tablet TAKE 1 TABLET ONCE A WEEK. TAKE WITH A FULL GLASS OF WATER ON AN EMPTY STOMACH  . aspirin EC 81 MG tablet Take 81 mg by mouth daily.  Marland Kitchen atorvastatin (LIPITOR) 10 MG tablet Take 1 tablet (10 mg total) by mouth daily.  . Blood Glucose Monitoring Suppl (ACCU-CHEK AVIVA PLUS) w/Device KIT Test BS BID and prn Dx E11.21  . carvedilol (COREG) 12.5 MG tablet Take 1 tablet (12.5 mg total) by mouth 2 (two) times daily with a meal.  . enalapril (VASOTEC) 20 MG tablet Take 2 tablets (40  mg total) by mouth daily.  . folic acid (FOLVITE) 1 MG tablet Take 1 tablet (1 mg total) by mouth daily.  Marland Kitchen gabapentin (NEURONTIN) 400 MG capsule Take 1 capsule (400 mg total) by mouth at bedtime.  Marland Kitchen glucose blood (ACCU-CHEK AVIVA PLUS) test strip Test BS BID and prn Dx E11.21  . insulin glargine (LANTUS SOLOSTAR) 100 UNIT/ML Solostar Pen Inject 5-10 Units into the skin at bedtime.  Marland Kitchen leflunomide (ARAVA) 20 MG tablet Take 1 tablet (20 mg total) by mouth daily. Can restart 06/04/19  . OPTIMAL-D 1.25 MG (50000 UT) capsule TAKE 1 CAPSULE BY MOUTH ONCE A WEEK FOR 8 DOSES  . pantoprazole (PROTONIX) 40 MG tablet Take 1 tablet (40 mg total) by mouth daily.   No facility-administered encounter medications on file as of 01/18/2020.     Goals    .  Client will talk with LCSW in next 30 days to discuss ADLs completion of client (pt-stated)      CARE PLAN ENTRY   Current Barriers:   Transport challenges Ambulation challenges (uses a cane to walk) in patient with Chronic Diagnoses of Ataxia, Type 2 DM, HTN, Mixed Hyperlipidemia   Clinical Social Work Clinical Goal(s):  Marland Kitchen LCSW will call client in next 30 days to talk with client /daughter about ADLs completion of client  Interventions: Talked with client about client appetite Talked with client about client completion of ADLs Talked with client about client upcoming appointments  Talked with client about hearing issues of client Talked with client about pain issues of client (she spoke of burning feeling in her feet and fingers and legs) Talked with client about DME equipment use of client (has shower chair) Talked with client about transport assist services for client ([employer] and Wellsite geologist) Talked with client about social support network (has support from daughter and has support from son of client) Talked with client about her energy level (she had COVID-19 and she has had 2 vaccination shots) Talked with client about sleeping challenges Talked  with client about her mood (talked with client about social support network) Talked with client about vision of client Talked with client about in home care needs of client Talked with client about ADTS in home aide assistance program support  Patient Self Care Activities:   Eats meals with set up assistance Does some ADLs indedpendently  Patient Self Care Deficits . Transport challenges . Mobility challenges  Initial goal documentation     Follow Up Plan: LCSW to call client or daughter of client in next 4 weeks to talk about client completion of daily ADLs  [name].Tory Mckissack MSW, LCSW Licensed Clinical Social Worker [facility] Care Management [phone]

## 2020-01-27 ENCOUNTER — Telehealth: Payer: Medicare Other

## 2020-01-27 DIAGNOSIS — U071 COVID-19: Secondary | ICD-10-CM | POA: Diagnosis not present

## 2020-01-28 ENCOUNTER — Other Ambulatory Visit: Payer: Self-pay | Admitting: Family Medicine

## 2020-01-28 DIAGNOSIS — Z1231 Encounter for screening mammogram for malignant neoplasm of breast: Secondary | ICD-10-CM

## 2020-02-18 ENCOUNTER — Ambulatory Visit (INDEPENDENT_AMBULATORY_CARE_PROVIDER_SITE_OTHER): Payer: Medicare Other | Admitting: Licensed Clinical Social Worker

## 2020-02-18 DIAGNOSIS — E1169 Type 2 diabetes mellitus with other specified complication: Secondary | ICD-10-CM

## 2020-02-18 DIAGNOSIS — I129 Hypertensive chronic kidney disease with stage 1 through stage 4 chronic kidney disease, or unspecified chronic kidney disease: Secondary | ICD-10-CM

## 2020-02-18 DIAGNOSIS — E782 Mixed hyperlipidemia: Secondary | ICD-10-CM

## 2020-02-18 DIAGNOSIS — R270 Ataxia, unspecified: Secondary | ICD-10-CM

## 2020-02-18 NOTE — Chronic Care Management (AMB) (Signed)
Chronic Care Management    Clinical Social Work Follow Up Note  02/18/2020 Name: Erica Crosby MRN: [id number] DOB: 1946/03/25  Erica Crosby is a 74 y.o. year old female who is a primary care patient of [name], DO. The CCM team was consulted for assistance with [name] .   Review of patient status, including review of consultants reports, other relevant assessments, and collaboration with appropriate care team members and the patient's provider was performed as part of comprehensive patient evaluation and provision of chronic care management services.    SDOH (Social Determinants of Health) assessments performed: No;risk for tobacco use; risk for depression; risk for stress; risk for physical inactivity    Chronic Care Management from 10/21/2019 in [place]  PHQ-9 Total Score 7     GAD 7 : Generalized Anxiety Score 10/21/2019  Nervous, Anxious, on Edge 0  Control/stop worrying 0  Worry too much - different things 0  Trouble relaxing 0  Restless 0  Easily annoyed or irritable 0  Afraid - awful might happen 0  Total GAD 7 Score 0  Anxiety Difficulty Somewhat difficult    Outpatient Encounter Medications as of 02/18/2020  Medication Sig  . Accu-Chek Softclix Lancets lancets Test BS BID and prn Dx E11.21  . Adalimumab (HUMIRA PEN) 40 MG/0.4ML PNKT Inject 40 mg into the skin every 14 (fourteen) days. Can restart 06/04/19  . alendronate (FOSAMAX) 70 MG tablet TAKE 1 TABLET ONCE A WEEK. TAKE WITH A FULL GLASS OF WATER ON AN EMPTY STOMACH  . aspirin EC 81 MG tablet Take 81 mg by mouth daily.  Marland Kitchen atorvastatin (LIPITOR) 10 MG tablet Take 1 tablet (10 mg total) by mouth daily.  . Blood Glucose Monitoring Suppl (ACCU-CHEK AVIVA PLUS) w/Device KIT Test BS BID and prn Dx E11.21  . carvedilol (COREG) 12.5 MG tablet Take 1 tablet (12.5 mg total) by mouth 2 (two) times daily with a meal.  . enalapril (VASOTEC) 20 MG tablet Take 2 tablets (40 mg total) by  mouth daily.  . folic acid (FOLVITE) 1 MG tablet Take 1 tablet (1 mg total) by mouth daily.  Marland Kitchen gabapentin (NEURONTIN) 400 MG capsule Take 1 capsule (400 mg total) by mouth at bedtime.  Marland Kitchen glucose blood (ACCU-CHEK AVIVA PLUS) test strip Test BS BID and prn Dx E11.21  . insulin glargine (LANTUS SOLOSTAR) 100 UNIT/ML Solostar Pen Inject 5-10 Units into the skin at bedtime.  Marland Kitchen leflunomide (ARAVA) 20 MG tablet Take 1 tablet (20 mg total) by mouth daily. Can restart 06/04/19  . OPTIMAL-D 1.25 MG (50000 UT) capsule TAKE 1 CAPSULE BY MOUTH ONCE A WEEK FOR 8 DOSES  . pantoprazole (PROTONIX) 40 MG tablet Take 1 tablet (40 mg total) by mouth daily.   No facility-administered encounter medications on file as of 02/18/2020.    Goals    .  Client will talk with LCSW in next 30 days to discuss ADLs completion of client (pt-stated)      CARE PLAN ENTRY   Current Barriers:   Transport challenges Ambulation challenges (uses a cane to walk) in patient with Chronic Diagnoses of Ataxia, Type 2 DM, HTN, Mixed Hyperlipidemia   Clinical Social Work Clinical Goal(s):  Marland Kitchen LCSW will call client in next 30 days to talk with client /daughter about ADLs completion of client  Interventions:  Talked with client about RNCM support with CCM program Talked with client about client appetite Talked with client about client completion of ADLs  Talked with client about client upcoming appointments Talked with client about hearing issues of client Talked with client about pain issues of client Talked with client about DME equipment use of client (has shower chair) Talked with client about relaxation techniques of client (client likes to watch TV, does word games, crossword puzzles, enjoys shopping when she is able, walks outside with assistance) Talked with client about transport assist services for client ([employer] and [employer]) Talked with client about client decreased energy Talked with client about pain occasionally in  her fingers (on both hands. She said she does hand exercises to help with pain in her fingers) Talked with [place] about ambulation (she uses a cane to help her walk) Talked with client about social support network (daughter is supportive; daughter in law is supportive) Talked with client about physical therapy sessions previously received by client  Patient Self Care Activities:   Eats meals with set up assistance Does some ADLs indedpendently  Patient Self Care Deficits . Transport challenges . Mobility challenges  Initial goal documentation     Follow Up Plan: LCSW to call client or daughter of client in next 4 weeks to talk about client completion of daily ADLs  [name].Cayleen Benjamin MSW, LCSW Licensed Clinical Social Worker [facility] Care Management [phone]

## 2020-02-18 NOTE — Patient Instructions (Addendum)
Licensed Clinical Education officer, museum Visit Information  Goals we discussed today:    Client will talk with LCSW in next 30 days to discuss ADLs completion of client (pt-stated)         CARE PLAN ENTRY   Current Barriers:   Transport challenges Ambulation challenges (uses a cane to walk) in patient with Chronic Diagnoses of Ataxia, Type 2 DM, HTN, Mixed Hyperlipidemia   Clinical Social Work Clinical Goal(s):   LCSW will call client in next 30 days to talk with client /daughter about ADLs completion of client  Interventions:  Talked with client about RNCM support with CCM program Talked with client about client appetite Talked with client about client completion of ADLs Talked with client about client upcoming appointments Talked with client about hearing issues of client Talked with client about pain issues of client Talked with client about DME equipment use of client (has shower chair) Talked with client about relaxation techniques of client (client likes to watch TV, does word games, crossword puzzles, enjoys shopping when she is able, walks outside with assistance) Talked with client about transport assist services for client ([employer] and [employer]) Talked with client about client decreased energy Talked with client about pain occasionally in her fingers (on both hands. She said she does hand exercises to help with pain in her fingers) Talked with Jeania about ambulation (she uses a cane to help her walk) Talked with client about social support network (daughter is supportive; daughter in law is supportive) Talked with client about physical therapy sessions previously received by client  Patient Self Care Activities:   Eats meals with set up assistance Does some ADLs indedpendently  Patient Self Care Deficits  Transport challenges  Mobility challenges  Initial goal documentation     Follow Up Plan: LCSW to call client or daughter of client in next 4 weeks to  talk about client completion of daily ADLs  Materials Provided: No  The patient verbalized understanding of instructions provided today and declined a print copy of patient instruction materials.   [name].Ranny Wiebelhaus MSW, LCSW Licensed Clinical Social Worker [facility] Care Management [phone]

## 2020-02-27 DIAGNOSIS — U071 COVID-19: Secondary | ICD-10-CM | POA: Diagnosis not present

## 2020-03-01 ENCOUNTER — Other Ambulatory Visit: Payer: Self-pay

## 2020-03-01 ENCOUNTER — Ambulatory Visit (INDEPENDENT_AMBULATORY_CARE_PROVIDER_SITE_OTHER): Payer: Medicare Other | Admitting: *Deleted

## 2020-03-01 DIAGNOSIS — Z0000 Encounter for general adult medical examination without abnormal findings: Secondary | ICD-10-CM | POA: Diagnosis not present

## 2020-03-01 NOTE — Progress Notes (Signed)
MEDICARE ANNUAL WELLNESS VISIT  03/01/2020  Telephone Visit Disclaimer This Medicare AWV was conducted by telephone due to national recommendations for restrictions regarding the COVID-19 Pandemic (e.g. social distancing).  I verified, using two identifiers, that I am speaking with Erica Crosby or their authorized healthcare agent. I discussed the limitations, risks, security, and privacy concerns of performing an evaluation and management service by telephone and the potential availability of an in-person appointment in the future. The patient expressed understanding and agreed to proceed.  Location of Patient: home Location of Provider (nurse): office  Subjective:    Erica Crosby is a 74 y.o. female patient of Erica Norlander, DO who had a Medicare Annual Wellness Visit today via telephone. Alyria is Retired and lives with their daughter. she has 3 children. she reports that she is not socially active and does interact with friends/family regularly. she is not physically active and enjoys jigsaw puzzles.  Patient Care Team: Erica Norlander, DO as PCP - General (Family Medicine) Minus Breeding, MD as PCP - Cardiology (Cardiology) Valinda Party, MD as Consulting Physician (Rheumatology) Cassandria Anger, MD as Consulting Physician (Endocrinology) Shea Evans, [name], LCSW as [facility] (Licensed Clinical Social Worker) Ilean China, RN as Registered Nurse  Advanced Directives 03/01/2020 05/21/2019 02/10/2019 02/23/2018 08/14/2017 01/21/2017 11/14/2016  Does Patient Have a Medical Advance Directive? No No No No No No No  Would patient like information on creating a medical advance directive? No - Patient declined No - Patient declined No - Patient declined No - Patient declined No - Patient declined - -    Hospital Utilization Over the Past 12 Months: # of hospitalizations or ER visits: 1 # of surgeries: 0  Review of Systems    Patient reports  that her overall health is better compared to last year.  History obtained from chart review and the patient  Patient Reported Readings (BP, Pulse, CBG, Weight, etc) CBG 150  Pain Assessment Pain : No/denies pain     Current Medications & Allergies (verified) Allergies as of 03/01/2020   No Known Allergies     Medication List       Accurate as of March 01, 2020  2:10 PM. If you have any questions, ask your nurse or doctor.        Accu-Chek Aviva Plus test strip Generic drug: glucose blood Test BS BID and prn Dx E11.21   Accu-Chek Aviva Plus w/Device Kit Test BS BID and prn Dx E11.21   Accu-Chek Softclix Lancets lancets Test BS BID and prn Dx E11.21   alendronate 70 MG tablet Commonly known as: FOSAMAX TAKE 1 TABLET ONCE A WEEK. TAKE WITH A FULL GLASS OF WATER ON AN EMPTY STOMACH   aspirin EC 81 MG tablet Take 81 mg by mouth daily.   atorvastatin 10 MG tablet Commonly known as: LIPITOR Take 1 tablet (10 mg total) by mouth daily.   carvedilol 12.5 MG tablet Commonly known as: COREG Take 1 tablet (12.5 mg total) by mouth 2 (two) times daily with a meal.   enalapril 20 MG tablet Commonly known as: VASOTEC Take 2 tablets (40 mg total) by mouth daily.   folic acid 1 MG tablet Commonly known as: FOLVITE Take 1 tablet (1 mg total) by mouth daily.   gabapentin 400 MG capsule Commonly known as: NEURONTIN Take 1 capsule (400 mg total) by mouth at bedtime.   Humira Pen 40 MG/0.4ML Pnkt Generic drug: Adalimumab  Inject 40 mg into the skin every 14 (fourteen) days. Can restart 06/04/19   Lantus SoloStar 100 UNIT/ML Solostar Pen Generic drug: insulin glargine Inject 5-10 Units into the skin at bedtime.   leflunomide 20 MG tablet Commonly known as: ARAVA Take 1 tablet (20 mg total) by mouth daily. Can restart 06/04/19   Optimal-D 1.25 MG (50000 UT) capsule Generic drug: Cholecalciferol TAKE 1 CAPSULE BY MOUTH ONCE A WEEK FOR 8 DOSES   pantoprazole 40 MG  tablet Commonly known as: PROTONIX Take 1 tablet (40 mg total) by mouth daily.       History (reviewed): Past Medical History:  Diagnosis Date  . Anemia   . Arthritis   . Cataract   . Coronary artery disease    Mild plaque 2012  . Diabetes mellitus   . Essential hypertension 02/10/2012  . GERD (gastroesophageal reflux disease)   . Hyperlipidemia   . Hypertension   . Neuropathy   . Peptic ulcer   . Rheumatoid arthritis ([place])   . TIA (transient ischemic attack) 02/10/2012   Past Surgical History:  Procedure Laterality Date  . ABSCESS DRAINAGE    . CARPAL TUNNEL RELEASE Left   . CHOLECYSTECTOMY    . KNEE SURGERY Right   . NECK SURGERY     x 2  . SHOULDER SURGERY Left    Family History  Problem Relation Age of Onset  . Heart disease Sister        Congeital (died age 73) with "enlarged heart"  . CAD Sister   . Diabetes Mother   . Heart disease Mother        Later onset heart disease   . Dementia Mother   . Alcohol abuse Father   . Liver disease Father   . CAD Sister 68       CABG  . Early death Sister   . Diabetes Brother   . Kidney disease Brother        related to DM  . Diabetes Brother    Social History   Socioeconomic History  . Marital status: Widowed    Spouse name: Not on file  . Number of children: 3  . Years of education: 55  . Highest education level: High school graduate  Occupational History  . Occupation: retired  Tobacco Use  . Smoking status: Former Smoker    Years: 25.00    Types: Cigarettes, E-cigarettes    Quit date: 02/02/2013    Years since quitting: 7.0  . Smokeless tobacco: Never Used  Vaping Use  . Vaping Use: Never used  Substance and Sexual Activity  . Alcohol use: No  . Drug use: No  . Sexual activity: Not Currently    Birth control/protection: Post-menopausal  Other Topics Concern  . Not on file  Social History Narrative   Daughter stays with her.    Social Determinants of Health   Financial Resource Strain: Low  Risk   . Difficulty of Paying Living Expenses: Not very hard  Food Insecurity: No Food Insecurity  . Worried About Charity fundraiser in the Last Year: Never true  . Ran Out of Food in the Last Year: Never true  Transportation Needs: No Transportation Needs  . Lack of Transportation (Medical): No  . Lack of Transportation (Non-Medical): No  Physical Activity: Inactive  . Days of Exercise per Week: 0 days  . Minutes of Exercise per Session: 0 min  Stress: No Stress Concern Present  . Feeling of Stress :  Only a little  Social Connections: Moderately Isolated  . Frequency of Communication with Friends and Family: Three times a week  . Frequency of Social Gatherings with Friends and Family: Twice a week  . Attends Religious Services: More than 4 times per year  . Active Member of Clubs or Organizations: No  . Attends Archivist Meetings: Never  . Marital Status: Widowed    Activities of Daily Living In your present state of health, do you have any difficulty performing the following activities: 03/01/2020 05/21/2019  Hearing? N N  Vision? Y N  Difficulty concentrating or making decisions? Tempie Donning  Walking or climbing stairs? N N  Dressing or bathing? N Y  Doing errands, shopping? Y Y  Comment doesn't drive since covid -  Conservation officer, nature and eating ? N -  Using the Toilet? N -  In the past six months, have you accidently leaked urine? N -  Do you have problems with loss of bowel control? N -  Managing your Medications? N -  Managing your Finances? Y -  Comment daughter -  Housekeeping or managing your Housekeeping? N -  Some recent data might be hidden    Patient Education/ Literacy How often do you need to have someone help you when you read instructions, pamphlets, or other written materials from your doctor or pharmacy?: 1 - Never What is the last grade level you completed in school?: 12  Exercise Current Exercise Habits: The patient does not participate in regular  exercise at present, Exercise limited by: orthopedic condition(s) (back pain)  Diet Patient reports consuming 2 meals a day and 1 snack(s) a day Patient reports that her primary diet is: Diabetic Patient reports that she does have regular access to food.   Depression Screen PHQ 2/9 Scores 03/01/2020 12/09/2019 10/21/2019 03/11/2019 02/10/2019 07/07/2018 03/06/2018  PHQ - 2 Score 0 0 2 0 0 0 0  PHQ- 9 Score - - 7 0 - 0 -     Fall Risk Fall Risk  03/01/2020 12/09/2019 03/11/2019 02/10/2019 07/07/2018  Falls in the past year? 0 0 1 1 0  Number falls in past yr: - - 1 1 -  Injury with Fall? - - 0 0 -  Comment - - - - -  Risk Factor Category  - - - - -  Risk for fall due to : - - - Impaired mobility -  Risk for fall due to: Comment - - - - -  Follow up - - - Falls prevention discussed -  Comment - - - advised to get rid of any throw rugs, loose cords in walkway, having adequate lighting and grabrails in the bathroom -     Objective:  Erica Crosby seemed alert and oriented and she participated appropriately during our telephone visit.  Blood Pressure Weight BMI  BP Readings from Last 3 Encounters:  12/09/19 (!) 148/67  10/16/19 115/78  10/09/19 (!) 167/69   Wt Readings from Last 3 Encounters:  12/09/19 122 lb 12.8 oz (55.7 kg)  10/09/19 119 lb (54 kg)  05/25/19 126 lb 12.2 oz (57.5 kg)   BMI Readings from Last 1 Encounters:  12/09/19 19.23 kg/m    *Unable to obtain current vital signs, weight, and BMI due to telephone visit type  Hearing/Vision  . Donise did not seem to have difficulty with hearing/understanding during the telephone conversation . Reports that she has not had a formal eye exam by an eye care professional within the  past year . Reports that she has not had a formal hearing evaluation within the past year *Unable to fully assess hearing and vision during telephone visit type  Cognitive Function: 6CIT Screen 03/01/2020 02/10/2019  What Year? 0 points 0 points  What month? 0  points 0 points  What time? 0 points 0 points  Count back from 20 0 points 0 points  Months in reverse 4 points 0 points  Repeat phrase 2 points 6 points  Total Score 6 6   (Normal:0-7, Significant for Dysfunction: >8)  Normal Cognitive Function Screening: Yes   Immunization & Health Maintenance Record Immunization History  Administered Date(s) Administered  . Influenza, High Dose Seasonal PF 03/11/2018, 02/19/2019  . Influenza,inj,Quad PF,6+ Mos 03/18/2016, 03/20/2017  . Moderna SARS-COVID-2 Vaccination 07/24/2019, 08/21/2019  . Pneumococcal Conjugate-13 04/21/2016  . Pneumococcal Polysaccharide-23 08/14/2017, 01/03/2018  . Zoster Recombinat (Shingrix) 01/03/2018, 03/11/2018    Health Maintenance  Topic Date Due  . Hepatitis C Screening  Never done  . DEXA SCAN  Never done  . OPHTHALMOLOGY EXAM  06/26/2017  . MAMMOGRAM  01/31/2018  . TETANUS/TDAP  03/10/2020 (Originally 04/12/1965)  . HEMOGLOBIN A1C  06/09/2020  . FOOT EXAM  12/08/2020  . COLONOSCOPY  08/14/2026  . COVID-19 Vaccine  Completed  . PNA vac Low Risk Adult  Completed  . INFLUENZA VACCINE  Discontinued       Assessment  This is a routine wellness examination for TAMYRAH BURBAGE.  Health Maintenance: Due or Overdue Health Maintenance Due  Topic Date Due  . Hepatitis C Screening  Never done  . DEXA SCAN  Never done  . OPHTHALMOLOGY EXAM  06/26/2017  . MAMMOGRAM  01/31/2018    Erica Crosby does not need a referral for Community Assistance: Care Management:   no Social Work:    no Prescription Assistance:  no Nutrition/Diabetes Education:  no   Plan:  Personalized Goals Goals Addressed   None    Personalized Health Maintenance & Screening Recommendations  Bone densitometry screening  Diabetic eye exam Flu vaccine  Lung Cancer Screening Recommended: no (Low Dose CT Chest recommended if Age 72-80 years, 30 pack-year currently smoking OR have quit w/in past 15 years) Hepatitis C Screening  recommended: yes HIV Screening recommended: no  Advanced Directives: Written information was not prepared per patient's request.  Referrals & Orders No orders of the defined types were placed in this encounter.   Follow-up Plan . Follow-up with Erica Norlander, DO as planned. . Pt needs Dexa scan, flu vaccine and diabetic eye exam. . Pt continues to be independent. She walks with a walker. She stopped driving after she had the covid virus. . Her daughter lives with her. . Pt declines advanced directives information. . Pt voices no concerns about health today. . AVS printed and mailed to pt     I have personally reviewed and noted the following in the patient's chart:   . Medical and social history . Use of alcohol, tobacco or illicit drugs  . Current medications and supplements . Functional ability and status . Nutritional status . Physical activity . Advanced directives . List of other physicians . Hospitalizations, surgeries, and ER visits in previous 12 months . Vitals . Screenings to include cognitive, depression, and falls . Referrals and appointments  In addition, I have reviewed and discussed with Erica Crosby certain preventive protocols, quality metrics, and best practice recommendations. A written personalized care plan for preventive services as well as general preventive  health recommendations is available and can be mailed to the patient at her request.      Rana Snare, LPN  [id number]

## 2020-03-03 DIAGNOSIS — Z79899 Other long term (current) drug therapy: Secondary | ICD-10-CM | POA: Diagnosis not present

## 2020-03-03 DIAGNOSIS — M069 Rheumatoid arthritis, unspecified: Secondary | ICD-10-CM | POA: Diagnosis not present

## 2020-03-03 DIAGNOSIS — M810 Age-related osteoporosis without current pathological fracture: Secondary | ICD-10-CM | POA: Diagnosis not present

## 2020-03-03 DIAGNOSIS — E119 Type 2 diabetes mellitus without complications: Secondary | ICD-10-CM | POA: Diagnosis not present

## 2020-03-03 DIAGNOSIS — M0510 Rheumatoid lung disease with rheumatoid arthritis of unspecified site: Secondary | ICD-10-CM | POA: Diagnosis not present

## 2020-03-15 ENCOUNTER — Telehealth: Payer: Self-pay | Admitting: *Deleted

## 2020-03-15 ENCOUNTER — Ambulatory Visit: Payer: Medicare Other | Admitting: *Deleted

## 2020-03-15 DIAGNOSIS — Z794 Long term (current) use of insulin: Secondary | ICD-10-CM

## 2020-03-15 DIAGNOSIS — E1169 Type 2 diabetes mellitus with other specified complication: Secondary | ICD-10-CM

## 2020-03-15 DIAGNOSIS — I129 Hypertensive chronic kidney disease with stage 1 through stage 4 chronic kidney disease, or unspecified chronic kidney disease: Secondary | ICD-10-CM

## 2020-03-15 DIAGNOSIS — N183 Chronic kidney disease, stage 3 unspecified: Secondary | ICD-10-CM

## 2020-03-15 DIAGNOSIS — E1122 Type 2 diabetes mellitus with diabetic chronic kidney disease: Secondary | ICD-10-CM | POA: Diagnosis not present

## 2020-03-15 DIAGNOSIS — E782 Mixed hyperlipidemia: Secondary | ICD-10-CM

## 2020-03-15 NOTE — Telephone Encounter (Signed)
03/15/2020  Spoke with patient by telephone today. She complains of generalized abdominal pain and that her "bowels are bubbling" for > 1 month. Bowel movements are typically every-other-day. She denies straining but says she usually has to sit on the toilet for a while before she can pass stool. Denies any nausea, vomiting, black/tarry stools, or weight loss. Positive for decreased appetite. Has taken Pepto Bismol.   Discouraged Pepto Bismol use. Recommended simethicone and increasing water and fiber intake. Advised that fiber will increase gas which may make "bubbling" worse. Recommended appointment with PCP or GI. Referred for colonoscopy in 08/2016 but I don't believe she saw GI then.    Forwarding to [facility] Clinical staff for review and action.   Chong Sicilian, BSN, RN-BC Embedded Chronic Care Manager [facility] Direct Dial: [phone]

## 2020-03-15 NOTE — Telephone Encounter (Signed)
Patient states she will call back to make appt.

## 2020-03-15 NOTE — Patient Instructions (Signed)
Visit Information  Goals Addressed              This Visit's Progress     Patient Stated   .  "I'm having problems with my stomach" (pt-stated)        CARE PLAN ENTRY (see longitudinal plan of care for additional care plan information)  Current Barriers:  . Care Coordination needs related to GI upset in a patient with diabetes and hypertension  Nurse Case Manager Clinical Goal(s):  Marland Kitchen Over the next 14 days, patient will work with PCP regarding abdominal discomfort and potential constipation  Interventions:  . Inter-disciplinary care team collaboration (see longitudinal plan of care) . Chart reviewed including recent office notes and lab results . Referrals reviewed o Referred to GI for colonoscopy in 08/2016 but it doesn't appear that she had that done . Discussed Bowel movements o Reports BM every other day. Does not have to strain necessarily but does have to sit on the toilet for an extended period of time o Denies black tarry stools, weight loss, nausea or vomiting. o Positive for generalized abdominal pain, loss of appetite, and "bowels bubbling".  o Symptoms > 1 month . Discussed OTC treatments o Has taken pepto bismol . Reviewed medications . Advised against pepto bismol. Encouraged increasing water and fiber intake. Advised that fiber may increase gas and "bubbling". Recommended simethicone. Hold off on any other OTC meds until talks with provider.  . Recommended visit with PCP or GI . Collaborated with [facility] clinical staff to schedule patient an appointment and forwarded telephone encounter to PCP as an FYI . Encouraged patient to reach out to PCP with any new or worsening symptoms or seek emergency medical care if necessary . Provided with RNCM contact number and encouraged to reach out as needed   Patient Self Care Activities:  . Performs ADL's independently . Performs IADL's independently  Initial goal documentation       Other   .  chronic Disease Management  Needs        CARE PLAN ENTRY (see longtitudinal plan of care for additional care plan information)  Current Barriers:  . 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 Work with the care management team to address educational, disease management, and care coordination needs  . Begin or continue self health monitoring activities as directed today Measure and record cbg (blood glucose) 2 times daily and Measure and record blood pressure 4 times per week . Call provider office for new or worsened signs and symptoms Blood glucose findings outside established parameters and Blood pressure findings outside established parameters . Call care management team with questions or concerns . 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 Evaluation of current treatment plans and patient's adherence to plan as established by provider . Assessed patient understanding of disease states . Assessed patient's education and care coordination needs . Provided disease specific education to patient  . Collaborated with appropriate clinical care team members regarding patient needs . Chart reviewed . Reviewed medications . Discussed mobility and ability to perform ADLs . Discussed GI complaints. Collaborated with PCP office regarding GI complaints.   Patient Self Care Activities related to Ataxia, Type 2 DM, HTN, Mixed  hyperlipidemia:  . Patient is unable to independently self-manage chronic health conditions  Initial goal documentation        Patient verbalizes understanding of instructions provided today.   Follow-up Plan The care management team will reach out to the patient again over the next  15 days.   Chong Sicilian, BSN, RN-BC Embedded Chronic Care Manager [name] [place] [facility] Direct Dial: [phone]

## 2020-03-15 NOTE — Chronic Care Management (AMB) (Signed)
Chronic Care Management   Follow Up Note   03/15/2020 Name: Erica Crosby MRN: [id number] DOB: 07-15-1945  Referred by: [name], DO Reason for referral : Chronic Care Management (RN follow up)   Erica Crosby is a 74 y.o. year old female who is a primary care patient of [name], DO. The CCM team was consulted for assistance with chronic disease management and care coordination needs.    Review of patient status, including review of consultants reports, relevant laboratory and other test results, and collaboration with appropriate care team members and the patient's provider was performed as part of comprehensive patient evaluation and provision of chronic care management services.    SDOH (Social Determinants of Health) assessments performed: No See Care Plan activities for detailed interventions related to [facility])     Outpatient Encounter Medications as of 03/15/2020  Medication Sig  . Accu-Chek Softclix Lancets lancets Test BS BID and prn Dx E11.21  . Adalimumab (HUMIRA PEN) 40 MG/0.4ML PNKT Inject 40 mg into the skin every 14 (fourteen) days. Can restart 06/04/19  . alendronate (FOSAMAX) 70 MG tablet TAKE 1 TABLET ONCE A WEEK. TAKE WITH A FULL GLASS OF WATER ON AN EMPTY STOMACH  . aspirin EC 81 MG tablet Take 81 mg by mouth daily.  Marland Kitchen atorvastatin (LIPITOR) 10 MG tablet Take 1 tablet (10 mg total) by mouth daily.  . Blood Glucose Monitoring Suppl (ACCU-CHEK AVIVA PLUS) w/Device KIT Test BS BID and prn Dx E11.21  . carvedilol (COREG) 12.5 MG tablet Take 1 tablet (12.5 mg total) by mouth 2 (two) times daily with a meal.  . enalapril (VASOTEC) 20 MG tablet Take 2 tablets (40 mg total) by mouth daily.  . folic acid (FOLVITE) 1 MG tablet Take 1 tablet (1 mg total) by mouth daily.  Marland Kitchen gabapentin (NEURONTIN) 400 MG capsule Take 1 capsule (400 mg total) by mouth at bedtime.  Marland Kitchen glucose blood (ACCU-CHEK AVIVA PLUS) test strip Test BS BID and prn Dx E11.21  . insulin  glargine (LANTUS SOLOSTAR) 100 UNIT/ML Solostar Pen Inject 5-10 Units into the skin at bedtime.  Marland Kitchen leflunomide (ARAVA) 20 MG tablet Take 1 tablet (20 mg total) by mouth daily. Can restart 06/04/19  . OPTIMAL-D 1.25 MG (50000 UT) capsule TAKE 1 CAPSULE BY MOUTH ONCE A WEEK FOR 8 DOSES  . pantoprazole (PROTONIX) 40 MG tablet Take 1 tablet (40 mg total) by mouth daily.   No facility-administered encounter medications on file as of 03/15/2020.    BP Readings from Last 3 Encounters:  12/09/19 (!) 148/67  10/16/19 115/78  10/09/19 (!) 167/69   Lab Results  Component Value Date   HGBA1C 6.1 12/09/2019   HGBA1C 8.3 (H) 03/11/2019   HGBA1C 11.5 (H) 07/07/2018   Lab Results  Component Value Date   LDLCALC 71 03/11/2019   CREATININE 1.74 (H) 12/09/2019    RN Care Plan: Goals Addressed              This Visit's Progress     Patient Stated   .  "I'm having problems with my stomach" (pt-stated)        CARE PLAN ENTRY (see longitudinal plan of care for additional care plan information)  Current Barriers:  . Care Coordination needs related to GI upset in a patient with diabetes and hypertension  Nurse Case Manager Clinical Goal(s):  Marland Kitchen Over the next 14 days, patient will work with PCP regarding abdominal discomfort and potential constipation  Interventions:  .  Inter-disciplinary care team collaboration (see longitudinal plan of care) . Chart reviewed including recent office notes and lab results . Referrals reviewed o Referred to GI for colonoscopy in 08/2016 but it doesn't appear that she had that done . Discussed Bowel movements o Reports BM every other day. Does not have to strain necessarily but does have to sit on the toilet for an extended period of time o Denies black tarry stools, weight loss, nausea or vomiting. o Positive for generalized abdominal pain, loss of appetite, and "bowels bubbling".  o Symptoms > 1 month . Discussed OTC treatments o Has taken pepto  bismol . Reviewed medications . Advised against pepto bismol. Encouraged increasing water and fiber intake. Advised that fiber may increase gas and "bubbling". Recommended simethicone. Hold off on any other OTC meds until talks with provider.  . Recommended visit with PCP or GI . Collaborated with [facility] clinical staff to schedule patient an appointment and forwarded telephone encounter to PCP as an FYI . Encouraged patient to reach out to PCP with any new or worsening symptoms or seek emergency medical care if necessary . Provided with RNCM contact number and encouraged to reach out as needed   Patient Self Care Activities:  . Performs ADL's independently . Performs IADL's independently  Initial goal documentation     .  chronic Disease Management Needs        CARE PLAN ENTRY (see longtitudinal plan of care for additional care plan information)  Current Barriers:  . 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 Work with the care management team to address educational, disease management, and care coordination needs  . Begin or continue self health monitoring activities as directed today Measure and record cbg (blood glucose) 2 times daily and Measure and record blood pressure 4 times per week . Call provider office for new or worsened signs and symptoms Blood glucose findings outside established parameters and Blood pressure findings outside established parameters . Call care management team with questions or concerns . 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 Evaluation of current treatment plans and patient's adherence to plan as established by provider . Assessed  patient understanding of disease states . Assessed patient's education and care coordination needs . Provided disease specific education to patient  . Collaborated with appropriate clinical care team members regarding patient needs . Chart reviewed . Reviewed medications . Discussed mobility and ability to perform ADLs . Discussed GI complaints. Collaborated with PCP office regarding GI complaints.   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 Patient is unable to independently self-manage chronic health conditions  Initial goal documentation         Plan:   The care management team will reach out to the patient again over the next 15 days.    Chong Sicilian, BSN, RN-BC Embedded Chronic Care Manager [facility] Direct Dial: [phone]

## 2020-03-16 ENCOUNTER — Ambulatory Visit
Admission: RE | Admit: 2020-03-16 | Discharge: 2020-03-16 | Disposition: A | Discharge status: Home / Self Care | Payer: Medicare Other | Source: Ambulatory Visit | Attending: Family Medicine | Admitting: Family Medicine

## 2020-03-16 ENCOUNTER — Other Ambulatory Visit: Payer: Self-pay

## 2020-03-16 DIAGNOSIS — Z1231 Encounter for screening mammogram for malignant neoplasm of breast: Secondary | ICD-10-CM | POA: Diagnosis not present

## 2020-03-24 ENCOUNTER — Ambulatory Visit (INDEPENDENT_AMBULATORY_CARE_PROVIDER_SITE_OTHER): Payer: Medicare Other | Admitting: Licensed Clinical Social Worker

## 2020-03-24 DIAGNOSIS — E1169 Type 2 diabetes mellitus with other specified complication: Secondary | ICD-10-CM

## 2020-03-24 DIAGNOSIS — R270 Ataxia, unspecified: Secondary | ICD-10-CM

## 2020-03-24 DIAGNOSIS — I129 Hypertensive chronic kidney disease with stage 1 through stage 4 chronic kidney disease, or unspecified chronic kidney disease: Secondary | ICD-10-CM | POA: Diagnosis not present

## 2020-03-24 DIAGNOSIS — E1122 Type 2 diabetes mellitus with diabetic chronic kidney disease: Secondary | ICD-10-CM | POA: Diagnosis not present

## 2020-03-24 DIAGNOSIS — Z794 Long term (current) use of insulin: Secondary | ICD-10-CM

## 2020-03-24 DIAGNOSIS — N183 Chronic kidney disease, stage 3 unspecified: Secondary | ICD-10-CM | POA: Diagnosis not present

## 2020-03-24 DIAGNOSIS — E782 Mixed hyperlipidemia: Secondary | ICD-10-CM | POA: Diagnosis not present

## 2020-03-24 NOTE — Telephone Encounter (Signed)
Appt made.  Patient aware  

## 2020-03-24 NOTE — Chronic Care Management (AMB) (Signed)
Chronic Care Management    Clinical Social Work Follow Up Note  03/24/2020 Name: Erica Crosby MRN: [id number] DOB: Jan 20, 1946  Erica Crosby is a 74 y.o. year old female who is a primary care patient of [name], DO. The CCM team was consulted for assistance with [name] .   Review of patient status, including review of consultants reports, other relevant assessments, and collaboration with appropriate care team members and the patient's provider was performed as part of comprehensive patient evaluation and provision of chronic care management services.    SDOH (Social Determinants of Health) assessments performed: No; risk for social isolation; risk for tobacco use; risk for depression; risk for stress; risk for physical inactivity    Chronic Care Management from 10/21/2019 in [place]  PHQ-9 Total Score 7       GAD 7 : Generalized Anxiety Score 10/21/2019  Nervous, Anxious, on Edge 0  Control/stop worrying 0  Worry too much - different things 0  Trouble relaxing 0  Restless 0  Easily annoyed or irritable 0  Afraid - awful might happen 0  Total GAD 7 Score 0  Anxiety Difficulty Somewhat difficult    Outpatient Encounter Medications as of 03/24/2020  Medication Sig  . Accu-Chek Softclix Lancets lancets Test BS BID and prn Dx E11.21  . Adalimumab (HUMIRA PEN) 40 MG/0.4ML PNKT Inject 40 mg into the skin every 14 (fourteen) days. Can restart 06/04/19  . alendronate (FOSAMAX) 70 MG tablet TAKE 1 TABLET ONCE A WEEK. TAKE WITH A FULL GLASS OF WATER ON AN EMPTY STOMACH  . aspirin EC 81 MG tablet Take 81 mg by mouth daily.  Marland Kitchen atorvastatin (LIPITOR) 10 MG tablet Take 1 tablet (10 mg total) by mouth daily.  . Blood Glucose Monitoring Suppl (ACCU-CHEK AVIVA PLUS) w/Device KIT Test BS BID and prn Dx E11.21  . carvedilol (COREG) 12.5 MG tablet Take 1 tablet (12.5 mg total) by mouth 2 (two) times daily with a meal.  . enalapril (VASOTEC) 20 MG  tablet Take 2 tablets (40 mg total) by mouth daily.  . folic acid (FOLVITE) 1 MG tablet Take 1 tablet (1 mg total) by mouth daily.  Marland Kitchen gabapentin (NEURONTIN) 400 MG capsule Take 1 capsule (400 mg total) by mouth at bedtime.  Marland Kitchen glucose blood (ACCU-CHEK AVIVA PLUS) test strip Test BS BID and prn Dx E11.21  . insulin glargine (LANTUS SOLOSTAR) 100 UNIT/ML Solostar Pen Inject 5-10 Units into the skin at bedtime.  Marland Kitchen leflunomide (ARAVA) 20 MG tablet Take 1 tablet (20 mg total) by mouth daily. Can restart 06/04/19  . OPTIMAL-D 1.25 MG (50000 UT) capsule TAKE 1 CAPSULE BY MOUTH ONCE A WEEK FOR 8 DOSES  . pantoprazole (PROTONIX) 40 MG tablet Take 1 tablet (40 mg total) by mouth daily.   No facility-administered encounter medications on file as of 03/24/2020.    GOAL:  .  Client will talk with LCSW in next 30 days to discuss ADLs completion of client (pt-stated)      CARE PLAN ENTRY   Current Barriers:   Transport challenges Ambulation challenges (uses a cane to walk) in patient with Chronic Diagnoses of Ataxia, Type 2 DM, HTN, Mixed Hyperlipidemia   Clinical Social Work Clinical Goal(s):  Marland Kitchen LCSW will call client in next 30 days to talk with client /daughter about ADLs completion of client  Interventions:  Talked with client about CCM program Talked with client about RNCM support with CCM program Talked with client  about client appetite Talked with client about client completion of ADLs Talked with client about pain issues of client Talked with client about relaxation techniques of client (client likes to watch TV, does word games, crossword puzzles, enjoys shopping when she is able, walks outside with assistance) Talked with client about transport assist services for client ([employer] and [employer]) Talked with client about client decreased energy Talked with client about her upcoming appointment with kidney doctor on March 31, 2020 at 3:00 PM in [place], [place] Talked with client about family  support assistance Talked with client about foot care (she has seen Dr. Irving Shows in the past for foot care) Talked with [name] about medication procurement Talked with client about ambulation needs of client (she uses a cane to help her walk) Collaborated with RNCM to discuss nursing needs of client  Patient Self Care Activities:   Eats meals with set up assistance Does some ADLs indedpendently  Patient Self Care Deficits . Transport challenges . Mobility challenges  Initial goal documentation     Follow Up Plan: LCSW to call client or daughter of client in next 4 weeks to talk about client completion of daily ADLs  [name].Mekaylah Klich MSW, LCSW Licensed Clinical Social Worker [facility] [phone]

## 2020-03-24 NOTE — Patient Instructions (Addendum)
Licensed Clinical Education officer, museum Visit Information  Goals we discussed today:   Client will talk with LCSW in next 30 days to discuss ADLs completion of client (pt-stated)         CARE PLAN ENTRY   Current Barriers:   Transport challenges Ambulation challenges (uses a cane to walk) in patient with Chronic Diagnoses of Ataxia, Type 2 DM, HTN, Mixed Hyperlipidemia   Clinical Social Work Clinical Goal(s):   LCSW will call client in next 30 days to talk with client /daughter about ADLs completion of client  Interventions:  Talked with client about CCM program Talked with client about RNCM support with CCM program Talked with client about client appetite Talked with client about client completion of ADLs Talked with client about pain issues of client Talked with client about relaxation techniques of client (client likes to watch TV, does word games, crossword puzzles, enjoys shopping when she is able, walks outside with assistance) Talked with client about transport assist services for client ([employer] and [employer]) Talked with client about client decreased energy Talked with client about her upcoming appointment with kidney doctor on March 31, 2020 at 3:00 PM in [place], [place] Talked with client about family support assistance Talked with client about foot care (she has seen Dr. Irving Shows in the past for foot care) Talked with [name] about medication procurement Talked with client about ambulation needs of client (she uses a cane to help her walk) Collaborated with RNCM to discuss nursing needs of client  Patient Self Care Activities:   Eats meals with set up assistance Does some ADLs indedpendently  Patient Self Care Deficits  Transport challenges  Mobility challenges  Initial goal documentation     Follow Up Plan:LCSW to call client or daughter of client in next 4 weeks to talk about client completion of daily ADLs  Materials Provided: No  The patient  verbalized understanding of instructions provided today and declined a print copy of patient instruction materials.   Erica Crosby.Erica Crosby MSW, LCSW Licensed Clinical Social Worker [facility] [phone]

## 2020-03-24 NOTE — Telephone Encounter (Signed)
03/24/2020  Please reach out to patient again to schedule appt with Dr Lajuana Ripple. Patient did not schedule previously because of transportation concerns. Per Theadore Nan, patient will arrange transportation with someone if her daughter is not available to bring her.   Forwarding to [facility] clinical staff for appt scheduling.   Chong Sicilian, BSN, RN-BC Embedded Chronic Care Manager [facility] / [place] [facility] Direct Dial: [phone]

## 2020-03-31 ENCOUNTER — Telehealth: Payer: Medicare Other | Admitting: *Deleted

## 2020-03-31 ENCOUNTER — Telehealth: Payer: Self-pay | Admitting: *Deleted

## 2020-03-31 DIAGNOSIS — Z23 Encounter for immunization: Secondary | ICD-10-CM | POA: Diagnosis not present

## 2020-03-31 DIAGNOSIS — D508 Other iron deficiency anemias: Secondary | ICD-10-CM | POA: Diagnosis not present

## 2020-03-31 DIAGNOSIS — E611 Iron deficiency: Secondary | ICD-10-CM | POA: Diagnosis not present

## 2020-03-31 DIAGNOSIS — N179 Acute kidney failure, unspecified: Secondary | ICD-10-CM | POA: Diagnosis not present

## 2020-03-31 DIAGNOSIS — E1122 Type 2 diabetes mellitus with diabetic chronic kidney disease: Secondary | ICD-10-CM | POA: Diagnosis not present

## 2020-03-31 DIAGNOSIS — N183 Chronic kidney disease, stage 3 unspecified: Secondary | ICD-10-CM | POA: Diagnosis not present

## 2020-03-31 DIAGNOSIS — I129 Hypertensive chronic kidney disease with stage 1 through stage 4 chronic kidney disease, or unspecified chronic kidney disease: Secondary | ICD-10-CM | POA: Diagnosis not present

## 2020-03-31 NOTE — Telephone Encounter (Signed)
  Chronic Care Management   Outreach Note  03/31/2020 Name: Erica Crosby MRN: [id number] DOB: [birth year]  Referred by: [name], DO Reason for referral : Chronic Care Management   An unsuccessful telephone follow-up was attempted today. The patient was referred to the case management team for assistance with care management and care coordination.   Follow Up Plan: A HIPAA compliant phone message was left for the patient providing contact information and requesting a return call.  The care management team will reach out to the patient again over the next 30 days.   Chong Sicilian, BSN, RN-BC Embedded Chronic Care Manager [facility] / [place] [facility] Direct Dial: [phone]

## 2020-04-07 NOTE — Telephone Encounter (Signed)
Pt has been r/s  

## 2020-04-09 ENCOUNTER — Other Ambulatory Visit: Payer: Self-pay | Admitting: Family Medicine

## 2020-04-28 ENCOUNTER — Ambulatory Visit: Payer: Medicare Other | Admitting: Licensed Clinical Social Worker

## 2020-04-28 DIAGNOSIS — M79676 Pain in unspecified toe(s): Secondary | ICD-10-CM | POA: Diagnosis not present

## 2020-04-28 DIAGNOSIS — E782 Mixed hyperlipidemia: Secondary | ICD-10-CM

## 2020-04-28 DIAGNOSIS — E1142 Type 2 diabetes mellitus with diabetic polyneuropathy: Secondary | ICD-10-CM | POA: Diagnosis not present

## 2020-04-28 DIAGNOSIS — L84 Corns and callosities: Secondary | ICD-10-CM | POA: Diagnosis not present

## 2020-04-28 DIAGNOSIS — E1169 Type 2 diabetes mellitus with other specified complication: Secondary | ICD-10-CM

## 2020-04-28 DIAGNOSIS — B351 Tinea unguium: Secondary | ICD-10-CM | POA: Diagnosis not present

## 2020-04-28 DIAGNOSIS — E1122 Type 2 diabetes mellitus with diabetic chronic kidney disease: Secondary | ICD-10-CM

## 2020-04-28 DIAGNOSIS — R270 Ataxia, unspecified: Secondary | ICD-10-CM

## 2020-04-28 NOTE — Chronic Care Management (AMB) (Signed)
Chronic Care Management    Clinical Social Work Follow Up Note  04/28/2020 Name: Erica Crosby MRN: [id number] DOB: 10/18/1945  Erica Crosby is a 74 y.o. year old female who is a primary care patient of [name], DO. The CCM team was consulted for assistance with [name] .   Review of patient status, including review of consultants reports, other relevant assessments, and collaboration with appropriate care team members and the patient's provider was performed as part of comprehensive patient evaluation and provision of chronic care management services.    SDOH (Social Determinants of Health) assessments performed: No; risk for depression; risk for social isolation; risk for tobacco use;risk for stress; risk for physical inactivity; risk for transport needs    Chronic Care Management from 10/21/2019 in [place]  PHQ-9 Total Score 7       GAD 7 : Generalized Anxiety Score 10/21/2019  Nervous, Anxious, on Edge 0  Control/stop worrying 0  Worry too much - different things 0  Trouble relaxing 0  Restless 0  Easily annoyed or irritable 0  Afraid - awful might happen 0  Total GAD 7 Score 0  Anxiety Difficulty Somewhat difficult    Outpatient Encounter Medications as of 04/28/2020  Medication Sig  . Accu-Chek Softclix Lancets lancets Test BS BID and prn Dx E11.21  . Adalimumab (HUMIRA PEN) 40 MG/0.4ML PNKT Inject 40 mg into the skin every 14 (fourteen) days. Can restart 06/04/19  . alendronate (FOSAMAX) 70 MG tablet TAKE 1 TABLET ONCE A WEEK. TAKE WITH A FULL GLASS OF WATER ON AN EMPTY STOMACH  . aspirin EC 81 MG tablet Take 81 mg by mouth daily.  Marland Kitchen atorvastatin (LIPITOR) 10 MG tablet Take 1 tablet (10 mg total) by mouth daily.  . Blood Glucose Monitoring Suppl (ACCU-CHEK AVIVA PLUS) w/Device KIT Test BS BID and prn Dx E11.21  . carvedilol (COREG) 12.5 MG tablet Take 1 tablet (12.5 mg total) by mouth 2 (two) times daily with a meal.  .  enalapril (VASOTEC) 20 MG tablet Take 2 tablets (40 mg total) by mouth daily.  . folic acid (FOLVITE) 1 MG tablet Take 1 tablet (1 mg total) by mouth daily.  Marland Kitchen gabapentin (NEURONTIN) 400 MG capsule Take 1 capsule (400 mg total) by mouth at bedtime.  Marland Kitchen glucose blood (ACCU-CHEK AVIVA PLUS) test strip Test BS BID and prn Dx E11.21  . insulin glargine (LANTUS SOLOSTAR) 100 UNIT/ML Solostar Pen Inject 5-10 Units into the skin at bedtime.  Marland Kitchen leflunomide (ARAVA) 20 MG tablet Take 1 tablet (20 mg total) by mouth daily. Can restart 06/04/19  . OPTIMAL-D 1.25 MG (50000 UT) capsule TAKE 1 CAPSULE BY MOUTH ONCE A WEEK FOR 8 DOSES  . pantoprazole (PROTONIX) 40 MG tablet Take 1 tablet (40 mg total) by mouth daily.   No facility-administered encounter medications on file as of 04/28/2020.    Goals    .  Client will talk with LCSW in next 30 days to discuss ADLs completion of client (pt-stated)      CARE PLAN ENTRY   Current Barriers:   Transport challenges Ambulation challenges (uses a cane to walk) in patient with Chronic Diagnoses of Ataxia, Type 2 DM, HTN, Mixed Hyperlipidemia   Clinical Social Work Clinical Goal(s):  Marland Kitchen LCSW will call client in next 30 days to talk with client /daughter about ADLs completion of client  Interventions:  Talked with client about CCM program Talked with client about RNCM support with  CCM program Talked with client  about client appetite Talked with client about client completion of ADLs Talked with client about client upcoming appointments Talked with client about hearing issues of client Talked with [place] about [employer] transport support Talked with client about pain issues of client Talked with [name] about DME equipment use of client (has shower chair) Talked with [name] about relaxation techniques of client (client likes to watch TV, does word games, crossword puzzles, enjoys shopping when she is able, walks outside with assistance) Talked with client  about client  decreased energy Talked with client about sleeping issues of client Talked with Fatou about support for client with Dr. Lottie Dawson, pharmacist Talked with [name] about her podiatry appointment today (she said she is having some foot pain) Talked with Sidda about vision issues of client (she uses eye drops for her eyes)  Patient Self Care Activities:   Eats meals with set up assistance Does some ADLs indedpendently  Patient Self Care Deficits . Transport challenges . Mobility challenges  Initial goal documentation     Follow Up Plan:LCSW to call client or daughter of client in next 4 weeks to talk about client completion of daily ADLs  [name].Joshual Terrio MSW, LCSW Licensed Clinical Social Worker [facility] [phone]

## 2020-04-28 NOTE — Patient Instructions (Addendum)
Licensed Clinical Social Worker Visit Information  Goals we discussed today:   .  Client will talk with LCSW in next 30 days to discuss ADLs completion of client (pt-stated)       CARE PLAN ENTRY   Current Barriers:   Transport challenges Ambulation challenges (uses a cane to walk) in patient with Chronic Diagnoses of Ataxia, Type 2 DM, HTN, Mixed Hyperlipidemia   Clinical Social Work Clinical Goal(s):   LCSW will call client in next 30 days to talk with client /daughter about ADLs completion of client  Interventions:  Talked with client about CCM program Talked with client about RNCM support with CCM program Talked with client  about client appetite Talked with client about client completion of ADLs Talked with client about client upcoming appointments Talked with client about hearing issues of client Talked with [name] about [employer] transport support Talked with client about pain issues of client Talked with [name] about DME equipment use of client (has shower chair) Talked with [name] about relaxation techniques of client (client likes to watch TV, does word games, crossword puzzles, enjoys shopping when she is able, walks outside with assistance) Talked with client  about client decreased energy Talked with client about sleeping issues of client Talked with [name] about support for client with Dr. Lottie Dawson, pharmacist Talked with [name] about her podiatry appointment today (she said she is having some foot pain) Talked with [name] about vision issues of client (she uses eye drops for her eyes)  Patient Self Care Activities:   Eats meals with set up assistance Does some ADLs indedpendently  Patient Self Care Deficits  Transport challenges  Mobility challenges  Initial goal documentation     Follow Up Plan:LCSW to call client or daughter of client in next 4 weeks to talk about client completion of daily ADLs  Materials Provided: No  The patient verbalized  understanding of instructions provided today and declined a print copy of patient instruction materials.   [name].Demondre Aguas MSW, LCSW Licensed Clinical Social Worker [facility] [phone]

## 2020-04-29 ENCOUNTER — Ambulatory Visit: Payer: Medicare Other | Admitting: *Deleted

## 2020-04-29 DIAGNOSIS — Z7409 Other reduced mobility: Secondary | ICD-10-CM

## 2020-04-29 DIAGNOSIS — E1169 Type 2 diabetes mellitus with other specified complication: Secondary | ICD-10-CM

## 2020-04-29 DIAGNOSIS — IMO0002 Reserved for concepts with insufficient information to code with codable children: Secondary | ICD-10-CM

## 2020-04-29 DIAGNOSIS — E1122 Type 2 diabetes mellitus with diabetic chronic kidney disease: Secondary | ICD-10-CM

## 2020-04-29 NOTE — Chronic Care Management (AMB) (Signed)
Chronic Care Management   Follow Up Note   04/29/2020 Name: Erica Crosby MRN: [id number] DOB: August 28, 1945  Referred by: [name], DO Reason for referral : Chronic Care Management (RN follow up)   Erica Crosby is a 74 y.o. year old female who is a primary care patient of [name], DO. The CCM team was consulted for assistance with chronic disease management and care coordination needs.    Review of patient status, including review of consultants reports, relevant laboratory and other test results, and collaboration with appropriate care team members and the patient's provider was performed as part of comprehensive patient evaluation and provision of chronic care management services.    SDOH (Social Determinants of Health) assessments performed: Yes: transportation See Care Plan activities for detailed interventions related to [facility])     Outpatient Encounter Medications as of 04/29/2020  Medication Sig  . Accu-Chek Softclix Lancets lancets Test BS BID and prn Dx E11.21  . Adalimumab (HUMIRA PEN) 40 MG/0.4ML PNKT Inject 40 mg into the skin every 14 (fourteen) days. Can restart 06/04/19  . alendronate (FOSAMAX) 70 MG tablet TAKE 1 TABLET ONCE A WEEK. TAKE WITH A FULL GLASS OF WATER ON AN EMPTY STOMACH  . aspirin EC 81 MG tablet Take 81 mg by mouth daily.  Marland Kitchen atorvastatin (LIPITOR) 10 MG tablet Take 1 tablet (10 mg total) by mouth daily.  . Blood Glucose Monitoring Suppl (ACCU-CHEK AVIVA PLUS) w/Device KIT Test BS BID and prn Dx E11.21  . carvedilol (COREG) 12.5 MG tablet Take 1 tablet (12.5 mg total) by mouth 2 (two) times daily with a meal.  . enalapril (VASOTEC) 20 MG tablet Take 2 tablets (40 mg total) by mouth daily.  . folic acid (FOLVITE) 1 MG tablet Take 1 tablet (1 mg total) by mouth daily.  Marland Kitchen gabapentin (NEURONTIN) 400 MG capsule Take 1 capsule (400 mg total) by mouth at bedtime.  Marland Kitchen glucose blood (ACCU-CHEK AVIVA PLUS) test strip Test BS BID and prn Dx E11.21    . insulin glargine (LANTUS SOLOSTAR) 100 UNIT/ML Solostar Pen Inject 5-10 Units into the skin at bedtime.  Marland Kitchen leflunomide (ARAVA) 20 MG tablet Take 1 tablet (20 mg total) by mouth daily. Can restart 06/04/19  . OPTIMAL-D 1.25 MG (50000 UT) capsule TAKE 1 CAPSULE BY MOUTH ONCE A WEEK FOR 8 DOSES  . pantoprazole (PROTONIX) 40 MG tablet Take 1 tablet (40 mg total) by mouth daily.   No facility-administered encounter medications on file as of 04/29/2020.    Lab Results  Component Value Date   HGBA1C 6.1 12/09/2019   HGBA1C 8.3 (H) 03/11/2019   HGBA1C 11.5 (H) 07/07/2018   Lab Results  Component Value Date   LDLCALC 71 03/11/2019   CREATININE 1.74 (H) 12/09/2019   BP Readings from Last 3 Encounters:  12/09/19 (!) 148/67  10/16/19 115/78  10/09/19 (!) 167/69     Goals Addressed              This Visit's Progress     Patient Stated   .  COMPLETED: "I'm having problems with my stomach" (pt-stated)        CARE PLAN ENTRY (see longitudinal plan of care for additional care plan information)  Current Barriers:  . Care Coordination needs related to GI upset in a patient with diabetes and hypertension  Nurse Case Manager Clinical Goal(s):  Marland Kitchen Over the next 14 days, patient will work with PCP regarding abdominal discomfort and potential constipation  Interventions:  .  Inter-disciplinary care team collaboration (see longitudinal plan of care) . Chart reviewed including recent office notes and lab results . Referrals reviewed o Referred to GI for colonoscopy in 08/2016 but it doesn't appear that she had that done . Discussed Bowel movements o Reports BM every other day. Does not have to strain necessarily but does have to sit on the toilet for an extended period of time o Denies black tarry stools, weight loss, nausea or vomiting. o Positive for generalized abdominal pain, loss of appetite, and "bowels bubbling".  o Symptoms > 1 month . Discussed OTC treatments o Has taken pepto  bismol . Reviewed medications . Advised against pepto bismol. Encouraged increasing water and fiber intake. Advised that fiber may increase gas and "bubbling". Recommended simethicone. Hold off on any other OTC meds until talks with provider.  . Recommended visit with PCP or GI . Collaborated with [facility] clinical staff to schedule patient an appointment and forwarded telephone encounter to PCP as an FYI . Encouraged patient to reach out to PCP with any new or worsening symptoms or seek emergency medical care if necessary . Provided with RNCM contact number and encouraged to reach out as needed   Patient Self Care Activities:  . Performs ADL's independently . Performs IADL's independently  Initial goal documentation       Other   .  chronic Disease Management Needs        CARE PLAN ENTRY (see longtitudinal plan of care for additional care plan information)  Current Barriers:  . 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 Work with the care management team to address educational, disease management, and care coordination needs  . 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 Evaluation of current treatment plans and patient's adherence to plan as established by provider . Assessed patient's education and care coordination needs . Provided disease specific education to patient  . Collaborated with appropriate clinical care team members regarding patient needs . Chart reviewed including recent office notes, telephone calls, and lab results . Reviewed medications . Discussed mobility and ability to perform ADLs . Discussed previous GI complaints o Taking Maalox and symptoms have improved . Discussed  family/social support o Daughter lives with her but works long hours o Niece has offered to come from [place] to take her to get her Covid booster . Discussed transportation problems o Daughter works 12 hour shifts and is not able to take her to all appts o Discussed RCATS o Advised patient to reach out to me or Theadore Nan, LCSW if she's having trouble arranging transportation for a particular appt . Discussed Covid Booster o She plans to get that soon. Had [place] previously o Advised that [employer] is giving [place] . Discussed diabetic foot care o Recent podiatry visit with Dr Irving Shows . Discussed upcoming appointments: Mon 05/02/20 with Dr Lajuana Ripple o Patient's daughter is trying to find her transportation . 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 Patient is unable to independently self-manage chronic health conditions  Please see past updates related to this goal by clicking on the "Past Updates" button in the selected  goal          Plan:   Telephone follow up appointment with care management team member scheduled for: 06/02/20 with Theadore Nan, LCSW and 07/07/20 with RN Care Manager Next PCP appointment scheduled for: 05/02/20 with Dr Lajuana Ripple   Chong Sicilian, BSN, RN-BC [place] [facility] [place] Management Direct Dial: [phone]

## 2020-04-29 NOTE — Patient Instructions (Signed)
Visit Information  Goals Addressed              This Visit's Progress     Patient Stated   .  COMPLETED: "I'm having problems with my stomach" (pt-stated)        CARE PLAN ENTRY (see longitudinal plan of care for additional care plan information)  Current Barriers:  . Care Coordination needs related to GI upset in a patient with diabetes and hypertension  Nurse Case Manager Clinical Goal(s):  Marland Kitchen Over the next 14 days, patient will work with PCP regarding abdominal discomfort and potential constipation  Interventions:  . Inter-disciplinary care team collaboration (see longitudinal plan of care) . Chart reviewed including recent office notes and lab results . Referrals reviewed o Referred to GI for colonoscopy in 08/2016 but it doesn't appear that she had that done . Discussed Bowel movements o Reports BM every other day. Does not have to strain necessarily but does have to sit on the toilet for an extended period of time o Denies black tarry stools, weight loss, nausea or vomiting. o Positive for generalized abdominal pain, loss of appetite, and "bowels bubbling".  o Symptoms > 1 month . Discussed OTC treatments o Has taken pepto bismol . Reviewed medications . Advised against pepto bismol. Encouraged increasing water and fiber intake. Advised that fiber may increase gas and "bubbling". Recommended simethicone. Hold off on any other OTC meds until talks with provider.  . Recommended visit with PCP or GI . Collaborated with [facility] clinical staff to schedule patient an appointment and forwarded telephone encounter to PCP as an FYI . Encouraged patient to reach out to PCP with any new or worsening symptoms or seek emergency medical care if necessary . Provided with RNCM contact number and encouraged to reach out as needed   Patient Self Care Activities:  . Performs ADL's independently . Performs IADL's independently  Initial goal documentation       Other   .  chronic Disease  Management Needs        CARE PLAN ENTRY (see longtitudinal plan of care for additional care plan information)  Current Barriers:  . 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 Work with the care management team to address educational, disease management, and care coordination needs  . 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 Evaluation of current treatment plans and patient's adherence to plan as established by provider . Assessed patient's education and care coordination needs . Provided disease specific education to patient  . Collaborated with appropriate clinical care team members regarding patient needs . Chart reviewed including recent office notes, telephone calls, and lab results . Reviewed medications . Discussed mobility and ability to perform ADLs . Discussed previous GI complaints o Taking Maalox and symptoms have improved . Discussed family/social support o Daughter lives with her but works long hours o Niece has offered to come from [place] to take her to get her Covid booster . Discussed transportation problems o Daughter works 12 hour shifts and is not able to take her to all appts o Discussed RCATS o Advised patient to reach out to me or Theadore Nan, LCSW if she's having trouble arranging transportation for a particular appt . Discussed  Covid Booster o She plans to get that soon. Had Maderna previously o Advised that [employer] is giving [place] . Discussed diabetic foot care o Recent podiatry visit with Dr Irving Shows . Discussed upcoming appointments: Mon 05/02/20 with Dr Lajuana Ripple o Patient's daughter is trying to find her transportation . 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 Patient is unable to independently self-manage chronic health conditions  Please see past updates related to this goal by clicking on the "Past Updates" button in the selected goal         The patient verbalized understanding of instructions, educational materials, and care plan provided today and declined offer to receive copy of patient instructions, educational materials, and care plan.   Follow-up Plan Telephone follow up appointment with care management team member scheduled for: 06/02/20 with Theadore Nan, LCSW and 07/07/20 with RN Care Manager Next PCP appointment scheduled for: 05/02/20 with Dr Lajuana Ripple   Chong Sicilian, BSN, RN-BC [place] [facility] [place] Management Direct Dial: [phone]

## 2020-05-02 ENCOUNTER — Ambulatory Visit: Payer: Medicare Other | Admitting: Family Medicine

## 2020-05-02 NOTE — Progress Notes (Deleted)
Subjective: BV:QXIHWT up CKD/DM2/ HTN2 PCP: [name], DO HPI:Erica Crosby is a 74 y.o. female presenting to clinic today for:  1. Type 2 Diabetes w/ CKD, HTN and HLD  Previously cared for by Dr Dorris Fetch.  Discontinued going to appts in 11/2017 for unknown reasons.  Patient was instructed to reduce her Lantus further to 5 units due to hypoglycemic risks.  She was continued on her enalapril, Lipitor, gabapentin and Coreg.  She is here for interval follow-up ***  Last eye exam: Dr. Marin Comment at [facility], needs Last foot exam: UTD Last A1c:  Lab Results  Component Value Date   HGBA1C 6.1 12/09/2019  Nephropathy screen indicated?: on ACE-I Last flu, zoster and/or pneumovax:  Immunization History  Administered Date(s) Administered  . Influenza, High Dose Seasonal PF 03/11/2018, 02/19/2019  . Influenza,inj,Quad PF,6+ Mos 03/18/2016, 03/20/2017  . Moderna SARS-COVID-2 Vaccination 07/24/2019, 08/21/2019  . Pneumococcal Conjugate-13 04/21/2016  . Pneumococcal Polysaccharide-23 08/14/2017, 01/03/2018  . Zoster Recombinat (Shingrix) 01/03/2018, 03/11/2018    ROS: Per HPI  No Known Allergies Past Medical History:  Diagnosis Date  . Anemia   . Arthritis   . Cataract   . Coronary artery disease    Mild plaque 2012  . Diabetes mellitus   . Essential hypertension 02/10/2012  . GERD (gastroesophageal reflux disease)   . Hyperlipidemia   . Hypertension   . Neuropathy   . Peptic ulcer   . Rheumatoid arthritis ([place])   . TIA (transient ischemic attack) 02/10/2012    Current Outpatient Medications:  .  Accu-Chek Softclix Lancets lancets, Test BS BID and prn Dx E11.21, Disp: 200 each, Rfl: 3 .  Adalimumab (HUMIRA PEN) 40 MG/0.4ML PNKT, Inject 40 mg into the skin every 14 (fourteen) days. Can restart 06/04/19, Disp: 2 each, Rfl:  .  alendronate (FOSAMAX) 70 MG tablet, TAKE 1 TABLET ONCE A WEEK. TAKE WITH A FULL GLASS OF WATER ON AN EMPTY STOMACH, Disp: 12 tablet, Rfl: 0 .  aspirin EC 81  MG tablet, Take 81 mg by mouth daily., Disp: , Rfl:  .  atorvastatin (LIPITOR) 10 MG tablet, Take 1 tablet (10 mg total) by mouth daily., Disp: 90 tablet, Rfl: 3 .  Blood Glucose Monitoring Suppl (ACCU-CHEK AVIVA PLUS) w/Device KIT, Test BS BID and prn Dx E11.21, Disp: 1 kit, Rfl: 0 .  carvedilol (COREG) 12.5 MG tablet, Take 1 tablet (12.5 mg total) by mouth 2 (two) times daily with a meal., Disp: 180 tablet, Rfl: 3 .  enalapril (VASOTEC) 20 MG tablet, Take 2 tablets (40 mg total) by mouth daily., Disp: 180 tablet, Rfl: 3 .  folic acid (FOLVITE) 1 MG tablet, Take 1 tablet (1 mg total) by mouth daily., Disp: 90 tablet, Rfl: 3 .  gabapentin (NEURONTIN) 400 MG capsule, Take 1 capsule (400 mg total) by mouth at bedtime., Disp: 90 capsule, Rfl: 1 .  glucose blood (ACCU-CHEK AVIVA PLUS) test strip, Test BS BID and prn Dx E11.21, Disp: 200 each, Rfl: 3 .  insulin glargine (LANTUS SOLOSTAR) 100 UNIT/ML Solostar Pen, Inject 5-10 Units into the skin at bedtime., Disp: 15 mL, Rfl: 2 .  leflunomide (ARAVA) 20 MG tablet, Take 1 tablet (20 mg total) by mouth daily. Can restart 06/04/19, Disp:  , Rfl:  .  OPTIMAL-D 1.25 MG (50000 UT) capsule, TAKE 1 CAPSULE BY MOUTH ONCE A WEEK FOR 8 DOSES, Disp: 8 capsule, Rfl: 0 .  pantoprazole (PROTONIX) 40 MG tablet, Take 1 tablet (40 mg total) by mouth daily.,  Disp: 90 tablet, Rfl: 0 Social History   Socioeconomic History  . Marital status: Widowed    Spouse name: Not on file  . Number of children: 3  . Years of education: 63  . Highest education level: High school graduate  Occupational History  . Occupation: retired  Tobacco Use  . Smoking status: Former Smoker    Years: 25.00    Types: Cigarettes, E-cigarettes    Quit date: 02/02/2013    Years since quitting: 7.2  . Smokeless tobacco: Never Used  Vaping Use  . Vaping Use: Never used  Substance and Sexual Activity  . Alcohol use: No  . Drug use: No  . Sexual activity: Not Currently    Birth  control/protection: Post-menopausal  Other Topics Concern  . Not on file  Social History Narrative   Daughter stays with her.    Social Determinants of Health   Financial Resource Strain: Low Risk   . Difficulty of Paying Living Expenses: Not very hard  Food Insecurity: No Food Insecurity  . Worried About Programme researcher, broadcasting/film/video in the Last Year: Never true  . Ran Out of Food in the Last Year: Never true  Transportation Needs: No Transportation Needs  . Lack of Transportation (Medical): No  . Lack of Transportation (Non-Medical): No  Physical Activity: Inactive  . Days of Exercise per Week: 0 days  . Minutes of Exercise per Session: 0 min  Stress: No Stress Concern Present  . Feeling of Stress : Only a little  Social Connections: Moderately Isolated  . Frequency of Communication with Friends and Family: Three times a week  . Frequency of Social Gatherings with Friends and Family: Twice a week  . Attends Religious Services: More than 4 times per year  . Active Member of Clubs or Organizations: No  . Attends Banker Meetings: Never  . Marital Status: Widowed  Intimate Partner Violence:   . Fear of Current or Ex-Partner: Not on file  . Emotionally Abused: Not on file  . Physically Abused: Not on file  . Sexually Abused: Not on file   Family History  Problem Relation Age of Onset  . Heart disease Sister        Congeital (died age 91) with "enlarged heart"  . CAD Sister   . Diabetes Mother   . Heart disease Mother        Later onset heart disease   . Dementia Mother   . Alcohol abuse Father   . Liver disease Father   . CAD Sister 71       CABG  . Early death Sister   . Diabetes Brother   . Kidney disease Brother        related to DM  . Diabetes Brother     Objective: Office vital signs reviewed. There were no vitals taken for this visit.  Physical Examination:  General: Awake, alert, elderly, chronically ill-appearing female.  No acute distress HEENT:  Normal, sclera white Cardio: regular rate and rhythm, S1S2 heard, no murmurs appreciated Pulm: clear to auscultation bilaterally, no wheezes, rhonchi or rales; normal work of breathing on room air Extremities: warm, well perfused, No edema, cyanosis or clubbing; +2 pulses bilaterally MSK: uses cane for ambulation; tone is fair *** Assessment/ Plan: 74 y.o. female   ***  No orders of the defined types were placed in this encounter.  No orders of the defined types were placed in this encounter.    Raliegh Ip, DO [place]  [place] [phone]

## 2020-05-11 ENCOUNTER — Other Ambulatory Visit: Payer: Self-pay | Admitting: Family Medicine

## 2020-05-23 ENCOUNTER — Ambulatory Visit: Payer: Self-pay | Admitting: Licensed Clinical Social Worker

## 2020-05-23 DIAGNOSIS — E785 Hyperlipidemia, unspecified: Secondary | ICD-10-CM

## 2020-05-23 DIAGNOSIS — E1122 Type 2 diabetes mellitus with diabetic chronic kidney disease: Secondary | ICD-10-CM

## 2020-05-23 DIAGNOSIS — R270 Ataxia, unspecified: Secondary | ICD-10-CM

## 2020-05-23 DIAGNOSIS — IMO0002 Reserved for concepts with insufficient information to code with codable children: Secondary | ICD-10-CM

## 2020-05-23 DIAGNOSIS — E1169 Type 2 diabetes mellitus with other specified complication: Secondary | ICD-10-CM

## 2020-05-23 DIAGNOSIS — I129 Hypertensive chronic kidney disease with stage 1 through stage 4 chronic kidney disease, or unspecified chronic kidney disease: Secondary | ICD-10-CM

## 2020-05-23 DIAGNOSIS — E1165 Type 2 diabetes mellitus with hyperglycemia: Secondary | ICD-10-CM

## 2020-05-23 NOTE — Patient Instructions (Addendum)
Licensed Clinical Social Worker Visit Information  Goals we discussed today:  .  Client will talk with LCSW in next 30 days to discuss ADLs completion of client (pt-stated)        CARE PLAN ENTRY   Current Barriers:   Transport challenges Ambulation challenges (uses a cane to walk) in patient with Chronic Diagnoses of Ataxia, Type 2 DM, HTN, Mixed Hyperlipidemia   Clinical Social Work Clinical Goal(s):   LCSW will call client in next 30 days to talk with client /daughter about ADLs completion of client  Interventions:  Talked with client about transport needs of client Talked with client about upcoming client appointment at [facility] on [facility]2020 Talked with client about RCATS transport support Talked with client about scheduling RCATs transport assistance as needed for client Talked with client about client decreased energy Talked with client about in home care services (she said that a nurse is scheduled for December 14,2021 to make a home visit with client) Talked with client about ADLs completion Talked with client about DME of client (she said she has a tub seat) Talked with client about mobility of client Talked with client about her recent podiatry appointment  Patient Self Care Activities:   Eats meals with set up assistance Does some ADLs indedpendently  Patient Self Care Deficits  Transport challenges  Mobility challenges  Initial goal documentation     Follow Up Plan: LCSW to call client or daughter of client in next 4 weeks to talk about client completion of daily ADLs  Materials Provided: No  The patient verbalized understanding of instructions provided today and declined a print copy of patient instruction materials.   [name].Trishna Cwik MSW, LCSW Licensed Clinical Social Worker [facility] Care Management [phone]

## 2020-05-23 NOTE — Chronic Care Management (AMB) (Signed)
Chronic Care Management    Clinical Social Work Follow Up Note  05/23/2020 Name: Erica Crosby MRN: [id number] DOB: [birth year]  Erica Crosby is a 74 y.o. year old female who is a primary care patient of [name], DO. The CCM team was consulted for assistance with [name] .   Review of patient status, including review of consultants reports, other relevant assessments, and collaboration with appropriate care team members and the patient's provider was performed as part of comprehensive patient evaluation and provision of chronic care management services.    SDOH (Social Determinants of Health) assessments performed: No; risk for depression; risk for social isolation; risk for tobacco use; risk for stress;risk for physical inactivity    Chronic Care Management from 10/21/2019 in [place]  PHQ-9 Total Score 7       GAD 7 : Generalized Anxiety Score 10/21/2019  Nervous, Anxious, on Edge 0  Control/stop worrying 0  Worry too much - different things 0  Trouble relaxing 0  Restless 0  Easily annoyed or irritable 0  Afraid - awful might happen 0  Total GAD 7 Score 0  Anxiety Difficulty Somewhat difficult    Outpatient Encounter Medications as of 05/23/2020  Medication Sig  . Accu-Chek Softclix Lancets lancets Test BS BID and prn Dx E11.21  . Adalimumab (HUMIRA PEN) 40 MG/0.4ML PNKT Inject 40 mg into the skin every 14 (fourteen) days. Can restart 06/04/19  . alendronate (FOSAMAX) 70 MG tablet TAKE 1 TABLET ONCE A WEEK. TAKE WITH A FULL GLASS OF WATER ON AN EMPTY STOMACH  . aspirin EC 81 MG tablet Take 81 mg by mouth daily.  Marland Kitchen atorvastatin (LIPITOR) 10 MG tablet Take 1 tablet (10 mg total) by mouth daily.  . Blood Glucose Monitoring Suppl (ACCU-CHEK AVIVA PLUS) w/Device KIT Test BS BID and prn Dx E11.21  . carvedilol (COREG) 12.5 MG tablet Take 1 tablet (12.5 mg total) by mouth 2 (two) times daily with a meal.  . enalapril (VASOTEC) 20 MG tablet  Take 2 tablets (40 mg total) by mouth daily.  . folic acid (FOLVITE) 1 MG tablet Take 1 tablet (1 mg total) by mouth daily.  Marland Kitchen gabapentin (NEURONTIN) 400 MG capsule Take 1 capsule (400 mg total) by mouth at bedtime.  Marland Kitchen glucose blood (ACCU-CHEK AVIVA PLUS) test strip Test BS BID and prn Dx E11.21  . insulin glargine (LANTUS SOLOSTAR) 100 UNIT/ML Solostar Pen Inject 5-10 Units into the skin at bedtime.  Marland Kitchen leflunomide (ARAVA) 20 MG tablet Take 1 tablet (20 mg total) by mouth daily. Can restart 06/04/19  . OPTIMAL-D 1.25 MG (50000 UT) capsule TAKE 1 CAPSULE BY MOUTH ONCE A WEEK FOR 8 DOSES  . pantoprazole (PROTONIX) 40 MG tablet TAKE 1 TABLET BY MOUTH DAILY.   No facility-administered encounter medications on file as of 05/23/2020.    Goals    .  Client will talk with LCSW in next 30 days to discuss ADLs completion of client (pt-stated)      CARE PLAN ENTRY   Current Barriers:   Transport challenges Ambulation challenges (uses a cane to walk) in patient with Chronic Diagnoses of Ataxia, Type 2 DM, HTN, Mixed Hyperlipidemia   Clinical Social Work Clinical Goal(s):  Marland Kitchen LCSW will call client in next 30 days to talk with client /daughter about ADLs completion of client  Interventions:  Talked with client about transport needs of client Talked with client about upcoming client appointment at [facility] on 06/06/2020 Talked  with client about [facility] transport support Talked with client about scheduling [facility] transport assistance as needed for client Talked with client about client decreased energy Talked with client about in home care services (she said that a nurse is scheduled for December 14,2021 to make a home visit with client) Talked with client about ADLs completion Talked with client about DME of client (she said she has a tub seat) Talked with client about mobility of client Talked with client about her recent podiatry appointment  Patient Self Care Activities:   Eats meals with set up  assistance Does some ADLs indedpendently  Patient Self Care Deficits . Transport challenges . Mobility challenges  Initial goal documentation     Follow Up Plan: LCSW to call client or daughter of client in next 4 weeks to talk about client completion of daily ADLs  [name].Domenica Weightman MSW, LCSW Licensed Clinical Social Worker [facility] Care Management [phone]

## 2020-06-02 ENCOUNTER — Ambulatory Visit: Payer: Medicare Other | Admitting: Licensed Clinical Social Worker

## 2020-06-02 DIAGNOSIS — R270 Ataxia, unspecified: Secondary | ICD-10-CM

## 2020-06-02 DIAGNOSIS — E1122 Type 2 diabetes mellitus with diabetic chronic kidney disease: Secondary | ICD-10-CM

## 2020-06-02 DIAGNOSIS — IMO0002 Reserved for concepts with insufficient information to code with codable children: Secondary | ICD-10-CM

## 2020-06-02 NOTE — Chronic Care Management (AMB) (Signed)
Chronic Care Management    Clinical Social Work Follow Up Note  06/02/2020 Name: Erica Crosby MRN: [id number] DOB: 01/30/1946  Erica Crosby is a 74 y.o. year old female who is a primary care patient of [name], DO. The CCM team was consulted for assistance with [name] .   Review of patient status, including review of consultants reports, other relevant assessments, and collaboration with appropriate care team members and the patient's provider was performed as part of comprehensive patient evaluation and provision of chronic care management services.    SDOH (Social Determinants of Health) assessments performed: No; risk for depression; risk for social isolation; risk for tobacco use; risk for stress; risk for physical inactivity  Flowsheet Row Chronic Care Management from 10/21/2019 in [place]  PHQ-9 Total Score 7      GAD 7 : Generalized Anxiety Score 10/21/2019  Nervous, Anxious, on Edge 0  Control/stop worrying 0  Worry too much - different things 0  Trouble relaxing 0  Restless 0  Easily annoyed or irritable 0  Afraid - awful might happen 0  Total GAD 7 Score 0  Anxiety Difficulty Somewhat difficult    Outpatient Encounter Medications as of 06/02/2020  Medication Sig  . Accu-Chek Softclix Lancets lancets Test BS BID and prn Dx E11.21  . Adalimumab (HUMIRA PEN) 40 MG/0.4ML PNKT Inject 40 mg into the skin every 14 (fourteen) days. Can restart 06/04/19  . alendronate (FOSAMAX) 70 MG tablet TAKE 1 TABLET ONCE A WEEK. TAKE WITH A FULL GLASS OF WATER ON AN EMPTY STOMACH  . aspirin EC 81 MG tablet Take 81 mg by mouth daily.  Marland Kitchen atorvastatin (LIPITOR) 10 MG tablet Take 1 tablet (10 mg total) by mouth daily.  . Blood Glucose Monitoring Suppl (ACCU-CHEK AVIVA PLUS) w/Device KIT Test BS BID and prn Dx E11.21  . carvedilol (COREG) 12.5 MG tablet Take 1 tablet (12.5 mg total) by mouth 2 (two) times daily with a meal.  . enalapril  (VASOTEC) 20 MG tablet Take 2 tablets (40 mg total) by mouth daily.  . folic acid (FOLVITE) 1 MG tablet Take 1 tablet (1 mg total) by mouth daily.  Marland Kitchen gabapentin (NEURONTIN) 400 MG capsule Take 1 capsule (400 mg total) by mouth at bedtime.  Marland Kitchen glucose blood (ACCU-CHEK AVIVA PLUS) test strip Test BS BID and prn Dx E11.21  . insulin glargine (LANTUS SOLOSTAR) 100 UNIT/ML Solostar Pen Inject 5-10 Units into the skin at bedtime.  Marland Kitchen leflunomide (ARAVA) 20 MG tablet Take 1 tablet (20 mg total) by mouth daily. Can restart 06/04/19  . OPTIMAL-D 1.25 MG (50000 UT) capsule TAKE 1 CAPSULE BY MOUTH ONCE A WEEK FOR 8 DOSES  . pantoprazole (PROTONIX) 40 MG tablet TAKE 1 TABLET BY MOUTH DAILY.  . [DISCONTINUED] pantoprazole (PROTONIX) 40 MG tablet Take 1 tablet (40 mg total) by mouth daily.   No facility-administered encounter medications on file as of 06/02/2020.    Goals    .  Client will talk with LCSW in next 30 days to discuss ADLs completion of client (pt-stated)      CARE PLAN ENTRY   Current Barriers:   Transport challenges Ambulation challenges (uses a cane to walk) in patient with Chronic Diagnoses of Ataxia, Type 2 DM, HTN, Mixed Hyperlipidemia   Clinical Social Work Clinical Goal(s):  Marland Kitchen LCSW will call client in next 30 days to talk with client /daughter about ADLs completion of client  Interventions:  Talked with client  about her recent [place] booster shot Talked with client about transport needs of client Talked with client about upcoming client appointment at [facility] Talked with client about [facility] transport support Talked with client about scheduling [facility] transport assistance as needed for client Talked with client about client decreased energy Talked with client about ADLs completion Talked with client about DME of client (she said she has a tub seat) Talked with client about mobility of client (she uses a cane to help her walk) Talked with client about her recent podiatry appointment  with Dr. Irving Shows (she said she goes back to podiatrist in February of 2022) Talked with client about social support network Talked with client about medication procurement of client  Patient Self Care Activities:   Eats meals with set up assistance Does some ADLs indedpendently  Patient Self Care Deficits . Transport challenges . Mobility challenges  Initial goal documentation     Follow Up Plan: LCSW to call client or daughter of client in next 4 weeks to talk about client completion of daily ADLs  [name].Abraham Entwistle MSW, LCSW Licensed Clinical Social Worker [facility] Care Management [phone]

## 2020-06-02 NOTE — Patient Instructions (Addendum)
Licensed Clinical Social Worker Visit Information  Goals we discussed today:   .  Client will talk with LCSW in next 30 days to discuss ADLs completion of client (pt-stated)        CARE PLAN ENTRY   Current Barriers:   Transport challenges Ambulation challenges (uses a cane to walk) in patient with Chronic Diagnoses of Ataxia, Type 2 DM, HTN, Mixed Hyperlipidemia   Clinical Social Work Clinical Goal(s):   LCSW will call client in next 30 days to talk with client /daughter about ADLs completion of client  Interventions:  Talked with client about her recent [place] booster shot Talked with client about transport needs of client Talked with client about upcoming client appointment at [facility] Talked with client about [facility] transport support Talked with client about scheduling [facility] transport assistance as needed for client Talked withclientabout client decreased energy Talked with client about ADLs completion Talked with client about DME of client (she said she has a tub seat) Talked with client about mobility of client (she uses a cane to help her walk) Talked with client about her recent podiatry appointment with Dr. Irving Shows (she said she goes back to podiatrist in February of 2022) Talked with client about social support network Talked with client about medication procurement of client  Patient Self Care Activities:   Eats meals with set up assistance Does some ADLs indedpendently  Patient Self Care Deficits  Transport challenges  Mobility challenges  Initial goal documentation     Follow Up Plan: LCSW to call client or daughter of client in next 4 weeks to talk about client completion of daily ADLs  Materials Provided: No  The patient verbalized understanding of instructions provided today and declined a print copy of patient instruction materials.   [name].Clova Morlock MSW, LCSW Licensed Clinical Social Worker [facility]  Care Management [phone]

## 2020-06-06 ENCOUNTER — Other Ambulatory Visit: Payer: Self-pay | Admitting: Family Medicine

## 2020-06-06 ENCOUNTER — Ambulatory Visit: Payer: Medicare Other | Admitting: Family Medicine

## 2020-06-15 ENCOUNTER — Ambulatory Visit (INDEPENDENT_AMBULATORY_CARE_PROVIDER_SITE_OTHER): Payer: Medicare Other | Admitting: Family Medicine

## 2020-06-15 ENCOUNTER — Other Ambulatory Visit: Payer: Self-pay

## 2020-06-15 VITALS — BP 138/85 | HR 69 | Temp 97.4°F | Ht 67.0 in | Wt 126.0 lb

## 2020-06-15 DIAGNOSIS — I129 Hypertensive chronic kidney disease with stage 1 through stage 4 chronic kidney disease, or unspecified chronic kidney disease: Secondary | ICD-10-CM | POA: Diagnosis not present

## 2020-06-15 DIAGNOSIS — N183 Chronic kidney disease, stage 3 unspecified: Secondary | ICD-10-CM

## 2020-06-15 DIAGNOSIS — Z794 Long term (current) use of insulin: Secondary | ICD-10-CM | POA: Diagnosis not present

## 2020-06-15 DIAGNOSIS — E1169 Type 2 diabetes mellitus with other specified complication: Secondary | ICD-10-CM | POA: Diagnosis not present

## 2020-06-15 DIAGNOSIS — Z1159 Encounter for screening for other viral diseases: Secondary | ICD-10-CM

## 2020-06-15 DIAGNOSIS — E1122 Type 2 diabetes mellitus with diabetic chronic kidney disease: Secondary | ICD-10-CM | POA: Diagnosis not present

## 2020-06-15 DIAGNOSIS — E785 Hyperlipidemia, unspecified: Secondary | ICD-10-CM

## 2020-06-15 LAB — BAYER DCA HB A1C WAIVED: HB A1C (BAYER DCA - WAIVED): 5.9 % (ref <7.0)

## 2020-06-15 NOTE — Progress Notes (Signed)
Subjective: CC:DM2/ HTN2 PCP: [name], DO HPI:Erica Crosby is a 74 y.o. female presenting to clinic today for:  1. Type 2 Diabetes w/ CKD, HTN and HLD  Previously cared for by Dr Dorris Fetch.  Discontinued going to appts in 11/2017 for unknown reasons.  She takes Lipitor 10 and Enalapril 70m and Coreg 12.5.  At last visit her insulin was reduced to 5 units due to hypoglycemia. She was referred to podiatry.   She reports that she continues to take 14 to 20 units at bedtime of her Lantus.  She does report feeling lightheaded sometimes.  [facility]: [facility] Last A1c:  Lab Results  Component Value Date   HGBA1C 6.1 12/09/2019  Nephropathy screen indicated?: on ACE-I Last flu, zoster and/or pneumovax: declines   ROS: Per HPI  No Known Allergies Past Medical History:  Diagnosis Date   Anemia    Arthritis    Cataract    Coronary artery disease    Mild plaque 2012   Diabetes mellitus    Essential hypertension 02/10/2012   GERD (gastroesophageal reflux disease)    Hyperlipidemia    Hypertension    Neuropathy    Peptic ulcer    [place]oid [place]    TIA (transient ischemic attack) 02/10/2012    Current Outpatient Medications:    Accu-Chek Softclix Lancets lancets, CHECK BLOOD SUGAR TWICE A DAY AND AS NEEDED Dx E11.21, Disp: 200 each, Rfl: 3   Adalimumab (HUMIRA PEN) 40 MG/0.4ML PNKT, Inject 40 mg into the skin every 14 (fourteen) days. Can restart 06/04/19, Disp: 2 each, Rfl:    alendronate (FOSAMAX) 70 MG tablet, TAKE 1 TABLET ONCE A WEEK. TAKE WITH A FULL GLASS OF WATER ON AN EMPTY STOMACH, Disp: 12 tablet, Rfl: 0   aspirin EC 81 MG tablet, Take 81 mg by mouth daily., Disp: , Rfl:    atorvastatin (LIPITOR) 10 MG tablet, Take 1 tablet (10 mg total) by mouth daily., Disp: 90 tablet, Rfl: 3   Blood Glucose Monitoring Suppl (ACCU-CHEK AVIVA PLUS) w/Device KIT, Test BS BID and prn Dx E11.21, Disp: 1 kit,  Rfl: 0   carvedilol (COREG) 12.5 MG tablet, Take 1 tablet (12.5 mg total) by mouth 2 (two) times daily with a meal., Disp: 180 tablet, Rfl: 3   enalapril (VASOTEC) 20 MG tablet, Take 2 tablets (40 mg total) by mouth daily., Disp: 180 tablet, Rfl: 3   folic acid (FOLVITE) 1 MG tablet, Take 1 tablet (1 mg total) by mouth daily., Disp: 90 tablet, Rfl: 3   gabapentin (NEURONTIN) 400 MG capsule, Take 1 capsule (400 mg total) by mouth at bedtime., Disp: 90 capsule, Rfl: 1   glucose blood (ACCU-CHEK AVIVA PLUS) test strip, CHECK BLOOD SUGAR 2 TIMES A DAY AND AS NEEDED Dx E11.21, Disp: 200 strip, Rfl: 3   insulin glargine (LANTUS SOLOSTAR) 100 UNIT/ML Solostar Pen, Inject 5-10 Units into the skin at bedtime., Disp: 15 mL, Rfl: 2   leflunomide (ARAVA) 20 MG tablet, Take 1 tablet (20 mg total) by mouth daily. Can restart 06/04/19, Disp:  , Rfl:    OPTIMAL-D 1.25 MG (50000 UT) capsule, TAKE 1 CAPSULE BY MOUTH ONCE A WEEK FOR 8 DOSES, Disp: 8 capsule, Rfl: 0   pantoprazole (PROTONIX) 40 MG tablet, TAKE 1 TABLET BY MOUTH DAILY., Disp: 90 tablet, Rfl: 0 Social History   Socioeconomic History   Marital status: Widowed    Spouse name: Not on file  Number of children: 3   Years of education: 12   Highest education level: High school graduate  Occupational History   Occupation: retired  Tobacco Use   Smoking status: Former Smoker    Years: 25.00    Types: Cigarettes, E-cigarettes    Quit date: 02/02/2013    Years since quitting: 7.3   Smokeless tobacco: Never Used  Vaping Use   Vaping Use: Never used  Substance and Sexual Activity   Alcohol use: No   Drug use: No   Sexual activity: Not Currently    Birth control/protection: Post-menopausal  Other Topics Concern   Not on file  Social History Narrative   Daughter stays with her.    Social Determinants of Health   Financial Resource Strain: Low Risk    Difficulty of Paying Living Expenses: Not very hard  Food Insecurity:  No Food Insecurity   Worried About Charity fundraiser in the Last Year: Never true   Ran Out of Food in the Last Year: Never true  Transportation Needs: No Transportation Needs   Lack of Transportation (Medical): No   Lack of Transportation (Non-Medical): No  Physical Activity: Inactive   Days of Exercise per Week: 0 days   Minutes of Exercise per Session: 0 min  Stress: No Stress Concern Present   Feeling of Stress : Only a little  Social Connections: Moderately Isolated   Frequency of Communication with Friends and Family: Three times a week   Frequency of Social Gatherings with Friends and Family: Twice a week   Attends Religious Services: More than 4 times per year   Active Member of [employer] or Organizations: No   Attends Archivist Meetings: Never   Marital Status: Widowed  Human resources officer Violence: Not on file   Family History  Problem Relation Age of Onset   Heart disease Sister        Congeital (died age 55) with "enlarged heart"   CAD Sister    Diabetes Mother    Heart disease Mother        Later onset heart disease    Dementia Mother    Alcohol abuse Father    Liver disease Father    CAD Sister 35       CABG   Early death Sister    Diabetes Brother    Kidney disease Brother        related to DM   Diabetes Brother     Objective: Office vital signs reviewed. BP 138/85    Pulse 69    Temp (!) 97.4 F (36.3 C) (Temporal)    Ht '5\' 7"'  (1.702 m)    Wt 126 lb (57.2 kg)    SpO2 96%    BMI 19.73 kg/m   Physical Examination:  General: Awake, alert, elderly, chronically ill-appearing female.  No acute distress HEENT: Normal, sclera white Cardio: regular rate and rhythm, S1S2 heard, no murmurs appreciated Pulm: clear to auscultation bilaterally, no wheezes, rhonchi or rales; normal work of breathing on room air  Assessment/ Plan: 74 y.o. 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  I again reinforced need to reduce Lantus.  Honestly we may be of to discontinue it totally at this point.  I am going to coordinate with chronic care management again to reinforce need to reduce insulin.  I would like her to keep a strict log of her blood sugars.  She will schedule Dr. Truman Hayward for her eye exam.  Blood pressures controlled.  Enalapril discontinued by renal due to hypotension.  Agree with this.  Continue statin   No orders of the defined types were placed in this encounter.  No orders of the defined types were placed in this encounter.    [name], DO [place] [phone]

## 2020-06-15 NOTE — Patient Instructions (Signed)
Enalapril was discontinued by your kidney doctor because your blood pressure was too low.  Do not restart Enalapril without Dr Shelva Majestic recommendation  Get eye exam with Dr Marin Comment.

## 2020-06-16 LAB — RENAL FUNCTION PANEL
Albumin: 3.9 g/dL (ref 3.7–4.7)
BUN/Creatinine Ratio: 17 (ref 12–28)
BUN: 34 mg/dL — ABNORMAL HIGH (ref 8–27)
CO2: 23 mmol/L (ref 20–29)
Calcium: 9.1 mg/dL (ref 8.7–10.3)
Chloride: 102 mmol/L (ref 96–106)
Creatinine, Ser: 2.05 mg/dL — ABNORMAL HIGH (ref 0.57–1.00)
GFR calc Af Amer: 27 mL/min/{1.73_m2} — ABNORMAL LOW (ref >59)
GFR calc non Af Amer: 24 mL/min/{1.73_m2} — ABNORMAL LOW (ref >59)
Glucose: 114 mg/dL — ABNORMAL HIGH (ref 65–99)
Phosphorus: 3.5 mg/dL (ref 3.0–4.3)
Potassium: 4.7 mmol/L (ref 3.5–5.2)
Sodium: 139 mmol/L (ref 134–144)

## 2020-06-16 LAB — CBC WITH DIFFERENTIAL/PLATELET
Basophils Absolute: 0.1 10*3/uL (ref 0.0–0.2)
Basos: 1 %
EOS (ABSOLUTE): 0.2 10*3/uL (ref 0.0–0.4)
Eos: 2 %
Hematocrit: 32.6 % — ABNORMAL LOW (ref 34.0–46.6)
Hemoglobin: 10.2 g/dL — ABNORMAL LOW (ref 11.1–15.9)
Immature Grans (Abs): 0 10*3/uL (ref 0.0–0.1)
Immature Granulocytes: 0 %
Lymphocytes Absolute: 3.4 10*3/uL — ABNORMAL HIGH (ref 0.7–3.1)
Lymphs: 54 %
MCH: 27.9 pg (ref 26.6–33.0)
MCHC: 31.3 g/dL — ABNORMAL LOW (ref 31.5–35.7)
MCV: 89 fL (ref 79–97)
Monocytes Absolute: 0.5 10*3/uL (ref 0.1–0.9)
Monocytes: 8 %
Neutrophils Absolute: 2.3 10*3/uL (ref 1.4–7.0)
Neutrophils: 35 %
Platelets: 191 10*3/uL (ref 150–450)
RBC: 3.66 x10E6/uL — ABNORMAL LOW (ref 3.77–5.28)
RDW: 14 % (ref 11.7–15.4)
WBC: 6.5 10*3/uL (ref 3.4–10.8)

## 2020-06-16 LAB — HEPATITIS C ANTIBODY: Hep C Virus Ab: <0.1 s/co ratio (ref 0.0–0.9)

## 2020-06-23 ENCOUNTER — Ambulatory Visit: Payer: Medicare Other | Admitting: *Deleted

## 2020-06-23 ENCOUNTER — Telehealth: Payer: Self-pay | Admitting: *Deleted

## 2020-06-23 DIAGNOSIS — IMO0002 Reserved for concepts with insufficient information to code with codable children: Secondary | ICD-10-CM

## 2020-06-23 DIAGNOSIS — E1121 Type 2 diabetes mellitus with diabetic nephropathy: Secondary | ICD-10-CM

## 2020-06-23 DIAGNOSIS — I129 Hypertensive chronic kidney disease with stage 1 through stage 4 chronic kidney disease, or unspecified chronic kidney disease: Secondary | ICD-10-CM

## 2020-06-23 DIAGNOSIS — E1122 Type 2 diabetes mellitus with diabetic chronic kidney disease: Secondary | ICD-10-CM

## 2020-06-24 NOTE — Telephone Encounter (Addendum)
06/23/2020  Please call Ms Atha to schedule a time to come in and have the professional [employer] applied. She is inconsistent with checking her blood sugar and her A1C is 5.9. I think it would be helpful to see her blood sugar patterns so that Dr Lajuana Ripple can adjust or d/c her basal insulin if necessary. I explained the [name] to the patient and she is agreeable.   Forwarding to [facility] clinical staff for outreach and scheduling.    Chong Sicilian, BSN, RN-BC Embedded Chronic Care Manager [facility] / [place] [facility] Direct Dial: [phone]

## 2020-06-24 NOTE — Chronic Care Management (AMB) (Cosign Needed Addendum)
Chronic Care Management   Follow Up Note   06/23/2020 Name: Erica Crosby MRN: [id number] DOB: 04/23/1946  Referred by: Raliegh Ip, DO Reason for referral : Chronic Care Management (RN Follow up)   Erica Crosby is a 75 y.o. year old female who is a primary care patient of Raliegh Ip, DO. The CCM team was consulted for assistance with chronic disease management and care coordination needs.    Review of patient status, including review of consultants reports, relevant laboratory and other test results, and collaboration with appropriate care team members and the patient's provider was performed as part of comprehensive patient evaluation and provision of chronic care management services.    SDOH (Social Determinants of Health) assessments performed: No See Care Plan activities for detailed interventions related to [name])     Outpatient Encounter Medications as of 06/23/2020  Medication Sig  . Accu-Chek Softclix Lancets lancets CHECK BLOOD SUGAR TWICE A DAY AND AS NEEDED Dx E11.21  . Adalimumab (HUMIRA PEN) 40 MG/0.4ML PNKT Inject 40 mg into the skin every 14 (fourteen) days. Can restart 06/04/19  . alendronate (FOSAMAX) 70 MG tablet TAKE 1 TABLET ONCE A WEEK. TAKE WITH A FULL GLASS OF WATER ON AN EMPTY STOMACH  . aspirin EC 81 MG tablet Take 81 mg by mouth daily.  Marland Kitchen atorvastatin (LIPITOR) 10 MG tablet Take 1 tablet (10 mg total) by mouth daily.  . Blood Glucose Monitoring Suppl (ACCU-CHEK AVIVA PLUS) w/Device KIT Test BS BID and prn Dx E11.21  . carvedilol (COREG) 12.5 MG tablet Take 1 tablet (12.5 mg total) by mouth 2 (two) times daily with a meal.  . folic acid (FOLVITE) 1 MG tablet Take 1 tablet (1 mg total) by mouth daily.  Marland Kitchen gabapentin (NEURONTIN) 400 MG capsule Take 1 capsule (400 mg total) by mouth at bedtime.  Marland Kitchen glucose blood (ACCU-CHEK AVIVA PLUS) test strip CHECK BLOOD SUGAR 2 TIMES A DAY AND AS NEEDED Dx E11.21  . insulin glargine (LANTUS SOLOSTAR) 100 UNIT/ML  Solostar Pen Inject 5-10 Units into the skin at bedtime.  Marland Kitchen leflunomide (ARAVA) 20 MG tablet Take 1 tablet (20 mg total) by mouth daily. Can restart 06/04/19  . Omega-3 Fatty Acids (FISH OIL) 1000 MG CAPS   . OPTIMAL-D 1.25 MG (50000 UT) capsule TAKE 1 CAPSULE BY MOUTH ONCE A WEEK FOR 8 DOSES  . pantoprazole (PROTONIX) 40 MG tablet TAKE 1 TABLET BY MOUTH DAILY.   No facility-administered encounter medications on file as of 06/23/2020.     Objective:  Lab Results  Component Value Date   HGBA1C 5.9 06/15/2020   HGBA1C 6.1 12/09/2019   HGBA1C 8.3 (H) 03/11/2019   Lab Results  Component Value Date   LDLCALC 71 03/11/2019   CREATININE 2.05 (H) 06/15/2020      Patient Care Plan: RNCM: Diabetes    Problem Identified: Diabetes Management   Priority: High    Long-Range Goal: Monitor and Manage My Blood Sugar-Diabetes Type 2   Start Date: 06/23/2020  This Visit's Progress: Not on track  Priority: High  Note:   Current Barriers:  . Chronic Disease Management support and education needs related to diabetes in a patient with hypertension and CKD . Lacks caregiver support.  . Non-adherence to prescribed medication regimen . Unable to independently drive . Irregular eating pattern  Nurse Case Manager Clinical Goal(s):  Marland Kitchen Over the next 14 days, patient will work with [name] clinical staff to have [name] sensor placed to monitor blood  sugar continuously for 2 weeks . Over the next 14 days, patient will meet with RN Care Manager to address self-management of diabetes  Interventions:  . 1:1 collaboration with [name], DO regarding development and update of comprehensive plan of care as evidenced by provider attestation and co-signature . Inter-disciplinary care team collaboration (see longitudinal plan of care) . Evaluation of current treatment plan related to diabetes and patient's adherence to plan as established by provider. . Chart reviewed including relevant office  notes and lab results . Discussed recent A1C of 5.9 and PCP's concern that it is too low . Reviewed and discussed medications o Was taking lantus sliding scale 13-20 units qhs o Was decreased to 5 units at visit in November but patient continued to use old sliding scale o Advised patient that she should only be taking 5 units at bedtime . Advised patient to call PCP with any blood sugar readings below 70 or above 300 . Discussed professional [employer] for continuous glucose monitoring for 2 weeks. Explained how the sensor works and how the the data will show patterns in her blood sugar and will be helpful in adjusting her medication . Collaborated with [facility] clinical staff and requested that they contact patient to schedule a visit for sensor plaement . Patient has an irregular eating pattern o Dicussed diet and the importance of planning and eating meals regularly. At least 3 meals a day.  . Advised patient to check and record blood sugar fasting in the morning and before lantus in the evening and any other time that she feels it may be too high or to low o Patient reports that she is checking it twice a day but could only provide me with one reading of 154 fasting this morning . Discussed hypoglycemia and patient reports that she can tell when her blood sugar is too low . Encouraged patient to reach out to [facility] as needed  Patient Goals/Self-Care Activities Over the next 14 days, patient will: . Check blood sugar before breakfast and before taking Lantus in the evening . Only take 5 units of Lantus in the evening per Dr Lajuana Ripple . Call PCP at [phone] if blood sugar readings are below 70 or above 300 . Schedule a time to come into [facility] for [facility] sensor placement. This will continuously check your blood sugar for two weeks so that we can see the pattern . Call Dr Lajuana Ripple or [place] with any questions or concerns      Follow Up Plan:  . Telephone  follow up appointment with care management team member scheduled for: 07/04/2020 with RN Care Manager . Schedule a time to come in to [facility] to have Freestyle [place] sensor placed  Chong Sicilian, BSN, RN-BC [place] / [place] Management Direct Dial: [phone]

## 2020-06-24 NOTE — Telephone Encounter (Signed)
Dr. Darnell Level, can you prescribe what patient needs for Mickie Kay and let us know when this is done and I will call patient to schedule appointment with Almyra Free to have applied.

## 2020-06-24 NOTE — Telephone Encounter (Signed)
Scheduled with Lottie Dawson, PharmD for 06/30/20 for [facility] sensor application.

## 2020-06-24 NOTE — Patient Instructions (Signed)
Visit Information  Goals Addressed            This Visit's Progress   . Monitor and Manage My Blood Sugar-Diabetes Type 2       Timeframe:  Long-Range Goal Priority:  High Start Date:                             Expected End Date:                       Follow Up Date 07/04/2020   . Check blood sugar before breakfast and before taking Lantus in the evening . Only take 5 units of Lantus in the evening per Dr Lajuana Ripple . Call PCP at [phone] if blood sugar readings are below 70 or above 300 . Schedule a time to come into [facility] for [facility] [place] sensor placement. This will continuously check your blood sugar for two weeks so that we can see the pattern . Call Dr Lajuana Ripple or [place] with any questions or concerns   Why is this important?    Checking your blood sugar at home helps to keep it from getting very high or very low.   Writing the results in a diary or log helps the doctor know how to care for you.   Your blood sugar log should have the time, date and the results.   Also, write down the amount of insulin or other medicine that you take.   Other information, like what you ate, exercise done and how you were feeling, will also be helpful.     Notes:        The patient verbalized understanding of instructions, educational materials, and care plan provided today and declined offer to receive copy of patient instructions, educational materials, and care plan.    Follow Up Plan:  . Telephone follow up appointment with care management team member scheduled for: 07/04/2020 with RN Care Manager . Schedule a time to come in to [facility] to have Freestyle [place] sensor placed  Chong Sicilian, BSN, RN-BC [place] / [name] Management Direct Dial: [phone]

## 2020-06-30 ENCOUNTER — Ambulatory Visit: Payer: Medicare Other | Admitting: Pharmacist

## 2020-07-04 ENCOUNTER — Telehealth: Payer: Medicare Other

## 2020-07-04 ENCOUNTER — Other Ambulatory Visit: Payer: Self-pay | Admitting: Family Medicine

## 2020-07-05 ENCOUNTER — Ambulatory Visit: Payer: Medicare Other | Admitting: Pharmacist

## 2020-07-07 ENCOUNTER — Ambulatory Visit: Payer: Medicare Other | Admitting: *Deleted

## 2020-07-07 DIAGNOSIS — IMO0002 Reserved for concepts with insufficient information to code with codable children: Secondary | ICD-10-CM

## 2020-07-07 DIAGNOSIS — N183 Chronic kidney disease, stage 3 unspecified: Secondary | ICD-10-CM

## 2020-07-08 ENCOUNTER — Ambulatory Visit: Payer: Medicare Other | Admitting: Licensed Clinical Social Worker

## 2020-07-08 ENCOUNTER — Telehealth: Payer: Self-pay | Admitting: *Deleted

## 2020-07-08 DIAGNOSIS — E1122 Type 2 diabetes mellitus with diabetic chronic kidney disease: Secondary | ICD-10-CM

## 2020-07-08 DIAGNOSIS — IMO0002 Reserved for concepts with insufficient information to code with codable children: Secondary | ICD-10-CM

## 2020-07-08 DIAGNOSIS — E785 Hyperlipidemia, unspecified: Secondary | ICD-10-CM

## 2020-07-08 DIAGNOSIS — R270 Ataxia, unspecified: Secondary | ICD-10-CM

## 2020-07-08 NOTE — Patient Instructions (Addendum)
Licensed Clinical Education officer, museum Visit Information  Goals we discussed today:     Client will talk with LCSW in next 30 days to discuss ADLs completion of client (pt-stated)        CARE PLAN ENTRY   Current Barriers:   Transport challenges Ambulation challenges (uses a cane to walk) in patient with Chronic Diagnoses of Ataxia, Type 2 DM, HTN, Mixed Hyperlipidemia   Clinical Social Work Clinical Goal(s):   LCSW will call client in next 30 days to talk with client /daughter about ADLs completion of client  Interventions:  Talked with client about her current needs Talked with client about medication procurement Talked with client about appetite of client (she said she often eats breakfast, skips lunch, and has normal supper meal) Talked with client about challenges in managing Diabetes  Talked with client about pain issues of client  Talked with client about RNCM support with CCM program Talked with client about client completion of ADLs Talked with client about client upcoming appointments Talked with client about relaxation techniques of client (client likes to watch TV, does word games, crossword puzzles, enjoys shopping when she is able, walks outside with assistance) Talked with client about transport assist services for client ([employer] and [place]) Talked with client about client decreased energy Talked with client and her discussion recently with RNCM about CGM. Talked with client about sleeping issues of client Collaborated with RNCM about nursing needs of client   Patient Self Care Activities:   Eats meals with set up assistance Does some ADLs indedpendently  Patient Self Care Deficits  Transport challenges  Mobility challenges  Initial goal documentation     Follow Up Plan:LCSW to call client or daughter of client in next 4 weeks to talk about client completion of daily ADLs  Materials Provided: No  The patient verbalized  understanding of instructions provided today and declined a print copy of patient instruction materials.   Erica Crosby.Erica Crosby MSW, LCSW Licensed Clinical Social Worker [facility] Care Management [phone]

## 2020-07-08 NOTE — Chronic Care Management (AMB) (Signed)
Chronic Care Management    Clinical Social Work Follow Up Note  07/08/2020 Name: Erica Crosby MRN: [id number] DOB: 04/11/1946  Erica Crosby is a 75 y.o. year old female who is a primary care patient of [name], DO. The CCM team was consulted for assistance with [name] .   Review of patient status, including review of consultants reports, other relevant assessments, and collaboration with appropriate care team members and the patient's provider was performed as part of comprehensive patient evaluation and provision of chronic care management services.    SDOH (Social Determinants of Health) assessments performed: No; risk for social isolation; risk for tobacco use; risk for depression; risk for stress; risk for physical inactivity  [place] Office Visit from 06/15/2020 in [place]  PHQ-9 Total Score 11      GAD 7 : Generalized Anxiety Score 10/21/2019  Nervous, Anxious, on Edge 0  Control/stop worrying 0  Worry too much - different things 0  Trouble relaxing 0  Restless 0  Easily annoyed or irritable 0  Afraid - awful might happen 0  Total GAD 7 Score 0  Anxiety Difficulty Somewhat difficult    Outpatient Encounter Medications as of 07/08/2020  Medication Sig  . Accu-Chek Softclix Lancets lancets CHECK BLOOD SUGAR TWICE A DAY AND AS NEEDED Dx E11.21  . Adalimumab (HUMIRA PEN) 40 MG/0.4ML PNKT Inject 40 mg into the skin every 14 (fourteen) days. Can restart 06/04/19  . alendronate (FOSAMAX) 70 MG tablet TAKE 1 TABLET ONCE A WEEK. TAKE WITH A FULL GLASS OF WATER ON AN EMPTY STOMACH  . aspirin EC 81 MG tablet Take 81 mg by mouth daily.  Marland Kitchen atorvastatin (LIPITOR) 10 MG tablet Take 1 tablet (10 mg total) by mouth daily.  . Blood Glucose Monitoring Suppl (ACCU-CHEK AVIVA PLUS) w/Device KIT Test BS BID and prn Dx E11.21  . carvedilol (COREG) 12.5 MG tablet Take 1 tablet (12.5 mg total) by mouth 2 (two) times daily with a meal.  .  folic acid (FOLVITE) 1 MG tablet Take 1 tablet (1 mg total) by mouth daily.  Marland Kitchen gabapentin (NEURONTIN) 400 MG capsule Take 1 capsule (400 mg total) by mouth at bedtime.  Marland Kitchen glucose blood (ACCU-CHEK AVIVA PLUS) test strip CHECK BLOOD SUGAR 2 TIMES A DAY AND AS NEEDED Dx E11.21  . insulin glargine (LANTUS SOLOSTAR) 100 UNIT/ML Solostar Pen Inject 5-10 Units into the skin at bedtime.  Marland Kitchen leflunomide (ARAVA) 20 MG tablet Take 1 tablet (20 mg total) by mouth daily. Can restart 06/04/19  . Omega-3 Fatty Acids (FISH OIL) 1000 MG CAPS   . OPTIMAL-D 1.25 MG (50000 UT) capsule TAKE 1 CAPSULE BY MOUTH ONCE A WEEK FOR 8 DOSES  . pantoprazole (PROTONIX) 40 MG tablet TAKE 1 TABLET BY MOUTH DAILY.   No facility-administered encounter medications on file as of 07/08/2020.    Goals    .  Client will talk with LCSW in next 30 days to discuss ADLs completion of client (pt-stated)      CARE PLAN ENTRY   Current Barriers:   Transport challenges Ambulation challenges (uses a cane to walk) in patient with Chronic Diagnoses of Ataxia, Type 2 DM, HTN, Mixed Hyperlipidemia   Clinical Social Work Clinical Goal(s):  Marland Kitchen LCSW will call client in next 30 days to talk with client /daughter about ADLs completion of client  Interventions:  Talked with client about her current needs Talked with client about medication procurement Talked with client about  appetite of client (she said she often eats breakfast, skips lunch, and has normal supper meal) Talked with client about challenges in managing Diabetes  Talked with client about pain issues of client  Talked with client about RNCM support with CCM program Talked with client about client completion of ADLs Talked with client about client upcoming appointments Talked with client about relaxation techniques of client (client likes to watch TV, does word games, crossword puzzles, enjoys shopping when she is able, walks outside with assistance) Talked with client about  transport assist services for client ([employer] and [place]) Talked with client about client decreased energy Talked with client and her discussion recently with RNCM about CGM. Talked with client about sleeping issues of client Collaborated with RNCM about nursing needs of client   Patient Self Care Activities:   Eats meals with set up assistance Does some ADLs indedpendently  Patient Self Care Deficits . Transport challenges . Mobility challenges  Initial goal documentation     Follow Up Plan: LCSW to call client or daughter of client in next 4 weeks to talk about client completion of daily ADLs  [name].Kristine Chahal MSW, LCSW Licensed Clinical Social Worker [facility] Care Management [phone]

## 2020-07-08 NOTE — Telephone Encounter (Signed)
Left message to call back  

## 2020-07-08 NOTE — Chronic Care Management (AMB) (Signed)
Chronic Care Management   CCM RN Visit Note  07/07/2020 Name: Erica Crosby MRN: [id number] DOB: Jan 28, 1946  Subjective: Erica Crosby is a 75 y.o. year old female who is a primary care patient of [name], DO. The care management team was consulted for assistance with disease management and care coordination needs.    Engaged with patient by telephone for follow up visit in response to provider referral for case management and/or care coordination services.   Consent to Services:  The patient was given information about Chronic Care Management services, agreed to services, and gave verbal consent prior to initiation of services.  Please see initial visit note for detailed documentation.   Patient agreed to services and verbal consent obtained.   Assessment: Review of patient past medical history, allergies, medications, health status, including review of consultants reports, laboratory and other test data, was performed as part of comprehensive evaluation and provision of chronic care management services.   SDOH (Social Determinants of Health) assessments and interventions performed:    CCM Care Plan  No Known Allergies  Outpatient Encounter Medications as of 07/07/2020  Medication Sig  . Accu-Chek Softclix Lancets lancets CHECK BLOOD SUGAR TWICE A DAY AND AS NEEDED Dx E11.21  . Adalimumab (HUMIRA PEN) 40 MG/0.4ML PNKT Inject 40 mg into the skin every 14 (fourteen) days. Can restart 06/04/19  . alendronate (FOSAMAX) 70 MG tablet TAKE 1 TABLET ONCE A WEEK. TAKE WITH A FULL GLASS OF WATER ON AN EMPTY STOMACH  . aspirin EC 81 MG tablet Take 81 mg by mouth daily.  Marland Kitchen atorvastatin (LIPITOR) 10 MG tablet Take 1 tablet (10 mg total) by mouth daily.  . Blood Glucose Monitoring Suppl (ACCU-CHEK AVIVA PLUS) w/Device KIT Test BS BID and prn Dx E11.21  . carvedilol (COREG) 12.5 MG tablet Take 1 tablet (12.5 mg total) by mouth 2 (two) times daily with a meal.  . folic acid (FOLVITE) 1 MG  tablet Take 1 tablet (1 mg total) by mouth daily.  Marland Kitchen gabapentin (NEURONTIN) 400 MG capsule Take 1 capsule (400 mg total) by mouth at bedtime.  Marland Kitchen glucose blood (ACCU-CHEK AVIVA PLUS) test strip CHECK BLOOD SUGAR 2 TIMES A DAY AND AS NEEDED Dx E11.21  . insulin glargine (LANTUS SOLOSTAR) 100 UNIT/ML Solostar Pen Inject 5-10 Units into the skin at bedtime.  Marland Kitchen leflunomide (ARAVA) 20 MG tablet Take 1 tablet (20 mg total) by mouth daily. Can restart 06/04/19  . Omega-3 Fatty Acids (FISH OIL) 1000 MG CAPS   . OPTIMAL-D 1.25 MG (50000 UT) capsule TAKE 1 CAPSULE BY MOUTH ONCE A WEEK FOR 8 DOSES  . pantoprazole (PROTONIX) 40 MG tablet TAKE 1 TABLET BY MOUTH DAILY.   No facility-administered encounter medications on file as of 07/07/2020.    Patient Active Problem List   Diagnosis Date Noted  . Acute respiratory failure with hypoxia ([place]) 05/24/2019  . COVID-19 virus detected   . Acute metabolic encephalopathy 04/03/5101  . Hypokalemia 05/22/2019  . Hypomagnesemia 05/22/2019  . Elevated troponin 05/22/2019  . Pneumonia due to COVID-19 virus 05/21/2019  . Hyperlipidemia associated with type 2 diabetes mellitus ([place]) 07/07/2018  . Noncompliance with medication regimen 07/07/2018  . Palpitations 05/07/2018  . Chest pain 05/07/2018  . RA (rheumatoid arthritis) ([place]) 03/20/2017  . Mixed hyperlipidemia 02/13/2017  . Hypertension associated with stage 3 chronic kidney disease due to type 2 diabetes mellitus ([place]) 12/24/2016  . Personal history of noncompliance with medical treatment, presenting hazards to health 12/24/2016  .  Bulging lumbar disc 08/22/2016  . Syncope 02/10/2012  . Ataxia 02/10/2012  . Uncontrolled type 2 diabetes mellitus with diabetic nephropathy, with long-term current use of insulin ([place]) 02/10/2012    Conditions to be addressed/monitored:HTN, DMII and CKD  Care Plan : RNCM: Diabetes  Updates made by Ilean China, RN since 07/08/2020 12:00 AM    Problem: Diabetes Management    Priority: High    Long-Range Goal: Monitor and Manage My Blood Sugar-Diabetes Type 2   Start Date: 06/23/2020  Recent Progress: Not on track  Priority: High  Note:   Current Barriers:  . Chronic Disease Management support and education needs related to diabetes in a patient with hypertension and CKD . Lacks caregiver support.  . Non-adherence to prescribed medication regimen . Unable to independently drive . Irregular eating pattern  Nurse Case Manager Clinical Goal(s):  Marland Kitchen Over the next 14 days, patient will work with [facility] clinical staff to have [name] sensor placed to monitor blood sugar continuously for 2 weeks . Over the next 14 days, patient will meet with RN Care Manager to address self-management of diabetes  Interventions:  . 1:1 collaboration with [name], DO regarding development and update of comprehensive plan of care as evidenced by provider attestation and co-signature . Inter-disciplinary care team collaboration (see longitudinal plan of care) . Evaluation of current treatment plan related to diabetes and patient's adherence to plan as established by provider. . Chart reviewed including relevant office notes and lab results o Patient was scheduled with Lottie Dawson, PharmD for [facility] sensor application but was cancelled due to inclement weather.  Lyn Hollingshead recommended she schedule with triage for application . Reviewed and discussed medications o Reiterated that patient should only be taking 5 units of Lantus at bedtime. Pt confirmed that she is taking this dose. . Advised patient to call PCP with any blood sugar readings below 70 or above 300 (per PCP recommendation) . Discussed professional [employer] for continuous glucose monitoring for 2 weeks. Explained how the sensor works and how the the data will show patterns in her blood sugar and will be helpful in adjusting her medication . Collaborated with [facility] clinical staff and requested that  they contact patient to schedule a visit for sensor plaement . Patient has an irregular eating pattern o Dicussed diet and the importance of planning and eating meals regularly. At least 3 meals a day.  . Advised patient to check and record blood sugar fasting in the morning and before lantus in the evening and any other time that she feels it may be too high or to low o Patient reports that she is checking it twice a day  o Patient wasn't sure what her blood sugar reading was this morning but said it was in the "100s" . Discussed hypoglycemia and patient reports that she can tell when her blood sugar is too low . Encouraged patient to reach out to [facility] as needed  Patient Goals/Self-Care Activities Over the next 14 days, patient will: . Check blood sugar before breakfast and before taking Lantus in the evening . Only take 5 units of Lantus in the evening per Dr Lajuana Ripple . Call PCP at [phone] if blood sugar readings are below 70 or above 300 . Schedule a time to come into [facility] for [facility] sensor placement. This will continuously check your blood sugar for two weeks so that we can see the pattern . Call Dr Lajuana Ripple or [place] with  any questions or concerns       Plan:The patient has been provided with contact information for the care management team and has been advised to call with any health related questions or concerns. , The care management team will reach out to the patient again over the next 14 days. and [facility] clinical staff will contact patient to schedule [name] Sensor application  Chong Sicilian, BSN, RN-BC [place] / [place] Management Direct Dial: [phone]

## 2020-07-08 NOTE — Patient Instructions (Signed)
Visit Information   .  Monitor and Manage My Blood Sugar-Diabetes Type 2      Timeframe:  Long-Range Goal Priority:  High Start Date:                             Expected End Date:                       Follow Up Date 07/04/2020   . Check blood sugar before breakfast and before taking Lantus in the evening . Only take 5 units of Lantus in the evening per Dr Lajuana Ripple . Call PCP at [phone] if blood sugar readings are below 70 or above 300 . Schedule a time to come into [facility] for [facility] [place] sensor placement. This will continuously check your blood sugar for two weeks so that we can see the pattern . Call Dr Lajuana Ripple or [place] with any questions or concerns   Why is this important?    Checking your blood sugar at home helps to keep it from getting very high or very low.   Writing the results in a diary or log helps the doctor know how to care for you.   Your blood sugar log should have the time, date and the results.   Also, write down the amount of insulin or other medicine that you take.   Other information, like what you ate, exercise done and how you were feeling, will also be helpful.     Notes:        Patient verbalizes understanding of instructions provided today.  Plan:The patient has been provided with contact information for the care management team and has been advised to call with any health related questions or concerns. , The care management team will reach out to the patient again over the next 14 days. and [facility] clinical staff will contact patient to schedule [name] Sensor application  Chong Sicilian, BSN, RN-BC [place] / [place] Management Direct Dial: [phone]

## 2020-07-08 NOTE — Telephone Encounter (Signed)
07/08/2020  Patient needs to be scheduled for professional [facility] application. She was scheduled with Lottie Dawson, PharmD, but Almyra Free recommended that she reschedule with Triage.   Forwarding to [facility] Clinical staff for scheduling.  Chong Sicilian, BSN, RN-BC Embedded Chronic Care Manager [facility] / [place] Management Direct Dial: [phone]

## 2020-07-11 NOTE — Telephone Encounter (Signed)
Line busy

## 2020-07-15 ENCOUNTER — Telehealth: Payer: Medicare Other | Admitting: *Deleted

## 2020-07-22 ENCOUNTER — Telehealth: Payer: Self-pay | Admitting: *Deleted

## 2020-07-22 ENCOUNTER — Ambulatory Visit (INDEPENDENT_AMBULATORY_CARE_PROVIDER_SITE_OTHER): Payer: Medicare Other | Admitting: *Deleted

## 2020-07-22 DIAGNOSIS — E1122 Type 2 diabetes mellitus with diabetic chronic kidney disease: Secondary | ICD-10-CM

## 2020-07-22 DIAGNOSIS — E1121 Type 2 diabetes mellitus with diabetic nephropathy: Secondary | ICD-10-CM

## 2020-07-22 DIAGNOSIS — IMO0002 Reserved for concepts with insufficient information to code with codable children: Secondary | ICD-10-CM

## 2020-07-22 DIAGNOSIS — N183 Chronic kidney disease, stage 3 unspecified: Secondary | ICD-10-CM

## 2020-07-22 NOTE — Chronic Care Management (AMB) (Signed)
Chronic Care Management   CCM RN Visit Note  07/22/2020 Name: Erica Crosby MRN: [id number] DOB: 06-Jul-1945  Subjective: Erica Crosby is a 75 y.o. year old female who is a primary care patient of Erica Norlander, DO. The care management team was consulted for assistance with disease management and care coordination needs.    Engaged with patient by telephone for follow up visit in response to provider referral for case management and/or care coordination services.   Consent to Services:  The patient was given information about Chronic Care Management services, agreed to services, and gave verbal consent prior to initiation of services.  Please see initial visit note for detailed documentation.   Patient agreed to services and verbal consent obtained.   Assessment: Review of patient past medical history, allergies, medications, health status, including review of consultants reports, laboratory and other test data, was performed as part of comprehensive evaluation and provision of chronic care management services.   SDOH (Social Determinants of Health) assessments and interventions performed:  Yes  CCM Care Plan  No Known Allergies  Outpatient Encounter Medications as of 07/22/2020  Medication Sig  . Accu-Chek Softclix Lancets lancets CHECK BLOOD SUGAR TWICE A DAY AND AS NEEDED Dx E11.21  . Adalimumab (HUMIRA PEN) 40 MG/0.4ML PNKT Inject 40 mg into the skin every 14 (fourteen) days. Can restart 06/04/19  . alendronate (FOSAMAX) 70 MG tablet TAKE 1 TABLET ONCE A WEEK. TAKE WITH A FULL GLASS OF WATER ON AN EMPTY STOMACH  . aspirin EC 81 MG tablet Take 81 mg by mouth daily.  Marland Kitchen atorvastatin (LIPITOR) 10 MG tablet Take 1 tablet (10 mg total) by mouth daily.  . Blood Glucose Monitoring Suppl (ACCU-CHEK AVIVA PLUS) w/Device KIT Test BS BID and prn Dx E11.21  . carvedilol (COREG) 12.5 MG tablet Take 1 tablet (12.5 mg total) by mouth 2 (two) times daily with a meal.  . folic acid (FOLVITE) 1  MG tablet Take 1 tablet (1 mg total) by mouth daily.  Marland Kitchen gabapentin (NEURONTIN) 400 MG capsule Take 1 capsule (400 mg total) by mouth at bedtime.  Marland Kitchen glucose blood (ACCU-CHEK AVIVA PLUS) test strip CHECK BLOOD SUGAR 2 TIMES A DAY AND AS NEEDED Dx E11.21  . insulin glargine (LANTUS SOLOSTAR) 100 UNIT/ML Solostar Pen Inject 5-10 Units into the skin at bedtime.  Marland Kitchen leflunomide (ARAVA) 20 MG tablet Take 1 tablet (20 mg total) by mouth daily. Can restart 06/04/19  . Omega-3 Fatty Acids (FISH OIL) 1000 MG CAPS   . OPTIMAL-D 1.25 MG (50000 UT) capsule TAKE 1 CAPSULE BY MOUTH ONCE A WEEK FOR 8 DOSES  . pantoprazole (PROTONIX) 40 MG tablet TAKE 1 TABLET BY MOUTH DAILY.   No facility-administered encounter medications on file as of 07/22/2020.    Patient Active Problem List   Diagnosis Date Noted  . Acute respiratory failure with hypoxia ([place]) 05/24/2019  . COVID-19 virus detected   . Acute metabolic encephalopathy 30/86/5784  . Hypokalemia 05/22/2019  . Hypomagnesemia 05/22/2019  . Elevated troponin 05/22/2019  . Pneumonia due to COVID-19 virus 05/21/2019  . Hyperlipidemia associated with type 2 diabetes mellitus ([place]) 07/07/2018  . Noncompliance with medication regimen 07/07/2018  . Palpitations 05/07/2018  . Chest pain 05/07/2018  . RA (rheumatoid arthritis) ([place]) 03/20/2017  . Mixed hyperlipidemia 02/13/2017  . Hypertension associated with stage 3 chronic kidney disease due to type 2 diabetes mellitus ([place]) 12/24/2016  . Personal history of noncompliance with medical treatment, presenting hazards to health 12/24/2016  .  Bulging lumbar disc 08/22/2016  . Syncope 02/10/2012  . Ataxia 02/10/2012  . Uncontrolled type 2 diabetes mellitus with diabetic nephropathy, with long-term current use of insulin ([place]) 02/10/2012    Conditions to be addressed/monitored:HTN, COPD and CKD Stage 3  Care Plan : RNCM: Diabetes  Updates made by Ilean China, RN since 07/22/2020 12:00 AM    Problem: Diabetes  Management   Priority: High    Long-Range Goal: Monitor and Manage My Blood Sugar-Diabetes Type 2   Start Date: 06/23/2020  This Visit's Progress: Not on track  Recent Progress: Not on track  Priority: High  Note:   Current Barriers:  . Chronic Disease Management support and education needs related to diabetes in a patient with hypertension and CKD . Lacks caregiver support.  . Non-adherence to prescribed medication regimen . Unable to independently drive . Irregular eating pattern  Nurse Case Manager Clinical Goal(s):  Marland Kitchen Over the next 14 days, patient will work with [facility] clinical staff to have [name] sensor placed to monitor blood sugar continuously for 2 weeks . Over the next 14 days, patient will meet with RN Care Manager to address self-management of diabetes . Over the next 30 days, patient will demonstrate improved self-care management by taking medication as prescribed  Interventions:  . 1:1 collaboration with Erica Norlander, DO regarding development and update of comprehensive plan of care as evidenced by provider attestation and co-signature . Inter-disciplinary care team collaboration (see longitudinal plan of care) . Evaluation of current treatment plan related to diabetes and patient's adherence to plan as established by provider. . Chart reviewed including relevant office notes and lab results . Reviewed and discussed medications o She is still taking 14 units of Lantus at bedtime despite PCP's advice and my reminder during our last telephone visit o Reinforced that she should only be taking 5-10 units of lantus at bedtime.  . Again discussed professional [name] Sensor for continuous glucose monitoring for 2 weeks. Patient is willing to do this.  . Previously collaborated with [facility] clinical staff and requested that they contact patient to schedule a visit for sensor placement o Clinical staff attempted to call but was unable to reach patient.  . New  telephone message sent asking them to reach out to the patient again to schedule libre sensor placement . Discussed diet o Patient had an irregular eating pattern but says that she is now trying to eat at least 3 meals a day at regular intervals . Discussed home blood sugar testing o Reinforced that she should be checking it fasting in the morning and before lantus in the evening and any other time that she feels it may be too high or to low o 153 this morning o No readings below 70 or above 300 . Advised patient to call PCP with any blood sugar readings below 70 or above 300 (per PCP recommendation) . Discussed hypoglycemia and patient reports that she can tell when her blood sugar is too low or too high . Encouraged patient to reach out to [facility] as needed  Patient Goals/Self-Care Activities Over the next 14 days, patient will: . Check blood sugar before breakfast and before taking Lantus in the evening and any time you feel it is too high or too low . Only take 5 units of Lantus in the evening per Dr Lajuana Ripple . Eat at least 3 meals a day and space them out reguarly o Plate method with half plate of non-starchy vegetables, 1/4  plate with a protein, 1/4 plate with a carbohydrate . Call PCP at [phone] if blood sugar readings are below 70 or above 300 . Schedule a time to come into [facility] for [facility] sensor placement. This will continuously check your blood sugar for two weeks so that we can see the pattern . Reach out to RN Care Manager as needed [phone]    Care Plan : RNCM: Chronic Kidney (Adult)  Updates made by Ilean China, RN since 07/22/2020 12:00 AM    Problem: Disease Progression     Long-Range Goal: Disease Progression Prevented or Minimized   Start Date: 07/22/2020  This Visit's Progress: On track  Priority: Low  Note:   Current Barriers:  . Chronic Disease Management support and education needs related to chronic kidney disease in a patient with hypertension  and diabetes . Unable to independently drive  Nurse Case Manager Clinical Goal(s):  Marland Kitchen Over the next 180 days, the patient will demonstrate ongoing self health care management ability as evidenced by following up with nephrologist as directed  Interventions:  . 1:1 collaboration with Erica Norlander, DO regarding development and update of comprehensive plan of care as evidenced by provider attestation and co-signature . Inter-disciplinary care team collaboration (see longitudinal plan of care) . Evaluation of current treatment plan related to CKD and patient's adherence to plan as established by provider. . Reviewed relevant office notes and lab results including nephrology note from last visit in 03/2020 . Discussed need for f/u with nephrology in 09/2020 . Reviewed and discussed medications . Discussed impact of diabetes and hypertension on kidney function . Talked about whether patient has any fluid or protein restrictions o Patient isn't aware of any o Recent office notes did not indicate any specific dietary restrictions . Encouraged patient to take medications as prescribed and to keep blood sugar and blood pressure under control to minimize impact on kidney function . Provided with RN Care Manager telephone number and encouraged to reach out as needed  Patient Goals/Self-Care Activities Over the next 180 days, patient will: . Take all medication as prescribed . Call nephrologist or PCP if you have any questions or concerns . Keep all follow-up appointments with nephrologist and PCP . Manage blood pressure and blood sugar by checking them regularly and taking your medication . Call PCP if you blood pressure or blood sugar is running higher than recommended        Follow Up Plan:  . Telephone follow up appointment with care management team member scheduled for:07/28/20 with RN Care Manager . The patient has been provided with contact information for the care management team  and has been advised to call with any health related questions or concerns.  . Next PCP appointment scheduled for: 10/14/20 with Dr Lajuana Ripple  Chong Sicilian, BSN, RN-BC [place] / [place] Management Direct Dial: [phone]

## 2020-07-22 NOTE — Telephone Encounter (Signed)
07/22/2020  Patient needs to be scheduled with triage for professional freestyle libre sensor placement. Triage has tried to ocntact in the recent past but was unable to talk with patient. She says that she will be available today to answer the call.   Forwarding to [facility] Clinical staff for scheduling.   Chong Sicilian, BSN, RN-BC Embedded Chronic Care Manager [facility] / [place] Management Direct Dial: [phone]

## 2020-07-22 NOTE — Patient Instructions (Signed)
Visit Information  PATIENT GOALS: Goals Addressed            This Visit's Progress   . Follow My Treatment Plan-Chronic Kidney   On track    Timeframe:  Long-Range Goal Priority:  Low Start Date: 07/22/20                            Expected End Date: 01/19/21                      Follow Up Date 07/28/20    . Take all medication as prescribed . Call nephrologist or PCP if you have any questions or concerns . Keep all follow-up appointments with nephrologist and PCP . Manage blood pressure and blood sugar by checking them regularly and taking your medication . Call PCP if you blood pressure or blood sugar is running higher than recommended    Why is this important?    Staying as healthy as you can is very important. This may mean making changes if you smoke, don't exercise or eat poorly.   A healthy lifestyle is an important goal for you.   Following the treatment plan and making changes may be hard.   Try some of these steps to help keep the disease from getting worse.     Notes:     Marland Kitchen Monitor and Manage My Blood Sugar-Diabetes Type 2   Not on track    Timeframe:  Long-Range Goal Priority:  High Start Date:                             Expected End Date:                       Follow Up Date 07/04/2020   . Check blood sugar before breakfast and before taking Lantus in the evening and any time you feel it is too high or too low . Only take 5 units of Lantus in the evening per Dr Lajuana Ripple . Eat at least 3 meals a day and space them out reguarly o Plate method with half plate of non-starchy vegetables, 1/4 plate with a protein, 1/4 plate with a carbohydrate . Call PCP at [phone] if blood sugar readings are below 70 or above 300 . Schedule a time to come into [facility] for [facility] [place] sensor placement. This will continuously check your blood sugar for two weeks so that we can see the pattern . Reach out to RN Care Manager as needed [phone]   Why is this important?     Checking your blood sugar at home helps to keep it from getting very high or very low.   Writing the results in a diary or log helps the doctor know how to care for you.   Your blood sugar log should have the time, date and the results.   Also, write down the amount of insulin or other medicine that you take.   Other information, like what you ate, exercise done and how you were feeling, will also be helpful.     Notes:        Patient verbalizes understanding of instructions provided today and agrees to view in [place].    Follow Up Plan:  . Telephone follow up appointment with care management team member scheduled for:07/28/20 with RN Care Manager . The patient has been provided with  contact information for the care management team and has been advised to call with any health related questions or concerns.  . Next PCP appointment scheduled for: 10/14/20 with Dr Lajuana Ripple  Chong Sicilian, BSN, RN-BC [place] / [place] Management Direct Dial: [phone]

## 2020-07-22 NOTE — Telephone Encounter (Signed)
NA, NVM 

## 2020-07-28 ENCOUNTER — Ambulatory Visit: Payer: Medicare Other | Admitting: *Deleted

## 2020-07-28 DIAGNOSIS — I129 Hypertensive chronic kidney disease with stage 1 through stage 4 chronic kidney disease, or unspecified chronic kidney disease: Secondary | ICD-10-CM

## 2020-07-28 DIAGNOSIS — Z794 Long term (current) use of insulin: Secondary | ICD-10-CM | POA: Diagnosis not present

## 2020-07-28 DIAGNOSIS — E1165 Type 2 diabetes mellitus with hyperglycemia: Secondary | ICD-10-CM | POA: Diagnosis not present

## 2020-07-28 DIAGNOSIS — E1121 Type 2 diabetes mellitus with diabetic nephropathy: Secondary | ICD-10-CM | POA: Diagnosis not present

## 2020-07-28 DIAGNOSIS — N183 Chronic kidney disease, stage 3 unspecified: Secondary | ICD-10-CM | POA: Diagnosis not present

## 2020-07-28 DIAGNOSIS — E782 Mixed hyperlipidemia: Secondary | ICD-10-CM | POA: Diagnosis not present

## 2020-07-28 DIAGNOSIS — IMO0002 Reserved for concepts with insufficient information to code with codable children: Secondary | ICD-10-CM

## 2020-07-28 DIAGNOSIS — E1122 Type 2 diabetes mellitus with diabetic chronic kidney disease: Secondary | ICD-10-CM | POA: Diagnosis not present

## 2020-07-28 NOTE — Chronic Care Management (AMB) (Signed)
Chronic Care Management   CCM RN Visit Note  07/28/2020 Name: Erica Crosby MRN: [id number] DOB: 19-Aug-1945  Subjective: Erica Crosby is a 75 y.o. year old female who is a primary care patient of [name], DO. The care management team was consulted for assistance with disease management and care coordination needs.    Engaged with patient by telephone for follow up visit in response to provider referral for case management and/or care coordination services.   Consent to Services:  The patient was given information about Chronic Care Management services, agreed to services, and gave verbal consent prior to initiation of services.  Please see initial visit note for detailed documentation.   Patient agreed to services and verbal consent obtained.   Assessment: Review of patient past medical history, allergies, medications, health status, including review of consultants reports, laboratory and other test data, was performed as part of comprehensive evaluation and provision of chronic care management services.   SDOH (Social Determinants of Health) assessments and interventions performed:    CCM Care Plan  No Known Allergies  Outpatient Encounter Medications as of 07/28/2020  Medication Sig  . Accu-Chek Softclix Lancets lancets CHECK BLOOD SUGAR TWICE A DAY AND AS NEEDED Dx E11.21  . Adalimumab (HUMIRA PEN) 40 MG/0.4ML PNKT Inject 40 mg into the skin every 14 (fourteen) days. Can restart 06/04/19  . alendronate (FOSAMAX) 70 MG tablet TAKE 1 TABLET ONCE A WEEK. TAKE WITH A FULL GLASS OF WATER ON AN EMPTY STOMACH  . aspirin EC 81 MG tablet Take 81 mg by mouth daily.  Marland Kitchen atorvastatin (LIPITOR) 10 MG tablet Take 1 tablet (10 mg total) by mouth daily.  . Blood Glucose Monitoring Suppl (ACCU-CHEK AVIVA PLUS) w/Device KIT Test BS BID and prn Dx E11.21  . carvedilol (COREG) 12.5 MG tablet Take 1 tablet (12.5 mg total) by mouth 2 (two) times daily with a meal.  . folic acid (FOLVITE) 1 MG  tablet Take 1 tablet (1 mg total) by mouth daily.  Marland Kitchen gabapentin (NEURONTIN) 400 MG capsule Take 1 capsule (400 mg total) by mouth at bedtime.  Marland Kitchen glucose blood (ACCU-CHEK AVIVA PLUS) test strip CHECK BLOOD SUGAR 2 TIMES A DAY AND AS NEEDED Dx E11.21  . insulin glargine (LANTUS SOLOSTAR) 100 UNIT/ML Solostar Pen Inject 5-10 Units into the skin at bedtime.  Marland Kitchen leflunomide (ARAVA) 20 MG tablet Take 1 tablet (20 mg total) by mouth daily. Can restart 06/04/19  . Omega-3 Fatty Acids (FISH OIL) 1000 MG CAPS   . OPTIMAL-D 1.25 MG (50000 UT) capsule TAKE 1 CAPSULE BY MOUTH ONCE A WEEK FOR 8 DOSES  . pantoprazole (PROTONIX) 40 MG tablet TAKE 1 TABLET BY MOUTH DAILY.   No facility-administered encounter medications on file as of 07/28/2020.    Patient Active Problem List   Diagnosis Date Noted  . Acute respiratory failure with hypoxia ([place]) 05/24/2019  . COVID-19 virus detected   . Acute metabolic encephalopathy 37/85/8850  . Hypokalemia 05/22/2019  . Hypomagnesemia 05/22/2019  . Elevated troponin 05/22/2019  . Pneumonia due to COVID-19 virus 05/21/2019  . Hyperlipidemia associated with type 2 diabetes mellitus ([place]) 07/07/2018  . Noncompliance with medication regimen 07/07/2018  . Palpitations 05/07/2018  . Chest pain 05/07/2018  . RA (rheumatoid arthritis) ([place]) 03/20/2017  . Mixed hyperlipidemia 02/13/2017  . Hypertension associated with stage 3 chronic kidney disease due to type 2 diabetes mellitus ([place]) 12/24/2016  . Personal history of noncompliance with medical treatment, presenting hazards to health 12/24/2016  .  Bulging lumbar disc 08/22/2016  . Syncope 02/10/2012  . Ataxia 02/10/2012  . Uncontrolled type 2 diabetes mellitus with diabetic nephropathy, with long-term current use of insulin ([place]) 02/10/2012    Conditions to be addressed/monitored:HTN, DMII and CKD Stage 3  Care Plan : RNCM: Diabetes  Updates made by Ilean China, RN since 07/28/2020 12:00 AM    Problem: Diabetes  Management   Priority: High    Long-Range Goal: Monitor and Manage My Blood Sugar-Diabetes Type 2   Start Date: 06/23/2020  This Visit's Progress: Not on track  Recent Progress: Not on track  Priority: High  Note:   Current Barriers:  . Chronic Disease Management support and education needs related to diabetes in a patient with hypertension and CKD . Lacks caregiver support.  . Non-adherence to prescribed medication regimen . Unable to independently drive  Nurse Case Manager Clinical Goal(s):  Marland Kitchen Over the next 14 days, patient will work with [facility] clinical staff to have [name] sensor placed to monitor blood sugar continuously for 2 weeks . Over the next 30 days, patient will meet with RN Care Manager to address self-management of diabetes . Over the next 30 days, patient will demonstrate improved self-care management by taking medication as prescribed  Interventions:  . 1:1 collaboration with [name], DO regarding development and update of comprehensive plan of care as evidenced by provider attestation and co-signature . Inter-disciplinary care team collaboration (see longitudinal plan of care) . Evaluation of current treatment plan related to diabetes and patient's adherence to plan as established by provider. . Chart reviewed including relevant office notes and lab results . Reviewed and discussed medications o She is now taking 10 units of Lantus at bedtime . Collaborated with [facility] clinical staff to schedule patient for professional [facility] Sensor placement on 08/03/20 . Discussed diet o Patient had an irregular eating pattern but says that she is now trying to eat at least 3 meals a day at regular intervals . Discussed home blood sugar testing o Reinforced that she should be checking it fasting in the morning and before lantus in the evening and any other time that she feels it may be too high or to low o 117 this morning o No readings below 70 or above  300 . Advised patient to call PCP with any blood sugar readings below 70 or above 300 (per PCP recommendation) . Discussed hypoglycemia and patient reports that she can tell when her blood sugar is too low or too high . Encouraged patient to reach out to [facility] as needed  Patient Goals/Self-Care Activities Over the next 14 days, patient will: . Check blood sugar before breakfast and before taking Lantus in the evening and any time you feel it is too high or too low . Only take 5-10 units of Lantus at bedtime . Eat at least 3 meals a day and space them out reguarly o Plate method with half plate of non-starchy vegetables, 1/4 plate with a protein, 1/4 plate with a carbohydrate . Call PCP at [phone] if blood sugar readings are below 70 or above 300 . Go to [facility] on 08/03/20 to have Freestyle [place] Sensor placed o Keep sensor on for at least 3 days and preferably 14 days o Remove sensor after 14 days and return to [facility] o If the sensor falls off before the end of 3 days, call RNCM o If sensor falls off after the end of 3 days, go ahead and return it to  [facility] . Reach out to RN Care Manager as needed [phone]       Follow Up Plan:  . Telephone follow up appointment with care management team member scheduled for: with RN Care Manager 08/16/20 . The patient has been provided with contact information for the care management team and has been advised to call with any health related questions or concerns.  . Next PCP appointment scheduled for: 10/14/20 with Dr Lajuana Ripple . Nurse visit for sensor placement on 08/03/20  Chong Sicilian, BSN, RN-BC [place] / [place] Management Direct Dial: [phone]

## 2020-07-28 NOTE — Patient Instructions (Signed)
Visit Information  PATIENT GOALS: Goals Addressed            This Visit's Progress   . Monitor and Manage My Blood Sugar-Diabetes Type 2       Timeframe:  Long-Range Goal Priority:  High Start Date:                             Expected End Date:                       Follow Up Date 08/16/20   . Check blood sugar before breakfast and before taking Lantus in the evening and any time you feel it is too high or too low . Only take 5-10 units of Lantus at bedtime . Eat at least 3 meals a day and space them out reguarly o Plate method with half plate of non-starchy vegetables, 1/4 plate with a protein, 1/4 plate with a carbohydrate . Call PCP at [phone] if blood sugar readings are below 70 or above 300 . Go to [facility] on 08/03/20 to have Freestyle [place] Sensor placed o Keep sensor on for at least 3 days and preferably 14 days o Remove sensor after 14 days and return to [facility] o If the sensor falls off before the end of 3 days, call RNCM o If sensor falls off after the end of 3 days, go ahead and return it to [facility] . Reach out to RN Care Manager as needed [phone]   Why is this important?    Checking your blood sugar at home helps to keep it from getting very high or very low.   Writing the results in a diary or log helps the doctor know how to care for you.   Your blood sugar log should have the time, date and the results.   Also, write down the amount of insulin or other medicine that you take.   Other information, like what you ate, exercise done and how you were feeling, will also be helpful.     Notes:        Patient verbalizes understanding of instructions provided today and agrees to view in [place].   Follow Up Plan:  . Telephone follow up appointment with care management team member scheduled for: with RN Care Manager 08/16/20 . The patient has been provided with contact information for the care management team and has been advised to call with any health related  questions or concerns.  . Next PCP appointment scheduled for: 10/14/20 with Dr Lajuana Ripple . Nurse visit for sensor placement on 08/03/20  Chong Sicilian, BSN, RN-BC [place] [facility] [place] Management Direct Dial: [phone]

## 2020-08-01 DIAGNOSIS — E119 Type 2 diabetes mellitus without complications: Secondary | ICD-10-CM | POA: Diagnosis not present

## 2020-08-01 DIAGNOSIS — M0510 Rheumatoid lung disease with rheumatoid arthritis of unspecified site: Secondary | ICD-10-CM | POA: Diagnosis not present

## 2020-08-01 DIAGNOSIS — Z79899 Other long term (current) drug therapy: Secondary | ICD-10-CM | POA: Diagnosis not present

## 2020-08-01 DIAGNOSIS — M810 Age-related osteoporosis without current pathological fracture: Secondary | ICD-10-CM | POA: Diagnosis not present

## 2020-08-01 DIAGNOSIS — N289 Disorder of kidney and ureter, unspecified: Secondary | ICD-10-CM | POA: Diagnosis not present

## 2020-08-01 DIAGNOSIS — R110 Nausea: Secondary | ICD-10-CM | POA: Diagnosis not present

## 2020-08-01 DIAGNOSIS — M79641 Pain in right hand: Secondary | ICD-10-CM | POA: Diagnosis not present

## 2020-08-01 DIAGNOSIS — M069 Rheumatoid arthritis, unspecified: Secondary | ICD-10-CM | POA: Diagnosis not present

## 2020-08-03 ENCOUNTER — Other Ambulatory Visit: Payer: Medicare Other

## 2020-08-03 ENCOUNTER — Other Ambulatory Visit: Payer: Self-pay

## 2020-08-03 ENCOUNTER — Ambulatory Visit: Payer: Medicare Other

## 2020-08-03 DIAGNOSIS — M069 Rheumatoid arthritis, unspecified: Secondary | ICD-10-CM | POA: Diagnosis not present

## 2020-08-08 ENCOUNTER — Other Ambulatory Visit: Payer: Self-pay | Admitting: Family Medicine

## 2020-08-11 ENCOUNTER — Ambulatory Visit: Payer: Medicare Other | Admitting: Licensed Clinical Social Worker

## 2020-08-11 DIAGNOSIS — E1121 Type 2 diabetes mellitus with diabetic nephropathy: Secondary | ICD-10-CM

## 2020-08-11 DIAGNOSIS — E1165 Type 2 diabetes mellitus with hyperglycemia: Secondary | ICD-10-CM

## 2020-08-11 DIAGNOSIS — Z794 Long term (current) use of insulin: Secondary | ICD-10-CM

## 2020-08-11 DIAGNOSIS — N183 Chronic kidney disease, stage 3 unspecified: Secondary | ICD-10-CM

## 2020-08-11 DIAGNOSIS — E1122 Type 2 diabetes mellitus with diabetic chronic kidney disease: Secondary | ICD-10-CM | POA: Diagnosis not present

## 2020-08-11 DIAGNOSIS — E782 Mixed hyperlipidemia: Secondary | ICD-10-CM

## 2020-08-11 DIAGNOSIS — I129 Hypertensive chronic kidney disease with stage 1 through stage 4 chronic kidney disease, or unspecified chronic kidney disease: Secondary | ICD-10-CM

## 2020-08-11 DIAGNOSIS — R270 Ataxia, unspecified: Secondary | ICD-10-CM

## 2020-08-11 DIAGNOSIS — IMO0002 Reserved for concepts with insufficient information to code with codable children: Secondary | ICD-10-CM

## 2020-08-11 NOTE — Patient Instructions (Addendum)
Licensed Clinical Social Worker Visit Information  Goals we discussed today:   .  Client will talk with LCSW in next 30 days to discuss ADLs completion of client (pt-stated)        CARE PLAN ENTRY   Current Barriers:   Transport challenges Ambulation challenges (uses a cane to walk) in patient with Chronic Diagnoses of Ataxia, Type 2 DM, HTN, Mixed Hyperlipidemia   Clinical Social Work Clinical Goal(s):   LCSW will call client in next 30 days to talk with client /daughter about ADLs completion of client  Interventions:  Talked with client about medication procurement for client Talked with client about CCM program Talked with client about RNCM support with CCM program Talked with client about her managing Diabetes Talked with client about client appetite Talked with client about client completion of ADLs Talked with client about upcoming client medical  appointments Talked with client about hearing issues of client Talked with client about pain issues of client Talked with client about DME equipment use of client (has shower chair) Talked with client about decreased energy of client Talked with client about vision of client (client said she uses eye drops as ordered) Talked with Anayra about her ambulation challenges (uses a cane to help her walk) Talked with [name] about sleeping challenges of client Collaborated with RNCM regarding nursing needs of client  Patient Self Care Activities:   Eats meals with set up assistance Does some ADLs indedpendently  Patient Self Care Deficits  Transport challenges  Mobility challenges  Initial goal documentation     Follow Up Plan:LCSW to call client or daughter of client in next 4 weeks to talk about client completion of daily ADLs  Materials Provided: No  The patient verbalized understanding of instructions provided today and declined a print copy of patient instruction materials.   [name].Anddy Wingert MSW,  LCSW Licensed Clinical Social Worker [facility] Care Management [phone]

## 2020-08-11 NOTE — Chronic Care Management (AMB) (Signed)
Chronic Care Management    Clinical Social Work Follow Up Note  08/11/2020 Name: Erica Crosby MRN: [id number] DOB: Sep 27, 1945  Erica Crosby is a 75 y.o. year old female who is a primary care patient of Erica Norlander, DO. The CCM team was consulted for assistance with [name] .   Review of patient status, including review of consultants reports, other relevant assessments, and collaboration with appropriate care team members and the patient's provider was performed as part of comprehensive patient evaluation and provision of chronic care management services.    SDOH (Social Determinants of Health) assessments performed: No; risk for depression; risk for social isolation; risk for tobacco use; risk for stress; risk for physical inactivity  [place] Office Visit from 06/15/2020 in [place]  PHQ-9 Total Score 11     GAD 7 : Generalized Anxiety Score 10/21/2019  Nervous, Anxious, on Edge 0  Control/stop worrying 0  Worry too much - different things 0  Trouble relaxing 0  Restless 0  Easily annoyed or irritable 0  Afraid - awful might happen 0  Total GAD 7 Score 0  Anxiety Difficulty Somewhat difficult    Outpatient Encounter Medications as of 08/11/2020  Medication Sig   Accu-Chek Softclix Lancets lancets CHECK BLOOD SUGAR TWICE A DAY AND AS NEEDED Dx E11.21   Adalimumab (HUMIRA PEN) 40 MG/0.4ML PNKT Inject 40 mg into the skin every 14 (fourteen) days. Can restart 06/04/19   alendronate (FOSAMAX) 70 MG tablet TAKE 1 TABLET ONCE A WEEK. TAKE WITH A FULL GLASS OF WATER ON AN EMPTY STOMACH   aspirin EC 81 MG tablet Take 81 mg by mouth daily.   atorvastatin (LIPITOR) 10 MG tablet Take 1 tablet (10 mg total) by mouth daily.   Blood Glucose Monitoring Suppl (ACCU-CHEK AVIVA PLUS) w/Device KIT Test BS BID and prn Dx E11.21   carvedilol (COREG) 12.5 MG tablet Take 1 tablet (12.5 mg total) by mouth 2 (two) times daily with a meal.   folic  acid (FOLVITE) 1 MG tablet Take 1 tablet (1 mg total) by mouth daily.   gabapentin (NEURONTIN) 400 MG capsule TAKE 1 CAPSULE BY MOUTH AT BEDTIME.   glucose blood (ACCU-CHEK AVIVA PLUS) test strip CHECK BLOOD SUGAR 2 TIMES A DAY AND AS NEEDED Dx E11.21   insulin glargine (LANTUS SOLOSTAR) 100 UNIT/ML Solostar Pen Inject 5-10 Units into the skin at bedtime.   leflunomide (ARAVA) 20 MG tablet Take 1 tablet (20 mg total) by mouth daily. Can restart 06/04/19   Omega-3 Fatty Acids (FISH OIL) 1000 MG CAPS    OPTIMAL-D 1.25 MG (50000 UT) capsule TAKE 1 CAPSULE BY MOUTH ONCE A WEEK FOR 8 DOSES   pantoprazole (PROTONIX) 40 MG tablet TAKE 1 TABLET BY MOUTH DAILY.   No facility-administered encounter medications on file as of 08/11/2020.    Goals      Client will talk with LCSW in next 30 days to discuss ADLs completion of client (pt-stated)      CARE PLAN ENTRY   Current Barriers:   Transport challenges Ambulation challenges (uses a cane to walk) in patient with Chronic Diagnoses of Ataxia, Type 2 DM, HTN, Mixed Hyperlipidemia   Clinical Social Work Clinical Goal(s):   LCSW will call client in next 30 days to talk with client /daughter about ADLs completion of client  Interventions:  Talked with client about medication procurement for client Talked with client about CCM program Talked with client about RNCM support with  CCM program Talked with client about her managing Diabetes Talked with client about client appetite Talked with client about client completion of ADLs Talked with client about upcoming client medical  appointments Talked with client about hearing issues of client Talked with client about pain issues of client Talked with client about DME equipment use of client (has shower chair) Talked with client about decreased energy of client Talked with client about vision of client (client said she uses eye drops as ordered) Talked with Erica Crosby about her ambulation challenges  (uses a cane to help her walk) Talked with Erica Crosby about sleeping challenges of client Collaborated with RNCM regarding nursing needs of client  Patient Self Care Activities:   Eats meals with set up assistance Does some ADLs indedpendently  Patient Self Care Deficits  Transport challenges  Mobility challenges  Initial goal documentation     Follow Up Plan:LCSW to call client or daughter of client in next 4 weeks to talk about client completion of daily ADLs  Erica Crosby MSW, LCSW Licensed Clinical Social Worker [facility] Care Management [phone]

## 2020-08-16 ENCOUNTER — Ambulatory Visit: Payer: Medicare Other | Admitting: *Deleted

## 2020-08-16 DIAGNOSIS — E1122 Type 2 diabetes mellitus with diabetic chronic kidney disease: Secondary | ICD-10-CM

## 2020-08-16 DIAGNOSIS — I129 Hypertensive chronic kidney disease with stage 1 through stage 4 chronic kidney disease, or unspecified chronic kidney disease: Secondary | ICD-10-CM

## 2020-08-16 DIAGNOSIS — E1165 Type 2 diabetes mellitus with hyperglycemia: Secondary | ICD-10-CM

## 2020-08-16 DIAGNOSIS — IMO0002 Reserved for concepts with insufficient information to code with codable children: Secondary | ICD-10-CM

## 2020-08-18 DIAGNOSIS — E1142 Type 2 diabetes mellitus with diabetic polyneuropathy: Secondary | ICD-10-CM | POA: Diagnosis not present

## 2020-08-18 DIAGNOSIS — B351 Tinea unguium: Secondary | ICD-10-CM | POA: Diagnosis not present

## 2020-08-18 DIAGNOSIS — L84 Corns and callosities: Secondary | ICD-10-CM | POA: Diagnosis not present

## 2020-08-18 DIAGNOSIS — M79676 Pain in unspecified toe(s): Secondary | ICD-10-CM | POA: Diagnosis not present

## 2020-08-26 NOTE — Patient Instructions (Signed)
Visit Information  PATIENT GOALS: Goals Addressed            This Visit's Progress   . Monitor and Manage My Blood Sugar-Diabetes Type 2       Timeframe:  Long-Range Goal Priority:  High Start Date:                             Expected End Date:                       Follow Up Date 08/16/20   . Check blood sugar before breakfast and before taking Lantus in the evening and any time you feel it is too high or too low . Only take 5-10 units of Lantus at bedtime . Eat at least 3 meals a day and space them out reguarly o Plate method with half plate of non-starchy vegetables, 1/4 plate with a protein, 1/4 plate with a carbohydrate . Call PCP at [phone] if blood sugar readings are below 70 or above 300 . Reach out to RN Care Manager as needed [phone]   Why is this important?    Checking your blood sugar at home helps to keep it from getting very high or very low.   Writing the results in a diary or log helps the doctor know how to care for you.   Your blood sugar log should have the time, date and the results.   Also, write down the amount of insulin or other medicine that you take.   Other information, like what you ate, exercise done and how you were feeling, will also be helpful.     Notes:        Follow Up Plan:  . Telephone follow up appointment with care management team member scheduled for: with RN Care Manager 09/08/20 . The patient has been provided with contact information for the care management team and has been advised to call with any health related questions or concerns.  . Next PCP appointment scheduled for: 10/14/20 with Dr Lajuana Ripple  Chong Sicilian, BSN, RN-BC [place] / [place] Management Direct Dial: [phone]

## 2020-08-26 NOTE — Chronic Care Management (AMB) (Signed)
Chronic Care Management   CCM RN Visit Note  08/16/2020 Name: Erica Crosby MRN: [id number] DOB: Mar 21, 1946  Subjective: Erica Crosby is a 75 y.o. year old female who is a primary care patient of Erica Norlander, DO. The care management team was consulted for assistance with disease management and care coordination needs.    Collaboration with [facility] Clinical Staff and LCSW for blood sugar management and freestyle libre sensor placement in response to provider referral for case management and/or care coordination services.   Consent to Services:  The patient was previously given information about Chronic Care Management services, agreed to services, and gave verbal consent prior to initiation of services.  Please see initial visit note for detailed documentation.   Assessment: Review of patient past medical history, allergies, medications, health status, including review of consultants reports, laboratory and other test data, was performed as part of comprehensive evaluation and provision of chronic care management services.   SDOH (Social Determinants of Health) assessments and interventions performed:    CCM Care Plan  No Known Allergies  Outpatient Encounter Medications as of 08/16/2020  Medication Sig  . Accu-Chek Softclix Lancets lancets CHECK BLOOD SUGAR TWICE A DAY AND AS NEEDED Dx E11.21  . Adalimumab (HUMIRA PEN) 40 MG/0.4ML PNKT Inject 40 mg into the skin every 14 (fourteen) days. Can restart 06/04/19  . alendronate (FOSAMAX) 70 MG tablet TAKE 1 TABLET ONCE A WEEK. TAKE WITH A FULL GLASS OF WATER ON AN EMPTY STOMACH  . aspirin EC 81 MG tablet Take 81 mg by mouth daily.  [place] atorvastatin (LIPITOR) 10 MG tablet Take 1 tablet (10 mg total) by mouth daily.  . Blood Glucose Monitoring Suppl (ACCU-CHEK AVIVA PLUS) w/Device KIT Test BS BID and prn Dx E11.21  . carvedilol (COREG) 12.5 MG tablet Take 1 tablet (12.5 mg total) by mouth 2 (two) times daily with a meal.  . folic acid  (FOLVITE) 1 MG tablet Take 1 tablet (1 mg total) by mouth daily.  [place] gabapentin (NEURONTIN) 400 MG capsule TAKE 1 CAPSULE BY MOUTH AT BEDTIME.  [place] Kitchen glucose blood (ACCU-CHEK AVIVA PLUS) test strip CHECK BLOOD SUGAR 2 TIMES A DAY AND AS NEEDED Dx E11.21  . insulin glargine (LANTUS SOLOSTAR) 100 UNIT/ML Solostar Pen Inject 5-10 Units into the skin at bedtime.  [place] Kitchen leflunomide (ARAVA) 20 MG tablet Take 1 tablet (20 mg total) by mouth daily. Can restart 06/04/19  . Omega-3 Fatty Acids (FISH OIL) 1000 MG CAPS   . OPTIMAL-D 1.25 MG (50000 UT) capsule TAKE 1 CAPSULE BY MOUTH ONCE A WEEK FOR 8 DOSES  . pantoprazole (PROTONIX) 40 MG tablet TAKE 1 TABLET BY MOUTH DAILY.   No facility-administered encounter medications on file as of 08/16/2020.    Patient Active Problem List   Diagnosis Date Noted  . Acute respiratory failure with hypoxia ([place]) 05/24/2019  . COVID-19 virus detected   . Acute metabolic encephalopathy 17/51/0258  . Hypokalemia 05/22/2019  . Hypomagnesemia 05/22/2019  . Elevated troponin 05/22/2019  . Pneumonia due to COVID-19 virus 05/21/2019  . Hyperlipidemia associated with type 2 diabetes mellitus ([place]) 07/07/2018  . Noncompliance with medication regimen 07/07/2018  . Palpitations 05/07/2018  . Chest pain 05/07/2018  . RA (rheumatoid arthritis) ([place]) 03/20/2017  . Mixed hyperlipidemia 02/13/2017  . Hypertension associated with stage 3 chronic kidney disease due to type 2 diabetes mellitus ([place]) 12/24/2016  . Personal history of noncompliance with medical treatment, presenting hazards to health 12/24/2016  . Bulging lumbar disc 08/22/2016  .  Syncope 02/10/2012  . Ataxia 02/10/2012  . Uncontrolled type 2 diabetes mellitus with diabetic nephropathy, with long-term current use of insulin ([place]) 02/10/2012    Conditions to be addressed/monitored:HTN and DMII  Care Plan : RNCM: Diabetes  Updates made by Ilean China, RN since 08/26/2020 12:00 AM    Problem: Diabetes Management    Priority: High    Long-Range Goal: Monitor and Manage My Blood Sugar-Diabetes Type 2   Start Date: 06/23/2020  Recent Progress: Not on track  Priority: High  Note:   Current Barriers:  . Chronic Disease Management support and education needs related to diabetes in a patient with hypertension and CKD . Lacks caregiver support.  . Non-adherence to prescribed medication regimen . Unable to independently drive  Nurse Case Manager Clinical Goal(s):  Marland Kitchen Over the next 14 days, patient will work with [facility] clinical staff to have [name] sensor placed to monitor blood sugar continuously for 2 weeks . Over the next 30 days, patient will meet with RN Care Manager to address self-management of diabetes . Over the next 30 days, patient will demonstrate improved self-care management by taking medication as prescribed  Interventions:  . 1:1 collaboration with Erica Norlander, DO regarding development and update of comprehensive plan of care as evidenced by provider attestation and co-signature . Inter-disciplinary care team collaboration (see longitudinal plan of care) . Evaluation of current treatment plan related to diabetes and patient's adherence to plan as established by provider. . Chart reviewed including relevant office notes and lab results . Previously reviewed and discussed medications o She is now taking 10 units of Lantus at bedtime . Collaborated with [facility] clinical staff to schedule patient for professional [name] Sensor placement on 08/03/20 o Patient was unable to have sensor placed because they were not in stock o Staff unsure at this time if they will restock  o They will reach out to schedule if an order is placed and sensors are received . Collaborated with LCSW regarding his call to patient . Previously advised patient to call PCP with any blood sugar readings below 70 or above 300 (per PCP recommendation) . Previously discussed hypoglycemia and patient reports  that she can tell when her blood sugar is too low or too high . Encouraged patient to reach out to [facility] as needed  Patient Goals/Self-Care Activities Over the next 14 days, patient will: . Check blood sugar before breakfast and before taking Lantus in the evening and any time you feel it is too high or too low . Only take 5-10 units of Lantus at bedtime . Eat at least 3 meals a day and space them out reguarly o Plate method with half plate of non-starchy vegetables, 1/4 plate with a protein, 1/4 plate with a carbohydrate . Call PCP at [phone] if blood sugar readings are below 70 or above 300 . Reach out to RN Care Manager as needed [phone]     Follow Up Plan:  . Telephone follow up appointment with care management team member scheduled for: with RN Care Manager 09/08/20 . The patient has been provided with contact information for the care management team and has been advised to call with any health related questions or concerns.  . Next PCP appointment scheduled for: 10/14/20 with Dr Lajuana Ripple  Chong Sicilian, BSN, RN-BC [place] / [place] Management Direct Dial: [phone]

## 2020-09-08 ENCOUNTER — Ambulatory Visit (INDEPENDENT_AMBULATORY_CARE_PROVIDER_SITE_OTHER): Payer: Medicare Other | Admitting: *Deleted

## 2020-09-08 DIAGNOSIS — I129 Hypertensive chronic kidney disease with stage 1 through stage 4 chronic kidney disease, or unspecified chronic kidney disease: Secondary | ICD-10-CM | POA: Diagnosis not present

## 2020-09-08 DIAGNOSIS — E1165 Type 2 diabetes mellitus with hyperglycemia: Secondary | ICD-10-CM | POA: Diagnosis not present

## 2020-09-08 DIAGNOSIS — Z794 Long term (current) use of insulin: Secondary | ICD-10-CM | POA: Diagnosis not present

## 2020-09-08 DIAGNOSIS — IMO0002 Reserved for concepts with insufficient information to code with codable children: Secondary | ICD-10-CM

## 2020-09-08 DIAGNOSIS — E1122 Type 2 diabetes mellitus with diabetic chronic kidney disease: Secondary | ICD-10-CM | POA: Diagnosis not present

## 2020-09-08 DIAGNOSIS — E1121 Type 2 diabetes mellitus with diabetic nephropathy: Secondary | ICD-10-CM

## 2020-09-08 DIAGNOSIS — E782 Mixed hyperlipidemia: Secondary | ICD-10-CM | POA: Diagnosis not present

## 2020-09-08 DIAGNOSIS — M069 Rheumatoid arthritis, unspecified: Secondary | ICD-10-CM | POA: Diagnosis not present

## 2020-09-08 DIAGNOSIS — N183 Chronic kidney disease, stage 3 unspecified: Secondary | ICD-10-CM | POA: Diagnosis not present

## 2020-09-08 NOTE — Patient Instructions (Signed)
Visit Information  PATIENT GOALS: Goals Addressed            This Visit's Progress   . Follow My Treatment Plan-Chronic Kidney   On track    Timeframe:  Long-Range Goal Priority:  Low Start Date: 07/22/20                            Expected End Date: 01/19/21                      Follow Up Date 10/10/20   . Take all medication as prescribed . Call nephrologist or PCP if you have any questions or concerns . Keep all follow-up appointments with nephrologist and PCP . Manage blood pressure and blood sugar by checking them regularly and taking your medication . Call PCP if you blood pressure or blood sugar is running higher than recommended  . Call RN Care Manager as needed [phone]    Why is this important?    Staying as healthy as you can is very important. This may mean making changes if you smoke, don't exercise or eat poorly.   A healthy lifestyle is an important goal for you.   Following the treatment plan and making changes may be hard.   Try some of these steps to help keep the disease from getting worse.     Notes:     Marland Kitchen Monitor and Manage My Blood Sugar-Diabetes Type 2   On track    Timeframe:  Long-Range Goal Priority:  High Start Date:                             Expected End Date:                       Follow Up Date 10/10/20   . Check blood sugar before breakfast and before taking Lantus in the evening and any time you feel it is too high or too low . Only take 5-10 units of Lantus at bedtime . Eat at least 3 meals a day and space them out reguarly o Plate method with half plate of non-starchy vegetables, 1/4 plate with a protein, 1/4 plate with a carbohydrate . Eat a protein anytime you eat a carbohydrate . Read food labels for carbohydrate count and added sugars . Call PCP at [phone] if blood sugar readings are below 70 or above 300 . Reach out to RN Care Manager as needed [phone]   Why is this important?    Checking your blood sugar at home  helps to keep it from getting very high or very low.   Writing the results in a diary or log helps the doctor know how to care for you.   Your blood sugar log should have the time, date and the results.   Also, write down the amount of insulin or other medicine that you take.   Other information, like what you ate, exercise done and how you were feeling, will also be helpful.     Notes:        Patient verbalizes understanding of instructions provided today and agrees to view in [place].   Follow Up Plan:  . Telephone follow up appointment with care management team member scheduled for:10/10/20 with RN Care Manager . The patient has been provided with contact information for the care management team and has  been advised to call with any health related questions or concerns.  . Next PCP appointment scheduled for: 10/14/20 with Dr Lajuana Ripple  Chong Sicilian, BSN, RN-BC [place] / [place] Management Direct Dial: [phone]

## 2020-09-08 NOTE — Chronic Care Management (AMB) (Signed)
Chronic Care Management   CCM RN Visit Note  09/08/2020 Name: Erica Crosby MRN: [id number] DOB: 1946/01/03  Subjective: Erica Crosby is a 75 y.o. year old female who is a primary care patient of [name], DO. The care management team was consulted for assistance with disease management and care coordination needs.    Engaged with patient by telephone for follow up visit in response to provider referral for case management and/or care coordination services.   Consent to Services:  The patient was given information about Chronic Care Management services, agreed to services, and gave verbal consent prior to initiation of services.  Please see initial visit note for detailed documentation.   Patient agreed to services and verbal consent obtained.   Assessment: Review of patient past medical history, allergies, medications, health status, including review of consultants reports, laboratory and other test data, was performed as part of comprehensive evaluation and provision of chronic care management services.   SDOH (Social Determinants of Health) assessments and interventions performed:    CCM Care Plan  No Known Allergies  Outpatient Encounter Medications as of 09/08/2020  Medication Sig  . Accu-Chek Softclix Lancets lancets CHECK BLOOD SUGAR TWICE A DAY AND AS NEEDED Dx E11.21  . Adalimumab (HUMIRA PEN) 40 MG/0.4ML PNKT Inject 40 mg into the skin every 14 (fourteen) days. Can restart 06/04/19  . alendronate (FOSAMAX) 70 MG tablet TAKE 1 TABLET ONCE A WEEK. TAKE WITH A FULL GLASS OF WATER ON AN EMPTY STOMACH  . aspirin EC 81 MG tablet Take 81 mg by mouth daily.  Marland Kitchen atorvastatin (LIPITOR) 10 MG tablet Take 1 tablet (10 mg total) by mouth daily.  . Blood Glucose Monitoring Suppl (ACCU-CHEK AVIVA PLUS) w/Device KIT Test BS BID and prn Dx E11.21  . carvedilol (COREG) 12.5 MG tablet Take 1 tablet (12.5 mg total) by mouth 2 (two) times daily with a meal.  . folic acid (FOLVITE) 1 MG  tablet Take 1 tablet (1 mg total) by mouth daily.  Marland Kitchen gabapentin (NEURONTIN) 400 MG capsule TAKE 1 CAPSULE BY MOUTH AT BEDTIME.  Marland Kitchen glucose blood (ACCU-CHEK AVIVA PLUS) test strip CHECK BLOOD SUGAR 2 TIMES A DAY AND AS NEEDED Dx E11.21  . insulin glargine (LANTUS SOLOSTAR) 100 UNIT/ML Solostar Pen Inject 5-10 Units into the skin at bedtime.  Marland Kitchen leflunomide (ARAVA) 20 MG tablet Take 1 tablet (20 mg total) by mouth daily. Can restart 06/04/19  . Omega-3 Fatty Acids (FISH OIL) 1000 MG CAPS   . OPTIMAL-D 1.25 MG (50000 UT) capsule TAKE 1 CAPSULE BY MOUTH ONCE A WEEK FOR 8 DOSES  . pantoprazole (PROTONIX) 40 MG tablet TAKE 1 TABLET BY MOUTH DAILY.   No facility-administered encounter medications on file as of 09/08/2020.    Patient Active Problem List   Diagnosis Date Noted  . Acute respiratory failure with hypoxia ([name]) 05/24/2019  . COVID-19 virus detected   . Acute metabolic encephalopathy 15/17/6160  . Hypokalemia 05/22/2019  . Hypomagnesemia 05/22/2019  . Elevated troponin 05/22/2019  . Pneumonia due to COVID-19 virus 05/21/2019  . Hyperlipidemia associated with type 2 diabetes mellitus ([name]) 07/07/2018  . Noncompliance with medication regimen 07/07/2018  . Palpitations 05/07/2018  . Chest pain 05/07/2018  . RA (rheumatoid arthritis) ([name]) 03/20/2017  . Mixed hyperlipidemia 02/13/2017  . 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 Personal history of noncompliance with medical treatment, presenting hazards to health 12/24/2016  . Bulging lumbar  disc 08/22/2016  . Syncope 02/10/2012  . Ataxia 02/10/2012  . Uncontrolled type 2 diabetes mellitus with diabetic nephropathy, with long-term current use of insulin ([name]) 02/10/2012    Conditions to be addressed/monitored:HTN, DMII and CKD  Care Plan : RNCM: Diabetes  Updates made by Ilean China, RN since 09/08/2020 12:00 AM    Problem: Diabetes Management   Priority:  High    Long-Range Goal: Monitor and Manage My Blood Sugar-Diabetes Type 2   Start Date: 06/23/2020  This Visit's Progress: On track  Recent Progress: Not on track  Priority: High  Note:   Objective: Lab Results  Component Value Date   HGBA1C 5.9 06/15/2020   HGBA1C 6.1 12/09/2019   HGBA1C 8.3 (H) 03/11/2019   Lab Results  Component Value Date   LDLCALC 71 03/11/2019   CREATININE 2.05 (H) 06/15/2020   Current Barriers:  . Chronic Disease Management support and education needs related to diabetes . Lacks caregiver support.  . Knowledge deficits related to nutrition and diabetes . Unable to independently drive  Nurse Case Manager Clinical Goal(s):  Marland Kitchen Patient will meet with RN Care Manager to address self-management of diabetes . Patient will work with PCP to address needs related to medical management of diabetes . Patient will demonstrate improved self-care management by taking medication as prescribed  Interventions:  . 1:1 collaboration with [name], DO regarding development and update of comprehensive plan of care as evidenced by provider attestation and co-signature . Inter-disciplinary care team collaboration (see longitudinal plan of care) . Evaluation of current treatment plan related to diabetes and patient's adherence to plan as established by provider. . Chart reviewed including relevant office notes and lab results . Reviewed and discussed medications o She is taking 10 units of Lantus at bedtime o Explained that Lantus is a long acting insulin . Discussed diet o Counseling on carb modified diet o Discussed foods typically eaten o Discussed portion sizes  o Recommended that she eat a protein anytime she eats a carbohydrate o Educated on food labels and how to read ingredient lists o Advised to eat meals at regular intervals to avoid hypoglycemia . Discussed professional [name] monitoring o Patient was unable to have sensor placed because  there were none available. Clinical staff unsure if they are going to reorder the [name] will talk with PharmD about reordering sensors when she's back at work after [facility] leave . Discussed home blood sugar monitoring o No readings below 70. Lowest has been in the 80s. o Has readings around 250 at times but not daily. No readings over 250. Marland Kitchen Reinforced that patient should call PCP with any blood sugar readings below 70 or above 300 (per PCP recommendation) . Reviewed upcoming appointment with PCP . Discussed health maintenance o Eye exam is up to date . Discussed family/social support o Daughter helps with transportation to appointments when she can o Son checks in with her regularly and will help with meals . Encouraged patient to reach out to [facility] as needed  Patient Goals/Self-Care Activities Over the next 45 days, patient will: . Check blood sugar before breakfast and before taking Lantus in the evening and any time you feel it is too high or too low . Only take 5-10 units of Lantus at bedtime . Eat at least 3 meals a day and space them out reguarly o Plate method with half plate of non-starchy vegetables, 1/4 plate with a protein, 1/4 plate with a carbohydrate .  Eat a protein anytime you eat a carbohydrate . Read food labels for carbohydrate count and added sugars . Call PCP at [phone] if blood sugar readings are below 70 or above 300 . Reach out to RN Care Manager as needed [phone]    Care Plan : RNCM: Chronic Kidney (Adult)  Updates made by Ilean China, RN since 09/08/2020 12:00 AM    Problem: Disease Progression   Priority: Low    Long-Range Goal: Disease Progression Prevented or Minimized   Start Date: 07/22/2020  This Visit's Progress: On track  Recent Progress: On track  Priority: Low  Note:   Objective: Lab Results  Component Value Date   CREATININE 2.05 (H) 06/15/2020   BUN 34 (H) 06/15/2020   NA 139 06/15/2020   K 4.7 06/15/2020   CL  102 06/15/2020   CO2 23 06/15/2020   Current Barriers:  . Chronic Disease Management support and education needs related to chronic kidney disease in a patient with hypertension and diabetes . Unable to independently drive  Nurse Case Manager Clinical Goal(s):  Marland Kitchen Patient will demonstrate ongoing self health care management ability as evidenced by following up with nephrologist as directed . Patient will work with Nephrologist to address needs related to medical management of CKD  Interventions:  . 1:1 collaboration with [name], DO regarding development and update of comprehensive plan of care as evidenced by provider attestation and co-signature . Inter-disciplinary care team collaboration (see longitudinal plan of care) . Evaluation of current treatment plan related to CKD and patient's adherence to plan as established by provider. . Reviewed relevant office notes and lab results including nephrology note from last visit in 03/2020 . Discussed need for f/u with nephrology in 09/2020 . Reviewed and discussed medications . Discussed impact of diabetes and hypertension on kidney function . Previously talked about whether patient has any fluid or protein restrictions o Patient isn't aware of any o Recent office notes did not indicate any specific dietary restrictions . Encouraged patient to take medications as prescribed and to keep blood sugar and blood pressure under control to minimize impact on kidney function . Provided with RN Care Manager telephone number and encouraged to reach out as needed  Patient Goals/Self-Care Activities Over the next 180 days, patient will: . Take all medication as prescribed . Call nephrologist or PCP if you have any questions or concerns . Keep all follow-up appointments with nephrologist and PCP . Manage blood pressure and blood sugar by checking them regularly and taking your medication . Call PCP if you blood pressure or blood sugar is  running higher than recommended  . Call RN Care Manager as needed [phone]     Follow Up Plan:  . Telephone follow up appointment with care management team member scheduled for:10/10/20 with RN Care Manager . The patient has been provided with contact information for the care management team and has been advised to call with any health related questions or concerns.  . Next PCP appointment scheduled for: 10/14/20 with Dr Lajuana Ripple  Chong Sicilian, BSN, RN-BC [place] [facility] Direct Dial: [phone]

## 2020-09-12 ENCOUNTER — Telehealth: Payer: Medicare Other

## 2020-09-15 ENCOUNTER — Ambulatory Visit: Payer: Medicare Other | Admitting: Licensed Clinical Social Worker

## 2020-09-15 DIAGNOSIS — M069 Rheumatoid arthritis, unspecified: Secondary | ICD-10-CM | POA: Diagnosis not present

## 2020-09-15 DIAGNOSIS — I129 Hypertensive chronic kidney disease with stage 1 through stage 4 chronic kidney disease, or unspecified chronic kidney disease: Secondary | ICD-10-CM

## 2020-09-15 DIAGNOSIS — N183 Chronic kidney disease, stage 3 unspecified: Secondary | ICD-10-CM

## 2020-09-15 DIAGNOSIS — E782 Mixed hyperlipidemia: Secondary | ICD-10-CM

## 2020-09-15 DIAGNOSIS — R270 Ataxia, unspecified: Secondary | ICD-10-CM

## 2020-09-15 DIAGNOSIS — E1121 Type 2 diabetes mellitus with diabetic nephropathy: Secondary | ICD-10-CM

## 2020-09-15 DIAGNOSIS — E1122 Type 2 diabetes mellitus with diabetic chronic kidney disease: Secondary | ICD-10-CM | POA: Diagnosis not present

## 2020-09-15 DIAGNOSIS — E1165 Type 2 diabetes mellitus with hyperglycemia: Secondary | ICD-10-CM

## 2020-09-15 DIAGNOSIS — IMO0002 Reserved for concepts with insufficient information to code with codable children: Secondary | ICD-10-CM

## 2020-09-15 DIAGNOSIS — Z794 Long term (current) use of insulin: Secondary | ICD-10-CM

## 2020-09-15 NOTE — Chronic Care Management (AMB) (Signed)
Chronic Care Management    Clinical Social Work Note  09/15/2020 Name: Erica Crosby MRN: [id number] DOB: 1945-07-28  Erica Crosby is a 75 y.o. year old female who is a primary care patient of [name], DO. The CCM team was consulted to assist the patient with chronic disease management and/or care coordination needs related to: [name] .   Engaged with patient by telephone for follow up visit in response to provider referral for social work chronic care management and care coordination services.   Consent to Services:  The patient was given information about Chronic Care Management services, agreed to services, and gave verbal consent prior to initiation of services.  Please see initial visit note for detailed documentation.   Patient agreed to services and consent obtained.   Assessment: Review of patient past medical history, allergies, medications, and health status, including review of relevant consultants reports was performed today as part of a comprehensive evaluation and provision of chronic care management and care coordination services.     SDOH (Social Determinants of Health) assessments and interventions performed:    Advanced Directives Status: See Vyn[employer] application for related entries.  CCM Care Plan  No Known Allergies  Outpatient Encounter Medications as of 09/15/2020  Medication Sig  . Accu-Chek Softclix Lancets lancets CHECK BLOOD SUGAR TWICE A DAY AND AS NEEDED Dx E11.21  . Adalimumab (HUMIRA PEN) 40 MG/0.4ML PNKT Inject 40 mg into the skin every 14 (fourteen) days. Can restart 06/04/19  . alendronate (FOSAMAX) 70 MG tablet TAKE 1 TABLET ONCE A WEEK. TAKE WITH A FULL GLASS OF WATER ON AN EMPTY STOMACH  . aspirin EC 81 MG tablet Take 81 mg by mouth daily.  Marland Kitchen atorvastatin (LIPITOR) 10 MG tablet Take 1 tablet (10 mg total) by mouth daily.  . Blood Glucose Monitoring Suppl (ACCU-CHEK AVIVA PLUS) w/Device KIT Test BS BID and prn Dx E11.21  .  carvedilol (COREG) 12.5 MG tablet Take 1 tablet (12.5 mg total) by mouth 2 (two) times daily with a meal.  . folic acid (FOLVITE) 1 MG tablet Take 1 tablet (1 mg total) by mouth daily.  Marland Kitchen gabapentin (NEURONTIN) 400 MG capsule TAKE 1 CAPSULE BY MOUTH AT BEDTIME.  Marland Kitchen glucose blood (ACCU-CHEK AVIVA PLUS) test strip CHECK BLOOD SUGAR 2 TIMES A DAY AND AS NEEDED Dx E11.21  . insulin glargine (LANTUS SOLOSTAR) 100 UNIT/ML Solostar Pen Inject 5-10 Units into the skin at bedtime.  Marland Kitchen leflunomide (ARAVA) 20 MG tablet Take 1 tablet (20 mg total) by mouth daily. Can restart 06/04/19  . Omega-3 Fatty Acids (FISH OIL) 1000 MG CAPS   . OPTIMAL-D 1.25 MG (50000 UT) capsule TAKE 1 CAPSULE BY MOUTH ONCE A WEEK FOR 8 DOSES  . pantoprazole (PROTONIX) 40 MG tablet TAKE 1 TABLET BY MOUTH DAILY.   No facility-administered encounter medications on file as of 09/15/2020.    Patient Active Problem List   Diagnosis Date Noted  . Acute respiratory failure with hypoxia ([place]) 05/24/2019  . COVID-19 virus detected   . Acute metabolic encephalopathy 77/82/4235  . Hypokalemia 05/22/2019  . Hypomagnesemia 05/22/2019  . Elevated troponin 05/22/2019  . Pneumonia due to COVID-19 virus 05/21/2019  . Hyperlipidemia associated with type 2 diabetes mellitus ([place]) 07/07/2018  . Noncompliance with medication regimen 07/07/2018  . Palpitations 05/07/2018  . Chest pain 05/07/2018  . RA (rheumatoid arthritis) ([place]) 03/20/2017  . Mixed hyperlipidemia 02/13/2017  . Hypertension associated with stage 3 chronic kidney disease due to type 2  diabetes mellitus ([place]) 12/24/2016  . Personal history of noncompliance with medical treatment, presenting hazards to health 12/24/2016  . Bulging lumbar disc 08/22/2016  . Syncope 02/10/2012  . Ataxia 02/10/2012  . Uncontrolled type 2 diabetes mellitus with diabetic nephropathy, with long-term current use of insulin ([place]) 02/10/2012    Conditions to be addressed/monitored: Monitor client  completion of ADLs   Care Plan : LCSW Care Plan  Updates made by Katha Cabal, LCSW since 09/15/2020 12:00 AM    Problem: Coping Skills (General Plan of Care)     Goal: Coping Skills Enhanced   Start Date: 09/15/2020  Expected End Date: 12/15/2020  This Visit's Progress: On track  Priority: Medium  Note:   Current barriers:   . Patient in need of assistance with connecting to community resources for possible help in completing her daily ADLs and other daily tasks . Patient is unable to independently navigate community resource options without care coordination support . Some mobility issues . Vision issues  Clinical Goals:   LCSW to talk with client in next 30 days about client completion of ADLs and client completion of daily activities  Clinical Interventions:  . Collaboration with [name], DO regarding development and update of comprehensive plan of care as evidenced by provider attestation and co-signature . Assessment of needs, barriers ,of client  . Talked with client about pain issues faced . Talked with client about vision of client (client is using prescribed eye drops) . Talked with client about sleeping issues of client . Talked with client about mobility of client . Talked with client about family support (son calls) . Talked with client about [facility] transport help . Talked with client about medication procurement . Talked with client about balance of client . Talked with client about her upcoming medical appointments . Talked with client about ADLs completion of client . Talked with client about her management of Diabetes  Patient Strengths: Has support from her daughter Client attends scheduled medical appointments Takes medications as prescribed  Patient Deficits: Occasional help needed with completing ADLs Some mobility issues  Patient Goals:  Patient will attend scheduled medical appointments in next 30 days Patient will communicate with  RNCM or LCSW as needed in nest 30 days for CCm support Patient will communicate regularly with her daughter in next 30 days to discuss the needs of client -  Follow Up Plan: LCSW to call client or her daughter on 10/20/20     [name].Ashia Dehner MSW, LCSW Licensed Clinical Social Worker [facility] Care Management [phone]

## 2020-09-15 NOTE — Patient Instructions (Signed)
Visit Information  PATIENT GOALS: Goals Addressed            This Visit's Progress   . Complete ADLs and other daily activities       Timeframe:  Short-Term Goal Priority:  Medium Progress:  On Track Start Date:              09/15/20               Expected End Date:         12/15/20              Follow Up Date 10/20/20    [name] (Patient) Complete ADLs and other daily activities     Why is this important?    Screening tests can find diseases early when they are easier to treat.   Your doctor or nurse will talk with you about which tests are important for you.   Getting shots for common diseases like the flu and shingles will help prevent them.     Patient Strengths: Has support from her daughter Client attends scheduled medical appointments Takes medications as prescribed  Patient Deficits: Occasional help needed with completing ADLs Some mobility issues  Patient Goals:  Patient will attend scheduled medical appointments in next 30 days Patient will communicate with RNCM or LCSW as needed in nest 30 days for CCm support Patient will communicate regularly with her daughter in next 30 days to discuss the needs of client -  Follow Up Plan: LCSW to call client or her daughter on 10/20/20       [name].Jenson Beedle MSW, LCSW Licensed Clinical Social Worker [facility] Care Management [phone]

## 2020-09-21 DIAGNOSIS — N179 Acute kidney failure, unspecified: Secondary | ICD-10-CM | POA: Diagnosis not present

## 2020-09-21 DIAGNOSIS — E1122 Type 2 diabetes mellitus with diabetic chronic kidney disease: Secondary | ICD-10-CM | POA: Diagnosis not present

## 2020-09-21 DIAGNOSIS — M069 Rheumatoid arthritis, unspecified: Secondary | ICD-10-CM | POA: Diagnosis not present

## 2020-09-21 DIAGNOSIS — R634 Abnormal weight loss: Secondary | ICD-10-CM | POA: Diagnosis not present

## 2020-09-21 DIAGNOSIS — I129 Hypertensive chronic kidney disease with stage 1 through stage 4 chronic kidney disease, or unspecified chronic kidney disease: Secondary | ICD-10-CM | POA: Diagnosis not present

## 2020-09-21 DIAGNOSIS — N183 Chronic kidney disease, stage 3 unspecified: Secondary | ICD-10-CM | POA: Diagnosis not present

## 2020-09-27 DIAGNOSIS — N390 Urinary tract infection, site not specified: Secondary | ICD-10-CM | POA: Diagnosis not present

## 2020-09-27 DIAGNOSIS — N179 Acute kidney failure, unspecified: Secondary | ICD-10-CM | POA: Diagnosis not present

## 2020-09-28 ENCOUNTER — Other Ambulatory Visit: Payer: Self-pay | Admitting: Family Medicine

## 2020-10-10 ENCOUNTER — Telehealth: Payer: Medicare Other | Admitting: *Deleted

## 2020-10-14 ENCOUNTER — Other Ambulatory Visit: Payer: Self-pay

## 2020-10-14 ENCOUNTER — Encounter: Payer: Self-pay | Admitting: Family Medicine

## 2020-10-14 ENCOUNTER — Telehealth: Payer: Self-pay | Admitting: Pharmacist

## 2020-10-14 ENCOUNTER — Ambulatory Visit (INDEPENDENT_AMBULATORY_CARE_PROVIDER_SITE_OTHER): Payer: Medicare Other | Admitting: Family Medicine

## 2020-10-14 VITALS — BP 135/69 | HR 78 | Temp 98.4°F | Ht 67.0 in | Wt 131.8 lb

## 2020-10-14 DIAGNOSIS — Z794 Long term (current) use of insulin: Secondary | ICD-10-CM | POA: Diagnosis not present

## 2020-10-14 DIAGNOSIS — N184 Chronic kidney disease, stage 4 (severe): Secondary | ICD-10-CM

## 2020-10-14 DIAGNOSIS — Z789 Other specified health status: Secondary | ICD-10-CM

## 2020-10-14 DIAGNOSIS — E1165 Type 2 diabetes mellitus with hyperglycemia: Secondary | ICD-10-CM | POA: Diagnosis not present

## 2020-10-14 DIAGNOSIS — E1122 Type 2 diabetes mellitus with diabetic chronic kidney disease: Secondary | ICD-10-CM | POA: Diagnosis not present

## 2020-10-14 DIAGNOSIS — I129 Hypertensive chronic kidney disease with stage 1 through stage 4 chronic kidney disease, or unspecified chronic kidney disease: Secondary | ICD-10-CM | POA: Diagnosis not present

## 2020-10-14 DIAGNOSIS — Z7409 Other reduced mobility: Secondary | ICD-10-CM

## 2020-10-14 DIAGNOSIS — E1121 Type 2 diabetes mellitus with diabetic nephropathy: Secondary | ICD-10-CM | POA: Diagnosis not present

## 2020-10-14 LAB — BAYER DCA HB A1C WAIVED: HB A1C (BAYER DCA - WAIVED): 6 % (ref <7.0)

## 2020-10-14 NOTE — Progress Notes (Signed)
Subjective: CC: Diabetes PCP: [name], DO HPI:Erica Crosby is a 75 y.o. female presenting to clinic today for:  1. Type 2 Diabetes with hypertension, hyperlipidemia, CKD:  Patient continues to follow-up with Dr. Vanetta Mulders regularly for renal monitoring.  Her next appointment is next month. She is compliant with her Lantus but notes that she injects anywhere from 10 to 15 units daily depending on her blood sugars.  If they are over 300 she is on the latter end of the spectrum of injection.  Denies any hypoglycemic episodes.  She is compliant with her Lipitor, Coreg and Crestor  Last eye exam: Going to schedule Last foot exam: Up-to-date Last A1c:  Lab Results  Component Value Date   HGBA1C 6.0 10/14/2020   Nephropathy screen indicated?:  Has renal disease Last flu, zoster and/or pneumovax:  Immunization History  Administered Date(s) Administered  . Influenza, High Dose Seasonal PF 03/11/2018, 02/19/2019  . Influenza,inj,Quad PF,6+ Mos 03/18/2016, 03/20/2017  . Moderna Sars-Covid-2 Vaccination 07/24/2019, 08/21/2019, 05/11/2020  . Pneumococcal Conjugate-13 04/21/2016  . Pneumococcal Polysaccharide-23 08/14/2017, 01/03/2018  . Zoster Recombinat (Shingrix) 01/03/2018, 03/11/2018    ROS: Per HPI  No Known Allergies Past Medical History:  Diagnosis Date  . Anemia   . Arthritis   . Cataract   . Coronary artery disease    Mild plaque 2012  . Diabetes mellitus   . Essential hypertension 02/10/2012  . GERD (gastroesophageal reflux disease)   . Hyperlipidemia   . Hypertension   . Neuropathy   . Peptic ulcer   . Rheumatoid arthritis ([place])   . TIA (transient ischemic attack) 02/10/2012    Current Outpatient Medications:  .  Accu-Chek Softclix Lancets lancets, CHECK BLOOD SUGAR TWICE A DAY AND AS NEEDED Dx E11.21, Disp: 200 each, Rfl: 3 .  Adalimumab (HUMIRA PEN) 40 MG/0.4ML PNKT, Inject 40 mg into the skin every 14 (fourteen) days. Can restart 06/04/19,  Disp: 2 each, Rfl:  .  alendronate (FOSAMAX) 70 MG tablet, TAKE 1 TABLET ONCE A WEEK. TAKE WITH A FULL GLASS OF WATER ON AN EMPTY STOMACH, Disp: 12 tablet, Rfl: 0 .  aspirin EC 81 MG tablet, Take 81 mg by mouth daily., Disp: , Rfl:  .  atorvastatin (LIPITOR) 10 MG tablet, Take 1 tablet (10 mg total) by mouth daily., Disp: 90 tablet, Rfl: 3 .  Blood Glucose Monitoring Suppl (ACCU-CHEK AVIVA PLUS) w/Device KIT, Test BS BID and prn Dx E11.21, Disp: 1 kit, Rfl: 0 .  carvedilol (COREG) 12.5 MG tablet, Take 1 tablet (12.5 mg total) by mouth 2 (two) times daily with a meal., Disp: 180 tablet, Rfl: 3 .  folic acid (FOLVITE) 1 MG tablet, Take 1 tablet (1 mg total) by mouth daily., Disp: 90 tablet, Rfl: 3 .  gabapentin (NEURONTIN) 400 MG capsule, TAKE 1 CAPSULE BY MOUTH AT BEDTIME., Disp: 90 capsule, Rfl: 0 .  glucose blood (ACCU-CHEK AVIVA PLUS) test strip, CHECK BLOOD SUGAR 2 TIMES A DAY AND AS NEEDED Dx E11.21, Disp: 200 strip, Rfl: 3 .  insulin glargine (LANTUS SOLOSTAR) 100 UNIT/ML Solostar Pen, Inject 5-10 Units into the skin at bedtime., Disp: 15 mL, Rfl: 2 .  leflunomide (ARAVA) 20 MG tablet, Take 1 tablet (20 mg total) by mouth daily. Can restart 06/04/19, Disp:  , Rfl:  .  Omega-3 Fatty Acids (FISH OIL) 1000 MG CAPS, , Disp: , Rfl:  .  OPTIMAL-D 1.25 MG (50000 UT) capsule, TAKE 1 CAPSULE BY MOUTH ONCE A WEEK FOR 8  DOSES, Disp: 8 capsule, Rfl: 0 .  pantoprazole (PROTONIX) 40 MG tablet, TAKE 1 TABLET BY MOUTH DAILY., Disp: 90 tablet, Rfl: 0 Social History   Socioeconomic History  . Marital status: Widowed    Spouse name: Not on file  . Number of children: 3  . Years of education: 70  . Highest education level: High school graduate  Occupational History  . Occupation: retired  Tobacco Use  . Smoking status: Former Smoker    Years: 25.00    Types: Cigarettes, E-cigarettes    Quit date: 02/02/2013    Years since quitting: 7.7  . Smokeless tobacco: Never Used  Vaping Use  . Vaping Use: Never  used  Substance and Sexual Activity  . Alcohol use: No  . Drug use: No  . Sexual activity: Not Currently    Birth control/protection: Post-menopausal  Other Topics Concern  . Not on file  Social History Narrative   Daughter stays with her.    Social Determinants of Health   Financial Resource Strain: Low Risk   . Difficulty of Paying Living Expenses: Not very hard  Food Insecurity: No Food Insecurity  . Worried About Charity fundraiser in the Last Year: Never true  . Ran Out of Food in the Last Year: Never true  Transportation Needs: No Transportation Needs  . Lack of Transportation (Medical): No  . Lack of Transportation (Non-Medical): No  Physical Activity: Inactive  . Days of Exercise per Week: 0 days  . Minutes of Exercise per Session: 0 min  Stress: No Stress Concern Present  . Feeling of Stress : Only a little  Social Connections: Moderately Isolated  . Frequency of Communication with Friends and Family: Three times a week  . Frequency of Social Gatherings with Friends and Family: Twice a week  . Attends Religious Services: More than 4 times per year  . Active Member of Clubs or Organizations: No  . Attends Archivist Meetings: Never  . Marital Status: Widowed  Intimate Partner Violence: Not on file   Family History  Problem Relation Age of Onset  . Heart disease Sister        [place] (died age 58) with "enlarged heart"  . CAD Sister   . Diabetes Mother   . Heart disease Mother        Later onset heart disease   . Dementia Mother   . Alcohol abuse Father   . Liver disease Father   . CAD Sister 60       CABG  . Early death Sister   . Diabetes Brother   . Kidney disease Brother        related to DM  . Diabetes Brother     Objective: Office vital signs reviewed. BP 135/69   Pulse 78   Temp 98.4 F (36.9 C)   Ht '5\' 7"'  (1.702 m)   Wt 131 lb 12.8 oz (59.8 kg)   SpO2 96%   BMI 20.64 kg/m   Physical Examination:  General: Awake, alert,  thin frail appearing female, No acute distress HEENT: Normal; sclera white Cardio: regular rate and rhythm, S1S2 heard, no murmurs appreciated Pulm: clear to auscultation bilaterally, no wheezes, rhonchi or rales; normal work of breathing on room air  Assessment/ Plan: 75 y.o. female   Controlled type 2 diabetes mellitus with stage 4 chronic kidney disease, with long-term current use of insulin ([place]) - Plan: CMP14+EGFR, Bayer DCA Hb A1c Waived, Lipid panel, Microalbumin / creatinine urine ratio  Hypertension associated with stage 4 chronic kidney disease due to type 2 diabetes mellitus (HCC)  Impaired mobility and ADLs  Sugar is at goal with A1c of 6.0.  I have highly recommended against use of the Fosamax.  Not sure how this continues to get refilled but the pharmacy has been called and we have asked him to stop requesting refills.  Patient should be getting treated with Prolia which is safe in the setting of renal disease.  Her blood pressure is at goal.  No changes needed  We will see if we can get her CGM.  I think that her blood sugars probably a little too tightly controlled given age and renal disease.  She will follow-up with Almyra Free soon about this.  Orders Placed This Encounter  Procedures  . CMP14+EGFR  . Bayer DCA Hb A1c Waived  . Lipid panel  . Microalbumin / creatinine urine ratio   No orders of the defined types were placed in this encounter.    [name], DO [place] [phone]

## 2020-10-14 NOTE — Patient Instructions (Signed)
NO MORE ALENDRONATE! Your kidneys don't work well enough to use this medication!!!

## 2020-10-14 NOTE — Telephone Encounter (Signed)
Libre 2 CGM sent to [facility] via [facility] portal CGM will deliver to practice if approved per patient request

## 2020-10-15 LAB — MICROALBUMIN / CREATININE URINE RATIO
Creatinine, Urine: 117.6 mg/dL
Microalb/Creat Ratio: 1239 mg/g creat — ABNORMAL HIGH (ref 0–29)
Microalbumin, Urine: 1457.2 ug/mL

## 2020-10-15 LAB — CMP14+EGFR
ALT: 9 IU/L (ref 0–32)
AST: 15 IU/L (ref 0–40)
Albumin/Globulin Ratio: 1.2 (ref 1.2–2.2)
Albumin: 3.9 g/dL (ref 3.7–4.7)
Alkaline Phosphatase: 111 IU/L (ref 44–121)
BUN/Creatinine Ratio: 14 (ref 12–28)
BUN: 34 mg/dL — ABNORMAL HIGH (ref 8–27)
Bilirubin Total: 0.3 mg/dL (ref 0.0–1.2)
CO2: 22 mmol/L (ref 20–29)
Calcium: 9.1 mg/dL (ref 8.7–10.3)
Chloride: 105 mmol/L (ref 96–106)
Creatinine, Ser: 2.41 mg/dL — ABNORMAL HIGH (ref 0.57–1.00)
Globulin, Total: 3.2 g/dL (ref 1.5–4.5)
Glucose: 161 mg/dL — ABNORMAL HIGH (ref 65–99)
Potassium: 4.6 mmol/L (ref 3.5–5.2)
Sodium: 140 mmol/L (ref 134–144)
Total Protein: 7.1 g/dL (ref 6.0–8.5)
eGFR: 21 mL/min/{1.73_m2} — ABNORMAL LOW (ref >59)

## 2020-10-15 LAB — LIPID PANEL
Chol/HDL Ratio: 3 ratio (ref 0.0–4.4)
Cholesterol, Total: 141 mg/dL (ref 100–199)
HDL: 47 mg/dL (ref >39)
LDL Chol Calc (NIH): 77 mg/dL (ref 0–99)
Triglycerides: 87 mg/dL (ref 0–149)
VLDL Cholesterol Cal: 17 mg/dL (ref 5–40)

## 2020-10-16 DIAGNOSIS — H04123 Dry eye syndrome of bilateral lacrimal glands: Secondary | ICD-10-CM | POA: Diagnosis not present

## 2020-10-16 DIAGNOSIS — H40033 Anatomical narrow angle, bilateral: Secondary | ICD-10-CM | POA: Diagnosis not present

## 2020-10-20 ENCOUNTER — Ambulatory Visit (INDEPENDENT_AMBULATORY_CARE_PROVIDER_SITE_OTHER): Payer: Medicare Other | Admitting: Licensed Clinical Social Worker

## 2020-10-20 DIAGNOSIS — I129 Hypertensive chronic kidney disease with stage 1 through stage 4 chronic kidney disease, or unspecified chronic kidney disease: Secondary | ICD-10-CM

## 2020-10-20 DIAGNOSIS — N184 Chronic kidney disease, stage 4 (severe): Secondary | ICD-10-CM | POA: Diagnosis not present

## 2020-10-20 DIAGNOSIS — M069 Rheumatoid arthritis, unspecified: Secondary | ICD-10-CM

## 2020-10-20 DIAGNOSIS — Z794 Long term (current) use of insulin: Secondary | ICD-10-CM

## 2020-10-20 DIAGNOSIS — E1122 Type 2 diabetes mellitus with diabetic chronic kidney disease: Secondary | ICD-10-CM

## 2020-10-20 DIAGNOSIS — E1121 Type 2 diabetes mellitus with diabetic nephropathy: Secondary | ICD-10-CM

## 2020-10-20 DIAGNOSIS — E1165 Type 2 diabetes mellitus with hyperglycemia: Secondary | ICD-10-CM

## 2020-10-20 DIAGNOSIS — E782 Mixed hyperlipidemia: Secondary | ICD-10-CM | POA: Diagnosis not present

## 2020-10-20 DIAGNOSIS — IMO0002 Reserved for concepts with insufficient information to code with codable children: Secondary | ICD-10-CM

## 2020-10-20 DIAGNOSIS — R270 Ataxia, unspecified: Secondary | ICD-10-CM

## 2020-10-20 NOTE — Chronic Care Management (AMB) (Signed)
Chronic Care Management    Clinical Social Work Note  10/20/2020 Name: Erica Crosby MRN: [id number] DOB: August 19, 1945  Erica Crosby is a 75 y.o. year old female who is a primary care patient of [name], DO. The CCM team was consulted to assist the patient with chronic disease management and/or care coordination needs related to: [name] .   Engaged with patient Erica Crosby telephone for follow up visit in response to provider referral for social work chronic care management and care coordination services.   Consent to Services:  The patient was given information about Chronic Care Management services, agreed to services, and gave verbal consent prior to initiation of services.  Please see initial visit note for detailed documentation.   Patient agreed to services and consent obtained.   Assessment: Review of patient past medical history, allergies, medications, and health status, including review of relevant consultants reports was performed today as part of a comprehensive evaluation and provision of chronic care management and care coordination services.     SDOH (Social Determinants of Health) assessments and interventions performed:  SDOH Interventions   Flowsheet Row Most Recent Value  SDOH Interventions   Depression Interventions/Treatment  --  [informed client of LCSW support and of RNCM support]       Advanced Directives Status: See Vynca application for related entries.  CCM Care Plan  No Known Allergies  Outpatient Encounter Medications as of 10/20/2020  Medication Sig  . Accu-Chek Softclix Lancets lancets CHECK BLOOD SUGAR TWICE A DAY AND AS NEEDED Dx E11.21  . Adalimumab (HUMIRA PEN) 40 MG/0.4ML PNKT Inject 40 mg into the skin every 14 (fourteen) days. Can restart 06/04/19  . aspirin EC 81 MG tablet Take 81 mg by mouth daily.  Marland Kitchen atorvastatin (LIPITOR) 10 MG tablet Take 1 tablet (10 mg total) by mouth daily.  . Blood Glucose  Monitoring Suppl (ACCU-CHEK AVIVA PLUS) w/Device KIT Test BS BID and prn Dx E11.21  . carvedilol (COREG) 12.5 MG tablet Take 1 tablet (12.5 mg total) by mouth 2 (two) times daily with a meal.  . folic acid (FOLVITE) 1 MG tablet Take 1 tablet (1 mg total) by mouth daily.  Marland Kitchen gabapentin (NEURONTIN) 400 MG capsule TAKE 1 CAPSULE BY MOUTH AT BEDTIME.  Marland Kitchen glucose blood (ACCU-CHEK AVIVA PLUS) test strip CHECK BLOOD SUGAR 2 TIMES A DAY AND AS NEEDED Dx E11.21  . insulin glargine (LANTUS SOLOSTAR) 100 UNIT/ML Solostar Pen Inject 5-10 Units into the skin at bedtime.  Marland Kitchen leflunomide (ARAVA) 20 MG tablet Take 1 tablet (20 mg total) by mouth daily. Can restart 06/04/19  . Omega-3 Fatty Acids (FISH OIL) 1000 MG CAPS   . OPTIMAL-D 1.25 MG (50000 UT) capsule TAKE 1 CAPSULE BY MOUTH ONCE A WEEK FOR 8 DOSES  . pantoprazole (PROTONIX) 40 MG tablet TAKE 1 TABLET BY MOUTH DAILY.   No facility-administered encounter medications on file as of 10/20/2020.    Patient Active Problem List   Diagnosis Date Noted  . Acute respiratory failure with hypoxia ([place]) 05/24/2019  . COVID-19 virus detected   . Acute metabolic encephalopathy 18/56/3149  . Hypokalemia 05/22/2019  . Hypomagnesemia 05/22/2019  . Elevated troponin 05/22/2019  . Pneumonia due to COVID-19 virus 05/21/2019  . Hyperlipidemia associated with type 2 diabetes mellitus ([place]) 07/07/2018  . Noncompliance with medication regimen 07/07/2018  . Palpitations 05/07/2018  . Chest pain 05/07/2018  . RA (rheumatoid arthritis) ([place]) 03/20/2017  . Mixed hyperlipidemia 02/13/2017  . Hypertension associated  with stage 3 chronic kidney disease due to type 2 diabetes mellitus ([place]) 12/24/2016  . Personal history of noncompliance with medical treatment, presenting hazards to health 12/24/2016  . Bulging lumbar disc 08/22/2016  . Syncope 02/10/2012  . Ataxia 02/10/2012  . Uncontrolled type 2 diabetes mellitus with diabetic nephropathy, with long-term current use of  insulin ([place]) 02/10/2012    Conditions to be addressed/monitored: Monitor client completion of ADLs and other daily activities   Care Plan : [place]  Updates made by Katha Cabal, LCSW since 10/20/2020 12:00 AM    Problem: Coping Skills (General Plan of Care)     Goal: Coping Skills Enhanced'Manage ADLS daily as able   Start Date: 09/15/2020  Expected End Date: 12/15/2020  This Visit's Progress: On track  Recent Progress: On track  Priority: Medium  Note:   Current barriers:   . Patient in need of assistance with connecting to community resources for possible help in completing her daily ADLs and other daily tasks . Patient is unable to independently navigate community resource options without care coordination support . Some mobility issues . Vision issues  Clinical Goals:   LCSW to talk with client in next 30 days about client completion of ADLs and client completion of daily activities  Clinical Interventions:  . Collaboration with [name], DO regarding development and update of comprehensive plan of care as evidenced by provider attestation and co-signature . Assessment of needs, barriers ,of client  . Talked with Erica Crosby, daughter of client about pain issues faced by client . Talked with Erica Crosby about vision of client (client is using prescribed eye drops) . Talked with Erica Crosby about sleeping issues of client . Talked with Erica Crosby about family support for client (daughter is supportive, son is supportive)  Patient Strengths: Has support from her daughter Client attends scheduled medical appointments Takes medications as prescribed  Patient Deficits: Occasional help needed with completing ADLs Some mobility issues  Patient Goals:  Patient will attend scheduled medical appointments in next 30 days Patient will communicate with RNCM or LCSW as needed in nest 30 days for CCm support Patient will communicate regularly with her daughter in next 30  days to discuss the needs of client -  Follow Up Plan: LCSW to call client or her daughter on 11/29/20       [name].Gizel Riedlinger MSW, LCSW Licensed Clinical Social Worker [facility] Care Management [phone]

## 2020-10-20 NOTE — Patient Instructions (Signed)
Visit Information  PATIENT GOALS: Goals Addressed            This Visit's Progress   . Complete ADLs and other daily activities       Timeframe:  Short-Term Goal Priority:  Medium Progress:  On Track Start Date:              09/15/20               Expected End Date:         12/15/20              Follow Up Date  11/29/20    [name] (Patient) Complete ADLs and other daily activities     Why is this important?    Screening tests can find diseases early when they are easier to treat.   Your doctor or nurse will talk with you about which tests are important for you.   Getting shots for common diseases like the flu and shingles will help prevent them.     Patient Strengths: Has support from her daughter Client attends scheduled medical appointments Takes medications as prescribed  Patient Deficits: Occasional help needed with completing ADLs Some mobility issues  Patient Goals:  Patient will attend scheduled medical appointments in next 30 days Patient will communicate with RNCM or LCSW as needed in nest 30 days for CCm support Patient will communicate regularly with her daughter in next 30 days to discuss the needs of client -  Follow Up Plan: LCSW to call client or her daughter on 11/29/20       [name].Ethan Clayburn MSW, LCSW Licensed Clinical Social Worker [facility] Care Management [phone]

## 2020-10-26 ENCOUNTER — Telehealth: Payer: Medicare Other | Admitting: *Deleted

## 2020-11-08 ENCOUNTER — Other Ambulatory Visit: Payer: Self-pay | Admitting: Family Medicine

## 2020-11-08 DIAGNOSIS — R634 Abnormal weight loss: Secondary | ICD-10-CM | POA: Diagnosis not present

## 2020-11-08 DIAGNOSIS — E1165 Type 2 diabetes mellitus with hyperglycemia: Secondary | ICD-10-CM | POA: Diagnosis not present

## 2020-11-08 DIAGNOSIS — E1122 Type 2 diabetes mellitus with diabetic chronic kidney disease: Secondary | ICD-10-CM | POA: Diagnosis not present

## 2020-11-08 DIAGNOSIS — M069 Rheumatoid arthritis, unspecified: Secondary | ICD-10-CM | POA: Diagnosis not present

## 2020-11-08 DIAGNOSIS — N189 Chronic kidney disease, unspecified: Secondary | ICD-10-CM | POA: Diagnosis not present

## 2020-11-08 DIAGNOSIS — I129 Hypertensive chronic kidney disease with stage 1 through stage 4 chronic kidney disease, or unspecified chronic kidney disease: Secondary | ICD-10-CM | POA: Diagnosis not present

## 2020-11-08 DIAGNOSIS — N390 Urinary tract infection, site not specified: Secondary | ICD-10-CM | POA: Diagnosis not present

## 2020-11-08 DIAGNOSIS — N183 Chronic kidney disease, stage 3 unspecified: Secondary | ICD-10-CM | POA: Diagnosis not present

## 2020-11-08 DIAGNOSIS — N179 Acute kidney failure, unspecified: Secondary | ICD-10-CM | POA: Diagnosis not present

## 2020-11-16 ENCOUNTER — Telehealth: Payer: Self-pay | Admitting: Family Medicine

## 2020-11-16 ENCOUNTER — Other Ambulatory Visit: Payer: Self-pay

## 2020-11-16 ENCOUNTER — Ambulatory Visit (INDEPENDENT_AMBULATORY_CARE_PROVIDER_SITE_OTHER): Payer: Medicare Other | Admitting: Pharmacist

## 2020-11-16 DIAGNOSIS — E1165 Type 2 diabetes mellitus with hyperglycemia: Secondary | ICD-10-CM | POA: Diagnosis not present

## 2020-11-16 DIAGNOSIS — Z794 Long term (current) use of insulin: Secondary | ICD-10-CM | POA: Diagnosis not present

## 2020-11-16 DIAGNOSIS — E1121 Type 2 diabetes mellitus with diabetic nephropathy: Secondary | ICD-10-CM | POA: Diagnosis not present

## 2020-11-16 DIAGNOSIS — IMO0002 Reserved for concepts with insufficient information to code with codable children: Secondary | ICD-10-CM

## 2020-11-16 NOTE — Telephone Encounter (Signed)
Pt called stating she received her libre in the mail and has questions about it.  Please call patient. [phone]

## 2020-11-16 NOTE — Progress Notes (Signed)
    11/16/2020 Name: Erica Crosby MRN: [id number] DOB: 06-10-1946   S:  66 yoF Presents for CGM/libre placement.  Patient was referred and last seen by Primary Care Provider on 10/14/20.  Insurance coverage/medication affordability: UHC medicare O:  Lab Results  Component Value Date   HGBA1C 6.0 10/14/2020     Lipid Panel     Component Value Date/Time   CHOL 141 10/14/2020 0950   TRIG 87 10/14/2020 0950   HDL 47 10/14/2020 0950   CHOLHDL 3.0 10/14/2020 0950   CHOLHDL 3.3 02/10/2012 0520   VLDL 17 02/10/2012 0520   LDLCALC 77 10/14/2020 0950    A/P:  -Demonstrated how to use Libre 2 CGM system -Libre 2 sensor was placed on patient's left arm, tegaderm used to secure -Patient was able to use teach back to scan arm for blood sugar reading -Encouraged patient to call if questions arise -CGM was shipped via [employer] (using [employer] portal)   Regina Eck, PharmD, BCPS Clinical Pharmacist, [place]  [facility] Phone [phone]

## 2020-11-29 ENCOUNTER — Telehealth: Payer: Medicare Other

## 2020-11-30 ENCOUNTER — Other Ambulatory Visit: Payer: Self-pay

## 2020-11-30 ENCOUNTER — Ambulatory Visit (INDEPENDENT_AMBULATORY_CARE_PROVIDER_SITE_OTHER): Payer: Medicare Other | Admitting: *Deleted

## 2020-11-30 DIAGNOSIS — IMO0002 Reserved for concepts with insufficient information to code with codable children: Secondary | ICD-10-CM

## 2020-11-30 DIAGNOSIS — E1121 Type 2 diabetes mellitus with diabetic nephropathy: Secondary | ICD-10-CM

## 2020-11-30 DIAGNOSIS — E1165 Type 2 diabetes mellitus with hyperglycemia: Secondary | ICD-10-CM

## 2020-11-30 DIAGNOSIS — Z794 Long term (current) use of insulin: Secondary | ICD-10-CM

## 2020-11-30 NOTE — Progress Notes (Signed)
Libre placed on right arm. Instructions on how to place Elenor Legato discussed with daughter.

## 2020-12-01 DIAGNOSIS — B351 Tinea unguium: Secondary | ICD-10-CM | POA: Diagnosis not present

## 2020-12-01 DIAGNOSIS — M79676 Pain in unspecified toe(s): Secondary | ICD-10-CM | POA: Diagnosis not present

## 2020-12-01 DIAGNOSIS — E1142 Type 2 diabetes mellitus with diabetic polyneuropathy: Secondary | ICD-10-CM | POA: Diagnosis not present

## 2020-12-01 DIAGNOSIS — L84 Corns and callosities: Secondary | ICD-10-CM | POA: Diagnosis not present

## 2020-12-09 DIAGNOSIS — E1165 Type 2 diabetes mellitus with hyperglycemia: Secondary | ICD-10-CM | POA: Diagnosis not present

## 2020-12-16 ENCOUNTER — Ambulatory Visit (INDEPENDENT_AMBULATORY_CARE_PROVIDER_SITE_OTHER): Payer: Medicare Other | Admitting: Pharmacist

## 2020-12-16 DIAGNOSIS — IMO0002 Reserved for concepts with insufficient information to code with codable children: Secondary | ICD-10-CM

## 2020-12-16 NOTE — Progress Notes (Signed)
Chronic Care Management Pharmacy Note  12/16/2020 Name:  Erica Crosby MRN:  [id number] DOB:  Jan 19, 1946  Summary: DIABETES MANAGEMENT  Recommendations/Changes made from today's visit: Diabetes: Uncontrolled; current treatment: LANTUS (holding) Eats dinner at 5-6pm Going low after that 60-70 BG as high as 240 one instance post prandial She is not taking insulin currently Gfr 21/Urine mirco 1400 Reports hypoglycemic  symptoms Discussed meal planning options and Plate method for healthy eating Avoid sugary drinks and desserts Incorporate balanced protein, non starchy veggies, 1 serving of carbohydrate with each meal Increase water intake Increase physical activity as able Current exercise: n/a Counseled on holding insulin; continue to monitor Bgs with libre 2 CGM--encouraged patient to call if BG>200 or <70 persistently; she was educated on treating hypoglyemcia   Plan: F/U IN 1 MONTH  Subjective: Erica Crosby is an 75 y.o. year old female who is a primary patient of [name], DO.  The CCM team was consulted for assistance with disease management and care coordination needs.    Engaged with patient by telephone for follow up visit in response to provider referral for pharmacy case management and/or care coordination services.   Consent to Services:  The patient was given information about Chronic Care Management services, agreed to services, and gave verbal consent prior to initiation of services.  Please see initial visit note for detailed documentation.   Patient Care Team: [name], DO as PCP - General (Family Medicine) Minus Breeding, MD as PCP - Cardiology (Cardiology) Valinda Party, MD as Consulting Physician (Rheumatology) Cassandria Anger, MD as Consulting Physician (Endocrinology) Katha Cabal, LCSW as [place] [facility] (Licensed Clinical Social Worker) Ilean China, RN as Case Manager Corliss Parish, MD as Consulting Physician (Nephrology)   Objective:  Lab Results  Component Value Date   CREATININE 2.41 (H) 10/14/2020   CREATININE 2.05 (H) 06/15/2020   CREATININE 1.74 (H) 12/09/2019    Lab Results  Component Value Date   HGBA1C 6.0 10/14/2020   Last diabetic Eye exam:  Lab Results  Component Value Date/Time   HMDIABEYEEXA No Retinopathy 06/26/2016 12:00 AM    Last diabetic Foot exam: No results found for: HMDIABFOOTEX      Component Value Date/Time   CHOL 141 10/14/2020 0950   TRIG 87 10/14/2020 0950   HDL 47 10/14/2020 0950   CHOLHDL 3.0 10/14/2020 0950   CHOLHDL 3.3 02/10/2012 0520   VLDL 17 02/10/2012 0520   LDLCALC 77 10/14/2020 0950    Hepatic Function Latest Ref Rng & Units 10/14/2020 06/15/2020 12/09/2019  Total Protein 6.0 - 8.5 g/dL 7.1 - 6.7  Albumin 3.7 - 4.7 g/dL 3.9 3.9 3.6(L)  AST 0 - 40 IU/L 15 - 21  ALT 0 - 32 IU/L 9 - 14  Alk Phosphatase 44 - 121 IU/L 111 - 90  Total Bilirubin 0.0 - 1.2 mg/dL 0.3 - 0.4    Lab Results  Component Value Date/Time   TSH 0.365 05/22/2019 06:14 AM   TSH 1.940 05/07/2018 01:56 PM    CBC Latest Ref Rng & Units 06/15/2020 05/25/2019 05/23/2019  WBC 3.4 - 10.8 x10E3/uL 6.5 4.6 4.1  Hemoglobin 11.1 - 15.9 g/dL 10.2(L) 11.0(L) 11.1(L)  Hematocrit 34.0 - 46.6 % 32.6(L) 35.4(L) 35.5(L)  Platelets 150 - 450 x10E3/uL 191 132(L) 159    Lab Results  Component Value Date/Time   VD25OH 50.9 12/09/2019 09:30 AM   VD25OH 24.9 (L) 03/11/2019 03:52 PM  Clinical ASCVD: No  The 10-year ASCVD risk score Mikey Bussing DC Jr., et al., 2013) is: 23.7%   Values used to calculate the score:     Age: 75 years     Sex: Female     Is Non-Hispanic African American: Yes     Diabetic: Yes     Tobacco smoker: No     Systolic Blood Pressure: 428 mmHg     Is BP treated: Yes     HDL Cholesterol: 47 mg/dL     Total Cholesterol: 141 mg/dL    Other: (CHADS2VASc if Afib, PHQ9 if depression, MMRC or CAT for COPD, ACT, DEXA)  Social  History   Tobacco Use  Smoking Status Former   Years: 25.00   Pack years: 0.00   Types: Cigarettes, E-cigarettes   Quit date: 02/02/2013   Years since quitting: 7.8  Smokeless Tobacco Never   BP Readings from Last 3 Encounters:  10/14/20 135/69  06/15/20 138/85  12/09/19 (!) 148/67   Pulse Readings from Last 3 Encounters:  10/14/20 78  06/15/20 69  12/09/19 70   Wt Readings from Last 3 Encounters:  10/14/20 131 lb 12.8 oz (59.8 kg)  06/15/20 126 lb (57.2 kg)  12/09/19 122 lb 12.8 oz (55.7 kg)    Assessment: Review of patient past medical history, allergies, medications, health status, including review of consultants reports, laboratory and other test data, was performed as part of comprehensive evaluation and provision of chronic care management services.   SDOH:  (Social Determinants of Health) assessments and interventions performed:    CCM Care Plan  No Known Allergies  Medications Reviewed Today     Reviewed by Lavera Guise, [facility] (Pharmacist) on 12/20/20 at 75  Med List Status: <None>   Medication Order Taking? Sig Documenting Provider Last Dose Status Informant  Accu-Chek Softclix Lancets lancets [id number] No CHECK BLOOD SUGAR TWICE A DAY AND AS NEEDED Dx E11.21 Ronnie Doss M, DO Taking Active   Adalimumab (HUMIRA PEN) 40 MG/0.4ML PNKT [id number] No Inject 40 mg into the skin every 14 (fourteen) days. Can restart 06/04/19 Orson Lonette, MD Taking Active   aspirin EC 81 MG tablet [id number] No Take 81 mg by mouth daily. [provider] Taking Active Child  atorvastatin (LIPITOR) 10 MG tablet [id number] No Take 1 tablet (10 mg total) by mouth daily. [name], DO Taking Active   Blood Glucose Monitoring Suppl (ACCU-CHEK AVIVA PLUS) w/Device KIT [id number] No Test BS BID and prn Dx E11.21 Ronnie Doss M, DO Taking Active   carvedilol (COREG) 12.5 MG tablet [id number] No Take 1 tablet (12.5 mg total) by mouth 2 (two) times daily with a meal.  Ronnie Doss M, DO Taking Active   folic acid (FOLVITE) 1 MG tablet [id number] No Take 1 tablet (1 mg total) by mouth daily. Ronnie Doss M, DO Taking Active   gabapentin (NEURONTIN) 400 MG capsule [id number]  TAKE 1 CAPSULE BY MOUTH AT BEDTIME. Ronnie Doss M, DO  Active   glucose blood (ACCU-CHEK AVIVA PLUS) test strip [id number] No CHECK BLOOD SUGAR 2 TIMES A DAY AND AS NEEDED Dx E11.21 Ronnie Doss M, DO Taking Active   insulin glargine (LANTUS SOLOSTAR) 100 UNIT/ML Solostar Pen [id number] No Inject 5-10 Units into the skin at bedtime. Ronnie Doss M, DO Taking Active   leflunomide (ARAVA) 20 MG tablet [id number] No Take 1 tablet (20 mg total) by mouth daily. Can restart 06/04/19 Orson Lenaya, MD Taking Active   Omega-3 Fatty Acids (FISH OIL)  1000 MG CAPS [id number] No  [provider] Taking Active   OPTIMAL-D 1.25 MG (50000 UT) capsule [id number] No TAKE 1 CAPSULE BY MOUTH ONCE A WEEK FOR 8 DOSES Ronnie Doss M, DO Taking Active   pantoprazole (PROTONIX) 40 MG tablet [id number]  TAKE 1 TABLET BY MOUTH DAILY. [name], DO  Active             Patient Active Problem List   Diagnosis Date Noted   Acute respiratory failure with hypoxia ([place]) 05/24/2019   COVID-19 virus detected    Acute metabolic encephalopathy 57/90/3833   Hypokalemia 05/22/2019   Hypomagnesemia 05/22/2019   Elevated troponin 05/22/2019   Pneumonia due to COVID-19 virus 05/21/2019   Hyperlipidemia associated with type 2 diabetes mellitus ([place]) 07/07/2018   Noncompliance with medication regimen 07/07/2018   Palpitations 05/07/2018   Chest pain 05/07/2018   RA (rheumatoid arthritis) ([place]) 03/20/2017   Mixed hyperlipidemia 02/13/2017   Hypertension associated with stage 3 chronic kidney disease due to type 2 diabetes mellitus ([place]) 12/24/2016   Personal history of noncompliance with medical treatment, presenting hazards to health 12/24/2016   Bulging lumbar disc 08/22/2016    Syncope 02/10/2012   Ataxia 02/10/2012   Uncontrolled type 2 diabetes mellitus with diabetic nephropathy, with long-term current use of insulin ([place]) 02/10/2012    Immunization History  Administered Date(s) Administered   Influenza, High Dose Seasonal PF 03/11/2018, 02/19/2019   Influenza,inj,Quad PF,6+ Mos 03/18/2016, 03/20/2017   Moderna Sars-Covid-2 Vaccination 07/24/2019, 08/21/2019, 05/11/2020   Pneumococcal Conjugate-13 04/21/2016   Pneumococcal Polysaccharide-23 08/14/2017, 01/03/2018   Zoster Recombinat (Shingrix) 01/03/2018, 03/11/2018    Conditions to be addressed/monitored: HTN, HLD, DMII, and CKD Stage 4  Care Plan : PHARMD MEDICATION MANAGEMENT  Updates made by Lavera Guise, [place] since 12/20/2020 12:00 AM     Problem: DISEASE PROGRESSION PREVENTION      Long-Range Goal: T2DM   This Visit's Progress: Not on track  Priority: High  Note:   Current Barriers:  Unable to maintain control of T2DM-HAVING HYPOGLYCEMIA   Pharmacist Clinical Goal(s):  Over the next 90 days, patient will achieve adherence to monitoring guidelines and medication adherence to achieve therapeutic efficacy maintain control of T2DM as evidenced by [place]  through collaboration with PharmD and provider.    Interventions: 1:1 collaboration with [name], DO regarding development and update of comprehensive plan of care as evidenced by provider attestation and co-signature Inter-disciplinary care team collaboration (see longitudinal plan of care) Comprehensive medication review performed; medication list updated in electronic medical record  Diabetes: Uncontrolled; current treatment: LANTUS (holding) Eats dinner at 5-6pm Going low after that 60-70 BG as high as 240 one instance post prandial She is not taking insulin currently Gfr 21/Urine mirco 1400 Reports hypoglycemic  symptoms Discussed meal planning options and Plate method for healthy  eating Avoid sugary drinks and desserts Incorporate balanced protein, non starchy veggies, 1 serving of carbohydrate with each meal Increase water intake Increase physical activity as able Current exercise: n/a Counseled on holding insulin; continue to monitor Bgs with libre 2 CGM--encouraged patient to call if BG>200 or <70 persistently; she was educated on treating hypoglyemcia   Patient Goals/Self-Care Activities Over the next 90 days, patient will:  - take medications as prescribed check glucose USING LIBRE 2 CMG, document, and provide at future appointments  Follow Up Plan: Telephone follow up appointment with care management team member scheduled for: NEXT MONTH      Medication Assistance:  None required.  Patient affirms current coverage meets needs.  Patient's preferred pharmacy is:  [facility], [address] [place] [id number] Phone: [phone] Fax: [phone]   Follow Up:  Patient agrees to Care Plan and Follow-up.  Plan: Telephone follow up appointment with care management team member scheduled for:  01/10/21  Regina Eck, PharmD, BCPS Clinical Pharmacist, [place]  II Phone [phone]

## 2020-12-20 ENCOUNTER — Other Ambulatory Visit: Payer: Self-pay | Admitting: Family Medicine

## 2020-12-20 NOTE — Patient Instructions (Signed)
Visit Information  PATIENT GOALS:  Goals Addressed               This Visit's Progress     Patient Stated     T2DM PHARMD (pt-stated)        Current Barriers:  Unable to maintain control of T2DM-HAVING HYPOGLYCEMIA   Pharmacist Clinical Goal(s):  Over the next 90 days, patient will achieve adherence to monitoring guidelines and medication adherence to achieve therapeutic efficacy maintain control of T2DM as evidenced by [place]  through collaboration with PharmD and provider.    Interventions: 1:1 collaboration with [name], DO regarding development and update of comprehensive plan of care as evidenced by provider attestation and co-signature Inter-disciplinary care team collaboration (see longitudinal plan of care) Comprehensive medication review performed; medication list updated in electronic medical record  Diabetes: Uncontrolled; current treatment: LANTUS (holding) Eats dinner at 5-6pm Going low after that 60-70 BG as high as 240 one instance post prandial She is not taking insulin currently Gfr 21/Urine mirco 1400 Reports hypoglycemic  symptoms Discussed meal planning options and Plate method for healthy eating Avoid sugary drinks and desserts Incorporate balanced protein, non starchy veggies, 1 serving of carbohydrate with each meal Increase water intake Increase physical activity as able Current exercise: n/a Counseled on holding insulin; continue to monitor Bgs with libre 2 CGM--encouraged patient to call if BG>200 or <70 persistently; she was educated on treating hypoglyemcia   Patient Goals/Self-Care Activities Over the next 90 days, patient will:  - take medications as prescribed check glucose USING LIBRE 2 CMG, document, and provide at future appointments  Follow Up Plan: Telephone follow up appointment with care management team member scheduled for: NEXT MONTH          The patient verbalized understanding  of instructions, educational materials, and care plan provided today and declined offer to receive copy of patient instructions, educational materials, and care plan.   Telephone follow up appointment with care management team member scheduled for: 01/10/21  Regina Eck, PharmD, BCPS Clinical Pharmacist, [place]  II Phone [phone]

## 2021-01-10 ENCOUNTER — Telehealth: Payer: Self-pay

## 2021-01-13 ENCOUNTER — Ambulatory Visit: Payer: Medicare Other | Admitting: Licensed Clinical Social Worker

## 2021-01-13 DIAGNOSIS — N184 Chronic kidney disease, stage 4 (severe): Secondary | ICD-10-CM | POA: Diagnosis not present

## 2021-01-13 DIAGNOSIS — M069 Rheumatoid arthritis, unspecified: Secondary | ICD-10-CM

## 2021-01-13 DIAGNOSIS — Z794 Long term (current) use of insulin: Secondary | ICD-10-CM

## 2021-01-13 DIAGNOSIS — E1165 Type 2 diabetes mellitus with hyperglycemia: Secondary | ICD-10-CM | POA: Diagnosis not present

## 2021-01-13 DIAGNOSIS — E1121 Type 2 diabetes mellitus with diabetic nephropathy: Secondary | ICD-10-CM

## 2021-01-13 DIAGNOSIS — E782 Mixed hyperlipidemia: Secondary | ICD-10-CM

## 2021-01-13 DIAGNOSIS — I129 Hypertensive chronic kidney disease with stage 1 through stage 4 chronic kidney disease, or unspecified chronic kidney disease: Secondary | ICD-10-CM

## 2021-01-13 DIAGNOSIS — E1122 Type 2 diabetes mellitus with diabetic chronic kidney disease: Secondary | ICD-10-CM

## 2021-01-13 DIAGNOSIS — R270 Ataxia, unspecified: Secondary | ICD-10-CM

## 2021-01-13 NOTE — Chronic Care Management (AMB) (Signed)
Chronic Care Management    Clinical Social Work Note  01/13/2021 Name: Erica Crosby MRN: [id number] DOB: [birth year]  Erica Crosby is a 75 y.o. year old female who is a primary care patient of [name], DO. The CCM team was consulted to assist the patient with chronic disease management and/or care coordination needs related to: [name] .   Engaged with patient by telephone for follow up visit in response to provider referral for social work chronic care management and care coordination services.   Consent to Services:  The patient was given information about Chronic Care Management services, agreed to services, and gave verbal consent prior to initiation of services.  Please see initial visit note for detailed documentation.   Patient agreed to services and consent obtained.   Assessment: Review of patient past medical history, allergies, medications, and health status, including review of relevant consultants reports was performed today as part of a comprehensive evaluation and provision of chronic care management and care coordination services.     SDOH (Social Determinants of Health) assessments and interventions performed:  SDOH Interventions    Flowsheet Row Most Recent Value  SDOH Interventions   Depression Interventions/Treatment  --  [informed client of LCSW support and of RNCM support]        Advanced Directives Status: See Vynca application for related entries.  CCM Care Plan  No Known Allergies  Outpatient Encounter Medications as of 01/13/2021  Medication Sig   Accu-Chek Softclix Lancets lancets CHECK BLOOD SUGAR TWICE A DAY AND AS NEEDED Dx E11.21   Adalimumab (HUMIRA PEN) 40 MG/0.4ML PNKT Inject 40 mg into the skin every 14 (fourteen) days. Can restart 06/04/19   aspirin EC 81 MG tablet Take 81 mg by mouth daily.   atorvastatin (LIPITOR) 10 MG tablet Take 1 tablet (10 mg total) by mouth daily.   Blood Glucose Monitoring Suppl (ACCU-CHEK  AVIVA PLUS) w/Device KIT Test BS BID and prn Dx E11.21   carvedilol (COREG) 12.5 MG tablet Take 1 tablet (12.5 mg total) by mouth 2 (two) times daily with a meal.   folic acid (FOLVITE) 1 MG tablet Take 1 tablet (1 mg total) by mouth daily.   gabapentin (NEURONTIN) 400 MG capsule TAKE 1 CAPSULE BY MOUTH AT BEDTIME.   glucose blood (ACCU-CHEK AVIVA PLUS) test strip CHECK BLOOD SUGAR 2 TIMES A DAY AND AS NEEDED Dx E11.21   insulin glargine (LANTUS SOLOSTAR) 100 UNIT/ML Solostar Pen Inject 5-10 Units into the skin at bedtime.   leflunomide (ARAVA) 20 MG tablet Take 1 tablet (20 mg total) by mouth daily. Can restart 06/04/19   Omega-3 Fatty Acids (FISH OIL) 1000 MG CAPS    OPTIMAL-D 1.25 MG (50000 UT) capsule TAKE 1 CAPSULE BY MOUTH ONCE A WEEK FOR 8 DOSES   pantoprazole (PROTONIX) 40 MG tablet TAKE 1 TABLET BY MOUTH DAILY.   No facility-administered encounter medications on file as of 01/13/2021.    Patient Active Problem List   Diagnosis Date Noted   Acute respiratory failure with hypoxia ([name]) 05/24/2019   COVID-19 virus detected    Acute metabolic encephalopathy 17/61/6073   Hypokalemia 05/22/2019   Hypomagnesemia 05/22/2019   Elevated troponin 05/22/2019   Pneumonia due to COVID-19 virus 05/21/2019   Hyperlipidemia associated with type 2 diabetes mellitus ([place]) 07/07/2018   Noncompliance with medication regimen 07/07/2018   Palpitations 05/07/2018   Chest pain 05/07/2018   RA (rheumatoid arthritis) ([place]) 03/20/2017   Mixed hyperlipidemia 02/13/2017   Hypertension associated  with stage 3 chronic kidney disease due to type 2 diabetes mellitus ([place]) 12/24/2016   Personal history of noncompliance with medical treatment, presenting hazards to health 12/24/2016   Bulging lumbar disc 08/22/2016   Syncope 02/10/2012   Ataxia 02/10/2012   Uncontrolled type 2 diabetes mellitus with diabetic nephropathy, with long-term current use of insulin ([employer]) 02/10/2012    Conditions to be  addressed/monitored: monitor client completion of ADLs, as she is able   Care Plan : [place]  Updates made by Katha Cabal, LCSW since 01/13/2021 12:00 AM     Problem: Coping Skills (General Plan of Care)      Goal: Coping Skills Enhanced'Manage ADLS daily as able   Start Date: 01/13/2021  Expected End Date: 04/04/2021  This Visit's Progress: On track  Recent Progress: On track  Priority: Medium  Note:   Current barriers:   Patient in need of assistance with connecting to community resources for possible help in completing her daily ADLs and other daily tasks Patient is unable to independently navigate community resource options without care coordination support Some mobility issues Vision issues  Clinical Goals:   LCSW to talk with client in next 30 days about client completion of ADLs and client completion of daily activities Patient to call LCSW or RNCM as needed in next 30 days for CCM support  Clinical Interventions:  Collaboration with [name], DO regarding development and update of comprehensive plan of care as evidenced by provider attestation and co-signature Assessment of needs, barriers ,of client  Talked with client about pain issues faced by client Talked with client about vision of client (client is using prescribed eye drops; client wears glasses) Talked with client about family support for client (daughter is supportive, son is supportive) Talked with Britteney about mood of client (she said she gets a little sad sometimes and said she sometimes worries about her health needs) Talked with Haely about her support from kidney specialist Talked with Sharman about her use of CGM and her management of Diabetes Talked with client about mobility of client (she said she sometimes walks in her yard and uses a cane to help her walk) Talked with client about medication procurement Talked with client about transport needs. She said she sometimes uses [facility] as her  transport services Talked with client about relaxation activities (she said she enjoys doing puzzles to help her relax) Talked with client about her socializing with others. Encouraged client to call RNCM or LCSW as needed for CCM support Talked with client about issue with her feet (she reported that she has burning in her feet and cramping in her feet when she lies down at night to sleep. She said she went to podiatrist and podiatrist recommended a spray for her to use. She said she has not been able to pick up prescribed spray from pharmacy. Collaborated with RNCM about client nursing needs  Patient Strengths: Has support from her daughter Client attends scheduled medical appointments Takes medications as prescribed  Patient Deficits: Occasional help needed with completing ADLs Some mobility issues  Patient Goals:  Patient will attend scheduled medical appointments in next 30 days Patient will communicate with RNCM or LCSW as needed in nest 30 days for CCm support Patient will communicate regularly with her daughter in next 30 days to discuss the needs of client -  Follow Up Plan: LCSW to call client or her daughter on 02/22/21      [name].Brigitta Pricer MSW, LCSW Licensed Clinical  Social Worker [facility] Care Management [phone]

## 2021-01-13 NOTE — Patient Instructions (Signed)
Visit Information  PATIENT GOALS:  Goals Addressed             This Visit's Progress    Complete ADLs and other daily activities       Timeframe:  Short-Term Goal Priority:  Medium Progress:  On Track Start Date:              01/13/21               Expected End Date:        04/04/21              Follow Up Date  02/22/21   [name] (Patient) Complete ADLs and other daily activities     Why is this important?   Screening tests can find diseases early when they are easier to treat.  Your doctor or nurse will talk with you about which tests are important for you.  Getting shots for common diseases like the flu and shingles will help prevent them.     Patient Strengths: Has support from her daughter Client attends scheduled medical appointments Takes medications as prescribed  Patient Deficits: Occasional help needed with completing ADLs Some mobility issues  Patient Goals:  Patient will attend scheduled medical appointments in next 30 days Patient will communicate with RNCM or LCSW as needed in next 30 days for CCM support Patient will communicate regularly with her daughter in next 30 days to discuss the needs of client -  Follow Up Plan: LCSW to call client or her daughter on 02/22/21          [name].Kelena Garrow MSW, LCSW Licensed Clinical Social Worker [facility] Care Management [phone]

## 2021-01-20 ENCOUNTER — Ambulatory Visit (INDEPENDENT_AMBULATORY_CARE_PROVIDER_SITE_OTHER): Payer: Medicare Other | Admitting: Pharmacist

## 2021-01-20 DIAGNOSIS — IMO0002 Reserved for concepts with insufficient information to code with codable children: Secondary | ICD-10-CM

## 2021-01-20 DIAGNOSIS — Z794 Long term (current) use of insulin: Secondary | ICD-10-CM

## 2021-01-20 DIAGNOSIS — E1165 Type 2 diabetes mellitus with hyperglycemia: Secondary | ICD-10-CM

## 2021-01-20 DIAGNOSIS — E1121 Type 2 diabetes mellitus with diabetic nephropathy: Secondary | ICD-10-CM | POA: Diagnosis not present

## 2021-01-25 ENCOUNTER — Other Ambulatory Visit: Payer: Self-pay | Admitting: Family Medicine

## 2021-01-31 NOTE — Progress Notes (Signed)
Chronic Care Management Pharmacy Note  01/20/2021 Name:  Erica Crosby MRN:  [id number] DOB:  Jan 07, 1946  Summary: DIABETES MANAGEMENT    Recommendations/Changes made from today's visit: Diabetes: Uncontrolled; current treatment: LANTUS (holding) Eats dinner at 5-6pm BG Going low after that 60-70 OVERNIGHT COUNSELED PATIENT TO HAVE 4 0Z OF JUICE ON HAND, SHE IS OFTEN OVER CORRECTING AND REBOUNDING UP TO 200S SHE VERBALIZES UNDERSTANDING WROTE DOWN APPT FOR PCP IN 2 WEEKS FOR FOLLOW UP USING LIBRE 2 CGM PROVIDED BY [employer]ING INSULIN Gfr 21/Urine mirco 1400 Reports hypoglycemic symptoms around 12am-1am Discussed meal planning options and Plate method for healthy eating Avoid sugary drinks and desserts Incorporate balanced protein, non starchy veggies, 1 serving of carbohydrate with each meal Increase water intake Increase physical activity as able Current exercise: n/a Counseled on holding insulin; continue to monitor Bgs with libre 2 CGM--encouraged patient to call if BG>200 or <70 persistently; she was educated on treating hypoglyemcia  Follow Up Plan: Telephone follow up appointment with care management team member scheduled for: NEXT MONTH  Subjective: Erica Crosby is an 75 y.o. year old female who is a primary patient of [name], DO.  The CCM team was consulted for assistance with disease management and care coordination needs.    Engaged with patient by telephone for follow up visit in response to provider referral for pharmacy case management and/or care coordination services.   Consent to Services:  The patient was given information about Chronic Care Management services, agreed to services, and gave verbal consent prior to initiation of services.  Please see initial visit note for detailed documentation.   Patient Care Team: [name], DO as PCP - General (Family Medicine) Minus Breeding, MD as PCP - Cardiology  (Cardiology) Valinda Party, MD as Consulting Physician (Rheumatology) Cassandria Anger, MD as Consulting Physician (Endocrinology) Katha Cabal, LCSW as [place] [facility] (Licensed Clinical Social Worker) Ilean China, RN as Case Manager Corliss Parish, MD as Consulting Physician (Nephrology) Lavera Guise, [facility] as Pharmacist (Family Medicine)   Objective:  Lab Results  Component Value Date   CREATININE 2.41 (H) 10/14/2020   CREATININE 2.05 (H) 06/15/2020   CREATININE 1.74 (H) 12/09/2019    Lab Results  Component Value Date   HGBA1C 6.0 10/14/2020   Last diabetic Eye exam:  Lab Results  Component Value Date/Time   HMDIABEYEEXA No Retinopathy 06/26/2016 12:00 AM    Last diabetic Foot exam: No results found for: HMDIABFOOTEX      Component Value Date/Time   CHOL 141 10/14/2020 0950   TRIG 87 10/14/2020 0950   HDL 47 10/14/2020 0950   CHOLHDL 3.0 10/14/2020 0950   CHOLHDL 3.3 02/10/2012 0520   VLDL 17 02/10/2012 0520   LDLCALC 77 10/14/2020 0950    Hepatic Function Latest Ref Rng & Units 10/14/2020 06/15/2020 12/09/2019  Total Protein 6.0 - 8.5 g/dL 7.1 - 6.7  Albumin 3.7 - 4.7 g/dL 3.9 3.9 3.6(L)  AST 0 - 40 IU/L 15 - 21  ALT 0 - 32 IU/L 9 - 14  Alk Phosphatase 44 - 121 IU/L 111 - 90  Total Bilirubin 0.0 - 1.2 mg/dL 0.3 - 0.4    Lab Results  Component Value Date/Time   TSH 0.365 05/22/2019 06:14 AM   TSH 1.940 05/07/2018 01:56 PM    CBC Latest Ref Rng & Units 06/15/2020 05/25/2019 05/23/2019  WBC 3.4 - 10.8 x10E3/uL 6.5 4.6 4.1  Hemoglobin 11.1 - 15.9 g/dL 10.2(L) 11.0(L) 11.1(L)  Hematocrit 34.0 - 46.6 % 32.6(L) 35.4(L) 35.5(L)  Platelets 150 - 450 x10E3/uL 191 132(L) 159    Lab Results  Component Value Date/Time   VD25OH 50.9 12/09/2019 09:30 AM   VD25OH 24.9 (L) 03/11/2019 03:52 PM    Clinical ASCVD: No  The 10-year ASCVD risk score Mikey Bussing DC Jr., et al., 2013) is: 23.7%   Values used to calculate the  score:     Age: 75 years     Sex: Female     Is Non-Hispanic African American: Yes     Diabetic: Yes     Tobacco smoker: No     Systolic Blood Pressure: 876 mmHg     Is BP treated: Yes     HDL Cholesterol: 47 mg/dL     Total Cholesterol: 141 mg/dL    Other: (CHADS2VASc if Afib, PHQ9 if depression, MMRC or CAT for COPD, ACT, DEXA)  Social History   Tobacco Use  Smoking Status Former   Years: 25.00   Types: Cigarettes, E-cigarettes   Quit date: 02/02/2013   Years since quitting: 8.0  Smokeless Tobacco Never   BP Readings from Last 3 Encounters:  10/14/20 135/69  06/15/20 138/85  12/09/19 (!) 148/67   Pulse Readings from Last 3 Encounters:  10/14/20 78  06/15/20 69  12/09/19 70   Wt Readings from Last 3 Encounters:  10/14/20 131 lb 12.8 oz (59.8 kg)  06/15/20 126 lb (57.2 kg)  12/09/19 122 lb 12.8 oz (55.7 kg)    Assessment: Review of patient past medical history, allergies, medications, health status, including review of consultants reports, laboratory and other test data, was performed as part of comprehensive evaluation and provision of chronic care management services.   SDOH:  (Social Determinants of Health) assessments and interventions performed:    CCM Care Plan  No Known Allergies  Medications Reviewed Today     Reviewed by Lavera Guise, [facility] (Pharmacist) on 12/20/20 at 16  Med List Status: <None>   Medication Order Taking? Sig Documenting Provider Last Dose Status Informant  Accu-Chek Softclix Lancets lancets [id number] No CHECK BLOOD SUGAR TWICE A DAY AND AS NEEDED Dx E11.21 Ronnie Doss M, DO Taking Active   Adalimumab (HUMIRA PEN) 40 MG/0.4ML PNKT [id number] No Inject 40 mg into the skin every 14 (fourteen) days. Can restart 06/04/19 Orson Caelie, MD Taking Active   aspirin EC 81 MG tablet [id number] No Take 81 mg by mouth daily. [provider] Taking Active Child  atorvastatin (LIPITOR) 10 MG tablet [id number] No Take 1 tablet (10 mg  total) by mouth daily. [name], DO Taking Active   Blood Glucose Monitoring Suppl (ACCU-CHEK AVIVA PLUS) w/Device KIT [id number] No Test BS BID and prn Dx E11.21 Ronnie Doss M, DO Taking Active   carvedilol (COREG) 12.5 MG tablet [id number] No Take 1 tablet (12.5 mg total) by mouth 2 (two) times daily with a meal. Ronnie Doss M, DO Taking Active   folic acid (FOLVITE) 1 MG tablet [id number] No Take 1 tablet (1 mg total) by mouth daily. Ronnie Doss M, DO Taking Active   gabapentin (NEURONTIN) 400 MG capsule [id number]  TAKE 1 CAPSULE BY MOUTH AT BEDTIME. Ronnie Doss M, DO  Active   glucose blood (ACCU-CHEK AVIVA PLUS) test strip [id number] No CHECK BLOOD SUGAR 2 TIMES A DAY AND AS NEEDED Dx E11.21 Ronnie Doss M, DO Taking Active   insulin glargine (LANTUS SOLOSTAR) 100 UNIT/ML Solostar Pen [id number]  No Inject 5-10 Units into the skin at bedtime. Ronnie Doss M, DO Taking Active   leflunomide (ARAVA) 20 MG tablet [id number] No Take 1 tablet (20 mg total) by mouth daily. Can restart 06/04/19 Orson Brittinee, MD Taking Active   Omega-3 Fatty Acids (FISH OIL) 1000 MG CAPS [id number] No  [provider] Taking Active   OPTIMAL-D 1.25 MG (50000 UT) capsule [id number] No TAKE 1 CAPSULE BY MOUTH ONCE A WEEK FOR 8 DOSES Ronnie Doss M, DO Taking Active   pantoprazole (PROTONIX) 40 MG tablet [id number]  TAKE 1 TABLET BY MOUTH DAILY. [name], DO  Active             Patient Active Problem List   Diagnosis Date Noted   Acute respiratory failure with hypoxia ([place]) 05/24/2019   COVID-19 virus detected    Acute metabolic encephalopathy 01/65/5374   Hypokalemia 05/22/2019   Hypomagnesemia 05/22/2019   Elevated troponin 05/22/2019   Pneumonia due to COVID-19 virus 05/21/2019   Hyperlipidemia associated with type 2 diabetes mellitus ([place]) 07/07/2018   Noncompliance with medication regimen 07/07/2018   Palpitations 05/07/2018   Chest pain 05/07/2018    RA (rheumatoid arthritis) ([place]) 03/20/2017   Mixed hyperlipidemia 02/13/2017   Hypertension associated with stage 3 chronic kidney disease due to type 2 diabetes mellitus ([place]) 12/24/2016   Personal history of noncompliance with medical treatment, presenting hazards to health 12/24/2016   Bulging lumbar disc 08/22/2016   Syncope 02/10/2012   Ataxia 02/10/2012   Uncontrolled type 2 diabetes mellitus with diabetic nephropathy, with long-term current use of insulin ([place]) 02/10/2012    Immunization History  Administered Date(s) Administered   Influenza, High Dose Seasonal PF 03/11/2018, 02/19/2019   Influenza,inj,Quad PF,6+ Mos 03/18/2016, 03/20/2017   Moderna Sars-Covid-2 Vaccination 07/24/2019, 08/21/2019, 05/11/2020   Pneumococcal Conjugate-13 04/21/2016   Pneumococcal Polysaccharide-23 08/14/2017, 01/03/2018   Zoster Recombinat (Shingrix) 01/03/2018, 03/11/2018    Conditions to be addressed/monitored: DMII  Care Plan : PHARMD MEDICATION MANAGEMENT  Updates made by Lavera Guise, [place] since 01/31/2021 12:00 AM     Problem: DISEASE PROGRESSION PREVENTION      Long-Range Goal: T2DM   Recent Progress: Not on track  Priority: High  Note:   Current Barriers:  Unable to maintain control of T2DM-HAVING HYPOGLYCEMIA  Pharmacist Clinical Goal(s):  Over the next 90 days, patient will achieve adherence to monitoring guidelines and medication adherence to achieve therapeutic efficacy maintain control of T2DM as evidenced by [place]  through collaboration with PharmD and provider.    Interventions: 1:1 collaboration with [name], DO regarding development and update of comprehensive plan of care as evidenced by provider attestation and co-signature Inter-disciplinary care team collaboration (see longitudinal plan of care) Comprehensive medication review performed; medication list updated in electronic medical record  Diabetes: Uncontrolled;  current treatment: LANTUS (holding) Eats dinner at 5-6pm Going low after that 60-70 OVERNIGHT COUNSELED PATIENT TO HAVE 4 0Z OF JUICE ON HAND, SHE IS OFTEN OVER CORRECTING AND REBOUNDING UP TO 200S SHE VERBALIZES UNDERSTANDING WROTE DOWN APPT FOR PCP IN 2 WEEKS FOR FOLLOW UP USING LIBRE 2 CGM PROVIDED BY [employer] INSULIN Gfr 21/Urine mirco 1400 Reports hypoglycemic symptoms around 12am-1am Discussed meal planning options and Plate method for healthy eating Avoid sugary drinks and desserts Incorporate balanced protein, non starchy veggies, 1 serving of carbohydrate with each meal Increase water intake Increase physical activity as able Current exercise: n/a Counseled on holding insulin; continue to monitor Bgs  with libre 2 CGM--encouraged patient to call if BG>200 or <70 persistently; she was educated on treating hypoglyemcia   Patient Goals/Self-Care Activities Over the next 90 days, patient will:  - take medications as prescribed check glucose USING LIBRE 2 CMG, document, and provide at future appointments  Follow Up Plan: Telephone follow up appointment with care management team member scheduled for: NEXT MONTH      Medication Assistance: None required.  Patient affirms current coverage meets needs.  Patient's preferred pharmacy is:  [facility], [address] [place] [address] Phone: [phone] Fax: [phone]   Follow Up:  Patient agrees to Care Plan and Follow-up.  Plan: Telephone follow up appointment with care management team member scheduled for:  NEXT MONTH  Regina Eck, PharmD, BCPS Clinical Pharmacist, [place]  II Phone [phone]

## 2021-01-31 NOTE — Patient Instructions (Signed)
Visit Information  PATIENT GOALS:  Goals Addressed               This Visit's Progress     Patient Stated     T2DM PHARMD (pt-stated)        Current Barriers:  Unable to maintain control of T2DM-HAVING HYPOGLYCEMIA  Pharmacist Clinical Goal(s):  Over the next 90 days, patient will achieve adherence to monitoring guidelines and medication adherence to achieve therapeutic efficacy maintain control of T2DM as evidenced by [place]  through collaboration with PharmD and provider.    Interventions: 1:1 collaboration with [name], DO regarding development and update of comprehensive plan of care as evidenced by provider attestation and co-signature Inter-disciplinary care team collaboration (see longitudinal plan of care) Comprehensive medication review performed; medication list updated in electronic medical record  Diabetes: Uncontrolled; current treatment: LANTUS (holding) Eats dinner at 5-6pm Going low after that 60-70 OVERNIGHT COUNSELED PATIENT TO HAVE 4 0Z OF JUICE ON HAND, SHE IS OFTEN OVER CORRECTING AND REBOUNDING UP TO 200S SHE VERBALIZES UNDERSTANDING WROTE DOWN APPT FOR PCP IN 2 WEEKS FOR FOLLOW UP USING LIBRE 2 CGM PROVIDED BY [employer] INSULIN Gfr 21/Urine mirco 1400 Reports hypoglycemic symptoms around 12am-1am Discussed meal planning options and Plate method for healthy eating Avoid sugary drinks and desserts Incorporate balanced protein, non starchy veggies, 1 serving of carbohydrate with each meal Increase water intake Increase physical activity as able Current exercise: n/a Counseled on holding insulin; continue to monitor Bgs with libre 2 CGM--encouraged patient to call if BG>200 or <70 persistently; she was educated on treating hypoglyemcia   Patient Goals/Self-Care Activities Over the next 90 days, patient will:  - take medications as prescribed check glucose USING LIBRE 2 CMG, document, and  provide at future appointments  Follow Up Plan: Telephone follow up appointment with care management team member scheduled for: NEXT MONTH         The patient verbalized understanding of instructions, educational materials, and care plan provided today and declined offer to receive copy of patient instructions, educational materials, and care plan.   Telephone follow up appointment with care management team member scheduled for: NEXT  MONTH  Signature Regina Eck, PharmD, BCPS Clinical Pharmacist, [place]  II Phone [phone]

## 2021-02-14 ENCOUNTER — Ambulatory Visit (INDEPENDENT_AMBULATORY_CARE_PROVIDER_SITE_OTHER): Payer: Medicare Other | Admitting: Family Medicine

## 2021-02-14 DIAGNOSIS — Z789 Other specified health status: Secondary | ICD-10-CM

## 2021-02-14 DIAGNOSIS — E1122 Type 2 diabetes mellitus with diabetic chronic kidney disease: Secondary | ICD-10-CM | POA: Diagnosis not present

## 2021-02-14 DIAGNOSIS — I129 Hypertensive chronic kidney disease with stage 1 through stage 4 chronic kidney disease, or unspecified chronic kidney disease: Secondary | ICD-10-CM

## 2021-02-14 DIAGNOSIS — N184 Chronic kidney disease, stage 4 (severe): Secondary | ICD-10-CM

## 2021-02-14 DIAGNOSIS — E785 Hyperlipidemia, unspecified: Secondary | ICD-10-CM

## 2021-02-14 DIAGNOSIS — Z7409 Other reduced mobility: Secondary | ICD-10-CM

## 2021-02-14 DIAGNOSIS — Z794 Long term (current) use of insulin: Secondary | ICD-10-CM | POA: Diagnosis not present

## 2021-02-14 DIAGNOSIS — M069 Rheumatoid arthritis, unspecified: Secondary | ICD-10-CM

## 2021-02-14 DIAGNOSIS — R7309 Other abnormal glucose: Secondary | ICD-10-CM | POA: Diagnosis not present

## 2021-02-14 DIAGNOSIS — E1169 Type 2 diabetes mellitus with other specified complication: Secondary | ICD-10-CM

## 2021-02-14 MED ORDER — CARVEDILOL 12.5 MG PO TABS: 12.5000 mg | ORAL_TABLET | Freq: Two times a day (BID) | ORAL | 3 refills | Status: DC

## 2021-02-14 MED ORDER — FOLIC ACID 1 MG PO TABS: 1.0000 mg | ORAL_TABLET | Freq: Every day | ORAL | 3 refills | Status: DC

## 2021-02-14 MED ORDER — ATORVASTATIN CALCIUM 10 MG PO TABS: 10.0000 mg | ORAL_TABLET | Freq: Every day | ORAL | 3 refills | Status: DC

## 2021-02-14 MED ORDER — GABAPENTIN 400 MG PO CAPS: 400.0000 mg | ORAL_CAPSULE | Freq: Every day | ORAL | 3 refills | Status: DC

## 2021-02-14 MED ORDER — PANTOPRAZOLE SODIUM 40 MG PO TBEC: 40.0000 mg | DELAYED_RELEASE_TABLET | Freq: Every day | ORAL | 3 refills | Status: DC

## 2021-02-14 NOTE — Progress Notes (Signed)
Telephone visit  Subjective: CC: not doing good PCP: Erica Norlander, DO HPI:Erica Crosby is a 75 y.o. female calls for telephone consult today. Patient provides verbal consent for consult held via phone.  Due to COVID-19 pandemic this visit was conducted virtually. This visit type was conducted due to national recommendations for restrictions regarding the COVID-19 Pandemic (e.g. social distancing, sheltering in place) in an effort to limit this patient's exposure and mitigate transmission in our community. All issues noted in this document were discussed and addressed.  A physical exam was not performed with this format.   Location of patient: home Location of provider: [facility] Others present for call: none  1. DM w/ labile BGs She reports that she's not doing good.  She's been using [employer] and has been seeing hypoglycemic episodes.  She reports BGs in the 60's.  High BG 240 after a meal.  No insulin due to lows.  She feels that she is eating a good supper each night.  Sometimes feels drunk in the morning when she wakes up.    ROS: Per HPI  No Known Allergies Past Medical History:  Diagnosis Date   Anemia    Arthritis    Cataract    Coronary artery disease    Mild plaque 2012   Diabetes mellitus    Essential hypertension 02/10/2012   GERD (gastroesophageal reflux disease)    Hyperlipidemia    Hypertension    Neuropathy    Peptic ulcer    Rheumatoid arthritis ([place])    TIA (transient ischemic attack) 02/10/2012    Current Outpatient Medications:    Accu-Chek Softclix Lancets lancets, CHECK BLOOD SUGAR TWICE A DAY AND AS NEEDED Dx E11.21, Disp: 200 each, Rfl: 3   Adalimumab (HUMIRA PEN) 40 MG/0.4ML PNKT, Inject 40 mg into the skin every 14 (fourteen) days. Can restart 06/04/19, Disp: 2 each, Rfl:    aspirin EC 81 MG tablet, Take 81 mg by mouth daily., Disp: , Rfl:    atorvastatin (LIPITOR) 10 MG tablet, TAKE 1 TABLET BY MOUTH DAILY., Disp: 90 tablet, Rfl: 0   Blood  Glucose Monitoring Suppl (ACCU-CHEK AVIVA PLUS) w/Device KIT, Test BS BID and prn Dx E11.21, Disp: 1 kit, Rfl: 0   carvedilol (COREG) 12.5 MG tablet, Take 1 tablet (12.5 mg total) by mouth 2 (two) times daily with a meal., Disp: 180 tablet, Rfl: 3   folic acid (FOLVITE) 1 MG tablet, TAKE 1 TABLET BY MOUTH DAILY., Disp: 90 tablet, Rfl: 0   gabapentin (NEURONTIN) 400 MG capsule, TAKE 1 CAPSULE BY MOUTH AT BEDTIME., Disp: 90 capsule, Rfl: 0   glucose blood (ACCU-CHEK AVIVA PLUS) test strip, CHECK BLOOD SUGAR 2 TIMES A DAY AND AS NEEDED Dx E11.21, Disp: 200 strip, Rfl: 3   insulin glargine (LANTUS SOLOSTAR) 100 UNIT/ML Solostar Pen, Inject 5-10 Units into the skin at bedtime., Disp: 15 mL, Rfl: 2   leflunomide (ARAVA) 20 MG tablet, Take 1 tablet (20 mg total) by mouth daily. Can restart 06/04/19, Disp:  , Rfl:    Omega-3 Fatty Acids (FISH OIL) 1000 MG CAPS, , Disp: , Rfl:    OPTIMAL-D 1.25 MG (50000 UT) capsule, TAKE 1 CAPSULE BY MOUTH ONCE A WEEK FOR 8 DOSES, Disp: 8 capsule, Rfl: 0   pantoprazole (PROTONIX) 40 MG tablet, TAKE 1 TABLET BY MOUTH DAILY., Disp: 90 tablet, Rfl: 0  Assessment/ Plan: 75 y.o. 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  She is having some sugar lability despite discontinuation of insulin.  I am  going to reach out to one of the [facility] endocrinologist to figure out what we can do for this patient.  Typically I would consider something like a sugar stabilizer (metformin) but because of her advanced renal disease I do not think this would be a good option for this patient.    Start time: 10:05am End time: 10:14am  Total time spent on patient care (including telephone call/ virtual visit): 9 minutes  [place], [place] [phone]

## 2021-02-21 ENCOUNTER — Telehealth: Payer: Self-pay

## 2021-02-21 NOTE — Telephone Encounter (Signed)
-----   Message from [name], DO sent at 02/21/2021  1:07 PM EDT ----- Erica Crosby to [facility] endocrinologist about her fluctuating blood sugars.  He recommended that she do fingersticks when she is having the low blood sugars to verify that in fact she has a truly low blood sugar.  He felt that the freestyle Elenor Legato could give an accurate blood sugar readings and that they needed to be verified.  If she is in fact having true low blood sugars less than 70 that are verified on the fingerstick, neck step will be referral to endocrinology for further evaluation.  So I a nutshell, please make sure that she is doing fingerstick blood sugars when she sees low blood sugars on her freestyle libre. Record and get back to me if the low BGs continue to occur.

## 2021-02-22 ENCOUNTER — Ambulatory Visit (INDEPENDENT_AMBULATORY_CARE_PROVIDER_SITE_OTHER): Payer: Medicare Other | Admitting: Licensed Clinical Social Worker

## 2021-02-22 DIAGNOSIS — E1169 Type 2 diabetes mellitus with other specified complication: Secondary | ICD-10-CM

## 2021-02-22 DIAGNOSIS — E1122 Type 2 diabetes mellitus with diabetic chronic kidney disease: Secondary | ICD-10-CM

## 2021-02-22 DIAGNOSIS — M069 Rheumatoid arthritis, unspecified: Secondary | ICD-10-CM

## 2021-02-22 DIAGNOSIS — R270 Ataxia, unspecified: Secondary | ICD-10-CM

## 2021-02-22 NOTE — Patient Instructions (Signed)
Visit Information  PATIENT GOALS:  Goals Addressed             This Visit's Progress    Complete ADLs and other daily activities       Timeframe:  Short-Term Goal Priority:  Medium Progress:  On Track Start Date:              02/22/21               Expected End Date:        05/23/21              Follow Up Date  04/05/21   [name] (Patient) Complete ADLs and other daily activities     Why is this important?   Screening tests can find diseases early when they are easier to treat.  Your doctor or nurse will talk with you about which tests are important for you.  Getting shots for common diseases like the flu and shingles will help prevent them.     Patient Strengths: Has support from her daughter Client attends scheduled medical appointments Takes medications as prescribed  Patient Deficits: Occasional help needed with completing ADLs Some mobility issues  Patient Goals:  Patient will attend scheduled medical appointments in next 30 days Patient will communicate with RNCM or LCSW as needed in next 30 days for CCM support Patient will communicate regularly with her daughter in next 30 days to discuss the needs of client -  Follow Up Plan: LCSW to call client or her daughter on 04/05/21    [name].Sadik Piascik MSW, LCSW Licensed Clinical Social Worker [facility] Care Management [phone]

## 2021-02-22 NOTE — Chronic Care Management (AMB) (Signed)
Chronic Care Management    Clinical Social Work Note  02/22/2021 Name: Erica Crosby MRN: [id number] DOB: 1946-04-18  Erica Crosby is a 75 y.o. year old female who is a primary care patient of [name], DO. The CCM team was consulted to assist the patient with chronic disease management and/or care coordination needs related to: [name] .   Engaged with patient by telephone for follow up visit in response to provider referral for social work chronic care management and care coordination services.   Consent to Services:  The patient was given information about Chronic Care Management services, agreed to services, and gave verbal consent prior to initiation of services.  Please see initial visit note for detailed documentation.   Patient agreed to services and consent obtained.   Assessment: Review of patient past medical history, allergies, medications, and health status, including review of relevant consultants reports was performed today as part of a comprehensive evaluation and provision of chronic care management and care coordination services.     SDOH (Social Determinants of Health) assessments and interventions performed:  SDOH Interventions    Flowsheet Row Most Recent Value  SDOH Interventions   Physical Activity Interventions Other (Comments)  [walking challenges,  uses a cane to help her walk]  Depression Interventions/Treatment  --  [informed client of LCSW support and of RNCM support]        Advanced Directives Status: See Vyn[employer] application for related entries.  CCM Care Plan  No Known Allergies  Outpatient Encounter Medications as of 02/22/2021  Medication Sig   Accu-Chek Softclix Lancets lancets CHECK BLOOD SUGAR TWICE A DAY AND AS NEEDED Dx E11.21   Adalimumab (HUMIRA PEN) 40 MG/0.4ML PNKT Inject 40 mg into the skin every 14 (fourteen) days. Can restart 06/04/19   aspirin EC 81 MG tablet Take 81 mg by mouth daily.   atorvastatin (LIPITOR) 10  MG tablet Take 1 tablet (10 mg total) by mouth daily.   Blood Glucose Monitoring Suppl (ACCU-CHEK AVIVA PLUS) w/Device KIT Test BS BID and prn Dx E11.21   carvedilol (COREG) 12.5 MG tablet Take 1 tablet (12.5 mg total) by mouth 2 (two) times daily with a meal.   folic acid (FOLVITE) 1 MG tablet Take 1 tablet (1 mg total) by mouth daily.   gabapentin (NEURONTIN) 400 MG capsule Take 1 capsule (400 mg total) by mouth at bedtime.   glucose blood (ACCU-CHEK AVIVA PLUS) test strip CHECK BLOOD SUGAR 2 TIMES A DAY AND AS NEEDED Dx E11.21   insulin glargine (LANTUS SOLOSTAR) 100 UNIT/ML Solostar Pen Inject 5-10 Units into the skin at bedtime.   leflunomide (ARAVA) 20 MG tablet Take 1 tablet (20 mg total) by mouth daily. Can restart 06/04/19   Omega-3 Fatty Acids (FISH OIL) 1000 MG CAPS    OPTIMAL-D 1.25 MG (50000 UT) capsule TAKE 1 CAPSULE BY MOUTH ONCE A WEEK FOR 8 DOSES   pantoprazole (PROTONIX) 40 MG tablet Take 1 tablet (40 mg total) by mouth daily.   No facility-administered encounter medications on file as of 02/22/2021.    Patient Active Problem List   Diagnosis Date Noted   Pneumonia due to COVID-19 virus 05/21/2019   Hyperlipidemia associated with type 2 diabetes mellitus ([place]) 07/07/2018   Noncompliance with medication regimen 07/07/2018   Palpitations 05/07/2018   RA (rheumatoid arthritis) ([place]) 03/20/2017   Mixed hyperlipidemia 02/13/2017   Hypertension associated with stage 3 chronic kidney disease due to type 2 diabetes mellitus ([place]) 12/24/2016  Personal history of noncompliance with medical treatment, presenting hazards to health 12/24/2016   Bulging lumbar disc 08/22/2016   Syncope 02/10/2012   Ataxia 02/10/2012   Uncontrolled type 2 diabetes mellitus with diabetic nephropathy, with long-term current use of insulin ([place]) 02/10/2012    Conditions to be addressed/monitored: monitor client completion of ADLs; monitor client management of depression issues  Care Plan : [place]  Updates made by Katha Cabal, LCSW since 02/22/2021 12:00 AM     Problem: Coping Skills (General Plan of Care)      Goal: Coping Skills Enhanced'Manage ADLS daily as able   Start Date: 02/22/2021  Expected End Date: 05/23/2021  This Visit's Progress: On track  Recent Progress: On track  Priority: Medium  Note:   Current barriers:   Patient in need of assistance with connecting to community resources for possible help in completing her daily ADLs and other daily tasks Patient is unable to independently navigate community resource options without care coordination support Some mobility issues Vision issues Challenges in managing Diabetes  Clinical Goals:   LCSW to talk with client in next 30 days about client completion of ADLs and client completion of daily activities Patient to call LCSW or RNCM as needed in next 30 days for CCM support  Clinical Interventions:  Collaboration with [name], DO regarding development and update of comprehensive plan of care as evidenced by provider attestation and co-signature Assessment of needs, barriers ,of client  Talked with client about pain issues faced by client Talked with client about vision of client (client is using prescribed eye drops; client wears glasses) Talked with Erica Crosby about mood of client (she said she gets a little sad sometimes and said she sometimes worries about her health needs.  She said she has had these periods of sadness for about two months) Talked with Erica Crosby about her support from kidney specialist. She said she has appointment tomorrow with kidney specialist Talked with Erica Crosby about her use of CGM and her management of Diabetes Talked with client about mobility of client (she said she sometimes walks in her yard and uses a cane to help her walk) Talked with client about medication procurement Talked with client about transport needs. She said she sometimes uses [facility] as her transport services Talked with  client about her difficulty in sleeping. Encouraged client to call RNCM or LCSW as needed for CCM support Provided counseling support for client Talked with client about ADLs completion of client  Patient Strengths: Has support from her daughter Client attends scheduled medical appointments Takes medications as prescribed  Patient Deficits: Occasional help needed with completing ADLs Some mobility issues  Patient Goals:  Patient will attend scheduled medical appointments in next 30 days Patient will communicate with RNCM or LCSW as needed in [place] 30 days for CCm support Patient will communicate regularly with her daughter in next 30 days to discuss the needs of client -  Follow Up Plan: LCSW to call client or her daughter on 04/05/21 to assess needs of client       [name].Joseh Sjogren MSW, LCSW Licensed Clinical Social Worker [facility] Care Management [phone]

## 2021-02-23 ENCOUNTER — Telehealth: Payer: Self-pay

## 2021-02-23 NOTE — Telephone Encounter (Signed)
Pt aware of dr Cathie Beams recommendations

## 2021-02-23 NOTE — Telephone Encounter (Signed)
-----   Message from [name], DO sent at 02/21/2021  1:07 PM EDT ----- Erica Crosby to [facility] endocrinologist about her fluctuating blood sugars.  He recommended that she do fingersticks when she is having the low blood sugars to verify that in fact she has a truly low blood sugar.  He felt that the freestyle Elenor Legato could give an accurate blood sugar readings and that they needed to be verified.  If she is in fact having true low blood sugars less than 70 that are verified on the fingerstick, neck step will be referral to endocrinology for further evaluation.  So I a nutshell, please make sure that she is doing fingerstick blood sugars when she sees low blood sugars on her freestyle libre. Record and get back to me if the low BGs continue to occur.

## 2021-02-27 ENCOUNTER — Ambulatory Visit: Payer: Medicare Other | Admitting: *Deleted

## 2021-02-27 ENCOUNTER — Telehealth: Payer: Self-pay | Admitting: Family Medicine

## 2021-02-27 ENCOUNTER — Other Ambulatory Visit: Payer: Self-pay

## 2021-02-27 DIAGNOSIS — I129 Hypertensive chronic kidney disease with stage 1 through stage 4 chronic kidney disease, or unspecified chronic kidney disease: Secondary | ICD-10-CM

## 2021-02-27 DIAGNOSIS — E1122 Type 2 diabetes mellitus with diabetic chronic kidney disease: Secondary | ICD-10-CM

## 2021-02-27 MED ORDER — ACCU-CHEK AVIVA PLUS VI STRP: ORAL_STRIP | 3 refills | Status: DC

## 2021-02-27 NOTE — Telephone Encounter (Signed)
Accu check test strips refilled.  Pt does not have Libre.

## 2021-02-27 NOTE — Chronic Care Management (AMB) (Signed)
Chronic Care Management   CCM RN Visit Note  02/27/2021 Name: Erica Crosby MRN: [id number] DOB: [birth year]  Subjective: Erica Crosby is a 75 y.o. year old female who is a primary care patient of [name], DO. The care management team was consulted for assistance with disease management and care coordination needs.    Engaged with patient by telephone for follow up visit in response to provider referral for case management and/or care coordination services.   Consent to Services:  The patient was given information about Chronic Care Management services, agreed to services, and gave verbal consent prior to initiation of services.  Please see initial visit note for detailed documentation.   Patient agreed to services and verbal consent obtained.   Assessment: Review of patient past medical history, allergies, medications, health status, including review of consultants reports, laboratory and other test data, was performed as part of comprehensive evaluation and provision of chronic care management services.   SDOH (Social Determinants of Health) assessments and interventions performed:    CCM Care Plan  No Known Allergies  Outpatient Encounter Medications as of 02/27/2021  Medication Sig   Accu-Chek Softclix Lancets lancets CHECK BLOOD SUGAR TWICE A DAY AND AS NEEDED Dx E11.21   Adalimumab (HUMIRA PEN) 40 MG/0.4ML PNKT Inject 40 mg into the skin every 14 (fourteen) days. Can restart 06/04/19   aspirin EC 81 MG tablet Take 81 mg by mouth daily.   atorvastatin (LIPITOR) 10 MG tablet Take 1 tablet (10 mg total) by mouth daily.   Blood Glucose Monitoring Suppl (ACCU-CHEK AVIVA PLUS) w/Device KIT Test BS BID and prn Dx E11.21   carvedilol (COREG) 12.5 MG tablet Take 1 tablet (12.5 mg total) by mouth 2 (two) times daily with a meal.   folic acid (FOLVITE) 1 MG tablet Take 1 tablet (1 mg total) by mouth daily.   gabapentin (NEURONTIN) 400 MG capsule Take 1 capsule (400 mg total) by  mouth at bedtime.   glucose blood (ACCU-CHEK AVIVA PLUS) test strip CHECK BLOOD SUGAR 2 TIMES A DAY AND AS NEEDED Dx E11.21   insulin glargine (LANTUS SOLOSTAR) 100 UNIT/ML Solostar Pen Inject 5-10 Units into the skin at bedtime.   leflunomide (ARAVA) 20 MG tablet Take 1 tablet (20 mg total) by mouth daily. Can restart 06/04/19   Omega-3 Fatty Acids (FISH OIL) 1000 MG CAPS    OPTIMAL-D 1.25 MG (50000 UT) capsule TAKE 1 CAPSULE BY MOUTH ONCE A WEEK FOR 8 DOSES   pantoprazole (PROTONIX) 40 MG tablet Take 1 tablet (40 mg total) by mouth daily.   No facility-administered encounter medications on file as of 02/27/2021.    Patient Active Problem List   Diagnosis Date Noted   Pneumonia due to COVID-19 virus 05/21/2019   Hyperlipidemia associated with type 2 diabetes mellitus ([place]) 07/07/2018   Noncompliance with medication regimen 07/07/2018   Palpitations 05/07/2018   RA (rheumatoid arthritis) ([place]) 03/20/2017   Mixed hyperlipidemia 02/13/2017   Hypertension associated with stage 3 chronic kidney disease due to type 2 diabetes mellitus ([place]) 12/24/2016   Personal history of noncompliance with medical treatment, presenting hazards to health 12/24/2016   Bulging lumbar disc 08/22/2016   Syncope 02/10/2012   Ataxia 02/10/2012   Uncontrolled type 2 diabetes mellitus with diabetic nephropathy, with long-term current use of insulin ([place]) 02/10/2012    Conditions to be addressed/monitored:DMII and CKD Stage 3  Care Plan : [place]  Updates made by Ilean China, RN since 02/27/2021 12:00  AM     Problem: Chronic Disease Management Needs   Priority: Medium     Long-Range Goal: [facility] with [facility] Regarding Care Management and Care Coordination Associated with diabetes and CKD   This Visit's Progress: On track  Priority: Medium  Note:   Current Barriers:  Chronic Disease Management support and education needs related to DMII and CKD Stage 3 Transportation barriers  RNCM Clinical  Goal(s):  Patient will take all medications exactly as prescribed and will call provider for medication related questions continue to work with RN Care Manager to address care management and care coordination needs related to DMII and CKD Stage 3  work with pharmacist to address medication management related to DMII through collaboration with RN Care manager, provider, and care team.   Interventions: 1:1 collaboration with primary care provider regarding development and update of comprehensive plan of care as evidenced by provider attestation and co-signature Inter-disciplinary care team collaboration (see longitudinal plan of care) Evaluation of current treatment plan related to  self management and patient's adherence to plan as established by provider Consulted by LCSW regarding patient's current medical state   Chronic Kidney Disease (Status: Goal on track: YES.)  Last practice recorded BP readings:  BP Readings from Last 3 Encounters:  10/14/20 135/69  06/15/20 138/85  12/09/19 (!) 148/67  Most recent eGFR/CrCl:  Lab Results  Component Value Date   EGFR 21 (L) 10/14/2020    No components found for: CRCL  Evaluation of current treatment plan related to chronic kidney disease self management and patient's adherence to plan as established by provider      Reviewed medications with patient and discussed importance of compliance    Advised patient, providing education and rationale, to monitor blood pressure daily and record, calling PCP for findings outside established parameters    Discussed complications of poorly controlled blood pressure such as heart disease, stroke, circulatory complications, vision complications, kidney impairment, sexual dysfunction    Reviewed scheduled/upcoming provider appointments including    Discussed plans with patient for ongoing care management follow up and provided patient with direct contact information for care management team    Assessed social  determinant of health barriers    Discussed use of [employer] catalog to order useful medical supplies, vitamins, etc   Diabetes:  (Status: Goal on track: YES.) Lab Results  Component Value Date   HGBA1C 6.0 10/14/2020  Assessed patient's understanding of A1c goal: <7% Reviewed medications with patient and discussed importance of medication adherence;        Counseled on importance of regular laboratory monitoring as prescribed;        Discussed plans with patient for ongoing care management follow up and provided patient with direct contact information for care management team;      Reviewed scheduled/upcoming provider appointments including: 03/03/21 telephone visit with Lottie Dawson, PharmD;         Advised patient, providing education and rationale, to check cbg continuously with CGM and record        call provider for findings outside established parameters;       Review of patient status, including review of consultants reports, relevant laboratory and other test results, and medications completed;       Advised patient to discuss possible need for freestyle libre script with provider;      Provided with [facility] telephone number [phone] so that her daughter can call to request a refill of sensors. Placed her last one last Wednesday.  Patient Goals/Self-Care Activities: Patient will self administer medications as prescribed Patient will call pharmacy for medication refills Patient will continue to perform ADL's independently Patient will call provider office for new concerns or questions       Plan:Telephone follow up appointment with care management team member scheduled for:  03/29/21 with RNCM and The patient has been provided with contact information for the care management team and has been advised to call with any health related questions or concerns.   Chong Sicilian, BSN, RN-BC Embedded Chronic Care Manager [facility] / [place]  Management Direct Dial: [phone]

## 2021-02-27 NOTE — Patient Instructions (Signed)
Visit Information  PATIENT GOALS:  Goals Addressed             This Visit's Progress    Follow My Treatment Plan-Chronic Kidney   On track    Timeframe:  Long-Range Goal Priority:  Low Start Date: 07/22/20                            Expected End Date: 01/19/21                      Follow Up Date 03/29/21   Take all medication as prescribed Call nephrologist or PCP if you have any questions or concerns Keep all follow-up appointments with nephrologist and PCP Manage blood pressure and blood sugar by checking them regularly and taking your medication Call PCP if you blood pressure or blood sugar is running higher than recommended  Call RN Care Manager as needed [phone]    Why is this important?   Staying as healthy as you can is very important. This may mean making changes if you smoke, don't exercise or eat poorly.  A healthy lifestyle is an important goal for you.  Following the treatment plan and making changes may be hard.  Try some of these steps to help keep the disease from getting worse.     Notes:      Monitor and Manage My Blood Sugar-Diabetes Type 2   On track    Timeframe:  Long-Range Goal Priority:  High Start Date:                             Expected End Date:                       Follow Up Date 03/29/21   Take medications as prescribed Continue to hold lantus Use freestyle libre to check blood sugar continuously and as needed  Call [facility] this week to refill libre sensors [phone] Keep appt with Lottie Dawson, PharmD on 03/03/21 Monitor for signs of hyper and hypoglycemia Call PCP at [phone] if blood sugar readings are below 70 or above 300 Reach out to RN Care Manager as needed [phone]   Why is this important?   Checking your blood sugar at home helps to keep it from getting very high or very low.  Writing the results in a diary or log helps the doctor know how to care for you.  Your blood sugar log should have the  time, date and the results.  Also, write down the amount of insulin or other medicine that you take.  Other information, like what you ate, exercise done and how you were feeling, will also be helpful.     Notes:         Patient verbalizes understanding of instructions provided today and agrees to view in [place].   Plan:Telephone follow up appointment with care management team member scheduled for:  03/29/21 with RNCM and The patient has been provided with contact information for the care management team and has been advised to call with any health related questions or concerns.   Chong Sicilian, BSN, RN-BC Embedded Chronic Care Manager [facility] / [place] Management Direct Dial: [phone]

## 2021-02-27 NOTE — Telephone Encounter (Signed)
  Prescription Request  02/27/2021   What is the name of the medication or equipment? LIBRE strips  Have you contacted your pharmacy to request a refill? Yes  Which pharmacy would you like this sent to?  [place] says she has about 2 wks left before she will be out.

## 2021-03-02 DIAGNOSIS — Z23 Encounter for immunization: Secondary | ICD-10-CM | POA: Diagnosis not present

## 2021-03-02 DIAGNOSIS — N390 Urinary tract infection, site not specified: Secondary | ICD-10-CM | POA: Diagnosis not present

## 2021-03-02 DIAGNOSIS — I129 Hypertensive chronic kidney disease with stage 1 through stage 4 chronic kidney disease, or unspecified chronic kidney disease: Secondary | ICD-10-CM | POA: Diagnosis not present

## 2021-03-02 DIAGNOSIS — E1122 Type 2 diabetes mellitus with diabetic chronic kidney disease: Secondary | ICD-10-CM | POA: Diagnosis not present

## 2021-03-02 DIAGNOSIS — M069 Rheumatoid arthritis, unspecified: Secondary | ICD-10-CM | POA: Diagnosis not present

## 2021-03-02 DIAGNOSIS — N179 Acute kidney failure, unspecified: Secondary | ICD-10-CM | POA: Diagnosis not present

## 2021-03-02 DIAGNOSIS — N189 Chronic kidney disease, unspecified: Secondary | ICD-10-CM | POA: Diagnosis not present

## 2021-03-02 DIAGNOSIS — E1129 Type 2 diabetes mellitus with other diabetic kidney complication: Secondary | ICD-10-CM | POA: Diagnosis not present

## 2021-03-02 DIAGNOSIS — R634 Abnormal weight loss: Secondary | ICD-10-CM | POA: Diagnosis not present

## 2021-03-02 DIAGNOSIS — N183 Chronic kidney disease, stage 3 unspecified: Secondary | ICD-10-CM | POA: Diagnosis not present

## 2021-03-03 ENCOUNTER — Telehealth: Payer: Medicare Other

## 2021-03-03 DIAGNOSIS — E1165 Type 2 diabetes mellitus with hyperglycemia: Secondary | ICD-10-CM | POA: Diagnosis not present

## 2021-03-08 ENCOUNTER — Ambulatory Visit (INDEPENDENT_AMBULATORY_CARE_PROVIDER_SITE_OTHER): Payer: Medicare Other

## 2021-03-08 VITALS — Ht 67.0 in | Wt 131.0 lb

## 2021-03-08 DIAGNOSIS — Z0000 Encounter for general adult medical examination without abnormal findings: Secondary | ICD-10-CM

## 2021-03-08 DIAGNOSIS — M0510 Rheumatoid lung disease with rheumatoid arthritis of unspecified site: Secondary | ICD-10-CM | POA: Insufficient documentation

## 2021-03-08 DIAGNOSIS — M810 Age-related osteoporosis without current pathological fracture: Secondary | ICD-10-CM | POA: Insufficient documentation

## 2021-03-08 DIAGNOSIS — Z79899 Other long term (current) drug therapy: Secondary | ICD-10-CM | POA: Insufficient documentation

## 2021-03-08 NOTE — Progress Notes (Signed)
Subjective:   Erica Crosby is a 75 y.o. female who presents for Medicare Annual (Subsequent) preventive examination.  Virtual Visit via Telephone Note  I connected with  FRANCILLE WITTMANN on 03/08/21 at  2:00 PM EDT by telephone and verified that I am speaking with the correct person using two identifiers.  Location: Patient: Home Provider: [facility] Persons participating in the virtual visit: patient/Nurse Health Advisor   I discussed the limitations, risks, security and privacy concerns of performing an evaluation and management service by telephone and the availability of in person appointments. The patient expressed understanding and agreed to proceed.  Interactive audio and video telecommunications were attempted between this nurse and patient, however failed, due to patient having technical difficulties OR patient did not have access to video capability.  We continued and completed visit with audio only.  Some vital signs may be absent or patient reported.   Nashly Olsson E Avner Stroder, LPN   Review of Systems     Cardiac Risk Factors include: advanced age (>33mn, >>33women);diabetes mellitus;dyslipidemia;hypertension;sedentary lifestyle;Other (see comment), Risk factor comments: rheumatoid lung disease     Objective:    Today's Vitals   03/08/21 1358 03/08/21 1359  Weight: 131 lb (59.4 kg)   Height: _0  (1.702 m)   PainSc:  5    Body mass index is 20.52 kg/m.  Advanced Directives 03/08/2021 03/01/2020 05/21/2019 02/10/2019 02/23/2018 08/14/2017 01/21/2017  Does Patient Have a Medical Advance Directive? _1  No No  Would patient like information on creating a medical advance directive? No - Patient declined No - Patient declined No - Patient declined No - Patient declined No - Patient declined No - Patient declined -    Current Medications (verified) Outpatient Encounter Medications as of 03/08/2021  Medication Sig   Accu-Chek Softclix Lancets lancets CHECK BLOOD SUGAR TWICE A DAY  AND AS NEEDED Dx E11.21   Adalimumab (HUMIRA PEN) 40 MG/0.4ML PNKT Inject 40 mg into the skin every 14 (fourteen) days. Can restart 06/04/19   aspirin EC 81 MG tablet Take 81 mg by mouth daily.   atorvastatin (LIPITOR) 10 MG tablet Take 1 tablet (10 mg total) by mouth daily.   Blood Glucose Monitoring Suppl (ACCU-CHEK AVIVA PLUS) w/Device KIT Test BS BID and prn Dx E11.21   carvedilol (COREG) 12.5 MG tablet Take 1 tablet (12.5 mg total) by mouth 2 (two) times daily with a meal.   ciprofloxacin (CIPRO) 500 MG tablet Take 500 mg by mouth daily.   enalapril (VASOTEC) 20 MG tablet 1 tablet   folic acid (FOLVITE) 1 MG tablet Take 1 tablet (1 mg total) by mouth daily.   gabapentin (NEURONTIN) 400 MG capsule Take 1 capsule (400 mg total) by mouth at bedtime.   glucose blood (ACCU-CHEK AVIVA PLUS) test strip CHECK BLOOD SUGAR 2 TIMES A DAY AND AS NEEDED Dx E11.21   insulin glargine (LANTUS SOLOSTAR) 100 UNIT/ML Solostar Pen Inject 5-10 Units into the skin at bedtime.   leflunomide (ARAVA) 20 MG tablet Take 1 tablet (20 mg total) by mouth daily. Can restart 06/04/19   Omega-3 Fatty Acids (FISH OIL) 1000 MG CAPS    OPTIMAL-D 1.25 MG (50000 UT) capsule TAKE 1 CAPSULE BY MOUTH ONCE A WEEK FOR 8 DOSES   pantoprazole (PROTONIX) 40 MG tablet Take 1 tablet (40 mg total) by mouth daily.   [DISCONTINUED] leflunomide (ARAVA) 20 MG tablet Take 1 tablet by mouth daily.   No facility-administered encounter medications on file as of  03/08/2021.    Allergies (verified) Patient has no known allergies.   History: Past Medical History:  Diagnosis Date   Anemia    Arthritis    Cataract    Coronary artery disease    Mild plaque 2012   Diabetes mellitus    Essential hypertension 02/10/2012   GERD (gastroesophageal reflux disease)    Hyperlipidemia    Hypertension    Neuropathy    Peptic ulcer    Rheumatoid arthritis (HCC)    TIA (transient ischemic attack) 02/10/2012   Past Surgical History:  Procedure  Laterality Date   ABSCESS DRAINAGE     CARPAL TUNNEL RELEASE Left    CHOLECYSTECTOMY     KNEE SURGERY Right    NECK SURGERY     x 2   SHOULDER SURGERY Left    Family History  Problem Relation Age of Onset   Heart disease Sister        Congeital (died age 22) with "enlarged heart"   CAD Sister    Diabetes Mother    Heart disease Mother        Later onset heart disease    Dementia Mother    Alcohol abuse Father    Liver disease Father    CAD Sister 84       CABG   Early death Sister    Diabetes Brother    Kidney disease Brother        related to DM   Diabetes Brother    Social History   Socioeconomic History   Marital status: Widowed    Spouse name: Not on file   Number of children: 3   Years of education: 49   Highest education level: High school graduate  Occupational History   Occupation: retired  Tobacco Use   Smoking status: Former    Years: 25.00    Types: Cigarettes, E-cigarettes    Quit date: 02/02/2013    Years since quitting: 8.0   Smokeless tobacco: Never  Vaping Use   Vaping Use: Never used  Substance and Sexual Activity   Alcohol use: No   Drug use: No   Sexual activity: Not Currently    Birth control/protection: Post-menopausal  Other Topics Concern   Not on file  Social History Narrative   Daughter stays with her.    Son comes daily when daughter is working   Investment banker, operational of Radio broadcast assistant Strain: Medium Risk   Difficulty of Paying Living Expenses: Somewhat hard  Food Insecurity: Landscape architect Present   Worried About Charity fundraiser in the Last Year: Sometimes true   Arboriculturist in the Last Year: Never true  Transportation Needs: Public librarian (Medical): Yes   Lack of Transportation (Non-Medical): Yes  Physical Activity: Inactive   Days of Exercise per Week: 0 days   Minutes of Exercise per Session: 0 min  Stress: No Stress Concern Present   Feeling of Stress : Only a  little  Social Connections: Socially Isolated   Frequency of Communication with Friends and Family: More than three times a week   Frequency of Social Gatherings with Friends and Family: Once a week   Attends Religious Services: Never   Marine scientist or Organizations: No   Attends Archivist Meetings: Never   Marital Status: Widowed    Tobacco Counseling Counseling given: Not Answered   Clinical Intake:  Pre-visit preparation completed: Yes  Pain :  0-10 Pain Score: 5  Pain Type: Chronic pain Pain Location: Generalized Pain Descriptors / Indicators: Aching, Discomfort, Sore Pain Onset: More than a month ago Pain Frequency: Intermittent     BMI - recorded: 20.52 Nutritional Status: BMI of 19-24  Normal Diabetes: Yes CBG done?: No Did pt. bring in CBG monitor from home?: No  How often do you need to have someone help you when you read instructions, pamphlets, or other written materials from your doctor or pharmacy?: 1 - Never  Diabetic? Nutrition Risk Assessment:  Has the patient had any N/V/D within the last 2 months?  No  Does the patient have any non-healing wounds?  Yes  - has a sore on bottom that has healed but is still very tender - has been there for months. Has the patient had any unintentional weight loss or weight gain?  Yes   Diabetes:  Is the patient diabetic?  Yes  If diabetic, was a CBG obtained today?  No  Did the patient bring in their glucometer from home?  No  How often do you monitor your CBG's? Once daily when she remembers.   Financial Strains and Diabetes Management:  Are you having any financial strains with the device, your supplies or your medication? No .  Does the patient want to be seen by Chronic Care Management for management of their diabetes?  No  Would the patient like to be referred to a Nutritionist or for Diabetic Management?  No   Diabetic Exams:  Diabetic Eye Exam: Completed 2021.   Diabetic Foot Exam:  Completed 12/09/2019. Pt has been advised about the importance in completing this exam. Pt is scheduled for diabetic foot exam on 06/16/2021.    Interpreter Needed?: No  Information entered by :: Auset Fritzler, LPN   Activities of Daily Living In your present state of health, do you have any difficulty performing the following activities: 03/08/2021  Hearing? N  Vision? N  Difficulty concentrating or making decisions? Y  Walking or climbing stairs? Y  Dressing or bathing? N  Doing errands, shopping? Y  Preparing Food and eating ? N  Using the Toilet? N  In the past six months, have you accidently leaked urine? Y  Do you have problems with loss of bowel control? Y  Comment sometimes  Managing your Medications? Y  Managing your Finances? Y  Housekeeping or managing your Housekeeping? Y  Some recent data might be hidden    Patient Care Team: [name], DO as PCP - General (Family Medicine) Minus Breeding, MD as PCP - Cardiology (Cardiology) Valinda Party, MD as Consulting Physician (Rheumatology) Cassandria Anger, MD as Consulting Physician (Endocrinology) Shea Evans [name], LCSW as [place] Management (Licensed Clinical Social Worker) Ilean China, RN as Case Manager Corliss Parish, MD as Consulting Physician (Nephrology) Lavera Guise, [facility] as Pharmacist (Family Medicine)  Indicate any recent [place] you may have received from other than [facility] providers in the past year (date may be approximate).     Assessment:   This is a routine wellness examination for [place].  Hearing/Vision screen Hearing Screening - Comments:: Denies hearing difficulties  Vision Screening - Comments:: Wears eyeglasses - up to date with annual eye exams with Dr Marin Comment in [place]  Dietary issues and exercise activities discussed: Current Exercise Habits: The patient does not participate in regular exercise at present, Exercise limited by: orthopedic  condition(s)   Goals Addressed  This Visit's Progress    Have 3 meals a day   No change    Manage My Emotions   Not on track    Monitor and Manage My Blood Sugar-Diabetes Type 2   On track    Timeframe:  Long-Range Goal Priority:  High Start Date:                             Expected End Date:                       Follow Up Date 03/29/21   Take medications as prescribed Continue to hold lantus Use freestyle libre to check blood sugar continuously and as needed  Call [facility] this week to refill libre sensors [phone] Keep appt with Lottie Dawson, PharmD on 03/03/21 Monitor for signs of hyper and hypoglycemia Call PCP at [phone] if blood sugar readings are below 70 or above 300 Reach out to RN Care Manager as needed [phone]   Why is this important?   Checking your blood sugar at home helps to keep it from getting very high or very low.  Writing the results in a diary or log helps the doctor know how to care for you.  Your blood sugar log should have the time, date and the results.  Also, write down the amount of insulin or other medicine that you take.  Other information, like what you ate, exercise done and how you were feeling, will also be helpful.     Notes:        Depression Screen PHQ 2/9 Scores 03/08/2021 02/22/2021 01/13/2021 10/20/2020 10/14/2020 06/15/2020 03/01/2020  PHQ - 2 Score _0 0 0 4 0  PHQ- 9 Score _1 - 11 -    Fall Risk Fall Risk  03/08/2021 10/14/2020 06/15/2020 03/01/2020 12/09/2019  Falls in the past year? _2 0 0  Number falls in past yr: _3 - -  Injury with Fall? 0 0 0 - -  Comment - - - - -  Risk Factor Category  - - - - -  Risk for fall due to : Impaired balance/gait;History of fall(s);Medication side effect;Mental status change;Orthopedic patient History of fall(s) History of fall(s);Impaired mobility - -  Risk for fall due to: Comment - - - - -  Follow up Education provided;Falls prevention  discussed Falls evaluation completed Falls prevention discussed - -  Comment - - - - -    FALL RISK PREVENTION PERTAINING TO THE HOME:  Any stairs in or around the home? Yes  If so, are there any without handrails? No  Home free of loose throw rugs in walkways, pet beds, electrical cords, etc? Yes  Adequate lighting in your home to reduce risk of falls? Yes   ASSISTIVE DEVICES UTILIZED TO PREVENT FALLS:  Life alert? No  Use of a cane, walker or w/c? Yes  Grab bars in the bathroom? Yes  Shower chair or bench in shower? Yes  Elevated toilet seat or a handicapped toilet? Yes   TIMED UP AND GO:  Was the test performed? No . Telephonic visit  Cognitive Function: MMSE - Mini Mental State Exam 08/14/2017 08/22/2016  Not completed: (No Data) -  Orientation to time 5 4  Orientation to Place 5 5  Registration 3 3  Attention/ Calculation 0 0  Attention/Calculation-comments not attempted -  Recall 1 3  Language- name 2 objects 2 2  Language- repeat 1 1  Language- follow 3 step command 3 3  Language- read & follow direction 1 1  Write a sentence 1 1  Copy design 1 1  Total score 23 24     6CIT Screen 03/08/2021 03/01/2020 02/10/2019  What Year? 0 points 0 points 0 points  What month? 0 points 0 points 0 points  What time? 0 points 0 points 0 points  Count back from 20 0 points 0 points 0 points  Months in reverse 4 points 4 points 0 points  Repeat phrase 8 points 2 points 6 points  Total Score _0 Immunizations Immunization History  Administered Date(s) Administered   Influenza Split 04/05/2010   Influenza, High Dose Seasonal PF 03/11/2018, 02/19/2019   Influenza,inj,Quad PF,6+ Mos 03/18/2016, 03/20/2017   Influenza-Unspecified 03/02/2021   Moderna Sars-Covid-2 Vaccination 07/24/2019, 08/21/2019, 05/11/2020   Pneumococcal Conjugate-13 04/21/2016   Pneumococcal Polysaccharide-23 04/05/2010, 08/14/2017, 01/03/2018   Tdap 11/05/2009   Zoster Recombinat (Shingrix)  01/03/2018, 03/11/2018    TDAP status: Due, Education has been provided regarding the importance of this vaccine. Advised may receive this vaccine at local pharmacy or Health Dept. Aware to provide a copy of the vaccination record if obtained from local pharmacy or Health Dept. Verbalized acceptance and understanding.  Flu Vaccine status: Up to date  Pneumococcal vaccine status: Up to date  Covid-19 vaccine status: Completed vaccines  Qualifies for Shingles Vaccine? Yes   Zostavax completed Yes   Shingrix Completed?: Yes  Screening Tests Health Maintenance  Topic Date Due   OPHTHALMOLOGY EXAM  06/26/2017   COVID-19 Vaccine (4 - Booster for Moderna series) 08/03/2020   FOOT EXAM  12/08/2020   TETANUS/TDAP  06/15/2021 (Originally 11/06/2019)   HEMOGLOBIN A1C  04/15/2021   MAMMOGRAM  03/16/2022   COLONOSCOPY (Pts 45-37yr Insurance coverage will need to be confirmed)  08/14/2026   DEXA SCAN  Completed   Hepatitis C Screening  Completed   Zoster Vaccines- Shingrix  Completed   HPV VACCINES  Aged Out   INFLUENZA VACCINE  Discontinued    Health Maintenance  Health Maintenance Due  Topic Date Due   OPHTHALMOLOGY EXAM  06/26/2017   COVID-19 Vaccine (4 - Booster for Moderna series) 08/03/2020   FOOT EXAM  12/08/2020    Colorectal cancer screening: No longer required.   Mammogram status: Completed 03/16/2020. Repeat every year  Bone Density status: Completed 12/02/2019. Results reflect: Bone density results: OSTEOPOROSIS. Repeat every 2 years.  Lung Cancer Screening: (Low Dose CT Chest recommended if Age 763-80years, 30 pack-year currently smoking OR have quit w/in 15years.) does not qualify.   Additional Screening:  Hepatitis C Screening: does qualify; Completed 06/15/2020  Vision Screening: Recommended annual ophthalmology exams for early detection of glaucoma and other disorders of the eye. Is the patient up to date with their annual eye exam?  Yes  Who is the provider or  what is the name of the office in which the patient attends annual eye exams? [facility] SarIf pt is not established with a provider, would they like to be referred to a provider to establish care? No .   Dental Screening: Recommended annual dental exams for proper oral hygiene  Community Resource Referral / Chronic Care Management: CRR required this visit?  Yes   CCM required this visit?  No      Plan:     I have personally reviewed and noted the following in  the patient's chart:   Medical and social history Use of alcohol, tobacco or illicit drugs  Current medications and supplements including opioid prescriptions.  Functional ability and status Nutritional status Physical activity Advanced directives List of other physicians Hospitalizations, surgeries, and ER visits in previous 12 months Vitals Screenings to include cognitive, depression, and falls Referrals and appointments  In addition, I have reviewed and discussed with patient certain preventive protocols, quality metrics, and best practice recommendations. A written personalized care plan for preventive services as well as general preventive health recommendations were provided to patient.     Sandrea Hammond, LPN   7/67/2094   Nurse Notes: 6CIT score = 12 CRR for transportation assistance

## 2021-03-08 NOTE — Patient Instructions (Signed)
Erica Crosby , Thank you for taking time to come for your Medicare Wellness Visit. I appreciate your ongoing commitment to your health goals. Please review the following plan we discussed and let me know if I can assist you in the future.   Screening recommendations/referrals: Colonoscopy: Done 08/14/2016 - no repeat required Mammogram: Done 03/16/2020 - Repeat annually Bone Density: Done 12/02/2019 - Repeat every 2 years  Recommended yearly ophthalmology/optometry visit for glaucoma screening and checkup Recommended yearly dental visit for hygiene and checkup  Vaccinations: Influenza vaccine: Done 03/02/2021 - Repeat annually Pneumococcal vaccine: Done 11/4/217 & 01/03/2018 Tdap vaccine: Done 11/05/2009 - Repeat in 10 years  Shingles vaccine: Done 01/03/2018 & 03/11/2018   Covid-19: Done 07/24/19, 08/21/19, 05/11/2020  Advanced directives: Please bring a copy of your health care power of attorney and living will to the office to be added to your chart at your convenience.   Conditions/risks identified: Aim for 30 minutes of exercise or brisk walking each day, drink 6-8 glasses of water and eat lots of fruits and vegetables.   Next appointment: Follow up in one year for your annual wellness visit    Preventive Care 65 Years and Older, Female Preventive care refers to lifestyle choices and visits with your health care provider that can promote health and wellness. What does preventive care include? A yearly physical exam. This is also called an annual well check. Dental exams once or twice a year. Routine eye exams. Ask your health care provider how often you should have your eyes checked. Personal lifestyle choices, including: Daily care of your teeth and gums. Regular physical activity. Eating a healthy diet. Avoiding tobacco and drug use. Limiting alcohol use. Practicing safe sex. Taking low-dose aspirin every day. Taking vitamin and mineral supplements as recommended by your health care  provider. What happens during an annual well check? The services and screenings done by your health care provider during your annual well check will depend on your age, overall health, lifestyle risk factors, and family history of disease. Counseling  Your health care provider may ask you questions about your: Alcohol use. Tobacco use. Drug use. Emotional well-being. Home and relationship well-being. Sexual activity. Eating habits. History of falls. Memory and ability to understand (cognition). Work and work Statistician. Reproductive health. Screening  You may have the following tests or measurements: Height, weight, and BMI. Blood pressure. Lipid and cholesterol levels. These may be checked every 5 years, or more frequently if you are over 44 years old. Skin check. Lung cancer screening. You may have this screening every year starting at age 74 if you have a 30-pack-year history of smoking and currently smoke or have quit within the past 15 years. Fecal occult blood test (FOBT) of the stool. You may have this test every year starting at age 74. Flexible sigmoidoscopy or colonoscopy. You may have a sigmoidoscopy every 5 years or a colonoscopy every 10 years starting at age 74. Hepatitis C blood test. Hepatitis B blood test. Sexually transmitted disease (STD) testing. Diabetes screening. This is done by checking your blood sugar (glucose) after you have not eaten for a while (fasting). You may have this done every 1-3 years. Bone density scan. This is done to screen for osteoporosis. You may have this done starting at age 74. Mammogram. This may be done every 1-2 years. Talk to your health care provider about how often you should have regular mammograms. Talk with your health care provider about your test results, treatment options, and if  necessary, the need for more tests. Vaccines  Your health care provider may recommend certain vaccines, such as: Influenza vaccine. This is  recommended every year. Tetanus, diphtheria, and acellular pertussis (Tdap, Td) vaccine. You may need a Td booster every 10 years. Zoster vaccine. You may need this after age 74. Pneumococcal 13-valent conjugate (PCV13) vaccine. One dose is recommended after age 74. Pneumococcal polysaccharide (PPSV23) vaccine. One dose is recommended after age 74. Talk to your health care provider about which screenings and vaccines you need and how often you need them. This information is not intended to replace advice given to you by your health care provider. Make sure you discuss any questions you have with your health care provider. Document Released: 07/01/2015 Document Revised: 02/22/2016 Document Reviewed: 04/05/2015 Elsevier Interactive Patient Education  2017 [place] Prevention in the Home Falls can cause injuries. They can happen to people of all ages. There are many things you can do to make your home safe and to help prevent falls. What can I do on the outside of my home? Regularly fix the edges of walkways and driveways and fix any cracks. Remove anything that might make you trip as you walk through a door, such as a raised step or threshold. Trim any bushes or trees on the path to your home. Use bright outdoor lighting. Clear any walking paths of anything that might make someone trip, such as rocks or tools. Regularly check to see if handrails are loose or broken. Make sure that both sides of any steps have handrails. Any raised decks and porches should have guardrails on the edges. Have any leaves, snow, or ice cleared regularly. Use sand or salt on walking paths during winter. Clean up any spills in your garage right away. This includes oil or grease spills. What can I do in the bathroom? Use night lights. Install grab bars by the toilet and in the tub and shower. Do not use towel bars as grab bars. Use non-skid mats or decals in the tub or shower. If you need to sit down in  the shower, use a plastic, non-slip stool. Keep the floor dry. Clean up any water that spills on the floor as soon as it happens. Remove soap buildup in the tub or shower regularly. Attach bath mats securely with double-sided non-slip rug tape. Do not have throw rugs and other things on the floor that can make you trip. What can I do in the bedroom? Use night lights. Make sure that you have a light by your bed that is easy to reach. Do not use any sheets or blankets that are too big for your bed. They should not hang down onto the floor. Have a firm chair that has side arms. You can use this for support while you get dressed. Do not have throw rugs and other things on the floor that can make you trip. What can I do in the kitchen? Clean up any spills right away. Avoid walking on wet floors. Keep items that you use a lot in easy-to-reach places. If you need to reach something above you, use a strong step stool that has a grab bar. Keep electrical cords out of the way. Do not use floor polish or wax that makes floors slippery. If you must use wax, use non-skid floor wax. Do not have throw rugs and other things on the floor that can make you trip. What can I do with my stairs? Do not leave any items  on the stairs. Make sure that there are handrails on both sides of the stairs and use them. Fix handrails that are broken or loose. Make sure that handrails are as long as the stairways. Check any carpeting to make sure that it is firmly attached to the stairs. Fix any carpet that is loose or worn. Avoid having throw rugs at the top or bottom of the stairs. If you do have throw rugs, attach them to the floor with carpet tape. Make sure that you have a light switch at the top of the stairs and the bottom of the stairs. If you do not have them, ask someone to add them for you. What else can I do to help prevent falls? Wear shoes that: Do not have high heels. Have rubber bottoms. Are comfortable  and fit you well. Are closed at the toe. Do not wear sandals. If you use a stepladder: Make sure that it is fully opened. Do not climb a closed stepladder. Make sure that both sides of the stepladder are locked into place. Ask someone to hold it for you, if possible. Clearly mark and make sure that you can see: Any grab bars or handrails. First and last steps. Where the edge of each step is. Use tools that help you move around (mobility aids) if they are needed. These include: Canes. Walkers. Scooters. Crutches. Turn on the lights when you go into a dark area. Replace any light bulbs as soon as they burn out. Set up your furniture so you have a clear path. Avoid moving your furniture around. If any of your floors are uneven, fix them. If there are any pets around you, be aware of where they are. Review your medicines with your doctor. Some medicines can make you feel dizzy. This can increase your chance of falling. Ask your doctor what other things that you can do to help prevent falls. This information is not intended to replace advice given to you by your health care provider. Make sure you discuss any questions you have with your health care provider. Document Released: 03/31/2009 Document Revised: 11/10/2015 Document Reviewed: 07/09/2014 Elsevier Interactive Patient Education  2017 [employer].

## 2021-03-09 DIAGNOSIS — E1142 Type 2 diabetes mellitus with diabetic polyneuropathy: Secondary | ICD-10-CM | POA: Diagnosis not present

## 2021-03-09 DIAGNOSIS — B351 Tinea unguium: Secondary | ICD-10-CM | POA: Diagnosis not present

## 2021-03-09 DIAGNOSIS — M79676 Pain in unspecified toe(s): Secondary | ICD-10-CM | POA: Diagnosis not present

## 2021-03-09 DIAGNOSIS — L84 Corns and callosities: Secondary | ICD-10-CM | POA: Diagnosis not present

## 2021-03-15 ENCOUNTER — Telehealth: Payer: Medicare Other

## 2021-03-15 ENCOUNTER — Other Ambulatory Visit: Payer: Self-pay | Admitting: Family Medicine

## 2021-03-15 NOTE — Telephone Encounter (Addendum)
Erica Crosby informed, refill will have to come from Dr. Dossie Der, they will put a note on her delivery that she will have to make an appointment with him.  I tried to call pt as well, she was not available and does not have voicemail.

## 2021-03-15 NOTE — Telephone Encounter (Signed)
TC from [facility] if pt is still on the Leflunomide They do her pillpack and it is scheduled to be delivered tomorrow. They called her, since they got a NTBS from Dr. Dossie Der and she said she does not see him anymore Please advise

## 2021-03-15 NOTE — Telephone Encounter (Signed)
This is a specialty drug and HAS to come from a rheumatologist.

## 2021-03-17 DIAGNOSIS — E1169 Type 2 diabetes mellitus with other specified complication: Secondary | ICD-10-CM | POA: Diagnosis not present

## 2021-03-17 DIAGNOSIS — M069 Rheumatoid arthritis, unspecified: Secondary | ICD-10-CM | POA: Diagnosis not present

## 2021-03-17 DIAGNOSIS — Z794 Long term (current) use of insulin: Secondary | ICD-10-CM | POA: Diagnosis not present

## 2021-03-17 DIAGNOSIS — N184 Chronic kidney disease, stage 4 (severe): Secondary | ICD-10-CM

## 2021-03-17 DIAGNOSIS — I129 Hypertensive chronic kidney disease with stage 1 through stage 4 chronic kidney disease, or unspecified chronic kidney disease: Secondary | ICD-10-CM | POA: Diagnosis not present

## 2021-03-17 DIAGNOSIS — E1122 Type 2 diabetes mellitus with diabetic chronic kidney disease: Secondary | ICD-10-CM

## 2021-03-17 DIAGNOSIS — E785 Hyperlipidemia, unspecified: Secondary | ICD-10-CM

## 2021-03-29 ENCOUNTER — Ambulatory Visit (INDEPENDENT_AMBULATORY_CARE_PROVIDER_SITE_OTHER): Payer: Medicare Other | Admitting: *Deleted

## 2021-03-29 DIAGNOSIS — Z794 Long term (current) use of insulin: Secondary | ICD-10-CM

## 2021-03-29 DIAGNOSIS — I129 Hypertensive chronic kidney disease with stage 1 through stage 4 chronic kidney disease, or unspecified chronic kidney disease: Secondary | ICD-10-CM

## 2021-03-29 DIAGNOSIS — E1122 Type 2 diabetes mellitus with diabetic chronic kidney disease: Secondary | ICD-10-CM

## 2021-03-29 NOTE — Chronic Care Management (AMB) (Signed)
Chronic Care Management   CCM RN Visit Note  03/29/2021 Name: Erica Crosby MRN: [id number] DOB: [birth year]  Subjective: Erica Crosby is a 75 y.o. year old female who is a primary care patient of [name], DO. The care management team was consulted for assistance with disease management and care coordination needs.    Engaged with patient by telephone for follow up visit in response to provider referral for case management and/or care coordination services.   Consent to Services:  The patient was given information about Chronic Care Management services, agreed to services, and gave verbal consent prior to initiation of services.  Please see initial visit note for detailed documentation.   Patient agreed to services and verbal consent obtained.   Assessment: Review of patient past medical history, allergies, medications, health status, including review of consultants reports, laboratory and other test data, was performed as part of comprehensive evaluation and provision of chronic care management services.   SDOH (Social Determinants of Health) assessments and interventions performed:    CCM Care Plan  No Known Allergies  Outpatient Encounter Medications as of 03/29/2021  Medication Sig   Accu-Chek Softclix Lancets lancets CHECK BLOOD SUGAR TWICE A DAY AND AS NEEDED Dx E11.21   Adalimumab (HUMIRA PEN) 40 MG/0.4ML PNKT Inject 40 mg into the skin every 14 (fourteen) days. Can restart 06/04/19   aspirin EC 81 MG tablet Take 81 mg by mouth daily.   atorvastatin (LIPITOR) 10 MG tablet Take 1 tablet (10 mg total) by mouth daily.   Blood Glucose Monitoring Suppl (ACCU-CHEK AVIVA PLUS) w/Device KIT Test BS BID and prn Dx E11.21   carvedilol (COREG) 12.5 MG tablet Take 1 tablet (12.5 mg total) by mouth 2 (two) times daily with a meal.   ciprofloxacin (CIPRO) 500 MG tablet Take 500 mg by mouth daily.   enalapril (VASOTEC) 20 MG tablet 1 tablet   folic acid (FOLVITE) 1 MG tablet  Take 1 tablet (1 mg total) by mouth daily.   gabapentin (NEURONTIN) 400 MG capsule Take 1 capsule (400 mg total) by mouth at bedtime.   glucose blood (ACCU-CHEK AVIVA PLUS) test strip CHECK BLOOD SUGAR 2 TIMES A DAY AND AS NEEDED Dx E11.21   insulin glargine (LANTUS SOLOSTAR) 100 UNIT/ML Solostar Pen Inject 5-10 Units into the skin at bedtime.   leflunomide (ARAVA) 20 MG tablet Take 1 tablet (20 mg total) by mouth daily. Can restart 06/04/19   Omega-3 Fatty Acids (FISH OIL) 1000 MG CAPS    OPTIMAL-D 1.25 MG (50000 UT) capsule TAKE 1 CAPSULE BY MOUTH ONCE A WEEK FOR 8 DOSES   pantoprazole (PROTONIX) 40 MG tablet Take 1 tablet (40 mg total) by mouth daily.   No facility-administered encounter medications on file as of 03/29/2021.    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   Pneumonia due to COVID-19 virus 05/21/2019   Hyperlipidemia associated with type 2 diabetes mellitus ([place]) 07/07/2018   Noncompliance with medication regimen 07/07/2018   Palpitations 05/07/2018   RA (rheumatoid arthritis) ([place]) 03/20/2017   Mixed hyperlipidemia 02/13/2017   Hypertension associated with stage 3 chronic kidney disease due to type 2 diabetes mellitus ([place]) 12/24/2016   Personal history of noncompliance with medical treatment, presenting hazards to health 12/24/2016   Bulging lumbar disc 08/22/2016   Syncope 02/10/2012   Ataxia 02/10/2012   Uncontrolled type 2 diabetes  mellitus with diabetic nephropathy, with long-term current use of insulin 02/10/2012    Conditions to be addressed/monitored:HTN, DMII, and CKD Stage 4  Care Plan : [place]  Updates made by Ilean China, RN since 03/29/2021 12:00 AM     Problem: Chronic Disease Management Needs   Priority: Medium     Long-Range Goal: Work with [place] with  diabetes and CKD   This Visit's Progress: On track  Recent Progress: On track  Priority: Medium  Note:   Current Barriers:  Chronic Disease Management support and education needs related to DMII and CKD Stage 3 Transportation barriers  RNCM Clinical Goal(s):  Patient will take all medications exactly as prescribed and will call provider for medication related questions continue to work with RN Care Manager to address care management and care coordination needs related to DMII and CKD Stage 3  work with pharmacist to address medication management related to DMII through collaboration with RN Care manager, provider, and care team.   Interventions: 1:1 collaboration with primary care provider regarding development and update of comprehensive plan of care as evidenced by provider attestation and co-signature Inter-disciplinary care team collaboration (see longitudinal plan of care) Evaluation of current treatment plan related to  self management and patient's adherence to plan as established by provider   Chronic Kidney Disease (Status: Goal on track: YES.)  Last practice recorded BP readings:  Lab Results  Component Value Date   CREATININE 2.41 (H) 10/14/2020   BUN 34 (H) 10/14/2020   NA 140 10/14/2020   K 4.6 10/14/2020   CL 105 10/14/2020   CO2 22 10/14/2020   Evaluation of current treatment plan related to chronic kidney disease self management and patient's adherence to plan as established by provider      Reviewed medications with patient and discussed importance of compliance    Advised patient, providing education and rationale, to monitor blood pressure daily and record, calling PCP for findings outside established parameters    Discussed complications of poorly controlled blood pressure such as heart disease, stroke, circulatory complications, vision complications, kidney impairment, sexual dysfunction    Reviewed scheduled/upcoming provider appointments including     Discussed plans with patient for ongoing care management follow up and provided patient with direct contact information for care management team    Assessed social determinant of health barriers      Diabetes:  (Status: Goal on track: YES.) Lab Results  Component Value Date   HGBA1C 6.0 10/14/2020   HGBA1C 5.9 06/15/2020   HGBA1C 6.1 12/09/2019   Lab Results  Component Value Date   LDLCALC 77 10/14/2020   CREATININE 2.41 (H) 10/14/2020  Assessed patient's understanding of A1c goal: <7% Reviewed medications with patient and discussed importance of medication adherence;        Counseled on importance of regular laboratory monitoring as prescribed;        Discussed plans with patient for ongoing care management follow up and provided patient with direct contact information for care management team;      Reviewed scheduled/upcoming provider appointments including: 03/03/21 telephone visit with Lottie Dawson, PharmD;         Advised patient, providing education and rationale, to check cbg continuously with CGM and record        call provider for findings outside established parameters;       Review of patient status, including review of consultants reports, relevant laboratory and other test results, and  medications completed;       Discussed home blood sugar readings and use of [name] for continuous monitoring. Has been checking with fingersticks as well to compare the two and Elenor Legato seems accurate.  Verbal education provided on how freestyle Elenor Legato works and that it is checking the glucose level in the interstitial fluid and not in the blood and may vary some from fingersticks. There is a 5 to 10 minute delay in ISF glucose response to changes in blood glucose but that the sensor will pickup on whether the blood sugar is increasing, decreasing, or remaining stable. Discussed diet and appetite. Encouraged to eat meals at regular intervals and to avoid sugar and simple carbs Reviewed  upcoming appointments   Hypertension: (Status: Goal on track: YES.) Last practice recorded BP readings:  BP Readings from Last 3 Encounters:  10/14/20 135/69  06/15/20 138/85  12/09/19 (!) 148/67  Most recent eGFR/CrCl:  Lab Results  Component Value Date   EGFR 21 (L) 10/14/2020    No components found for: CRCL  Evaluation of current treatment plan related to hypertension self management and patient's adherence to plan as established by provider;   Reviewed medications with patient and discussed importance of compliance;  Advised patient, providing education and rationale, to monitor blood pressure daily and record, calling PCP for findings outside established parameters;  Discussed complications of poorly controlled blood pressure such as heart disease, stroke, circulatory complications, vision complications, kidney impairment, sexual dysfunction;  Discussed which OTC cough/cold meds are appropriate. Recommended plain mucinex and sugar free cough drops   Patient Goals/Self-Care Activities: Patient will self administer medications as prescribed Patient will call pharmacy for medication refills Patient will continue to perform ADL's independently Patient will call provider office for new concerns or questions       Plan:Telephone follow up appointment with care management team member scheduled for:  05/04/21 with RNCM and The patient has been provided with contact information for the care management team and has been advised to call with any health related questions or concerns.   Chong Sicilian, BSN, RN-BC Embedded Chronic Care Manager [facility] Direct Dial: [phone]

## 2021-03-29 NOTE — Patient Instructions (Signed)
Visit Information  PATIENT GOALS:  Goals Addressed             This Visit's Progress    RNCM: Monitor and Manage Blood Pressure       Timeframe:  Long-Range Goal Priority:  Medium Start Date:                             Expected End Date:                        Follow-up: 05/04/21  Check and record blood pressure daily Call PCP with any readings outside of recommended range Take medication as prescribed Watch sodium intake and don't add extra salt to food Call [facility] as needed [phone]      RNCM: Monitor and Manage My Blood Sugar-Diabetes Type 2   On track    Timeframe:  Long-Range Goal Priority:  High Start Date:                             Expected End Date:                       Follow Up Date 05/04/21   Take medications as prescribed Use freestyle libre to check blood sugar continuously and as needed  Keep all medical appointments Monitor for signs of hyper and hypoglycemia Call PCP at [phone] if blood sugar readings are below 70 or above 300 Reach out to [facility] as needed [phone] Eat meals at regular intervals. Avoid sugar and simple carbs   Why is this important?   Checking your blood sugar at home helps to keep it from getting very high or very low.  Writing the results in a diary or log helps the doctor know how to care for you.  Your blood sugar log should have the time, date and the results.  Also, write down the amount of insulin or other medicine that you take.  Other information, like what you ate, exercise done and how you were feeling, will also be helpful.     Notes:      [place]: Follow My Treatment Plan-Chronic Kidney   On track    Timeframe:  Long-Range Goal Priority:  Low Start Date: 07/22/20                            Expected End Date: 01/19/21                      Follow Up Date 05/04/21   Take all medication as prescribed Call nephrologist or PCP if you have any questions or concerns Keep all follow-up  appointments with nephrologist and PCP Manage blood pressure and blood sugar by checking them regularly and taking your medication Call PCP if you blood pressure or blood sugar is running higher than recommended  Call RN Care Manager as needed [phone]    Why is this important?   Staying as healthy as you can is very important. This may mean making changes if you smoke, don't exercise or eat poorly.  A healthy lifestyle is an important goal for you.  Following the treatment plan and making changes may be hard.  Try some of these steps to help keep the disease from getting worse.     Notes:  Patient verbalizes understanding of instructions provided today and agrees to view in [place].   Telephone follow up appointment with care management team member scheduled for: 05/04/21 with RNCM  Erica Crosby, Erica Crosby, Erica Crosby [place] [facility] [place] Management Direct Dial: [phone]

## 2021-04-03 DIAGNOSIS — E1165 Type 2 diabetes mellitus with hyperglycemia: Secondary | ICD-10-CM | POA: Diagnosis not present

## 2021-04-05 ENCOUNTER — Telehealth: Payer: Medicare Other

## 2021-04-17 DIAGNOSIS — N184 Chronic kidney disease, stage 4 (severe): Secondary | ICD-10-CM | POA: Diagnosis not present

## 2021-04-17 DIAGNOSIS — I129 Hypertensive chronic kidney disease with stage 1 through stage 4 chronic kidney disease, or unspecified chronic kidney disease: Secondary | ICD-10-CM | POA: Diagnosis not present

## 2021-04-17 DIAGNOSIS — E1122 Type 2 diabetes mellitus with diabetic chronic kidney disease: Secondary | ICD-10-CM | POA: Diagnosis not present

## 2021-04-17 DIAGNOSIS — Z794 Long term (current) use of insulin: Secondary | ICD-10-CM

## 2021-04-19 ENCOUNTER — Telehealth: Payer: Medicare Other

## 2021-05-03 ENCOUNTER — Telehealth: Payer: Medicare Other

## 2021-05-04 ENCOUNTER — Ambulatory Visit (INDEPENDENT_AMBULATORY_CARE_PROVIDER_SITE_OTHER): Payer: Medicare Other | Admitting: *Deleted

## 2021-05-04 DIAGNOSIS — N184 Chronic kidney disease, stage 4 (severe): Secondary | ICD-10-CM

## 2021-05-04 DIAGNOSIS — R7309 Other abnormal glucose: Secondary | ICD-10-CM

## 2021-05-04 DIAGNOSIS — E1165 Type 2 diabetes mellitus with hyperglycemia: Secondary | ICD-10-CM | POA: Diagnosis not present

## 2021-05-04 NOTE — Chronic Care Management (AMB) (Addendum)
Chronic Care Management   CCM RN Visit Note  05/04/2021 Name: Erica Crosby MRN: [id number] DOB: Jun 07, 1946  Subjective: Erica Crosby is a 75 y.o. year old female who is a primary care patient of [name], DO. The care management team was consulted for assistance with disease management and care coordination needs.    Engaged with patient by telephone for follow up visit in response to provider referral for case management and/or care coordination services.   Consent to Services:  The patient was given information about Chronic Care Management services, agreed to services, and gave verbal consent prior to initiation of services.  Please see initial visit note for detailed documentation.   Patient agreed to services and verbal consent obtained.   Assessment: Review of patient past medical history, allergies, medications, health status, including review of consultants reports, laboratory and other test data, was performed as part of comprehensive evaluation and provision of chronic care management services.   SDOH (Social Determinants of Health) assessments and interventions performed:    CCM Care Plan  No Known Allergies  Outpatient Encounter Medications as of 05/04/2021  Medication Sig   Accu-Chek Softclix Lancets lancets CHECK BLOOD SUGAR TWICE A DAY AND AS NEEDED Dx E11.21   Adalimumab (HUMIRA PEN) 40 MG/0.4ML PNKT Inject 40 mg into the skin every 14 (fourteen) days. Can restart 06/04/19   aspirin EC 81 MG tablet Take 81 mg by mouth daily.   atorvastatin (LIPITOR) 10 MG tablet Take 1 tablet (10 mg total) by mouth daily.   Blood Glucose Monitoring Suppl (ACCU-CHEK AVIVA PLUS) w/Device KIT Test BS BID and prn Dx E11.21   carvedilol (COREG) 12.5 MG tablet Take 1 tablet (12.5 mg total) by mouth 2 (two) times daily with a meal.   ciprofloxacin (CIPRO) 500 MG tablet Take 500 mg by mouth daily.   enalapril (VASOTEC) 20 MG tablet 1 tablet   folic acid (FOLVITE) 1 MG tablet  Take 1 tablet (1 mg total) by mouth daily.   gabapentin (NEURONTIN) 400 MG capsule Take 1 capsule (400 mg total) by mouth at bedtime.   glucose blood (ACCU-CHEK AVIVA PLUS) test strip CHECK BLOOD SUGAR 2 TIMES A DAY AND AS NEEDED Dx E11.21   insulin glargine (LANTUS SOLOSTAR) 100 UNIT/ML Solostar Pen Inject 5-10 Units into the skin at bedtime.   leflunomide (ARAVA) 20 MG tablet Take 1 tablet (20 mg total) by mouth daily. Can restart 06/04/19   Omega-3 Fatty Acids (FISH OIL) 1000 MG CAPS    OPTIMAL-D 1.25 MG (50000 UT) capsule TAKE 1 CAPSULE BY MOUTH ONCE A WEEK FOR 8 DOSES   pantoprazole (PROTONIX) 40 MG tablet Take 1 tablet (40 mg total) by mouth daily.   No facility-administered encounter medications on file as of 05/04/2021.    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   Pneumonia due to COVID-19 virus 05/21/2019   Hyperlipidemia associated with type 2 diabetes mellitus ([place]) 07/07/2018   Noncompliance with medication regimen 07/07/2018   Palpitations 05/07/2018   RA (rheumatoid arthritis) ([place]) 03/20/2017   Mixed hyperlipidemia 02/13/2017   Hypertension associated with stage 3 chronic kidney disease due to type 2 diabetes mellitus ([place]) 12/24/2016   Personal history of noncompliance with medical treatment, presenting hazards to health 12/24/2016   Bulging lumbar disc 08/22/2016   Syncope 02/10/2012   Ataxia 02/10/2012   Uncontrolled type 2 diabetes  mellitus with diabetic nephropathy, with long-term current use of insulin 02/10/2012    Conditions to be addressed/monitored:HTN, DMII, and CKD Stage 3  Care Plan : [place]  Updates made by Ilean China, RN since 05/04/2021 12:00 AM     Problem: Chronic Disease Management Needs   Priority: Medium     Long-Range Goal: Work with [place] with  diabetes and CKD   This Visit's Progress: On track  Recent Progress: On track  Priority: High  Note:   Current Barriers:  Chronic Disease Management support and education needs related to DMII and CKD Stage 3 Transportation barriers  RNCM Clinical Goal(s):  Patient will take all medications exactly as prescribed and will call provider for medication related questions continue to work with RN Care Manager to address care management and care coordination needs related to DMII and CKD Stage 3  work with pharmacist to address medication management related to DMII through collaboration with RN Care manager, provider, and care team.   Interventions: 1:1 collaboration with primary care provider regarding development and update of comprehensive plan of care as evidenced by provider attestation and co-signature Inter-disciplinary care team collaboration (see longitudinal plan of care) Evaluation of current treatment plan related to  self management and patient's adherence to plan as established by provider   Chronic Kidney Disease (Status: Condition stable. Not addressed this visit.)  Last practice recorded BP readings:  Lab Results  Component Value Date   CREATININE 2.41 (H) 10/14/2020   BUN 34 (H) 10/14/2020   NA 140 10/14/2020   K 4.6 10/14/2020   CL 105 10/14/2020   CO2 22 10/14/2020   Evaluation of current treatment plan related to chronic kidney disease self management and patient's adherence to plan as established by provider      Reviewed medications with patient and discussed importance of compliance    Advised patient, providing education and rationale, to monitor blood pressure daily and record, calling PCP for findings outside established parameters    Discussed complications of poorly controlled blood pressure such as heart disease, stroke, circulatory complications, vision complications, kidney impairment, sexual dysfunction    Reviewed scheduled/upcoming provider appointments  including    Discussed plans with patient for ongoing care management follow up and provided patient with direct contact information for care management team    Assessed social determinant of health barriers      Diabetes:  (Status: Goal on track: NO.) Lab Results  Component Value Date   HGBA1C 6.0 10/14/2020   HGBA1C 5.9 06/15/2020   HGBA1C 6.1 12/09/2019   Lab Results  Component Value Date   LDLCALC 77 10/14/2020   CREATININE 2.41 (H) 10/14/2020  Assessed patient's understanding of A1c goal: <7% Counseled on importance of regular laboratory monitoring as prescribed;        Discussed plans with patient for ongoing care management follow up and provided patient with direct contact information for care management team;      Advised patient, providing education and rationale, to check cbg continuously with CGM and record        call provider for findings outside established parameters;       Review of patient status, including review of consultants reports, relevant laboratory and other test results, and medications completed;       Discussed diet and appetite. Encouraged to eat meals at regular intervals to avoid low blood sugar and to avoid sugar and simple carbs Discussed recent fall due  to low blood sugar. Patient fell and struck her head on her coffee table. Continues to have head pain. Going to Urgent Care today for initial evaluation after fall.  Discussed use of lantus. Prescribed 5-10 units at bedtime. Daughter doesn't think she's eating enough. Will need to discuss further and may need to d/c insulin. Will collaborate with PharmD and PCP Scheduled follow-up RN call for 05/08/21 Reviewed upcoming appointments   Hypertension: (Status: Condition stable. Not addressed this visit.) Last practice recorded BP readings:  BP Readings from Last 3 Encounters:  10/14/20 135/69  06/15/20 138/85  12/09/19 (!) 148/67  Most recent eGFR/CrCl:  Lab Results  Component Value Date   EGFR 21  (L) 10/14/2020    No components found for: CRCL  Evaluation of current treatment plan related to hypertension self management and patient's adherence to plan as established by provider;   Reviewed medications with patient and discussed importance of compliance;  Advised patient, providing education and rationale, to monitor blood pressure daily and record, calling PCP for findings outside established parameters;  Discussed complications of poorly controlled blood pressure such as heart disease, stroke, circulatory complications, vision complications, kidney impairment, sexual dysfunction;  Discussed which OTC cough/cold meds are appropriate. Recommended plain mucinex and sugar free cough drops    Falls:  (Status: New goal.) Long Term Goal  Provided written and verbal education re: potential causes of falls and Fall prevention strategies Reviewed medications and discussed potential side effects of medications such as dizziness and frequent urination Advised patient of importance of notifying provider of falls Assessed for falls since last encounter Provided patient information for fall alert systems Advised patient to discuss insulin dosage and hypoglycemic episodes with provider Prepared written educational materials on [facility] which is covered by [facility]. To be mailed to patient by [place] office staff.     Patient Goals/Self-Care Activities: Patient will self administer medications as prescribed Patient will call pharmacy for medication refills Patient will continue to perform ADL's independently Patient will call provider office for new concerns or questions Eat meals at regular intervals to avoid low blood sugar Check blood sugar daily and as needed Bring Libre meter to office visits for review Keep a log of insulin usage and blood sugar levels Bring log to PCP appointments Call PCP if blood sugar continues to run "Low" Seek appropriate medical attention after any  falls Follow-up on obtaining [place] system    Plan:Telephone follow up appointment with care management team member scheduled for:  05/08/21 with [facility] The patient has been provided with contact information for the care management team and has been advised to call with any health related questions or concerns.   Chong Sicilian, BSN, RN-BC Embedded Chronic Care Manager [facility] / [place] [facility] Direct Dial: [phone]

## 2021-05-04 NOTE — Patient Instructions (Signed)
Visit Information  Patient Goals/Self-Care Activities: Patient will self administer medications as prescribed Patient will call pharmacy for medication refills Patient will continue to perform ADL's independently Patient will call provider office for new concerns or questions Eat meals at regular intervals to avoid low blood sugar Check blood sugar daily and as needed Bring Libre meter to office visits for review Keep a log of insulin usage and blood sugar levels Bring log to PCP appointments Call PCP if blood sugar continues to run "Low" Seek appropriate medical attention after any falls  Patient verbalizes understanding of instructions provided today and agrees to view in [place].   Plan:Telephone follow up appointment with care management team member scheduled for:  05/08/21 with RNCM The patient has been provided with contact information for the care management team and has been advised to call with any health related questions or concerns.   Chong Sicilian, BSN, RN-BC Embedded Chronic Care Manager [facility] / [place] [facility] Direct Dial: [phone]

## 2021-05-05 ENCOUNTER — Emergency Department (HOSPITAL_COMMUNITY): Payer: Medicare Other

## 2021-05-05 ENCOUNTER — Encounter (HOSPITAL_COMMUNITY): Payer: Self-pay | Admitting: *Deleted

## 2021-05-05 ENCOUNTER — Emergency Department (HOSPITAL_COMMUNITY)
Admission: EM | Admit: 2021-05-05 | Discharge: 2021-05-05 | Disposition: A | Discharge status: Home / Self Care | Payer: Medicare Other | Attending: Emergency Medicine | Admitting: Emergency Medicine

## 2021-05-05 ENCOUNTER — Other Ambulatory Visit: Payer: Self-pay

## 2021-05-05 DIAGNOSIS — J439 Emphysema, unspecified: Secondary | ICD-10-CM | POA: Diagnosis not present

## 2021-05-05 DIAGNOSIS — N183 Chronic kidney disease, stage 3 unspecified: Secondary | ICD-10-CM | POA: Insufficient documentation

## 2021-05-05 DIAGNOSIS — E049 Nontoxic goiter, unspecified: Secondary | ICD-10-CM | POA: Diagnosis not present

## 2021-05-05 DIAGNOSIS — M4802 Spinal stenosis, cervical region: Secondary | ICD-10-CM | POA: Diagnosis not present

## 2021-05-05 DIAGNOSIS — Z7984 Long term (current) use of oral hypoglycemic drugs: Secondary | ICD-10-CM | POA: Diagnosis not present

## 2021-05-05 DIAGNOSIS — I2510 Atherosclerotic heart disease of native coronary artery without angina pectoris: Secondary | ICD-10-CM | POA: Diagnosis not present

## 2021-05-05 DIAGNOSIS — W01198A Fall on same level from slipping, tripping and stumbling with subsequent striking against other object, initial encounter: Secondary | ICD-10-CM | POA: Insufficient documentation

## 2021-05-05 DIAGNOSIS — S199XXA Unspecified injury of neck, initial encounter: Secondary | ICD-10-CM | POA: Diagnosis not present

## 2021-05-05 DIAGNOSIS — Z79899 Other long term (current) drug therapy: Secondary | ICD-10-CM | POA: Diagnosis not present

## 2021-05-05 DIAGNOSIS — R519 Headache, unspecified: Secondary | ICD-10-CM | POA: Diagnosis not present

## 2021-05-05 DIAGNOSIS — Z8616 Personal history of COVID-19: Secondary | ICD-10-CM | POA: Insufficient documentation

## 2021-05-05 DIAGNOSIS — Z7982 Long term (current) use of aspirin: Secondary | ICD-10-CM | POA: Insufficient documentation

## 2021-05-05 DIAGNOSIS — Z87891 Personal history of nicotine dependence: Secondary | ICD-10-CM | POA: Diagnosis not present

## 2021-05-05 DIAGNOSIS — S0990XA Unspecified injury of head, initial encounter: Secondary | ICD-10-CM | POA: Diagnosis not present

## 2021-05-05 DIAGNOSIS — I1310 Hypertensive heart and chronic kidney disease without heart failure, with stage 1 through stage 4 chronic kidney disease, or unspecified chronic kidney disease: Secondary | ICD-10-CM | POA: Insufficient documentation

## 2021-05-05 DIAGNOSIS — I6529 Occlusion and stenosis of unspecified carotid artery: Secondary | ICD-10-CM | POA: Diagnosis not present

## 2021-05-05 LAB — URINALYSIS, ROUTINE W REFLEX MICROSCOPIC
Bilirubin Urine: NEGATIVE
Glucose, UA: NEGATIVE mg/dL
Ketones, ur: NEGATIVE mg/dL
Leukocytes,Ua: NEGATIVE
Nitrite: NEGATIVE
Protein, ur: 100 mg/dL — AB
Specific Gravity, Urine: 1.01 (ref 1.005–1.030)
pH: 5 (ref 5.0–8.0)

## 2021-05-05 LAB — COMPREHENSIVE METABOLIC PANEL
ALT: 20 U/L (ref 0–44)
AST: 21 U/L (ref 15–41)
Albumin: 3.4 g/dL — ABNORMAL LOW (ref 3.5–5.0)
Alkaline Phosphatase: 71 U/L (ref 38–126)
Anion gap: 6 (ref 5–15)
BUN: 57 mg/dL — ABNORMAL HIGH (ref 8–23)
CO2: 21 mmol/L — ABNORMAL LOW (ref 22–32)
Calcium: 8.4 mg/dL — ABNORMAL LOW (ref 8.9–10.3)
Chloride: 112 mmol/L — ABNORMAL HIGH (ref 98–111)
Creatinine, Ser: 2.73 mg/dL — ABNORMAL HIGH (ref 0.44–1.00)
GFR, Estimated: 18 mL/min — ABNORMAL LOW (ref >60)
Glucose, Bld: 139 mg/dL — ABNORMAL HIGH (ref 70–99)
Potassium: 4.6 mmol/L (ref 3.5–5.1)
Sodium: 139 mmol/L (ref 135–145)
Total Bilirubin: 0.6 mg/dL (ref 0.3–1.2)
Total Protein: 7.3 g/dL (ref 6.5–8.1)

## 2021-05-05 LAB — CBC WITH DIFFERENTIAL/PLATELET
Abs Immature Granulocytes: 0.02 10*3/uL (ref 0.00–0.07)
Basophils Absolute: 0.1 10*3/uL (ref 0.0–0.1)
Basophils Relative: 1 %
Eosinophils Absolute: 0.3 10*3/uL (ref 0.0–0.5)
Eosinophils Relative: 4 %
HCT: 29.7 % — ABNORMAL LOW (ref 36.0–46.0)
Hemoglobin: 9.3 g/dL — ABNORMAL LOW (ref 12.0–15.0)
Immature Granulocytes: 0 %
Lymphocytes Relative: 37 %
Lymphs Abs: 2.5 10*3/uL (ref 0.7–4.0)
MCH: 28.4 pg (ref 26.0–34.0)
MCHC: 31.3 g/dL (ref 30.0–36.0)
MCV: 90.8 fL (ref 80.0–100.0)
Monocytes Absolute: 0.5 10*3/uL (ref 0.1–1.0)
Monocytes Relative: 8 %
Neutro Abs: 3.3 10*3/uL (ref 1.7–7.7)
Neutrophils Relative %: 50 %
Platelets: 160 10*3/uL (ref 150–400)
RBC: 3.27 MIL/uL — ABNORMAL LOW (ref 3.87–5.11)
RDW: 14.6 % (ref 11.5–15.5)
WBC: 6.6 10*3/uL (ref 4.0–10.5)
nRBC: 0 % (ref 0.0–0.2)

## 2021-05-05 MED ORDER — SODIUM CHLORIDE 0.9 % IV BOLUS
1000.0000 mL | Freq: Once | INTRAVENOUS | Status: AC
  Administered 2021-05-05: 1000 mL via INTRAVENOUS

## 2021-05-05 MED ORDER — PREDNISONE 10 MG (21) PO TBPK: ORAL_TABLET | Freq: Every day | ORAL | 0 refills | Status: DC

## 2021-05-05 MED ORDER — KETOROLAC TROMETHAMINE 30 MG/ML IJ SOLN
30.0000 mg | Freq: Once | INTRAMUSCULAR | Status: AC
  Administered 2021-05-05: 30 mg via INTRAVENOUS
  Filled 2021-05-05: qty 1

## 2021-05-05 NOTE — ED Triage Notes (Signed)
Pt sent here from urgent Care for scan of head.  C/o head hurting.  Pt admits to fall on 10/26 hitting a coffee table.  C/o dull pain on left side. Denies any LOC, or blurry vision or N/V

## 2021-05-05 NOTE — ED Provider Notes (Signed)
[facility] EMERGENCY DEPARTMENT Provider Note   CSN: [id number] Arrival date & time: 05/05/21  1058     History Chief Complaint  Patient presents with   Erica Crosby is a 75 y.o. female.  Pt presents to the ED today with head and neck pain after a fall on 10/26.  Pt said she tripped and fell hitting her head against the coffee table.  She has continued to have pain on the left side of her head and neck since then.  She denies any n/v or blurry vision.  She initially went to UC who sent her here.  Pt denies any weakness in her arms and legs.      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   Pneumonia due to COVID-19 virus 05/21/2019   Hyperlipidemia associated with type 2 diabetes mellitus ([place]) 07/07/2018   Noncompliance with medication regimen 07/07/2018   Palpitations 05/07/2018   RA (rheumatoid arthritis) ([place]) 03/20/2017   Mixed hyperlipidemia 02/13/2017   Hypertension associated with stage 3 chronic kidney disease due to type 2 diabetes mellitus ([place]) 12/24/2016   Personal history of noncompliance with medical treatment, presenting hazards to health 12/24/2016   Bulging lumbar disc 08/22/2016   Syncope 02/10/2012   Ataxia 02/10/2012   Uncontrolled type 2 diabetes mellitus with diabetic nephropathy, with long-term current use of insulin 02/10/2012    Past Surgical History:  Procedure Laterality Date   ABSCESS DRAINAGE     CARPAL TUNNEL RELEASE Left    CHOLECYSTECTOMY      KNEE SURGERY Right    NECK SURGERY     x 2   SHOULDER SURGERY Left      OB History   No obstetric history on file.     Family History  Problem Relation Age of Onset   Heart disease Sister        Congeital (died age 53) with "enlarged heart"   CAD Sister    Diabetes Mother    Heart disease Mother        Later onset heart disease    Dementia Mother    Alcohol abuse Father    Liver disease Father    CAD Sister 59       CABG   Early death Sister    Diabetes Brother    Kidney disease Brother        related to DM   Diabetes Brother     Social History   Tobacco Use   Smoking status: Former    Years: 25.00    Types: Cigarettes, E-cigarettes    Quit date: 02/02/2013    Years since quitting: 8.2   Smokeless tobacco: Never  Vaping Use   Vaping Use: Never used  Substance Use Topics   Alcohol use: No   Drug use: No    Home Medications Prior to Admission medications   Medication Sig Start Date End  Date Taking? Authorizing Provider  predniSONE (STERAPRED UNI-PAK 21 TAB) 10 MG (21) TBPK tablet Take by mouth daily. Take 6 tabs by mouth daily  for 2 days, then 5 tabs for 2 days, then 4 tabs for 2 days, then 3 tabs for 2 days, 2 tabs for 2 days, then 1 tab by mouth daily for 2 days 05/05/21  Yes Isla Pence, MD  Accu-Chek Softclix Lancets lancets CHECK BLOOD SUGAR TWICE A DAY AND AS NEEDED Dx E11.21 06/06/20   Ronnie Doss M, DO  Adalimumab (HUMIRA PEN) 40 MG/0.4ML PNKT Inject 40 mg into the skin every 14 (fourteen) days. Can restart 06/04/19 05/25/19   Orson Mackinley, MD  aspirin EC 81 MG tablet Take 81 mg by mouth daily.    [provider]  atorvastatin (LIPITOR) 10 MG tablet Take 1 tablet (10 mg total) by mouth daily. 02/14/21   [name], DO  Blood Glucose Monitoring Suppl (ACCU-CHEK AVIVA PLUS) w/Device KIT Test BS BID and prn Dx E11.21 07/01/19   Ronnie Doss M, DO  carvedilol (COREG) 12.5 MG tablet Take 1 tablet (12.5 mg total) by mouth 2 (two)  times daily with a meal. 02/14/21   Ronnie Doss M, DO  ciprofloxacin (CIPRO) 500 MG tablet Take 500 mg by mouth daily. 03/06/21   [provider]  enalapril (VASOTEC) 20 MG tablet 1 tablet    [provider]  folic acid (FOLVITE) 1 MG tablet Take 1 tablet (1 mg total) by mouth daily. 02/14/21   [name], DO  gabapentin (NEURONTIN) 400 MG capsule Take 1 capsule (400 mg total) by mouth at bedtime. 02/14/21   [name], DO  glucose blood (ACCU-CHEK AVIVA PLUS) test strip CHECK BLOOD SUGAR 2 TIMES A DAY AND AS NEEDED Dx E11.21 02/27/21   Ronnie Doss M, DO  insulin glargine (LANTUS SOLOSTAR) 100 UNIT/ML Solostar Pen Inject 5-10 Units into the skin at bedtime. 12/31/19   [name], DO  leflunomide (ARAVA) 20 MG tablet Take 1 tablet (20 mg total) by mouth daily. Can restart 06/04/19 05/25/19   Orson Shaleka, MD  Omega-3 Fatty Acids (FISH OIL) 1000 MG CAPS     [provider]  OPTIMAL-D 1.25 MG (50000 UT) capsule TAKE 1 CAPSULE BY MOUTH ONCE A WEEK FOR 8 DOSES 05/26/19   Ronnie Doss M, DO  pantoprazole (PROTONIX) 40 MG tablet Take 1 tablet (40 mg total) by mouth daily. 02/14/21   [name], DO    Allergies    Patient has no known allergies.  Review of Systems   Review of Systems  Neurological:  Positive for headaches.  All other systems reviewed and are negative.  Physical Exam Updated Vital Signs BP (!) 161/80   Pulse 80   Temp 97.8 F (36.6 C)   Resp 17   Ht _0  (1.702 m)   Wt 60.2 kg   SpO2 99%   BMI 20.80 kg/m   Physical Exam Vitals and nursing note reviewed.  Constitutional:      Appearance: Normal appearance.  HENT:     Head: Normocephalic and atraumatic.     Right Ear: External ear normal.     Left Ear: External ear normal.     Nose: Nose normal.     Mouth/Throat:     Mouth: Mucous membranes are moist.     Pharynx: Oropharynx is clear.  Eyes:     Extraocular Movements: Extraocular movements  intact.     Conjunctiva/sclera: Conjunctivae normal.  Pupils: Pupils are equal, round, and reactive to light.  Neck:   Cardiovascular:     Rate and Rhythm: Normal rate and regular rhythm.  Pulmonary:     Effort: Pulmonary effort is normal.     Breath sounds: Normal breath sounds.  Abdominal:     General: Abdomen is flat. Bowel sounds are normal.     Palpations: Abdomen is soft.  Musculoskeletal:        General: Normal range of motion.  Skin:    General: Skin is warm.     Capillary Refill: Capillary refill takes less than 2 seconds.  Neurological:     General: No focal deficit present.     Mental Status: She is alert and oriented to person, place, and time.  Psychiatric:        Mood and Affect: Mood normal.        Behavior: Behavior normal.    ED Results / Procedures / Treatments   Labs (all labs ordered are listed, but only abnormal results are displayed) Labs Reviewed  COMPREHENSIVE METABOLIC PANEL - Abnormal; Notable for the following components:      Result Value   Chloride 112 (*)    CO2 21 (*)    Glucose, Bld 139 (*)    BUN 57 (*)    Creatinine, Ser 2.73 (*)    Calcium 8.4 (*)    Albumin 3.4 (*)    GFR, Estimated 18 (*)    All other components within normal limits  CBC WITH DIFFERENTIAL/PLATELET - Abnormal; Notable for the following components:   RBC 3.27 (*)    Hemoglobin 9.3 (*)    HCT 29.7 (*)    All other components within normal limits  URINALYSIS, ROUTINE W REFLEX MICROSCOPIC - Abnormal; Notable for the following components:   Hgb urine dipstick SMALL (*)    Protein, ur 100 (*)    Bacteria, UA RARE (*)    All other components within normal limits    EKG None  Radiology CT Head Wo Contrast  Result Date: 05/05/2021 CLINICAL DATA:  Head trauma, minor (Age >= 65y) EXAM: CT HEAD WITHOUT CONTRAST TECHNIQUE: Contiguous axial images were obtained from the base of the skull through the vertex without intravenous contrast. COMPARISON:  2013 FINDINGS:  Brain: There is no acute intracranial hemorrhage, mass effect, or edema. Gray-white differentiation is preserved. There is no extra-axial fluid collection. Prominence of the ventricles and sulci reflects parenchymal volume loss. Patchy and confluent hypoattenuation in the supratentorial white matter is nonspecific but may reflect moderate chronic microvascular ischemic changes. Vascular: There is atherosclerotic calcification at the skull base. Skull: Calvarium is unremarkable. Sinuses/Orbits: No acute finding. Other: None. IMPRESSION: No evidence of acute intracranial injury. Electronically Signed   By: Macy Mis M.D.   On: 05/05/2021 14:00   CT Cervical Spine Wo Contrast  Result Date: 05/05/2021 CLINICAL DATA:  Neck trauma (Age >= 65y) EXAM: CT CERVICAL SPINE WITHOUT CONTRAST TECHNIQUE: Multidetector CT imaging of the cervical spine was performed without intravenous contrast. Multiplanar CT image reconstructions were also generated. COMPARISON:  None. FINDINGS: Alignment: Trace retrolisthesis at C3-C4. Skull base and vertebrae: Postoperative changes of anterior fusion at C4-C6 with plate and screw fixation and well incorporated interbody grafts. There is no acute fracture. Soft tissues and spinal canal: No prevertebral fluid or swelling. No visible canal hematoma. Disc levels: Partially calcified disc bulge and central disc protrusion at C3-C4 with marked canal stenosis. Disc osteophyte complex at C6-C7 with mild canal stenosis. Bilateral foraminal  narrowing at both these levels. Upper chest: Partially calcified nodule along the pleura in the posterior right upper lobe is likely stable in comparison to report of CT chest from 2017. Emphysema. Other: Enlarged, heterogeneous thyroid. Calcified plaque at the common carotid bifurcations. IMPRESSION: No acute cervical spine fracture. Probable marked canal stenosis at C3-C4 above fusion. Partially calcified nodule of the posterior right upper lobe is likely  stable in comparison to 2017 chest CT report (images unavailable). Enlarged, heterogeneous thyroid. Ultrasound can be considered in the absence of significant comorbidities/limited life expectancy. Electronically Signed   By: Macy Mis M.D.   On: 05/05/2021 14:08    Procedures Procedures   Medications Ordered in ED Medications  sodium chloride 0.9 % bolus 1,000 mL (0 mLs Intravenous Stopped 05/05/21 1350)  ketorolac (TORADOL) 30 MG/ML injection 30 mg (30 mg Intravenous Given 05/05/21 1235)    ED Course  I have reviewed the triage vital signs and the nursing notes.  Pertinent labs & imaging results that were available during my care of the patient were reviewed by me and considered in my medical decision making (see chart for details).    MDM Rules/Calculators/A&P                           CT cervical spine shows some cervical spine stenosis.  CT head negative.  Pt is feeling a little better after toradol.  She did not want anything stronger.  The pt will be d/c with prednisone.  She does have gradually worsening kidney function.  Pt said she is followed by [facility].  Pt is stable for d/c.  She is to return if worse.  F/u with pcp.   Final Clinical Impression(s) / ED Diagnoses Final diagnoses:  Cervical stenosis of spine  Acute nonintractable headache, unspecified headache type    Rx / DC Orders ED Discharge Orders          Ordered    predniSONE (STERAPRED UNI-PAK 21 TAB) 10 MG (21) TBPK tablet  Daily        05/05/21 1440             Isla Pence, MD 05/05/21 1502

## 2021-05-08 ENCOUNTER — Telehealth: Payer: Medicare Other | Admitting: *Deleted

## 2021-05-09 ENCOUNTER — Ambulatory Visit: Payer: Medicare Other | Admitting: *Deleted

## 2021-05-09 ENCOUNTER — Ambulatory Visit: Payer: Medicare Other | Admitting: Licensed Clinical Social Worker

## 2021-05-09 DIAGNOSIS — M069 Rheumatoid arthritis, unspecified: Secondary | ICD-10-CM

## 2021-05-09 DIAGNOSIS — N184 Chronic kidney disease, stage 4 (severe): Secondary | ICD-10-CM

## 2021-05-09 DIAGNOSIS — E1169 Type 2 diabetes mellitus with other specified complication: Secondary | ICD-10-CM

## 2021-05-09 DIAGNOSIS — R270 Ataxia, unspecified: Secondary | ICD-10-CM

## 2021-05-09 DIAGNOSIS — E1122 Type 2 diabetes mellitus with diabetic chronic kidney disease: Secondary | ICD-10-CM

## 2021-05-09 NOTE — Chronic Care Management (AMB) (Signed)
Chronic Care Management    Clinical Social Work Note  05/09/2021 Name: Erica Crosby MRN: [id number] DOB: 02-20-1946  Erica Crosby is a 76 y.o. year old female who is a primary care patient of [name], DO. The CCM team was consulted to assist the patient with chronic disease management and/or care coordination needs related to: [name] .   Engaged with patient by telephone for follow up visit in response to provider referral for social work chronic care management and care coordination services.   Consent to Services:  The patient was given information about Chronic Care Management services, agreed to services, and gave verbal consent prior to initiation of services.  Please see initial visit note for detailed documentation.   Patient agreed to services and consent obtained.   Assessment: Review of patient past medical history, allergies, medications, and health status, including review of relevant consultants reports was performed today as part of a comprehensive evaluation and provision of chronic care management and care coordination services.     SDOH (Social Determinants of Health) assessments and interventions performed:  SDOH Interventions    Flowsheet Row Most Recent Value  SDOH Interventions   Physical Activity Interventions Other (Comments)  [client has walking challenges. she uses a walker to help her walk]  Stress Interventions Provide Counseling  [client has stress related to managing her medical needs]  Depression Interventions/Treatment  Patient refuses Treatment        Advanced Directives Status: See Vynca application for related entries.  CCM Care Plan  No Known Allergies  Outpatient Encounter Medications as of 05/09/2021  Medication Sig   Accu-Chek Softclix Lancets lancets CHECK BLOOD SUGAR TWICE A DAY AND AS NEEDED Dx E11.21   Adalimumab (HUMIRA PEN) 40 MG/0.4ML PNKT Inject 40 mg into the skin every 14 (fourteen) days. Can restart  06/04/19   aspirin EC 81 MG tablet Take 81 mg by mouth daily.   atorvastatin (LIPITOR) 10 MG tablet Take 1 tablet (10 mg total) by mouth daily.   Blood Glucose Monitoring Suppl (ACCU-CHEK AVIVA PLUS) w/Device KIT Test BS BID and prn Dx E11.21   carvedilol (COREG) 12.5 MG tablet Take 1 tablet (12.5 mg total) by mouth 2 (two) times daily with a meal.   ciprofloxacin (CIPRO) 500 MG tablet Take 500 mg by mouth daily.   enalapril (VASOTEC) 20 MG tablet 1 tablet   folic acid (FOLVITE) 1 MG tablet Take 1 tablet (1 mg total) by mouth daily.   gabapentin (NEURONTIN) 400 MG capsule Take 1 capsule (400 mg total) by mouth at bedtime.   glucose blood (ACCU-CHEK AVIVA PLUS) test strip CHECK BLOOD SUGAR 2 TIMES A DAY AND AS NEEDED Dx E11.21   insulin glargine (LANTUS SOLOSTAR) 100 UNIT/ML Solostar Pen Inject 5-10 Units into the skin at bedtime.   leflunomide (ARAVA) 20 MG tablet Take 1 tablet (20 mg total) by mouth daily. Can restart 06/04/19   Omega-3 Fatty Acids (FISH OIL) 1000 MG CAPS    OPTIMAL-D 1.25 MG (50000 UT) capsule TAKE 1 CAPSULE BY MOUTH ONCE A WEEK FOR 8 DOSES   pantoprazole (PROTONIX) 40 MG tablet Take 1 tablet (40 mg total) by mouth daily.   predniSONE (STERAPRED UNI-PAK 21 TAB) 10 MG (21) TBPK tablet Take by mouth daily. Take 6 tabs by mouth daily  for 2 days, then 5 tabs for 2 days, then 4 tabs for 2 days, then 3 tabs for 2 days, 2 tabs for 2 days, then 1 tab by mouth  daily for 2 days   No facility-administered encounter medications on file as of 05/09/2021.    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   Pneumonia due to COVID-19 virus 05/21/2019   Hyperlipidemia associated with type 2 diabetes mellitus ([place]) 07/07/2018   Noncompliance with medication regimen 07/07/2018   Palpitations 05/07/2018   RA (rheumatoid arthritis) ([place]) 03/20/2017   Mixed hyperlipidemia  02/13/2017   Hypertension associated with stage 3 chronic kidney disease due to type 2 diabetes mellitus ([place]) 12/24/2016   Personal history of noncompliance with medical treatment, presenting hazards to health 12/24/2016   Bulging lumbar disc 08/22/2016   Syncope 02/10/2012   Ataxia 02/10/2012   Uncontrolled type 2 diabetes mellitus with diabetic nephropathy, with long-term current use of insulin 02/10/2012    Conditions to be addressed/monitored: monitor client completion of ADLs  Care Plan : [place]  Updates made by Katha Cabal, LCSW since 05/09/2021 12:00 AM     Problem: Coping Skills (General Plan of Care)      Goal: Coping Skills Enhanced'Manage ADLS daily as able   Start Date: 05/09/2021  Expected End Date: 08/07/2021  This Visit's Progress: On track  Recent Progress: On track  Priority: Medium  Note:   Current barriers:   Patient in need of assistance with connecting to community resources for possible help in completing her daily ADLs and other daily tasks Patient is unable to independently navigate community resource options without care coordination support Some mobility issues Vision issues Challenges in managing Diabetes  Clinical Goals:   LCSW to talk with client in next 30 days about client completion of ADLs and client completion of daily activities Patient to call LCSW or RNCM as needed in next 30 days for CCM support Client to communicate with LCSW in next 30 days as needed to discuss in home care support resources for client  Clinical Interventions:  Collaboration with [name], DO regarding development and update of comprehensive plan of care as evidenced by provider attestation and co-signature Reviewed client status since fall on 04/12/21. She fell at her home and hit her head on a coffee table.  She has had a CT scan and results per record are stable. She said her pain issues are manageable. Reviewed mood status of client. She  said she gets a little sad sometimes and said she sometimes worries about her health needs.   Discussed client support from kidney specialist. Discussed client management of Diabetes. She said she is currently checking her sugar level several times daily. Discussed client mobility. She said she uses a walker to help her ambulate.  Reviewed medication procurement with client Reviewed family support. She has support from her daughter, [name].  [name] helps her get up and ready in the mornings.  Nedra manages client's bills and payment on client bills. Son of client is supportive. Niece of client is supportive. Reviewed client difficulty in sleeping Encouraged client to call RNCM or LCSW as needed for CCM support Provided counseling support for client Discussed in home care support with client. LCSW gave Orlena the name of ADTS as in home care support agency.  LCSW offered phone number for agency but client did not write number down Discussed Medicaid application for client. Xariah said she has a niece who plans to help [place] with Medicaid application.  Patient Strengths: Has support from her daughter  Client attends scheduled medical appointments Takes medications as prescribed  Patient Deficits: Occasional help needed with completing ADLs Some mobility issues  Patient Goals:  Patient will attend scheduled medical appointments in next 30 days Patient will communicate with RNCM or LCSW as needed in nest 30 days for CCm support Patient will communicate regularly with her daughter in next 30 days to discuss the needs of client -  Follow Up Plan: LCSW to call client or her daughter , [name], on 07/10/21 at 10:00 AM to assess needs of client      [name].Caryl Manas MSW, LCSW Licensed Clinical Social Worker [facility] Care Management [phone]

## 2021-05-09 NOTE — Patient Instructions (Addendum)
Visit Information  Patient Goals:  [name] (Patient). Complete ADLs and other daily activities  Timeframe:  Short-Term Goal Priority:  Medium Progress:  On Track Start Date:             05/09/21               Expected End Date:        08/07/21              Follow Up Date  07/10/21 at 10:00 AM   [name] (Patient) Complete ADLs and other daily activities     Why is this important?   Screening tests can find diseases early when they are easier to treat.  Your doctor or nurse will talk with you about which tests are important for you.  Getting shots for common diseases like the flu and shingles will help prevent them.     Patient Strengths: Has support from her daughter Client attends scheduled medical appointments Takes medications as prescribed  Patient Deficits: Occasional help needed with completing ADLs Some mobility issues  Patient Goals:  Patient will attend scheduled medical appointments in next 30 days Patient will communicate with RNCM or LCSW as needed in next 30 days for CCM support Patient will communicate regularly with her daughter in next 30 days to discuss the needs of client -  Follow Up Plan: LCSW to call client or her daughter , [name], on 07/10/21 at 10:00 AM to assess client needs.   [name].Tanmay Halteman MSW, LCSW Licensed Clinical Social Worker [facility] Care Management [phone]

## 2021-05-16 ENCOUNTER — Telehealth: Payer: Self-pay | Admitting: Family Medicine

## 2021-05-16 NOTE — Telephone Encounter (Signed)
na

## 2021-05-16 NOTE — Telephone Encounter (Signed)
  Prescription Request  05/16/2021  Is this a "Controlled Substance" medicine? no  Have you seen your PCP in the last 2 weeks? no  If YES, route message to pool  -  If NO, patient needs to be scheduled for appointment.  What is the name of the medication or equipment? Libre sensor  Have you contacted your pharmacy to request a refill? No    Which pharmacy would you like this sent to? Pt seems confused about where it should go. I think she needs samples.  Pt says she does to someone from here about this, from the conversation I think she is talking about [facility].    Patient notified that their request is being sent to the clinical staff for review and that they should receive a response within 2 business days.

## 2021-05-17 DIAGNOSIS — E1122 Type 2 diabetes mellitus with diabetic chronic kidney disease: Secondary | ICD-10-CM

## 2021-05-17 DIAGNOSIS — M069 Rheumatoid arthritis, unspecified: Secondary | ICD-10-CM

## 2021-05-17 DIAGNOSIS — I129 Hypertensive chronic kidney disease with stage 1 through stage 4 chronic kidney disease, or unspecified chronic kidney disease: Secondary | ICD-10-CM | POA: Diagnosis not present

## 2021-05-17 DIAGNOSIS — N184 Chronic kidney disease, stage 4 (severe): Secondary | ICD-10-CM

## 2021-05-17 DIAGNOSIS — Z794 Long term (current) use of insulin: Secondary | ICD-10-CM

## 2021-05-17 DIAGNOSIS — E1169 Type 2 diabetes mellitus with other specified complication: Secondary | ICD-10-CM | POA: Diagnosis not present

## 2021-05-17 DIAGNOSIS — E785 Hyperlipidemia, unspecified: Secondary | ICD-10-CM

## 2021-05-18 NOTE — Telephone Encounter (Signed)
Rc for nurse 

## 2021-05-22 NOTE — Telephone Encounter (Signed)
Wants to know if she can get a Libre

## 2021-05-23 NOTE — Telephone Encounter (Signed)
PT AWARE  

## 2021-05-23 NOTE — Telephone Encounter (Signed)
What meds is she referring to? I haven't seen her in clinic since 09/2020.  She sees a nephrologist for advanced kidney disease.  Kidney disease can cause swelling.  Advise AGAINST use of ANY nsaid.

## 2021-05-23 NOTE — Telephone Encounter (Signed)
Patient already has [place] 2 via [facility] Does she need refills?  She needs to call: [facility]  [name] diabetes arm monitor Telephone: [phone]

## 2021-05-23 NOTE — Telephone Encounter (Signed)
She's on insulin.  Do you think we can get one for her?

## 2021-05-25 ENCOUNTER — Encounter: Payer: Self-pay | Admitting: *Deleted

## 2021-05-25 NOTE — Chronic Care Management (AMB) (Signed)
Chronic Care Management   CCM RN Visit Note  05/09/2021 Name: Erica Crosby MRN: [id number] DOB: 02-08-1946  Subjective: Erica Crosby is a 75 y.o. year old female who is a primary care patient of [name], DO. The care management team was consulted for assistance with disease management and care coordination needs.    Engaged with patient by telephone for follow up visit in response to provider referral for case management and/or care coordination services.   Consent to Services:  The patient was given information about Chronic Care Management services, agreed to services, and gave verbal consent prior to initiation of services.  Please see initial visit note for detailed documentation.   Patient agreed to services and verbal consent obtained.   Assessment: Review of patient past medical history, allergies, medications, health status, including review of consultants reports, laboratory and other test data, was performed as part of comprehensive evaluation and provision of chronic care management services.   SDOH (Social Determinants of Health) assessments and interventions performed:    CCM Care Plan  No Known Allergies  Outpatient Encounter Medications as of 05/09/2021  Medication Sig   Accu-Chek Softclix Lancets lancets CHECK BLOOD SUGAR TWICE A DAY AND AS NEEDED Dx E11.21   Adalimumab (HUMIRA PEN) 40 MG/0.4ML PNKT Inject 40 mg into the skin every 14 (fourteen) days. Can restart 06/04/19   aspirin EC 81 MG tablet Take 81 mg by mouth daily.   atorvastatin (LIPITOR) 10 MG tablet Take 1 tablet (10 mg total) by mouth daily.   Blood Glucose Monitoring Suppl (ACCU-CHEK AVIVA PLUS) w/Device KIT Test BS BID and prn Dx E11.21   carvedilol (COREG) 12.5 MG tablet Take 1 tablet (12.5 mg total) by mouth 2 (two) times daily with a meal.   ciprofloxacin (CIPRO) 500 MG tablet Take 500 mg by mouth daily.   enalapril (VASOTEC) 20 MG tablet 1 tablet   folic acid (FOLVITE) 1 MG tablet  Take 1 tablet (1 mg total) by mouth daily.   gabapentin (NEURONTIN) 400 MG capsule Take 1 capsule (400 mg total) by mouth at bedtime.   glucose blood (ACCU-CHEK AVIVA PLUS) test strip CHECK BLOOD SUGAR 2 TIMES A DAY AND AS NEEDED Dx E11.21   insulin glargine (LANTUS SOLOSTAR) 100 UNIT/ML Solostar Pen Inject 5-10 Units into the skin at bedtime.   leflunomide (ARAVA) 20 MG tablet Take 1 tablet (20 mg total) by mouth daily. Can restart 06/04/19   Omega-3 Fatty Acids (FISH OIL) 1000 MG CAPS    OPTIMAL-D 1.25 MG (50000 UT) capsule TAKE 1 CAPSULE BY MOUTH ONCE A WEEK FOR 8 DOSES   pantoprazole (PROTONIX) 40 MG tablet Take 1 tablet (40 mg total) by mouth daily.   predniSONE (STERAPRED UNI-PAK 21 TAB) 10 MG (21) TBPK tablet Take by mouth daily. Take 6 tabs by mouth daily  for 2 days, then 5 tabs for 2 days, then 4 tabs for 2 days, then 3 tabs for 2 days, 2 tabs for 2 days, then 1 tab by mouth daily for 2 days   No facility-administered encounter medications on file as of 05/09/2021.    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   Pneumonia due to COVID-19 virus 05/21/2019   Hyperlipidemia associated with type 2 diabetes mellitus ([place]) 07/07/2018   Noncompliance with medication regimen 07/07/2018   Palpitations 05/07/2018   RA (rheumatoid arthritis) ([place])  03/20/2017   Mixed hyperlipidemia 02/13/2017   Hypertension associated with stage 3 chronic kidney disease due to type 2 diabetes mellitus ([place]) 12/24/2016   Personal history of noncompliance with medical treatment, presenting hazards to health 12/24/2016   Bulging lumbar disc 08/22/2016   Syncope 02/10/2012   Ataxia 02/10/2012   Uncontrolled type 2 diabetes mellitus with diabetic nephropathy, with long-term current use of insulin 02/10/2012    Conditions to be addressed/monitored:HTN, DMII, and CKD Stage 3  Care Plan :  [facility] Care Plan   Problem: Chronic Disease Management Needs   Priority: Medium     Long-Range Goal: Work with [place] with diabetes and CKD   This Visit's Progress: On track  Recent Progress: On track  Priority: High  Note:   Current Barriers:  Chronic Disease Management support and education needs related to DMII and CKD Stage 3 Transportation barriers  RNCM Clinical Goal(s):  Patient will take all medications exactly as prescribed and will call provider for medication related questions continue to work with RN Care Manager to address care management and care coordination needs related to DMII and CKD Stage 3  work with pharmacist to address medication management related to DMII through collaboration with RN Care manager, provider, and care team.   Interventions: 1:1 collaboration with primary care provider regarding development and update of comprehensive plan of care as evidenced by provider attestation and co-signature Inter-disciplinary care team collaboration (see longitudinal plan of care) Evaluation of current treatment plan related to  self management and patient's adherence to plan as established by provider   Chronic Kidney Disease (Status: Goal on Track (progressing): YES.)  Last practice recorded BP readings:  Lab Results  Component Value Date   CREATININE 2.73 (H) 05/05/2021   BUN 57 (H) 05/05/2021   NA 139 05/05/2021   K 4.6 05/05/2021   CL 112 (H) 05/05/2021   CO2 21 (L) 05/05/2021  Evaluation of current treatment plan related to chronic kidney disease self management and patient's adherence to plan as established by provider      Reviewed medications with patient and discussed importance of compliance    Advised patient, providing education and rationale, to monitor blood pressure daily and record, calling PCP for findings outside established parameters    Discussed complications of poorly controlled blood  pressure such as heart disease, stroke, circulatory complications, vision complications, kidney impairment, sexual dysfunction    Reviewed scheduled/upcoming provider appointments including    Discussed plans with patient for ongoing care management follow up and provided patient with direct contact information for care management team    Assessed social determinant of health barriers      Diabetes:  (Status: Condition stable. Not addressed this visit.) Lab Results  Component Value Date   HGBA1C 6.0 10/14/2020   HGBA1C 5.9 06/15/2020   HGBA1C 6.1 12/09/2019   Lab Results  Component Value Date   LDLCALC 77 10/14/2020   CREATININE 2.73 (H) 05/05/2021  Assessed patient's understanding of A1c goal: <7% Counseled on importance of regular laboratory monitoring as prescribed;        Discussed plans with patient for ongoing care management follow up and provided patient with direct contact information for care management team;      Advised patient, providing education and rationale, to check cbg continuously with CGM and record        call provider for findings outside established parameters;       Review of patient status, including  review of consultants reports, relevant laboratory and other test results, and medications completed;       reviewed diet and appetite. Encouraged to eat meals at regular intervals to avoid low blood sugar and to avoid sugar and simple carbs Encouraged to reach out to PCP with any blood sugar readings outside of recommended range and to monitor for symptoms of hypoglycemia  Reviewed upcoming appointments   Hypertension: (Status: Goal on Track (progressing): YES.) Last practice recorded BP readings:  BP Readings from Last 3 Encounters:  05/05/21 (!) 161/80  10/14/20 135/69  06/15/20 138/85   Evaluation of current treatment plan related to hypertension self management and patient's adherence to plan as established by provider;   Reviewed medications with  patient and discussed importance of compliance;  Advised patient, providing education and rationale, to monitor blood pressure daily and record, calling PCP for findings outside established parameters;  Discussed complications of poorly controlled blood pressure such as heart disease, stroke, circulatory complications, vision complications, kidney impairment, sexual dysfunction;    Falls:  (Status: No Needs Identified this visit) Long Term Goal  Provided written and verbal education re: potential causes of falls and Fall prevention strategies Reviewed medications and discussed potential side effects of medications such as dizziness and frequent urination Advised patient of importance of notifying provider of falls Assessed for falls since last encounter Provided patient information for fall alert systems Advised patient to discuss insulin dosage and hypoglycemic episodes with provider Prepared written educational materials on [facility] which is covered by [facility]. To be mailed to patient by [place] office staff.     Patient Goals/Self-Care Activities: Patient will self administer medications as prescribed Patient will call pharmacy for medication refills Patient will continue to perform ADL's independently Patient will call provider office for new concerns or questions Eat meals at regular intervals to avoid low blood sugar Check blood sugar daily and as needed Bring Libre meter to office visits for review Keep a log of insulin usage and blood sugar levels Bring log to PCP appointments Call PCP if blood sugar continues to run "Low" Seek appropriate medical attention after any falls Call RN Care Manager as needed [phone]  Plan:Telephone follow up appointment with care management team member scheduled for:  06/08/21 with RNCM The patient has been provided with contact information for the care management team and has been advised to call with any health related questions or  concerns.   Chong Sicilian, BSN, RN-BC Embedded Chronic Care Manager [facility] / [place] Management Direct Dial: [phone]

## 2021-05-25 NOTE — Patient Instructions (Signed)
Visit Information  Patient Goals/Self-Care Activities: Patient will self administer medications as prescribed Patient will call pharmacy for medication refills Patient will continue to perform ADL's independently Patient will call provider office for new concerns or questions Eat meals at regular intervals to avoid low blood sugar Check blood sugar daily and as needed Bring Libre meter to office visits for review Keep a log of insulin usage and blood sugar levels Bring log to PCP appointments Call PCP if blood sugar continues to run "Low" Seek appropriate medical attention after any falls Call RN Care Manager as needed [phone]  Patient verbalizes understanding of instructions provided today and agrees to view in [place].   Plan:Telephone follow up appointment with care management team member scheduled for:  06/08/21 with [facility] The patient has been provided with contact information for the care management team and has been advised to call with any health related questions or concerns.   Chong Sicilian, BSN, RN-BC Embedded Chronic Care Manager [facility] / [place] Management Direct Dial: [phone]

## 2021-06-01 DIAGNOSIS — L84 Corns and callosities: Secondary | ICD-10-CM | POA: Diagnosis not present

## 2021-06-01 DIAGNOSIS — B351 Tinea unguium: Secondary | ICD-10-CM | POA: Diagnosis not present

## 2021-06-01 DIAGNOSIS — M79676 Pain in unspecified toe(s): Secondary | ICD-10-CM | POA: Diagnosis not present

## 2021-06-01 DIAGNOSIS — E1142 Type 2 diabetes mellitus with diabetic polyneuropathy: Secondary | ICD-10-CM | POA: Diagnosis not present

## 2021-06-06 DIAGNOSIS — E1165 Type 2 diabetes mellitus with hyperglycemia: Secondary | ICD-10-CM | POA: Diagnosis not present

## 2021-06-08 ENCOUNTER — Encounter: Payer: Self-pay | Admitting: *Deleted

## 2021-06-08 ENCOUNTER — Ambulatory Visit (INDEPENDENT_AMBULATORY_CARE_PROVIDER_SITE_OTHER): Payer: Medicare Other | Admitting: *Deleted

## 2021-06-08 DIAGNOSIS — E1122 Type 2 diabetes mellitus with diabetic chronic kidney disease: Secondary | ICD-10-CM

## 2021-06-08 DIAGNOSIS — N184 Chronic kidney disease, stage 4 (severe): Secondary | ICD-10-CM

## 2021-06-08 NOTE — Patient Instructions (Signed)
Visit Information  Patient Goals/Self-Care Activities: Patient will self administer medications as prescribed Patient will call pharmacy for medication refills Patient will continue to perform ADL's independently Patient will call provider office for new concerns or questions Eat meals at regular intervals to avoid low blood sugar Check blood sugar daily and as needed Bring Libre meter to office visits for review Use fingersticks to check blood sugar when it registers low on the CGM or if you feel that it is low Bring log to PCP appointments Call PCP if blood sugar continues to run "Low" Check and record blood pressure 3 times per week and call PCP with any readings outside of recommended range Call RN Care Manager as needed [phone]  Patient verbalizes understanding of instructions provided today and agrees to view in [place].   Plan:Telephone follow up appointment with care management team member scheduled for:  06/23/21 with RNCM The patient has been provided with contact information for the care management team and has been advised to call with any health related questions or concerns.   Chong Sicilian, BSN, RN-BC Embedded Chronic Care Manager [facility] / [place] Management Direct Dial: [phone]

## 2021-06-08 NOTE — Chronic Care Management (AMB) (Signed)
Chronic Care Management   CCM RN Visit Note  06/08/2021 Name: Erica Crosby MRN: [id number] DOB: 1945/12/23  Subjective: Erica Crosby is a 75 y.o. year old female who is a primary care patient of [name], DO. The care management team was consulted for assistance with disease management and care coordination needs.    Engaged with patient by telephone for follow up visit in response to provider referral for case management and/or care coordination services.   Consent to Services:  The patient was given information about Chronic Care Management services, agreed to services, and gave verbal consent prior to initiation of services.  Please see initial visit note for detailed documentation.   Patient agreed to services and verbal consent obtained.   Assessment: Review of patient past medical history, allergies, medications, health status, including review of consultants reports, laboratory and other test data, was performed as part of comprehensive evaluation and provision of chronic care management services.   SDOH (Social Determinants of Health) assessments and interventions performed:    CCM Care Plan  No Known Allergies  Outpatient Encounter Medications as of 06/08/2021  Medication Sig   Accu-Chek Softclix Lancets lancets CHECK BLOOD SUGAR TWICE A DAY AND AS NEEDED Dx E11.21   Adalimumab (HUMIRA PEN) 40 MG/0.4ML PNKT Inject 40 mg into the skin every 14 (fourteen) days. Can restart 06/04/19   aspirin EC 81 MG tablet Take 81 mg by mouth daily.   atorvastatin (LIPITOR) 10 MG tablet Take 1 tablet (10 mg total) by mouth daily.   Blood Glucose Monitoring Suppl (ACCU-CHEK AVIVA PLUS) w/Device KIT Test BS BID and prn Dx E11.21   carvedilol (COREG) 12.5 MG tablet Take 1 tablet (12.5 mg total) by mouth 2 (two) times daily with a meal.   ciprofloxacin (CIPRO) 500 MG tablet Take 500 mg by mouth daily.   enalapril (VASOTEC) 20 MG tablet 1 tablet   folic acid (FOLVITE) 1 MG tablet  Take 1 tablet (1 mg total) by mouth daily.   gabapentin (NEURONTIN) 400 MG capsule Take 1 capsule (400 mg total) by mouth at bedtime.   glucose blood (ACCU-CHEK AVIVA PLUS) test strip CHECK BLOOD SUGAR 2 TIMES A DAY AND AS NEEDED Dx E11.21   insulin glargine (LANTUS SOLOSTAR) 100 UNIT/ML Solostar Pen Inject 5-10 Units into the skin at bedtime.   leflunomide (ARAVA) 20 MG tablet Take 1 tablet (20 mg total) by mouth daily. Can restart 06/04/19   Omega-3 Fatty Acids (FISH OIL) 1000 MG CAPS    OPTIMAL-D 1.25 MG (50000 UT) capsule TAKE 1 CAPSULE BY MOUTH ONCE A WEEK FOR 8 DOSES   pantoprazole (PROTONIX) 40 MG tablet Take 1 tablet (40 mg total) by mouth daily.   predniSONE (STERAPRED UNI-PAK 21 TAB) 10 MG (21) TBPK tablet Take by mouth daily. Take 6 tabs by mouth daily  for 2 days, then 5 tabs for 2 days, then 4 tabs for 2 days, then 3 tabs for 2 days, 2 tabs for 2 days, then 1 tab by mouth daily for 2 days   No facility-administered encounter medications on file as of 06/08/2021.    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   Pneumonia due to COVID-19 virus 05/21/2019   Hyperlipidemia associated with type 2 diabetes mellitus ([place]) 07/07/2018   Noncompliance with medication regimen 07/07/2018   Palpitations 05/07/2018   RA (rheumatoid arthritis) ([place])  03/20/2017   Mixed hyperlipidemia 02/13/2017   Hypertension associated with stage 3 chronic kidney disease due to type 2 diabetes mellitus ([place]) 12/24/2016   Personal history of noncompliance with medical treatment, presenting hazards to health 12/24/2016   Bulging lumbar disc 08/22/2016   Syncope 02/10/2012   Ataxia 02/10/2012   Uncontrolled type 2 diabetes mellitus with diabetic nephropathy, with long-term current use of insulin 02/10/2012    Conditions to be addressed/monitored:HTN and DMII  Care Plan : [place]   Updates made by Ilean China, RN since 06/08/2021 12:00 AM     Problem: Chronic Disease Management Needs   Priority: Medium     Long-Range Goal: Work with [facility] Regarding Care Management and Care Coordination Associated with diabetes and CKD   This Visit's Progress: On track  Recent Progress: On track  Priority: High  Note:   Current Barriers:  Chronic Disease Management support and education needs related to DMII and CKD Stage 3 Transportation barriers  RNCM Clinical Goal(s):  Patient will take all medications exactly as prescribed and will call provider for medication related questions continue to work with RN Care Manager to address care management and care coordination needs related to DMII and CKD Stage 3  work with pharmacist to address medication management related to DMII through collaboration with RN Care manager, provider, and care team.   Interventions: 1:1 collaboration with primary care provider regarding development and update of comprehensive plan of care as evidenced by provider attestation and co-signature Inter-disciplinary care team collaboration (see longitudinal plan of care) Evaluation of current treatment plan related to  self management and patient's adherence to plan as established by provider   Chronic Kidney Disease (Status: Condition stable. Not addressed this visit.)  Last practice recorded BP readings:  Lab Results  Component Value Date   CREATININE 2.73 (H) 05/05/2021   BUN 57 (H) 05/05/2021   NA 139 05/05/2021   K 4.6 05/05/2021   CL 112 (H) 05/05/2021   CO2 21 (L) 05/05/2021  Evaluation of current treatment plan related to chronic kidney disease self management and patient's adherence to plan as established by provider      Reviewed medications with patient and discussed importance of compliance    Advised patient, providing education and rationale, to monitor blood pressure daily and record, calling PCP for findings outside established  parameters    Discussed complications of poorly controlled blood pressure such as heart disease, stroke, circulatory complications, vision complications, kidney impairment, sexual dysfunction    Reviewed scheduled/upcoming provider appointments including    Discussed plans with patient for ongoing care management follow up and provided patient with direct contact information for care management team    Assessed social determinant of health barriers      Diabetes:  (Status: Goal on track: NO.) Lab Results  Component Value Date   HGBA1C 6.0 10/14/2020   HGBA1C 5.9 06/15/2020   HGBA1C 6.1 12/09/2019   Lab Results  Component Value Date   LDLCALC 77 10/14/2020   CREATININE 2.73 (H) 05/05/2021  Counseled on importance of regular laboratory monitoring as prescribed;        Discussed plans with patient for ongoing care management follow up and provided patient with direct contact information for care management team;      Advised patient, providing education and rationale, to check cbg when you have symptoms of low or high blood sugar and continuously with CGM  and record        call provider for  findings outside established parameters;       Review of patient status, including review of consultants reports, relevant laboratory and other test results, and medications completed;       reviewed diet and appetite. Encouraged to eat meals at regular intervals to avoid low blood sugar and to avoid sugar and simple carbs Encouraged to reach out to PCP with any blood sugar readings outside of recommended range and to monitor for symptoms of hypoglycemia  Reviewed upcoming appointments. Scheduled with Dr Lajuana Ripple on 06/16/21 Advised to bring libre monitor to visit so that the data can be downloaded and reviewed for hypoglycemic patterns Discussed symptomatic blood sugar of 59 this morning. She was not alone and was going to eat to bring it up.  Collaborated with Dr Lajuana Ripple regarding labile blood  sugar readings and recurrent hypoglycemic events Reminded patient that Dr Lajuana Ripple had recommended an endocrinology referral if hypoglycemia persisted Discussed family/social support Assessed for SDOH barriers   Hypertension: (Status: Goal on Track (progressing): YES.) Last practice recorded BP readings:  BP Readings from Last 3 Encounters:  05/05/21 (!) 161/80  10/14/20 135/69  06/15/20 138/85   Evaluation of current treatment plan related to hypertension self management and patient's adherence to plan as established by provider;   Reviewed medications with patient and discussed importance of compliance;  Advised patient, providing education and rationale, to monitor blood pressure daily and record, calling PCP for findings outside established parameters;  Discussed complications of poorly controlled blood pressure such as heart disease, stroke, circulatory complications, vision complications, kidney impairment, sexual dysfunction;    Falls:  (Status: No Needs Identified this visit) Long Term Goal  Provided written and verbal education re: potential causes of falls and Fall prevention strategies Reviewed medications and discussed potential side effects of medications such as dizziness and frequent urination Advised patient of importance of notifying provider of falls Assessed for falls since last encounter Provided patient information for fall alert systems Advised patient to discuss insulin dosage and hypoglycemic episodes with provider Prepared written educational materials on [facility] which is covered by [facility]. To be mailed to patient by [place] office staff.     Patient Goals/Self-Care Activities: Patient will self administer medications as prescribed Patient will call pharmacy for medication refills Patient will continue to perform ADL's independently Patient will call provider office for new concerns or questions Eat meals at regular intervals to avoid low blood  sugar Check blood sugar daily and as needed Bring Libre meter to office visits for review Use fingersticks to check blood sugar when it registers low on the CGM or if you feel that it is low Bring log to PCP appointments Call PCP if blood sugar continues to run "Low" Check and record blood pressure 3 times per week and call PCP with any readings outside of recommended range Call RN Care Manager as needed [phone]  Plan:Telephone follow up appointment with care management team member scheduled for:  06/23/21 with RNCM The patient has been provided with contact information for the care management team and has been advised to call with any health related questions or concerns.   Chong Sicilian, BSN, RN-BC Embedded Chronic Care Manager [facility] [place] Management Direct Dial: [phone]

## 2021-06-16 ENCOUNTER — Ambulatory Visit (INDEPENDENT_AMBULATORY_CARE_PROVIDER_SITE_OTHER): Payer: Medicare Other | Admitting: Family Medicine

## 2021-06-16 ENCOUNTER — Encounter: Payer: Self-pay | Admitting: Family Medicine

## 2021-06-16 DIAGNOSIS — I129 Hypertensive chronic kidney disease with stage 1 through stage 4 chronic kidney disease, or unspecified chronic kidney disease: Secondary | ICD-10-CM

## 2021-06-16 DIAGNOSIS — E1122 Type 2 diabetes mellitus with diabetic chronic kidney disease: Secondary | ICD-10-CM

## 2021-06-16 DIAGNOSIS — R7309 Other abnormal glucose: Secondary | ICD-10-CM | POA: Diagnosis not present

## 2021-06-16 DIAGNOSIS — E1169 Type 2 diabetes mellitus with other specified complication: Secondary | ICD-10-CM

## 2021-06-16 DIAGNOSIS — Z794 Long term (current) use of insulin: Secondary | ICD-10-CM

## 2021-06-16 DIAGNOSIS — Z9181 History of falling: Secondary | ICD-10-CM

## 2021-06-16 DIAGNOSIS — E785 Hyperlipidemia, unspecified: Secondary | ICD-10-CM

## 2021-06-16 DIAGNOSIS — N184 Chronic kidney disease, stage 4 (severe): Secondary | ICD-10-CM

## 2021-06-16 NOTE — Progress Notes (Signed)
Telephone visit  Subjective: CX:[id number] BGs PCP: [name], DO HPI:Erica Crosby is a 75 y.o. female calls for telephone consult today. Patient provides verbal consent for consult held via phone.  Due to COVID-19 pandemic this visit was conducted virtually. This visit type was conducted due to national recommendations for restrictions regarding the COVID-19 Pandemic (e.g. social distancing, sheltering in place) in an effort to limit this patient's exposure and mitigate transmission in our community. All issues noted in this document were discussed and addressed.  A physical exam was not performed with this format.   Location of patient: home Location of provider: [facility] Others present for call: none  1. Type 2 Diabetes with hypertension, hyperlipidemia associated with CKD4:  Patient reports BGs are high sometimes.  She reports it drops low at night sometimes.  Low 65.  High: 250.  She has been using the [place] and she does double check with the POC.  She has been injecting Lantus 15 units only if needed for high blood sugars.  Last eye exam: needs Last foot exam: needs Last A1c:  Lab Results  Component Value Date   HGBA1C 6.0 10/14/2020   Nephropathy screen indicated?: has CKD4 Last flu, zoster and/or pneumovax:  Immunization History  Administered Date(s) Administered   Influenza Split 04/05/2010   Influenza, High Dose Seasonal PF 03/11/2018, 02/19/2019   Influenza,inj,Quad PF,6+ Mos 03/18/2016, 03/20/2017   Influenza-Unspecified 03/02/2021   Moderna Sars-Covid-2 Vaccination 07/24/2019, 08/21/2019, 05/11/2020   Pneumococcal Conjugate-13 04/21/2016   Pneumococcal Polysaccharide-23 04/05/2010, 08/14/2017, 01/03/2018   Tdap 11/05/2009   Zoster Recombinat (Shingrix) 01/03/2018, 03/11/2018    ROS: reports some edema. No CP, SOB.  Had a fall in October.  Was evaluated in ED.    ROS: Per HPI  No Known Allergies Past Medical History:  Diagnosis Date   Anemia    Arthritis     Cataract    Coronary artery disease    Mild plaque 2012   Diabetes mellitus    Essential hypertension 02/10/2012   GERD (gastroesophageal reflux disease)    Hyperlipidemia    Hypertension    Neuropathy    Peptic ulcer    Rheumatoid arthritis ([place])    TIA (transient ischemic attack) 02/10/2012    Current Outpatient Medications:    Accu-Chek Softclix Lancets lancets, CHECK BLOOD SUGAR TWICE A DAY AND AS NEEDED Dx E11.21, Disp: 200 each, Rfl: 3   Adalimumab (HUMIRA PEN) 40 MG/0.4ML PNKT, Inject 40 mg into the skin every 14 (fourteen) days. Can restart 06/04/19, Disp: 2 each, Rfl:    aspirin EC 81 MG tablet, Take 81 mg by mouth daily., Disp: , Rfl:    atorvastatin (LIPITOR) 10 MG tablet, Take 1 tablet (10 mg total) by mouth daily., Disp: 90 tablet, Rfl: 3   Blood Glucose Monitoring Suppl (ACCU-CHEK AVIVA PLUS) w/Device KIT, Test BS BID and prn Dx E11.21, Disp: 1 kit, Rfl: 0   carvedilol (COREG) 12.5 MG tablet, Take 1 tablet (12.5 mg total) by mouth 2 (two) times daily with a meal., Disp: 180 tablet, Rfl: 3   ciprofloxacin (CIPRO) 500 MG tablet, Take 500 mg by mouth daily., Disp: , Rfl:    enalapril (VASOTEC) 20 MG tablet, 1 tablet, Disp: , Rfl:    folic acid (FOLVITE) 1 MG tablet, Take 1 tablet (1 mg total) by mouth daily., Disp: 90 tablet, Rfl: 3   gabapentin (NEURONTIN) 400 MG capsule, Take 1 capsule (400 mg total) by mouth at bedtime., Disp: 90 capsule, Rfl: 3  glucose blood (ACCU-CHEK AVIVA PLUS) test strip, CHECK BLOOD SUGAR 2 TIMES A DAY AND AS NEEDED Dx E11.21, Disp: 200 strip, Rfl: 3   insulin glargine (LANTUS SOLOSTAR) 100 UNIT/ML Solostar Pen, Inject 5-10 Units into the skin at bedtime., Disp: 15 mL, Rfl: 2   leflunomide (ARAVA) 20 MG tablet, Take 1 tablet (20 mg total) by mouth daily. Can restart 06/04/19, Disp:  , Rfl:    Omega-3 Fatty Acids (FISH OIL) 1000 MG CAPS, , Disp: , Rfl:    OPTIMAL-D 1.25 MG (50000 UT) capsule, TAKE 1 CAPSULE BY MOUTH ONCE A WEEK FOR 8 DOSES, Disp: 8  capsule, Rfl: 0   pantoprazole (PROTONIX) 40 MG tablet, Take 1 tablet (40 mg total) by mouth daily., Disp: 90 tablet, Rfl: 3  Assessment/ Plan: 75 y.o. 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  I reinforced need to inject no more than 10 units at a time.  Continue to monitor blood sugars.  She has future orders in place for labs.  She will have a family member bring her by the office to have these done  Continue to follow-up with nephrology for CKD.  Continue to monitor blood pressures at home  She is certainly at high risk for falls due to chronic medical issues and advanced age.  I do agree that personal care services would be of benefit to this patient and will be glad to fill out this form once she is approved for Medicaid.  We discussed that unfortunately Medicare does not cover these types of services  Start time: 1:48pm End time: 2:05pm  Total time spent on patient care (including telephone call/ virtual visit): 17 minutes  [place], [place] [phone]

## 2021-06-17 DIAGNOSIS — Z794 Long term (current) use of insulin: Secondary | ICD-10-CM | POA: Diagnosis not present

## 2021-06-17 DIAGNOSIS — E1122 Type 2 diabetes mellitus with diabetic chronic kidney disease: Secondary | ICD-10-CM

## 2021-06-17 DIAGNOSIS — N184 Chronic kidney disease, stage 4 (severe): Secondary | ICD-10-CM

## 2021-06-17 DIAGNOSIS — I129 Hypertensive chronic kidney disease with stage 1 through stage 4 chronic kidney disease, or unspecified chronic kidney disease: Secondary | ICD-10-CM

## 2021-06-23 ENCOUNTER — Telehealth: Payer: Medicare Other | Admitting: *Deleted

## 2021-06-30 ENCOUNTER — Telehealth: Payer: Medicare Other

## 2021-07-03 ENCOUNTER — Ambulatory Visit (INDEPENDENT_AMBULATORY_CARE_PROVIDER_SITE_OTHER): Payer: Medicare Other | Admitting: Licensed Clinical Social Worker

## 2021-07-03 DIAGNOSIS — M069 Rheumatoid arthritis, unspecified: Secondary | ICD-10-CM

## 2021-07-03 DIAGNOSIS — E782 Mixed hyperlipidemia: Secondary | ICD-10-CM

## 2021-07-03 DIAGNOSIS — R270 Ataxia, unspecified: Secondary | ICD-10-CM

## 2021-07-03 DIAGNOSIS — E1122 Type 2 diabetes mellitus with diabetic chronic kidney disease: Secondary | ICD-10-CM

## 2021-07-03 DIAGNOSIS — Z794 Long term (current) use of insulin: Secondary | ICD-10-CM

## 2021-07-03 NOTE — Chronic Care Management (AMB) (Signed)
Chronic Care Management    Clinical Social Work Note  07/03/2021 Name: Erica Crosby MRN: [id number] DOB: 07/16/1945  Erica Crosby is a 76 y.o. year old female who is a primary care patient of [name], DO. The CCM team was consulted to assist the patient with chronic disease management and/or care coordination needs related to: [name] .   Engaged with patient by telephone for follow up visit in response to provider referral for social work chronic care management and care coordination services.   Consent to Services:  The patient was given information about Chronic Care Management services, agreed to services, and gave verbal consent prior to initiation of services.  Please see initial visit note for detailed documentation.   Patient agreed to services and consent obtained.   Assessment: Review of patient past medical history, allergies, medications, and health status, including review of relevant consultants reports was performed today as part of a comprehensive evaluation and provision of chronic care management and care coordination services.     SDOH (Social Determinants of Health) assessments and interventions performed:  SDOH Interventions    Flowsheet Row Most Recent Value  SDOH Interventions   Physical Activity Interventions Other (Comments)  [walking challenges. she uses a cane to help her walk]  Stress Interventions Provide Counseling  [client has stress related to walking challenges. client has pain issues.]  Depression Interventions/Treatment  Patient refuses Treatment        Advanced Directives Status: See [employer] application for related entries.  CCM Care Plan  No Known Allergies  Outpatient Encounter Medications as of 07/03/2021  Medication Sig   Accu-Chek Softclix Lancets lancets CHECK BLOOD SUGAR TWICE A DAY AND AS NEEDED Dx E11.21   Adalimumab (HUMIRA PEN) 40 MG/0.4ML PNKT Inject 40 mg into the skin every 14 (fourteen) days. Can restart  06/04/19   aspirin EC 81 MG tablet Take 81 mg by mouth daily.   atorvastatin (LIPITOR) 10 MG tablet Take 1 tablet (10 mg total) by mouth daily.   Blood Glucose Monitoring Suppl (ACCU-CHEK AVIVA PLUS) w/Device KIT Test BS BID and prn Dx E11.21   carvedilol (COREG) 12.5 MG tablet Take 1 tablet (12.5 mg total) by mouth 2 (two) times daily with a meal.   ciprofloxacin (CIPRO) 500 MG tablet Take 500 mg by mouth daily.   enalapril (VASOTEC) 20 MG tablet 1 tablet   folic acid (FOLVITE) 1 MG tablet Take 1 tablet (1 mg total) by mouth daily.   gabapentin (NEURONTIN) 400 MG capsule Take 1 capsule (400 mg total) by mouth at bedtime.   glucose blood (ACCU-CHEK AVIVA PLUS) test strip CHECK BLOOD SUGAR 2 TIMES A DAY AND AS NEEDED Dx E11.21   insulin glargine (LANTUS SOLOSTAR) 100 UNIT/ML Solostar Pen Inject 5-10 Units into the skin at bedtime.   leflunomide (ARAVA) 20 MG tablet Take 1 tablet (20 mg total) by mouth daily. Can restart 06/04/19   Omega-3 Fatty Acids (FISH OIL) 1000 MG CAPS    OPTIMAL-D 1.25 MG (50000 UT) capsule TAKE 1 CAPSULE BY MOUTH ONCE A WEEK FOR 8 DOSES   pantoprazole (PROTONIX) 40 MG tablet Take 1 tablet (40 mg total) by mouth daily.   No facility-administered encounter medications on file as of 07/03/2021.    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   Pneumonia due to COVID-19 virus 05/21/2019   Hyperlipidemia associated  with type 2 diabetes mellitus ([place]) 07/07/2018   Noncompliance with medication regimen 07/07/2018   Palpitations 05/07/2018   RA (rheumatoid arthritis) ([place]) 03/20/2017   Mixed hyperlipidemia 02/13/2017   Hypertension associated with stage 3 chronic kidney disease due to type 2 diabetes mellitus ([place]) 12/24/2016   Personal history of noncompliance with medical treatment, presenting hazards to health 12/24/2016   Bulging lumbar disc  08/22/2016   Syncope 02/10/2012   Ataxia 02/10/2012   Uncontrolled type 2 diabetes mellitus with diabetic nephropathy, with long-term current use of insulin 02/10/2012    Conditions to be addressed/monitored: monitor client completion of ADLs daily. Monitor client management of mobility needs  Care Plan : LCSW Care Plan  Updates made by Katha Cabal, LCSW since 07/03/2021 12:00 AM     Problem: Coping Skills (General Plan of Care)      Goal: Coping Skills Enhanced'Manage ADLS daily as able. Manage mobility needs   Start Date: 07/03/2021  Expected End Date: 09/25/2021  This Visit's Progress: On track  Recent Progress: On track  Priority: Medium  Note:   Current barriers:   Patient in need of assistance with connecting to community resources for possible help in completing her daily ADLs and other daily tasks Patient is unable to independently navigate community resource options without care coordination support Some mobility issues Vision issues Challenges in managing Diabetes May benefit from in home support of CNA  Clinical Goals:   LCSW to talk with client in next 30 days about client completion of ADLs and client management of mobility issues  Patient to call LCSW or RNCM as needed in next 30 days for CCM support Client to communicate with LCSW in next 30 days as needed to discuss in home care support resources for client  Clinical Interventions:  Collaboration with [name], DO regarding development and update of comprehensive plan of care as evidenced by provider attestation and co-signature Reviewed mood status of client. She said she gets a little sad sometimes and said she sometimes worries about her health needs.   Discussed client discussion with her family about Medicaid application for client  Client said she would talk with her daughter [name] and get phone number for niece, Nelta Numbers and call LCSW to give LCSW phone number for Nelta Numbers.  Client said that [name] will be helping client with client Medicaid application.  Discussed client mobility. She said she uses a cane to help her ambulate.  Reviewed medication procurement with client Reviewed family support. She has support from her daughter, [name].  [name] helps her get up and ready in the mornings.  Nedra manages client's bills and payment on client bills. Son of client is supportive. Niece of client is supportive. Encouraged client to call RNCM or LCSW as needed for CCM support Discussed pain issues of client. Discussed appetite of client.   Patient Strengths: Has support from her daughter Client attends scheduled medical appointments Takes medications as prescribed  Patient Deficits: Occasional help needed with completing ADLs Some mobility issues  Patient Goals:  Patient will attend scheduled medical appointments in next 30 days Patient will communicate with RNCM or LCSW as needed in nest 30 days for CCm support Patient will communicate regularly with her daughter in next 30 days to discuss the needs of client -  Follow Up Plan: LCSW to call client or her daughter , [name], on 07/10/21 at 10:00 AM to assess needs of client      [name].Emelin Dascenzo MSW, [employer] Licensed  Clinical Social Worker [facility] Care Management [phone]

## 2021-07-03 NOTE — Patient Instructions (Addendum)
Visit Information  Patient Goals:  [name] (Patient). Complete ADLs and other daily activities. Manage mobility needs  Timeframe:  Short-Term Goal Priority:  Medium Progress:  On Track Start Date:        07/03/21                Expected End Date:       09/25/21              Follow Up Date  07/10/21 at 10:00 AM   [name] (Patient) Complete ADLs and other daily activities . Manage mobility needs   Why is this important?   Screening tests can find diseases early when they are easier to treat.  Your doctor or nurse will talk with you about which tests are important for you.  Getting shots for common diseases like the flu and shingles will help prevent them.     Patient Strengths: Has support from her daughter Client attends scheduled medical appointments Takes medications as prescribed  Patient Deficits: Occasional help needed with completing ADLs Some mobility issues  Patient Goals:  Patient will attend scheduled medical appointments in next 30 days Patient will communicate with RNCM or LCSW as needed in next 30 days for CCM support Patient will communicate regularly with her daughter in next 30 days to discuss the needs of client -  Follow Up Plan: LCSW to call client or her daughter , Erica Crosby, on 07/10/21 at 10:00 AM to assess client needs.   [name]. Shea Evans, MSW, LCSW Licensed Clinical Social Worker [facility] Care Management [phone]

## 2021-07-04 ENCOUNTER — Ambulatory Visit: Payer: Medicare Other | Admitting: Licensed Clinical Social Worker

## 2021-07-04 DIAGNOSIS — M069 Rheumatoid arthritis, unspecified: Secondary | ICD-10-CM

## 2021-07-04 DIAGNOSIS — E782 Mixed hyperlipidemia: Secondary | ICD-10-CM

## 2021-07-04 DIAGNOSIS — R270 Ataxia, unspecified: Secondary | ICD-10-CM

## 2021-07-04 DIAGNOSIS — E1122 Type 2 diabetes mellitus with diabetic chronic kidney disease: Secondary | ICD-10-CM

## 2021-07-04 DIAGNOSIS — N184 Chronic kidney disease, stage 4 (severe): Secondary | ICD-10-CM

## 2021-07-04 NOTE — Chronic Care Management (AMB) (Signed)
Chronic Care Management    Clinical Social Work Note  07/04/2021 Name: Erica Crosby MRN: [id number] DOB: 1945-07-21  Erica Crosby is a 76 y.o. year old female who is a primary care patient of Erica Norlander, DO. The CCM team was consulted to assist the patient with chronic disease management and/or care coordination needs related to: [name] .   Engaged with patient / niece of patient, Erica Crosby, by telephone for follow up visit in response to provider referral for social work chronic care management and care coordination services.   Consent to Services:  The patient was given information about Chronic Care Management services, agreed to services, and gave verbal consent prior to initiation of services.  Please see initial visit note for detailed documentation.   Patient agreed to services and consent obtained.   Assessment: Review of patient past medical history, allergies, medications, and health status, including review of relevant consultants reports was performed today as part of a comprehensive evaluation and provision of chronic care management and care coordination services.     SDOH (Social Determinants of Health) assessments and interventions performed:  SDOH Interventions    Flowsheet Row Most Recent Value  SDOH Interventions   Physical Activity Interventions Other (Comments)  [walking challenges]  Stress Interventions Other (Comment)  [client has stress related to in home care needs]  Depression Interventions/Treatment  Patient refuses Treatment. Patient has been informed of LCSW support and of RNCM support        Advanced Directives Status: See Vynca application for related entries.  CCM Care Plan  No Known Allergies  Outpatient Encounter Medications as of 07/04/2021  Medication Sig   Accu-Chek Softclix Lancets lancets CHECK BLOOD SUGAR TWICE A DAY AND AS NEEDED Dx E11.21   Adalimumab (HUMIRA PEN) 40 MG/0.4ML PNKT Inject 40 mg into the skin  every 14 (fourteen) days. Can restart 06/04/19   aspirin EC 81 MG tablet Take 81 mg by mouth daily.   atorvastatin (LIPITOR) 10 MG tablet Take 1 tablet (10 mg total) by mouth daily.   Blood Glucose Monitoring Suppl (ACCU-CHEK AVIVA PLUS) w/Device KIT Test BS BID and prn Dx E11.21   carvedilol (COREG) 12.5 MG tablet Take 1 tablet (12.5 mg total) by mouth 2 (two) times daily with a meal.   ciprofloxacin (CIPRO) 500 MG tablet Take 500 mg by mouth daily.   enalapril (VASOTEC) 20 MG tablet 1 tablet   folic acid (FOLVITE) 1 MG tablet Take 1 tablet (1 mg total) by mouth daily.   gabapentin (NEURONTIN) 400 MG capsule Take 1 capsule (400 mg total) by mouth at bedtime.   glucose blood (ACCU-CHEK AVIVA PLUS) test strip CHECK BLOOD SUGAR 2 TIMES A DAY AND AS NEEDED Dx E11.21   insulin glargine (LANTUS SOLOSTAR) 100 UNIT/ML Solostar Pen Inject 5-10 Units into the skin at bedtime.   leflunomide (ARAVA) 20 MG tablet Take 1 tablet (20 mg total) by mouth daily. Can restart 06/04/19   Omega-3 Fatty Acids (FISH OIL) 1000 MG CAPS    OPTIMAL-D 1.25 MG (50000 UT) capsule TAKE 1 CAPSULE BY MOUTH ONCE A WEEK FOR 8 DOSES   pantoprazole (PROTONIX) 40 MG tablet Take 1 tablet (40 mg total) by mouth daily.   No facility-administered encounter medications on file as of 07/04/2021.    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 Manage mobility needs   Start Date: 07/03/2021  Expected End Date: 09/25/2021  This Visit's Progress: On track  Recent Progress: On track  Priority: Medium  Note:   Current barriers:   Patient in need of assistance with connecting to community resources for possible help in completing her daily ADLs and other daily tasks Patient is unable to independently navigate community resource options without care coordination support Some mobility issues Vision issues Challenges in managing Diabetes May benefit from in home support of CNA  Clinical Goals:   LCSW to talk with client in next 30 days about client completion of ADLs and client management of mobility issues  Patient to call LCSW or RNCM as needed in next 30 days for CCM support Client to communicate with LCSW in next 30 days as needed to discuss in home care support resources for client  Clinical Interventions:  Collaboration with Erica Norlander, DO regarding development and update of comprehensive plan of care as evidenced by provider attestation and co-signature Discussed  client needs with Erica Crosby, niece of client. Phone number of contact for Erica Crosby, niece, was [phone].  Discussed in home care needs of client with Erica Crosby Discussed PCS support through [facility]. Discussed Medicaid application process. Reviewed with Erica Crosby that Medicaid usually has a maximum threshold of monthly income that household could have to  qualify an applicant possibly for Medicaid. Erica Crosby said that client has her monthly income. Erica Crosby said that son of client, Erica Crosby, resides with client in the home. Erica Crosby said that Karma Greaser has his own income as well. Erica Crosby was not sure if client could qualify for Medicaid. LCSW encouraged Erica Crosby to call DSS Adult Medicaid Caseworker and talk with Caseworker at [place] about income limits for applicants to qualify for Medicaid.  Erica Crosby said she would call DSS Medicaid Caseworker to discuss income limits for [facility] applicants. Reviewed memory issues of client. Erica Crosby said client does have memory issues and that client is sometimes confused. LCSW and Erica Crosby talked of cooking safety for client. LCSW and Erica Crosby talked of client administration of client's medications. Reviewed family support for client. Client has support from her daughter, [name].  [name] helps her get up and ready in the mornings.  Nedra manages client's bills and payment on client bills. Son of client is supportive. Niece of client is supportive. Discussed CCM program support for client with Erica Crosby  Patient Strengths: Has support from her daughter Client attends scheduled medical appointments Takes medications as prescribed  Patient Deficits: Occasional help needed with completing ADLs Some mobility issues  Patient Goals:  Patient will attend scheduled medical appointments in next 30 days Patient will communicate with RNCM or LCSW as needed in nest 30 days for CCm support Patient will communicate regularly with her daughter in next 30 days to discuss the needs of client -  Follow Up Plan: LCSW to call client or her daughter , [name], on 07/10/21 at 10:00 AM to assess needs of client      [name].Tiari Andringa MSW, [place] Holiday representative [facility] Care Management [phone]

## 2021-07-04 NOTE — Patient Instructions (Addendum)
Visit Information  Patient goals: [name] (Patient). Complete ADLs and other daily activities. Manage mobility needs  Timeframe:  Short-Term Goal Priority:  Medium Progress:  On Track Start Date:        07/03/21                Expected End Date:       09/25/21              Follow Up Date  07/10/21 at 10:00 AM   [name] (Patient) Complete ADLs and other daily activities . Manage mobility needs   Why is this important?   Screening tests can find diseases early when they are easier to treat.  Your doctor or nurse will talk with you about which tests are important for you.  Getting shots for common diseases like the flu and shingles will help prevent them.     Patient Strengths: Has support from her daughter Client attends scheduled medical appointments Takes medications as prescribed  Patient Deficits: Occasional help needed with completing ADLs Some mobility issues  Patient Goals:  Patient will attend scheduled medical appointments in next 30 days Patient will communicate with RNCM or LCSW as needed in next 30 days for CCM support Patient will communicate regularly with her daughter in next 30 days to discuss the needs of client -  Follow Up Plan: LCSW to call client or her daughter , [name], on 07/10/21 at 10:00 AM to assess client needs.   [name].Sidnie Swalley MSW, [place] Holiday representative [facility] Care Management [phone]

## 2021-07-06 ENCOUNTER — Telehealth: Payer: Medicare Other

## 2021-07-07 DIAGNOSIS — E1165 Type 2 diabetes mellitus with hyperglycemia: Secondary | ICD-10-CM | POA: Diagnosis not present

## 2021-07-10 ENCOUNTER — Telehealth: Payer: Medicare Other

## 2021-07-11 ENCOUNTER — Ambulatory Visit: Payer: Medicare Other | Admitting: Licensed Clinical Social Worker

## 2021-07-11 DIAGNOSIS — I129 Hypertensive chronic kidney disease with stage 1 through stage 4 chronic kidney disease, or unspecified chronic kidney disease: Secondary | ICD-10-CM

## 2021-07-11 DIAGNOSIS — E782 Mixed hyperlipidemia: Secondary | ICD-10-CM

## 2021-07-11 DIAGNOSIS — R270 Ataxia, unspecified: Secondary | ICD-10-CM

## 2021-07-11 DIAGNOSIS — N184 Chronic kidney disease, stage 4 (severe): Secondary | ICD-10-CM

## 2021-07-11 DIAGNOSIS — M069 Rheumatoid arthritis, unspecified: Secondary | ICD-10-CM

## 2021-07-11 NOTE — Chronic Care Management (AMB) (Signed)
Chronic Care Management    Clinical Social Work Note  07/11/2021 Name: Erica Crosby MRN: [id number] DOB: [birth year]  Erica Crosby is a 76 y.o. year old female who is a primary care patient of [name], DO. The CCM team was consulted to assist the patient with chronic disease management and/or care coordination needs related to: [name] .   Engaged with patient / niece of client, Nelta Numbers, by telephone for follow up visit in response to provider referral for social work chronic care management and care coordination services.   Consent to Services:  The patient was given information about Chronic Care Management services, agreed to services, and gave verbal consent prior to initiation of services.  Please see initial visit note for detailed documentation.   Patient agreed to services and consent obtained.   Assessment: Review of patient past medical history, allergies, medications, and health status, including review of relevant consultants reports was performed today as part of a comprehensive evaluation and provision of chronic care management and care coordination services.     SDOH (Social Determinants of Health) assessments and interventions performed:  SDOH Interventions    Flowsheet Row Most Recent Value  SDOH Interventions   Physical Activity Interventions Other (Comments)  [client has some walking challenges]  Stress Interventions Provide Counseling  [client has stress related to managing medical needs]  Depression Interventions/Treatment  Patient refuses Treatment        Advanced Directives Status: See Vynca application for related entries.  CCM Care Plan  No Known Allergies  Outpatient Encounter Medications as of 07/11/2021  Medication Sig   Accu-Chek Softclix Lancets lancets CHECK BLOOD SUGAR TWICE A DAY AND AS NEEDED Dx E11.21   Adalimumab (HUMIRA PEN) 40 MG/0.4ML PNKT Inject 40 mg into the skin every 14 (fourteen) days. Can restart  06/04/19   aspirin EC 81 MG tablet Take 81 mg by mouth daily.   atorvastatin (LIPITOR) 10 MG tablet Take 1 tablet (10 mg total) by mouth daily.   Blood Glucose Monitoring Suppl (ACCU-CHEK AVIVA PLUS) w/Device KIT Test BS BID and prn Dx E11.21   carvedilol (COREG) 12.5 MG tablet Take 1 tablet (12.5 mg total) by mouth 2 (two) times daily with a meal.   ciprofloxacin (CIPRO) 500 MG tablet Take 500 mg by mouth daily.   enalapril (VASOTEC) 20 MG tablet 1 tablet   folic acid (FOLVITE) 1 MG tablet Take 1 tablet (1 mg total) by mouth daily.   gabapentin (NEURONTIN) 400 MG capsule Take 1 capsule (400 mg total) by mouth at bedtime.   glucose blood (ACCU-CHEK AVIVA PLUS) test strip CHECK BLOOD SUGAR 2 TIMES A DAY AND AS NEEDED Dx E11.21   insulin glargine (LANTUS SOLOSTAR) 100 UNIT/ML Solostar Pen Inject 5-10 Units into the skin at bedtime.   leflunomide (ARAVA) 20 MG tablet Take 1 tablet (20 mg total) by mouth daily. Can restart 06/04/19   Omega-3 Fatty Acids (FISH OIL) 1000 MG CAPS    OPTIMAL-D 1.25 MG (50000 UT) capsule TAKE 1 CAPSULE BY MOUTH ONCE A WEEK FOR 8 DOSES   pantoprazole (PROTONIX) 40 MG tablet Take 1 tablet (40 mg total) by mouth daily.   No facility-administered encounter medications on file as of 07/11/2021.    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   Pneumonia due to COVID-19 virus 05/21/2019   Hyperlipidemia associated with type  2 diabetes mellitus ([place]) 07/07/2018   Noncompliance with medication regimen 07/07/2018   Palpitations 05/07/2018   RA (rheumatoid arthritis) ([place]) 03/20/2017   Mixed hyperlipidemia 02/13/2017   Hypertension associated with stage 3 chronic kidney disease due to type 2 diabetes mellitus ([place]) 12/24/2016   Personal history of noncompliance with medical treatment, presenting hazards to health 12/24/2016   Bulging lumbar disc  08/22/2016   Syncope 02/10/2012   Ataxia 02/10/2012   Uncontrolled type 2 diabetes mellitus with diabetic nephropathy, with long-term current use of insulin 02/10/2012    Conditions to be addressed/monitored: monitor client completion of ADLs  Care Plan : [place]  Updates made by Katha Cabal, LCSW since 07/11/2021 12:00 AM     Problem: Coping Skills (General Plan of Care)      Goal: Coping Skills Enhanced'Manage ADLS daily as able. Manage mobility needs   Start Date: 07/11/2021  Expected End Date: 10/03/2021  This Visit's Progress: On track  Recent Progress: On track  Priority: Medium  Note:   Current barriers:   Patient in need of assistance with connecting to community resources for possible help in completing her daily ADLs and other daily tasks Patient is unable to independently navigate community resource options without care coordination support Some mobility issues Vision issues Challenges in managing Diabetes May benefit from in home support of CNA  Clinical Goals:   LCSW to talk with client in next 30 days about client completion of ADLs and client management of mobility issues  Patient to call LCSW or RNCM as needed in next 30 days for CCM support Client to communicate with LCSW in next 30 days as needed to discuss in home care support resources for client  Clinical Interventions:  Collaboration with [name], DO regarding development and update of comprehensive plan of care as evidenced by provider attestation and co-signature Discussed  client needs with Nelta Numbers, niece of client. Phone number of contact for Nelta Numbers, niece, was [phone].  Discussed in home care needs of client with Nelta Numbers Discussed  Leslie's communication with DSS about Medicaid eligibility for client. [name] said she called DSS caseworker a few days ago and is waiting on return call from [place] caseworker. Discussed client completion of ADLs. Discussed  appetite of client Reviewed walking challenges of client. Reviewed memory challenges of client Encouraged client or [name] to call RNCM as needed to discuss nursing needs of client. .  Patient Strengths: Has support from her daughter Client attends scheduled medical appointments Takes medications as prescribed  Patient Deficits: Occasional help needed with completing ADLs Some mobility issues  Patient Goals:  Patient will attend scheduled medical appointments in next 30 days Patient will communicate with RNCM or LCSW as needed in nest 30 days for CCm support Patient will communicate regularly with her daughter in next 30 days to discuss the needs of client -  Follow Up Plan: LCSW to call client or her daughter , [name], on 09/04/21 at 11:00 AM to assess needs of client      [name].Mckell Riecke MSW, [place] Holiday representative [facility] Care Management [phone]

## 2021-07-11 NOTE — Patient Instructions (Addendum)
Visit Information  Patient Goals:  [name] (Patient). Complete ADLs and other daily activities. Manage mobility needs  Timeframe:  Short-Term Goal Priority:  Medium Progress:  On Track Start Date:        07/11/21                Expected End Date:       10/03/21              Follow Up Date  09/04/21 at 11:00 AM    [name] (Patient) Complete ADLs and other daily activities . Manage mobility needs   Why is this important?   Screening tests can find diseases early when they are easier to treat.  Your doctor or nurse will talk with you about which tests are important for you.  Getting shots for common diseases like the flu and shingles will help prevent them.     Patient Strengths: Has support from her daughter Client attends scheduled medical appointments Takes medications as prescribed  Patient Deficits: Occasional help needed with completing ADLs Some mobility issues  Patient Goals:  Patient will attend scheduled medical appointments in next 30 days Patient will communicate with RNCM or LCSW as needed in next 30 days for CCM support Patient will communicate regularly with her daughter in next 30 days to discuss the needs of client -  Follow Up Plan: LCSW to call client or her daughter , Erica Crosby, on 09/04/21/ at 11:00 AM to assess client needs.   Erica Crosby.Princessa Lesmeister MSW, [place] Holiday representative [facility] Care Management [phone]

## 2021-07-18 DIAGNOSIS — E1122 Type 2 diabetes mellitus with diabetic chronic kidney disease: Secondary | ICD-10-CM

## 2021-07-18 DIAGNOSIS — M069 Rheumatoid arthritis, unspecified: Secondary | ICD-10-CM

## 2021-07-18 DIAGNOSIS — N183 Chronic kidney disease, stage 3 unspecified: Secondary | ICD-10-CM

## 2021-07-18 DIAGNOSIS — Z794 Long term (current) use of insulin: Secondary | ICD-10-CM

## 2021-07-18 DIAGNOSIS — I129 Hypertensive chronic kidney disease with stage 1 through stage 4 chronic kidney disease, or unspecified chronic kidney disease: Secondary | ICD-10-CM

## 2021-07-18 DIAGNOSIS — N184 Chronic kidney disease, stage 4 (severe): Secondary | ICD-10-CM

## 2021-07-18 DIAGNOSIS — E782 Mixed hyperlipidemia: Secondary | ICD-10-CM

## 2021-08-07 DIAGNOSIS — E1165 Type 2 diabetes mellitus with hyperglycemia: Secondary | ICD-10-CM | POA: Diagnosis not present

## 2021-08-23 ENCOUNTER — Telehealth: Payer: Self-pay | Admitting: Family Medicine

## 2021-09-04 ENCOUNTER — Ambulatory Visit (INDEPENDENT_AMBULATORY_CARE_PROVIDER_SITE_OTHER): Payer: Medicare Other | Admitting: Licensed Clinical Social Worker

## 2021-09-04 DIAGNOSIS — E1122 Type 2 diabetes mellitus with diabetic chronic kidney disease: Secondary | ICD-10-CM

## 2021-09-04 DIAGNOSIS — M069 Rheumatoid arthritis, unspecified: Secondary | ICD-10-CM

## 2021-09-04 DIAGNOSIS — R270 Ataxia, unspecified: Secondary | ICD-10-CM

## 2021-09-04 DIAGNOSIS — E782 Mixed hyperlipidemia: Secondary | ICD-10-CM

## 2021-09-04 NOTE — Patient Instructions (Addendum)
Visit Information ? ?Patient Goals:  [name] (Patient); Complete ADLs and other daily activities. Manage mobility needs ? ?Timeframe:  Short-Term Goal ?Priority:  Medium ?Progress:  On Track ?Start Date:        09/04/21                  ?Expected End Date:       11/30/21             ? ?Follow Up Date  10/30/21 at 10:00 AM ?  ?[name] (Patient) Complete ADLs and other daily activities . Manage mobility needs ?  ?Why is this important?   ?Screening tests can find diseases early when they are easier to treat.  ?Your doctor or nurse will talk with you about which tests are important for you.  ?Getting shots for common diseases like the flu and shingles will help prevent them.   ?  ?Patient Strengths: ?Has support from her daughter ?Client attends scheduled medical appointments ?Takes medications as prescribed ? ?Patient Deficits: ?Occasional help needed with completing ADLs ?Some mobility issues ? ?Patient Goals:  ?Patient will attend scheduled medical appointments in next 30 days ?Patient will communicate with RNCM or LCSW as needed in next 30 days for CCM support ?Patient will communicate regularly with her daughter in next 30 days to discuss the needs of client ?-  ?Follow Up Plan: LCSW to call client or her niece, Erica Crosby, on 10/30/21 at 10:00 AM  to assess client needs.  ? ?[name].Erica Crosby MSW, LCSW ?Licensed Clinical Social Worker ?[place] Management ?[phone] ?

## 2021-09-04 NOTE — Chronic Care Management (AMB) (Signed)
?Chronic Care Management  ? ? Clinical Social Work Note ? ?09/04/2021 ?Name: Erica Crosby MRN: [id number] DOB: 21-Jul-1945 ? ?Erica Crosby is a 76 y.o. year old female who is a primary care patient of [name], DO. The CCM team was consulted to assist the patient with chronic disease management and/or care coordination needs related to: [name] .  ? ?Engaged with patient / niece of patient, Nelta Numbers, by telephone for follow up visit in response to provider referral for social work chronic care management and care coordination services.  ? ?Consent to Services:  ?The patient was given information about Chronic Care Management services, agreed to services, and gave verbal consent prior to initiation of services.  Please see initial visit note for detailed documentation.  ? ?Patient agreed to services and consent obtained.  ? ?Assessment: Review of patient past medical history, allergies, medications, and health status, including review of relevant consultants reports was performed today as part of a comprehensive evaluation and provision of chronic care management and care coordination services.    ? ?SDOH (Social Determinants of Health) assessments and interventions performed:  ?SDOH Interventions   ? ?Flowsheet Row Most Recent Value  ?SDOH Interventions   ?Physical Activity Interventions Other (Comments)  [walking challenges. uses a cane to help her walk]  ?Stress Interventions Other (Comment)  [client has some stress related to managing in home care needs]  ?Depression Interventions/Treatment  Counseling  ? ?  ?  ? ?Advanced Directives Status: See [employer]yn[employer] application for related entries. ? ?CCM Care Plan ? ?No Known Allergies ? ?Outpatient Encounter Medications as of 09/04/2021  ?Medication Sig  ? Accu-Chek Softclix Lancets lancets CHECK BLOOD SUGAR TWICE A DAY AND AS NEEDED Dx E11.21  ? Adalimumab (HUMIRA PEN) 40 MG/0.4ML PNKT Inject 40 mg into the skin every 14 (fourteen) days. Can restart  06/04/19  ? aspirin EC 81 MG tablet Take 81 mg by mouth daily.  ? atorvastatin (LIPITOR) 10 MG tablet Take 1 tablet (10 mg total) by mouth daily.  ? Blood Glucose Monitoring Suppl (ACCU-CHEK AVIVA PLUS) w/Device KIT Test BS BID and prn Dx E11.21  ? carvedilol (COREG) 12.5 MG tablet Take 1 tablet (12.5 mg total) by mouth 2 (two) times daily with a meal.  ? ciprofloxacin (CIPRO) 500 MG tablet Take 500 mg by mouth daily.  ? enalapril (VASOTEC) 20 MG tablet 1 tablet  ? folic acid (FOLVITE) 1 MG tablet Take 1 tablet (1 mg total) by mouth daily.  ? gabapentin (NEURONTIN) 400 MG capsule Take 1 capsule (400 mg total) by mouth at bedtime.  ? glucose blood (ACCU-CHEK AVIVA PLUS) test strip CHECK BLOOD SUGAR 2 TIMES A DAY AND AS NEEDED Dx E11.21  ? insulin glargine (LANTUS SOLOSTAR) 100 UNIT/ML Solostar Pen Inject 5-10 Units into the skin at bedtime.  ? leflunomide (ARAVA) 20 MG tablet Take 1 tablet (20 mg total) by mouth daily. Can restart 06/04/19  ? Omega-3 Fatty Acids (FISH OIL) 1000 MG CAPS   ? OPTIMAL-D 1.25 MG (50000 UT) capsule TAKE 1 CAPSULE BY MOUTH ONCE A WEEK FOR 8 DOSES  ? pantoprazole (PROTONIX) 40 MG tablet Take 1 tablet (40 mg total) by mouth daily.  ? ?No facility-administered encounter medications on file as of 09/04/2021.  ? ? ?Patient Active Problem List  ? Diagnosis Date Noted  ? Osteoporosis 03/08/2021  ? Other long term (current) drug therapy 03/08/2021  ? 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 Pneumonia due to COVID-19 virus 05/21/2019  ?  Hyperlipidemia associated with type 2 diabetes mellitus ([place]) 07/07/2018  ? Noncompliance with medication regimen 07/07/2018  ? Palpitations 05/07/2018  ? RA (rheumatoid arthritis) ([place]) 03/20/2017  ? Mixed hyperlipidemia 02/13/2017  ? 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 Personal history of noncompliance with medical treatment, presenting hazards to health 12/24/2016  ? Bulging lumbar disc  08/22/2016  ? Syncope 02/10/2012  ? Ataxia 02/10/2012  ? Uncontrolled type 2 diabetes mellitus with diabetic nephropathy, with long-term current use of insulin 02/10/2012  ? ? ?Conditions to be addressed/monitored: monitor client completion of daily ADLs ? ?Care Plan : LCSW Care Plan  ?Updates made by Katha Cabal, LCSW since 09/04/2021 12:00 AM  ?  ? ?Problem: Coping Skills (General Plan of Care)   ?  ? ?Goal: Coping Skills Enhanced'Manage ADLS daily as able. Manage mobility needs   ?Start Date: 09/04/2021  ?Expected End Date: 11/30/2021  ?This Visit's Progress: On track  ?Recent Progress: On track  ?Priority: Medium  ?Note:   ?Current barriers:   ?Patient in need of assistance with connecting to community resources for possible help in completing her daily ADLs and other daily tasks ?Patient is unable to independently navigate community resource options without care coordination support ?Some mobility issues ?Vision issues ?Challenges in managing Diabetes ?May benefit from in home support of CNA ? ?Clinical Goals:  ? ?LCSW to talk with client in next 30 days about client completion of ADLs and client management of mobility issues  ?Patient to call LCSW or RNCM as needed in next 30 days for CCM support ?Client to communicate with LCSW in next 30 days as needed to discuss in home care support resources for client ? ?Clinical Interventions:  ?Collaboration with [name], DO regarding development and update of comprehensive plan of care as evidenced by provider attestation and co-signature ?Discussed  client needs with Nelta Numbers, niece of client. Phone number of contact for Nelta Numbers, niece, was [phone].  ?Discussed in home care needs of client with Nelta Numbers ?Discussed  Leslie's communication with DSS about Medicaid eligibility for client. [name] said she spoke via phone with DSS caseworker regarding Medicaid for client. [name] said that household income for client was too high for  client to qualify for Medicaid.  Family is considering other in home care options for client. ?Discussed client completion of ADLs. Discussed appetite of client ?Reviewed walking challenges of client. Client uses a cane to help her walk ?Encouraged client or [name] to call RNCM as needed to discuss nursing needs of client. Marland Kitchen ?Reviewed sleeping issues of client. Reviewed socialization of client. [name] said that client and her sister enjoy doing activities together. LCSW encouraged such socialization of client and her sister (for example , shopping with one another,going to hair appointment) ?Discussed family support for client ? ?Patient Strengths: ?Has support from her daughter ?Client attends scheduled medical appointments ?Takes medications as prescribed ? ?Patient Deficits: ?Occasional help needed with completing ADLs ?Some mobility issues ? ?Patient Goals:  ?Patient will attend scheduled medical appointments in next 30 days ?Patient will communicate with RNCM or LCSW as needed in nest 30 days for CCm support ?Patient will communicate regularly with her daughter in next 30 days to discuss the needs of client ?-  ?Follow Up Plan: LCSW to call client or her niece, [name], on 10/30/21 at 10:00 AM to assess needs of client   ?  ?  ?[name]. MSW, LCSW ?  Licensed Clinical Social Worker ?[facility] ?[phone] ?

## 2021-09-06 DIAGNOSIS — E1122 Type 2 diabetes mellitus with diabetic chronic kidney disease: Secondary | ICD-10-CM | POA: Diagnosis not present

## 2021-09-06 DIAGNOSIS — N179 Acute kidney failure, unspecified: Secondary | ICD-10-CM | POA: Diagnosis not present

## 2021-09-06 DIAGNOSIS — N189 Chronic kidney disease, unspecified: Secondary | ICD-10-CM | POA: Diagnosis not present

## 2021-09-06 DIAGNOSIS — I129 Hypertensive chronic kidney disease with stage 1 through stage 4 chronic kidney disease, or unspecified chronic kidney disease: Secondary | ICD-10-CM | POA: Diagnosis not present

## 2021-09-06 DIAGNOSIS — N183 Chronic kidney disease, stage 3 unspecified: Secondary | ICD-10-CM | POA: Diagnosis not present

## 2021-09-06 DIAGNOSIS — E1129 Type 2 diabetes mellitus with other diabetic kidney complication: Secondary | ICD-10-CM | POA: Diagnosis not present

## 2021-09-06 DIAGNOSIS — R634 Abnormal weight loss: Secondary | ICD-10-CM | POA: Diagnosis not present

## 2021-09-06 DIAGNOSIS — N390 Urinary tract infection, site not specified: Secondary | ICD-10-CM | POA: Diagnosis not present

## 2021-09-06 DIAGNOSIS — M069 Rheumatoid arthritis, unspecified: Secondary | ICD-10-CM | POA: Diagnosis not present

## 2021-09-07 DIAGNOSIS — L84 Corns and callosities: Secondary | ICD-10-CM | POA: Diagnosis not present

## 2021-09-07 DIAGNOSIS — M79676 Pain in unspecified toe(s): Secondary | ICD-10-CM | POA: Diagnosis not present

## 2021-09-07 DIAGNOSIS — B351 Tinea unguium: Secondary | ICD-10-CM | POA: Diagnosis not present

## 2021-09-07 DIAGNOSIS — E1142 Type 2 diabetes mellitus with diabetic polyneuropathy: Secondary | ICD-10-CM | POA: Diagnosis not present

## 2021-09-13 NOTE — Telephone Encounter (Signed)
Libre refills sent to [facility] to see if covered there ? ?

## 2021-09-14 ENCOUNTER — Encounter: Payer: Self-pay | Admitting: Family Medicine

## 2021-09-14 ENCOUNTER — Telehealth: Payer: Self-pay | Admitting: Family Medicine

## 2021-09-14 ENCOUNTER — Ambulatory Visit (INDEPENDENT_AMBULATORY_CARE_PROVIDER_SITE_OTHER): Payer: Medicare Other | Admitting: Family Medicine

## 2021-09-14 VITALS — BP 166/85 | HR 73 | Temp 98.2°F | Ht 67.0 in | Wt 144.6 lb

## 2021-09-14 DIAGNOSIS — Z794 Long term (current) use of insulin: Secondary | ICD-10-CM

## 2021-09-14 DIAGNOSIS — N184 Chronic kidney disease, stage 4 (severe): Secondary | ICD-10-CM | POA: Diagnosis not present

## 2021-09-14 DIAGNOSIS — J309 Allergic rhinitis, unspecified: Secondary | ICD-10-CM | POA: Diagnosis not present

## 2021-09-14 DIAGNOSIS — E1122 Type 2 diabetes mellitus with diabetic chronic kidney disease: Secondary | ICD-10-CM

## 2021-09-14 DIAGNOSIS — I129 Hypertensive chronic kidney disease with stage 1 through stage 4 chronic kidney disease, or unspecified chronic kidney disease: Secondary | ICD-10-CM | POA: Diagnosis not present

## 2021-09-14 DIAGNOSIS — R296 Repeated falls: Secondary | ICD-10-CM

## 2021-09-14 LAB — BAYER DCA HB A1C WAIVED: HB A1C (BAYER DCA - WAIVED): 6.9 % — ABNORMAL HIGH (ref 4.8–5.6)

## 2021-09-14 MED ORDER — FLUTICASONE PROPIONATE 50 MCG/ACT NA SUSP: 2.0000 | Freq: Every day | NASAL | 6 refills | Status: DC

## 2021-09-14 NOTE — Progress Notes (Signed)
? ?Subjective: ?CC:DM ?PCP: [name], DO ?HPI:Erica Crosby is a 76 y.o. female presenting to clinic today for: ? ?1. Type 2 Diabetes with hypertension, hyperlipidemia associated with CKD4:  ?She reports that the carvedilol was discontinued recently.  Apparently her renal function is looking really bad and she is scheduled for an iron infusion soon.  Patient notes that her blood sugar got really low recently such that she fell.  Continues to have intermittent hypoglycemic episodes overnight.  Only injects 5 to 10 units if her blood sugars are over 300s. ? ?Last eye exam: Scheduled next week ?Last foot exam: Needs ?Last A1c:  ?Lab Results  ?Component Value Date  ? HGBA1C 6.0 10/14/2020  ? ?Nephropathy screen indicated?:  Has known CKD 4 ?Last flu, zoster and/or pneumovax:  ?Immunization History  ?Administered Date(s) Administered  ? Influenza Split 04/05/2010  ? Influenza, High Dose Seasonal PF 03/11/2018, 02/19/2019  ? Influenza,inj,Quad PF,6+ Mos 03/18/2016, 03/20/2017  ? Influenza-Unspecified 03/02/2021  ? Moderna Sars-Covid-2 Vaccination 07/24/2019, 08/21/2019, 05/11/2020  ? Pneumococcal Conjugate-13 04/21/2016  ? Pneumococcal Polysaccharide-23 04/05/2010, 08/14/2017, 01/03/2018  ? Tdap 11/05/2009  ? Zoster Recombinat (Shingrix) 01/03/2018, 03/11/2018  ? ? ?ROS: No chest pain.  She had some edema but that is getting better.  No shortness of breath.  Reports some nasal congestion ? ?ROS: Per HPI ? ?No Known Allergies ?Past Medical History:  ?Diagnosis Date  ? Anemia   ? Arthritis   ? Cataract   ? Coronary artery disease   ? Mild plaque 2012  ? Diabetes mellitus   ? Essential hypertension 02/10/2012  ? GERD (gastroesophageal reflux disease)   ? Hyperlipidemia   ? Hypertension   ? Neuropathy   ? Peptic ulcer   ? Rheumatoid arthritis ([place])   ? TIA (transient ischemic attack) 02/10/2012  ? ? ?Current Outpatient Medications:  ?  Accu-Chek Softclix Lancets lancets, CHECK BLOOD SUGAR TWICE A DAY AND AS NEEDED Dx  E11.21, Disp: 200 each, Rfl: 3 ?  Adalimumab (HUMIRA PEN) 40 MG/0.4ML PNKT, Inject 40 mg into the skin every 14 (fourteen) days. Can restart 06/04/19, Disp: 2 each, Rfl:  ?  aspirin EC 81 MG tablet, Take 81 mg by mouth daily., Disp: , Rfl:  ?  atorvastatin (LIPITOR) 10 MG tablet, Take 1 tablet (10 mg total) by mouth daily., Disp: 90 tablet, Rfl: 3 ?  Blood Glucose Monitoring Suppl (ACCU-CHEK AVIVA PLUS) w/Device KIT, Test BS BID and prn Dx E11.21, Disp: 1 kit, Rfl: 0 ?  enalapril (VASOTEC) 20 MG tablet, 1 tablet, Disp: , Rfl:  ?  folic acid (FOLVITE) 1 MG tablet, Take 1 tablet (1 mg total) by mouth daily., Disp: 90 tablet, Rfl: 3 ?  gabapentin (NEURONTIN) 400 MG capsule, Take 1 capsule (400 mg total) by mouth at bedtime., Disp: 90 capsule, Rfl: 3 ?  glucose blood (ACCU-CHEK AVIVA PLUS) test strip, CHECK BLOOD SUGAR 2 TIMES A DAY AND AS NEEDED Dx E11.21, Disp: 200 strip, Rfl: 3 ?  insulin glargine (LANTUS SOLOSTAR) 100 UNIT/ML Solostar Pen, Inject 5-10 Units into the skin at bedtime., Disp: 15 mL, Rfl: 2 ?  leflunomide (ARAVA) 20 MG tablet, Take 1 tablet (20 mg total) by mouth daily. Can restart 06/04/19, Disp:  , Rfl:  ?  Omega-3 Fatty Acids (FISH OIL) 1000 MG CAPS, , Disp: , Rfl:  ?  OPTIMAL-D 1.25 MG (50000 UT) capsule, TAKE 1 CAPSULE BY MOUTH ONCE A WEEK FOR 8 DOSES, Disp: 8 capsule, Rfl: 0 ?  pantoprazole (PROTONIX)  40 MG tablet, Take 1 tablet (40 mg total) by mouth daily., Disp: 90 tablet, Rfl: 3 ?Social History  ? ?Socioeconomic History  ? Marital status: Widowed  ?  Spouse name: Not on file  ? Number of children: 3  ? Years of education: 48  ? Highest education level: High school graduate  ?Occupational History  ? Occupation: retired  ?Tobacco Use  ? Smoking status: Former  ?  Years: 25.00  ?  Types: Cigarettes, E-cigarettes  ?  Quit date: 02/02/2013  ?  Years since quitting: 8.6  ? Smokeless tobacco: Never  ?Vaping Use  ? Vaping Use: Never used  ?Substance and Sexual Activity  ? Alcohol use: No  ? Drug use:  No  ? Sexual activity: Not Currently  ?  Birth control/protection: Post-menopausal  ?Other Topics Concern  ? Not on file  ?Social History Narrative  ? Daughter stays with her.   ? Son comes daily when daughter is working  ? ?Social Determinants of Health  ? ?Financial Resource Strain: Medium Risk  ? Difficulty of Paying Living Expenses: Somewhat hard  ?Food Insecurity: Food Insecurity Present  ? Worried About Charity fundraiser in the Last Year: Sometimes true  ? Ran Out of Food in the Last Year: Never true  ?Transportation Needs: Unmet Transportation Needs  ? Lack of Transportation (Medical): Yes  ? Lack of Transportation (Non-Medical): Yes  ?Physical Activity: Inactive  ? Days of Exercise per Week: 0 days  ? Minutes of Exercise per Session: 0 min  ?Stress: Stress Concern Present  ? Feeling of Stress : To some extent  ?Social Connections: Socially Isolated  ? Frequency of Communication with Friends and Family: More than three times a week  ? Frequency of Social Gatherings with Friends and Family: Once a week  ? Attends Religious Services: Never  ? Active Member of Clubs or Organizations: No  ? Attends Archivist Meetings: Never  ? Marital Status: Widowed  ?Intimate Partner Violence: Not At Risk  ? Fear of Current or Ex-Partner: No  ? Emotionally Abused: No  ? Physically Abused: No  ? Sexually Abused: No  ? ?Family History  ?Problem Relation Age of Onset  ? Heart disease Sister   ?     Congeital (died age 63) with "enlarged heart"  ? CAD Sister   ? Diabetes Mother   ? Heart disease Mother   ?     Later onset heart disease   ? Dementia Mother   ? Alcohol abuse Father   ? Liver disease Father   ? CAD Sister 46  ?     CABG  ? Early death Sister   ? Diabetes Brother   ? Kidney disease Brother   ?     related to DM  ? Diabetes Brother   ? ? ?Objective: ?Office vital signs reviewed. ?BP (!) 166/85   Pulse 73   Temp 98.2 ?F (36.8 ?C)   Ht '5\' 7"'  (1.702 m)   Wt 144 lb 9.6 oz (65.6 kg)   SpO2 99%   BMI 22.65  kg/m?  ? ?Physical Examination:  ?General: Awake, alert, nontoxic elderly female, No acute distress ?HEENT: [place], atraumatic ?Cardio: regular rate and rhythm, S1S2 heard, no murmurs appreciated ?Pulm: Mild expiratory wheezes noted at the bases ?MSK: Unsteady gait, ambulating with use of cane ?Neuro: Poor memory but follows commands ? ?Assessment/ Plan: ?76 y.o. female  ? ?Controlled type 2 diabetes mellitus with stage 4 chronic kidney disease, with  long-term current use of insulin (HCC) - Plan: Bayer DCA Hb A1c Waived, CANCELED: Bayer DCA Hb A1c Waived ? 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 ?Frequent falls ? ?Nasal sinus inflammation due to allergy - Plan: fluticasone (FLONASE) 50 MCG/ACT nasal spray ? ?Check A1c.  She will meet with Almyra Free today to talk about freestyle libre.  A1c was 6.9 and considered within normal range for this patient.  Again, I would favor reduction in insulin ? ?Blood pressure not controlled upon initial checkup.  I have advised her to return in 1 to 2 weeks for blood pressure check with nurse ? ?Concern about frequent falls.  If this is related to hypoglycemia I would rather her not utilize the insulin. ? ?Flonase nasal spray sent ? ?No orders of the defined types were placed in this encounter. ? ?No orders of the defined types were placed in this encounter. ? ? ? ?[name], DO ?[place] ?[phone] ? ? ?

## 2021-09-14 NOTE — Telephone Encounter (Signed)
Aware and printed for appt  ?

## 2021-09-18 ENCOUNTER — Ambulatory Visit (INDEPENDENT_AMBULATORY_CARE_PROVIDER_SITE_OTHER): Payer: Medicare Other | Admitting: *Deleted

## 2021-09-18 DIAGNOSIS — E1122 Type 2 diabetes mellitus with diabetic chronic kidney disease: Secondary | ICD-10-CM

## 2021-09-20 ENCOUNTER — Other Ambulatory Visit (HOSPITAL_COMMUNITY): Payer: Self-pay | Admitting: *Deleted

## 2021-09-21 ENCOUNTER — Encounter (HOSPITAL_COMMUNITY)
Admission: RE | Admit: 2021-09-21 | Discharge: 2021-09-21 | Disposition: A | Discharge status: Home / Self Care | Payer: Medicare Other | Source: Ambulatory Visit | Attending: Nephrology | Admitting: Nephrology

## 2021-09-21 DIAGNOSIS — N189 Chronic kidney disease, unspecified: Secondary | ICD-10-CM | POA: Insufficient documentation

## 2021-09-21 DIAGNOSIS — D631 Anemia in chronic kidney disease: Secondary | ICD-10-CM | POA: Diagnosis not present

## 2021-09-21 MED ORDER — SODIUM CHLORIDE 0.9 % IV SOLN
510.0000 mg | INTRAVENOUS | Status: DC
  Administered 2021-09-21: 510 mg via INTRAVENOUS
  Filled 2021-09-21: qty 17

## 2021-09-28 ENCOUNTER — Encounter (HOSPITAL_COMMUNITY): Payer: Medicare Other

## 2021-09-29 ENCOUNTER — Encounter (HOSPITAL_COMMUNITY)
Admission: RE | Admit: 2021-09-29 | Discharge: 2021-09-29 | Disposition: A | Discharge status: Home / Self Care | Payer: Medicare Other | Source: Ambulatory Visit | Attending: Nephrology | Admitting: Nephrology

## 2021-09-29 DIAGNOSIS — N189 Chronic kidney disease, unspecified: Secondary | ICD-10-CM | POA: Diagnosis not present

## 2021-09-29 DIAGNOSIS — D631 Anemia in chronic kidney disease: Secondary | ICD-10-CM | POA: Diagnosis not present

## 2021-09-29 MED ORDER — SODIUM CHLORIDE 0.9 % IV SOLN
510.0000 mg | INTRAVENOUS | Status: DC
  Administered 2021-09-29: 510 mg via INTRAVENOUS
  Filled 2021-09-29: qty 510

## 2021-10-06 DIAGNOSIS — N183 Chronic kidney disease, stage 3 unspecified: Secondary | ICD-10-CM | POA: Diagnosis not present

## 2021-10-10 DIAGNOSIS — R634 Abnormal weight loss: Secondary | ICD-10-CM | POA: Diagnosis not present

## 2021-10-10 DIAGNOSIS — E1122 Type 2 diabetes mellitus with diabetic chronic kidney disease: Secondary | ICD-10-CM | POA: Diagnosis not present

## 2021-10-10 DIAGNOSIS — N179 Acute kidney failure, unspecified: Secondary | ICD-10-CM | POA: Diagnosis not present

## 2021-10-10 DIAGNOSIS — N183 Chronic kidney disease, stage 3 unspecified: Secondary | ICD-10-CM | POA: Diagnosis not present

## 2021-10-10 DIAGNOSIS — M069 Rheumatoid arthritis, unspecified: Secondary | ICD-10-CM | POA: Diagnosis not present

## 2021-10-10 DIAGNOSIS — I129 Hypertensive chronic kidney disease with stage 1 through stage 4 chronic kidney disease, or unspecified chronic kidney disease: Secondary | ICD-10-CM | POA: Diagnosis not present

## 2021-10-15 DIAGNOSIS — E1122 Type 2 diabetes mellitus with diabetic chronic kidney disease: Secondary | ICD-10-CM

## 2021-10-15 DIAGNOSIS — I129 Hypertensive chronic kidney disease with stage 1 through stage 4 chronic kidney disease, or unspecified chronic kidney disease: Secondary | ICD-10-CM

## 2021-10-15 DIAGNOSIS — Z794 Long term (current) use of insulin: Secondary | ICD-10-CM | POA: Diagnosis not present

## 2021-10-15 DIAGNOSIS — N184 Chronic kidney disease, stage 4 (severe): Secondary | ICD-10-CM | POA: Diagnosis not present

## 2021-10-16 ENCOUNTER — Telehealth: Payer: Medicare Other | Admitting: *Deleted

## 2021-10-16 ENCOUNTER — Telehealth: Payer: Self-pay | Admitting: *Deleted

## 2021-10-27 ENCOUNTER — Telehealth: Payer: Self-pay | Admitting: Family Medicine

## 2021-10-27 NOTE — Telephone Encounter (Signed)
?  Prescription Request ? ?10/27/2021 ? ?Is this a "Controlled Substance" medicine? no ? ?Have you seen your PCP in the last 2 weeks? no ? ?If YES, route message to pool  -  If NO, patient needs to be scheduled for appointment. ? ?What is the name of the medication or equipment? [facility] supplies ? ?Have you contacted your pharmacy to request a refill? no  ? ?Which pharmacy would you like this sent to? Not sure, but it is mail order ? ? ?Patient notified that their request is being sent to the clinical staff for review and that they should receive a response within 2 business days.  ?  ?[facility] pt. ? ?Please call daughter. ?

## 2021-10-30 ENCOUNTER — Ambulatory Visit (INDEPENDENT_AMBULATORY_CARE_PROVIDER_SITE_OTHER): Payer: Medicare Other | Admitting: Licensed Clinical Social Worker

## 2021-10-30 DIAGNOSIS — E782 Mixed hyperlipidemia: Secondary | ICD-10-CM

## 2021-10-30 DIAGNOSIS — E1122 Type 2 diabetes mellitus with diabetic chronic kidney disease: Secondary | ICD-10-CM

## 2021-10-30 DIAGNOSIS — R7309 Other abnormal glucose: Secondary | ICD-10-CM

## 2021-10-30 DIAGNOSIS — N183 Chronic kidney disease, stage 3 unspecified: Secondary | ICD-10-CM

## 2021-10-30 DIAGNOSIS — M069 Rheumatoid arthritis, unspecified: Secondary | ICD-10-CM

## 2021-10-30 DIAGNOSIS — R270 Ataxia, unspecified: Secondary | ICD-10-CM

## 2021-10-30 NOTE — Patient Instructions (Addendum)
Visit Information ? ?Patient Goals:  [name] (Patient).  Complete ADLs and other daily activities. Manage mobility needs ? ?Timeframe:  Short-Term Goal ?Priority:  Medium ?Progress:  On Track ?Start Date:        09/04/21                  ?Expected End Date:       01/09/22               ? ?Follow Up Date  12/11/21 at 1:00 PM ?  ?[name] (Patient) Complete ADLs and other daily activities . Manage mobility needs ?  ?Why is this important?   ?Screening tests can find diseases early when they are easier to treat.  ?Your doctor or nurse will talk with you about which tests are important for you.  ?Getting shots for common diseases like the flu and shingles will help prevent them.   ?  ?Patient Strengths: ?Has support from her daughter ?Client attends scheduled medical appointments ?Takes medications as prescribed ? ?Patient Deficits: ?Occasional help needed with completing ADLs ?Some mobility issues ? ?Patient Goals:  ?Patient will attend scheduled medical appointments in next 30 days ?Patient will communicate with RNCM or LCSW as needed in next 30 days for CCM support ?Patient will communicate regularly with her daughter in next 30 days to discuss the needs of client ?-  ?Follow Up Plan: LCSW to call client on 12/11/21 at 1:00 PM to assess client needs.  ? ?[name].Amayiah Gosnell MSW, LCSW ?Licensed Clinical Social Worker ?[facility] ?[phone]  ?

## 2021-10-30 NOTE — Chronic Care Management (AMB) (Signed)
?Chronic Care Management  ? ? Clinical Social Work Note ? ?10/30/2021 ?Name: Erica Crosby MRN: [id number] DOB: 1946/02/26 ? ?Erica Crosby is a 76 y.o. year old female who is a primary care patient of [name], DO. The CCM team was consulted to assist the patient with chronic disease management and/or care coordination needs related to: [name] .  ? ?Engaged with patient by telephone for follow up visit in response to provider referral for social work chronic care management and care coordination services.  ? ?Consent to Services:  ?The patient was given information about Chronic Care Management services, agreed to services, and gave verbal consent prior to initiation of services.  Please see initial visit note for detailed documentation.  ? ?Patient agreed to services and consent obtained.  ? ?Assessment: Review of patient past medical history, allergies, medications, and health status, including review of relevant consultants reports was performed today as part of a comprehensive evaluation and provision of chronic care management and care coordination services.    ? ?SDOH (Social Determinants of Health) assessments and interventions performed:  ?SDOH Interventions   ? ?Flowsheet Row Most Recent Value  ?SDOH Interventions   ?Physical Activity Interventions Other (Comments)  [walking challenges. uses a cane to help her walk]  ?Stress Interventions Provide Counseling  [has stress related to managing medical needs]  ?Depression Interventions/Treatment  Counseling  ? ?  ?  ? ?Advanced Directives Status: See Vyn[employer] application for related entries. ? ?CCM Care Plan ? ?No Known Allergies ? ?Outpatient Encounter Medications as of 10/30/2021  ?Medication Sig  ? Accu-Chek Softclix Lancets lancets CHECK BLOOD SUGAR TWICE A DAY AND AS NEEDED Dx E11.21  ? Adalimumab (HUMIRA PEN) 40 MG/0.4ML PNKT Inject 40 mg into the skin every 14 (fourteen) days. Can restart 06/04/19  ? aspirin EC 81 MG tablet Take 81 mg by  mouth daily.  ? atorvastatin (LIPITOR) 10 MG tablet Take 1 tablet (10 mg total) by mouth daily.  ? Blood Glucose Monitoring Suppl (ACCU-CHEK AVIVA PLUS) w/Device KIT Test BS BID and prn Dx E11.21  ? enalapril (VASOTEC) 20 MG tablet 1 tablet  ? fluticasone (FLONASE) 50 MCG/ACT nasal spray Place 2 sprays into both nostrils daily.  ? folic acid (FOLVITE) 1 MG tablet Take 1 tablet (1 mg total) by mouth daily.  ? gabapentin (NEURONTIN) 400 MG capsule Take 1 capsule (400 mg total) by mouth at bedtime.  ? glucose blood (ACCU-CHEK AVIVA PLUS) test strip CHECK BLOOD SUGAR 2 TIMES A DAY AND AS NEEDED Dx E11.21  ? insulin glargine (LANTUS SOLOSTAR) 100 UNIT/ML Solostar Pen Inject 5-10 Units into the skin at bedtime.  ? leflunomide (ARAVA) 20 MG tablet Take 1 tablet (20 mg total) by mouth daily. Can restart 06/04/19  ? Omega-3 Fatty Acids (FISH OIL) 1000 MG CAPS   ? OPTIMAL-D 1.25 MG (50000 UT) capsule TAKE 1 CAPSULE BY MOUTH ONCE A WEEK FOR 8 DOSES  ? pantoprazole (PROTONIX) 40 MG tablet Take 1 tablet (40 mg total) by mouth daily.  ? ?No facility-administered encounter medications on file as of 10/30/2021.  ? ? ?Patient Active Problem List  ? Diagnosis Date Noted  ? Osteoporosis 03/08/2021  ? Other long term (current) drug therapy 03/08/2021  ? 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 Pneumonia due to COVID-19 virus 05/21/2019  ? Hyperlipidemia associated with type 2 diabetes mellitus ([place]) 07/07/2018  ? Noncompliance with medication regimen 07/07/2018  ? Palpitations 05/07/2018  ? RA (rheumatoid arthritis) ([place]) 03/20/2017  ?  Mixed hyperlipidemia 02/13/2017  ? 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 Personal history of noncompliance with medical treatment, presenting hazards to health 12/24/2016  ? Bulging lumbar disc 08/22/2016  ? Syncope 02/10/2012  ? Ataxia 02/10/2012  ? Uncontrolled type 2 diabetes mellitus with diabetic nephropathy, with long-term current  use of insulin 02/10/2012  ? ? ?Conditions to be addressed/monitored: monitor client completion of daily ADLs, as she is able ? ?Care Plan : LCSW Care Plan  ?Updates made by Katha Cabal, LCSW since 10/30/2021 12:00 AM  ?  ? ?Problem: Coping Skills (General Plan of Care)   ?  ? ?Goal: Coping Skills Enhanced'Manage ADLS daily as able. Manage mobility needs   ?Start Date: 09/04/2021  ?Expected End Date: 01/11/2022  ?This Visit's Progress: On track  ?Recent Progress: On track  ?Priority: Medium  ?Note:   ?Current barriers:   ?Patient in need of assistance with connecting to community resources for possible help in completing her daily ADLs and other daily tasks ?Patient is unable to independently navigate community resource options without care coordination support ?Some mobility issues ?Vision issues ?Challenges in managing Diabetes ?May benefit from in home support of CNA ? ?Clinical Goals:  ? ?LCSW to talk with client in next 30 days about client completion of ADLs and client management of mobility issues  ?Patient to call LCSW or RNCM as needed in next 30 days for CCM support ?Client to communicate with LCSW in next 30 days as needed to discuss in home care support resources for client ? ?Clinical Interventions:  ?Collaboration with [name], DO regarding development and update of comprehensive plan of care as evidenced by provider attestation and co-signature ?Discussed  client needs with  [name] ?Discussed client completion of ADLs. Discussed appetite of client  ?Reviewed walking challenges of client. Client uses a cane to help her walk. Client said she has difficulty navigating stairs. She has fear of falling. She asked about device to let her exercise by pedaling her feet while sitting.  LCSW encouraged  client to talk with PCP about such a device. Also LCSW suggested that [place] may be able to research information for client on such a device, if approved by PCP. ?LCSW also  collaborated today with RNCM regarding client mobility needs and client fear of falling.   ?Reviewed relaxation techniques of client (she likes doing puzzles; she likes talking with a neighbor) ?Reviewed family support. She has support from her daughter, from her son, and from her niece ?Reviewed mood of client. She said she does get sad occasionally. She is concerned now about her iron infusion treatments and concerned about CKD status ?Encouraged client to call RNCM as needed to discuss nursing needs of client. Marland Kitchen ?Reviewed socialization of client. She said she is staying at home more now. She goes out to medical appointments or to do essential activities.  ?Reviewed transport needs of client. Client gets family assistance for her transport needs ? ?Patient Strengths: ?Has support from her daughter ?Client attends scheduled medical appointments ?Takes medications as prescribed ? ?Patient Deficits: ?Occasional help needed with completing ADLs ?Some mobility issues ? ?Patient Goals:  ?Patient will attend scheduled medical appointments in next 30 days ?Patient will communicate with RNCM or LCSW as needed in nest 30 days for CCm support ?Patient will communicate regularly with her daughter in next 30 days to discuss the needs of client ?-  ?Follow Up  Plan: LCSW to call client on 12/11/21 at 1:00 AM to assess needs of client   ?  ?  ?[name].Breandan People MSW, LCSW ?Licensed Clinical Social Worker ?[place] [facility] ?[phone] ?

## 2021-10-31 ENCOUNTER — Encounter: Payer: Self-pay | Admitting: *Deleted

## 2021-10-31 ENCOUNTER — Ambulatory Visit: Payer: Medicare Other | Admitting: *Deleted

## 2021-10-31 DIAGNOSIS — Z9181 History of falling: Secondary | ICD-10-CM

## 2021-10-31 DIAGNOSIS — R7309 Other abnormal glucose: Secondary | ICD-10-CM

## 2021-10-31 DIAGNOSIS — E1122 Type 2 diabetes mellitus with diabetic chronic kidney disease: Secondary | ICD-10-CM

## 2021-10-31 DIAGNOSIS — E1169 Type 2 diabetes mellitus with other specified complication: Secondary | ICD-10-CM

## 2021-11-06 ENCOUNTER — Ambulatory Visit: Payer: Medicare Other | Admitting: *Deleted

## 2021-11-06 DIAGNOSIS — E1122 Type 2 diabetes mellitus with diabetic chronic kidney disease: Secondary | ICD-10-CM

## 2021-11-06 DIAGNOSIS — E1169 Type 2 diabetes mellitus with other specified complication: Secondary | ICD-10-CM

## 2021-11-06 DIAGNOSIS — Z9181 History of falling: Secondary | ICD-10-CM

## 2021-11-06 NOTE — Patient Instructions (Signed)
Visit Information  Thank you for taking time to visit with me today. Please don't hesitate to contact me if I can be of assistance to you before our next scheduled telephone appointment.  Following are the goals we discussed today:  Patient Goals/Self-Care Activities: Patient will self administer medications as prescribed Patient will call pharmacy for medication refills Patient will continue to perform ADL's independently Patient will call provider office for new concerns or questions Eat meals at regular intervals to avoid low blood sugar Check blood sugar daily and as needed Bring Libre meter to office visits for review Use fingersticks to check blood sugar when it registers low on the CGM or if you feel that it is low Bring log to PCP appointments Call PCP if blood sugar continues to run "Low" Check and record blood pressure 3 times per week and call PCP with any readings outside of recommended range Call RN Care Manager as needed [phone] Schedule nurse appointment for blood pressure recheck  Only use insulin if blood sugar is over 300 and carefully monitor blood sugar after use Review information sent on pedal exercise machines. Can be purchased at local medical supply stores or online   Our next appointment is by telephone on 11/06/21 at 11:00  Please call the care guide team at [phone] if you need to cancel or reschedule your appointment.   If you are experiencing a Mental Health or [place] or need someone to talk to, please call the [facility]: [phone] call [phone]   Patient verbalizes understanding of instructions and care plan provided today and agrees to view in [place]. Active MyChart status and patient understanding of how to access instructions and care plan via MyChart confirmed with patient.     Chong Sicilian, BSN, RN-BC Embedded Chronic Care Manager [facility] / [employer] [facility] Direct Dial:  [phone]

## 2021-11-06 NOTE — Chronic Care Management (AMB) (Signed)
Chronic Care Management   CCM RN Visit Note  11/06/2021 Name: Erica Crosby MRN: [id number] DOB: October 12, 1945  Subjective: Erica Crosby is a 76 y.o. year old female who is a primary care patient of [name], DO. The care management team was consulted for assistance with disease management and care coordination needs.    Engaged with patient by telephone for follow up visit in response to provider referral for case management and/or care coordination services.   Consent to Services:  The patient was given information about Chronic Care Management services, agreed to services, and gave verbal consent prior to initiation of services.  Please see initial visit note for detailed documentation.   Patient agreed to services and verbal consent obtained.   Assessment: Review of patient past medical history, allergies, medications, health status, including review of consultants reports, laboratory and other test data, was performed as part of comprehensive evaluation and provision of chronic care management services.   SDOH (Social Determinants of Health) assessments and interventions performed:    CCM Care Plan  No Known Allergies  Outpatient Encounter Medications as of 11/06/2021  Medication Sig   Accu-Chek Softclix Lancets lancets CHECK BLOOD SUGAR TWICE A DAY AND AS NEEDED Dx E11.21   Adalimumab (HUMIRA PEN) 40 MG/0.4ML PNKT Inject 40 mg into the skin every 14 (fourteen) days. Can restart 06/04/19   aspirin EC 81 MG tablet Take 81 mg by mouth daily.   atorvastatin (LIPITOR) 10 MG tablet Take 1 tablet (10 mg total) by mouth daily.   Blood Glucose Monitoring Suppl (ACCU-CHEK AVIVA PLUS) w/Device KIT Test BS BID and prn Dx E11.21   enalapril (VASOTEC) 20 MG tablet 1 tablet   fluticasone (FLONASE) 50 MCG/ACT nasal spray Place 2 sprays into both nostrils daily.   folic acid (FOLVITE) 1 MG tablet Take 1 tablet (1 mg total) by mouth daily.   gabapentin (NEURONTIN) 400 MG capsule Take 1  capsule (400 mg total) by mouth at bedtime.   glucose blood (ACCU-CHEK AVIVA PLUS) test strip CHECK BLOOD SUGAR 2 TIMES A DAY AND AS NEEDED Dx E11.21   insulin glargine (LANTUS SOLOSTAR) 100 UNIT/ML Solostar Pen Inject 5-10 Units into the skin at bedtime.   leflunomide (ARAVA) 20 MG tablet Take 1 tablet (20 mg total) by mouth daily. Can restart 06/04/19   Omega-3 Fatty Acids (FISH OIL) 1000 MG CAPS    OPTIMAL-D 1.25 MG (50000 UT) capsule TAKE 1 CAPSULE BY MOUTH ONCE A WEEK FOR 8 DOSES   pantoprazole (PROTONIX) 40 MG tablet Take 1 tablet (40 mg total) by mouth daily.   No facility-administered encounter medications on file as of 11/06/2021.    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 ([place]) 12/24/2016   Personal history of noncompliance with medical treatment, presenting hazards to health 12/24/2016   Bulging lumbar disc 08/22/2016   Syncope 02/10/2012   Ataxia 02/10/2012   Uncontrolled type 2 diabetes mellitus with diabetic nephropathy, with long-term current use of insulin 02/10/2012    Conditions to be addressed/monitored:DMII and CKD  Stage 4  Care Plan : [facility] : [facility] Care Plan  Updates made by Ilean China, RN since 11/06/2021 12:00 AM     Problem: Chronic Disease Management Needs   Priority: Medium     Long-Range Goal: Work with [facility] Regarding Care Management and Care Coordination Associated with diabetes and CKD   This Visit's Progress: On track  Recent Progress: On track  Priority: High   Note:   Current Barriers:  Chronic Disease Management support and education needs related to DMII and CKD Stage 3 Transportation barriers  RNCM Clinical Goal(s):  Patient will take all medications exactly as prescribed and will call provider for medication related questions as evidenced by patient verbalization    continue to work with Consulting civil engineer and/or Social Worker to address care management and care coordination needs related to DMII and CKD Stage 3 as evidenced by adherence to CM Team Scheduled appointments     through collaboration with Consulting civil engineer, provider, and care team.   Interventions: 1:1 collaboration with primary care provider regarding development and update of comprehensive plan of care as evidenced by provider attestation and co-signature Inter-disciplinary care team collaboration (see longitudinal plan of care) Evaluation of current treatment plan related to  self management and patient's adherence to plan as established by provider   Chronic Kidney Disease (Status: Condition stable. Not addressed this visit.)  Last practice recorded BP readings:  Lab Results  Component Value Date   CREATININE 2.73 (H) 05/05/2021   BUN 57 (H) 05/05/2021   NA 139 05/05/2021   K 4.6 05/05/2021   CL 112 (H) 05/05/2021   CO2 21 (L) 05/05/2021  Evaluation of current treatment plan related to chronic kidney disease self management and patient's adherence to plan as established by provider      Reviewed medications with patient and discussed importance of compliance    Advised patient, providing education and rationale, to monitor blood pressure daily and record, calling PCP for findings outside established parameters    Discussed complications of poorly controlled blood pressure such as heart disease, stroke, circulatory complications, vision complications, kidney impairment, sexual dysfunction    Reviewed scheduled/upcoming provider appointments including    Discussed plans with  patient for ongoing care management follow up and provided patient with direct contact information for care management team    Assessed social determinant of health barriers      Diabetes:  (Status: Goal on Track (progressing): YES.) Lab Results  Component Value Date   HGBA1C 6.9 (H) 09/14/2021   HGBA1C 6.0 10/14/2020   HGBA1C 5.9 06/15/2020   Lab Results  Component Value Date   LDLCALC 77 10/14/2020   CREATININE 2.73 (H) 05/05/2021  Counseled on importance of regular laboratory monitoring as prescribed;        Discussed plans with patient for ongoing care management follow up and provided patient with direct contact information for care management team;      Advised patient, providing education and rationale, to check cbg when you have symptoms of low or high blood sugar and continuously with CGM  and record        call provider for findings outside established parameters;       Review of patient status, including review of consultants reports, relevant laboratory and other test results, and medications completed;       reviewed diet and appetite. Encouraged to eat meals at regular intervals to avoid low blood sugar and to avoid sugar and simple carbs Encouraged to reach out  to PCP with any blood sugar readings outside of recommended range and to monitor for symptoms of hypoglycemia  Reviewed upcoming appointments Discussed home blood sugar testing. Using meter and checking with fingersticks each morning and as needed if she feels her sugar is too low or too high Discussed frequent episodes of hypoglycemia. Patient keeps peanut butter crackers by her bed in case her blood sugar is too low. Discussed concern regarding hypoglycemic episodes. Patient has only taken insulin twice in the past week for blood sugars > 300  Discussed family/social support. Family checks in daily. Son will bring food at times but it's not always something she likes. Assessed for SDOH barriers Discussed activity  level and desire to be more active. Patient is interested in a pedal exercise machine that she can sit down to use.  Previously prepared information on pedal exercise machines and collaborated with [employer] office staff to print and mail to patient. Patient hasn't received materials yet but daughter plans to check on the pedal exerciser.  Advised that the pedal exerciser can be purchased at local medical supply stores or online. Online may be cheaper if she has someone that can assist with this.    Hypertension: (Status: Goal on Track (progressing): YES.) Last practice recorded BP readings:  BP Readings from Last 3 Encounters:  09/29/21 (!) 162/82  09/21/21 (!) 167/76  09/14/21 (!) 166/85   Evaluation of current treatment plan related to hypertension self management and patient's adherence to plan as established by provider;   Reviewed medications with patient and discussed importance of compliance;  Advised patient, providing education and rationale, to monitor blood pressure daily and record, calling PCP for findings outside established parameters;  Discussed complications of poorly controlled blood pressure such as heart disease, stroke, circulatory complications, vision complications, kidney impairment, sexual dysfunction;  Advised she needs to return to the office for a nurse visit to rck her blood pressure as it's been elevated the past few visits   Falls:  (Status: Goal on Track (progressing): YES.) Long Term Goal  Advised patient of importance of notifying provider of falls Assessed for falls since last encounter Assessed patients knowledge of fall risk prevention secondary to previously provided education Assessed working status of life alert bracelet and patient adherence Discussed fall precautions and use of rolling walker. No falls since last encounter.  Discussed medical alert system. Previously provided information on [employer] through [facility]. Patient/daughter has not  looked into this further. Encouraged them to do so. It would be helpful for her to have in an emergency.    Patient Goals/Self-Care Activities: Patient will self administer medications as prescribed Patient will call pharmacy for medication refills Patient will continue to perform ADL's independently Patient will call provider office for new concerns or questions Eat meals at regular intervals to avoid low blood sugar Check blood sugar daily and as needed Bring blood sugar meter to all appointments Bring log to PCP appointments Call PCP if blood sugar continues to run "Low" Check and record blood pressure 3 times per week and call PCP with any readings outside of recommended range Call RN Care Manager as needed [phone] Schedule nurse appointment for blood pressure recheck  Only use insulin if blood sugar is over 300 and carefully monitor blood sugar after use Review information sent on pedal exercise machines. Can be purchased at local medical supply stores or online Sign up for [employer] through [facility] [phone])  Plan:Telephone follow up appointment with care management team member scheduled  for:  11/28/21 with RNCM The patient has been provided with contact information for the care management team and has been advised to call with any health related questions or concerns.   Chong Sicilian, BSN, RN-BC Embedded Chronic Care Manager [facility] / [place] [facility] Direct Dial: [phone]

## 2021-11-06 NOTE — Patient Instructions (Signed)
Visit Information  Thank you for taking time to visit with me today. Please don't hesitate to contact me if I can be of assistance to you before our next scheduled telephone appointment.  Following are the goals we discussed today:  Patient will self administer medications as prescribed Patient will call pharmacy for medication refills Patient will continue to perform ADL's independently Patient will call provider office for new concerns or questions Eat meals at regular intervals to avoid low blood sugar Check blood sugar daily and as needed Bring blood sugar meter to all appointments Bring log to PCP appointments Call PCP if blood sugar continues to run "Low" Check and record blood pressure 3 times per week and call PCP with any readings outside of recommended range Call RN Care Manager as needed [phone] Schedule nurse appointment for blood pressure recheck  Only use insulin if blood sugar is over 300 and carefully monitor blood sugar after use Review information sent on pedal exercise machines. Can be purchased at local medical supply stores or online Sign up for [employer] through [facility] [phone])  As a [employer] Medicare Advantage member, you may be eligible to receive the Lifeline medical alert service. Lifeline is an easy-to-use medical alert system that lets you summon help any time of day or night - even if you can't speak. All you need to do is press your medical alert button, worn on a wristband or pendant, and a Trained Care Specialist will make sure you get the help you need as fast as possible. Lifeline not only provides you with the #1 medical alert system but we also offer you peace of mind.   Our next appointment is by telephone on 11/28/21 at 1:15pm  Please call the care guide team at [phone] if you need to cancel or reschedule your appointment.   If you are experiencing a Mental Health or [place] or need someone to talk to,  please call the [facility]: [phone] call 911   Patient verbalizes understanding of instructions and care plan provided today and agrees to view in [place]. Active MyChart status and patient understanding of how to access instructions and care plan via MyChart confirmed with patient.     Chong Sicilian, BSN, RN-BC Embedded Chronic Care Manager [facility] / [place] [facility] Direct Dial: [phone]

## 2021-11-06 NOTE — Chronic Care Management (AMB) (Signed)
Chronic Care Management   CCM RN Visit Note  10/31/2021 Name: Erica Crosby MRN: [id number] DOB: 16-Jul-1945  Subjective: Erica Crosby is a 76 y.o. year old female who is a primary care patient of [name], DO. The care management team was consulted for assistance with disease management and care coordination needs.    Engaged with patient by telephone for follow up visit in response to provider referral for case management and/or care coordination services.   Consent to Services:  The patient was given information about Chronic Care Management services, agreed to services, and gave verbal consent prior to initiation of services.  Please see initial visit note for detailed documentation.   Patient agreed to services and verbal consent obtained.   Assessment: Review of patient past medical history, allergies, medications, health status, including review of consultants reports, laboratory and other test data, was performed as part of comprehensive evaluation and provision of chronic care management services.   SDOH (Social Determinants of Health) assessments and interventions performed:    CCM Care Plan  No Known Allergies  Outpatient Encounter Medications as of 10/31/2021  Medication Sig   Accu-Chek Softclix Lancets lancets CHECK BLOOD SUGAR TWICE A DAY AND AS NEEDED Dx E11.21   Adalimumab (HUMIRA PEN) 40 MG/0.4ML PNKT Inject 40 mg into the skin every 14 (fourteen) days. Can restart 06/04/19   aspirin EC 81 MG tablet Take 81 mg by mouth daily.   atorvastatin (LIPITOR) 10 MG tablet Take 1 tablet (10 mg total) by mouth daily.   Blood Glucose Monitoring Suppl (ACCU-CHEK AVIVA PLUS) w/Device KIT Test BS BID and prn Dx E11.21   enalapril (VASOTEC) 20 MG tablet 1 tablet   fluticasone (FLONASE) 50 MCG/ACT nasal spray Place 2 sprays into both nostrils daily.   folic acid (FOLVITE) 1 MG tablet Take 1 tablet (1 mg total) by mouth daily.   gabapentin (NEURONTIN) 400 MG capsule Take 1  capsule (400 mg total) by mouth at bedtime.   glucose blood (ACCU-CHEK AVIVA PLUS) test strip CHECK BLOOD SUGAR 2 TIMES A DAY AND AS NEEDED Dx E11.21   insulin glargine (LANTUS SOLOSTAR) 100 UNIT/ML Solostar Pen Inject 5-10 Units into the skin at bedtime.   leflunomide (ARAVA) 20 MG tablet Take 1 tablet (20 mg total) by mouth daily. Can restart 06/04/19   Omega-3 Fatty Acids (FISH OIL) 1000 MG CAPS    OPTIMAL-D 1.25 MG (50000 UT) capsule TAKE 1 CAPSULE BY MOUTH ONCE A WEEK FOR 8 DOSES   pantoprazole (PROTONIX) 40 MG tablet Take 1 tablet (40 mg total) by mouth daily.   No facility-administered encounter medications on file as of 10/31/2021.    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   Pneumonia due to COVID-19 virus 05/21/2019   Hyperlipidemia associated with type 2 diabetes mellitus ([place]) 07/07/2018   Noncompliance with medication regimen 07/07/2018   Palpitations 05/07/2018   RA (rheumatoid arthritis) ([place]) 03/20/2017   Mixed hyperlipidemia 02/13/2017   Hypertension associated with stage 3 chronic kidney disease due to type 2 diabetes mellitus ([place]) 12/24/2016   Personal history of noncompliance with medical treatment, presenting hazards to health 12/24/2016   Bulging lumbar disc 08/22/2016   Syncope 02/10/2012   Ataxia 02/10/2012   Uncontrolled type 2 diabetes mellitus with diabetic nephropathy, with long-term current use of insulin 02/10/2012    Conditions to be addressed/monitored:HTN and DMII  Care Plan : [facility] Care Plan     Problem: Chronic Disease Management Needs   Priority: Medium     Long-Range Goal: Work with [facility] Regarding Care Management and Care Coordination Associated with diabetes and CKD   This Visit's Progress: On track  Recent Progress: On track  Priority: High  Note:   Current Barriers:  Chronic Disease Management support and  education needs related to DMII and CKD Stage 3 Transportation barriers  RNCM Clinical Goal(s):  Patient will take all medications exactly as prescribed and will call provider for medication related questions continue to work with RN Care Manager to address care management and care coordination needs related to DMII and CKD Stage 3  work with pharmacist to address medication management related to DMII through collaboration with RN Care manager, provider, and care team.   Interventions: 1:1 collaboration with primary care provider regarding development and update of comprehensive plan of care as evidenced by provider attestation and co-signature Inter-disciplinary care team collaboration (see longitudinal plan of care) Evaluation of current treatment plan related to  self management and patient's adherence to plan as established by provider   Chronic Kidney Disease (Status: Condition stable. Not addressed this visit.)  Last practice recorded BP readings:  Lab Results  Component Value Date   CREATININE 2.73 (H) 05/05/2021   BUN 57 (H) 05/05/2021   NA 139 05/05/2021   K 4.6 05/05/2021   CL 112 (H) 05/05/2021   CO2 21 (L) 05/05/2021  Evaluation of current treatment plan related to chronic kidney disease self management and patient's adherence to plan as established by provider      Reviewed medications with patient and discussed importance of compliance    Advised patient, providing education and rationale, to monitor blood pressure daily and record, calling PCP for findings outside established parameters    Discussed complications of poorly controlled blood pressure such as heart disease, stroke, circulatory complications, vision complications, kidney impairment, sexual dysfunction    Reviewed scheduled/upcoming provider appointments including    Discussed plans with patient for ongoing care management follow up and provided patient with direct contact information for care management team     Assessed social determinant of health barriers      Diabetes:  (Status: Goal on Track (progressing): YES.) Lab Results  Component Value Date   HGBA1C 6.9 (H) 09/14/2021   HGBA1C 6.0 10/14/2020   HGBA1C 5.9 06/15/2020   Lab Results  Component Value Date   LDLCALC 77 10/14/2020   CREATININE 2.73 (H) 05/05/2021    Counseled on importance of regular laboratory monitoring as prescribed;        Discussed plans with patient for ongoing care management follow up and provided patient with direct contact information for care management team;      Advised patient, providing education and rationale, to check cbg when you have symptoms of low or high blood sugar and continuously with CGM  and record        call provider for findings outside established parameters;       Review of patient status, including review of consultants reports, relevant laboratory and other test results, and medications completed;       reviewed diet and appetite. Encouraged to eat meals at regular intervals to avoid low blood sugar and to avoid sugar and simple carbs Encouraged to reach out to PCP with any blood sugar readings outside of recommended range and to monitor for symptoms of hypoglycemia  Reviewed upcoming appointments Advised to bring [place]  monitor to visits so that the data can be downloaded and reviewed for hypoglycemic patterns Discussed frequent episodes of hypoglycemia and PCPs recommendation to not use insulin unless blood sugar is over 300 and that she prefers she doesn't use insulin at all due to hypoglycemia and falls Discussed family/social support Assessed for SDOH barriers Discussed activity level and desire to be more active. Patient is interested in a pedal exercise machine that she can sit down to use.  Prepared information on pedal exercise machines and collaborated with [employer] office staff to print and mail to patient.  Advised that the pedal exerciser can be purchased at local medical  supply stores or online. Online may be cheaper if she has someone that can assist with this.    Hypertension: (Status: Goal on Track (progressing): YES.) Last practice recorded BP readings:  BP Readings from Last 3 Encounters:  09/29/21 (!) 162/82  09/21/21 (!) 167/76  09/14/21 (!) 166/85   Evaluation of current treatment plan related to hypertension self management and patient's adherence to plan as established by provider;   Reviewed medications with patient and discussed importance of compliance;  Advised patient, providing education and rationale, to monitor blood pressure daily and record, calling PCP for findings outside established parameters;  Discussed complications of poorly controlled blood pressure such as heart disease, stroke, circulatory complications, vision complications, kidney impairment, sexual dysfunction;  Advised she needs to return to the office for a nurse visit to rck her blood pressure as it's been elevated the past few visits   Falls:  (Status: Goal on track: NO.) Long Term Goal  Advised patient of importance of notifying provider of falls Assessed for falls since last encounter Assessed patients knowledge of fall risk prevention secondary to previously provided education Assessed working status of life alert bracelet and patient adherence Reminded that PCP doesn't want her to use insulin unless her blood sugar is over 300 and she prefers that she not use it at all because of falls related to hypoglycemia      Patient Goals/Self-Care Activities: Patient will self administer medications as prescribed Patient will call pharmacy for medication refills Patient will continue to perform ADL's independently Patient will call provider office for new concerns or questions Eat meals at regular intervals to avoid low blood sugar Check blood sugar daily and as needed Bring Lib[place] meter to office visits for review Use fingersticks to check blood sugar when it registers  low on the CGM or if you feel that it is low Bring log to PCP appointments Call PCP if blood sugar continues to run "Low" Check and record blood pressure 3 times per week and call PCP with any readings outside of recommended range Call RN Care Manager as needed [phone] Schedule nurse appointment for blood pressure recheck  Only use insulin if blood sugar is over 300 and carefully monitor blood sugar after use Review information sent on pedal exercise machines. Can be purchased at local medical supply stores or online  Plan:Telephone follow up appointment with care management team member scheduled for:  11/06/21 with RNCM The patient has been provided with contact information for the care management team and has been advised to call with any health related questions or concerns.   Chong Sicilian, BSN, RN-BC Embedded Chronic Care Manager [facility] / [place] Management Direct Dial: [phone]

## 2021-11-15 DIAGNOSIS — Z87891 Personal history of nicotine dependence: Secondary | ICD-10-CM

## 2021-11-15 DIAGNOSIS — I129 Hypertensive chronic kidney disease with stage 1 through stage 4 chronic kidney disease, or unspecified chronic kidney disease: Secondary | ICD-10-CM

## 2021-11-15 DIAGNOSIS — E1169 Type 2 diabetes mellitus with other specified complication: Secondary | ICD-10-CM

## 2021-11-15 DIAGNOSIS — N184 Chronic kidney disease, stage 4 (severe): Secondary | ICD-10-CM

## 2021-11-15 DIAGNOSIS — E1122 Type 2 diabetes mellitus with diabetic chronic kidney disease: Secondary | ICD-10-CM | POA: Diagnosis not present

## 2021-11-15 DIAGNOSIS — Z794 Long term (current) use of insulin: Secondary | ICD-10-CM | POA: Diagnosis not present

## 2021-11-15 DIAGNOSIS — N183 Chronic kidney disease, stage 3 unspecified: Secondary | ICD-10-CM

## 2021-11-15 DIAGNOSIS — M069 Rheumatoid arthritis, unspecified: Secondary | ICD-10-CM

## 2021-11-15 DIAGNOSIS — E782 Mixed hyperlipidemia: Secondary | ICD-10-CM

## 2021-11-22 ENCOUNTER — Telehealth: Payer: Self-pay | Admitting: Pharmacist

## 2021-11-22 DIAGNOSIS — E1169 Type 2 diabetes mellitus with other specified complication: Secondary | ICD-10-CM

## 2021-11-22 MED ORDER — FREESTYLE LIBRE 2 SENSOR MISC: Status: DC

## 2021-11-22 NOTE — Telephone Encounter (Signed)
LIBRE 2 CGM-ADV DM SUPPLY CLINIC NOTES FAXED TO [facility]

## 2021-11-22 NOTE — Patient Instructions (Signed)
Visit Information  Thank you for taking time to visit with me today. Please don't hesitate to contact me if I can be of assistance to you before our next scheduled telephone appointment.  Following are the goals we discussed today:  Patient will self administer medications as prescribed Patient will call pharmacy for medication refills Patient will continue to perform ADL's independently Patient will call provider office for new concerns or questions Eat meals at regular intervals to avoid low blood sugar Check blood sugar daily and as needed Bring blood sugar meter to all appointments Bring log to PCP appointments Call PCP if blood sugar continues to run "Low" Check and record blood pressure 3 times per week and call PCP with any readings outside of recommended range Call RN Care Manager as needed [phone]  Our next appointment is by telephone on 10/31/21  Please call the care guide team at [phone] if you need to cancel or reschedule your appointment.   If you are experiencing a Mental Health or [place] or need someone to talk to, please call the [facility]: [phone] call [phone]   Patient verbalizes understanding of instructions and care plan provided today and agrees to view in [place]. Active MyChart status and patient understanding of how to access instructions and care plan via MyChart confirmed with patient.     Chong Sicilian, BSN, RN-BC Embedded Chronic Care Manager [facility] / [place] Management Direct Dial: [phone]

## 2021-11-22 NOTE — Chronic Care Management (AMB) (Signed)
Chronic Care Management   CCM RN Visit Note  09/18/2021 Name: Erica Crosby MRN: [id number] DOB: 05/10/1946  Subjective: Erica Crosby is a 76 y.o. year old female who is a primary care patient of [name], DO. The care management team was consulted for assistance with disease management and care coordination needs.    Engaged with patient by telephone for follow up visit in response to provider referral for case management and/or care coordination services.   Consent to Services:  The patient was given information about Chronic Care Management services, agreed to services, and gave verbal consent prior to initiation of services.  Please see initial visit note for detailed documentation.   Patient agreed to services and verbal consent obtained.   Assessment: Review of patient past medical history, allergies, medications, health status, including review of consultants reports, laboratory and other test data, was performed as part of comprehensive evaluation and provision of chronic care management services.   SDOH (Social Determinants of Health) assessments and interventions performed:    CCM Care Plan  No Known Allergies  Outpatient Encounter Medications as of 09/18/2021  Medication Sig   Accu-Chek Softclix Lancets lancets CHECK BLOOD SUGAR TWICE A DAY AND AS NEEDED Dx E11.21   Adalimumab (HUMIRA PEN) 40 MG/0.4ML PNKT Inject 40 mg into the skin every 14 (fourteen) days. Can restart 06/04/19   aspirin EC 81 MG tablet Take 81 mg by mouth daily.   atorvastatin (LIPITOR) 10 MG tablet Take 1 tablet (10 mg total) by mouth daily.   Blood Glucose Monitoring Suppl (ACCU-CHEK AVIVA PLUS) w/Device KIT Test BS BID and prn Dx E11.21   enalapril (VASOTEC) 20 MG tablet 1 tablet   fluticasone (FLONASE) 50 MCG/ACT nasal spray Place 2 sprays into both nostrils daily.   folic acid (FOLVITE) 1 MG tablet Take 1 tablet (1 mg total) by mouth daily.   gabapentin (NEURONTIN) 400 MG capsule Take 1  capsule (400 mg total) by mouth at bedtime.   glucose blood (ACCU-CHEK AVIVA PLUS) test strip CHECK BLOOD SUGAR 2 TIMES A DAY AND AS NEEDED Dx E11.21   insulin glargine (LANTUS SOLOSTAR) 100 UNIT/ML Solostar Pen Inject 5-10 Units into the skin at bedtime.   leflunomide (ARAVA) 20 MG tablet Take 1 tablet (20 mg total) by mouth daily. Can restart 06/04/19   Omega-3 Fatty Acids (FISH OIL) 1000 MG CAPS    OPTIMAL-D 1.25 MG (50000 UT) capsule TAKE 1 CAPSULE BY MOUTH ONCE A WEEK FOR 8 DOSES   pantoprazole (PROTONIX) 40 MG tablet Take 1 tablet (40 mg total) by mouth daily.   No facility-administered encounter medications on file as of 09/18/2021.    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   Pneumonia due to COVID-19 virus 05/21/2019   Hyperlipidemia associated with type 2 diabetes mellitus ([place]) 07/07/2018   Noncompliance with medication regimen 07/07/2018   Palpitations 05/07/2018   RA (rheumatoid arthritis) ([place]) 03/20/2017   Mixed hyperlipidemia 02/13/2017   Hypertension associated with stage 3 chronic kidney disease due to type 2 diabetes mellitus ([place]) 12/24/2016   Personal history of noncompliance with medical treatment, presenting hazards to health 12/24/2016   Bulging lumbar disc 08/22/2016   Syncope 02/10/2012   Ataxia 02/10/2012   Uncontrolled type 2 diabetes mellitus with diabetic nephropathy, with long-term current use of insulin 02/10/2012    Conditions to be addressed/monitored:HTN and DMII  Care Plan : [facility] Care Plan     Problem: Chronic Disease Management Needs   Priority: Medium     Long-Range Goal: Work with [facility] Regarding Care Management and Care Coordination Associated with diabetes and CKD   This Visit's Progress: On track  Recent Progress: On track  Priority: High  Note:   Current Barriers:  Chronic Disease Management support and  education needs related to DMII and CKD Stage 3 Transportation barriers  RNCM Clinical Goal(s):  Patient will take all medications exactly as prescribed and will call provider for medication related questions as evidenced by patient verbalization    continue to work with Consulting civil engineer and/or Social Worker to address care management and care coordination needs related to DMII and CKD Stage 3 as evidenced by adherence to CM Team Scheduled appointments     through collaboration with Consulting civil engineer, provider, and care team.   Interventions: 1:1 collaboration with primary care provider regarding development and update of comprehensive plan of care as evidenced by provider attestation and co-signature Inter-disciplinary care team collaboration (see longitudinal plan of care) Evaluation of current treatment plan related to  self management and patient's adherence to plan as established by provider Therapeutic listening utilized regarding health related anxiety and change in family/social support   Chronic Kidney Disease (Status: Condition stable. Not addressed this visit.)  Last practice recorded BP readings:  Lab Results  Component Value Date   CREATININE 2.73 (H) 05/05/2021   BUN 57 (H) 05/05/2021   NA 139 05/05/2021   K 4.6 05/05/2021   CL 112 (H) 05/05/2021   CO2 21 (L) 05/05/2021  Evaluation of current treatment plan related to chronic kidney disease self management and patient's adherence to plan as established by provider      Reviewed medications with patient and discussed importance of compliance    Advised patient, providing education and rationale, to monitor blood pressure daily and record, calling PCP for findings outside established parameters    Discussed complications of poorly controlled blood pressure such as heart disease, stroke, circulatory complications, vision complications, kidney impairment, sexual dysfunction    Reviewed scheduled/upcoming provider appointments  including    Discussed plans with patient for ongoing care management follow up and provided patient with direct contact information for care management team    Assessed social determinant of health barriers      Diabetes:  (Status: Goal on Track (progressing): YES.) Lab Results  Component Value Date   HGBA1C 6.9 (H) 09/14/2021   HGBA1C 6.0 10/14/2020   HGBA1C 5.9 06/15/2020   Lab Results  Component Value Date   LDLCALC 77 10/14/2020   CREATININE 2.73 (H) 05/05/2021  Counseled on importance of regular laboratory monitoring as prescribed;        Discussed plans with patient for ongoing care management follow up and provided patient with direct contact information for care management team;      Advised patient, providing education and rationale, to check cbg when you have symptoms of low or high blood sugar and continuously with CGM  and record        call provider for findings outside established parameters;       Review of patient status, including review of consultants reports, relevant laboratory and other test results, and medications completed;       reviewed diet and appetite. Encouraged to eat meals at regular intervals to avoid low blood sugar and to avoid sugar and simple carbs Encouraged to reach out to PCP with  any blood sugar readings outside of recommended range and to monitor for symptoms of hypoglycemia  Reviewed upcoming appointments Discussed home blood sugar testing. Using meter and checking with fingersticks each morning and as needed if she feels her sugar is too low or too high Reviewed upcoming appointments Encouraged to call provider if blood sugar is running too high or too low   Hypertension: (Status: Condition stable. Not addressed this visit.) Last practice recorded BP readings:  BP Readings from Last 3 Encounters:  09/29/21 (!) 162/82  09/21/21 (!) 167/76  09/14/21 (!) 166/85   Evaluation of current treatment plan related to hypertension self management  and patient's adherence to plan as established by provider;   Reviewed medications with patient and discussed importance of compliance;  Advised patient, providing education and rationale, to monitor blood pressure daily and record, calling PCP for findings outside established parameters;  Discussed complications of poorly controlled blood pressure such as heart disease, stroke, circulatory complications, vision complications, kidney impairment, sexual dysfunction;  Advised she needs to return to the office for a nurse visit to rck her blood pressure as it's been elevated the past few visits   Falls:  (Status: Condition stable. Not addressed this visit.) Long Term Goal  Advised patient of importance of notifying provider of falls Assessed for falls since last encounter Assessed patients knowledge of fall risk prevention secondary to previously provided education Assessed working status of life alert bracelet and patient adherence Discussed fall precautions and use of rolling walker. No falls since last encounter.  Discussed medical alert system. Previously provided information on [employer] through [facility]. Patient/daughter has not looked into this further. Encouraged them to do so. It would be helpful for her to have in an emergency.    Patient Goals/Self-Care Activities: Patient will self administer medications as prescribed Patient will call pharmacy for medication refills Patient will continue to perform ADL's independently Patient will call provider office for new concerns or questions Eat meals at regular intervals to avoid low blood sugar Check blood sugar daily and as needed Bring blood sugar meter to all appointments Bring log to PCP appointments Call PCP if blood sugar continues to run "Low" Check and record blood pressure 3 times per week and call PCP with any readings outside of recommended range Call RN Care Manager as needed [phone]  Plan:Telephone follow up  appointment with care management team member scheduled for:  10/31/21 with RNCM The patient has been provided with contact information for the care management team and has been advised to call with any health related questions or concerns.   Chong Sicilian, BSN, RN-BC Embedded Chronic Care Manager [facility] / [place] [facility] Direct Dial: [phone]

## 2021-11-28 ENCOUNTER — Ambulatory Visit (INDEPENDENT_AMBULATORY_CARE_PROVIDER_SITE_OTHER): Payer: Medicare Other | Admitting: *Deleted

## 2021-11-28 DIAGNOSIS — Z9181 History of falling: Secondary | ICD-10-CM

## 2021-11-28 DIAGNOSIS — E1122 Type 2 diabetes mellitus with diabetic chronic kidney disease: Secondary | ICD-10-CM

## 2021-11-28 DIAGNOSIS — E1169 Type 2 diabetes mellitus with other specified complication: Secondary | ICD-10-CM

## 2021-12-05 DIAGNOSIS — E1165 Type 2 diabetes mellitus with hyperglycemia: Secondary | ICD-10-CM | POA: Diagnosis not present

## 2021-12-11 ENCOUNTER — Ambulatory Visit: Payer: Medicare Other | Admitting: Licensed Clinical Social Worker

## 2021-12-11 DIAGNOSIS — E1122 Type 2 diabetes mellitus with diabetic chronic kidney disease: Secondary | ICD-10-CM

## 2021-12-11 DIAGNOSIS — R270 Ataxia, unspecified: Secondary | ICD-10-CM

## 2021-12-11 DIAGNOSIS — Z794 Long term (current) use of insulin: Secondary | ICD-10-CM

## 2021-12-11 DIAGNOSIS — M069 Rheumatoid arthritis, unspecified: Secondary | ICD-10-CM

## 2021-12-11 DIAGNOSIS — E782 Mixed hyperlipidemia: Secondary | ICD-10-CM

## 2021-12-14 ENCOUNTER — Ambulatory Visit: Payer: Medicare Other | Admitting: *Deleted

## 2021-12-14 ENCOUNTER — Encounter: Payer: Self-pay | Admitting: *Deleted

## 2021-12-14 DIAGNOSIS — R7309 Other abnormal glucose: Secondary | ICD-10-CM

## 2021-12-14 DIAGNOSIS — I129 Hypertensive chronic kidney disease with stage 1 through stage 4 chronic kidney disease, or unspecified chronic kidney disease: Secondary | ICD-10-CM

## 2021-12-14 DIAGNOSIS — E1169 Type 2 diabetes mellitus with other specified complication: Secondary | ICD-10-CM

## 2021-12-14 NOTE — Patient Instructions (Signed)
Visit Information  Thank you for taking time to visit with me today. Please don't hesitate to contact me if I can be of assistance to you before our next scheduled telephone appointment.  Following are the goals we discussed today:  Patient will self administer medications as prescribed Patient will call pharmacy for medication refills Patient will continue to perform ADL's independently Patient will call provider office for new concerns or questions Eat meals at regular intervals to avoid low blood sugar Check blood sugar daily and as needed Bring blood sugar meter to all appointments Bring log to PCP appointments Call PCP if blood sugar continues to run "Low" Check and record blood pressure 3 times per week and call PCP with any readings outside of recommended range Call RN Care Manager as needed [phone]  Our next appointment is by telephone on 01/12/22 at 12:45  Please call the care guide team at [phone] if you need to cancel or reschedule your appointment.   If you are experiencing a Mental Health or [place] or need someone to talk to, please call the [facility]: [phone] call [phone]   Patient verbalizes understanding of instructions and care plan provided today and agrees to view in [place]. Active MyChart status and patient understanding of how to access instructions and care plan via MyChart confirmed with patient.     Chong Sicilian, BSN, RN-BC Embedded Chronic Care Manager [facility] [place] [facility] Direct Dial: [phone]

## 2021-12-14 NOTE — Chronic Care Management (AMB) (Signed)
Chronic Care Management   CCM RN Visit Note  12/14/2021 Name: Erica Crosby MRN: [id number] DOB: 07-11-1945  Subjective: Erica Crosby is a 76 y.o. year old female who is a primary care patient of [name], DO. The care management team was consulted for assistance with disease management and care coordination needs.    Engaged with patient by telephone for follow up visit in response to provider referral for case management and/or care coordination services.   Consent to Services:  The patient was given information about Chronic Care Management services, agreed to services, and gave verbal consent prior to initiation of services.  Please see initial visit note for detailed documentation.   Patient agreed to services and verbal consent obtained.   Assessment: Review of patient past medical history, allergies, medications, health status, including review of consultants reports, laboratory and other test data, was performed as part of comprehensive evaluation and provision of chronic care management services.   SDOH (Social Determinants of Health) assessments and interventions performed:    CCM Care Plan  No Known Allergies  Outpatient Encounter Medications as of 12/14/2021  Medication Sig   Accu-Chek Softclix Lancets lancets CHECK BLOOD SUGAR TWICE A DAY AND AS NEEDED Dx E11.21   Adalimumab (HUMIRA PEN) 40 MG/0.4ML PNKT Inject 40 mg into the skin every 14 (fourteen) days. Can restart 06/04/19   aspirin EC 81 MG tablet Take 81 mg by mouth daily.   atorvastatin (LIPITOR) 10 MG tablet Take 1 tablet (10 mg total) by mouth daily.   Blood Glucose Monitoring Suppl (ACCU-CHEK AVIVA PLUS) w/Device KIT Test BS BID and prn Dx E11.21   Continuous Blood Gluc Sensor ([name] 2 SENSOR) MISC USE TO TEST BLOOD SUGAR CONTINUOUSLY. Dx: E11.65. ADVANCED DIABETES SUPPLY VIA [place]   enalapril (VASOTEC) 20 MG tablet 1 tablet   fluticasone (FLONASE) 50 MCG/ACT nasal spray Place 2 sprays  into both nostrils daily.   folic acid (FOLVITE) 1 MG tablet Take 1 tablet (1 mg total) by mouth daily.   gabapentin (NEURONTIN) 400 MG capsule Take 1 capsule (400 mg total) by mouth at bedtime.   glucose blood (ACCU-CHEK AVIVA PLUS) test strip CHECK BLOOD SUGAR 2 TIMES A DAY AND AS NEEDED Dx E11.21   insulin glargine (LANTUS SOLOSTAR) 100 UNIT/ML Solostar Pen Inject 5-10 Units into the skin at bedtime.   leflunomide (ARAVA) 20 MG tablet Take 1 tablet (20 mg total) by mouth daily. Can restart 06/04/19   Omega-3 Fatty Acids (FISH OIL) 1000 MG CAPS    OPTIMAL-D 1.25 MG (50000 UT) capsule TAKE 1 CAPSULE BY MOUTH ONCE A WEEK FOR 8 DOSES   pantoprazole (PROTONIX) 40 MG tablet Take 1 tablet (40 mg total) by mouth daily.   No facility-administered encounter medications on file as of 12/14/2021.    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   Pneumonia due to COVID-19 virus 05/21/2019   Hyperlipidemia associated with type 2 diabetes mellitus ([place]) 07/07/2018   Noncompliance with medication regimen 07/07/2018   Palpitations 05/07/2018   RA (rheumatoid arthritis) ([place]) 03/20/2017   Mixed hyperlipidemia 02/13/2017   Hypertension associated with stage 3 chronic kidney disease due to type 2 diabetes mellitus ([place]) 12/24/2016   Personal history of noncompliance with medical treatment, presenting hazards to health 12/24/2016   Bulging lumbar disc 08/22/2016   Syncope 02/10/2012   Ataxia 02/10/2012  Uncontrolled type 2 diabetes mellitus with diabetic nephropathy, with long-term current use of insulin 02/10/2012    Conditions to be addressed/monitored:HTN and DMII  Care Plan : [name]  Updates made by Ilean China, RN since 12/14/2021 12:00 AM     Problem: Chronic Disease Management Needs   Priority: Medium     Long-Range Goal: Work with [place] with diabetes and CKD   This Visit's Progress: On track  Recent Progress: On track  Priority: High  Note:   Current Barriers:  Chronic Disease Management support and education needs related to DMII and CKD Stage 3 Transportation barriers  RNCM Clinical Goal(s):  Patient will take all medications exactly as prescribed and will call provider for medication related questions as evidenced by patient verbalization    continue to work with Consulting civil engineer and/or Social Worker to address care management and care coordination needs related to DMII and CKD Stage 3 as evidenced by adherence to CM Team Scheduled appointments     through collaboration with Consulting civil engineer, provider, and care team.   Interventions: 1:1 collaboration with primary care provider regarding development and update of comprehensive plan of care as evidenced by provider attestation and co-signature Inter-disciplinary care team collaboration (see longitudinal plan of care) Evaluation of current treatment plan related to  self management and patient's adherence to plan as established by provider Therapeutic listening utilized regarding health related anxiety and change in family/social support Discussed transportation needs. Patient has arranged transportation through [facility] for appt with PCP tomorrow.   Chronic Kidney Disease (Status: Condition stable. Not addressed this visit.)  Last practice recorded BP readings:  Lab Results  Component Value Date   CREATININE 2.73 (H) 05/05/2021   BUN 57 (H) 05/05/2021   NA 139 05/05/2021   K 4.6 05/05/2021   CL 112 (H) 05/05/2021   CO2 21 (L) 05/05/2021  Evaluation of current treatment plan related to chronic kidney disease self management and patient's adherence to plan as established by provider      Reviewed medications with patient and discussed importance of compliance    Advised patient, providing education and rationale, to monitor  blood pressure daily and record, calling PCP for findings outside established parameters    Discussed complications of poorly controlled blood pressure such as heart disease, stroke, circulatory complications, vision complications, kidney impairment, sexual dysfunction    Reviewed scheduled/upcoming provider appointments including    Discussed plans with patient for ongoing care management follow up and provided patient with direct contact information for care management team    Assessed social determinant of health barriers      Diabetes:  (Status: Goal on Track (progressing): YES.) Lab Results  Component Value Date   HGBA1C 6.9 (H) 09/14/2021   HGBA1C 6.0 10/14/2020   HGBA1C 5.9 06/15/2020   Lab Results  Component Value Date   LDLCALC 77 10/14/2020   CREATININE 2.73 (H) 05/05/2021  Counseled on importance of regular laboratory monitoring as prescribed;        Discussed plans with patient for ongoing care management follow up and provided patient with direct contact information for care management team;      Advised patient, providing education and rationale, to check cbg when you have symptoms of low or high blood sugar and continuously with CGM  and record        call provider for findings outside established parameters;       Review of patient  status, including review of consultants reports, relevant laboratory and other test results, and medications completed;       reviewed diet and appetite. Encouraged to eat meals at regular intervals to avoid low blood sugar and to avoid sugar and simple carbs Encouraged to reach out to PCP with any blood sugar readings outside of recommended range and to monitor for symptoms of hypoglycemia  Reviewed upcoming appointments Discussed home blood sugar testing. Using meter and checking with fingersticks each morning and as needed if she feels her sugar is too low or too high Reviewed upcoming appointments: PCP 12/15/21. Patient has secured  transportation through [facility]. Freestyle Elenor Legato has been reordered.  Encouraged to call provider if blood sugar is running too high or too low   Hypertension: (Status: Goal on track: NO.) Last practice recorded BP readings:  BP Readings from Last 3 Encounters:  09/29/21 (!) 162/82  09/21/21 (!) 167/76  09/14/21 (!) 166/85   Evaluation of current treatment plan related to hypertension self management and patient's adherence to plan as established by provider;   Reviewed medications with patient and discussed importance of compliance;  Advised patient, providing education and rationale, to monitor blood pressure daily and record, calling PCP for findings outside established parameters;  Discussed complications of poorly controlled blood pressure such as heart disease, stroke, circulatory complications, vision complications, kidney impairment, sexual dysfunction;    Falls:  (Status: Condition stable. Not addressed this visit.) Long Term Goal  Advised patient of importance of notifying provider of falls Assessed for falls since last encounter Assessed patients knowledge of fall risk prevention secondary to previously provided education Assessed working status of life alert bracelet and patient adherence Discussed fall precautions and use of rolling walker. No falls since last encounter.  Discussed medical alert system. Previously provided information on [employer] through [facility]. Patient/daughter has not looked into this further. Encouraged them to do so. It would be helpful for her to have in an emergency.    Patient Goals/Self-Care Activities: Patient will self administer medications as prescribed Patient will call pharmacy for medication refills Patient will continue to perform ADL's independently Patient will call provider office for new concerns or questions Eat meals at regular intervals to avoid low blood sugar Check blood sugar daily and as needed Bring  blood sugar meter to all appointments Bring log to PCP appointments Call PCP if blood sugar continues to run "Low" Check and record blood pressure 3 times per week and call PCP with any readings outside of recommended range Call RN Care Manager as needed [phone]  Plan:Telephone follow up appointment with care management team member scheduled for:  01/11/22 at 12:45 with [facility] The patient has been provided with contact information for the care management team and has been advised to call with any health related questions or concerns.   Chong Sicilian, BSN, RN-BC Embedded Chronic Care Manager [facility] [place] [facility] Direct Dial: [phone]

## 2021-12-14 NOTE — Patient Instructions (Signed)
Our records indicate that you are due for your annual mammogram/breast imaging. While there is no way to prevent breast cancer, early detection provides the best opportunity for curing it. For women over the age of 40, the American Cancer Society recommends a yearly clinical breast exam and a yearly mammogram. These practices have saved thousands of lives. We need your help to ensure that you are receiving optimal medical care. Please call the imaging location that has done you previous mammograms. Please remember to list us as your primary care. This helps make sure we receive a report and can update your chart.  Below is the contact information for several local breast imaging centers. You may call the location that works best for you, and they will be happy to assistance in making you an appointment. You do not need an order for a regular screening mammogram. However, if you are having any problems or concerns with you breast area, please let your primary care provider know, and appropriate orders will be placed. Please let our office know if you have any questions or concerns. Or if you need information for another imaging center not on this list or outside of the area. We are commented to working with you on your health care journey.   The mobile unit/bus ([facility]) - they come twice a month to our location.  These appointments can be made through our office or by call The [facility] Center  [facility]  [address] [place], [place] [address] Phone [phone]  [facility]  [address]  [place], [place] [address] [phone]  [facility]  [address] [place], [place] [address] [phone]  [facility] [place]  [address] [place] [address] [phone]  [facility]  [address], [address] [place] [address] [phone]  [facility] [place]  [address] [place], [place] [address] [phone]  [facility] [address] [place], [place] [address] [phone]  [facility] [address]  [address] [place], [place] [address] [phone]  [facility] [address] [place] [address] [phone]     

## 2021-12-15 ENCOUNTER — Ambulatory Visit: Payer: Medicare Other | Admitting: Family Medicine

## 2021-12-15 ENCOUNTER — Encounter: Payer: Self-pay | Admitting: Family Medicine

## 2021-12-15 VITALS — BP 136/79 | HR 93 | Temp 98.0°F | Ht 67.0 in | Wt 132.4 lb

## 2021-12-15 DIAGNOSIS — E1169 Type 2 diabetes mellitus with other specified complication: Secondary | ICD-10-CM | POA: Diagnosis not present

## 2021-12-15 DIAGNOSIS — E1122 Type 2 diabetes mellitus with diabetic chronic kidney disease: Secondary | ICD-10-CM | POA: Diagnosis not present

## 2021-12-15 DIAGNOSIS — I120 Hypertensive chronic kidney disease with stage 5 chronic kidney disease or end stage renal disease: Secondary | ICD-10-CM | POA: Diagnosis not present

## 2021-12-15 DIAGNOSIS — Z794 Long term (current) use of insulin: Secondary | ICD-10-CM

## 2021-12-15 DIAGNOSIS — R002 Palpitations: Secondary | ICD-10-CM

## 2021-12-15 DIAGNOSIS — E785 Hyperlipidemia, unspecified: Secondary | ICD-10-CM

## 2021-12-15 DIAGNOSIS — N184 Chronic kidney disease, stage 4 (severe): Secondary | ICD-10-CM

## 2021-12-15 DIAGNOSIS — Z7409 Other reduced mobility: Secondary | ICD-10-CM | POA: Diagnosis not present

## 2021-12-15 DIAGNOSIS — Z789 Other specified health status: Secondary | ICD-10-CM

## 2021-12-15 DIAGNOSIS — M069 Rheumatoid arthritis, unspecified: Secondary | ICD-10-CM | POA: Diagnosis not present

## 2021-12-15 DIAGNOSIS — I129 Hypertensive chronic kidney disease with stage 1 through stage 4 chronic kidney disease, or unspecified chronic kidney disease: Secondary | ICD-10-CM | POA: Diagnosis not present

## 2021-12-15 LAB — BAYER DCA HB A1C WAIVED: HB A1C (BAYER DCA - WAIVED): 6.2 % — ABNORMAL HIGH (ref 4.8–5.6)

## 2021-12-15 NOTE — Chronic Care Management (AMB) (Signed)
Chronic Care Management   CCM RN Visit Note  11/28/2021 Name: Erica Crosby MRN: [id number] DOB: [birth year]  Subjective: Erica Crosby is a 76 y.o. year old female who is a primary care patient of [name], DO. The care management team was consulted for assistance with disease management and care coordination needs.    Engaged with patient by telephone for follow up visit in response to provider referral for case management and/or care coordination services.   Consent to Services:  The patient was given information about Chronic Care Management services, agreed to services, and gave verbal consent prior to initiation of services.  Please see initial visit note for detailed documentation.   Patient agreed to services and verbal consent obtained.   Assessment: Review of patient past medical history, allergies, medications, health status, including review of consultants reports, laboratory and other test data, was performed as part of comprehensive evaluation and provision of chronic care management services.   SDOH (Social Determinants of Health) assessments and interventions performed:    CCM Care Plan  No Known Allergies  Outpatient Encounter Medications as of 11/28/2021  Medication Sig   Accu-Chek Softclix Lancets lancets CHECK BLOOD SUGAR TWICE A DAY AND AS NEEDED Dx E11.21   Adalimumab (HUMIRA PEN) 40 MG/0.4ML PNKT Inject 40 mg into the skin every 14 (fourteen) days. Can restart 06/04/19   aspirin EC 81 MG tablet Take 81 mg by mouth daily.   atorvastatin (LIPITOR) 10 MG tablet Take 1 tablet (10 mg total) by mouth daily.   Blood Glucose Monitoring Suppl (ACCU-CHEK AVIVA PLUS) w/Device KIT Test BS BID and prn Dx E11.21   Continuous Blood Gluc Sensor ([name] 2 SENSOR) MISC USE TO TEST BLOOD SUGAR CONTINUOUSLY. Dx: E11.65. ADVANCED DIABETES SUPPLY VIA PARACHUTE   enalapril (VASOTEC) 20 MG tablet 1 tablet   fluticasone (FLONASE) 50 MCG/ACT nasal spray Place 2 sprays  into both nostrils daily.   folic acid (FOLVITE) 1 MG tablet Take 1 tablet (1 mg total) by mouth daily.   glucose blood (ACCU-CHEK AVIVA PLUS) test strip CHECK BLOOD SUGAR 2 TIMES A DAY AND AS NEEDED Dx E11.21   insulin glargine (LANTUS SOLOSTAR) 100 UNIT/ML Solostar Pen Inject 5-10 Units into the skin at bedtime.   Omega-3 Fatty Acids (FISH OIL) 1000 MG CAPS    OPTIMAL-D 1.25 MG (50000 UT) capsule TAKE 1 CAPSULE BY MOUTH ONCE A WEEK FOR 8 DOSES   pantoprazole (PROTONIX) 40 MG tablet Take 1 tablet (40 mg total) by mouth daily.   [DISCONTINUED] gabapentin (NEURONTIN) 400 MG capsule Take 1 capsule (400 mg total) by mouth at bedtime.   [DISCONTINUED] leflunomide (ARAVA) 20 MG tablet Take 1 tablet (20 mg total) by mouth daily. Can restart 06/04/19   No facility-administered encounter medications on file as of 11/28/2021.    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   Pneumonia due to COVID-19 virus 05/21/2019   Hyperlipidemia associated with type 2 diabetes mellitus ([place]) 07/07/2018   Noncompliance with medication regimen 07/07/2018   Palpitations 05/07/2018   RA (rheumatoid arthritis) ([place]) 03/20/2017   Mixed hyperlipidemia 02/13/2017   Hypertension associated with stage 3 chronic kidney disease due to type 2 diabetes mellitus ([place]) 12/24/2016   Personal history of noncompliance with medical treatment, presenting hazards to health 12/24/2016   Bulging lumbar disc 08/22/2016   Syncope 02/10/2012   Ataxia 02/10/2012  Uncontrolled type 2 diabetes mellitus with diabetic nephropathy, with long-term current use of insulin 02/10/2012    Conditions to be addressed/monitored:HTN and DMII  Care Plan : [facility] Care Plan     Problem: Chronic Disease Management Needs   Priority: Medium     Long-Range Goal: Work with [facility] Regarding Care Management and Care Coordination  Associated with diabetes and CKD   This Visit's Progress: On track  Recent Progress: On track  Priority: High  Note:   Current Barriers:  Chronic Disease Management support and education needs related to DMII and CKD Stage 3 Transportation barriers  RNCM Clinical Goal(s):  Patient will take all medications exactly as prescribed and will call provider for medication related questions as evidenced by patient verbalization    continue to work with Consulting civil engineer and/or Social Worker to address care management and care coordination needs related to DMII and CKD Stage 3 as evidenced by adherence to CM Team Scheduled appointments     through collaboration with Consulting civil engineer, provider, and care team.   Interventions: 1:1 collaboration with primary care provider regarding development and update of comprehensive plan of care as evidenced by provider attestation and co-signature Inter-disciplinary care team collaboration (see longitudinal plan of care) Evaluation of current treatment plan related to  self management and patient's adherence to plan as established by provider Therapeutic listening utilized regarding health related anxiety and change in family/social support  Chronic Kidney Disease (Status: Condition stable. Not addressed this visit.)  Last practice recorded BP readings:  Lab Results  Component Value Date   CREATININE 2.73 (H) 05/05/2021   BUN 57 (H) 05/05/2021   NA 139 05/05/2021   K 4.6 05/05/2021   CL 112 (H) 05/05/2021   CO2 21 (L) 05/05/2021  Evaluation of current treatment plan related to chronic kidney disease self management and patient's adherence to plan as established by provider      Reviewed medications with patient and discussed importance of compliance    Advised patient, providing education and rationale, to monitor blood pressure daily and record, calling PCP for findings outside established parameters    Discussed complications of poorly controlled blood  pressure such as heart disease, stroke, circulatory complications, vision complications, kidney impairment, sexual dysfunction    Reviewed scheduled/upcoming provider appointments including    Discussed plans with patient for ongoing care management follow up and provided patient with direct contact information for care management team    Assessed social determinant of health barriers      Diabetes:  (Status: Goal on Track (progressing): YES.) Lab Results  Component Value Date   HGBA1C 6.9 (H) 09/14/2021   HGBA1C 6.0 10/14/2020   HGBA1C 5.9 06/15/2020   Lab Results  Component Value Date   LDLCALC 77 10/14/2020   CREATININE 2.73 (H) 05/05/2021  Counseled on importance of regular laboratory monitoring as prescribed;        Discussed plans with patient for ongoing care management follow up and provided patient with direct contact information for care management team;      Advised patient, providing education and rationale, to check cbg when you have symptoms of low or high blood sugar and continuously with CGM  and record        call provider for findings outside established parameters;       Review of patient status, including review of consultants reports, relevant laboratory and other test results, and medications completed;       reviewed diet and appetite. Encouraged to  eat meals at regular intervals to avoid low blood sugar and to avoid sugar and simple carbs Encouraged to reach out to PCP with any blood sugar readings outside of recommended range and to monitor for symptoms of hypoglycemia  Reviewed upcoming appointments Discussed home blood sugar testing. Using meter and checking with fingersticks each morning and as needed if she feels her sugar is too low or too high Encouraged to call provider if blood sugar is running too high or too low   Hypertension: (Status: Goal on track: NO.) Last practice recorded BP readings:  BP Readings from Last 3 Encounters:  09/29/21 (!) 162/82   09/21/21 (!) 167/76  09/14/21 (!) 166/85   Evaluation of current treatment plan related to hypertension self management and patient's adherence to plan as established by provider;   Reviewed medications with patient and discussed importance of compliance;  Advised patient, providing education and rationale, to monitor blood pressure daily and record, calling PCP for findings outside established parameters;  Discussed complications of poorly controlled blood pressure such as heart disease, stroke, circulatory complications, vision complications, kidney impairment, sexual dysfunction;    Falls:  (Status: Goal on Track (progressing): YES.) Long Term Goal  Advised patient of importance of notifying provider of falls Assessed for falls since last encounter Assessed patients knowledge of fall risk prevention secondary to previously provided education Assessed working status of life alert bracelet and patient adherence Discussed fall precautions and use of rolling walker. No falls since last encounter.  Discussed medical alert system. Previously provided information on [employer] through [facility]. Patient/daughter has not looked into this further. Encouraged them to do so. It would be helpful for her to have in an emergency. Discussed exercise and strengthening activities. Patient still plans on buying a pedal bike that she can use while sitting to work her legs.     Patient Goals/Self-Care Activities: Patient will self administer medications as prescribed Patient will call pharmacy for medication refills Patient will continue to perform ADL's independently Patient will call provider office for new concerns or questions Eat meals at regular intervals to avoid low blood sugar Check blood sugar daily and as needed Bring blood sugar meter to all appointments Bring log to PCP appointments Call PCP if blood sugar continues to run "Low" Check and record blood pressure 3 times per week and call  PCP with any readings outside of recommended range Call [place] as needed [phone] Increase activity level as tolerated  Plan:Telephone follow up appointment with care management team member scheduled for:  12/14/21 with RNCM The patient has been provided with contact information for the care management team and has been advised to call with any health related questions or concerns.   Chong Sicilian, BSN, RN-BC Embedded Chronic Care Manager [facility] / [place] [facility] Direct Dial: [phone]

## 2021-12-15 NOTE — Progress Notes (Signed)
Subjective: CC:Dm PCP: [name], DO HPI:Erica Crosby is a 76 y.o. female presenting to clinic today for:  1. Type 2 Diabetes with hypertension, hyperlipidemia:  Patient reports compliance with her Lantus, Lipitor and Vasotec.  She has an appoint with podiatry in July.  She is already had her diabetic eye exam done.  She has had no hypoglycemic episodes.  Last eye exam: Up-to-date Last foot exam: Needs Last A1c:  Lab Results  Component Value Date   HGBA1C 6.2 (H) 12/15/2021   Nephropathy screen indicated?:  On ACE inhibitor Last flu, zoster and/or pneumovax:  Immunization History  Administered Date(s) Administered   Influenza Split 04/05/2010   Influenza, High Dose Seasonal PF 03/11/2018, 02/19/2019   Influenza,inj,Quad PF,6+ Mos 03/18/2016, 03/20/2017   Influenza-Unspecified 03/02/2021   Moderna Sars-Covid-2 Vaccination 07/24/2019, 08/21/2019, 05/11/2020   Pneumococcal Conjugate-13 04/21/2016   Pneumococcal Polysaccharide-23 04/05/2010, 08/14/2017, 01/03/2018   Tdap 11/05/2009   Zoster Recombinat (Shingrix) 01/03/2018, 03/11/2018    ROS: Denies chest pain, shortness of breath, visual disturbance she does report some heart palpitations and sometimes feels a little heavy on that left side when she lays on it.  She is previously had an evaluation for palpitations but that was a few years ago.  She would like to see the specialist again for further evaluation.    ROS: Per HPI  No Known Allergies Past Medical History:  Diagnosis Date   Anemia    Arthritis    Cataract    Coronary artery disease    Mild plaque 2012   Diabetes mellitus    Essential hypertension 02/10/2012   GERD (gastroesophageal reflux disease)    Hyperlipidemia    Hypertension    Neuropathy    Peptic ulcer    Rheumatoid arthritis ([place])    TIA (transient ischemic attack) 02/10/2012    Current Outpatient Medications:    Accu-Chek Softclix Lancets lancets, CHECK BLOOD SUGAR TWICE A DAY AND  AS NEEDED Dx E11.21, Disp: 200 each, Rfl: 3   Adalimumab (HUMIRA PEN) 40 MG/0.4ML PNKT, Inject 40 mg into the skin every 14 (fourteen) days. Can restart 06/04/19, Disp: 2 each, Rfl:    aspirin EC 81 MG tablet, Take 81 mg by mouth daily., Disp: , Rfl:    atorvastatin (LIPITOR) 10 MG tablet, Take 1 tablet (10 mg total) by mouth daily., Disp: 90 tablet, Rfl: 3   Blood Glucose Monitoring Suppl (ACCU-CHEK AVIVA PLUS) w/Device KIT, Test BS BID and prn Dx E11.21, Disp: 1 kit, Rfl: 0   Continuous Blood Gluc Sensor ([name] 2 SENSOR) MISC, USE TO TEST BLOOD SUGAR CONTINUOUSLY. Dx: E11.65. ADVANCED DIABETES SUPPLY VIA PARACHUTE, Disp: , Rfl:    enalapril (VASOTEC) 20 MG tablet, 1 tablet, Disp: , Rfl:    fluticasone (FLONASE) 50 MCG/ACT nasal spray, Place 2 sprays into both nostrils daily., Disp: 16 g, Rfl: 6   folic acid (FOLVITE) 1 MG tablet, Take 1 tablet (1 mg total) by mouth daily., Disp: 90 tablet, Rfl: 3   glucose blood (ACCU-CHEK AVIVA PLUS) test strip, CHECK BLOOD SUGAR 2 TIMES A DAY AND AS NEEDED Dx E11.21, Disp: 200 strip, Rfl: 3   insulin glargine (LANTUS SOLOSTAR) 100 UNIT/ML Solostar Pen, Inject 5-10 Units into the skin at bedtime., Disp: 15 mL, Rfl: 2   Omega-3 Fatty Acids (FISH OIL) 1000 MG CAPS, , Disp: , Rfl:    OPTIMAL-D 1.25 MG (50000 UT) capsule, TAKE 1 CAPSULE BY MOUTH ONCE A WEEK FOR 8 DOSES, Disp: 8 capsule, Rfl:  0   pantoprazole (PROTONIX) 40 MG tablet, Take 1 tablet (40 mg total) by mouth daily., Disp: 90 tablet, Rfl: 3 Social History   Socioeconomic History   Marital status: Widowed    Spouse name: Not on file   Number of children: 3   Years of education: 52   Highest education level: High school graduate  Occupational History   Occupation: retired  Tobacco Use   Smoking status: Former    Years: 25.00    Types: Cigarettes, E-cigarettes    Quit date: 02/02/2013    Years since quitting: 8.8   Smokeless tobacco: Never  Vaping Use   Vaping Use: Never used   Substance and Sexual Activity   Alcohol use: No   Drug use: No   Sexual activity: Not Currently    Birth control/protection: Post-menopausal  Other Topics Concern   Not on file  Social History Narrative   Daughter stays with her.    Son comes daily when daughter is working   Investment banker, operational of Radio broadcast assistant Strain: Medium Risk (03/08/2021)   Overall Financial Resource Strain (CARDIA)    Difficulty of Paying Living Expenses: Somewhat hard  Food Insecurity: Food Insecurity Present (03/08/2021)   Hunger Vital Sign    Worried About Running Out of Food in the Last Year: Sometimes true    Ran Out of Food in the Last Year: Never true  Transportation Needs: Unmet Transportation Needs (03/08/2021)   PRAPARE - Hydrologist (Medical): Yes    Lack of Transportation (Non-Medical): Yes  Physical Activity: Inactive (12/11/2021)   Exercise Vital Sign    Days of Exercise per Week: 0 days    Minutes of Exercise per Session: 0 min  Stress: Stress Concern Present (12/11/2021)   [place]    Feeling of Stress : To some extent  Social Connections: Socially Isolated (03/08/2021)   Social Connection and Isolation Panel [NHANES]    Frequency of Communication with Friends and Family: More than three times a week    Frequency of Social Gatherings with Friends and Family: Once a week    Attends Religious Services: Never    Marine scientist or Organizations: No    Attends Archivist Meetings: Never    Marital Status: Widowed  Intimate Partner Violence: Not At Risk (03/08/2021)   Humiliation, Afraid, Rape, and Kick questionnaire    Fear of Current or Ex-Partner: No    Emotionally Abused: No    Physically Abused: No    Sexually Abused: No   Family History  Problem Relation Age of Onset   Heart disease Sister        Congeital (died age 75) with "enlarged heart"   CAD Sister     Diabetes Mother    Heart disease Mother        Later onset heart disease    Dementia Mother    Alcohol abuse Father    Liver disease Father    CAD Sister 62       CABG   Early death Sister    Diabetes Brother    Kidney disease Brother        related to DM   Diabetes Brother     Objective: Office vital signs reviewed. BP 136/79   Pulse 93   Temp 98 F (36.7 C)   Ht _0  (1.702 m)   Wt 132 lb 6.4 oz (60.1 kg)  SpO2 99%   BMI 20.74 kg/m   Physical Examination:  General: Awake, alert, thin, elderly female in no acute distress HEENT: Sclera white.  Moist mucous membranes.  No exophthalmos.  No goiter Cardio: regular rate and rhythm, S1S2 heard, ?  Soft systolic murmur Pulm: clear to auscultation bilaterally, no wheezes, rhonchi or rales; normal work of breathing on room air Neuro: No tremor  Diabetic Foot Exam - Simple   Simple Foot Form Diabetic Foot exam was performed with the following findings: Yes 12/15/2021  4:09 PM  Visual Inspection See comments: Yes Sensation Testing Intact to touch and monofilament testing bilaterally: Yes Pulse Check Posterior Tibialis and Dorsalis pulse intact bilaterally: Yes Comments Monofilament sensation intact.  She has onychomycotic changes to the nails bilaterally      Assessment/ Plan: 76 y.o. 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  Could consider looser control of blood sugar given advanced  age  Blood pressure well controlled upon recheck.  Continue to follow-up nephrology for renal disease  Continue statin.  Not yet due for fasting lipid  Following up with rheumatology for ongoing Humira  I have placed referral back to Dr. Percival Spanish for the heart palpitations.  Her exam was unremarkable for arrhythmia or tachycardia today  Orders Placed This Encounter  Procedures   Bayer DCA Hb A1c Waived   No orders of the defined types were placed in this encounter.    [name], DO [place] [phone]

## 2021-12-15 NOTE — Patient Instructions (Signed)
Visit Information  Thank you for taking time to visit with me today. Please don't hesitate to contact me if I can be of assistance to you before our next scheduled telephone appointment.  Following are the goals we discussed today:  Patient will self administer medications as prescribed Patient will call pharmacy for medication refills Patient will continue to perform ADL's independently Patient will call provider office for new concerns or questions Eat meals at regular intervals to avoid low blood sugar Check blood sugar daily and as needed Bring blood sugar meter to all appointments Bring log to PCP appointments Call PCP if blood sugar continues to run "Low" Check and record blood pressure 3 times per week and call PCP with any readings outside of recommended range Call [place] as needed [phone] Increase activity level as tolerated  Our next appointment is by telephone on 12/14/2021  Please call the care guide team at [phone] if you need to cancel or reschedule your appointment.   If you are experiencing a Mental Health or [place] or need someone to talk to, please call the [facility]: [phone] call [phone]   Patient verbalizes understanding of instructions and care plan provided today and agrees to view in [place]. Active MyChart status and patient understanding of how to access instructions and care plan via MyChart confirmed with patient.     Chong Sicilian, BSN, RN-BC Embedded Chronic Care Manager [facility] / [place] [facility] Direct Dial: [phone]

## 2022-01-05 DIAGNOSIS — E1165 Type 2 diabetes mellitus with hyperglycemia: Secondary | ICD-10-CM | POA: Diagnosis not present

## 2022-01-11 ENCOUNTER — Ambulatory Visit: Payer: Medicare Other | Admitting: *Deleted

## 2022-01-11 DIAGNOSIS — E1169 Type 2 diabetes mellitus with other specified complication: Secondary | ICD-10-CM

## 2022-01-11 DIAGNOSIS — I129 Hypertensive chronic kidney disease with stage 1 through stage 4 chronic kidney disease, or unspecified chronic kidney disease: Secondary | ICD-10-CM

## 2022-01-11 NOTE — Patient Instructions (Signed)
Visit Information  Thank you for taking time to visit with me today. Please don't hesitate to contact me if I can be of assistance to you before your next scheduled telephone appointment.  Following are the goals we discussed today:  Patient will self administer medications as prescribed Patient will call pharmacy for medication refills Patient will continue to perform ADL's independently Patient will call provider office for new concerns or questions Eat meals at regular intervals to avoid low blood sugar Check blood sugar daily and as needed Bring blood sugar meter to all appointments Bring log to PCP appointments Call PCP if blood sugar continues to run "Low" Check and record blood pressure 3 times per week and call PCP with any readings outside of recommended range Call [place] as needed [phone] Increase activity level as tolerated  Your next appointment is by telephone on 01/31/22 at 11:30 with Jackelyn Poling, RN Care Coordinator  Please call the care guide team at [phone] if you need to cancel or reschedule your appointment.   If you are experiencing a Mental Health or [place] or need someone to talk to, please call the [facility]: [phone] call [phone]   Patient verbalizes understanding of instructions and care plan provided today and agrees to view in [place]. Active MyChart status and patient understanding of how to access instructions and care plan via MyChart confirmed with patient.     Chong Sicilian, BSN, RN-BC Embedded Chronic Care Manager [facility] / [place] [facility] Direct Dial: [phone]

## 2022-01-11 NOTE — Telephone Encounter (Signed)
  Care Management   Follow Up Note   10/16/2021 Name: Erica Crosby MRN: [id number] DOB: 27-Nov-1945   Referred by: [name], DO Reason for referral : Chronic Care Management (Unsuccessful telephone followup)   An unsuccessful telephone outreach was attempted today. The patient was referred to the case management team for assistance with care management and care coordination.   Follow Up Plan: Telephone follow up appointment with care management team member scheduled for: 12/14/21 with RNCM  Chong Sicilian, BSN, RN-BC [place] / [place] Management Direct Dial: [phone]

## 2022-01-11 NOTE — Chronic Care Management (AMB) (Signed)
Care Management    RN Visit Note  01/11/2022 Name: [name] MRN: [id number] DOB: May 18, 1946  Subjective: Erica Crosby is a 76 y.o. year old female who is a primary care patient of Erica Norlander, DO. The care management team was consulted for assistance with disease management and care coordination needs.    Engaged with patient by telephone for follow up visit in response to provider referral for case management and/or care coordination services.   Consent to Services:   Erica Crosby was given information about Care Management services today including:  Care Management services includes personalized support from designated clinical staff supervised by her physician, including individualized plan of care and coordination with other care providers 24/7 contact phone numbers for assistance for urgent and routine care needs. The patient may stop case management services at any time by phone call to the office staff.  Patient agreed to services and consent obtained.   Assessment: Review of patient past medical history, allergies, medications, health status, including review of consultants reports, laboratory and other test data, was performed as part of comprehensive evaluation and provision of chronic care management services.   SDOH (Social Determinants of Health) assessments and interventions performed:    Care Plan  No Known Allergies  Outpatient Encounter Medications as of 01/11/2022  Medication Sig   Accu-Chek Softclix Lancets lancets CHECK BLOOD SUGAR TWICE A DAY AND AS NEEDED Dx E11.21   Adalimumab (HUMIRA PEN) 40 MG/0.4ML PNKT Inject 40 mg into the skin every 14 (fourteen) days. Can restart 06/04/19   aspirin EC 81 MG tablet Take 81 mg by mouth daily.   atorvastatin (LIPITOR) 10 MG tablet Take 1 tablet (10 mg total) by mouth daily.   Blood Glucose Monitoring Suppl (ACCU-CHEK AVIVA PLUS) w/Device KIT Test BS BID and prn Dx E11.21   Continuous Blood Gluc Sensor (FREESTYLE  LIBRE 2 SENSOR) MISC USE TO TEST BLOOD SUGAR CONTINUOUSLY. Dx: E11.65. ADVANCED DIABETES SUPPLY VIA PARACHUTE   enalapril (VASOTEC) 20 MG tablet 1 tablet   fluticasone (FLONASE) 50 MCG/ACT nasal spray Place 2 sprays into both nostrils daily.   folic acid (FOLVITE) 1 MG tablet Take 1 tablet (1 mg total) by mouth daily.   glucose blood (ACCU-CHEK AVIVA PLUS) test strip CHECK BLOOD SUGAR 2 TIMES A DAY AND AS NEEDED Dx E11.21   insulin glargine (LANTUS SOLOSTAR) 100 UNIT/ML Solostar Pen Inject 5-10 Units into the skin at bedtime.   Omega-3 Fatty Acids (FISH OIL) 1000 MG CAPS    OPTIMAL-D 1.25 MG (50000 UT) capsule TAKE 1 CAPSULE BY MOUTH ONCE A WEEK FOR 8 DOSES   pantoprazole (PROTONIX) 40 MG tablet Take 1 tablet (40 mg total) by mouth daily.   No facility-administered encounter medications on file as of 01/11/2022.    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[place]) 03/08/2021   Pneumonia due to COVID-19 virus 05/21/2019   Hyperlipidemia associated with type 2 diabetes mellitus ([place]) 07/07/2018   Noncompliance with medication regimen 07/07/2018   Palpitations 05/07/2018   RA (rheumatoid arthritis) ([place]) 03/20/2017   Mixed hyperlipidemia 02/13/2017   Hypertension associated with stage 3 chronic kidney disease due to type 2 diabetes mellitus ([place]) 12/24/2016   Personal history of noncompliance with medical treatment, presenting hazards to health 12/24/2016   Bulging lumbar disc 08/22/2016   Syncope 02/10/2012   Ataxia 02/10/2012   Uncontrolled type 2  diabetes mellitus with diabetic nephropathy, with long-term current use of insulin 02/10/2012    Conditions to be addressed/monitored: HTN and DMII  Care Plan : [facility] Care Plan  Updates made by Ilean China, RN since 01/11/2022 12:00 AM  Completed 01/11/2022   Problem: Chronic Disease Management Needs Resolved 01/11/2022  Priority:  Medium     Long-Range Goal: Work with [place] with diabetes and CKD Completed 01/11/2022  Recent Progress: On track  Priority: High  Note:   Wellsite geologist care plan. Scheduling with Care Coordination Nurse for f/u  Current Barriers:  Chronic Disease Management support and education needs related to DMII and CKD Stage 3 Transportation barriers  RNCM Clinical Goal(s):  Patient will take all medications exactly as prescribed and will call provider for medication related questions as evidenced by patient verbalization    continue to work with Consulting civil engineer and/or Social Worker to address care management and care coordination needs related to DMII and CKD Stage 3 as evidenced by adherence to CM Team Scheduled appointments     through collaboration with Consulting civil engineer, provider, and care team.   Interventions: 1:1 collaboration with primary care provider regarding development and update of comprehensive plan of care as evidenced by provider attestation and co-signature Inter-disciplinary care team collaboration (see longitudinal plan of care) Evaluation of current treatment plan related to  self management and patient's adherence to plan as established by provider Therapeutic listening utilized regarding health related anxiety and change in family/social support  Chronic Kidney Disease (Status: Condition stable. Not addressed this visit.)  Last practice recorded BP readings:  Lab Results  Component Value Date   CREATININE 2.73 (H) 05/05/2021   BUN 57 (H) 05/05/2021   NA 139 05/05/2021   K 4.6 05/05/2021   CL 112 (H) 05/05/2021   CO2 21 (L) 05/05/2021  Evaluation of current treatment plan related to chronic kidney disease self management and patient's adherence to plan as established by provider      Reviewed medications with patient and discussed importance of compliance    Advised patient, providing education and  rationale, to monitor blood pressure daily and record, calling PCP for findings outside established parameters    Discussed complications of poorly controlled blood pressure such as heart disease, stroke, circulatory complications, vision complications, kidney impairment, sexual dysfunction    Reviewed scheduled/upcoming provider appointments including    Discussed plans with patient for ongoing care management follow up and provided patient with direct contact information for care management team    Assessed social determinant of health barriers      Diabetes:  (Status: Goal on Track (progressing): YES.) Lab Results  Component Value Date   HGBA1C 6.9 (H) 09/14/2021   HGBA1C 6.0 10/14/2020   HGBA1C 5.9 06/15/2020   Lab Results  Component Value Date   LDLCALC 77 10/14/2020   CREATININE 2.73 (H) 05/05/2021  Counseled on importance of regular laboratory monitoring as prescribed;        Discussed plans with patient for ongoing care management follow up and provided patient with direct contact information for care management team;      Advised patient, providing education and rationale, to check cbg when you have symptoms of low or high blood sugar and continuously with CGM  and record        call provider for findings outside established parameters;       Review of patient status, including review of consultants reports,  relevant laboratory and other test results, and medications completed;       reviewed diet and appetite. Encouraged to eat meals at regular intervals to avoid low blood sugar and to avoid sugar and simple carbs Encouraged to reach out to PCP with any blood sugar readings outside of recommended range and to monitor for symptoms of hypoglycemia  Reviewed upcoming appointments Discussed home blood sugar testing. Using meter and checking with fingersticks each morning and as needed if she feels her sugar is too low or too high Encouraged to call provider if blood sugar is running  too high or too low   Hypertension: (Status: Goal on track: NO.) Last practice recorded BP readings:  BP Readings from Last 3 Encounters:  09/29/21 (!) 162/82  09/21/21 (!) 167/76  09/14/21 (!) 166/85   Evaluation of current treatment plan related to hypertension self management and patient's adherence to plan as established by provider;   Reviewed medications with patient and discussed importance of compliance;  Advised patient, providing education and rationale, to monitor blood pressure daily and record, calling PCP for findings outside established parameters;  Discussed complications of poorly controlled blood pressure such as heart disease, stroke, circulatory complications, vision complications, kidney impairment, sexual dysfunction;    Falls:  (Status: Goal on Track (progressing): YES.) Long Term Goal  Advised patient of importance of notifying provider of falls Assessed for falls since last encounter Assessed patients knowledge of fall risk prevention secondary to previously provided education Assessed working status of life alert bracelet and patient adherence Discussed fall precautions and use of rolling walker. No falls since last encounter.  Discussed medical alert system. Previously provided information on [employer] through [facility]. Patient/daughter has not looked into this further. Encouraged them to do so. It would be helpful for her to have in an emergency. Discussed exercise and strengthening activities. Patient still plans on buying a pedal bike that she can use while sitting to work her legs.     Patient Goals/Self-Care Activities: Patient will self administer medications as prescribed Patient will call pharmacy for medication refills Patient will continue to perform ADL's independently Patient will call provider office for new concerns or questions Eat meals at regular intervals to avoid low blood sugar Check blood sugar daily and as needed Bring blood sugar  meter to all appointments Bring log to PCP appointments Call PCP if blood sugar continues to run "Low" Check and record blood pressure 3 times per week and call PCP with any readings outside of recommended range Call [place] as needed [phone] Increase activity level as tolerated No current CCM care Plan since 10/16/21. Changed status to "not enrolled"   Plan: Telephone follow up appointment with care management team member scheduled for:  01/31/22 with Jackelyn Poling, RN for Care Coordination Follow up  Chong Sicilian, BSN, RN-BC [place] / [place] Management Direct Dial: [phone]

## 2022-01-15 ENCOUNTER — Ambulatory Visit: Payer: Medicare Other | Admitting: Licensed Clinical Social Worker

## 2022-01-15 DIAGNOSIS — M069 Rheumatoid arthritis, unspecified: Secondary | ICD-10-CM

## 2022-01-15 DIAGNOSIS — R002 Palpitations: Secondary | ICD-10-CM

## 2022-01-15 DIAGNOSIS — E1169 Type 2 diabetes mellitus with other specified complication: Secondary | ICD-10-CM

## 2022-01-15 DIAGNOSIS — I129 Hypertensive chronic kidney disease with stage 1 through stage 4 chronic kidney disease, or unspecified chronic kidney disease: Secondary | ICD-10-CM

## 2022-01-15 DIAGNOSIS — Z9181 History of falling: Secondary | ICD-10-CM

## 2022-01-15 DIAGNOSIS — R270 Ataxia, unspecified: Secondary | ICD-10-CM

## 2022-01-15 NOTE — Chronic Care Management (AMB) (Signed)
Care Management Clinical Social Work Note  01/15/2022 Name: Erica Crosby MRN: [id number] DOB: 1946-02-03  Erica Crosby is a 76 y.o. year old female who is a primary care patient of Erica Norlander, DO.  The Care Management team was consulted for assistance with chronic disease management and coordination needs.  Engaged with patient by telephone for follow up visit in response to provider referral for social work chronic care management and care coordination services  Consent to Services:  [name] was given information about Care Management services today including:  Care Management services includes personalized support from designated clinical staff supervised by her physician, including individualized plan of care and coordination with other care providers 24/7 contact phone numbers for assistance for urgent and routine care needs. The patient may stop case management services at any time by phone call to the office staff.  Patient agreed to services and consent obtained.   Assessment: Review of patient past medical history, allergies, medications, and health status, including review of relevant consultants reports was performed today as part of a comprehensive evaluation and provision of chronic care management and care coordination services.  SDOH (Social Determinants of Health) assessments and interventions performed:  SDOH Interventions    Flowsheet Row Most Recent Value  SDOH Interventions   Physical Activity Interventions Other (Comments)  [walking challenges. she uses a cane to help her walk]  Stress Interventions Provide Counseling  [client has stress related to managing medical needs]  Depression Interventions/Treatment  Counseling        Advanced Directives Status: See Vynca application for related entries.  Care Plan  No Known Allergies  Outpatient Encounter Medications as of 01/15/2022  Medication Sig   Accu-Chek Softclix Lancets lancets CHECK BLOOD SUGAR  TWICE A DAY AND AS NEEDED Dx E11.21   Adalimumab (HUMIRA PEN) 40 MG/0.4ML PNKT Inject 40 mg into the skin every 14 (fourteen) days. Can restart 06/04/19   aspirin EC 81 MG tablet Take 81 mg by mouth daily.   atorvastatin (LIPITOR) 10 MG tablet Take 1 tablet (10 mg total) by mouth daily.   Blood Glucose Monitoring Suppl (ACCU-CHEK AVIVA PLUS) w/Device KIT Test BS BID and prn Dx E11.21   Continuous Blood Gluc Sensor ([name] 2 SENSOR) MISC USE TO TEST BLOOD SUGAR CONTINUOUSLY. Dx: E11.65. ADVANCED DIABETES SUPPLY VIA PARACHUTE   enalapril (VASOTEC) 20 MG tablet 1 tablet   fluticasone (FLONASE) 50 MCG/ACT nasal spray Place 2 sprays into both nostrils daily.   folic acid (FOLVITE) 1 MG tablet Take 1 tablet (1 mg total) by mouth daily.   glucose blood (ACCU-CHEK AVIVA PLUS) test strip CHECK BLOOD SUGAR 2 TIMES A DAY AND AS NEEDED Dx E11.21   insulin glargine (LANTUS SOLOSTAR) 100 UNIT/ML Solostar Pen Inject 5-10 Units into the skin at bedtime.   Omega-3 Fatty Acids (FISH OIL) 1000 MG CAPS    OPTIMAL-D 1.25 MG (50000 UT) capsule TAKE 1 CAPSULE BY MOUTH ONCE A WEEK FOR 8 DOSES   pantoprazole (PROTONIX) 40 MG tablet Take 1 tablet (40 mg total) by mouth daily.   No facility-administered encounter medications on file as of 01/15/2022.    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 Manage mobility needs   Start Date: 09/04/2021  Expected End Date: 03/12/2022  This Visit's Progress: On track  Recent Progress: On track  Priority: Medium  Note:   Current barriers:   Patient in need of assistance with connecting to community resources for possible help in completing her daily ADLs and other daily tasks Patient is unable to independently navigate community resource options without care coordination support Some mobility issues Vision issues Challenges in managing Diabetes May benefit from in home support of CNA  Clinical Goals:   LCSW to talk with client in next 30 days about client completion of ADLs and client management of mobility issues  Client to communicate with LCSW in next 30 days as needed to discuss in home care support resources for client Client to attend scheduled medical appointments in next 30 days Client to communicate with RNCM Joellyn Quails for scheduled Care Management call from RN on 01/31/22.  Clinical Interventions:  Collaboration with Erica Norlander, DO regarding development and update of comprehensive plan of care as evidenced by provider attestation and co-signature Discussed  client needs with  [name] Discussed client completion of ADLs. Discussed appetite of client  Reviewed walking challenges of client. Client uses a cane to help her walk. Client said she has difficulty navigating stairs. She has fear of falling.  Reviewed medication procurement of  client Encouraged client to call [facility] to make transport arrangements for client.  Reviewed family support. She has support from her daughter, from her son, and from her niece Reviewed mood of client. She said she does get sad occasionally. She is concerned now about her kidney condition and managing her kidney needs. She also is concerned about managing other medical needs.  Discussed her referral to cardiologist. She said she has appointment with Dr. Leonette Monarch, cardiologist in September of 2023 Discussed Care Management support program at [facility]. Client is scheduled to have Care Management call from RN Joellyn Quails on 01/31/22.  LCSW also talked with client about SW support with Di Kindle Saporito LCSW LCSW Theadore Nan is discharging client today from [place]. Client will begin receiving Care Management services with [facility]. Client has first Care Management call with RN Joellyn Quails on 01/31/22. LCSW thanked [place] for participating in [place] program support over a number of months.  Patient Strengths: Has support from her daughter Client attends scheduled medical appointments Takes medications as prescribed  Patient Deficits: Occasional help needed with completing ADLs Some mobility issues  Patient Goals:  Patient will attend scheduled medical appointments in next 30 days Patient will communicate regularly with her daughter in next 30 days to discuss the needs of client -  Follow Up Plan: LCSW Theadore Nan is discharging client today from  [facility] SW services. Client will begin participating in Care Management program support with [facility]     [name].Sarae Nicholes MSW, [place] representative [facility] Care Management [phone]

## 2022-01-15 NOTE — Patient Instructions (Signed)
Visit Information  Thank you for taking time to visit with me today. Please don't hesitate to contact me if I can be of assistance to you before our next scheduled telephone appointment.  Following are the goals we discussed today:   LCSW is discharging client today from [place]. Client will participate now in Care Management services program with [facility]  Please call the care guide team at [phone] if you need to cancel or reschedule your appointment.   If you are experiencing a Mental Health or [place] or need someone to talk to, please call the [facility]: [phone]   Following is a copy of your full plan of care:  Care Plan : [place]  Updates made by Katha Cabal, LCSW since 01/15/2022 12:00 AM     Problem: Coping Skills (General Plan of Care)      Goal: Coping Skills Enhanced'Manage ADLS daily as able. Manage mobility needs   Start Date: 09/04/2021  Expected End Date: 03/12/2022  This Visit's Progress: On track  Recent Progress: On track  Priority: Medium  Note:   Current barriers:   Patient in need of assistance with connecting to community resources for possible help in completing her daily ADLs and other daily tasks Patient is unable to independently navigate community resource options without care coordination support Some mobility issues Vision issues Challenges in managing Diabetes May benefit from in home support of CNA  Clinical Goals:   LCSW to talk with client in next 30 days about client completion of ADLs and client management of mobility issues  Client to communicate with LCSW in next 30 days as needed to discuss in home care support resources for client Client to attend scheduled medical appointments in next 30 days Client to communicate with RNCM Joellyn Quails for scheduled Care Management call from RN on 01/31/22.  Clinical Interventions:  Collaboration with [name], DO regarding  development and update of comprehensive plan of care as evidenced by provider attestation and co-signature Discussed  client needs with  [name] Discussed client completion of ADLs. Discussed appetite of client  Reviewed walking challenges of client. Client uses a cane to help her walk. Client said she has difficulty navigating stairs. She has fear of falling.  Reviewed medication procurement of client Encouraged client to call [facility] to make transport arrangements for client.  Reviewed family support. She has support from her daughter, from her son, and from her niece Reviewed mood of client. She said she does get sad occasionally. She is concerned now about her kidney condition and managing her kidney needs. She also is concerned about managing other medical needs.  Discussed her referral to cardiologist. She said she has appointment with Dr. Leonette Monarch, cardiologist in September of 2023 Discussed Care Management support program at [facility]. Client is scheduled to have Care Management call from RN Joellyn Quails on 01/31/22.  LCSW also talked with client about SW support with Di Kindle Saporito LCSW LCSW Theadore Nan is discharging client today from [place]. Client will begin receiving Care Management services with [facility]. Client has first Care Management call with RN Joellyn Quails on 01/31/22. LCSW thanked [place] for participating in [place] program support over a number of months.  Patient Strengths: Has support from her daughter Client attends scheduled medical appointments Takes medications as prescribed  Patient Deficits: Occasional help needed with completing ADLs Some mobility issues  Patient Goals:  Patient will attend scheduled medical appointments in  next 30 days Patient will communicate regularly with her daughter in next 30 days to discuss the needs of client -  Follow Up Plan: LCSW Theadore Nan is discharging client today from [place]. Client will begin participating in Care  Management program support with [facility].    Ms. Cogar was given information about Care Management services by the embedded care coordination team including:  Care Management services include personalized support from designated clinical staff supervised by her physician, including individualized plan of care and coordination with other care providers 24/7 contact phone numbers for assistance for urgent and routine care needs. The patient may stop CCM services at any time (effective at the end of the month) by phone call to the office staff.  Patient agreed to services and verbal consent obtained.   [name].Marvetta Vohs MSW, [place] Holiday representative [facility] Care Management [phone]

## 2022-01-17 ENCOUNTER — Encounter: Payer: Self-pay | Admitting: *Deleted

## 2022-01-31 ENCOUNTER — Ambulatory Visit: Payer: Self-pay | Admitting: *Deleted

## 2022-01-31 NOTE — Patient Instructions (Signed)
Visit Information  An unsuccessful call was made to [phone] to speak with you today to introduce myself as your new care coordination nurse for your primary care provider office. A voice message could not be left as there is not a voice mailbox set up. Please don't hesitate to contact me if I can be of assistance to you.   Following are the goals we discussed today:   Goals Addressed   None     Our rescheduled appointment is by telephone on 02/07/22 at 1:30 pm  Please call the care guide team at [phone] if you need to cancel or reschedule your appointment.   If you are experiencing a Mental Health or [place] or need someone to talk to, please call the Suicide and Crisis Lifeline: 988 call the [place] National Suicide Prevention Lifeline: 361-261-8115 or TTY: (716)792-2683 TTY 224 359 3801) to talk to a trained counselor call 1-800-273-TALK (toll free, 24 hour hotline) call the [facility]: [phone] call 911   Pending initial outreach  The patient has been provided with contact information for the care management team and has been advised to call with any health related questions or concerns.   [place] Lavina Hamman, RN, BSN, [place] Coordinator Office number [phone]

## 2022-01-31 NOTE — Patient Outreach (Signed)
  Care Coordination   01/31/2022 Name: Erica Crosby MRN: [id number] DOB: [birth year]   Care Coordination Outreach Attempts:  An unsuccessful telephone outreach was attempted today to offer the patient information about available care coordination services as a benefit of their health plan.   Follow Up Plan:  Additional outreach attempts will be made to offer the patient care coordination information and services.   Encounter Outcome:  No Answer  Care Coordination Interventions Activated:  No   Care Coordination Interventions:  No, not indicated    SIG Elaine Middleton [name], RN, BSN, [place] Coordinator Office number [phone]

## 2022-02-01 ENCOUNTER — Other Ambulatory Visit: Payer: Self-pay | Admitting: *Deleted

## 2022-02-01 NOTE — Patient Outreach (Signed)
  Care Coordination   Initial Visit Note   02/01/2022 Name: Erica Crosby MRN: [id number] DOB: 1945-11-11  Erica Crosby is a 76 y.o. year old female who sees [name], DO for primary care. I spoke with  [name] by phone today  What matters to the patients health and wellness today?  Managing appointments- missed nephrology and podiatrist Transportation    Goals Addressed               This Visit's Progress     Patient Stated     manage & attend medical appointments [facility]) (pt-stated)   Not on track     Care Coordination Interventions: Barrier: unreliable medical transportation out of county by family or friends, use of a cane Provided education to patient re: importance of follow up medical visits, local ADTS transportation and [employer] transportation benefit, up to 12 round trips per year within 44 mile radius Reviewed scheduled/upcoming provider appointments including pcp, nephrologist, cardiology Discussed plans with patient for ongoing care management follow up and provided patient with direct contact information for care management team Screening for signs and symptoms of depression related to chronic disease state  Assessed social determinant of health barriers 02/01/22 Assisted with call to nephrologist to rescheduled a missed June 2023 appointment to 02/05/22 1  pm with Dr Moshe Cipro. Assisted with a call to [employer] for scheduling a round trip transportation to this appointment. Discussed further outreach to remind her of this appointment. Will assist with September 2023 after 02/16/22         SDOH assessments and interventions completed:  Yes  SDOH Interventions Today    Flowsheet Row Most Recent Value  SDOH Interventions   Housing Interventions Intervention Not Indicated  Transportation Interventions Patient Resources (Friends/Family), Other (Comment)  [facility] medicare transportation and local medicaid ADTS transportation]         Care Coordination Interventions Activated:  Yes  Care Coordination Interventions:  Yes, provided   Follow up plan: Follow up call scheduled for 02/07/22    Encounter Outcome:  Pt. Visit Completed   Lacinda Curvin [name], RN, BSN, [place] Coordinator Office number [phone]

## 2022-02-01 NOTE — Patient Instructions (Addendum)
Visit Information  Thank you for taking time to visit with me today. Please don't hesitate to contact me if I can be of assistance to you.   Following are the goals we discussed today:   Goals Addressed               This Visit's Progress     Patient Stated     manage & attend medical appointments [facility]) (pt-stated)   Not on track     Care Coordination Interventions: Barrier: unreliable medical transportation out of county by family or friends, use of a cane Provided education to patient re: importance of follow up medical visits, local ADTS transportation and [employer] transportation benefit, up to 12 round trips per year within 40 mile radius Reviewed scheduled/upcoming provider appointments including pcp, nephrologist, cardiology Discussed plans with patient for ongoing care management follow up and provided patient with direct contact information for care management team Screening for signs and symptoms of depression related to chronic disease state  Assessed social determinant of health barriers 02/01/22 Assisted with call to nephrologist to rescheduled a missed June 2023 appointment to 02/05/22 1  pm with Dr Moshe Cipro. Assisted with a call to [employer] for scheduling a round trip transportation to this appointment. Discussed further outreach to remind her of this appointment. Will assist with September 2023 after 02/16/22         Our next appointment is by telephone on 02/07/22 at 1:30 pm  Remember you are scheduled to see Dr Moshe Cipro, nephrology (kidney doctor) on February 08 2022 at 1 pm.  Your [employer] transportation will pick you up to take you.  You are to call the transportation person after you finish the appointment to pick you up to return home.  Please call the care guide team at [phone] if you need to cancel or reschedule your appointment.   If you are experiencing a Mental Health or [place] or need someone to talk  to, please call the Suicide and Crisis Lifeline: 988 call the [place] National Suicide Prevention Lifeline: 8194545959 or TTY: (705) 555-1156 TTY (914)478-9990) to talk to a trained counselor call 1-800-273-TALK (toll free, 24 hour hotline) call the [facility]: ([phone])162-8268 call 911   Patient verbalizes understanding of instructions and care plan provided today and agrees to view in [place]. Active MyChart status and patient understanding of how to access instructions and care plan via MyChart confirmed with patient.     The patient has been provided with contact information for the care management team and has been advised to call with any health related questions or concerns.   [place] Lavina Hamman, RN, BSN, [place] Coordinator Office number [phone]

## 2022-02-05 DIAGNOSIS — E1165 Type 2 diabetes mellitus with hyperglycemia: Secondary | ICD-10-CM | POA: Diagnosis not present

## 2022-02-07 ENCOUNTER — Ambulatory Visit: Payer: Self-pay | Admitting: *Deleted

## 2022-02-07 NOTE — Patient Outreach (Signed)
  Care Coordination   02/07/2022 Name: Erica Crosby MRN: [id number] DOB: [birth year]   Care Coordination Outreach Attempts:  An unsuccessful telephone outreach was attempted today to offer the patient information about available care coordination services as a benefit of their health plan.   Follow Up Plan:  Additional outreach attempts will be made to offer the patient care coordination information and services.   Encounter Outcome:  No Answer  Care Coordination Interventions Activated:  No   Care Coordination Interventions:  No, not indicated    SIG Parley Pidcock [name], RN, BSN, [place] Coordinator Office number [phone]

## 2022-02-07 NOTE — Patient Outreach (Signed)
  Care Coordination   02/07/2022 Name: Erica Crosby MRN: [id number] DOB: 04-Feb-1946   Care Coordination Outreach Attempts:  An unsuccessful telephone outreach was attempted today to offer the patient information about available care coordination services as a benefit of their health plan.  Second attempt as patient does not have a voice mail box set up- unable to leave a message   Follow Up Plan:  Additional outreach attempts will be made to offer the patient care coordination information and services.   Encounter Outcome:  No Answer  Care Coordination Interventions Activated:  No   Care Coordination Interventions:  No, not indicated    SIG Phyllicia Dudek [name], RN, BSN, [place] Coordinator Office number [phone]

## 2022-02-08 DIAGNOSIS — D631 Anemia in chronic kidney disease: Secondary | ICD-10-CM | POA: Diagnosis not present

## 2022-02-08 DIAGNOSIS — I129 Hypertensive chronic kidney disease with stage 1 through stage 4 chronic kidney disease, or unspecified chronic kidney disease: Secondary | ICD-10-CM | POA: Diagnosis not present

## 2022-02-08 DIAGNOSIS — R634 Abnormal weight loss: Secondary | ICD-10-CM | POA: Diagnosis not present

## 2022-02-08 DIAGNOSIS — N179 Acute kidney failure, unspecified: Secondary | ICD-10-CM | POA: Diagnosis not present

## 2022-02-08 DIAGNOSIS — M069 Rheumatoid arthritis, unspecified: Secondary | ICD-10-CM | POA: Diagnosis not present

## 2022-02-08 DIAGNOSIS — N183 Chronic kidney disease, stage 3 unspecified: Secondary | ICD-10-CM | POA: Diagnosis not present

## 2022-02-08 DIAGNOSIS — E1122 Type 2 diabetes mellitus with diabetic chronic kidney disease: Secondary | ICD-10-CM | POA: Diagnosis not present

## 2022-02-09 DIAGNOSIS — Z9109 Other allergy status, other than to drugs and biological substances: Secondary | ICD-10-CM | POA: Diagnosis not present

## 2022-02-09 DIAGNOSIS — I129 Hypertensive chronic kidney disease with stage 1 through stage 4 chronic kidney disease, or unspecified chronic kidney disease: Secondary | ICD-10-CM | POA: Diagnosis not present

## 2022-02-09 DIAGNOSIS — N184 Chronic kidney disease, stage 4 (severe): Secondary | ICD-10-CM | POA: Diagnosis not present

## 2022-02-09 DIAGNOSIS — R8271 Bacteriuria: Secondary | ICD-10-CM | POA: Diagnosis not present

## 2022-02-09 DIAGNOSIS — R000 Tachycardia, unspecified: Secondary | ICD-10-CM | POA: Diagnosis not present

## 2022-02-09 DIAGNOSIS — Z91013 Allergy to seafood: Secondary | ICD-10-CM | POA: Diagnosis not present

## 2022-02-09 DIAGNOSIS — N281 Cyst of kidney, acquired: Secondary | ICD-10-CM | POA: Diagnosis not present

## 2022-02-09 DIAGNOSIS — R1012 Left upper quadrant pain: Secondary | ICD-10-CM | POA: Diagnosis not present

## 2022-02-09 DIAGNOSIS — E1122 Type 2 diabetes mellitus with diabetic chronic kidney disease: Secondary | ICD-10-CM | POA: Diagnosis not present

## 2022-02-09 DIAGNOSIS — M069 Rheumatoid arthritis, unspecified: Secondary | ICD-10-CM | POA: Diagnosis not present

## 2022-02-09 DIAGNOSIS — E8809 Other disorders of plasma-protein metabolism, not elsewhere classified: Secondary | ICD-10-CM | POA: Diagnosis not present

## 2022-02-09 DIAGNOSIS — I447 Left bundle-branch block, unspecified: Secondary | ICD-10-CM | POA: Diagnosis not present

## 2022-02-09 DIAGNOSIS — E875 Hyperkalemia: Secondary | ICD-10-CM | POA: Diagnosis not present

## 2022-02-09 DIAGNOSIS — K219 Gastro-esophageal reflux disease without esophagitis: Secondary | ICD-10-CM | POA: Diagnosis not present

## 2022-02-09 DIAGNOSIS — I2510 Atherosclerotic heart disease of native coronary artery without angina pectoris: Secondary | ICD-10-CM | POA: Diagnosis not present

## 2022-02-09 DIAGNOSIS — I340 Nonrheumatic mitral (valve) insufficiency: Secondary | ICD-10-CM | POA: Diagnosis not present

## 2022-02-09 DIAGNOSIS — R10812 Left upper quadrant abdominal tenderness: Secondary | ICD-10-CM | POA: Diagnosis not present

## 2022-02-09 DIAGNOSIS — R10814 Left lower quadrant abdominal tenderness: Secondary | ICD-10-CM | POA: Diagnosis not present

## 2022-02-09 DIAGNOSIS — Z91041 Radiographic dye allergy status: Secondary | ICD-10-CM | POA: Diagnosis not present

## 2022-02-09 DIAGNOSIS — R54 Age-related physical debility: Secondary | ICD-10-CM | POA: Diagnosis not present

## 2022-02-09 DIAGNOSIS — Z794 Long term (current) use of insulin: Secondary | ICD-10-CM | POA: Diagnosis not present

## 2022-02-09 DIAGNOSIS — Z7982 Long term (current) use of aspirin: Secondary | ICD-10-CM | POA: Diagnosis not present

## 2022-02-09 DIAGNOSIS — Z91040 Latex allergy status: Secondary | ICD-10-CM | POA: Diagnosis not present

## 2022-02-09 DIAGNOSIS — R93422 Abnormal radiologic findings on diagnostic imaging of left kidney: Secondary | ICD-10-CM | POA: Diagnosis not present

## 2022-02-09 DIAGNOSIS — R0789 Other chest pain: Secondary | ICD-10-CM | POA: Diagnosis not present

## 2022-02-09 DIAGNOSIS — R079 Chest pain, unspecified: Secondary | ICD-10-CM | POA: Diagnosis not present

## 2022-02-09 DIAGNOSIS — E785 Hyperlipidemia, unspecified: Secondary | ICD-10-CM | POA: Diagnosis not present

## 2022-02-09 DIAGNOSIS — Z79899 Other long term (current) drug therapy: Secondary | ICD-10-CM | POA: Diagnosis not present

## 2022-02-09 DIAGNOSIS — R10816 Epigastric abdominal tenderness: Secondary | ICD-10-CM | POA: Diagnosis not present

## 2022-02-09 DIAGNOSIS — I214 Non-ST elevation (NSTEMI) myocardial infarction: Secondary | ICD-10-CM | POA: Diagnosis not present

## 2022-02-09 DIAGNOSIS — N390 Urinary tract infection, site not specified: Secondary | ICD-10-CM | POA: Diagnosis not present

## 2022-02-09 DIAGNOSIS — D631 Anemia in chronic kidney disease: Secondary | ICD-10-CM | POA: Diagnosis not present

## 2022-02-09 DIAGNOSIS — N189 Chronic kidney disease, unspecified: Secondary | ICD-10-CM | POA: Diagnosis not present

## 2022-02-10 DIAGNOSIS — K219 Gastro-esophageal reflux disease without esophagitis: Secondary | ICD-10-CM | POA: Insufficient documentation

## 2022-02-10 DIAGNOSIS — D649 Anemia, unspecified: Secondary | ICD-10-CM | POA: Insufficient documentation

## 2022-02-10 DIAGNOSIS — N184 Chronic kidney disease, stage 4 (severe): Secondary | ICD-10-CM | POA: Insufficient documentation

## 2022-02-10 DIAGNOSIS — N189 Chronic kidney disease, unspecified: Secondary | ICD-10-CM | POA: Insufficient documentation

## 2022-02-10 DIAGNOSIS — R079 Chest pain, unspecified: Secondary | ICD-10-CM | POA: Diagnosis not present

## 2022-02-11 DIAGNOSIS — R0789 Other chest pain: Secondary | ICD-10-CM | POA: Insufficient documentation

## 2022-02-11 DIAGNOSIS — M549 Dorsalgia, unspecified: Secondary | ICD-10-CM | POA: Insufficient documentation

## 2022-02-11 DIAGNOSIS — N2889 Other specified disorders of kidney and ureter: Secondary | ICD-10-CM | POA: Insufficient documentation

## 2022-02-12 ENCOUNTER — Ambulatory Visit: Payer: Self-pay | Admitting: *Deleted

## 2022-02-12 ENCOUNTER — Telehealth: Payer: Self-pay

## 2022-02-12 DIAGNOSIS — R9431 Abnormal electrocardiogram [ECG] [EKG]: Secondary | ICD-10-CM | POA: Diagnosis not present

## 2022-02-12 NOTE — Telephone Encounter (Signed)
Transition Care Management Follow-up Telephone Call Date of discharge and from where: 02/11/2022 [facility] - atypical chest pain How have you been since you were released from the hospital? weak Any questions or concerns? No  Items Reviewed: Did the pt receive and understand the discharge instructions provided? Yes  Medications obtained and verified? Yes  Other? No  Any new allergies since your discharge? No  Dietary orders reviewed? Yes Do you have support at home? Yes   Home Care and Equipment/Supplies: Were home health services ordered? No  Were any new equipment or medical supplies ordered?  No  Functional Questionnaire: (I = Independent and D = Dependent) ADLs: I  Bathing/Dressing- I - with daughter's assistance  Meal Prep- I  Eating- I  Maintaining continence- I  Transferring/Ambulation- I  Managing Meds- I  Follow up appointments reviewed:  PCP Hospital f/u appt confirmed? Yes  Scheduled to see Gottschalk on 02/13/22 @ 10:50. [facility] f/u appt confirmed? Yes  Scheduled to see Hochren on 02/28/22 @ 10. Are transportation arrangements needed? No  If their condition worsens, is the pt aware to call PCP or go to the Emergency Dept.? Yes Was the patient provided with contact information for the PCP's office or ED? Yes Was to pt encouraged to call back with questions or concerns? Yes

## 2022-02-12 NOTE — Patient Outreach (Signed)
  Care Coordination   02/12/2022 Name: Erica Crosby MRN: [id number] DOB: 1945/10/04   Care Coordination Outreach Attempts:  A second unsuccessful outreach was attempted today to offer the patient with information about available care coordination services as a benefit of their health plan.    Noted patient with a [facility] 8/25 to 27/23 admission - NSTEMI  Follow Up Plan:  Additional outreach attempts will be made to offer the patient care coordination information and services.   Encounter Outcome:  No Answer  Care Coordination Interventions Activated:  No   Care Coordination Interventions:  No, not indicated    SIG Akshara Blumenthal [name], RN, BSN, [place] Coordinator Office number [phone]

## 2022-02-13 ENCOUNTER — Ambulatory Visit (INDEPENDENT_AMBULATORY_CARE_PROVIDER_SITE_OTHER): Payer: Medicare Other | Admitting: Family Medicine

## 2022-02-13 ENCOUNTER — Other Ambulatory Visit: Payer: Self-pay | Admitting: Family Medicine

## 2022-02-13 VITALS — BP 138/71 | HR 68 | Temp 97.4°F | Ht 67.0 in | Wt 132.0 lb

## 2022-02-13 DIAGNOSIS — E1122 Type 2 diabetes mellitus with diabetic chronic kidney disease: Secondary | ICD-10-CM | POA: Diagnosis not present

## 2022-02-13 DIAGNOSIS — N184 Chronic kidney disease, stage 4 (severe): Secondary | ICD-10-CM

## 2022-02-13 DIAGNOSIS — Z09 Encounter for follow-up examination after completed treatment for conditions other than malignant neoplasm: Secondary | ICD-10-CM | POA: Diagnosis not present

## 2022-02-13 DIAGNOSIS — L989 Disorder of the skin and subcutaneous tissue, unspecified: Secondary | ICD-10-CM

## 2022-02-13 DIAGNOSIS — R0789 Other chest pain: Secondary | ICD-10-CM

## 2022-02-13 DIAGNOSIS — N2889 Other specified disorders of kidney and ureter: Secondary | ICD-10-CM | POA: Diagnosis not present

## 2022-02-13 DIAGNOSIS — I129 Hypertensive chronic kidney disease with stage 1 through stage 4 chronic kidney disease, or unspecified chronic kidney disease: Secondary | ICD-10-CM | POA: Diagnosis not present

## 2022-02-13 MED ORDER — CLOBETASOL PROPIONATE 0.05 % EX SOLN: 1.0000 | Freq: Every day | CUTANEOUS | 1 refills | Status: DC | PRN

## 2022-02-13 NOTE — Telephone Encounter (Signed)
Erica Crosby is a med from Dr Dossie Der.  She has to see him for this because is for her RA.

## 2022-02-13 NOTE — Patient Instructions (Addendum)
You had labs performed today.  You will be contacted with the results of the labs once they are available, usually in the next 3 business days for routine lab work.  If you have an active my chart account, they will be released to your MyChart.  If you prefer to have these labs released to you via telephone, please let us know.    Schedule appt with Dr Moshe Cipro Schedule appt with Dr Dossie Der for Humira. Will arrange the MRI of your kidney to evaluate that nodule

## 2022-02-13 NOTE — Progress Notes (Signed)
Subjective: ML:JQGB follow up for Atypical Chest pain PCP: [name], DO HPI:Erica Crosby is a 76 y.o. female presenting to clinic today for:  1. Atypical chest pain Patient is accompanied today's visit by her daughter.  She was admitted to the hospital with atypical chest pain and had ACS rule out.  She has been recommended to follow-up with her cardiologist however for ongoing ischemic evaluation with stress testing.  She has an appoint with Dr. Percival Spanish in about 2-1/2 weeks.  She has reached out to Dr. Moshe Cipro, her nephrologist, but is awaiting for a sooner appointment with her.  Incidentally, they found a left kidney nodule on CT.  Recommending abdominal MRI with and without contrast to further evaluate.  2.  Scalp irritation As an aside, patient reports scalp irritation and wonders if there is anything that we can do to help with that.  And shoulders has not been especially helpful.  She reports dry, scaly lesions that scab.  She has history of autoimmune disease and sees rheumatology for Humira but she is not currently taking that because she has not been able to see them in a while.   ROS: Per HPI  Allergies  Allergen Reactions   Iodinated Contrast Media    Latex    Shellfish Allergy    Past Medical History:  Diagnosis Date   Anemia    Arthritis    Cataract    Coronary artery disease    Mild plaque 2012   Diabetes mellitus    Essential hypertension 02/10/2012   GERD (gastroesophageal reflux disease)    Hyperlipidemia    Hypertension    Neuropathy    Peptic ulcer    Rheumatoid arthritis ([place])    TIA (transient ischemic attack) 02/10/2012    Current Outpatient Medications:    Accu-Chek Softclix Lancets lancets, CHECK BLOOD SUGAR TWICE A DAY AND AS NEEDED Dx E11.21, Disp: 200 each, Rfl: 3   Adalimumab (HUMIRA PEN) 40 MG/0.4ML PNKT, Inject 40 mg into the skin every 14 (fourteen) days. Can restart 06/04/19, Disp: 2 each, Rfl:    aspirin EC 81 MG tablet,  Take 81 mg by mouth daily., Disp: , Rfl:    atorvastatin (LIPITOR) 10 MG tablet, Take 1 tablet (10 mg total) by mouth daily., Disp: 90 tablet, Rfl: 3   Blood Glucose Monitoring Suppl (ACCU-CHEK AVIVA PLUS) w/Device KIT, Test BS BID and prn Dx E11.21, Disp: 1 kit, Rfl: 0   carvedilol (COREG) 12.5 MG tablet, Take by mouth., Disp: , Rfl:    Cholecalciferol 125 MCG (5000 UT) TABS, Take 1 tablet by mouth daily., Disp: , Rfl:    Continuous Blood Gluc Sensor ([name] 2 SENSOR) MISC, USE TO TEST BLOOD SUGAR CONTINUOUSLY. Dx: E11.65. ADVANCED DIABETES SUPPLY VIA [place], Disp: , Rfl:    enalapril (VASOTEC) 20 MG tablet, 1 tablet, Disp: , Rfl:    fluticasone (FLONASE) 50 MCG/ACT nasal spray, Place 2 sprays into both nostrils daily., Disp: 16 g, Rfl: 6   folic acid (FOLVITE) 1 MG tablet, Take 1 tablet (1 mg total) by mouth daily., Disp: 90 tablet, Rfl: 3   glucose blood (ACCU-CHEK AVIVA PLUS) test strip, CHECK BLOOD SUGAR 2 TIMES A DAY AND AS NEEDED Dx E11.21, Disp: 200 strip, Rfl: 3   insulin glargine (LANTUS SOLOSTAR) 100 UNIT/ML Solostar Pen, Inject 5-10 Units into the skin at bedtime., Disp: 15 mL, Rfl: 2   leflunomide (ARAVA) 20 MG tablet, Take 20 mg by mouth daily., Disp: , Rfl:  Omega-3 Fatty Acids (FISH OIL) 1000 MG CAPS, , Disp: , Rfl:    OPTIMAL-D 1.25 MG (50000 UT) capsule, TAKE 1 CAPSULE BY MOUTH ONCE A WEEK FOR 8 DOSES (Patient not taking: Reported on 02/12/2022), Disp: 8 capsule, Rfl: 0   pantoprazole (PROTONIX) 40 MG tablet, Take 1 tablet (40 mg total) by mouth daily., Disp: 90 tablet, Rfl: 3 Social History   Socioeconomic History   Marital status: Widowed    Spouse name: Not on file   Number of children: 3   Years of education: 57   Highest education level: High school graduate  Occupational History   Occupation: retired  Tobacco Use   Smoking status: Former    Years: 25.00    Types: Cigarettes, E-cigarettes    Quit date: 02/02/2013    Years since quitting: 9.0    Smokeless tobacco: Never  Vaping Use   Vaping Use: Never used  Substance and Sexual Activity   Alcohol use: No   Drug use: No   Sexual activity: Not Currently    Birth control/protection: Post-menopausal  Other Topics Concern   Not on file  Social History Narrative   Daughter stays with her.    Son comes daily when daughter is working   Investment banker, operational of Radio broadcast assistant Strain: Medium Risk (03/08/2021)   Overall Financial Resource Strain (CARDIA)    Difficulty of Paying Living Expenses: Somewhat hard  Food Insecurity: Food Insecurity Present (03/08/2021)   Hunger Vital Sign    Worried About Running Out of Food in the Last Year: Sometimes true    Ran Out of Food in the Last Year: Never true  Transportation Needs: Unmet Transportation Needs (02/01/2022)   PRAPARE - Hydrologist (Medical): Yes    Lack of Transportation (Non-Medical): Yes  Physical Activity: Inactive (01/15/2022)   Exercise Vital Sign    Days of Exercise per Week: 0 days    Minutes of Exercise per Session: 0 min  Stress: Stress Concern Present (01/15/2022)   [place]    Feeling of Stress : To some extent  Social Connections: Socially Isolated (03/08/2021)   Social Connection and Isolation Panel [NHANES]    Frequency of Communication with Friends and Family: More than three times a week    Frequency of Social Gatherings with Friends and Family: Once a week    Attends Religious Services: Never    Marine scientist or Organizations: No    Attends Archivist Meetings: Never    Marital Status: Widowed  Intimate Partner Violence: Not At Risk (02/01/2022)   Humiliation, Afraid, Rape, and Kick questionnaire    Fear of Current or Ex-Partner: No    Emotionally Abused: No    Physically Abused: No    Sexually Abused: No   Family History  Problem Relation Age of Onset   Heart disease Sister         Congeital (died age 50) with "enlarged heart"   CAD Sister    Diabetes Mother    Heart disease Mother        Later onset heart disease    Dementia Mother    Alcohol abuse Father    Liver disease Father    CAD Sister 35       CABG   Early death Sister    Diabetes Brother    Kidney disease Brother        related to DM  Diabetes Brother     Objective: Office vital signs reviewed. BP 138/71   Pulse 68   Temp (!) 97.4 F (36.3 C)   Ht _0  (1.702 m)   Wt 132 lb (59.9 kg)   SpO2 100%   BMI 20.67 kg/m   Physical Examination:  General: Awake, alert, nontoxic elderly female, No acute distress HEENT: MMM.  Scalp is dry Cardio: regular rate and rhythm, S1S2 heard, no murmurs appreciated Pulm: clear to auscultation bilaterally, no wheezes, rhonchi or rales; normal work of breathing on room air MSK: slow gait and normal station  Assessment/ Plan: 76 y.o. 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   I spoke to [facility] radiologist, Dr. Reymundo Poll, her MRI with and without contrast.  He is recommending Gadavast/macrocytic look agent given advanced renal disease.  We will refer pending these imaging results to urology  I encouraged her to set up an appoint with Dr. Moshe Cipro ASAP and I will happily send records and results to her office as soon as they are available  Blood pressure was well controlled after initiation of beta-blocker.  Heart rate normal  For her scalp, clobetasol solution sent to pharmacy.  No orders of the defined types were  placed in this encounter.  No orders of the defined types were placed in this encounter.   Today's visit is for Transitional Care Management.  The patient was discharged from [facility] on [facility]/11/2022 with a primary diagnosis of Atypical chest pain.   Contact with the patient and/or caregiver, by a clinical staff member, was made on 02/12/2022 and was documented as a telephone encounter within the EMR.  Through chart review and discussion with the patient I have determined that management of their condition is of moderate complexity.    [name], DO [place] [phone]

## 2022-02-14 LAB — BASIC METABOLIC PANEL
BUN/Creatinine Ratio: 15 (ref 12–28)
BUN: 42 mg/dL — ABNORMAL HIGH (ref 8–27)
CO2: 20 mmol/L (ref 20–29)
Calcium: 9.1 mg/dL (ref 8.7–10.3)
Chloride: 105 mmol/L (ref 96–106)
Creatinine, Ser: 2.84 mg/dL — ABNORMAL HIGH (ref 0.57–1.00)
Glucose: 227 mg/dL — ABNORMAL HIGH (ref 70–99)
Potassium: 5.1 mmol/L (ref 3.5–5.2)
Sodium: 138 mmol/L (ref 134–144)
eGFR: 17 mL/min/{1.73_m2} — ABNORMAL LOW (ref >59)

## 2022-02-14 LAB — CBC
Hematocrit: 28 % — ABNORMAL LOW (ref 34.0–46.6)
Hemoglobin: 8.6 g/dL — CL (ref 11.1–15.9)
MCH: 27.8 pg (ref 26.6–33.0)
MCHC: 30.7 g/dL — ABNORMAL LOW (ref 31.5–35.7)
MCV: 91 fL (ref 79–97)
Platelets: 227 10*3/uL (ref 150–450)
RBC: 3.09 x10E6/uL — ABNORMAL LOW (ref 3.77–5.28)
RDW: 14.3 % (ref 11.7–15.4)
WBC: 5.2 10*3/uL (ref 3.4–10.8)

## 2022-02-14 NOTE — Telephone Encounter (Signed)
TC from [place] yesterday afternoon about this denial. Pt is not seeing Dr. Dossie Der, since this is for RA suggested if pt is not seeing him she may want to get a referral to another specialist. Pharmacy tech noted this is her account.

## 2022-02-20 ENCOUNTER — Other Ambulatory Visit: Payer: Self-pay | Admitting: Family Medicine

## 2022-02-21 ENCOUNTER — Other Ambulatory Visit: Payer: Self-pay | Admitting: *Deleted

## 2022-02-21 DIAGNOSIS — D649 Anemia, unspecified: Secondary | ICD-10-CM

## 2022-02-22 ENCOUNTER — Encounter: Payer: Self-pay | Admitting: *Deleted

## 2022-02-23 ENCOUNTER — Other Ambulatory Visit (HOSPITAL_COMMUNITY): Payer: Self-pay

## 2022-02-23 ENCOUNTER — Other Ambulatory Visit: Payer: Medicare Other

## 2022-02-23 DIAGNOSIS — D649 Anemia, unspecified: Secondary | ICD-10-CM | POA: Diagnosis not present

## 2022-02-25 LAB — CBC WITH DIFFERENTIAL/PLATELET
Basophils Absolute: 0 10*3/uL (ref 0.0–0.2)
Basos: 1 %
EOS (ABSOLUTE): 0.2 10*3/uL (ref 0.0–0.4)
Eos: 3 %
Hematocrit: 26.8 % — ABNORMAL LOW (ref 34.0–46.6)
Hemoglobin: 8.5 g/dL — CL (ref 11.1–15.9)
Immature Grans (Abs): 0 10*3/uL (ref 0.0–0.1)
Immature Granulocytes: 0 %
Lymphocytes Absolute: 1.7 10*3/uL (ref 0.7–3.1)
Lymphs: 29 %
MCH: 28.3 pg (ref 26.6–33.0)
MCHC: 31.7 g/dL (ref 31.5–35.7)
MCV: 89 fL (ref 79–97)
Monocytes Absolute: 0.4 10*3/uL (ref 0.1–0.9)
Monocytes: 7 %
Neutrophils Absolute: 3.7 10*3/uL (ref 1.4–7.0)
Neutrophils: 60 %
Platelets: 236 10*3/uL (ref 150–450)
RBC: 3 x10E6/uL — ABNORMAL LOW (ref 3.77–5.28)
RDW: 14.4 % (ref 11.7–15.4)
WBC: 6.1 10*3/uL (ref 3.4–10.8)

## 2022-02-26 ENCOUNTER — Encounter (HOSPITAL_COMMUNITY)
Admission: RE | Admit: 2022-02-26 | Discharge: 2022-02-26 | Disposition: A | Discharge status: Home / Self Care | Payer: Medicare Other | Source: Ambulatory Visit | Attending: Nephrology | Admitting: Nephrology

## 2022-02-26 DIAGNOSIS — D631 Anemia in chronic kidney disease: Secondary | ICD-10-CM | POA: Insufficient documentation

## 2022-02-26 DIAGNOSIS — N189 Chronic kidney disease, unspecified: Secondary | ICD-10-CM | POA: Diagnosis not present

## 2022-02-26 DIAGNOSIS — I340 Nonrheumatic mitral (valve) insufficiency: Secondary | ICD-10-CM | POA: Insufficient documentation

## 2022-02-26 MED ORDER — SODIUM CHLORIDE 0.9 % IV SOLN
510.0000 mg | INTRAVENOUS | Status: DC
  Administered 2022-02-26: 510 mg via INTRAVENOUS
  Filled 2022-02-26: qty 510

## 2022-02-26 NOTE — H&P (View-Only) (Signed)
Cardiology Office Note   Date:  02/28/2022   ID:  Erica Crosby, DOB [birth year], MRN [id number]  PCP:  Erica Norlander, DO  Cardiologist:   Erica Breeding, MD Referring:  Erica Norlander, DO  Chief Complaint  Patient presents with   Shortness of Breath      History of Present Illness: Erica Crosby is a 76 y.o. female who presents for evaluation of mitral regurgitation.   Echo in 2020 when she was hospitalized at [facility] demonstrated moderate mitral regurgitation.   This was identified at the time of hospitalization for respiratory failure and COVID.  We will see that she had cardiology follow-up.  She has had some progressive shortness of breath.  Somewhat difficult to assess her symptoms but she is not describing PND or orthopnea.  She gets around with a cane.  She describes some chest pain.  She was seen at [facility] and her proBNP was elevated in the emergency room but chest x-ray was clear.  Troponin was very mildly elevated at 37.  She went to the emergency room.  She had significant anemia and elevated creatinine which they thought were probably baseline for her.  She was found to have on echocardiography well-preserved ejection fraction.  However, she had severe mitral regurgitation.  This was a transthoracic echocardiogram.  I have reviewed this report.  She had some trivial pericardial effusion and left pleural effusion.  It was suggested that she might need perfusion imaging in the future but it was not felt that she was having an acute coronary event.  She had acute on chronic renal insufficiency with a creatinine was 3.23.  An extensive review of outside records it seems like her biggest complaint was really epigastric discomfort.  She says she has not having this any longer.  There was no clear etiology.  She is not now describing any chest discomfort.  She is not having any new PND or orthopnea but she does get short of breath with activities.  She has not had any  weight gain or edema.  She is not having any palpitations, presyncope or syncope.   Past Medical History:  Diagnosis Date   Anemia    Arthritis    Cataract    Coronary artery disease    Mild plaque 2012   Diabetes mellitus    Essential hypertension 02/10/2012   GERD (gastroesophageal reflux disease)    Hyperlipidemia    Hypertension    Neuropathy    Peptic ulcer    Rheumatoid arthritis ([facility])    TIA (transient ischemic attack) 02/10/2012    Past Surgical History:  Procedure Laterality Date   ABSCESS DRAINAGE     CARPAL TUNNEL RELEASE Left    CHOLECYSTECTOMY     KNEE SURGERY Right    NECK SURGERY     x 2   SHOULDER SURGERY Left      Current Outpatient Medications  Medication Sig Dispense Refill   Adalimumab (HUMIRA PEN) 40 MG/0.4ML PNKT Inject 40 mg into the skin every 14 (fourteen) days. Can restart 06/04/19 2 each    aspirin EC 81 MG tablet Take 81 mg by mouth daily.     atorvastatin (LIPITOR) 10 MG tablet Take 1 tablet (10 mg total) by mouth daily. 90 tablet 3   carvedilol (COREG) 12.5 MG tablet Take 12.5 mg by mouth daily.     Cholecalciferol 125 MCG (5000 UT) TABS Take 1 tablet by mouth daily.     clobetasol (  TEMOVATE) 0.05 % external solution Apply 1 Application topically daily as needed (scalp irriration). 50 mL 1   enalapril (VASOTEC) 20 MG tablet 1 tablet     fluticasone (FLONASE) 50 MCG/ACT nasal spray Place 2 sprays into both nostrils daily. 16 g 6   folic acid (FOLVITE) 1 MG tablet Take 1 tablet (1 mg total) by mouth daily. 90 tablet 3   insulin glargine (LANTUS SOLOSTAR) 100 UNIT/ML Solostar Pen Inject 5-10 Units into the skin at bedtime. 15 mL 2   leflunomide (ARAVA) 20 MG tablet Take 20 mg by mouth daily.     Omega-3 Fatty Acids (FISH OIL) 1000 MG CAPS Take 1,000 mg by mouth daily.     Accu-Chek Softclix Lancets lancets CHECK BLOOD SUGAR TWICE A DAY AND AS NEEDED Dx E11.21 200 each 3   Blood Glucose Monitoring Suppl (ACCU-CHEK AVIVA PLUS) w/Device KIT Test  BS BID and prn Dx E11.21 1 kit 0   Continuous Blood Gluc Sensor ([name] 2 SENSOR) MISC USE TO TEST BLOOD SUGAR CONTINUOUSLY. Dx: E11.65. ADVANCED DIABETES SUPPLY VIA PARACHUTE     glucose blood (ACCU-CHEK AVIVA PLUS) test strip CHECK BLOOD SUGAR 2 TIMES A DAY AND AS NEEDED Dx E11.21 200 strip 3   OPTIMAL-D 1.25 MG (50000 UT) capsule TAKE 1 CAPSULE BY MOUTH ONCE A WEEK FOR 8 DOSES 8 capsule 0   pantoprazole (PROTONIX) 40 MG tablet TAKE 1 TABLET DAILY 90 tablet 1   No current facility-administered medications for this visit.    Allergies:   Iodinated contrast media, Latex, and Shellfish allergy    Social History:  The patient  reports that she quit smoking about 9 years ago. Her smoking use included cigarettes and e-cigarettes. She has never used smokeless tobacco. She reports that she does not drink alcohol and does not use drugs.   Family History:  The patient's family history includes Alcohol abuse in her father; CAD in her sister; CAD (age of onset: 40) in her sister; Dementia in her mother; Diabetes in her brother, brother, and mother; Early death in her sister; Heart disease in her mother and sister; Kidney disease in her brother; Liver disease in her father.    ROS:  Please see the history of present illness.   Otherwise, review of systems are positive for none.   All other systems are reviewed and negative.    PHYSICAL EXAM: VS:  BP 124/70   Pulse 76   Ht $R'5\' 7"'zx$  (1.702 m)   Wt 130 lb (59 kg)   BMI 20.36 kg/m  , BMI Body mass index is 20.36 kg/m. GENERAL:  Well appearing HEENT:  Pupils equal round and reactive, fundi not visualized, oral mucosa unremarkable NECK:  No jugular venous distention, waveform within normal limits, carotid upstroke brisk and symmetric, no bruits, no thyromegaly LYMPHATICS:  No cervical, inguinal adenopathy LUNGS:  Clear to auscultation bilaterally BACK:  No CVA tenderness CHEST:  Unremarkable HEART:  PMI not displaced or sustained,S1 and S2  within normal limits, no S3, no S4, no clicks, no rubs, 3 out of 6 holosystolic murmur heard at the apex and radiating into the axilla murmurs ABD:  Flat, positive bowel sounds normal in frequency in pitch, no bruits, no rebound, no guarding, no midline pulsatile mass, no hepatomegaly, no splenomegaly EXT:  2 plus pulses throughout, no edema, no cyanosis no clubbing SKIN:  No rashes no nodules NEURO:  Cranial nerves II through XII grossly intact, motor grossly intact throughout PSYCH:  Cognitively intact, oriented  to person place and time    EKG:  EKG is ordered today. The ekg ordered today demonstrates NSR, rate 76, LBBB, rate 76, LAD.   This is not significantly changed compared to EKG from 2020   Recent Labs: 05/05/2021: ALT 20 02/13/2022: BUN 42; Creatinine, Ser 2.84; Potassium 5.1; Sodium 138 02/23/2022: Hemoglobin 8.5; Platelets 236    Lipid Panel    Component Value Date/Time   CHOL 141 10/14/2020 0950   TRIG 87 10/14/2020 0950   HDL 47 10/14/2020 0950   CHOLHDL 3.0 10/14/2020 0950   CHOLHDL 3.3 02/10/2012 0520   VLDL 17 02/10/2012 0520   LDLCALC 77 10/14/2020 0950      Wt Readings from Last 3 Encounters:  02/28/22 130 lb (59 kg)  02/13/22 132 lb (59.9 kg)  12/15/21 132 lb 6.4 oz (60.1 kg)      Other studies Reviewed: Additional studies/ records that were reviewed today include: Extensive review of records from [facility]. Review of the above records demonstrates:  Please see elsewhere in the note.     ASSESSMENT AND PLAN:  MR: She has severe mitral regurgitation.  The neck step will be a TEE.  We will arrange this.  LBBB: She has no bradycardia arrhythmia.  No change in therapy.  CKD 4: Most recent creatinine was 2.84.  This has slowly crept up over the time.  She has follow-up with nephrology.  Kidney lesion: 2.8 x 2.5 cm high attenuation lesion incidentally found on the left kidney.  This is a follow-up with a dedicated MRI again I will defer to  nephrology.  Anemia: Hemoglobin was most recently down to 8.6.  I will defer to her primary provider further GI work-up.  She has had fecal occult blood testing.  Elevated troponin: This was very minimally elevated in the hospital.  Further work-up will be based on plans subsequent to the TEE.  We might consider perfusion testing in the future but currently she is not having symptoms.  She did have a history of mild coronary plaque over a decade ago on cath she has aortic atherosclerosis  Diabetes mellitus: Per primary care.  Her A1c was 6.1  Dyslipidemia: LDL was actually 68 with an HDL of 54.  Continue current statin although she might benefit from at least moderate dose.  Current medicines are reviewed at length with the patient today.  The patient does not have concerns regarding medicines.  The following changes have been made:  no change  Labs/ tests ordered today include:   Orders Placed This Encounter  Procedures   EKG 12-Lead     Disposition:   FU with me after the TEE.   Signed, Erica Breeding, MD  02/28/2022 12:51 PM    [facility]

## 2022-02-26 NOTE — Progress Notes (Unsigned)
Cardiology Office Note   Date:  02/28/2022   ID:  Erica Crosby, DOB [birth year], MRN [id number]  PCP:  [name], DO  Cardiologist:   Minus Breeding, MD Referring:  [name], DO  Chief Complaint  Patient presents with   Shortness of Breath      History of Present Illness: Erica Crosby is a 76 y.o. female who presents for evaluation of mitral regurgitation.   Echo in 2020 when she was hospitalized at [facility] demonstrated moderate mitral regurgitation.   This was identified at the time of hospitalization for respiratory failure and COVID.  We will see that she had cardiology follow-up.  She has had some progressive shortness of breath.  Somewhat difficult to assess her symptoms but she is not describing PND or orthopnea.  She gets around with a cane.  She describes some chest pain.  She was seen at [facility] and her proBNP was elevated in the emergency room but chest x-ray was clear.  Troponin was very mildly elevated at 37.  She went to the emergency room.  She had significant anemia and elevated creatinine which they thought were probably baseline for her.  She was found to have on echocardiography well-preserved ejection fraction.  However, she had severe mitral regurgitation.  This was a transthoracic echocardiogram.  I have reviewed this report.  She had some trivial pericardial effusion and left pleural effusion.  It was suggested that she might need perfusion imaging in the future but it was not felt that she was having an acute coronary event.  She had acute on chronic renal insufficiency with a creatinine was 3.23.  An extensive review of outside records it seems like her biggest complaint was really epigastric discomfort.  She says she has not having this any longer.  There was no clear etiology.  She is not now describing any chest discomfort.  She is not having any new PND or orthopnea but she does get short of breath with activities.  She has not had any  weight gain or edema.  She is not having any palpitations, presyncope or syncope.   Past Medical History:  Diagnosis Date   Anemia    Arthritis    Cataract    Coronary artery disease    Mild plaque 2012   Diabetes mellitus    Essential hypertension 02/10/2012   GERD (gastroesophageal reflux disease)    Hyperlipidemia    Hypertension    Neuropathy    Peptic ulcer    Rheumatoid arthritis ([facility])    TIA (transient ischemic attack) 02/10/2012    Past Surgical History:  Procedure Laterality Date   ABSCESS DRAINAGE     CARPAL TUNNEL RELEASE Left    CHOLECYSTECTOMY     KNEE SURGERY Right    NECK SURGERY     x 2   SHOULDER SURGERY Left      Current Outpatient Medications  Medication Sig Dispense Refill   Adalimumab (HUMIRA PEN) 40 MG/0.4ML PNKT Inject 40 mg into the skin every 14 (fourteen) days. Can restart 06/04/19 2 each    aspirin EC 81 MG tablet Take 81 mg by mouth daily.     atorvastatin (LIPITOR) 10 MG tablet Take 1 tablet (10 mg total) by mouth daily. 90 tablet 3   carvedilol (COREG) 12.5 MG tablet Take 12.5 mg by mouth daily.     Cholecalciferol 125 MCG (5000 UT) TABS Take 1 tablet by mouth daily.     clobetasol (  TEMOVATE) 0.05 % external solution Apply 1 Application topically daily as needed (scalp irriration). 50 mL 1   enalapril (VASOTEC) 20 MG tablet 1 tablet     fluticasone (FLONASE) 50 MCG/ACT nasal spray Place 2 sprays into both nostrils daily. 16 g 6   folic acid (FOLVITE) 1 MG tablet Take 1 tablet (1 mg total) by mouth daily. 90 tablet 3   insulin glargine (LANTUS SOLOSTAR) 100 UNIT/ML Solostar Pen Inject 5-10 Units into the skin at bedtime. 15 mL 2   leflunomide (ARAVA) 20 MG tablet Take 20 mg by mouth daily.     Omega-3 Fatty Acids (FISH OIL) 1000 MG CAPS Take 1,000 mg by mouth daily.     Accu-Chek Softclix Lancets lancets CHECK BLOOD SUGAR TWICE A DAY AND AS NEEDED Dx E11.21 200 each 3   Blood Glucose Monitoring Suppl (ACCU-CHEK AVIVA PLUS) w/Device KIT Test  BS BID and prn Dx E11.21 1 kit 0   Continuous Blood Gluc Sensor ([name] 2 SENSOR) MISC USE TO TEST BLOOD SUGAR CONTINUOUSLY. Dx: E11.65. ADVANCED DIABETES SUPPLY VIA PARACHUTE     glucose blood (ACCU-CHEK AVIVA PLUS) test strip CHECK BLOOD SUGAR 2 TIMES A DAY AND AS NEEDED Dx E11.21 200 strip 3   OPTIMAL-D 1.25 MG (50000 UT) capsule TAKE 1 CAPSULE BY MOUTH ONCE A WEEK FOR 8 DOSES 8 capsule 0   pantoprazole (PROTONIX) 40 MG tablet TAKE 1 TABLET DAILY 90 tablet 1   No current facility-administered medications for this visit.    Allergies:   Iodinated contrast media, Latex, and Shellfish allergy    Social History:  The patient  reports that she quit smoking about 9 years ago. Her smoking use included cigarettes and e-cigarettes. She has never used smokeless tobacco. She reports that she does not drink alcohol and does not use drugs.   Family History:  The patient's family history includes Alcohol abuse in her father; CAD in her sister; CAD (age of onset: 1) in her sister; Dementia in her mother; Diabetes in her brother, brother, and mother; Early death in her sister; Heart disease in her mother and sister; Kidney disease in her brother; Liver disease in her father.    ROS:  Please see the history of present illness.   Otherwise, review of systems are positive for none.   All other systems are reviewed and negative.    PHYSICAL EXAM: VS:  BP 124/70   Pulse 76   Ht $R'5\' 7"'wn$  (1.702 m)   Wt 130 lb (59 kg)   BMI 20.36 kg/m  , BMI Body mass index is 20.36 kg/m. GENERAL:  Well appearing HEENT:  Pupils equal round and reactive, fundi not visualized, oral mucosa unremarkable NECK:  No jugular venous distention, waveform within normal limits, carotid upstroke brisk and symmetric, no bruits, no thyromegaly LYMPHATICS:  No cervical, inguinal adenopathy LUNGS:  Clear to auscultation bilaterally BACK:  No CVA tenderness CHEST:  Unremarkable HEART:  PMI not displaced or sustained,S1 and S2  within normal limits, no S3, no S4, no clicks, no rubs, 3 out of 6 holosystolic murmur heard at the apex and radiating into the axilla murmurs ABD:  Flat, positive bowel sounds normal in frequency in pitch, no bruits, no rebound, no guarding, no midline pulsatile mass, no hepatomegaly, no splenomegaly EXT:  2 plus pulses throughout, no edema, no cyanosis no clubbing SKIN:  No rashes no nodules NEURO:  Cranial nerves II through XII grossly intact, motor grossly intact throughout PSYCH:  Cognitively intact, oriented  to person place and time    EKG:  EKG is ordered today. The ekg ordered today demonstrates NSR, rate 76, LBBB, rate 76, LAD.   This is not significantly changed compared to EKG from 2020   Recent Labs: 05/05/2021: ALT 20 02/13/2022: BUN 42; Creatinine, Ser 2.84; Potassium 5.1; Sodium 138 02/23/2022: Hemoglobin 8.5; Platelets 236    Lipid Panel    Component Value Date/Time   CHOL 141 10/14/2020 0950   TRIG 87 10/14/2020 0950   HDL 47 10/14/2020 0950   CHOLHDL 3.0 10/14/2020 0950   CHOLHDL 3.3 02/10/2012 0520   VLDL 17 02/10/2012 0520   LDLCALC 77 10/14/2020 0950      Wt Readings from Last 3 Encounters:  02/28/22 130 lb (59 kg)  02/13/22 132 lb (59.9 kg)  12/15/21 132 lb 6.4 oz (60.1 kg)      Other studies Reviewed: Additional studies/ records that were reviewed today include: Extensive review of records from [facility]. Review of the above records demonstrates:  Please see elsewhere in the note.     ASSESSMENT AND PLAN:  MR: She has severe mitral regurgitation.  The neck step will be a TEE.  We will arrange this.  LBBB: She has no bradycardia arrhythmia.  No change in therapy.  CKD 4: Most recent creatinine was 2.84.  This has slowly crept up over the time.  She has follow-up with nephrology.  Kidney lesion: 2.8 x 2.5 cm high attenuation lesion incidentally found on the left kidney.  This is a follow-up with a dedicated MRI again I will defer to  nephrology.  Anemia: Hemoglobin was most recently down to 8.6.  I will defer to her primary provider further GI work-up.  She has had fecal occult blood testing.  Elevated troponin: This was very minimally elevated in the hospital.  Further work-up will be based on plans subsequent to the TEE.  We might consider perfusion testing in the future but currently she is not having symptoms.  She did have a history of mild coronary plaque over a decade ago on cath she has aortic atherosclerosis  Diabetes mellitus: Per primary care.  Her A1c was 6.1  Dyslipidemia: LDL was actually 68 with an HDL of 54.  Continue current statin although she might benefit from at least moderate dose.  Current medicines are reviewed at length with the patient today.  The patient does not have concerns regarding medicines.  The following changes have been made:  no change  Labs/ tests ordered today include:   Orders Placed This Encounter  Procedures   EKG 12-Lead     Disposition:   FU with me after the TEE.   Signed, Minus Breeding, MD  02/28/2022 12:51 PM    [facility]

## 2022-02-28 ENCOUNTER — Encounter: Payer: Self-pay | Admitting: *Deleted

## 2022-02-28 ENCOUNTER — Encounter: Payer: Self-pay | Admitting: Cardiology

## 2022-02-28 ENCOUNTER — Ambulatory Visit (INDEPENDENT_AMBULATORY_CARE_PROVIDER_SITE_OTHER): Payer: Medicare Other | Admitting: Cardiology

## 2022-02-28 VITALS — BP 124/70 | HR 76 | Ht 67.0 in | Wt 130.0 lb

## 2022-02-28 DIAGNOSIS — I340 Nonrheumatic mitral (valve) insufficiency: Secondary | ICD-10-CM | POA: Diagnosis not present

## 2022-02-28 DIAGNOSIS — R002 Palpitations: Secondary | ICD-10-CM

## 2022-02-28 NOTE — Patient Instructions (Signed)
Medication Instructions:  The current medical regimen is effective;  continue present plan and medications.  *If you need a refill on your cardiac medications before your next appointment, please call your pharmacy*  Testing/Procedures: Your physician has requested that you have a TEE. During a TEE, sound waves are used to create images of your heart. It provides your doctor with information about the size and shape of your heart and how well your heart's chambers and valves are working. In this test, a transducer is attached to the end of a flexible tube that's guided down your throat and into your esophagus (the tube leading from you mouth to your stomach) to get a more detailed image of your heart. You are not awake for the procedure. Please see the instruction sheet given to you today. For further information please visit [email or web address].  Follow-Up: At [facility], you and your health needs are our priority.  As part of our continuing mission to provide you with exceptional heart care, we have created designated Provider Care Teams.  These Care Teams include your primary Cardiologist (physician) and Advanced Practice Providers (APPs -  Physician Assistants and Nurse Practitioners) who all work together to provide you with the care you need, when you need it.  We recommend signing up for the patient portal called "MyChart".  Sign up information is provided on this After Visit Summary.  MyChart is used to connect with patients for Virtual Visits (Telemedicine).  Patients are able to view lab/test results, encounter notes, upcoming appointments, etc.  Non-urgent messages can be sent to your provider as well.   To learn more about what you can do with MyChart, go to [email or web address].    Your next appointment:   1 month(s)  The format for your next appointment:   In Person  Provider:   Minus Breeding, MD     Important Information About Sugar

## 2022-03-05 ENCOUNTER — Other Ambulatory Visit: Payer: Self-pay | Admitting: Family Medicine

## 2022-03-05 ENCOUNTER — Telehealth: Payer: Self-pay | Admitting: Family Medicine

## 2022-03-05 ENCOUNTER — Encounter (HOSPITAL_COMMUNITY)
Admission: RE | Admit: 2022-03-05 | Discharge: 2022-03-05 | Disposition: A | Discharge status: Home / Self Care | Payer: Medicare Other | Source: Ambulatory Visit | Attending: Nephrology | Admitting: Nephrology

## 2022-03-05 DIAGNOSIS — M069 Rheumatoid arthritis, unspecified: Secondary | ICD-10-CM

## 2022-03-05 DIAGNOSIS — E1122 Type 2 diabetes mellitus with diabetic chronic kidney disease: Secondary | ICD-10-CM

## 2022-03-05 DIAGNOSIS — N189 Chronic kidney disease, unspecified: Secondary | ICD-10-CM | POA: Diagnosis not present

## 2022-03-05 DIAGNOSIS — D631 Anemia in chronic kidney disease: Secondary | ICD-10-CM | POA: Diagnosis not present

## 2022-03-05 MED ORDER — ENALAPRIL MALEATE 20 MG PO TABS: ORAL_TABLET | ORAL | 3 refills | Status: DC

## 2022-03-05 MED ORDER — SODIUM CHLORIDE 0.9 % IV SOLN
510.0000 mg | INTRAVENOUS | Status: DC
  Administered 2022-03-05: 510 mg via INTRAVENOUS
  Filled 2022-03-05: qty 510

## 2022-03-05 NOTE — Telephone Encounter (Signed)
done

## 2022-03-08 ENCOUNTER — Ambulatory Visit (HOSPITAL_COMMUNITY): Admission: RE | Admit: 2022-03-08 | Payer: Medicare Other | Source: Ambulatory Visit

## 2022-03-08 DIAGNOSIS — E1165 Type 2 diabetes mellitus with hyperglycemia: Secondary | ICD-10-CM | POA: Diagnosis not present

## 2022-03-12 ENCOUNTER — Ambulatory Visit (HOSPITAL_BASED_OUTPATIENT_CLINIC_OR_DEPARTMENT_OTHER)
Admission: RE | Admit: 2022-03-12 | Discharge: 2022-03-12 | Disposition: A | Discharge status: Home / Self Care | Payer: Medicare Other | Source: Ambulatory Visit | Attending: Cardiovascular Disease | Admitting: Cardiovascular Disease

## 2022-03-12 ENCOUNTER — Ambulatory Visit (HOSPITAL_COMMUNITY): Payer: Medicare Other | Admitting: Certified Registered Nurse Anesthetist

## 2022-03-12 ENCOUNTER — Ambulatory Visit (HOSPITAL_COMMUNITY)
Admission: RE | Admit: 2022-03-12 | Discharge: 2022-03-12 | Disposition: A | Discharge status: Home / Self Care | Payer: Medicare Other | Attending: Cardiovascular Disease | Admitting: Cardiovascular Disease

## 2022-03-12 ENCOUNTER — Ambulatory Visit (HOSPITAL_BASED_OUTPATIENT_CLINIC_OR_DEPARTMENT_OTHER): Payer: Medicare Other | Admitting: Certified Registered Nurse Anesthetist

## 2022-03-12 ENCOUNTER — Encounter (HOSPITAL_COMMUNITY)
Admission: RE | Disposition: A | Discharge status: Home / Self Care | Payer: Self-pay | Source: Home / Self Care | Attending: Cardiovascular Disease

## 2022-03-12 ENCOUNTER — Encounter (HOSPITAL_COMMUNITY): Payer: Self-pay | Admitting: Cardiovascular Disease

## 2022-03-12 ENCOUNTER — Other Ambulatory Visit: Payer: Self-pay

## 2022-03-12 DIAGNOSIS — N184 Chronic kidney disease, stage 4 (severe): Secondary | ICD-10-CM | POA: Insufficient documentation

## 2022-03-12 DIAGNOSIS — I129 Hypertensive chronic kidney disease with stage 1 through stage 4 chronic kidney disease, or unspecified chronic kidney disease: Secondary | ICD-10-CM | POA: Insufficient documentation

## 2022-03-12 DIAGNOSIS — Z794 Long term (current) use of insulin: Secondary | ICD-10-CM | POA: Diagnosis not present

## 2022-03-12 DIAGNOSIS — D649 Anemia, unspecified: Secondary | ICD-10-CM | POA: Insufficient documentation

## 2022-03-12 DIAGNOSIS — I10 Essential (primary) hypertension: Secondary | ICD-10-CM | POA: Diagnosis not present

## 2022-03-12 DIAGNOSIS — I447 Left bundle-branch block, unspecified: Secondary | ICD-10-CM | POA: Diagnosis not present

## 2022-03-12 DIAGNOSIS — E785 Hyperlipidemia, unspecified: Secondary | ICD-10-CM | POA: Diagnosis not present

## 2022-03-12 DIAGNOSIS — I700 Atherosclerosis of aorta: Secondary | ICD-10-CM | POA: Insufficient documentation

## 2022-03-12 DIAGNOSIS — I340 Nonrheumatic mitral (valve) insufficiency: Secondary | ICD-10-CM | POA: Diagnosis not present

## 2022-03-12 DIAGNOSIS — E1122 Type 2 diabetes mellitus with diabetic chronic kidney disease: Secondary | ICD-10-CM | POA: Diagnosis not present

## 2022-03-12 DIAGNOSIS — Z87891 Personal history of nicotine dependence: Secondary | ICD-10-CM

## 2022-03-12 DIAGNOSIS — I2510 Atherosclerotic heart disease of native coronary artery without angina pectoris: Secondary | ICD-10-CM | POA: Diagnosis not present

## 2022-03-12 DIAGNOSIS — R778 Other specified abnormalities of plasma proteins: Secondary | ICD-10-CM | POA: Diagnosis not present

## 2022-03-12 HISTORY — PX: TEE WITHOUT CARDIOVERSION: SHX5443

## 2022-03-12 LAB — GLUCOSE, CAPILLARY: Glucose-Capillary: 115 mg/dL — ABNORMAL HIGH (ref 70–99)

## 2022-03-12 LAB — ECHO TEE
MV M vel: 6.62 m/s
MV Peak grad: 175.1 mmHg
P 1/2 time: 77.7 msec
Radius: 0.9 cm

## 2022-03-12 SURGERY — ECHOCARDIOGRAM, TRANSESOPHAGEAL
Anesthesia: Monitor Anesthesia Care

## 2022-03-12 MED ORDER — BUTAMBEN-TETRACAINE-BENZOCAINE 2-2-14 % EX AERO
INHALATION_SPRAY | CUTANEOUS | Status: DC | PRN
  Administered 2022-03-12: 2 via TOPICAL

## 2022-03-12 MED ORDER — PROPOFOL 500 MG/50ML IV EMUL
INTRAVENOUS | Status: DC | PRN
  Administered 2022-03-12: 75 ug/kg/min via INTRAVENOUS

## 2022-03-12 MED ORDER — SODIUM CHLORIDE 0.9 % IV SOLN
INTRAVENOUS | Status: DC
  Administered 2022-03-12: 500 mL via INTRAVENOUS

## 2022-03-12 MED ORDER — PHENYLEPHRINE HCL-NACL 20-0.9 MG/250ML-% IV SOLN
INTRAVENOUS | Status: DC | PRN
  Administered 2022-03-12: 40 ug/min via INTRAVENOUS

## 2022-03-12 MED ORDER — PROPOFOL 10 MG/ML IV BOLUS
INTRAVENOUS | Status: DC | PRN
  Administered 2022-03-12: 60 mg via INTRAVENOUS

## 2022-03-12 NOTE — Progress Notes (Signed)
  Echocardiogram Echocardiogram Transesophageal has been performed.  Bobbye Charleston 03/12/2022, 10:18 AM

## 2022-03-12 NOTE — Anesthesia Postprocedure Evaluation (Signed)
Anesthesia Post Note  Patient: Erica Crosby  Procedure(s) Performed: TRANSESOPHAGEAL ECHOCARDIOGRAM (TEE)     Patient location during evaluation: PACU Anesthesia Type: MAC Level of consciousness: awake and alert Pain management: pain level controlled Vital Signs Assessment: post-procedure vital signs reviewed and stable Respiratory status: spontaneous breathing, nonlabored ventilation, respiratory function stable and patient connected to nasal cannula oxygen Cardiovascular status: stable and blood pressure returned to baseline Postop Assessment: no apparent nausea or vomiting Anesthetic complications: no   No notable events documented.  Last Vitals:  Vitals:   03/12/22 1025 03/12/22 1030  BP: (!) 144/60 (!) 156/62  Pulse: 66 69  Resp: 12 12  Temp:    SpO2: 100% 99%    Last Pain:  Vitals:   03/12/22 1030  TempSrc:   PainSc: 0-No pain                 Effie Berkshire

## 2022-03-12 NOTE — Op Note (Signed)
INDICATIONS: Mitral insufficiency  PROCEDURE:   Informed consent was obtained prior to the procedure. The risks, benefits and alternatives for the procedure were discussed and the patient comprehended these risks.  Risks include, but are not limited to, cough, sore throat, vomiting, nausea, somnolence, esophageal and stomach trauma or perforation, bleeding, low blood pressure, aspiration, pneumonia, infection, trauma to the teeth and death.    After a procedural time-out, the oropharynx was anesthetized with 20% benzocaine spray.   During this procedure the patient was administered IV propofol by Anesthesiology (Dr. Smith Robert).  The transesophageal probe was inserted in the esophagus and stomach without difficulty and multiple views were obtained.  The patient was kept under observation until the patient left the procedure room.  The patient left the procedure room in stable condition.   Agitated microbubble saline contrast was not administered.  COMPLICATIONS:    There were no immediate complications.  FINDINGS:  Probably rheumatic/postinflammatory mitral valve changes with shortened chordae and leaflet restriction. Moderate central mitral insufficiency. Other valves normal. LVH, normal LV systolic function. Mild aortic atherosclerosis.  RECOMMENDATIONS:    Monitor with periodic echo.  Time Spent Directly with the Patient:  30 minutes   Erica Crosby 03/12/2022, 10:03 AM

## 2022-03-12 NOTE — Transfer of Care (Signed)
Immediate Anesthesia Transfer of Care Note  Patient: Erica Crosby  Procedure(s) Performed: TRANSESOPHAGEAL ECHOCARDIOGRAM (TEE)  Patient Location: PACU and Endoscopy Unit  Anesthesia Type:MAC  Level of Consciousness: awake, alert  and oriented  Airway & Oxygen Therapy: Patient Spontanous Breathing  Post-op Assessment: Report given to RN, Post -op Vital signs reviewed and stable, Patient moving all extremities X 4 and Patient able to stick tongue midline  Post vital signs: Reviewed  Last Vitals:  Vitals Value Taken Time  BP 149/56 03/12/22 1008  Temp 36.1 C 03/12/22 1008  Pulse 61 03/12/22 1008  Resp 15 03/12/22 1008  SpO2 99 % 03/12/22 1008  Vitals shown include unvalidated device data.  Last Pain:  Vitals:   03/12/22 1008  TempSrc: Temporal  PainSc:          Complications: No notable events documented.

## 2022-03-12 NOTE — Interval H&P Note (Signed)
History and Physical Interval Note:  03/12/2022 9:06 AM  Erica Crosby  has presented today for surgery, with the diagnosis of mitral regurgitation.  The various methods of treatment have been discussed with the patient and family. After consideration of risks, benefits and other options for treatment, the patient has consented to  Procedure(s): TRANSESOPHAGEAL ECHOCARDIOGRAM (TEE) (N/A) as a surgical intervention.  The patient's history has been reviewed, patient examined, no change in status, stable for surgery.  I have reviewed the patient's chart and labs.  Questions were answered to the patient's satisfaction.     [name]

## 2022-03-12 NOTE — Discharge Instructions (Signed)

## 2022-03-12 NOTE — Anesthesia Preprocedure Evaluation (Addendum)
Anesthesia Evaluation  Patient identified by MRN, date of birth, ID band Patient awake    Reviewed: Allergy & Precautions, NPO status , Patient's Chart, lab work & pertinent test results  Airway Mallampati: I  TM Distance: >3 FB Neck ROM: Full    Dental  (+) Edentulous Upper, Edentulous Lower   Pulmonary former smoker,    breath sounds clear to auscultation       Cardiovascular hypertension, Pt. on home beta blockers and Pt. on medications + CAD   Rhythm:Regular Rate:Normal + Systolic murmurs    Neuro/Psych TIAnegative psych ROS   GI/Hepatic Neg liver ROS, PUD, GERD  Medicated,  Endo/Other  diabetes, Type 2, Insulin Dependent  Renal/GU Renal disease     Musculoskeletal  (+) Arthritis , Rheumatoid disorders,    Abdominal Normal abdominal exam  (+)   Peds  Hematology negative hematology ROS (+)   Anesthesia Other Findings   Reproductive/Obstetrics                            Anesthesia Physical Anesthesia Plan  ASA: 3  Anesthesia Plan: MAC   Post-op Pain Management:    Induction: Intravenous  PONV Risk Score and Plan: 0 and Propofol infusion  Airway Management Planned: Natural Airway and Simple Face Mask  Additional Equipment: None  Intra-op Plan:   Post-operative Plan:   Informed Consent: I have reviewed the patients History and Physical, chart, labs and discussed the procedure including the risks, benefits and alternatives for the proposed anesthesia with the patient or authorized representative who has indicated his/her understanding and acceptance.       Plan Discussed with: CRNA  Anesthesia Plan Comments:        Anesthesia Quick Evaluation

## 2022-03-13 ENCOUNTER — Other Ambulatory Visit: Payer: Self-pay | Admitting: Family Medicine

## 2022-03-13 ENCOUNTER — Encounter (HOSPITAL_COMMUNITY): Payer: Self-pay | Admitting: Cardiovascular Disease

## 2022-03-26 NOTE — Progress Notes (Unsigned)
Cardiology Office Note   Date:  03/26/2022   ID:  Erica Crosby, DOB [birth year], MRN [id number]  PCP:  [name], DO  Cardiologist:   Minus Breeding, MD Referring:  [name], DO  No chief complaint on file.     History of Present Illness: Erica Crosby is a 76 y.o. female who presents for evaluation of mitral regurgitation.   Echo in 2020 when she was hospitalized at [facility] demonstrated moderate mitral regurgitation.   This was identified at the time of hospitalization for respiratory failure and COVID.  We will see that she had cardiology follow-up.  She has had some progressive shortness of breath.  Somewhat difficult to assess her symptoms but she is not describing PND or orthopnea.  She gets around with a cane.  She describes some chest pain.  She was seen at [facility] and her proBNP was elevated in the emergency room but chest x-ray was clear.  Troponin was very mildly elevated at 37.  She went to the emergency room.  She had significant anemia and elevated creatinine which they thought were probably baseline for her.  She was found to have on echocardiography well-preserved ejection fraction.  However, she had severe mitral regurgitation.  This was a transthoracic echocardiogram.  I have reviewed this report.  She had some trivial pericardial effusion and left pleural effusion.  It was suggested that she might need perfusion imaging in the future but it was not felt that she was having an acute coronary event.  She had acute on chronic renal insufficiency with a creatinine was 3.23.  Since I last saw her ***   ***  An extensive review of outside records it seems like her biggest complaint was really epigastric discomfort.  She says she has not having this any longer.  There was no clear etiology.  She is not now describing any chest discomfort.  She is not having any new PND or orthopnea but she does get short of breath with activities.  She has not had any  weight gain or edema.  She is not having any palpitations, presyncope or syncope.   Past Medical History:  Diagnosis Date   Anemia    Arthritis    Cataract    Coronary artery disease    Mild plaque 2012   Diabetes mellitus    Essential hypertension 02/10/2012   GERD (gastroesophageal reflux disease)    Hyperlipidemia    Hypertension    Neuropathy    Peptic ulcer    Rheumatoid arthritis ([place])    TIA (transient ischemic attack) 02/10/2012    Past Surgical History:  Procedure Laterality Date   ABSCESS DRAINAGE     CARPAL TUNNEL RELEASE Left    CHOLECYSTECTOMY     KNEE SURGERY Right    NECK SURGERY     x 2   SHOULDER SURGERY Left    TEE WITHOUT CARDIOVERSION N/A 03/12/2022   Procedure: TRANSESOPHAGEAL ECHOCARDIOGRAM (TEE);  Surgeon: Sanda Klein, MD;  Location: [facility] END[facility];  Service: Cardiovascular;  Laterality: N/A;     Current Outpatient Medications  Medication Sig Dispense Refill   Accu-Chek Softclix Lancets lancets CHECK BLOOD SUGAR TWICE A DAY AND AS NEEDED Dx E11.21 200 each 3   aspirin EC 81 MG tablet Take 81 mg by mouth daily.     atorvastatin (LIPITOR) 10 MG tablet Take 1 tablet (10 mg total) by mouth daily. 90 tablet 3   Blood Glucose Monitoring Suppl (ACCU-CHEK  AVIVA PLUS) w/Device KIT Test BS BID and prn Dx E11.21 1 kit 0   Cholecalciferol 125 MCG (5000 UT) TABS Take 5,000 Units by mouth daily.     clobetasol (TEMOVATE) 0.05 % external solution Apply 1 Application topically daily as needed (scalp irriration). 50 mL 1   Continuous Blood Gluc Sensor ([name] 2 SENSOR) MISC USE TO TEST BLOOD SUGAR CONTINUOUSLY. Dx: E11.65. ADVANCED DIABETES SUPPLY VIA PARACHUTE     enalapril (VASOTEC) 20 MG tablet 1 Tablet PO qd for blood pressure 90 tablet 3   fluticasone (FLONASE) 50 MCG/ACT nasal spray Place 2 sprays into both nostrils daily. 16 g 6   folic acid (FOLVITE) 1 MG tablet TAKE 1 TABLET DAILY 90 tablet 1   glucose blood (ACCU-CHEK AVIVA PLUS) test strip CHECK  BLOOD SUGAR TWICE DAILY AND AS NEEDED 200 strip 3   insulin glargine (LANTUS SOLOSTAR) 100 UNIT/ML Solostar Pen Inject 5-10 Units into the skin at bedtime. 15 mL 2   leflunomide (ARAVA) 20 MG tablet Take 20 mg by mouth daily.     Omega-3 Fatty Acids (FISH OIL) 1000 MG CAPS Take 1,000 mg by mouth daily.     OPTIMAL-D 1.25 MG (50000 UT) capsule TAKE 1 CAPSULE BY MOUTH ONCE A WEEK FOR 8 DOSES 8 capsule 0   pantoprazole (PROTONIX) 40 MG tablet TAKE 1 TABLET DAILY 90 tablet 1   No current facility-administered medications for this visit.    Allergies:   Iodinated contrast media, Latex, and Shellfish allergy    ROS:  Please see the history of present illness.   Otherwise, review of systems are positive for ***.   All other systems are reviewed and negative.    PHYSICAL EXAM: VS:  There were no vitals taken for this visit. , BMI There is no height or weight on file to calculate BMI. GENERAL:  Well appearing NECK:  No jugular venous distention, waveform within normal limits, carotid upstroke brisk and symmetric, no bruits, no thyromegaly LUNGS:  Clear to auscultation bilaterally CHEST:  Unremarkable HEART:  PMI not displaced or sustained,S1 and S2 within normal limits, no S3, no S4, no clicks, no rubs, *** murmurs ABD:  Flat, positive bowel sounds normal in frequency in pitch, no bruits, no rebound, no guarding, no midline pulsatile mass, no hepatomegaly, no splenomegaly EXT:  2 plus pulses throughout, no edema, no cyanosis no clubbing    ***GENERAL:  Well appearing HEENT:  Pupils equal round and reactive, fundi not visualized, oral mucosa unremarkable NECK:  No jugular venous distention, waveform within normal limits, carotid upstroke brisk and symmetric, no bruits, no thyromegaly LYMPHATICS:  No cervical, inguinal adenopathy LUNGS:  Clear to auscultation bilaterally BACK:  No CVA tenderness CHEST:  Unremarkable HEART:  PMI not displaced or sustained,S1 and S2 within normal limits, no S3,  no S4, no clicks, no rubs, 3 out of 6 holosystolic murmur heard at the apex and radiating into the axilla murmurs ABD:  Flat, positive bowel sounds normal in frequency in pitch, no bruits, no rebound, no guarding, no midline pulsatile mass, no hepatomegaly, no splenomegaly EXT:  2 plus pulses throughout, no edema, no cyanosis no clubbing SKIN:  No rashes no nodules NEURO:  Cranial nerves II through XII grossly intact, motor grossly intact throughout PSYCH:  Cognitively intact, oriented to person place and time    EKG:  EKG is *** ordered today. The ekg ordered today demonstrates NSR, rate 76, LBBB, rate ***, LAD.      Recent Labs:  05/05/2021: ALT 20 02/13/2022: BUN 42; Creatinine, Ser 2.84; Potassium 5.1; Sodium 138 02/23/2022: Hemoglobin 8.5; Platelets 236    Lipid Panel    Component Value Date/Time   CHOL 141 10/14/2020 0950   TRIG 87 10/14/2020 0950   HDL 47 10/14/2020 0950   CHOLHDL 3.0 10/14/2020 0950   CHOLHDL 3.3 02/10/2012 0520   VLDL 17 02/10/2012 0520   LDLCALC 77 10/14/2020 0950      Wt Readings from Last 3 Encounters:  03/12/22 130 lb 1.1 oz (59 kg)  03/05/22 130 lb (59 kg)  02/28/22 130 lb (59 kg)      Other studies Reviewed: Additional studies/ records that were reviewed today include: *** Review of the above records demonstrates:  Please see elsewhere in the note.     ASSESSMENT AND PLAN:  MR:    ***  She has severe mitral regurgitation.  The neck step will be a TEE.  We will arrange this.  LBBB:   ***   She has no bradycardia arrhythmia.  No change in therapy.  CKD 4: Most recent creatinine was *** 2.84.  This has slowly crept up over the time.  She has follow-up with nephrology.  Kidney lesion: 2.8 x 2.5 cm high attenuation lesion incidentally found on the left kidney.  *** This is a follow-up with a dedicated MRI again I will defer to nephrology.  Anemia: Hemoglobin was most recently *** down to 8.6.  I will defer to her primary provider further GI  work-up.  She has had fecal occult blood testing.  Elevated troponin:   ***  This was very minimally elevated in the hospital.  Further work-up will be based on plans subsequent to the TEE.  We might consider perfusion testing in the future but currently she is not having symptoms.  She did have a history of mild coronary plaque over a decade ago on cath she has aortic atherosclerosis  Diabetes mellitus: Per primary care.  Her A1c was *** 6.1  Dyslipidemia: LDL was actually 68 with an HDL of *** 54.  Continue current statin although she might benefit from at least moderate dose.  Current medicines are reviewed at length with the patient today.  The patient does not have concerns regarding medicines.  The following changes have been made:  ***  Labs/ tests ordered today include: ***  No orders of the defined types were placed in this encounter.    Disposition:   FU with *** .   Signed, Minus Breeding, MD  03/26/2022 8:35 PM    [place]

## 2022-03-28 ENCOUNTER — Encounter: Payer: Self-pay | Admitting: Cardiology

## 2022-03-28 ENCOUNTER — Ambulatory Visit: Payer: Medicare Other | Admitting: Cardiology

## 2022-03-28 VITALS — BP 128/68 | HR 80 | Ht 67.0 in | Wt 125.0 lb

## 2022-03-28 DIAGNOSIS — E118 Type 2 diabetes mellitus with unspecified complications: Secondary | ICD-10-CM | POA: Diagnosis not present

## 2022-03-28 DIAGNOSIS — R7989 Other specified abnormal findings of blood chemistry: Secondary | ICD-10-CM

## 2022-03-28 DIAGNOSIS — E785 Hyperlipidemia, unspecified: Secondary | ICD-10-CM

## 2022-03-28 DIAGNOSIS — I340 Nonrheumatic mitral (valve) insufficiency: Secondary | ICD-10-CM | POA: Diagnosis not present

## 2022-03-28 DIAGNOSIS — I447 Left bundle-branch block, unspecified: Secondary | ICD-10-CM

## 2022-03-28 DIAGNOSIS — N184 Chronic kidney disease, stage 4 (severe): Secondary | ICD-10-CM

## 2022-03-28 NOTE — Patient Instructions (Signed)
Medication Instructions:  The current medical regimen is effective;  continue present plan and medications.  *If you need a refill on your cardiac medications before your next appointment, please call your pharmacy*  Follow-Up: At [facility], you and your health needs are our priority.  As part of our continuing mission to provide you with exceptional heart care, we have created designated Provider Care Teams.  These Care Teams include your primary Cardiologist (physician) and Advanced Practice Providers (APPs -  Physician Assistants and Nurse Practitioners) who all work together to provide you with the care you need, when you need it.  We recommend signing up for the patient portal called "MyChart".  Sign up information is provided on this After Visit Summary.  MyChart is used to connect with patients for Virtual Visits (Telemedicine).  Patients are able to view lab/test results, encounter notes, upcoming appointments, etc.  Non-urgent messages can be sent to your provider as well.   To learn more about what you can do with MyChart, go to [email or web address].    Your next appointment:   3 month(s)  The format for your next appointment:   In Person  Provider:   James Hochrein, MD     Important Information About Sugar       

## 2022-03-29 ENCOUNTER — Ambulatory Visit: Payer: Self-pay | Admitting: *Deleted

## 2022-03-29 NOTE — Patient Outreach (Addendum)
Care Coordination   Follow Up Visit Note   03/30/2022 Name: Erica Crosby MRN: [id number] DOB: 1946-03-14  Erica Crosby is a 76 y.o. year old female who sees [name], DO for primary care. I spoke with  [name] by phone today.  What matters to the patients health and wellness today?  Reviewed her recent cardiology visit after her 03/12/22 mitral regurgitation surgery (Dr Sallyanne Kuster) She reports she did not take her medication packages nor was her daughter able to go with her to the cardiology appointment  She reports she will see [name], her daughter tomorrow & this weekend to review the cardiology visit and after summary visit sheets   Lip & mouth swelling with the inability to wear her dentures anymore  She reports discussing this lip swelling with her cardiologist on 03/28/22    Medication identification  She reports "taking a pill I don't recognize" It is described to be a long white tab that was started during her recent [place] admission around 02/09/22   A female family member was able to read the patient's pill package to RN /  The pills in the pack includes Pantoprazole 40 mg Aspirin 81 mg, Atorvastatin 10 mg, *cholecalciferol 125 mcg (noted with review of [facility] chart to be listed to start on the discharge summary sheets), fish oil, *enalapril,&  folic acid Discussed the purpose of each medicine and discussed the calcium was the last medication listed on her EPIC medication list to have been ordered Discuss Vasotec (enalapril)  is known to cause swelling    Goals Addressed               This Visit's Progress     Patient Stated     manage & attend medical appointments [facility]) (pt-stated)   On track     Care Coordination Interventions: Barrier: unreliable medical transportation out of county by family or friends, use of a cane Evaluation of current treatment plan related to follow up office visits and patient's adherence to plan as established by  provider Discussed plans with patient for ongoing care management follow up and provided patient with direct contact information for care management team Confirmed she has been to follow up pcp and cardiology visits        possible side effect from medicine [facility] ) (pt-stated)   Not on track     Care Coordination Interventions: Evaluation of current treatment plan related to swelling of mouth and lip and patient's adherence to plan as established by provider Reviewed medications with patient and discussed swelling side effects Discussed plans with patient for ongoing care management follow up and provided patient with direct contact information for care management team Advised patient to discuss her swelling of her lip and mouth with provider Discussed the purpose of each of her medications with her and a female family member Made her and the female family member aware that the long white pill that was started at [facility] is her Cholecalciferol 125 mcg Discuss Vasotec (enalapril)  is known to cause swelling Encouraged her to speak with her doctor Note sent to pcp & cardiology (in basket)         SDOH assessments and interventions completed:  No     Care Coordination Interventions Activated:  Yes  Care Coordination Interventions:  Yes, provided   Follow up plan: Follow up call scheduled for 04/02/22 3:30 pm    Encounter Outcome:  Pt. Visit Completed   Klyde Banka [name],  RN, BSN, [place] Coordinator Office number [phone]

## 2022-03-30 ENCOUNTER — Encounter: Payer: Self-pay | Admitting: *Deleted

## 2022-03-30 NOTE — Patient Instructions (Addendum)
Visit Information  Thank you for taking time to visit with me today. Please don't hesitate to contact me if I can be of assistance to you.   Following are the goals we discussed today:   Goals Addressed               This Visit's Progress     Patient Stated     manage & attend medical appointments [facility]) (pt-stated)   On track     Care Coordination Interventions: Barrier: unreliable medical transportation out of county by family or friends, use of a cane Evaluation of current treatment plan related to follow up office visits and patient's adherence to plan as established by provider Discussed plans with patient for ongoing care management follow up and provided patient with direct contact information for care management team Confirmed she has been to follow up pcp and cardiology visits        possible side effect from medicine [facility] ) (pt-stated)   Not on track     Care Coordination Interventions: Evaluation of current treatment plan related to swelling of mouth and lip and patient's adherence to plan as established by provider Reviewed medications with patient and discussed swelling side effects Discussed plans with patient for ongoing care management follow up and provided patient with direct contact information for care management team Advised patient to discuss her swelling of her lip and mouth with provider Discussed the purpose of each of her medications with her and a female family member Made her and the female family member aware that the long white pill that was started at [facility] is her Cholecalciferol 125 mcg Discuss Vasotec (enalapril)  is known to cause swelling Encouraged her to speak with her doctor Note sent to pcp & cardiology (in basket)         Our next appointment is by telephone on 04/02/22 at 3:30 pm  Please call the care guide team at [phone] if you need to cancel or reschedule your appointment.   If you are experiencing a Mental Health or [place] or need someone to talk to, please call the Suicide and Crisis Lifeline: 988 call the [place] National Suicide Prevention Lifeline: (432)564-0154 or TTY: 404 116 2736 TTY 8033514987) to talk to a trained counselor call 1-800-273-TALK (toll free, 24 hour hotline) call the [facility]: [phone] call 911   Patient verbalizes understanding of instructions and care plan provided today and agrees to view in [place]. Active MyChart status and patient understanding of how to access instructions and care plan via MyChart confirmed with patient.     The patient has been provided with contact information for the care management team and has been advised to call with any health related questions or concerns.   Alliya Marcon [name], RN, BSN, [place] Coordinator Office number [phone]

## 2022-04-02 ENCOUNTER — Ambulatory Visit: Payer: Self-pay | Admitting: *Deleted

## 2022-04-02 NOTE — Patient Instructions (Addendum)
Visit Information  Thank you for taking time to visit with me today. Please don't hesitate to contact me if I can be of assistance to you.   Following are the goals we discussed today:   Goals Addressed               This Visit's Progress     Patient Stated     manage & attend medical appointments [facility]) (pt-stated)   On track     Care Coordination Interventions: Barrier: unreliable medical transportation out of county by family or friends, use of a cane Collaborated with Dr Percival Spanish & Dr Lajuana Ripple regarding patient reported symptoms of swelling of lips, mouth and hands. Requested Advice  Reviewed scheduled/upcoming provider appointments including pcp on 04/17/22  Discussed plans with patient for ongoing care management follow up and provided patient with direct contact information for care management team         possible side effect from medicine [facility] ) (pt-stated)   On track     Care Coordination Interventions: Discussed plans with patient for ongoing care management follow up and provided patient with direct contact information for care management team Collaborated with Dr Percival Spanish & Dr Lajuana Ripple regarding patient reported symptoms of swelling of lips, mouth and hands. Requested Advice  Reviewed medication with patient's daughter and discussed that the cardiologist recommended the patient stop taking Vasotec (enalapril ) Spoke with Nedra when unable to reach the patient. Discussed the Cholecalciferol 125 MCG that was started upon discharge from [facility] and the side effect of Vasotec. Sent an e-mail to [facility] as requested with the brand & generic name of the medicine to remove from patient's pill pack  Outreach to [place] to speak with Diane about MDs order to discontinue Vasotec. She will take them out the pill packs to be delivered this weeks         Our next appointment is by telephone on 04/16/22 at 1100  Please call the care guide team at [phone] if you need  to cancel or reschedule your appointment.   If you are experiencing a Mental Health or [place] or need someone to talk to, please call the Suicide and Crisis Lifeline: 988 call the [place] National Suicide Prevention Lifeline: 901-091-0385 or TTY: 517-309-3007 TTY 559-158-7020) to talk to a trained counselor call 1-800-273-TALK (toll free, 24 hour hotline) call the [facility]: 747-449-8102 call 911   Patient verbalizes understanding of instructions and care plan provided today and agrees to view in [place]. Active MyChart status and patient understanding of how to access instructions and care plan via MyChart confirmed with patient.     The patient has been provided with contact information for the care management team and has been advised to call with any health related questions or concerns.   Jhene Westmoreland [name], RN, BSN, [place] Coordinator Office number [phone]

## 2022-04-02 NOTE — Patient Outreach (Signed)
  Care Coordination   Follow Up Visit Note   04/02/2022 Name: Erica Crosby MRN: [id number] DOB: [birth year]  Erica Crosby is a 76 y.o. year old female who sees [name], DO for primary care. I spoke with  Erica Crosby by phone today.  What matters to the patients health and wellness today?  Swelling of lips, hands and mouth. Providers consulted and agree patient needs to discontinue Vasotec.  THN Unsuccessful outreach Outreach attempt to the listed at the preferred outreach number in EPIC  No answer. No mailbox set unable to leave a HIPAA ([place]) compliant voicemail message along with CM's contact info.  Reached her daughter, Erica Crosby who also encouraged outreach to [place] as a delivery of pill packs are scheduled to arrive on this week Erica Crosby will updated the patient and assist with removal of Vasotec from her pill packs.    Goals Addressed               This Visit's Progress     Patient Stated     manage & attend medical appointments [facility]) (pt-stated)   On track     Care Coordination Interventions: Barrier: unreliable medical transportation out of county by family or friends, use of a cane Collaborated with Dr Percival Spanish & Dr Lajuana Ripple regarding patient reported symptoms of swelling of lips, mouth and hands. Requested Advice  Reviewed scheduled/upcoming provider appointments including pcp on 04/17/22  Discussed plans with patient for ongoing care management follow up and provided patient with direct contact information for care management team         possible side effect from medicine [facility] ) (pt-stated)   On track     Care Coordination Interventions: Discussed plans with patient for ongoing care management follow up and provided patient with direct contact information for care management team Collaborated with Dr Percival Spanish & Dr Lajuana Ripple regarding patient reported symptoms of swelling of lips, mouth and hands.  Requested Advice  Reviewed medication with patient's daughter and discussed that the cardiologist recommended the patient stop taking Vasotec (enalapril ) Spoke with Erica Crosby when unable to reach the patient. Discussed the Cholecalciferol 125 MCG that was started upon discharge from [facility] and the side effect of Vasotec. Sent an e-mail to [facility] as requested with the brand & generic name of the medicine to remove from patient's pill pack  Outreach to [place] to speak with Erica Crosby about MDs order to discontinue Vasotec. She will take them out the pill packs to be delivered this weeks         SDOH assessments and interventions completed:  No     Care Coordination Interventions Activated:  Yes  Care Coordination Interventions:  Yes, provided   Follow up plan: Follow up call scheduled for 04/16/22    Encounter Outcome:  Pt. Visit Completed   Randalyn Ahmed [name], RN, BSN, [place] Coordinator Office number [phone]

## 2022-04-03 ENCOUNTER — Telehealth: Payer: Self-pay

## 2022-04-03 ENCOUNTER — Other Ambulatory Visit: Payer: Self-pay | Admitting: Family Medicine

## 2022-04-03 DIAGNOSIS — E1122 Type 2 diabetes mellitus with diabetic chronic kidney disease: Secondary | ICD-10-CM

## 2022-04-03 MED ORDER — AMLODIPINE BESYLATE 2.5 MG PO TABS: 2.5000 mg | ORAL_TABLET | Freq: Every day | ORAL | 0 refills | Status: DC

## 2022-04-03 NOTE — Telephone Encounter (Signed)
na

## 2022-04-03 NOTE — Telephone Encounter (Signed)
-----   Message from [name], DO sent at 04/03/2022  2:16 PM EDT ----- Regarding: FW: Reports swelling of mouth & lips Norvasc 2.5mg  daily sent to pharmacy. Have her STOP enalapril due to possible angioedema.  Would like BP rechecked in 2-3 weeks (ok to schedule nurse visit for this).  I have cc'd her nephrologist and cardiologist as FYI   ----- Message ----- From: Barbaraann Faster, RN Sent: 04/02/2022  11:11 AM EDT To: [name], DO Subject: FW: Reports swelling of mouth & lips           Dr Alvera Singh this response from Dr Percival Spanish   Happy Monday!!!!  Joelene Millin [name], RN, BSN, [place] Coordinator Office number [phone]    ----- Message ----- From: Minus Breeding, MD Sent: 04/01/2022  11:27 AM EDT To: Barbaraann Faster, RN Subject: RE: Reports swelling of mouth & lips           She can stop the Vasotec and see if she gets better.   If not we will restart.  She should let me know in a couple of weeks if it is not better.  ----- Message ----- From: Barbaraann Faster, RN Sent: 03/30/2022   7:11 PM EDT To: Minus Breeding, MD; [name], DO Subject: Reports swelling of mouth & lips               Drs Lajuana Ripple & Hochrein  Just in case Erica Pinkhasov does not call one of you  It may resolve not sure.   03/29/22 Erica Crosby  Stated she was having Lip & mouth swelling with the inability to wear her dentures anymore  She reports discussing this lip swelling with her cardiologist on 03/28/22   Discussed Vasotec (enalapril)  is known to cause swelling with her & a female family member Encouraged her to speak with her doctor   Scheduled to outreach to her on 04/02/22   Joelene Millin [name], RN, BSN, [place] Coordinator Office number [phone]

## 2022-04-08 DIAGNOSIS — E1165 Type 2 diabetes mellitus with hyperglycemia: Secondary | ICD-10-CM | POA: Diagnosis not present

## 2022-04-16 ENCOUNTER — Ambulatory Visit: Payer: Self-pay | Admitting: *Deleted

## 2022-04-16 NOTE — Patient Outreach (Signed)
  Care Coordination   Follow Up Visit Note   04/16/2022 Name: Erica Crosby MRN: [id number] DOB: February 12, 1946  Erica Crosby is a 76 y.o. year old female who sees Erica Norlander, DO for primary care. I spoke with  Erica Crosby by phone today.  What matters to the patients health and wellness today?  "Having up and down. Stay tired a lot " Aware of her anemia  Has dentures she reports that are fitting better now   Son lives with her but goes to work at 0700.  He provides food for her to eat until he returns. Another son, her niece Erica Crosby or her daughter visits during the day She does not drive so the family schedule her medical appointments on days they can provided transportation   GI symptoms experienced recently Primarily nausea. She confirms she has a poor appetite but will take in diet ginger ale and yogurt.  She confirms she will be reminded to take foods with her medication     Goals Addressed               This Visit's Progress     Patient Stated     home medical alert device [facility]) (pt-stated)   Not on track     Care Coordination Interventions: Assessed for signs and symptoms of orthostatic hypotension Assessed for falls since last encounter Provided patient information for fall alert systems Assessed social determinant of health barriers Referral completed to [facility] care guide Email to daughter Erica Crosby to make her aware that a [facility] care guide may reach out to her       manage & attend medical appointments [facility]) (pt-stated)   On track     Care Coordination Interventions: Barrier: unreliable medical transportation out of county by family or friends, use of a cane Reviewed scheduled/upcoming provider appointments including nephrology appointment on 04/17/2310/14/23  Discussed plans with patient for ongoing care management follow up and provided patient with direct contact information for care management team         Manage nausea/other GI symptoms ([facility]) (pt-stated)    Not on track     Care Coordination Interventions: Evaluation of current treatment plan related to nausea and patient's adherence to plan as established by provider Discussed plans with patient for ongoing care management follow up and provided patient with direct contact information for care management team Confirmed she takes her Protonix as ordered and no nausea today but appetite still poor      COMPLETED: possible side effect from medicine [facility] ) (pt-stated)   On track     Care Coordination Interventions: Discussed plans with patient for ongoing care management follow up and provided patient with direct contact information for care management team Confirmed with patient that Vasotec has been discontinued, she has oral improvements and is able now to wear her dentures Goal completed         SDOH assessments and interventions completed:  Yes     Care Coordination Interventions Activated:  Yes  Care Coordination Interventions:  Yes, provided   Follow up plan: Follow up call scheduled for 04/30/22    Encounter Outcome:  Pt. Visit Completed   Erica Crosby [name], RN, BSN, [place] Coordinator Office number [phone]

## 2022-04-17 ENCOUNTER — Encounter: Payer: Self-pay | Admitting: Family Medicine

## 2022-04-17 ENCOUNTER — Other Ambulatory Visit: Payer: Self-pay | Admitting: Family Medicine

## 2022-04-17 ENCOUNTER — Ambulatory Visit (INDEPENDENT_AMBULATORY_CARE_PROVIDER_SITE_OTHER): Payer: Medicare Other | Admitting: Family Medicine

## 2022-04-17 VITALS — BP 139/69 | HR 71 | Temp 98.2°F | Ht 67.0 in | Wt 124.2 lb

## 2022-04-17 DIAGNOSIS — E11649 Type 2 diabetes mellitus with hypoglycemia without coma: Secondary | ICD-10-CM

## 2022-04-17 DIAGNOSIS — N184 Chronic kidney disease, stage 4 (severe): Secondary | ICD-10-CM | POA: Diagnosis not present

## 2022-04-17 DIAGNOSIS — E1122 Type 2 diabetes mellitus with diabetic chronic kidney disease: Secondary | ICD-10-CM

## 2022-04-17 DIAGNOSIS — Z23 Encounter for immunization: Secondary | ICD-10-CM

## 2022-04-17 DIAGNOSIS — E785 Hyperlipidemia, unspecified: Secondary | ICD-10-CM

## 2022-04-17 DIAGNOSIS — I129 Hypertensive chronic kidney disease with stage 1 through stage 4 chronic kidney disease, or unspecified chronic kidney disease: Secondary | ICD-10-CM

## 2022-04-17 LAB — BAYER DCA HB A1C WAIVED: HB A1C (BAYER DCA - WAIVED): 6.5 % — ABNORMAL HIGH (ref 4.8–5.6)

## 2022-04-17 NOTE — Progress Notes (Signed)
Subjective: CC:HTN PCP: [name], DO HPI:Erica Crosby is a 76 y.o. female presenting to clinic today for:  1. Type 2 Diabetes with hypertension, hyperlipidemia and CKD4:  Diabetes have been relatively controlled without medication.  She intermittently injects 5 units of her blood sugars are above 200s.  She does admit to a couple of hypoglycemic episodes where she measured sugars in the 60s and 70s.  She uses freestyle libre for continuous glucose monitoring.  This alerts her in the middle the night if it is low. Her enalapril was discontinued and she was placed on Norvasc instead.  She is compliant with this medication.  Her meds are managed by her daughter.  Last eye exam: Needs Last foot exam: Up-to-date Last A1c:  Lab Results  Component Value Date   HGBA1C 6.2 (H) 12/15/2021   Nephropathy screen indicated?:  Up to date Last flu, zoster and/or pneumovax:  Immunization History  Administered Date(s) Administered   Fluad Quad(high Dose 65+) 04/17/2022   Influenza Split 04/05/2010   Influenza, High Dose Seasonal PF 03/11/2018, 02/19/2019   Influenza,inj,Quad PF,6+ Mos 03/18/2016, 03/20/2017   Influenza-Unspecified 03/02/2021   Moderna Sars-Covid-2 Vaccination 07/24/2019, 08/21/2019, 05/11/2020   Pneumococcal Conjugate-13 04/21/2016   Pneumococcal Polysaccharide-23 04/05/2010, 08/14/2017, 01/03/2018   Tdap 11/05/2009   Zoster Recombinat (Shingrix) 01/03/2018, 03/11/2018     ROS: Per HPI  Allergies  Allergen Reactions   Iodinated Contrast Media     Unknown reaction    Latex     Unknown reaction    Shellfish Allergy     Unknown Reaction    Past Medical History:  Diagnosis Date   Anemia    Arthritis    Cataract    Coronary artery disease    Mild plaque 2012   Diabetes mellitus    Essential hypertension 02/10/2012   GERD (gastroesophageal reflux disease)    Hyperlipidemia    Hypertension    Neuropathy    Peptic ulcer    Rheumatoid arthritis ([place])     TIA (transient ischemic attack) 02/10/2012    Current Outpatient Medications:    Accu-Chek Softclix Lancets lancets, CHECK BLOOD SUGAR TWICE A DAY AND AS NEEDED Dx E11.21, Disp: 200 each, Rfl: 3   amLODipine (NORVASC) 2.5 MG tablet, Take 1 tablet (2.5 mg total) by mouth daily., Disp: 90 tablet, Rfl: 0   aspirin EC 81 MG tablet, Take 81 mg by mouth daily., Disp: , Rfl:    atorvastatin (LIPITOR) 10 MG tablet, Take 1 tablet (10 mg total) by mouth daily., Disp: 90 tablet, Rfl: 3   Blood Glucose Monitoring Suppl (ACCU-CHEK AVIVA PLUS) w/Device KIT, Test BS BID and prn Dx E11.21, Disp: 1 kit, Rfl: 0   carvedilol (COREG) 25 MG tablet, Take 25 mg by mouth 2 (two) times daily with a meal., Disp: , Rfl:    Cholecalciferol 125 MCG (5000 UT) TABS, Take 5,000 Units by mouth daily., Disp: , Rfl:    Continuous Blood Gluc Sensor ([name] 2 SENSOR) MISC, USE TO TEST BLOOD SUGAR CONTINUOUSLY. Dx: E11.65. ADVANCED DIABETES SUPPLY VIA [place], Disp: , Rfl:    fluticasone (FLONASE) 50 MCG/ACT nasal spray, Place 2 sprays into both nostrils daily., Disp: 16 g, Rfl: 6   folic acid (FOLVITE) 1 MG tablet, TAKE 1 TABLET DAILY, Disp: 90 tablet, Rfl: 1   glucose blood (ACCU-CHEK AVIVA PLUS) test strip, CHECK BLOOD SUGAR TWICE DAILY AND AS NEEDED, Disp: 200 strip, Rfl: 3   Omega-3 Fatty Acids (FISH OIL) 1000 MG CAPS,  Take 1,000 mg by mouth daily., Disp: , Rfl:    pantoprazole (PROTONIX) 40 MG tablet, TAKE 1 TABLET DAILY, Disp: 90 tablet, Rfl: 1   clobetasol (TEMOVATE) 0.05 % external solution, Apply 1 Application topically daily as needed (scalp irriration). (Patient not taking: Reported on 03/28/2022), Disp: 50 mL, Rfl: 1   insulin glargine (LANTUS SOLOSTAR) 100 UNIT/ML Solostar Pen, Inject 5-10 Units into the skin at bedtime. (Patient not taking: Reported on 03/28/2022), Disp: 15 mL, Rfl: 2   leflunomide (ARAVA) 20 MG tablet, Take 20 mg by mouth daily. (Patient not taking: Reported on 03/28/2022), Disp: , Rfl:   Social History   Socioeconomic History   Marital status: Widowed    Spouse name: Not on file   Number of children: 3   Years of education: 49   Highest education level: High school graduate  Occupational History   Occupation: retired  Tobacco Use   Smoking status: Former    Years: 25.00    Types: Cigarettes, E-cigarettes    Quit date: 02/02/2013    Years since quitting: 9.2   Smokeless tobacco: Never  Vaping Use   Vaping Use: Never used  Substance and Sexual Activity   Alcohol use: No   Drug use: No   Sexual activity: Not Currently    Birth control/protection: Post-menopausal  Other Topics Concern   Not on file  Social History Narrative   Daughter stays with her.    Son comes daily when daughter is working   Investment banker, operational of Radio broadcast assistant Strain: Medium Risk (03/08/2021)   Overall Financial Resource Strain (CARDIA)    Difficulty of Paying Living Expenses: Somewhat hard  Food Insecurity: Food Insecurity Present (03/08/2021)   Hunger Vital Sign    Worried About Running Out of Food in the Last Year: Sometimes true    Ran Out of Food in the Last Year: Never true  Transportation Needs: Unmet Transportation Needs (02/01/2022)   PRAPARE - Hydrologist (Medical): Yes    Lack of Transportation (Non-Medical): Yes  Physical Activity: Inactive (01/15/2022)   Exercise Vital Sign    Days of Exercise per Week: 0 days    Minutes of Exercise per Session: 0 min  Stress: Stress Concern Present (01/15/2022)   [place]    Feeling of Stress : To some extent  Social Connections: Socially Isolated (03/08/2021)   Social Connection and Isolation Panel [NHANES]    Frequency of Communication with Friends and Family: More than three times a week    Frequency of Social Gatherings with Friends and Family: Once a week    Attends Religious Services: Never    Marine scientist or  Organizations: No    Attends Archivist Meetings: Never    Marital Status: Widowed  Intimate Partner Violence: Not At Risk (02/01/2022)   Humiliation, Afraid, Rape, and Kick questionnaire    Fear of Current or Ex-Partner: No    Emotionally Abused: No    Physically Abused: No    Sexually Abused: No   Family History  Problem Relation Age of Onset   Heart disease Sister        Congeital (died age 39) with "enlarged heart"   CAD Sister    Diabetes Mother    Heart disease Mother        Later onset heart disease    Dementia Mother    Alcohol abuse Father  Liver disease Father    CAD Sister 47       CABG   Early death Sister    Diabetes Brother    Kidney disease Brother        related to DM   Diabetes Brother     Objective: Office vital signs reviewed. BP 139/69   Pulse 71   Temp 98.2 F (36.8 C)   Ht _0  (1.702 m)   Wt 124 lb 3.2 oz (56.3 kg)   SpO2 100%   BMI 19.45 kg/m   Physical Examination:  General: Awake, alert, well nourished, No acute distress HEENT: sclera white, MMM Cardio: regular rate and rhythm, S1S2 heard, no murmurs appreciated Pulm: clear to auscultation bilaterally, no wheezes, rhonchi or rales; normal work of breathing on room air    Assessment/ Plan: 76 y.o. 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  Okay to continue current regimen.  Stay off ARB.  A1c was obtained a little early and did show control of blood sugar.  Advised against overuse of insulin as it sounds like she is having some low sugars.  Discussed monitoring blood sugars closely and if blood pressure stays sustained over 250 okay to use 5 units of the Lantus.  Continue freestyle libre for continuous  blood sugar monitoring.  May follow-up in 4 to 6 months, sooner if concerns arise  Orders Placed This Encounter  Procedures   Flu Vaccine QUAD High Dose(Fluad)   Bayer DCA Hb A1c Waived   No orders of the defined types were placed in this encounter.    [name], DO [place] [phone]

## 2022-04-17 NOTE — Patient Instructions (Addendum)
It is NORMAL for sugar to go up after you eat.  If it stays above 250 after 2 hours, ok to use your insulin.  Otherwise, do NOT recommend use.  I think it might be bringing your sugar down too low.  The new blood pressure looks to be doing a good job.  Continue the Amlodipine and stay OFF the Enalapril

## 2022-04-18 NOTE — Progress Notes (Signed)
[facility] Quality Team Note  Name: Erica Crosby Date of Birth: 1946/01/05 MRN: [id number] Date: 04/18/2022  [facility] Quality Team has reviewed this patient's chart, please see recommendations below:  Diabetic Retinal Eye Exam; Patient requests [facility] Quality Coordinator to schedule Diabetic Retinal Screening at ([place] event 04/26/22, 1:30P).

## 2022-04-26 LAB — HM DIABETES EYE EXAM

## 2022-04-30 ENCOUNTER — Encounter: Payer: Self-pay | Admitting: *Deleted

## 2022-04-30 ENCOUNTER — Ambulatory Visit: Payer: Self-pay | Admitting: *Deleted

## 2022-04-30 ENCOUNTER — Telehealth: Payer: Self-pay

## 2022-04-30 DIAGNOSIS — N2889 Other specified disorders of kidney and ureter: Secondary | ICD-10-CM

## 2022-04-30 DIAGNOSIS — N183 Chronic kidney disease, stage 3 unspecified: Secondary | ICD-10-CM

## 2022-04-30 DIAGNOSIS — I340 Nonrheumatic mitral (valve) insufficiency: Secondary | ICD-10-CM

## 2022-04-30 DIAGNOSIS — N184 Chronic kidney disease, stage 4 (severe): Secondary | ICD-10-CM

## 2022-04-30 DIAGNOSIS — E1169 Type 2 diabetes mellitus with other specified complication: Secondary | ICD-10-CM

## 2022-04-30 NOTE — Telephone Encounter (Signed)
   Telephone encounter was:  Unsuccessful.  04/30/2022 Name: SHEYLI HORWITZ MRN: [id number] DOB: 1946/05/02  Unsuccessful outbound call made today to assist with:   medical alert system.  Outreach Attempt:  1st Attempt  Unable to leave message voicemail not setup.  [place] Resource Care Guide   ??[email or web address]  ?? [phone]   Website: [email or web address]  [email or web address]

## 2022-04-30 NOTE — Patient Instructions (Signed)
Visit Information  Thank you for taking time to visit with me today. Please don't hesitate to contact me if I can be of assistance to you.   Following are the goals we discussed today:   Goals Addressed   None     Our next appointment is by telephone on 05/29/22 at 1130  Please call the care guide team at [phone] if you need to cancel or reschedule your appointment.   If you are experiencing a Mental Health or [place] or need someone to talk to, please call the Suicide and Crisis Lifeline: 988 call the [place] National Suicide Prevention Lifeline: 804-335-2065 or TTY: (575)480-8674 TTY (223)817-0622) to talk to a trained counselor call 1-800-273-TALK (toll free, 24 hour hotline) call the [facility]: 641-839-3881 call 911   Patient verbalizes understanding of instructions and care plan provided today and agrees to view in [place]. Active MyChart status and patient understanding of how to access instructions and care plan via MyChart confirmed with patient.     The patient has been provided with contact information for the care management team and has been advised to call with any health related questions or concerns.   Loucinda Croy [name], RN, BSN, [place] Coordinator Office number [phone]

## 2022-04-30 NOTE — Patient Outreach (Signed)
  Care Coordination   Follow Up Visit Note   04/30/2022 Name: Erica Crosby MRN: [id number] DOB: August 09, 1945  Erica Crosby is a 76 y.o. year old female who sees [name], DO for primary care. I spoke with  [name] by phone today.  What matters to the patients health and wellness today? Patient speaking slowly today  She had a glaucoma testing 04/26/22 1330  Denies nausea but appetite remains fair   Cbg > 250 after eating x 2 < 90 for her in the mornings  Encouraged to keep candy or regular soda beside bed   To see her nephrologist on 05/01/22  Reports she needs a medical alarm  Answered questions for her about RING devices. Her sister has one and Mrs Panek daughter is attempting to get a device for the patient   Goals Addressed               This Visit's Progress     Patient Stated     home medical alert device [facility]) (pt-stated)        Care Coordination Interventions: Assessed for signs and symptoms of orthostatic hypotension Assessed for falls since last encounter Provided patient information for fall alert systems Assessed social determinant of health barriers Referral completed to [facility] care guide Email to daughter [name] to make her aware that a [facility] care guide may reach out to her       manage & attend medical appointments [facility]) (pt-stated)   On track     Care Coordination Interventions: Barrier: unreliable medical transportation out of county by family or friends, use of a cane Reviewed scheduled/upcoming provider appointments including nephrology appointment on 04/17/2310/14/23  Discussed plans with patient for ongoing care management follow up and provided patient with direct contact information for care management team         Manage nausea/other GI symptoms ([facility]) (pt-stated)   On track     Care Coordination Interventions: Evaluation of current treatment plan related to nausea and patient's adherence to plan as established by provider Discussed  plans with patient for ongoing care management follow up and provided patient with direct contact information for care management team Confirmed she takes her Protonix as ordered and no nausea today but appetite still poor        SDOH assessments and interventions completed:  Yes  SDOH Interventions Today    Flowsheet Row Most Recent Value  SDOH Interventions   Food Insecurity Interventions Intervention Not Indicated  Utilities Interventions Intervention Not Indicated  Alcohol Usage Interventions Intervention Not Indicated (Score <7)  Financial Strain Interventions Intervention Not Indicated        Care Coordination Interventions Activated:  Yes  Care Coordination Interventions:  Yes, provided   Follow up plan: Follow up call scheduled for 05/29/22    Encounter Outcome:  Pt. Visit Completed   Miklos Bidinger [name], RN, BSN, [place] Coordinator Office number [phone]

## 2022-05-01 DIAGNOSIS — R634 Abnormal weight loss: Secondary | ICD-10-CM | POA: Diagnosis not present

## 2022-05-01 DIAGNOSIS — M069 Rheumatoid arthritis, unspecified: Secondary | ICD-10-CM | POA: Diagnosis not present

## 2022-05-01 DIAGNOSIS — E1122 Type 2 diabetes mellitus with diabetic chronic kidney disease: Secondary | ICD-10-CM | POA: Diagnosis not present

## 2022-05-01 DIAGNOSIS — D631 Anemia in chronic kidney disease: Secondary | ICD-10-CM | POA: Diagnosis not present

## 2022-05-01 DIAGNOSIS — I129 Hypertensive chronic kidney disease with stage 1 through stage 4 chronic kidney disease, or unspecified chronic kidney disease: Secondary | ICD-10-CM | POA: Diagnosis not present

## 2022-05-01 DIAGNOSIS — N2889 Other specified disorders of kidney and ureter: Secondary | ICD-10-CM | POA: Diagnosis not present

## 2022-05-01 DIAGNOSIS — N179 Acute kidney failure, unspecified: Secondary | ICD-10-CM | POA: Diagnosis not present

## 2022-05-01 DIAGNOSIS — N183 Chronic kidney disease, stage 3 unspecified: Secondary | ICD-10-CM | POA: Diagnosis not present

## 2022-05-02 ENCOUNTER — Telehealth: Payer: Self-pay

## 2022-05-02 NOTE — Telephone Encounter (Signed)
   Telephone encounter was:  Successful.  05/02/2022 Name: Erica Crosby MRN: [id number] DOB: June 27, 1945  Erica Crosby is a 76 y.o. year old female who is a primary care patient of [name], DO . The community resource team was consulted for assistance with  medical alert system.  Care guide performed the following interventions: Spoke with patient and her daughter Erica Crosby about MedAlert offered through [facility]. Verified daughter's email address [email or web address] sent brochure. Erica Crosby has my contact information if she does not receive the email.  Follow Up Plan:  No further follow up planned at this time. The patient has been provided with needed resources.  [place] Resource Care Guide   [email or web address]  ?? [phone]   Website: [email or web address]  [email or web address]

## 2022-05-04 ENCOUNTER — Other Ambulatory Visit: Payer: Self-pay | Admitting: Family Medicine

## 2022-05-04 DIAGNOSIS — J309 Allergic rhinitis, unspecified: Secondary | ICD-10-CM

## 2022-05-09 DIAGNOSIS — E1165 Type 2 diabetes mellitus with hyperglycemia: Secondary | ICD-10-CM | POA: Diagnosis not present

## 2022-05-19 NOTE — Patient Instructions (Signed)
Visit Information  Thank you for taking time to visit with me today. Please don't hesitate to contact me if I can be of assistance to you.   Following are the goals we discussed today:   Goals Addressed               This Visit's Progress     Patient Stated     home medical alert device [facility]) (pt-stated)   Not on track     Care Coordination Interventions: Assessed for signs and symptoms of orthostatic hypotension Assessed for falls since last encounter Provided patient information for fall alert systems Assessed social determinant of health barriers Referral completed to [facility] care guide Email to daughter [name] to make her aware that a [facility] care guide may reach out to her       manage & attend medical appointments [facility]) (pt-stated)   On track     Care Coordination Interventions: Barrier: unreliable medical transportation out of county by family or friends, use of a cane Reviewed scheduled/upcoming provider appointments including nephrology appointment on 04/17/2310/14/23  Discussed plans with patient for ongoing care management follow up and provided patient with direct contact information for care management team         Manage nausea/other GI symptoms (THN) (pt-stated)   Not on track     Care Coordination Interventions: Evaluation of current treatment plan related to nausea and patient's adherence to plan as established by provider Discussed plans with patient for ongoing care management follow up and provided patient with direct contact information for care management team Confirmed she takes her Protonix as ordered and no nausea today but appetite still poor      COMPLETED: possible side effect from medicine [facility] ) (pt-stated)   On track     Care Coordination Interventions: Discussed plans with patient for ongoing care management follow up and provided patient with direct contact information for care management team Confirmed with patient that Vasotec has been discontinued,  she has oral improvements and is able now to wear her dentures Goal completed         Our next appointment is by telephone on 04/30/22 at 1100  Please call the care guide team at [phone] if you need to cancel or reschedule your appointment.   If you are experiencing a Mental Health or [place] or need someone to talk to, please call the Suicide and Crisis Lifeline: 988 call the [place] National Suicide Prevention Lifeline: 435 707 6838 or TTY: 220-073-6290 TTY 903-038-6235) to talk to a trained counselor call 1-800-273-TALK (toll free, 24 hour hotline) call the [facility]: 901-062-0323 call 911   Patient verbalizes understanding of instructions and care plan provided today and agrees to view in [place]. Active MyChart status and patient understanding of how to access instructions and care plan via MyChart confirmed with patient.     The patient has been provided with contact information for the care management team and has been advised to call with any health related questions or concerns.   Windel Keziah [name], RN, BSN, [place] Coordinator Office number [phone]

## 2022-05-28 NOTE — Progress Notes (Signed)
[facility] Quality Team Note  Name: Erica Crosby Date of Birth: [birth year] MRN: [id number] Date: 05/28/2022  [facility] Quality Team has reviewed this patient's chart, please see recommendations below:  [facility] Quality Other; (KED: Kidney Health Evaluation Gap- Patient needs Urine Albumin Creatinine Ratio Test completed for gap closure. EGFR has already been completed).

## 2022-05-29 ENCOUNTER — Ambulatory Visit: Payer: Self-pay | Admitting: *Deleted

## 2022-05-29 DIAGNOSIS — E1122 Type 2 diabetes mellitus with diabetic chronic kidney disease: Secondary | ICD-10-CM | POA: Diagnosis not present

## 2022-05-29 DIAGNOSIS — N183 Chronic kidney disease, stage 3 unspecified: Secondary | ICD-10-CM | POA: Diagnosis not present

## 2022-05-29 DIAGNOSIS — D631 Anemia in chronic kidney disease: Secondary | ICD-10-CM | POA: Diagnosis not present

## 2022-05-29 DIAGNOSIS — N179 Acute kidney failure, unspecified: Secondary | ICD-10-CM | POA: Diagnosis not present

## 2022-05-29 DIAGNOSIS — I129 Hypertensive chronic kidney disease with stage 1 through stage 4 chronic kidney disease, or unspecified chronic kidney disease: Secondary | ICD-10-CM | POA: Diagnosis not present

## 2022-05-29 DIAGNOSIS — R634 Abnormal weight loss: Secondary | ICD-10-CM | POA: Diagnosis not present

## 2022-05-29 DIAGNOSIS — N2889 Other specified disorders of kidney and ureter: Secondary | ICD-10-CM | POA: Diagnosis not present

## 2022-05-29 DIAGNOSIS — M069 Rheumatoid arthritis, unspecified: Secondary | ICD-10-CM | POA: Diagnosis not present

## 2022-05-29 NOTE — Patient Outreach (Addendum)
  Care Coordination   Follow Up Visit Note   05/29/2022 Name: OTHA Crosby MRN: [id number] DOB: 04-25-1946  Erica Crosby is a 76 y.o. year old female who sees [name], DO for primary care. I spoke with  [name] by phone today.  What matters to the patients health and wellness today?  Nephrology follow up 05/29/22 Medical alert - not received yet pending daughter Erica Crosby assistance  Eye exam- still needed   Nose bleeds recent nose bleed took a long time to stop  This was not the first time this has occurred Headache with nose bleeds -use tylenol only  with relief She reports nose bleeds only occur when her nasal passage becomes dry Has a humidifier & nasal saline  Confirm her use of Aspirin   Goals Addressed               This Visit's Progress     Patient Stated     home medical alert device [facility]) (pt-stated)   Not on track     Care Coordination Interventions: Assessed for signs and symptoms of orthostatic hypotension Assessed for falls since last encounter Provided patient information for fall alert systems Assessed social determinant of health barriers [facility] care guide completed providing resources on 05/02/22 to patient daughter, Erica Crosby Confirmed purchase of Medical alert device still pending       manage & attend medical appointments [facility]) (pt-stated)   On track     Care Coordination Interventions: Barrier: unreliable medical transportation out of county by family or friends, use of a cane Discussed plans with patient for ongoing care management follow up and provided patient with direct contact information for care management team Confirmed her 04/17/22 Nephrology was rescheduled to 05/29/22 and she is on her way to the appointment with her daughter, [name] a list of eye doctors via e-mail to [facility] to assist with an eye exam         Manage Nose bleeds (THN) (pt-stated)   Not on track     Care Coordination Interventions: Counseled on avoidance  of NSAIDs due to increased bleeding risk Discussed the causes of nose bleeds, infection, injury, allergic reaction, nose picking, object in the nostril Assessed for use of medications that may extend bleeding  Confirmed the use of aspirin and educated her on possible need for adjustment with advise of MD Education sent via my chart on nose bleeds and treatment  Evaluated for use of nasal saline spray, humidifier Outreach to pcp/cardiologist to update her on the nose bleeds Confirms pcp aware of nose bleeds related to allergic sinus inflammation due to allergies       COMPLETED: possible side effect from medicine [facility] ) (pt-stated)        Care Coordination Interventions: Discussed plans with patient for ongoing care management follow up and provided patient with direct contact information for care management team Confirmed with patient that Vasotec has been discontinued, she has oral improvements and is able now to wear her dentures Goal completed         SDOH assessments and interventions completed:  No     Care Coordination Interventions:  Yes, provided   Follow up plan: Follow up call scheduled for 06/29/22    Encounter Outcome:  Pt. Visit Completed   [name] [name], RN, BSN, [place] Coordinator Office number [phone]

## 2022-05-29 NOTE — Patient Instructions (Addendum)
Visit Information  Thank you for taking time to visit with me today. Please don't hesitate to contact me if I can be of assistance to you.   Following are the goals we discussed today:   Goals Addressed               This Visit's Progress     Patient Stated     home medical alert device [facility]) (pt-stated)   Not on track     Care Coordination Interventions: Assessed for signs and symptoms of orthostatic hypotension Assessed for falls since last encounter Provided patient information for fall alert systems Assessed social determinant of health barriers [facility] care guide completed providing resources on 05/02/22 to patient daughter, [name] Confirmed purchase of Medical alert device still pending       manage & attend medical appointments [facility]) (pt-stated)   On track     Care Coordination Interventions: Barrier: unreliable medical transportation out of county by family or friends, use of a cane Discussed plans with patient for ongoing care management follow up and provided patient with direct contact information for care management team Confirmed her 04/17/22 Nephrology was rescheduled to 05/29/22 and she is on her way to the appointment with her daughter, [name] a list of eye doctors via e-mail to [facility] to assist with an eye exam         Manage Nose bleeds (THN) (pt-stated)   Not on track     Care Coordination Interventions: Counseled on avoidance of NSAIDs due to increased bleeding risk Discussed the causes of nose bleeds, infection, injury, allergic reaction, nose picking, object in the nostril Assessed for use of medications that may extend bleeding  Confirmed the use of aspirin and educated her on possible need for adjustment with advise of MD Education sent via my chart on nose bleeds and treatment  Evaluated for use of nasal saline spray, humidifier Outreach to pcp/cardiologist to update her on the nose bleeds Confirms pcp aware of nose bleeds related to allergic sinus  inflammation due to allergies       COMPLETED: possible side effect from medicine  [facility] ) (pt-stated)        Care Coordination Interventions: Discussed plans with patient for ongoing care management follow up and provided patient with direct contact information for care management team Confirmed with patient that Vasotec has been discontinued, she has oral improvements and is able now to wear her dentures Goal completed         Our next appointment is by telephone on 06/29/2022 at 1100  Please call the care guide team at [phone] if you need to cancel or reschedule your appointment.   If you are experiencing a Mental Health or [place] or need someone to talk to, please call the Suicide and Crisis Lifeline: 988 call the [place] National Suicide Prevention Lifeline: 763-194-8960 or TTY: 559-228-9831 TTY 949-729-7107) to talk to a trained counselor call 1-800-273-TALK (toll free, 24 hour hotline) call the [facility]: [phone] call 911   Patient verbalizes understanding of instructions and care plan provided today and agrees to view in [place]. Active MyChart status and patient understanding of how to access instructions and care plan via MyChart confirmed with patient.     The patient has been provided with contact information for the care management team and has been advised to call with any health related questions or concerns.   Sergey Ishler [name], RN, BSN, [place] Coordinator Office number [phone]

## 2022-06-04 ENCOUNTER — Other Ambulatory Visit (HOSPITAL_COMMUNITY): Payer: Self-pay

## 2022-06-12 DIAGNOSIS — E1165 Type 2 diabetes mellitus with hyperglycemia: Secondary | ICD-10-CM | POA: Diagnosis not present

## 2022-06-14 ENCOUNTER — Other Ambulatory Visit: Payer: Self-pay

## 2022-06-14 ENCOUNTER — Encounter (HOSPITAL_COMMUNITY): Payer: Self-pay | Admitting: Nephrology

## 2022-06-14 DIAGNOSIS — D631 Anemia in chronic kidney disease: Secondary | ICD-10-CM | POA: Insufficient documentation

## 2022-06-14 DIAGNOSIS — N186 End stage renal disease: Secondary | ICD-10-CM | POA: Insufficient documentation

## 2022-06-19 ENCOUNTER — Encounter (HOSPITAL_COMMUNITY)
Admission: RE | Admit: 2022-06-19 | Discharge: 2022-06-19 | Disposition: A | Discharge status: Home / Self Care | Payer: Medicare Other | Source: Ambulatory Visit | Attending: Nephrology | Admitting: Nephrology

## 2022-06-19 VITALS — BP 156/54 | HR 66 | Temp 97.6°F | Resp 16

## 2022-06-19 DIAGNOSIS — N184 Chronic kidney disease, stage 4 (severe): Secondary | ICD-10-CM | POA: Insufficient documentation

## 2022-06-19 DIAGNOSIS — D631 Anemia in chronic kidney disease: Secondary | ICD-10-CM | POA: Insufficient documentation

## 2022-06-19 LAB — RENAL FUNCTION PANEL
Albumin: 3 g/dL — ABNORMAL LOW (ref 3.5–5.0)
Anion gap: 7 (ref 5–15)
BUN: 33 mg/dL — ABNORMAL HIGH (ref 8–23)
CO2: 23 mmol/L (ref 22–32)
Calcium: 8.3 mg/dL — ABNORMAL LOW (ref 8.9–10.3)
Chloride: 105 mmol/L (ref 98–111)
Creatinine, Ser: 2.49 mg/dL — ABNORMAL HIGH (ref 0.44–1.00)
GFR, Estimated: 20 mL/min — ABNORMAL LOW (ref >60)
Glucose, Bld: 214 mg/dL — ABNORMAL HIGH (ref 70–99)
Phosphorus: 4 mg/dL (ref 2.5–4.6)
Potassium: 4.2 mmol/L (ref 3.5–5.1)
Sodium: 135 mmol/L (ref 135–145)

## 2022-06-19 LAB — IRON AND TIBC
Iron: 33 ug/dL (ref 28–170)
Saturation Ratios: 15 % (ref 10.4–31.8)
TIBC: 215 ug/dL — ABNORMAL LOW (ref 250–450)
UIBC: 182 ug/dL

## 2022-06-19 LAB — FERRITIN: Ferritin: 208 ng/mL (ref 11–307)

## 2022-06-19 LAB — POCT HEMOGLOBIN-HEMACUE: Hemoglobin: 6.8 g/dL — CL (ref 12.0–15.0)

## 2022-06-19 MED ORDER — SODIUM CHLORIDE 0.9 % IV SOLN
510.0000 mg | Freq: Once | INTRAVENOUS | Status: AC
  Administered 2022-06-19: 510 mg via INTRAVENOUS
  Filled 2022-06-19: qty 510

## 2022-06-19 MED ORDER — EPOETIN ALFA-EPBX 10000 UNIT/ML IJ SOLN: 20000.0000 [IU] | Freq: Once | INTRAMUSCULAR | Status: DC

## 2022-06-19 MED ORDER — DIPHENHYDRAMINE HCL 25 MG PO CAPS
25.0000 mg | ORAL_CAPSULE | Freq: Once | ORAL | Status: AC
  Administered 2022-06-19: 25 mg via ORAL
  Filled 2022-06-19: qty 1

## 2022-06-19 MED ORDER — EPOETIN ALFA-EPBX 20000 UNIT/ML IJ SOLN
20000.0000 [IU] | Freq: Once | INTRAMUSCULAR | Status: AC
  Administered 2022-06-19: 20000 [IU] via SUBCUTANEOUS

## 2022-06-19 MED ORDER — CLONIDINE HCL 0.1 MG PO TABS: 0.1000 mg | ORAL_TABLET | ORAL | Status: DC | PRN

## 2022-06-19 MED ORDER — ACETAMINOPHEN 325 MG PO TABS
650.0000 mg | ORAL_TABLET | Freq: Once | ORAL | Status: AC
  Administered 2022-06-19: 650 mg via ORAL
  Filled 2022-06-19: qty 2

## 2022-06-19 NOTE — Progress Notes (Signed)
Diagnosis: Anemia in Chronic Kidney Disease  Provider:  Corliss Parish MD  Procedure: Infusion  IV Type: Peripheral, IV Location: L Antecubital  Feraheme (Ferumoxytol), Dose: 510 mg  Infusion Start Time: 7445  Infusion Stop Time: 1510  Post Infusion IV Care: Patient declined observation and Peripheral IV Discontinued  Discharge: Condition: Good, Destination: Home . AVS provided to patient.   Performed by:  Baxter Hire, RN      HGB 6.8  Patient received Retacrit 20,000 units in left lower abdomen per MD order.

## 2022-06-20 LAB — PTH, INTACT AND CALCIUM
Calcium, Total (PTH): 8.7 mg/dL (ref 8.7–10.3)
PTH: 45 pg/mL (ref 15–65)

## 2022-06-24 NOTE — Progress Notes (Unsigned)
3    Cardiology Office Note   Date:  06/27/2022   ID:  Moxie, Kalil 1945-08-31, MRN [id number]  PCP:  [name], DO  Cardiologist:   Minus Breeding, MD Referring:  [name], DO  Chief Complaint  Patient presents with   Mitral Regurgitation      History of Present Illness: Erica Crosby is a 77 y.o. female who presents for evaluation of mitral regurgitation.   Echo in 2020 when she was hospitalized at [facility] demonstrated moderate mitral regurgitation.   This was identified at the time of hospitalization for respiratory failure and COVID.  She was seen at [facility] in August 2023 and her proBNP was elevated in the emergency room but chest x-ray was clear.  Troponin was very mildly elevated at 37.   She had significant anemia and elevated creatinine which they thought were probably baseline for her. She was transferred to [facility].   She was found to have on echocardiography well-preserved ejection fraction.  However, she had severe mitral regurgitation.  This was a transthoracic echocardiogram.   She had some trivial pericardial effusion and left pleural effusion.  It was suggested that she might need perfusion imaging in the future but it was not felt that she was having an acute coronary event.  She had acute on chronic renal insufficiency with a creatinine was 3.23.  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  She has been following with Dr. Louie Boston with nephrology.  She is getting iron infusions but as below she is progressively more anemic.  Her daughter did bring her today which is helpful.  She apparently lives with his son who is not very helpful.  It does not sound like her diet is very good.  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  The patient gets angry about this and says that is not the case.  She did have a fall getting out of bed yesterday.  She did not lose consciousness.  She has apparently been weak.   She walks with a cane.  She is not describing new shortness of breath, PND or orthopnea.  She has not had any new chest pressure, neck or arm discomfort.  She has had some weight loss because she is not eating very well apparently.   Past Medical History:  Diagnosis Date   Anemia    Arthritis    Cataract    Coronary artery disease    Mild plaque 2012   Diabetes mellitus    Essential hypertension 02/10/2012   GERD (gastroesophageal reflux disease)    Hyperlipidemia    Hypertension    Neuropathy    Peptic ulcer    Rheumatoid arthritis ([place])    TIA (transient ischemic attack) 02/10/2012    Past Surgical History:  Procedure Laterality Date   ABSCESS DRAINAGE     CARPAL TUNNEL RELEASE Left    CHOLECYSTECTOMY     KNEE SURGERY Right    NECK SURGERY     x 2   SHOULDER SURGERY Left    TEE WITHOUT CARDIOVERSION N/A 03/12/2022   Procedure: TRANSESOPHAGEAL ECHOCARDIOGRAM (TEE);  Surgeon: Sanda Klein, MD;  Location: [facility] END[facility];  Service: Cardiovascular;  Laterality: N/A;     Current Outpatient Medications  Medication Sig Dispense Refill   amLODipine (NORVASC) 2.5 MG tablet Take 1 tablet (2.5 mg total) by mouth daily. 90 tablet 0   aspirin EC 81 MG tablet Take 81  mg by mouth daily.     atorvastatin (LIPITOR) 10 MG tablet TAKE 1 TABLET DAILY 90 tablet 1   Cholecalciferol 125 MCG (5000 UT) TABS Take 5,000 Units by mouth daily.     clobetasol (TEMOVATE) 0.05 % external solution Apply 1 Application topically daily as needed (scalp irriration). 50 mL 1   fluticasone (FLONASE) 50 MCG/ACT nasal spray USE 2 SPRAYS IN EACH NOSTRIL ONCE DAILY 16 g 6   folic acid (FOLVITE) 1 MG tablet TAKE 1 TABLET DAILY 90 tablet 1   insulin glargine (LANTUS SOLOSTAR) 100 UNIT/ML Solostar Pen Inject 5-10 Units into the skin at bedtime. 15 mL 2   Omega-3 Fatty Acids (FISH OIL) 1000 MG CAPS Take 1,000 mg by mouth daily.     pantoprazole (PROTONIX) 40 MG tablet TAKE 1 TABLET DAILY 90 tablet 1   Accu-Chek  Softclix Lancets lancets CHECK BLOOD SUGAR TWICE A DAY AND AS NEEDED Dx E11.21 200 each 3   Blood Glucose Monitoring Suppl (ACCU-CHEK AVIVA PLUS) w/Device KIT Test BS BID and prn Dx E11.21 1 kit 0   Continuous Blood Gluc Sensor ([name] 2 SENSOR) MISC USE TO TEST BLOOD SUGAR CONTINUOUSLY. Dx: E11.65. ADVANCED DIABETES SUPPLY VIA [place]     glucose blood (ACCU-CHEK AVIVA PLUS) test strip CHECK BLOOD SUGAR TWICE DAILY AND AS NEEDED 200 strip 3   No current facility-administered medications for this visit.    Allergies:   Carvedilol, Iodinated contrast media, Latex, and Shellfish allergy    ROS:  Please see the history of present illness.   Otherwise, review of systems are positive for none.   All other systems are reviewed and negative.    PHYSICAL EXAM: VS:  BP 130/64   Pulse 84  , BMI There is no height or weight on file to calculate BMI. GENERAL:  Well appearing NECK:  No jugular venous distention, waveform within normal limits, carotid upstroke brisk and symmetric, no bruits, no thyromegaly LUNGS:  Clear to auscultation bilaterally CHEST:  Unremarkable HEART:  PMI not displaced or sustained,S1 and S2 within normal limits, no S3, no S4, no clicks, no rubs, 3 out of 6 holosystolic murmur heard throughout the entire anterior precordium, no diastolic murmurs ABD:  Flat, positive bowel sounds normal in frequency in pitch, no bruits, no rebound, no guarding, no midline pulsatile mass, no hepatomegaly, no splenomegaly EXT:  2 plus pulses throughout, no edema, no cyanosis no clubbing   TEE:    03/12/2022    1. Left ventricular ejection fraction, by estimation, is 60 to 65%. The  left ventricle has normal function.   2. Right ventricular systolic function is normal. The right ventricular  size is normal. There is normal pulmonary artery systolic pressure. The  estimated right ventricular systolic pressure is 62.9 mmHg.   3. Left atrial size was moderately dilated. No left  atrial/left atrial  appendage thrombus was detected.   4. There is mild mitral valve chordal shortening and leaflet tethering,  with very mild mitral stenosis. By the PISA method, the effective  regurgitant orifice area is 0.3 cm sq, regurgitant volume 65 ml,  regurgitant fraction 50%. Both right and left  pulmonary veins show normal systolic antegrade flow (equalized systolic  and diastolic components). The mitral valve is rheumatic. Moderate to  severe mitral valve regurgitation. The mean mitral valve gradient is 4.0  mmHg with average heart rate of 60 bpm.   5. The aortic valve is tricuspid. Aortic valve regurgitation is not  visualized. No aortic  stenosis is present.   6. There is mild (Grade II) protruding plaque involving the aortic arch.     EKG:  EKG is not ordered today.   Recent Labs: 02/23/2022: Platelets 236 06/19/2022: BUN 33; Creatinine, Ser 2.49; Hemoglobin 6.8; Potassium 4.2; Sodium 135    Lipid Panel    Component Value Date/Time   CHOL 141 10/14/2020 0950   TRIG 87 10/14/2020 0950   HDL 47 10/14/2020 0950   CHOLHDL 3.0 10/14/2020 0950   CHOLHDL 3.3 02/10/2012 0520   VLDL 17 02/10/2012 0520   LDLCALC 77 10/14/2020 0950      Wt Readings from Last 3 Encounters:  04/17/22 124 lb 3.2 oz (56.3 kg)  03/28/22 125 lb (56.7 kg)  03/12/22 130 lb 1.1 oz (59 kg)      Other studies Reviewed: Additional studies/ records that were reviewed today include: Labs Review of the above records demonstrates:  Please see elsewhere in the note.     ASSESSMENT AND PLAN:  MR:    The MR is moderate to moderately severe.  I was able to now have a discussion with the patient and her daughter.  She absolutely is not a candidate for any valve procedure at present given his severe anemia and her frail condition, poor social situation and renal insufficiency.  In fact the patient does not think she ever consent to any procedures that might worsen her renal function and lead to dialysis.   She does consent to letting me repeat an echo to see if her ejection fraction is still preserved.  Of note most recently the regurgitant volume is greater than 60.  The regurgitant fraction is greater than 50.  However, other measures suggest moderate not severe.  She has normal left ventricular function.  Per guidelines I will follow closely her ejection fraction which would be the trigger for valve replacement in this situation.  LBBB:   This has been chronic.   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   Kidney lesion: 2.8 x 2.5 cm high attenuation lesion incidentally found on the left kidney.  I spoke with [name], DO.  The patient was a no-show for an MRI in the fall.  That has not been reordered.   Ronnie Doss M, DO is also going to call and catch up with Dr. Moshe Cipro to discuss any next steps with her anemia and renal insufficiency.   Anemia: Hemoglobin was 6.8.  She did get a dose of Feraheme yesterday.  Plan as above.  Elevated troponin:    She is not having any chest discomfort.  She did have this when she was hospitalized.  She be high risk for any invasive evaluation.  No change in therapy.   Diabetes mellitus:   A1c was 6.5.  No change in therapy.   Dyslipidemia: LDL was actually 77 with an HDL of 44.  No change in therapy.    Current medicines are reviewed at length with the patient today.  The patient does not have concerns regarding medicines.  The following changes have been made:  None  Labs/ tests ordered today include:   Orders Placed This Encounter  Procedures   ECHOCARDIOGRAM COMPLETE     Disposition:   FU with me in 4 months.  Ronnell Guadalajara, MD  06/27/2022 12:46 PM    [place]

## 2022-06-26 ENCOUNTER — Encounter (HOSPITAL_COMMUNITY)
Admission: RE | Admit: 2022-06-26 | Discharge: 2022-06-26 | Disposition: A | Discharge status: Home / Self Care | Payer: Medicare Other | Source: Ambulatory Visit | Attending: Nephrology | Admitting: Nephrology

## 2022-06-26 VITALS — BP 164/73 | HR 77 | Temp 98.5°F | Resp 16

## 2022-06-26 DIAGNOSIS — D631 Anemia in chronic kidney disease: Secondary | ICD-10-CM | POA: Diagnosis not present

## 2022-06-26 DIAGNOSIS — N184 Chronic kidney disease, stage 4 (severe): Secondary | ICD-10-CM | POA: Diagnosis not present

## 2022-06-26 MED ORDER — ACETAMINOPHEN 325 MG PO TABS
650.0000 mg | ORAL_TABLET | Freq: Once | ORAL | Status: AC
  Administered 2022-06-26: 650 mg via ORAL
  Filled 2022-06-26: qty 2

## 2022-06-26 MED ORDER — SODIUM CHLORIDE 0.9 % IV SOLN
510.0000 mg | Freq: Once | INTRAVENOUS | Status: AC
  Administered 2022-06-26: 510 mg via INTRAVENOUS
  Filled 2022-06-26: qty 510

## 2022-06-26 MED ORDER — DIPHENHYDRAMINE HCL 25 MG PO CAPS
25.0000 mg | ORAL_CAPSULE | Freq: Once | ORAL | Status: AC
  Administered 2022-06-26: 25 mg via ORAL
  Filled 2022-06-26: qty 1

## 2022-06-26 NOTE — Progress Notes (Signed)
Diagnosis: Anemia in Chronic Kidney Disease  Provider:  Corliss Parish MD  Procedure: Infusion  IV Type: Peripheral, IV Location: R Antecubital  Feraheme (Ferumoxytol), Dose: 510 mg  Infusion Start Time: 8719  Infusion Stop Time: 1408  Post Infusion IV Care: Observation period completed and Peripheral IV Discontinued  Discharge: Condition: Good, Destination: Home . AVS provided to patient.   Performed by:  Hughie Closs, RN

## 2022-06-26 NOTE — Addendum Note (Signed)
Encounter addended by: Baxter Hire, RN on: 06/26/2022 2:33 PM  Actions taken: Therapy plan modified

## 2022-06-27 ENCOUNTER — Encounter: Payer: Self-pay | Admitting: Cardiology

## 2022-06-27 ENCOUNTER — Ambulatory Visit: Payer: Medicare Other | Admitting: Cardiology

## 2022-06-27 VITALS — BP 130/64 | HR 84

## 2022-06-27 DIAGNOSIS — E118 Type 2 diabetes mellitus with unspecified complications: Secondary | ICD-10-CM

## 2022-06-27 DIAGNOSIS — N184 Chronic kidney disease, stage 4 (severe): Secondary | ICD-10-CM

## 2022-06-27 DIAGNOSIS — I340 Nonrheumatic mitral (valve) insufficiency: Secondary | ICD-10-CM | POA: Diagnosis not present

## 2022-06-27 DIAGNOSIS — E785 Hyperlipidemia, unspecified: Secondary | ICD-10-CM

## 2022-06-27 NOTE — Patient Instructions (Signed)
Medication Instructions:  The current medical regimen is effective;  continue present plan and medications.  *If you need a refill on your cardiac medications before your next appointment, please call your pharmacy*  Testing/Procedures: Your physician has requested that you have an echocardiogram. Echocardiography is a painless test that uses sound waves to create images of your heart. It provides your doctor with information about the size and shape of your heart and how well your heart's chambers and valves are working. This procedure takes approximately one hour. There are no restrictions for this procedure. Please do NOT wear cologne, perfume, aftershave, or lotions (deodorant is allowed). Please arrive 15 minutes prior to your appointment time. You will be contacted to be scheduled for this testing.  Follow-Up: At [facility], you and your health needs are our priority.  As part of our continuing mission to provide you with exceptional heart care, we have created designated Provider Care Teams.  These Care Teams include your primary Cardiologist (physician) and Advanced Practice Providers (APPs -  Physician Assistants and Nurse Practitioners) who all work together to provide you with the care you need, when you need it.  We recommend signing up for the patient portal called "MyChart".  Sign up information is provided on this After Visit Summary.  MyChart is used to connect with patients for Virtual Visits (Telemedicine).  Patients are able to view lab/test results, encounter notes, upcoming appointments, etc.  Non-urgent messages can be sent to your provider as well.   To learn more about what you can do with MyChart, go to [email or web address].    Your next appointment:   3 month(s)  The format for your next appointment:   In Person  Provider:   Minus Breeding, MD     Important Information About Sugar

## 2022-06-29 ENCOUNTER — Ambulatory Visit: Payer: Self-pay

## 2022-06-29 ENCOUNTER — Other Ambulatory Visit: Payer: Self-pay

## 2022-06-29 NOTE — Patient Outreach (Signed)
  Care Coordination   06/29/2022 Name: Erica Crosby MRN: [id number] DOB: 30-May-1946   Care Coordination Outreach Attempts:  An unsuccessful telephone outreach was attempted today to offer the patient information about available care coordination services as a benefit of their health plan.   Follow Up Plan:  Additional outreach attempts will be made to offer the patient care coordination information and services.   Encounter Outcome:  No Answer   Care Coordination Interventions:  No, not indicated    Jone Baseman, RN, MSN [place] Management Care Management Coordinator Direct Line [phone]

## 2022-07-02 ENCOUNTER — Ambulatory Visit: Payer: Medicare Other | Admitting: Family Medicine

## 2022-07-03 ENCOUNTER — Encounter (HOSPITAL_COMMUNITY): Admission: RE | Admit: 2022-07-03 | Payer: Medicare Other | Source: Ambulatory Visit

## 2022-07-04 ENCOUNTER — Telehealth: Payer: Self-pay | Admitting: *Deleted

## 2022-07-04 NOTE — Progress Notes (Signed)
  Care Coordination Note  07/04/2022 Name: Erica Crosby MRN: [id number] DOB: 1945-10-15  Erica Crosby is a 77 y.o. year old female who is a primary care patient of [name], DO and is actively engaged with the care management team. I reached out to [name] by phone today to assist with re-scheduling a follow up visit with the RN Case Manager  Follow up plan: Unsuccessful telephone outreach attempt made.   [place]  Direct Dial: [phone]

## 2022-07-05 ENCOUNTER — Encounter (HOSPITAL_COMMUNITY)
Admission: RE | Admit: 2022-07-05 | Discharge: 2022-07-05 | Disposition: A | Discharge status: Home / Self Care | Payer: Medicare Other | Source: Ambulatory Visit | Attending: Nephrology | Admitting: Nephrology

## 2022-07-05 VITALS — BP 156/63 | HR 80 | Temp 98.5°F | Resp 17

## 2022-07-05 DIAGNOSIS — D631 Anemia in chronic kidney disease: Secondary | ICD-10-CM | POA: Diagnosis not present

## 2022-07-05 DIAGNOSIS — N184 Chronic kidney disease, stage 4 (severe): Secondary | ICD-10-CM | POA: Diagnosis not present

## 2022-07-05 LAB — POCT HEMOGLOBIN-HEMACUE: Hemoglobin: 7.5 g/dL — ABNORMAL LOW (ref 12.0–15.0)

## 2022-07-05 MED ORDER — CLONIDINE HCL 0.1 MG PO TABS: 0.1000 mg | ORAL_TABLET | ORAL | Status: DC | PRN

## 2022-07-05 MED ORDER — EPOETIN ALFA-EPBX 10000 UNIT/ML IJ SOLN
20000.0000 [IU] | Freq: Once | INTRAMUSCULAR | Status: AC
  Administered 2022-07-05: 20000 [IU] via SUBCUTANEOUS

## 2022-07-05 NOTE — Progress Notes (Signed)
Diagnosis: Anemia in Chronic Kidney Disease  Provider:  Corliss Parish MD  Procedure: Injection  Retacrit (epoetin alfa-epbx), Dose: 20000 Units, Site: subcutaneous, Number of injections: 1  HGB 7.5  Post Care: Patient declined observation  Discharge: Condition: Good, Destination: Home . AVS provided to patient.   Performed by:  Grayland Ormond, RN

## 2022-07-06 ENCOUNTER — Ambulatory Visit (INDEPENDENT_AMBULATORY_CARE_PROVIDER_SITE_OTHER): Payer: Medicare Other

## 2022-07-06 ENCOUNTER — Encounter: Payer: Self-pay | Admitting: Family Medicine

## 2022-07-06 ENCOUNTER — Ambulatory Visit (INDEPENDENT_AMBULATORY_CARE_PROVIDER_SITE_OTHER): Payer: Medicare Other | Admitting: Family Medicine

## 2022-07-06 VITALS — BP 137/66 | HR 76 | Temp 98.4°F | Wt 115.0 lb

## 2022-07-06 DIAGNOSIS — I129 Hypertensive chronic kidney disease with stage 1 through stage 4 chronic kidney disease, or unspecified chronic kidney disease: Secondary | ICD-10-CM

## 2022-07-06 DIAGNOSIS — W19XXXA Unspecified fall, initial encounter: Secondary | ICD-10-CM

## 2022-07-06 DIAGNOSIS — M25551 Pain in right hip: Secondary | ICD-10-CM

## 2022-07-06 DIAGNOSIS — N184 Chronic kidney disease, stage 4 (severe): Secondary | ICD-10-CM | POA: Diagnosis not present

## 2022-07-06 DIAGNOSIS — E1122 Type 2 diabetes mellitus with diabetic chronic kidney disease: Secondary | ICD-10-CM | POA: Diagnosis not present

## 2022-07-06 DIAGNOSIS — D631 Anemia in chronic kidney disease: Secondary | ICD-10-CM | POA: Diagnosis not present

## 2022-07-06 DIAGNOSIS — N2889 Other specified disorders of kidney and ureter: Secondary | ICD-10-CM

## 2022-07-06 NOTE — Progress Notes (Signed)
Subjective: CC: Renal nodule PCP: [name], DO HPI:Erica Crosby is a 77 y.o. female presenting to clinic today for:  1.  Renal nodule/ anemia This was initially identified last fall and MRI to further evaluate was ordered but patient unfortunately no showed that appointment.  She is accompanied today's visit by her daughter.  She assures [place] that she will get her mother to this appointment.  She continues to follow-up with nephrology and unfortunately has developed some moderate to severe anemia such that she has started infusions with EPO.  Her hemoglobin appear to be trending up.  Next visit is in 2 weeks.  Reports fatigue but no reports of shortness of breath, LOC or active bleeding.  She did sustain a fall a few days ago and has ongoing right-sided hip pain.  She had a physical examination the day that this did occur and there is no concern for fracture but she notes that the pain continues.  She cannot take NSAIDs secondary to renal disease.  Ambulating independently with use of cane   ROS: Per HPI  Allergies  Allergen Reactions   Carvedilol Other (See Comments)    Lips swelling   Iodinated Contrast Media     Unknown reaction    Latex     Unknown reaction    Shellfish Allergy     Unknown Reaction    Past Medical History:  Diagnosis Date   Anemia    Arthritis    Cataract    Coronary artery disease    Mild plaque 2012   Diabetes mellitus    Essential hypertension 02/10/2012   GERD (gastroesophageal reflux disease)    Hyperlipidemia    Hypertension    Neuropathy    Peptic ulcer    Rheumatoid arthritis ([place])    TIA (transient ischemic attack) 02/10/2012    Current Outpatient Medications:    Accu-Chek Softclix Lancets lancets, CHECK BLOOD SUGAR TWICE A DAY AND AS NEEDED Dx E11.21, Disp: 200 each, Rfl: 3   amLODipine (NORVASC) 2.5 MG tablet, Take 1 tablet (2.5 mg total) by mouth daily., Disp: 90 tablet, Rfl: 0   aspirin EC 81 MG tablet, Take 81 mg by mouth  daily., Disp: , Rfl:    atorvastatin (LIPITOR) 10 MG tablet, TAKE 1 TABLET DAILY, Disp: 90 tablet, Rfl: 1   Blood Glucose Monitoring Suppl (ACCU-CHEK AVIVA PLUS) w/Device KIT, Test BS BID and prn Dx E11.21, Disp: 1 kit, Rfl: 0   Cholecalciferol 125 MCG (5000 UT) TABS, Take 5,000 Units by mouth daily., Disp: , Rfl:    clobetasol (TEMOVATE) 0.05 % external solution, Apply 1 Application topically daily as needed (scalp irriration)., Disp: 50 mL, Rfl: 1   Continuous Blood Gluc Sensor ([name] 2 SENSOR) MISC, USE TO TEST BLOOD SUGAR CONTINUOUSLY. Dx: E11.65. ADVANCED DIABETES SUPPLY VIA [place], Disp: , Rfl:    fluticasone (FLONASE) 50 MCG/ACT nasal spray, USE 2 SPRAYS IN EACH NOSTRIL ONCE DAILY, Disp: 16 g, Rfl: 6   folic acid (FOLVITE) 1 MG tablet, TAKE 1 TABLET DAILY, Disp: 90 tablet, Rfl: 1   glucose blood (ACCU-CHEK AVIVA PLUS) test strip, CHECK BLOOD SUGAR TWICE DAILY AND AS NEEDED, Disp: 200 strip, Rfl: 3   insulin glargine (LANTUS SOLOSTAR) 100 UNIT/ML Solostar Pen, Inject 5-10 Units into the skin at bedtime., Disp: 15 mL, Rfl: 2   Omega-3 Fatty Acids (FISH OIL) 1000 MG CAPS, Take 1,000 mg by mouth daily., Disp: , Rfl:    pantoprazole (PROTONIX) 40 MG tablet, TAKE  1 TABLET DAILY, Disp: 90 tablet, Rfl: 1 Social History   Socioeconomic History   Marital status: Widowed    Spouse name: Not on file   Number of children: 3   Years of education: 43   Highest education level: High school graduate  Occupational History   Occupation: retired  Tobacco Use   Smoking status: Former    Years: 25.00    Types: Cigarettes, E-cigarettes    Quit date: 02/02/2013    Years since quitting: 9.4   Smokeless tobacco: Never  Vaping Use   Vaping Use: Never used  Substance and Sexual Activity   Alcohol use: No   Drug use: No   Sexual activity: Not Currently    Birth control/protection: Post-menopausal  Other Topics Concern   Not on file  Social History Narrative   Daughter stays with her.     Son comes daily when daughter is working   Scientist, physiological Strain: Low Risk  (04/30/2022)   Overall Financial Resource Strain (CARDIA)    Difficulty of Paying Living Expenses: Not very hard  Food Insecurity: No Food Insecurity (04/30/2022)   Hunger Vital Sign    Worried About Running Out of Food in the Last Year: Never true    Ran Out of Food in the Last Year: Never true  Transportation Needs: Unmet Transportation Needs (02/01/2022)   PRAPARE - Hydrologist (Medical): Yes    Lack of Transportation (Non-Medical): Yes  Physical Activity: Inactive (01/15/2022)   Exercise Vital Sign    Days of Exercise per Week: 0 days    Minutes of Exercise per Session: 0 min  Stress: Stress Concern Present (01/15/2022)   [place]    Feeling of Stress : To some extent  Social Connections: Socially Isolated (03/08/2021)   Social Connection and Isolation Panel [NHANES]    Frequency of Communication with Friends and Family: More than three times a week    Frequency of Social Gatherings with Friends and Family: Once a week    Attends Religious Services: Never    Marine scientist or Organizations: No    Attends Archivist Meetings: Never    Marital Status: Widowed  Intimate Partner Violence: Not At Risk (02/01/2022)   Humiliation, Afraid, Rape, and Kick questionnaire    Fear of Current or Ex-Partner: No    Emotionally Abused: No    Physically Abused: No    Sexually Abused: No   Family History  Problem Relation Age of Onset   Heart disease Sister        Congeital (died age 3) with "enlarged heart"   CAD Sister    Diabetes Mother    Heart disease Mother        Later onset heart disease    Dementia Mother    Alcohol abuse Father    Liver disease Father    CAD Sister 32       CABG   Early death Sister    Diabetes Brother    Kidney disease Brother         related to DM   Diabetes Brother     Objective: Office vital signs reviewed. BP 137/66   Pulse 76   Temp 98.4 F (36.9 C)   Wt 115 lb (52.2 kg)   SpO2 100%   BMI 18.01 kg/m   Physical Examination:  General: Awake, alert, thin chronically ill-appearing female,  No acute distress HEENT: No conjunctival pallor. Cardio: regular rate and rhythm, S1S2 heard, no murmurs appreciated Pulm: clear to auscultation bilaterally, no wheezes, rhonchi or rales; normal work of breathing on room air MSK: Antalgic gait and station.  Utilizing cane for ambulation  DG Hip Unilat W OR W/O Pelvis 2-3 Views Right  Result Date: 07/06/2022 CLINICAL DATA:  Fall.  Hip pain. EXAM: DG HIP (WITH OR WITHOUT PELVIS) 2-3V RIGHT COMPARISON:  CT abdomen pelvis dated February 09, 2022. FINDINGS: There is no evidence of hip fracture or dislocation. There is no evidence of arthropathy or other focal bone abnormality. IMPRESSION: Negative. Electronically Signed   By: Titus Dubin M.D.   On: 07/06/2022 16:09    Assessment/ Plan: 77 y.o. 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  Expressed need for this evaluation.  Continue to follow-up with nephrology and cardiology as directed.  I had plan to obtain CBC but they wish to defer this until the next visit with specialist  Plain films obtained of that right hip given ongoing pain and frailty.  No evidence of fracture.  Likely bony contusion.  Continue supportive care with  heating pads, topical analgesics of choice and/or Tylenol if needed  No orders of the defined types were placed in this encounter.  No orders of the defined types were placed in this encounter.    [name], DO [place] [phone]

## 2022-07-06 NOTE — Patient Instructions (Signed)
MRI has been reordered.  Please make sure to call our office if you do not get a call for appt within the next week.  I will call with results of this xray.

## 2022-07-09 ENCOUNTER — Ambulatory Visit (HOSPITAL_COMMUNITY)
Admission: RE | Admit: 2022-07-09 | Discharge: 2022-07-09 | Disposition: A | Discharge status: Home / Self Care | Payer: Medicare Other | Source: Ambulatory Visit | Attending: Cardiology | Admitting: Cardiology

## 2022-07-09 ENCOUNTER — Encounter: Payer: Self-pay | Admitting: Family Medicine

## 2022-07-09 DIAGNOSIS — I342 Nonrheumatic mitral (valve) stenosis: Secondary | ICD-10-CM | POA: Diagnosis not present

## 2022-07-09 DIAGNOSIS — I340 Nonrheumatic mitral (valve) insufficiency: Secondary | ICD-10-CM | POA: Insufficient documentation

## 2022-07-09 LAB — ECHOCARDIOGRAM COMPLETE
AR max vel: 1.95 cm2
AV Area VTI: 1.66 cm2
AV Area mean vel: 1.97 cm2
AV Mean grad: 4.5 mmHg
AV Peak grad: 9.7 mmHg
Ao pk vel: 1.56 m/s
Area-P 1/2: 3.77 cm2
MV M vel: 6.07 m/s
MV Peak grad: 147.4 mmHg
MV VTI: 0.88 cm2
Radius: 0.5 cm
S' Lateral: 2.9 cm

## 2022-07-09 NOTE — Progress Notes (Signed)
*  PRELIMINARY RESULTS* Echocardiogram 2D Echocardiogram has been performed.  Erica Crosby 07/09/2022, 2:50 PM

## 2022-07-12 NOTE — Progress Notes (Signed)
  Care Coordination Note  07/12/2022 Name: Erica Crosby MRN: [id number] DOB: 1945-08-03  Erica Crosby is a 77 y.o. year old female who is a primary care patient of [name], DO and is actively engaged with the care management team. I reached out to [name] by phone today to assist with re-scheduling a follow up visit with the RN Case Manager  Follow up plan: Unsuccessful telephone outreach attempt made.   [place]  Direct Dial: [phone]

## 2022-07-16 ENCOUNTER — Encounter: Payer: Self-pay | Admitting: *Deleted

## 2022-07-16 NOTE — Progress Notes (Signed)
  Care Coordination Note  07/16/2022 Name: Erica Crosby MRN: [id number] DOB: June 11, 1946  Erica Crosby is a 77 y.o. year old female who is a primary care patient of [name], DO and is actively engaged with the care management team. I reached out to [name] by phone today to assist with re-scheduling a follow up visit with the RN Case Manager  Follow up plan: Unsuccessful telephone outreach attempt made. A HIPAA compliant phone message was left for the patient providing contact information and requesting a return call.  We have been unable to make contact with the patient for follow up. The care management team is available to follow up with the patient after provider conversation with the patient regarding recommendation for care management engagement and subsequent re-referral to the care management team.   [place]  Direct Dial: [phone]

## 2022-07-19 ENCOUNTER — Encounter (HOSPITAL_COMMUNITY)
Admission: RE | Admit: 2022-07-19 | Discharge: 2022-07-19 | Disposition: A | Discharge status: Home / Self Care | Payer: Medicare Other | Source: Ambulatory Visit | Attending: Nephrology | Admitting: Nephrology

## 2022-07-19 VITALS — BP 141/66 | HR 69 | Temp 98.8°F | Resp 16

## 2022-07-19 DIAGNOSIS — N184 Chronic kidney disease, stage 4 (severe): Secondary | ICD-10-CM | POA: Diagnosis not present

## 2022-07-19 DIAGNOSIS — D631 Anemia in chronic kidney disease: Secondary | ICD-10-CM | POA: Diagnosis not present

## 2022-07-19 LAB — RENAL FUNCTION PANEL
Albumin: 3 g/dL — ABNORMAL LOW (ref 3.5–5.0)
Anion gap: 9 (ref 5–15)
BUN: 44 mg/dL — ABNORMAL HIGH (ref 8–23)
CO2: 21 mmol/L — ABNORMAL LOW (ref 22–32)
Calcium: 8.3 mg/dL — ABNORMAL LOW (ref 8.9–10.3)
Chloride: 104 mmol/L (ref 98–111)
Creatinine, Ser: 3.12 mg/dL — ABNORMAL HIGH (ref 0.44–1.00)
GFR, Estimated: 15 mL/min — ABNORMAL LOW (ref >60)
Glucose, Bld: 116 mg/dL — ABNORMAL HIGH (ref 70–99)
Phosphorus: 5 mg/dL — ABNORMAL HIGH (ref 2.5–4.6)
Potassium: 4.2 mmol/L (ref 3.5–5.1)
Sodium: 134 mmol/L — ABNORMAL LOW (ref 135–145)

## 2022-07-19 LAB — CBC
HCT: 22.8 % — ABNORMAL LOW (ref 36.0–46.0)
Hemoglobin: 7.3 g/dL — ABNORMAL LOW (ref 12.0–15.0)
MCH: 29.9 pg (ref 26.0–34.0)
MCHC: 32 g/dL (ref 30.0–36.0)
MCV: 93.4 fL (ref 80.0–100.0)
Platelets: 252 10*3/uL (ref 150–400)
RBC: 2.44 MIL/uL — ABNORMAL LOW (ref 3.87–5.11)
RDW: 15.6 % — ABNORMAL HIGH (ref 11.5–15.5)
WBC: 5.7 10*3/uL (ref 4.0–10.5)
nRBC: 0 % (ref 0.0–0.2)

## 2022-07-19 LAB — FERRITIN: Ferritin: 319 ng/mL — ABNORMAL HIGH (ref 11–307)

## 2022-07-19 LAB — IRON AND TIBC
Iron: 32 ug/dL (ref 28–170)
Saturation Ratios: 15 % (ref 10.4–31.8)
TIBC: 220 ug/dL — ABNORMAL LOW (ref 250–450)
UIBC: 188 ug/dL

## 2022-07-19 MED ORDER — EPOETIN ALFA-EPBX 20000 UNIT/ML IJ SOLN
20000.0000 [IU] | Freq: Once | INTRAMUSCULAR | Status: AC
  Administered 2022-07-19: 20000 [IU] via SUBCUTANEOUS

## 2022-07-19 MED ORDER — CLONIDINE HCL 0.1 MG PO TABS: 0.1000 mg | ORAL_TABLET | ORAL | Status: DC | PRN

## 2022-07-19 MED ORDER — EPOETIN ALFA-EPBX 10000 UNIT/ML IJ SOLN: 20000.0000 [IU] | Freq: Once | INTRAMUSCULAR | Status: DC

## 2022-07-19 NOTE — Progress Notes (Signed)
Diagnosis: Anemia in Chronic Kidney Disease  Provider:  Corliss Parish MD  Procedure: Injection  Retacrit (epoetin alfa-epbx), Dose: 20000 Units, Site: subcutaneous, Number of injections: 1  Post Care: Patient declined observation  Discharge: Condition: Stable, Destination: Home . AVS provided to patient.   Performed by:  Binnie Kand, RN

## 2022-07-21 LAB — PTH, INTACT AND CALCIUM
Calcium, Total (PTH): 9 mg/dL (ref 8.7–10.3)
PTH: 44 pg/mL (ref 15–65)

## 2022-07-23 LAB — POCT HEMOGLOBIN-HEMACUE: Hemoglobin: 7.4 g/dL — ABNORMAL LOW (ref 12.0–15.0)

## 2022-07-31 ENCOUNTER — Telehealth: Payer: Self-pay | Admitting: Family Medicine

## 2022-07-31 ENCOUNTER — Ambulatory Visit (HOSPITAL_COMMUNITY)
Admission: RE | Admit: 2022-07-31 | Discharge: 2022-07-31 | Disposition: A | Discharge status: Home / Self Care | Payer: Medicare Other | Source: Ambulatory Visit | Attending: Family Medicine | Admitting: Family Medicine

## 2022-07-31 ENCOUNTER — Encounter (HOSPITAL_COMMUNITY): Payer: Self-pay

## 2022-07-31 DIAGNOSIS — E1122 Type 2 diabetes mellitus with diabetic chronic kidney disease: Secondary | ICD-10-CM

## 2022-07-31 DIAGNOSIS — N2889 Other specified disorders of kidney and ureter: Secondary | ICD-10-CM

## 2022-07-31 DIAGNOSIS — D631 Anemia in chronic kidney disease: Secondary | ICD-10-CM

## 2022-07-31 NOTE — Telephone Encounter (Signed)
Daughter called to let PCP know that pt was unable to have MRI today because the doctor advised that pt not have it done due to her kidney level and because she is taking iron infusions and injections.

## 2022-07-31 NOTE — Telephone Encounter (Signed)
Is this something you can check into for me?  I was advised by the radiologist with [facility] to order that specific test and indicate CKD and specific contrast to be used, which he said was renal friendly.  Is it because she is taking IRON that she cannot have study done? If so what is the timing to study from last infusion?  Who can I talk to about this patient?

## 2022-08-02 ENCOUNTER — Ambulatory Visit: Payer: Self-pay | Admitting: *Deleted

## 2022-08-02 ENCOUNTER — Encounter (HOSPITAL_COMMUNITY)
Admission: RE | Admit: 2022-08-02 | Discharge: 2022-08-02 | Disposition: A | Discharge status: Home / Self Care | Payer: Medicare Other | Source: Ambulatory Visit | Attending: Nephrology | Admitting: Nephrology

## 2022-08-02 VITALS — BP 159/76 | HR 71 | Temp 98.3°F | Resp 16

## 2022-08-02 DIAGNOSIS — D631 Anemia in chronic kidney disease: Secondary | ICD-10-CM

## 2022-08-02 DIAGNOSIS — N184 Chronic kidney disease, stage 4 (severe): Secondary | ICD-10-CM | POA: Diagnosis not present

## 2022-08-02 LAB — POCT HEMOGLOBIN-HEMACUE: Hemoglobin: 8.8 g/dL — ABNORMAL LOW (ref 12.0–15.0)

## 2022-08-02 MED ORDER — EPOETIN ALFA-EPBX 10000 UNIT/ML IJ SOLN: 20000.0000 [IU] | Freq: Once | INTRAMUSCULAR | Status: DC

## 2022-08-02 MED ORDER — CLONIDINE HCL 0.1 MG PO TABS: 0.1000 mg | ORAL_TABLET | ORAL | Status: DC | PRN

## 2022-08-02 MED ORDER — EPOETIN ALFA-EPBX 20000 UNIT/ML IJ SOLN
20000.0000 [IU] | Freq: Once | INTRAMUSCULAR | Status: AC
  Administered 2022-08-02: 20000 [IU] via SUBCUTANEOUS

## 2022-08-02 NOTE — Patient Outreach (Signed)
  Care Coordination Case closure 08/02/22 Name: DAMIAN HOFSTRA    MRN: [id number]       DOB: 06-01-1946   Care Coordination Interventions: Case closure after notified by [facility] CMA of 3 + unsuccessful outreaches to patient as of 07/16/22  Joelene Millin [name], RN, BSN, [place] Coordinator Office number [phone]

## 2022-08-02 NOTE — Addendum Note (Signed)
Encounter addended by: Baxter Hire, RN on: 08/02/2022 2:04 PM  Actions taken: Charge Capture section accepted

## 2022-08-02 NOTE — Progress Notes (Signed)
Diagnosis: Anemia in Chronic Kidney Disease  Provider:  Corliss Parish MD  Procedure: Injection  Retacrit (epoetin alfa-epbx), Dose: 20000 Units, Site: subcutaneous, Number of injections: 1  Hgb 8.8  Post Care: Patient declined observation  Discharge: Condition: Good, Destination: Home . AVS Provided  Performed by:  Binnie Kand, RN

## 2022-08-08 NOTE — Telephone Encounter (Signed)
Attempted to reach someone at number provided. No answer.  Will try again on Friday 08/10/22

## 2022-08-10 ENCOUNTER — Other Ambulatory Visit: Payer: Self-pay | Admitting: Family Medicine

## 2022-08-13 NOTE — Telephone Encounter (Signed)
Audelia Acton out to lunch.  Will try to call back again this afternoon

## 2022-08-15 ENCOUNTER — Telehealth: Payer: Self-pay | Admitting: Family Medicine

## 2022-08-15 NOTE — Telephone Encounter (Signed)
Spoke to MRI and apparently the iron infusions are what is impacting her ability to get MRI of kidneys.  There is worry it will be taken up by liver and skew ability to assess the nodule noted on kidney.  They recommended that we schedule MRI again after she has completed iron treatments and is 3 months out.  Dr Moshe Cipro, do you have any experience with the lesion of concern found at [facility] on [facility]?  I was thinking about maybe involving urology but I welcome your medical opinion if we can avoid a referral for her.

## 2022-08-16 ENCOUNTER — Encounter (HOSPITAL_COMMUNITY)
Admission: RE | Admit: 2022-08-16 | Discharge: 2022-08-16 | Disposition: A | Discharge status: Home / Self Care | Payer: Medicare Other | Source: Ambulatory Visit | Attending: Nephrology | Admitting: Nephrology

## 2022-08-16 VITALS — BP 178/85 | HR 74 | Temp 97.9°F | Resp 16

## 2022-08-16 DIAGNOSIS — D631 Anemia in chronic kidney disease: Secondary | ICD-10-CM

## 2022-08-16 DIAGNOSIS — N184 Chronic kidney disease, stage 4 (severe): Secondary | ICD-10-CM | POA: Diagnosis not present

## 2022-08-16 LAB — POCT HEMOGLOBIN-HEMACUE: Hemoglobin: 9.4 g/dL — ABNORMAL LOW (ref 12.0–15.0)

## 2022-08-16 MED ORDER — EPOETIN ALFA-EPBX 20000 UNIT/ML IJ SOLN
20000.0000 [IU] | Freq: Once | INTRAMUSCULAR | Status: AC
  Administered 2022-08-16: 20000 [IU] via SUBCUTANEOUS

## 2022-08-16 MED ORDER — EPOETIN ALFA-EPBX 10000 UNIT/ML IJ SOLN: 20000.0000 [IU] | Freq: Once | INTRAMUSCULAR | Status: DC

## 2022-08-16 MED ORDER — CLONIDINE HCL 0.1 MG PO TABS: 0.1000 mg | ORAL_TABLET | ORAL | Status: DC | PRN

## 2022-08-16 NOTE — Progress Notes (Signed)
Diagnosis: Anemia in Chronic Kidney Disease  Provider:  Corliss Parish MD  Procedure: Injection  Retacrit (epoetin alfa-epbx), Dose: 20000 Units, Site: subcutaneous, Number of injections: 1  Hgb 9.4  Post Care: Patient declined observation  Discharge: Condition: Stable, Destination: Home . AVS Provided  Performed by:  Binnie Kand, RN

## 2022-08-20 NOTE — Telephone Encounter (Signed)
Please inform patient AND her daughter that I have reached out to Dr Moshe Cipro AND [place] radiology re: the MRI of the kidney.  Unfortunately, her kidney disease and need for iron are impacting her getting the MRI.  Dr Moshe Cipro recommended proceeding with referral to urology so I placed a STAT referral to the one in [place].  If they prefer [place], please let me know so we can get Loma Sousa to redirect that referral.

## 2022-08-21 NOTE — Telephone Encounter (Signed)
Left vm for cb

## 2022-08-23 NOTE — Telephone Encounter (Signed)
Attempted to call pt again , no answer

## 2022-08-27 ENCOUNTER — Other Ambulatory Visit: Payer: Self-pay | Admitting: Family Medicine

## 2022-08-27 DIAGNOSIS — M069 Rheumatoid arthritis, unspecified: Secondary | ICD-10-CM

## 2022-08-30 ENCOUNTER — Encounter (HOSPITAL_COMMUNITY)
Admission: RE | Admit: 2022-08-30 | Discharge: 2022-08-30 | Disposition: A | Discharge status: Home / Self Care | Payer: Medicare Other | Source: Ambulatory Visit | Attending: Nephrology | Admitting: Nephrology

## 2022-08-30 VITALS — BP 150/76 | HR 81 | Temp 98.1°F | Resp 16

## 2022-08-30 DIAGNOSIS — N184 Chronic kidney disease, stage 4 (severe): Secondary | ICD-10-CM | POA: Diagnosis not present

## 2022-08-30 DIAGNOSIS — D631 Anemia in chronic kidney disease: Secondary | ICD-10-CM | POA: Diagnosis not present

## 2022-08-30 LAB — POCT HEMOGLOBIN-HEMACUE: Hemoglobin: 9.3 g/dL — ABNORMAL LOW (ref 12.0–15.0)

## 2022-08-30 MED ORDER — EPOETIN ALFA-EPBX 10000 UNIT/ML IJ SOLN: 20000.0000 [IU] | Freq: Once | INTRAMUSCULAR | Status: DC

## 2022-08-30 MED ORDER — EPOETIN ALFA-EPBX 20000 UNIT/ML IJ SOLN
20000.0000 [IU] | Freq: Once | INTRAMUSCULAR | Status: AC
  Administered 2022-08-30: 20000 [IU] via SUBCUTANEOUS

## 2022-08-30 NOTE — Addendum Note (Signed)
Encounter addended by: Baxter Hire, RN on: 08/30/2022 2:37 PM  Actions taken: Therapy plan modified

## 2022-08-30 NOTE — Progress Notes (Signed)
Diagnosis: Anemia in Chronic Kidney Disease  Provider:  Corliss Parish MD  Procedure: Injection  Retacrit (epoetin alfa-epbx), Dose: 20000 Units, Site: subcutaneous, Number of injections: 1  Hgb 9.3  Post Care: Patient declined observation  Discharge: Condition: Stable, Destination: Home . AVS Provided  Performed by:  Binnie Kand, RN

## 2022-09-04 ENCOUNTER — Telehealth: Payer: Self-pay | Admitting: Family Medicine

## 2022-09-04 NOTE — Telephone Encounter (Signed)
Called patient to schedule Medicare Annual Wellness Visit (AWV). Left message for patient to call back and schedule Medicare Annual Wellness Visit (AWV).  Last date of AWV: 03/08/2021   Please schedule an appointment at any time with either Mickel Baas or [place], NHA's. .  If any questions, please contact me at [phone].  Thank you,  Colletta [place],  [place] Program Direct Dial ??[phone]

## 2022-09-11 DIAGNOSIS — R634 Abnormal weight loss: Secondary | ICD-10-CM | POA: Diagnosis not present

## 2022-09-11 DIAGNOSIS — M069 Rheumatoid arthritis, unspecified: Secondary | ICD-10-CM | POA: Diagnosis not present

## 2022-09-11 DIAGNOSIS — N179 Acute kidney failure, unspecified: Secondary | ICD-10-CM | POA: Diagnosis not present

## 2022-09-11 DIAGNOSIS — N183 Chronic kidney disease, stage 3 unspecified: Secondary | ICD-10-CM | POA: Diagnosis not present

## 2022-09-11 DIAGNOSIS — D631 Anemia in chronic kidney disease: Secondary | ICD-10-CM | POA: Diagnosis not present

## 2022-09-11 DIAGNOSIS — E1122 Type 2 diabetes mellitus with diabetic chronic kidney disease: Secondary | ICD-10-CM | POA: Diagnosis not present

## 2022-09-11 DIAGNOSIS — Z7189 Other specified counseling: Secondary | ICD-10-CM | POA: Diagnosis not present

## 2022-09-11 DIAGNOSIS — I129 Hypertensive chronic kidney disease with stage 1 through stage 4 chronic kidney disease, or unspecified chronic kidney disease: Secondary | ICD-10-CM | POA: Diagnosis not present

## 2022-09-11 DIAGNOSIS — N2889 Other specified disorders of kidney and ureter: Secondary | ICD-10-CM | POA: Diagnosis not present

## 2022-09-13 ENCOUNTER — Encounter (HOSPITAL_COMMUNITY)
Admission: RE | Admit: 2022-09-13 | Discharge: 2022-09-13 | Disposition: A | Discharge status: Home / Self Care | Payer: Medicare Other | Source: Ambulatory Visit | Attending: Nephrology | Admitting: Nephrology

## 2022-09-13 DIAGNOSIS — N184 Chronic kidney disease, stage 4 (severe): Secondary | ICD-10-CM | POA: Diagnosis not present

## 2022-09-13 DIAGNOSIS — D631 Anemia in chronic kidney disease: Secondary | ICD-10-CM | POA: Diagnosis not present

## 2022-09-13 LAB — RENAL FUNCTION PANEL
Albumin: 3.1 g/dL — ABNORMAL LOW (ref 3.5–5.0)
Anion gap: 11 (ref 5–15)
BUN: 50 mg/dL — ABNORMAL HIGH (ref 8–23)
CO2: 21 mmol/L — ABNORMAL LOW (ref 22–32)
Calcium: 8.7 mg/dL — ABNORMAL LOW (ref 8.9–10.3)
Chloride: 105 mmol/L (ref 98–111)
Creatinine, Ser: 3 mg/dL — ABNORMAL HIGH (ref 0.44–1.00)
GFR, Estimated: 16 mL/min — ABNORMAL LOW (ref >60)
Glucose, Bld: 154 mg/dL — ABNORMAL HIGH (ref 70–99)
Phosphorus: 4.8 mg/dL — ABNORMAL HIGH (ref 2.5–4.6)
Potassium: 4 mmol/L (ref 3.5–5.1)
Sodium: 137 mmol/L (ref 135–145)

## 2022-09-13 LAB — IRON AND TIBC
Iron: 27 ug/dL — ABNORMAL LOW (ref 28–170)
Saturation Ratios: 12 % (ref 10.4–31.8)
TIBC: 218 ug/dL — ABNORMAL LOW (ref 250–450)
UIBC: 191 ug/dL

## 2022-09-13 LAB — POCT HEMOGLOBIN-HEMACUE: Hemoglobin: 11.3 g/dL — ABNORMAL LOW (ref 12.0–15.0)

## 2022-09-13 LAB — FERRITIN: Ferritin: 157 ng/mL (ref 11–307)

## 2022-09-13 MED ORDER — EPOETIN ALFA-EPBX 10000 UNIT/ML IJ SOLN
20000.0000 [IU] | Freq: Once | INTRAMUSCULAR | Status: AC
  Administered 2022-09-13: 20000 [IU] via SUBCUTANEOUS

## 2022-09-13 MED ORDER — CLONIDINE HCL 0.1 MG PO TABS: 0.1000 mg | ORAL_TABLET | ORAL | Status: DC | PRN

## 2022-09-13 NOTE — Addendum Note (Signed)
Encounter addended by: Baxter Hire, RN on: 09/13/2022 3:03 PM  Actions taken: MAR administration edited, MAR administration accepted, Charge Capture section accepted

## 2022-09-13 NOTE — Addendum Note (Signed)
Encounter addended by: Baxter Hire, RN on: 09/13/2022 2:55 PM  Actions taken: Therapy plan modified

## 2022-09-13 NOTE — Progress Notes (Signed)
Diagnosis: Anemia in Chronic Kidney Disease  Provider:  Corliss Parish MD  Procedure: Injection  Retacrit (epoetin alfa-epbx), Dose: 20000 Units, Site: subcutaneous, Number of injections: 2  HGB 11.3  Post Care: Patient declined observation  Discharge: Condition: Good, Destination: Home . AVS Provided  Performed by:  Grayland Ormond, RN

## 2022-09-14 LAB — PTH, INTACT AND CALCIUM
Calcium, Total (PTH): 9.1 mg/dL (ref 8.7–10.3)
PTH: 36 pg/mL (ref 15–65)

## 2022-09-25 DIAGNOSIS — E1165 Type 2 diabetes mellitus with hyperglycemia: Secondary | ICD-10-CM | POA: Diagnosis not present

## 2022-09-26 ENCOUNTER — Telehealth: Payer: Self-pay | Admitting: Family Medicine

## 2022-09-26 ENCOUNTER — Ambulatory Visit: Payer: Medicare Other | Admitting: Cardiology

## 2022-09-26 NOTE — Telephone Encounter (Signed)
Called patient to schedule Medicare Annual Wellness Visit (AWV). No voicemail available to leave a message.  Last date of AWV: 03/08/2021   Please schedule an appointment at any time with either Erica Crosby or Erica Crosby, NHA's. .  If any questions, please contact me at [phone].  Thank you,  Judeth Cornfield,  AMB Clinical Support [facility] AWV Program Direct Dial ??[phone]

## 2022-09-27 ENCOUNTER — Encounter (HOSPITAL_COMMUNITY)
Admission: RE | Admit: 2022-09-27 | Discharge: 2022-09-27 | Disposition: A | Discharge status: Home / Self Care | Payer: Medicare Other | Source: Ambulatory Visit | Attending: Nephrology | Admitting: Nephrology

## 2022-09-27 VITALS — BP 160/78 | HR 73 | Temp 98.3°F | Resp 16

## 2022-09-27 DIAGNOSIS — D631 Anemia in chronic kidney disease: Secondary | ICD-10-CM | POA: Insufficient documentation

## 2022-09-27 DIAGNOSIS — N184 Chronic kidney disease, stage 4 (severe): Secondary | ICD-10-CM | POA: Insufficient documentation

## 2022-09-27 LAB — POCT HEMOGLOBIN-HEMACUE: Hemoglobin: 10 g/dL — ABNORMAL LOW (ref 12.0–15.0)

## 2022-09-27 MED ORDER — EPOETIN ALFA-EPBX 10000 UNIT/ML IJ SOLN
20000.0000 [IU] | Freq: Once | INTRAMUSCULAR | Status: AC
  Administered 2022-09-27: 20000 [IU] via SUBCUTANEOUS
  Filled 2022-09-27: qty 2

## 2022-09-27 MED ORDER — CLONIDINE HCL 0.1 MG PO TABS: 0.1000 mg | ORAL_TABLET | ORAL | Status: DC | PRN

## 2022-09-27 NOTE — Progress Notes (Signed)
Diagnosis: Anemia in Chronic Kidney Disease  Provider:  Annie Sable MD  Procedure: Injection  Retacrit (epoetin alfa-epbx), Dose: 20000 Units, Site: subcutaneous, Number of injections: 1  Hgb 10.0  Post Care: Patient declined observation  Discharge: Condition: Good, Destination: Home . AVS Provided  Performed by:  Wyvonne Lenz, RN

## 2022-09-28 ENCOUNTER — Telehealth: Payer: Self-pay

## 2022-09-28 NOTE — Telephone Encounter (Signed)
2nd attempt outreach made regarding Safe Start program. Referral received from Dr. Ronalee Belts at [facility] regarding connection to possible resources for patient.  Renal coordinator left confidential voicemail sharing contact information. Pending renal assessment once outreach is successful.  Baruch Gouty Renal Coordinator [facility] [phone]

## 2022-10-05 ENCOUNTER — Other Ambulatory Visit: Payer: Self-pay | Admitting: Family Medicine

## 2022-10-05 DIAGNOSIS — E1169 Type 2 diabetes mellitus with other specified complication: Secondary | ICD-10-CM

## 2022-10-08 DIAGNOSIS — E785 Hyperlipidemia, unspecified: Secondary | ICD-10-CM | POA: Insufficient documentation

## 2022-10-08 NOTE — Progress Notes (Unsigned)
Cardiology Office Note:   Date:  10/10/2022  ID:  Erica Crosby, DOB 10/02/1945, MRN [id number]  History of Present Illness:   Erica Crosby is a 77 y.o. female who presents for evaluation of mitral regurgitation.   Echo in 2020 when she was hospitalized at [facility] demonstrated moderate mitral regurgitation.   This was identified at the time of hospitalization for respiratory failure and COVID.  She was seen at [facility] in August 2023 and her proBNP was elevated in the emergency room but chest x-ray was clear.  Troponin was very mildly elevated at 37.   She had significant anemia and elevated creatinine which they thought were probably baseline for her. She was transferred to [facility].   She was found to have on echocardiography well-preserved ejection fraction.  However, she had severe mitral regurgitation.  This was a transthoracic echocardiogram.   She had some trivial pericardial effusion and left pleural effusion.  It was suggested that she might need perfusion imaging in the future but it was not felt that she was having an acute coronary event.  She had acute on chronic renal insufficiency with a creatinine was 3.23. 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     She has been having nosebleeds.  She was post to get an MRI for her kidney lesion but this was canceled because of her renal insufficiency.  She has been talking to the kidney doctors and still not wanting to consider dialysis but they are doing some education.  She seems to be somewhat confused about details.  She has not wanted [place] to do much in the way of medical therapy.  She denies any obvious new acute symptoms other than the nosebleeds.  She feels weak.  She is not having any presyncope or syncope.  She is not having any palpitations.  She denies any chest pressure, neck or arm discomfort.  ROS: Positive for nose bleeds.  As stated in the HPI and negative for all other systems.  Studies Reviewed:     EKG:  NA   Risk Assessment/Calculations:              Physical Exam:   VS:  BP 138/64   Pulse 68   Ht  (1.702 m)   Wt 115 lb (52.2 kg)   BMI 18.01 kg/m    Wt Readings from Last 3 Encounters:  10/10/22 115 lb (52.2 kg)  07/06/22 115 lb (52.2 kg)  04/17/22 124 lb 3.2 oz (56.3 kg)     GEN: Well nourished, well developed in no acute distress NECK: No JVD; No carotid bruits CARDIAC: RRR, 3 out of 6 apical and axillary holosystolic murmur, no diastolic murmurs, rubs, gallops RESPIRATORY:  Clear to auscultation without rales, wheezing or rhonchi  ABDOMEN: Soft, non-tender, non-distended EXTREMITIES:  No edema; No deformity   ASSESSMENT AND PLAN:   MR:    The MR is moderate to moderately severe.  We have had a long discussion about this.  She would not want the necessary tests using contrast that she had to have prior to consideration of valve repair.  She would not want to consent to surgery at this point.  She has significant comorbidities which would make this higher risk.  She understands the risk of progression of her valve disease.  Perhaps at some point in time if she ever consents to dialysis and progresses to that she might consider further testing.  LBBB:   This  has been chronic.  No change in therapy.  CKD 4: Most recent creatinine was 3.0 most recently and she is following with nephrology soon.    Kidney lesion: 2.8 x 2.5 cm high attenuation lesion incidentally found on the left kidney.  I spoke with Erica Ip, DO after the last visit.  The patient had been a no show for MRI and we rescheduled.  She still has not had this as above.   Anemia: Hemoglobin was 10.0 up from 6.8.  I have encouraged her to see ENT about her nosebleeds  Elevated troponin:   She is not having any new chest discomfort.  She had elevated enzymes in the hospital.  She would not want to risk any invasive procedures to evaluate this.  No further workup at this point.  Diabetes  mellitus:   A1c was 6.5.  No change in therapy    6.5.  No change in therapy.    Dyslipidemia: LDL was 77 with an HDL of 44.  No change in therapy.        Signed, Rollene Rotunda, MD

## 2022-10-10 ENCOUNTER — Ambulatory Visit (INDEPENDENT_AMBULATORY_CARE_PROVIDER_SITE_OTHER): Payer: Medicare Other | Admitting: Cardiology

## 2022-10-10 ENCOUNTER — Encounter: Payer: Self-pay | Admitting: Cardiology

## 2022-10-10 VITALS — BP 138/64 | HR 68 | Ht 67.0 in | Wt 115.0 lb

## 2022-10-10 DIAGNOSIS — E785 Hyperlipidemia, unspecified: Secondary | ICD-10-CM

## 2022-10-10 DIAGNOSIS — R7989 Other specified abnormal findings of blood chemistry: Secondary | ICD-10-CM | POA: Diagnosis not present

## 2022-10-10 DIAGNOSIS — I340 Nonrheumatic mitral (valve) insufficiency: Secondary | ICD-10-CM | POA: Diagnosis not present

## 2022-10-10 DIAGNOSIS — E118 Type 2 diabetes mellitus with unspecified complications: Secondary | ICD-10-CM

## 2022-10-10 NOTE — Patient Instructions (Signed)
Medication Instructions:  The current medical regimen is effective;  continue present plan and medications.  *If you need a refill on your cardiac medications before your next appointment, please call your pharmacy*  Follow-Up: At [facility], you and your health needs are our priority.  As part of our continuing mission to provide you with exceptional heart care, we have created designated Provider Care Teams.  These Care Teams include your primary Cardiologist (physician) and Advanced Practice Providers (APPs -  Physician Assistants and Nurse Practitioners) who all work together to provide you with the care you need, when you need it.  We recommend signing up for the patient portal called "MyChart".  Sign up information is provided on this After Visit Summary.  MyChart is used to connect with patients for Virtual Visits (Telemedicine).  Patients are able to view lab/test results, encounter notes, upcoming appointments, etc.  Non-urgent messages can be sent to your provider as well.   To learn more about what you can do with MyChart, go to [email or web address].    Your next appointment:   6 month(s)  Provider:   James Hochrein, MD    

## 2022-10-11 ENCOUNTER — Encounter (HOSPITAL_COMMUNITY)
Admission: RE | Admit: 2022-10-11 | Discharge: 2022-10-11 | Disposition: A | Discharge status: Home / Self Care | Payer: Medicare Other | Source: Ambulatory Visit | Attending: Nephrology | Admitting: Nephrology

## 2022-10-11 VITALS — BP 133/66 | HR 64 | Temp 98.1°F | Resp 16

## 2022-10-11 DIAGNOSIS — D631 Anemia in chronic kidney disease: Secondary | ICD-10-CM

## 2022-10-11 DIAGNOSIS — N184 Chronic kidney disease, stage 4 (severe): Secondary | ICD-10-CM | POA: Diagnosis not present

## 2022-10-11 LAB — IRON AND TIBC
Iron: 27 ug/dL — ABNORMAL LOW (ref 28–170)
Saturation Ratios: 10 % — ABNORMAL LOW (ref 10.4–31.8)
TIBC: 261 ug/dL (ref 250–450)
UIBC: 234 ug/dL

## 2022-10-11 LAB — RENAL FUNCTION PANEL
Albumin: 3.2 g/dL — ABNORMAL LOW (ref 3.5–5.0)
Anion gap: 9 (ref 5–15)
BUN: 62 mg/dL — ABNORMAL HIGH (ref 8–23)
CO2: 22 mmol/L (ref 22–32)
Calcium: 9 mg/dL (ref 8.9–10.3)
Chloride: 106 mmol/L (ref 98–111)
Creatinine, Ser: 3.54 mg/dL — ABNORMAL HIGH (ref 0.44–1.00)
GFR, Estimated: 13 mL/min — ABNORMAL LOW (ref >60)
Glucose, Bld: 122 mg/dL — ABNORMAL HIGH (ref 70–99)
Phosphorus: 4.4 mg/dL (ref 2.5–4.6)
Potassium: 4.8 mmol/L (ref 3.5–5.1)
Sodium: 137 mmol/L (ref 135–145)

## 2022-10-11 LAB — POCT HEMOGLOBIN-HEMACUE: Hemoglobin: 11.7 g/dL — ABNORMAL LOW (ref 12.0–15.0)

## 2022-10-11 LAB — FERRITIN: Ferritin: 137 ng/mL (ref 11–307)

## 2022-10-11 MED ORDER — CLONIDINE HCL 0.1 MG PO TABS: 0.1000 mg | ORAL_TABLET | ORAL | Status: DC | PRN

## 2022-10-11 MED ORDER — EPOETIN ALFA-EPBX 10000 UNIT/ML IJ SOLN
20000.0000 [IU] | Freq: Once | INTRAMUSCULAR | Status: AC
  Administered 2022-10-11: 20000 [IU] via SUBCUTANEOUS

## 2022-10-11 NOTE — Progress Notes (Signed)
Diagnosis: Anemia in Chronic Kidney Disease  Provider:  Annie Sable MD  Procedure: Injection  Retacrit (epoetin alfa-epbx), Dose: 20000 Units, Site: subcutaneous, Number of injections: 1  Hgb 11.7. Administered in abdomen.  Post Care: Patient declined observation  Discharge: Condition: Good, Destination: Home . AVS Provided  Performed by:  Wyvonne Lenz, RN

## 2022-10-13 LAB — PTH, INTACT AND CALCIUM
Calcium, Total (PTH): 9.4 mg/dL (ref 8.7–10.3)
PTH: 34 pg/mL (ref 15–65)

## 2022-10-16 ENCOUNTER — Encounter: Payer: Self-pay | Admitting: Family Medicine

## 2022-10-16 ENCOUNTER — Ambulatory Visit (INDEPENDENT_AMBULATORY_CARE_PROVIDER_SITE_OTHER): Payer: Medicare Other | Admitting: Family Medicine

## 2022-10-16 VITALS — BP 131/70 | HR 63 | Temp 98.5°F | Ht 67.0 in | Wt 115.0 lb

## 2022-10-16 DIAGNOSIS — E440 Moderate protein-calorie malnutrition: Secondary | ICD-10-CM

## 2022-10-16 DIAGNOSIS — Z794 Long term (current) use of insulin: Secondary | ICD-10-CM | POA: Diagnosis not present

## 2022-10-16 DIAGNOSIS — N184 Chronic kidney disease, stage 4 (severe): Secondary | ICD-10-CM | POA: Diagnosis not present

## 2022-10-16 DIAGNOSIS — H6123 Impacted cerumen, bilateral: Secondary | ICD-10-CM | POA: Diagnosis not present

## 2022-10-16 DIAGNOSIS — D631 Anemia in chronic kidney disease: Secondary | ICD-10-CM

## 2022-10-16 DIAGNOSIS — E1122 Type 2 diabetes mellitus with diabetic chronic kidney disease: Secondary | ICD-10-CM | POA: Diagnosis not present

## 2022-10-16 DIAGNOSIS — E1169 Type 2 diabetes mellitus with other specified complication: Secondary | ICD-10-CM

## 2022-10-16 DIAGNOSIS — I129 Hypertensive chronic kidney disease with stage 1 through stage 4 chronic kidney disease, or unspecified chronic kidney disease: Secondary | ICD-10-CM | POA: Diagnosis not present

## 2022-10-16 DIAGNOSIS — N2889 Other specified disorders of kidney and ureter: Secondary | ICD-10-CM | POA: Diagnosis not present

## 2022-10-16 DIAGNOSIS — R040 Epistaxis: Secondary | ICD-10-CM

## 2022-10-16 LAB — BAYER DCA HB A1C WAIVED: HB A1C (BAYER DCA - WAIVED): 6.5 % — ABNORMAL HIGH (ref 4.8–5.6)

## 2022-10-16 NOTE — Progress Notes (Signed)
Subjective: CC:DM PCP: Raliegh Ip, DO HPI:Erica Crosby is a 77 y.o. female presenting to clinic today for:  1. 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  Compliant with blood pressure regimen and statin.  No hypoglycemic episodes reported today.  She continues to follow-up with nephrology closely for anemia that seems to be associated with renal disease.  She is very reluctant to pursue any type of dialysis.  She has an appointment with nephrology soon regarding that nodule on the kidney that unfortunately cannot be evaluated by MRI yet due to the need for iron infusions.  Her hemoglobin level has been improving.  She continues to have low appetite and her daughter worries about her weight.  She has recently started putting her on Ensure  Last eye exam: Up-to-date Last foot exam: Up-to-date Last A1c:  Lab Results  Component Value Date   HGBA1C 6.5 (H) 04/17/2022   Nephropathy screen indicated?:  May have gotten with nephrology recently? Last flu, zoster and/or pneumovax:  Immunization History  Administered Date(s) Administered   Fluad Quad(high Dose 65+) 04/17/2022   Influenza Split 04/05/2010   Influenza, High Dose Seasonal PF 03/11/2018, 02/19/2019   Influenza,inj,Quad PF,6+ Mos 03/18/2016, 03/20/2017   Influenza-Unspecified 03/02/2021   Moderna Sars-Covid-2 Vaccination 07/24/2019, 08/21/2019, 05/11/2020   Pneumococcal Conjugate-13 04/21/2016   Pneumococcal Polysaccharide-23 04/05/2010, 08/14/2017, 01/03/2018   Tdap 11/05/2009   Zoster Recombinat (Shingrix) 01/03/2018, 03/11/2018    2.  Recurrent epistaxis Patient's daughter reports that she has had recurrent epistaxis roughly at least 1 time per week lasting at least 5 minutes per episode over the last several months.  She does use Flonase nasal spray.  Being treated for anemia as above.  Would like ENT referral.  Also notes that she thinks her ears are full   ROS:  Per HPI  Allergies  Allergen Reactions   Iodinated Contrast Media     Unknown reaction    Latex     Unknown reaction    Lisinopril Swelling    lips   Shellfish Allergy     Unknown Reaction    Past Medical History:  Diagnosis Date   Anemia    Arthritis    Cataract    Coronary artery disease    Mild plaque 2012   Diabetes mellitus    Essential hypertension 02/10/2012   GERD (gastroesophageal reflux disease)    Hyperlipidemia    Hypertension    Neuropathy    Peptic ulcer    Rheumatoid arthritis (HCC)    TIA (transient ischemic attack) 02/10/2012    Current Outpatient Medications:    Accu-Chek Softclix Lancets lancets, CHECK BLOOD SUGAR TWICE A DAY AND AS NEEDED Dx E11.21, Disp: 200 each, Rfl: 3   amLODipine (NORVASC) 2.5 MG tablet, Take 1 tablet (2.5 mg total) by mouth daily., Disp: 90 tablet, Rfl: 0   aspirin EC 81 MG tablet, Take 81 mg by mouth daily., Disp: , Rfl:    atorvastatin (LIPITOR) 10 MG tablet, TAKE 1 TABLET DAILY, Disp: 90 tablet, Rfl: 0   Cholecalciferol 125 MCG (5000 UT) TABS, Take 5,000 Units by mouth daily., Disp: , Rfl:    clobetasol (TEMOVATE) 0.05 % external solution, Apply 1 Application topically daily as needed (scalp irriration)., Disp: 50 mL, Rfl: 1   Continuous Blood Gluc Sensor ([name] 2 SENSOR) MISC, USE TO TEST BLOOD SUGAR CONTINUOUSLY. Dx: E11.65. ADVANCED DIABETES SUPPLY VIA PARACHUTE, Disp: , Rfl:    fluticasone (FLONASE) 50 MCG/ACT  nasal spray, USE 2 SPRAYS IN EACH NOSTRIL ONCE DAILY, Disp: 16 g, Rfl: 6   folic acid (FOLVITE) 1 MG tablet, TAKE ONE TABLET DAILY, Disp: 90 tablet, Rfl: 0   insulin glargine (LANTUS SOLOSTAR) 100 UNIT/ML Solostar Pen, Inject 5-10 Units into the skin at bedtime., Disp: 15 mL, Rfl: 2   Omega-3 Fatty Acids (FISH OIL) 1000 MG CAPS, Take 1,000 mg by mouth daily., Disp: , Rfl:    pantoprazole (PROTONIX) 40 MG tablet, TAKE ONE TABLET DAILY, Disp: 90 tablet, Rfl: 0 Social History   Socioeconomic History   Marital  status: Widowed    Spouse name: Not on file   Number of children: 3   Years of education: 24   Highest education level: High school graduate  Occupational History   Occupation: retired  Tobacco Use   Smoking status: Former    Years: 25    Types: Cigarettes, E-cigarettes    Quit date: 02/02/2013    Years since quitting: 9.7   Smokeless tobacco: Never  Vaping Use   Vaping Use: Never used  Substance and Sexual Activity   Alcohol use: No   Drug use: No   Sexual activity: Not Currently    Birth control/protection: Post-menopausal  Other Topics Concern   Not on file  Social History Narrative   Daughter stays with her.    Son comes daily when daughter is working   Chemical engineer Strain: Low Risk  (04/30/2022)   Overall Financial Resource Strain (CARDIA)    Difficulty of Paying Living Expenses: Not very hard  Food Insecurity: No Food Insecurity (04/30/2022)   Hunger Vital Sign    Worried About Running Out of Food in the Last Year: Never true    Ran Out of Food in the Last Year: Never true  Transportation Needs: Unmet Transportation Needs (02/01/2022)   PRAPARE - Administrator, Civil Service (Medical): Yes    Lack of Transportation (Non-Medical): Yes  Physical Activity: Inactive (01/15/2022)   Exercise Vital Sign    Days of Exercise per Week: 0 days    Minutes of Exercise per Session: 0 min  Stress: Stress Concern Present (01/15/2022)   [employer] of Occupational Health - Occupational Stress Questionnaire    Feeling of Stress : To some extent  Social Connections: Socially Isolated (03/08/2021)   Social Connection and Isolation Panel [NHANES]    Frequency of Communication with Friends and Family: More than three times a week    Frequency of Social Gatherings with Friends and Family: Once a week    Attends Religious Services: Never    Database administrator or Organizations: No    Attends Banker Meetings: Never     Marital Status: Widowed  Intimate Partner Violence: Not At Risk (02/01/2022)   Humiliation, Afraid, Rape, and Kick questionnaire    Fear of Current or Ex-Partner: No    Emotionally Abused: No    Physically Abused: No    Sexually Abused: No   Family History  Problem Relation Age of Onset   Heart disease Sister        Congeital (died age 90) with "enlarged heart"   CAD Sister    Diabetes Mother    Heart disease Mother        Later onset heart disease    Dementia Mother    Alcohol abuse Father    Liver disease Father    CAD Sister 68  CABG   Early death Sister    Diabetes Brother    Kidney disease Brother        related to DM   Diabetes Brother     Objective: Office vital signs reviewed. BP 131/70   Pulse 63   Temp 98.5 F (36.9 C)   Ht 5\' 7"  (1.702 m)   Wt 115 lb (52.2 kg)   SpO2 99%   BMI 18.01 kg/m   Physical Examination:  General: Awake, alert, chronically ill-appearing underweight female, No acute distress HEENT: sclera white, MMM.  Soft appearing cerumen obscuring bilateral tympanic membranes. Cardio: regular rate and rhythm, S1S2 heard, no murmurs appreciated Pulm: clear to auscultation bilaterally, no wheezes, rhonchi or rales; normal work of breathing on room air Extremities: No edema present  Assessment/ Plan: 77 y.o. 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   No additional treatments are needed at this time.  Needs urine microalbumin I have reached out to her nephrologist as I know that they typically collect these at least once yearly and do not want to duplicate testing and add cost to the patient.  She has improving hemoglobin and this is likely secondary to iron infusions.  I did see if she might be able to bring a FOBT back to [place] to ensure that there is no fecal loss.  Blood pressure is controlled for age.  Borderline given advanced renal disease.  She has an appointment to see urology soon.  Hopefully they can add input regarding that nodule in the kidney, which MRI has been put off to further evaluate due to iron infusions.  I have given a written prescription for Nepro given advanced renal disease.  May be safer to use something that is kidney specific over at the typical Ensure.  Agree that she is malnourished as this is evident by low albumin and BMI less than 19  Referral placed to ENT for epistaxis that is recurrent.  I did advise the patient to discontinue use of Flonase as this could be contributory.  May use nasal saline if needed for nasal congestion etc.  Irrigation of bilateral ears performed today  Orders Placed This Encounter  Procedures   Fecal occult blood, imunochemical([facility])   Bayer DCA Hb A1c Waived   Ambulatory referral to ENT    Referral Priority:   Routine    Referral Type:   Consultation    Referral Reason:   Specialty Services Required    Requested Specialty:   Otolaryngology    Number of Visits Requested:   1   No orders of the defined types were placed in this encounter.    Raliegh Ip, DO [facility] [phone]

## 2022-10-16 NOTE — Patient Instructions (Signed)
Referral to ENT STOP flonase. This might cause nose bleed.  Use nasal saline spray instead

## 2022-10-18 LAB — FECAL OCCULT BLOOD, IMMUNOCHEMICAL: Fecal Occult Bld: NEGATIVE

## 2022-10-25 ENCOUNTER — Encounter (HOSPITAL_COMMUNITY)
Admission: RE | Admit: 2022-10-25 | Discharge: 2022-10-25 | Disposition: A | Discharge status: Home / Self Care | Payer: Medicare Other | Source: Ambulatory Visit | Attending: Nephrology | Admitting: Nephrology

## 2022-10-25 VITALS — BP 162/75 | HR 71 | Temp 98.2°F | Resp 12

## 2022-10-25 DIAGNOSIS — N184 Chronic kidney disease, stage 4 (severe): Secondary | ICD-10-CM | POA: Insufficient documentation

## 2022-10-25 DIAGNOSIS — D631 Anemia in chronic kidney disease: Secondary | ICD-10-CM | POA: Insufficient documentation

## 2022-10-25 LAB — POCT HEMOGLOBIN-HEMACUE: Hemoglobin: 11.9 g/dL — ABNORMAL LOW (ref 12.0–15.0)

## 2022-10-25 MED ORDER — EPOETIN ALFA-EPBX 10000 UNIT/ML IJ SOLN
20000.0000 [IU] | Freq: Once | INTRAMUSCULAR | Status: AC
  Administered 2022-10-25: 20000 [IU] via SUBCUTANEOUS

## 2022-10-25 NOTE — Progress Notes (Signed)
Diagnosis: Anemia in Chronic Kidney Disease  Provider:  Annie Sable MD  Procedure: Injection  Retacrit (epoetin alfa-epbx), Dose: 20000 Units, Site: subcutaneous, Number of injections: 1  Hgb 11.9. Administered in abdomen.  Post Care: Patient declined observation  Discharge: Condition: Good, Destination: Home . AVS Provided  Performed by:  Marin Shutter, RN

## 2022-11-02 ENCOUNTER — Ambulatory Visit: Payer: Medicare Other | Admitting: Urology

## 2022-11-02 ENCOUNTER — Encounter: Payer: Self-pay | Admitting: Urology

## 2022-11-02 VITALS — BP 180/91 | HR 80

## 2022-11-02 DIAGNOSIS — N2889 Other specified disorders of kidney and ureter: Secondary | ICD-10-CM | POA: Diagnosis not present

## 2022-11-02 LAB — MICROSCOPIC EXAMINATION: Epithelial Cells (non renal): >10 /hpf — AB (ref 0–10)

## 2022-11-02 LAB — URINALYSIS, ROUTINE W REFLEX MICROSCOPIC
Bilirubin, UA: NEGATIVE
Glucose, UA: NEGATIVE
Ketones, UA: NEGATIVE
Nitrite, UA: NEGATIVE
Specific Gravity, UA: 1.02 (ref 1.005–1.030)
Urobilinogen, Ur: 0.2 mg/dL (ref 0.2–1.0)
pH, UA: 5.5 (ref 5.0–7.5)

## 2022-11-02 NOTE — Progress Notes (Unsigned)
11/02/2022 9:47 AM   Erica Crosby November 28, 1945 [id number]  Referring provider: Raliegh Ip, DO [address] [place],  [place] [address]  Left renal mass   HPI: Erica Crosby is a 76yo here for evaluation of left renal mass. She was diagnosed was a 2.8cm left renal mass on CT at [facility] in 01/2022. She has isseus with anemia.She has CKD and Creatinine is 2.4-3.5.    PMH: Past Medical History:  Diagnosis Date   Anemia    Arthritis    Cataract    Coronary artery disease    Mild plaque 2012   Diabetes mellitus    Essential hypertension 02/10/2012   GERD (gastroesophageal reflux disease)    Hyperlipidemia    Hypertension    Neuropathy    Peptic ulcer    Rheumatoid arthritis (HCC)    TIA (transient ischemic attack) 02/10/2012    Surgical History: Past Surgical History:  Procedure Laterality Date   ABSCESS DRAINAGE     CARPAL TUNNEL RELEASE Left    CHOLECYSTECTOMY     KNEE SURGERY Right    NECK SURGERY     x 2   SHOULDER SURGERY Left    TEE WITHOUT CARDIOVERSION N/A 03/12/2022   Procedure: TRANSESOPHAGEAL ECHOCARDIOGRAM (TEE);  Surgeon: Thurmon Fair, MD;  Location: [facility] ENDOSCOPY;  Service: Cardiovascular;  Laterality: N/A;    Home Medications:  Allergies as of 11/02/2022       Reactions   Iodinated Contrast Media    Unknown reaction    Latex    Unknown reaction    Lisinopril Swelling   lips   Shellfish Allergy    Unknown Reaction         Medication List        Accurate as of Nov 02, 2022  9:47 AM. If you have any questions, ask your nurse or doctor.          Accu-Chek Softclix Lancets lancets CHECK BLOOD SUGAR TWICE A DAY AND AS NEEDED Dx E11.21   amLODipine 2.5 MG tablet Commonly known as: NORVASC Take 1 tablet (2.5 mg total) by mouth daily.   aspirin EC 81 MG tablet Take 81 mg by mouth daily.   atorvastatin 10 MG tablet Commonly known as: LIPITOR TAKE 1 TABLET DAILY   Cholecalciferol 125 MCG (5000 UT) Tabs Take 5,000 Units by  mouth daily.   clobetasol 0.05 % external solution Commonly known as: TEMOVATE Apply 1 Application topically daily as needed (scalp irriration).   Fish Oil 1000 MG Caps Take 1,000 mg by mouth daily.   fluticasone 50 MCG/ACT nasal spray Commonly known as: FLONASE USE 2 SPRAYS IN EACH NOSTRIL ONCE DAILY   folic acid 1 MG tablet Commonly known as: FOLVITE TAKE ONE TABLET DAILY   [name] 2 Sensor Misc USE TO TEST BLOOD SUGAR CONTINUOUSLY. Dx: E11.65. ADVANCED DIABETES SUPPLY VIA PARACHUTE   Lantus SoloStar 100 UNIT/ML Solostar Pen Generic drug: insulin glargine Inject 5-10 Units into the skin at bedtime.   pantoprazole 40 MG tablet Commonly known as: PROTONIX TAKE ONE TABLET DAILY        Allergies:  Allergies  Allergen Reactions   Iodinated Contrast Media     Unknown reaction    Latex     Unknown reaction    Lisinopril Swelling    lips   Shellfish Allergy     Unknown Reaction     Family History: Family History  Problem Relation Age of Onset   Heart disease Sister  Congeital (died age 63) with "enlarged heart"   CAD Sister    Diabetes Mother    Heart disease Mother        Later onset heart disease    Dementia Mother    Alcohol abuse Father    Liver disease Father    CAD Sister 3       CABG   Early death Sister    Diabetes Brother    Kidney disease Brother        related to DM   Diabetes Brother     Social History:  reports that she quit smoking about 9 years ago. Her smoking use included cigarettes and e-cigarettes. She has never used smokeless tobacco. She reports that she does not drink alcohol and does not use drugs.  ROS: All other review of systems were reviewed and are negative except what is noted above in HPI  Physical Exam: BP (!) 180/91   Pulse 80   Constitutional:  Alert and oriented, No acute distress. HEENT: [place] AT, moist mucus membranes.  Trachea midline, no masses. Cardiovascular: No clubbing, cyanosis, or  edema. Respiratory: Normal respiratory effort, no increased work of breathing. GI: Abdomen is soft, nontender, nondistended, no abdominal masses GU: No CVA tenderness.  Lymph: No cervical or inguinal lymphadenopathy. Skin: No rashes, bruises or suspicious lesions. Neurologic: Grossly intact, no focal deficits, moving all 4 extremities. Psychiatric: Normal mood and affect.  Laboratory Data: Lab Results  Component Value Date   WBC 5.7 07/19/2022   HGB 11.9 (L) 10/25/2022   HCT 22.8 (L) 07/19/2022   MCV 93.4 07/19/2022   PLT 252 07/19/2022    Lab Results  Component Value Date   CREATININE 3.54 (H) 10/11/2022    No results found for: "PSA"  No results found for: "TESTOSTERONE"  Lab Results  Component Value Date   HGBA1C 6.5 (H) 10/16/2022    Urinalysis    Component Value Date/Time   COLORURINE YELLOW 05/05/2021 1336   APPEARANCEUR CLEAR 05/05/2021 1336   APPEARANCEUR Hazy (A) 05/02/2017 1724   LABSPEC 1.010 05/05/2021 1336   PHURINE 5.0 05/05/2021 1336   GLUCOSEU NEGATIVE 05/05/2021 1336   HGBUR SMALL (A) 05/05/2021 1336   BILIRUBINUR NEGATIVE 05/05/2021 1336   BILIRUBINUR Negative 05/02/2017 1724   KETONESUR NEGATIVE 05/05/2021 1336   PROTEINUR 100 (A) 05/05/2021 1336   UROBILINOGEN 1.0 02/10/2012 0817   NITRITE NEGATIVE 05/05/2021 1336   LEUKOCYTESUR NEGATIVE 05/05/2021 1336    Lab Results  Component Value Date   LABMICR 1,457.2 10/14/2020   WBCUA 0-5 05/02/2017   RBCUA 0-2 05/02/2017   LABEPIT 0-10 05/02/2017   MUCUS Present 05/02/2017   BACTERIA RARE (A) 05/05/2021    Pertinent Imaging: Ct 02/07/2022: Images reviewed and discussed with the patient No results found for this or any previous visit.  No results found for this or any previous visit.  No results found for this or any previous visit.  No results found for this or any previous visit.  No results found for this or any previous visit.  No valid procedures specified. No results found for  this or any previous visit.  No results found for this or any previous visit.   Assessment & Plan:    1. Nodule of kidney -Renal US  - Urinalysis, Routine w reflex microscopic   No follow-ups on file.  Wilkie Aye, MD  [facility] [place]

## 2022-11-02 NOTE — Patient Instructions (Signed)

## 2022-11-05 DIAGNOSIS — N184 Chronic kidney disease, stage 4 (severe): Secondary | ICD-10-CM | POA: Diagnosis not present

## 2022-11-08 ENCOUNTER — Encounter (HOSPITAL_COMMUNITY)
Admission: RE | Admit: 2022-11-08 | Discharge: 2022-11-08 | Disposition: A | Discharge status: Home / Self Care | Payer: Medicare Other | Source: Ambulatory Visit | Attending: Nephrology | Admitting: Nephrology

## 2022-11-08 VITALS — BP 135/67 | HR 73 | Temp 98.4°F | Resp 16

## 2022-11-08 DIAGNOSIS — N184 Chronic kidney disease, stage 4 (severe): Secondary | ICD-10-CM | POA: Diagnosis not present

## 2022-11-08 DIAGNOSIS — D631 Anemia in chronic kidney disease: Secondary | ICD-10-CM

## 2022-11-08 LAB — FERRITIN: Ferritin: 131 ng/mL (ref 11–307)

## 2022-11-08 LAB — RENAL FUNCTION PANEL
Albumin: 2.9 g/dL — ABNORMAL LOW (ref 3.5–5.0)
Anion gap: 10 (ref 5–15)
BUN: 55 mg/dL — ABNORMAL HIGH (ref 8–23)
CO2: 20 mmol/L — ABNORMAL LOW (ref 22–32)
Calcium: 8.5 mg/dL — ABNORMAL LOW (ref 8.9–10.3)
Chloride: 106 mmol/L (ref 98–111)
Creatinine, Ser: 3.02 mg/dL — ABNORMAL HIGH (ref 0.44–1.00)
GFR, Estimated: 16 mL/min — ABNORMAL LOW (ref >60)
Glucose, Bld: 188 mg/dL — ABNORMAL HIGH (ref 70–99)
Phosphorus: 4.7 mg/dL — ABNORMAL HIGH (ref 2.5–4.6)
Potassium: 4.1 mmol/L (ref 3.5–5.1)
Sodium: 136 mmol/L (ref 135–145)

## 2022-11-08 LAB — POCT HEMOGLOBIN-HEMACUE: Hemoglobin: 12.3 g/dL (ref 12.0–15.0)

## 2022-11-08 LAB — IRON AND TIBC
Iron: 43 ug/dL (ref 28–170)
Saturation Ratios: 21 % (ref 10.4–31.8)
TIBC: 209 ug/dL — ABNORMAL LOW (ref 250–450)
UIBC: 166 ug/dL

## 2022-11-08 MED ORDER — CLONIDINE HCL 0.1 MG PO TABS: 0.1000 mg | ORAL_TABLET | ORAL | Status: DC | PRN

## 2022-11-08 MED ORDER — EPOETIN ALFA-EPBX 10000 UNIT/ML IJ SOLN: 20000.0000 [IU] | Freq: Once | INTRAMUSCULAR | Status: DC

## 2022-11-08 NOTE — Progress Notes (Signed)
Diagnosis: Anemia in Chronic Kidney Disease  Provider:  Annie Sable MD  Procedure: Injection  Retacrit (epoetin alfa-epbx), Dose: 20000 Units, Site: subcutaneous, Number of injections: 0  Hgb 12.3. Injection not administered.  Post Care: Patient declined observation  Discharge: Condition: Good, Destination: Home . AVS Provided  Performed by:  Wyvonne Lenz, RN

## 2022-11-10 LAB — PARATHYROID HORMONE, INTACT (NO CA): PTH: 38 pg/mL (ref 15–65)

## 2022-11-13 ENCOUNTER — Other Ambulatory Visit: Payer: Self-pay | Admitting: Family Medicine

## 2022-11-16 ENCOUNTER — Ambulatory Visit (INDEPENDENT_AMBULATORY_CARE_PROVIDER_SITE_OTHER): Payer: Medicare Other

## 2022-11-16 VITALS — Wt 115.0 lb

## 2022-11-16 DIAGNOSIS — Z0000 Encounter for general adult medical examination without abnormal findings: Secondary | ICD-10-CM | POA: Diagnosis not present

## 2022-11-16 NOTE — Patient Instructions (Signed)
Erica Crosby , Thank you for taking time to come for your Medicare Wellness Visit. I appreciate your ongoing commitment to your health goals. Please review the following plan we discussed and let me know if I can assist you in the future.   These are the goals we discussed:  Goals       Patient Stated     T2DM PHARMD (pt-stated)      Current Barriers:  Unable to maintain control of T2DM-HAVING HYPOGLYCEMIA  Pharmacist Clinical Goal(s):  Over the next 90 days, patient will achieve adherence to monitoring guidelines and medication adherence to achieve therapeutic efficacy maintain control of T2DM as evidenced by STABLE BLOOD SUGARS-NO HYPOGLYCEMIA  through collaboration with PharmD and provider.    Interventions: 1:1 collaboration with Raliegh Ip, DO regarding development and update of comprehensive plan of care as evidenced by provider attestation and co-signature Inter-disciplinary care team collaboration (see longitudinal plan of care) Comprehensive medication review performed; medication list updated in electronic medical record  Diabetes: Uncontrolled; current treatment: LANTUS (holding) Eats dinner at 5-6pm Going low after that 60-70 OVERNIGHT COUNSELED PATIENT TO HAVE 4 0Z OF JUICE ON HAND, SHE IS OFTEN OVER CORRECTING AND REBOUNDING UP TO 200S SHE VERBALIZES UNDERSTANDING WROTE DOWN APPT FOR PCP IN 2 WEEKS FOR FOLLOW UP USING LIBRE 2 CGM PROVIDED BY [facility] HOLDING INSULIN Gfr 21/Urine mirco 1400 Reports hypoglycemic symptoms around 12am-1am Discussed meal planning options and Plate method for healthy eating Avoid sugary drinks and desserts Incorporate balanced protein, non starchy veggies, 1 serving of carbohydrate with each meal Increase water intake Increase physical activity as able Current exercise: n/a Counseled on holding insulin; continue to monitor Bgs with libre 2 CGM--encouraged patient to call if BG>200 or <70 persistently; she was educated  on treating hypoglyemcia   Patient Goals/Self-Care Activities Over the next 90 days, patient will:  - take medications as prescribed check glucose USING LIBRE 2 CMG, document, and provide at future appointments  Follow Up Plan: Telephone follow up appointment with care management team member scheduled for: NEXT MONTH       Other     DIET - INCREASE WATER INTAKE      Try to drink 6-8 glasses of water daily.      Have 3 meals a day        This is a list of the screening recommended for you and due dates:  Health Maintenance  Topic Date Due   Yearly kidney health urinalysis for diabetes  10/14/2021   COVID-19 Vaccine (4 - 2023-24 season) 12/02/2022*   Complete foot exam   12/16/2022   Hemoglobin A1C  04/17/2023   Eye exam for diabetics  04/27/2023   Yearly kidney function blood test for diabetes  11/08/2023   Medicare Annual Wellness Visit  11/16/2023   Colon Cancer Screening  08/14/2026   Pneumonia Vaccine  Completed   DEXA scan (bone density measurement)  Completed   Hepatitis C Screening  Completed   Zoster (Shingles) Vaccine  Completed   HPV Vaccine  Aged Out   DTaP/Tdap/Td vaccine  Discontinued   Flu Shot  Discontinued  *Topic was postponed. The date shown is not the original due date.    Advanced directives: Advance directive discussed with you today. Even though you declined this today, please call our office should you change your mind, and we can give you the proper paperwork for you to fill out.   Conditions/risks identified: Diabetes Mellitus and Nutrition, Adult When you  have diabetes, or diabetes mellitus, it is very important to have healthy eating habits because your blood sugar (glucose) levels are greatly affected by what you eat and drink. Eating healthy foods in the right amounts, at about the same times every day, can help you: Manage your blood glucose. Lower your risk of heart disease. Improve your blood pressure. Reach or maintain a healthy  weight. What can affect my meal plan? Every person with diabetes is different, and each person has different needs for a meal plan. Your health care provider may recommend that you work with a dietitian to make a meal plan that is best for you. Your meal plan may vary depending on factors such as: The calories you need. The medicines you take. Your weight. Your blood glucose, blood pressure, and cholesterol levels. Your activity level. Other health conditions you have, such as heart or kidney disease. How do carbohydrates affect me? Carbohydrates, also called carbs, affect your blood glucose level more than any other type of food. Eating carbs raises the amount of glucose in your blood. It is important to know how many carbs you can safely have in each meal. This is different for every person. Your dietitian can help you calculate how many carbs you should have at each meal and for each snack. How does alcohol affect me? Alcohol can cause a decrease in blood glucose (hypoglycemia), especially if you use insulin or take certain diabetes medicines by mouth. Hypoglycemia can be a life-threatening condition. Symptoms of hypoglycemia, such as sleepiness, dizziness, and confusion, are similar to symptoms of having too much alcohol. Do not drink alcohol if: Your health care provider tells you not to drink. You are pregnant, may be pregnant, or are planning to become pregnant. If you drink alcohol: Limit how much you have to: 0-1 drink a day for women. 0-2 drinks a day for men. Know how much alcohol is in your drink. In the [place] one drink equals one 12 oz bottle of beer (355 mL), one 5 oz glass of wine (148 mL), or one 1 oz glass of hard liquor (44 mL). Keep yourself hydrated with water, diet soda, or unsweetened iced tea. Keep in mind that regular soda, juice, and other mixers may contain a lot of sugar and must be counted as carbs. What are tips for following this plan?  Reading food  labels Start by checking the serving size on the Nutrition Facts label of packaged foods and drinks. The number of calories and the amount of carbs, fats, and other nutrients listed on the label are based on one serving of the item. Many items contain more than one serving per package. Check the total grams (g) of carbs in one serving. Check the number of grams of saturated fats and trans fats in one serving. Choose foods that have a low amount or none of these fats. Check the number of milligrams (mg) of salt (sodium) in one serving. Most people should limit total sodium intake to less than 2,300 mg per day. Always check the nutrition information of foods labeled as "low-fat" or "nonfat." These foods may be higher in added sugar or refined carbs and should be avoided. Talk to your dietitian to identify your daily goals for nutrients listed on the label. Shopping Avoid buying canned, pre-made, or processed foods. These foods tend to be high in fat, sodium, and added sugar. Shop around the outside edge of the grocery store. This is where you will most often find fresh fruits  and vegetables, bulk grains, fresh meats, and fresh dairy products. Cooking Use low-heat cooking methods, such as baking, instead of high-heat cooking methods, such as deep frying. Cook using healthy oils, such as olive, canola, or sunflower oil. Avoid cooking with butter, cream, or high-fat meats. Meal planning Eat meals and snacks regularly, preferably at the same times every day. Avoid going long periods of time without eating. Eat foods that are high in fiber, such as fresh fruits, vegetables, beans, and whole grains. Eat 4-6 oz (112-168 g) of lean protein each day, such as lean meat, chicken, fish, eggs, or tofu. One ounce (oz) (28 g) of lean protein is equal to: 1 oz (28 g) of meat, chicken, or fish. 1 egg.  cup (62 g) of tofu. Eat some foods each day that contain healthy fats, such as avocado, nuts, seeds, and  fish. What foods should I eat? Fruits Berries. Apples. Oranges. Peaches. Apricots. Plums. Grapes. Mangoes. Papayas. Pomegranates. Kiwi. Cherries. Vegetables Leafy greens, including lettuce, spinach, kale, chard, collard greens, mustard greens, and cabbage. Beets. Cauliflower. Broccoli. Carrots. Green beans. Tomatoes. Peppers. Onions. Cucumbers. Brussels sprouts. Grains Whole grains, such as whole-wheat or whole-grain bread, crackers, tortillas, cereal, and pasta. Unsweetened oatmeal. Quinoa. Brown or wild rice. Meats and other proteins Seafood. Poultry without skin. Lean cuts of poultry and beef. Tofu. Nuts. Seeds. Dairy Low-fat or fat-free dairy products such as milk, yogurt, and cheese. The items listed above may not be a complete list of foods and beverages you can eat and drink. Contact a dietitian for more information. What foods should I avoid? Fruits Fruits canned with syrup. Vegetables Canned vegetables. Frozen vegetables with butter or cream sauce. Grains Refined white flour and flour products such as bread, pasta, snack foods, and cereals. Avoid all processed foods. Meats and other proteins Fatty cuts of meat. Poultry with skin. Breaded or fried meats. Processed meat. Avoid saturated fats. Dairy Full-fat yogurt, cheese, or milk. Beverages Sweetened drinks, such as soda or iced tea. The items listed above may not be a complete list of foods and beverages you should avoid. Contact a dietitian for more information. Questions to ask a health care provider Do I need to meet with a certified diabetes care and education specialist? Do I need to meet with a dietitian? What number can I call if I have questions? When are the best times to check my blood glucose? Where to find more information: American Diabetes Association: diabetes.org Academy of Nutrition and Dietetics: eatright.[employer] of Diabetes and Digestive and Kidney Diseases: StageSync.si Association of  Diabetes Care & Education Specialists: diabeteseducator.org Summary It is important to have healthy eating habits because your blood sugar (glucose) levels are greatly affected by what you eat and drink. It is important to use alcohol carefully. A healthy meal plan will help you manage your blood glucose and lower your risk of heart disease. Your health care provider may recommend that you work with a dietitian to make a meal plan that is best for you. This information is not intended to replace advice given to you by your health care provider. Make sure you discuss any questions you have with your health care provider. Document Revised: 01/06/2020 Document Reviewed: 01/06/2020 Elsevier Patient Education  2024 Elsevier Inc.   Next appointment: Follow up in one year for your annual wellness visit 11/16/23   Preventive Care 65 Years and Older, Female Preventive care refers to lifestyle choices and visits with your health care provider that can promote health and  wellness. What does preventive care include? A yearly physical exam. This is also called an annual well check. Dental exams once or twice a year. Routine eye exams. Ask your health care provider how often you should have your eyes checked. Personal lifestyle choices, including: Daily care of your teeth and gums. Regular physical activity. Eating a healthy diet. Avoiding tobacco and drug use. Limiting alcohol use. Practicing safe sex. Taking low-dose aspirin every day. Taking vitamin and mineral supplements as recommended by your health care provider. What happens during an annual well check? The services and screenings done by your health care provider during your annual well check will depend on your age, overall health, lifestyle risk factors, and family history of disease. Counseling  Your health care provider may ask you questions about your: Alcohol use. Tobacco use. Drug use. Emotional well-being. Home and  relationship well-being. Sexual activity. Eating habits. History of falls. Memory and ability to understand (cognition). Work and work Astronomer. Reproductive health. Screening  You may have the following tests or measurements: Height, weight, and BMI. Blood pressure. Lipid and cholesterol levels. These may be checked every 5 years, or more frequently if you are over 76 years old. Skin check. Lung cancer screening. You may have this screening every year starting at age 52 if you have a 30-pack-year history of smoking and currently smoke or have quit within the past 15 years. Fecal occult blood test (FOBT) of the stool. You may have this test every year starting at age 54. Flexible sigmoidoscopy or colonoscopy. You may have a sigmoidoscopy every 5 years or a colonoscopy every 10 years starting at age 91. Hepatitis C blood test. Hepatitis B blood test. Sexually transmitted disease (STD) testing. Diabetes screening. This is done by checking your blood sugar (glucose) after you have not eaten for a while (fasting). You may have this done every 1-3 years. Bone density scan. This is done to screen for osteoporosis. You may have this done starting at age 74. Mammogram. This may be done every 1-2 years. Talk to your health care provider about how often you should have regular mammograms. Talk with your health care provider about your test results, treatment options, and if necessary, the need for more tests. Vaccines  Your health care provider may recommend certain vaccines, such as: Influenza vaccine. This is recommended every year. Tetanus, diphtheria, and acellular pertussis (Tdap, Td) vaccine. You may need a Td booster every 10 years. Zoster vaccine. You may need this after age 52. Pneumococcal 13-valent conjugate (PCV13) vaccine. One dose is recommended after age 55. Pneumococcal polysaccharide (PPSV23) vaccine. One dose is recommended after age 34. Talk to your health care provider  about which screenings and vaccines you need and how often you need them. This information is not intended to replace advice given to you by your health care provider. Make sure you discuss any questions you have with your health care provider. Document Released: 07/01/2015 Document Revised: 02/22/2016 Document Reviewed: 04/05/2015 Elsevier Interactive Patient Education  2017 [employer].  Fall Prevention in the Home Falls can cause injuries. They can happen to people of all ages. There are many things you can do to make your home safe and to help prevent falls. What can I do on the outside of my home? Regularly fix the edges of walkways and driveways and fix any cracks. Remove anything that might make you trip as you walk through a door, such as a raised step or threshold. Trim any bushes or trees on  the path to your home. Use bright outdoor lighting. Clear any walking paths of anything that might make someone trip, such as rocks or tools. Regularly check to see if handrails are loose or broken. Make sure that both sides of any steps have handrails. Any raised decks and porches should have guardrails on the edges. Have any leaves, snow, or ice cleared regularly. Use sand or salt on walking paths during winter. Clean up any spills in your garage right away. This includes oil or grease spills. What can I do in the bathroom? Use night lights. Install grab bars by the toilet and in the tub and shower. Do not use towel bars as grab bars. Use non-skid mats or decals in the tub or shower. If you need to sit down in the shower, use a plastic, non-slip stool. Keep the floor dry. Clean up any water that spills on the floor as soon as it happens. Remove soap buildup in the tub or shower regularly. Attach bath mats securely with double-sided non-slip rug tape. Do not have throw rugs and other things on the floor that can make you trip. What can I do in the bedroom? Use night lights. Make sure  that you have a light by your bed that is easy to reach. Do not use any sheets or blankets that are too big for your bed. They should not hang down onto the floor. Have a firm chair that has side arms. You can use this for support while you get dressed. Do not have throw rugs and other things on the floor that can make you trip. What can I do in the kitchen? Clean up any spills right away. Avoid walking on wet floors. Keep items that you use a lot in easy-to-reach places. If you need to reach something above you, use a strong step stool that has a grab bar. Keep electrical cords out of the way. Do not use floor polish or wax that makes floors slippery. If you must use wax, use non-skid floor wax. Do not have throw rugs and other things on the floor that can make you trip. What can I do with my stairs? Do not leave any items on the stairs. Make sure that there are handrails on both sides of the stairs and use them. Fix handrails that are broken or loose. Make sure that handrails are as long as the stairways. Check any carpeting to make sure that it is firmly attached to the stairs. Fix any carpet that is loose or worn. Avoid having throw rugs at the top or bottom of the stairs. If you do have throw rugs, attach them to the floor with carpet tape. Make sure that you have a light switch at the top of the stairs and the bottom of the stairs. If you do not have them, ask someone to add them for you. What else can I do to help prevent falls? Wear shoes that: Do not have high heels. Have rubber bottoms. Are comfortable and fit you well. Are closed at the toe. Do not wear sandals. If you use a stepladder: Make sure that it is fully opened. Do not climb a closed stepladder. Make sure that both sides of the stepladder are locked into place. Ask someone to hold it for you, if possible. Clearly mark and make sure that you can see: Any grab bars or handrails. First and last steps. Where the edge of  each step is. Use tools that help you move around (  mobility aids) if they are needed. These include: Canes. Walkers. Scooters. Crutches. Turn on the lights when you go into a dark area. Replace any light bulbs as soon as they burn out. Set up your furniture so you have a clear path. Avoid moving your furniture around. If any of your floors are uneven, fix them. If there are any pets around you, be aware of where they are. Review your medicines with your doctor. Some medicines can make you feel dizzy. This can increase your chance of falling. Ask your doctor what other things that you can do to help prevent falls. This information is not intended to replace advice given to you by your health care provider. Make sure you discuss any questions you have with your health care provider. Document Released: 03/31/2009 Document Revised: 11/10/2015 Document Reviewed: 07/09/2014 Elsevier Interactive Patient Education  2017 [employer].

## 2022-11-16 NOTE — Progress Notes (Unsigned)
Subjective:   Erica Crosby is a 77 y.o. female who presents for Medicare Annual (Subsequent) preventive examination.  Review of Systems    I connected with  KALIEA PRAYTOR on 11/16/22 by a audio enabled telemedicine application and verified that I am speaking with the correct person using two identifiers.  Patient Location: Home  Provider Location: Home Office  I discussed the limitations of evaluation and management by telemedicine. The patient expressed understanding and agreed to proceed.  Cardiac Risk Factors include: advanced age (>73men, >53 women);hypertension     Objective:    Today's Vitals   11/16/22 1533  Weight: 115 lb (52.2 kg)   Body mass index is 18.01 kg/m.     11/16/2022    3:48 PM 03/12/2022    9:04 AM 02/01/2022    5:33 PM 03/08/2021    2:20 PM 03/01/2020    2:08 PM 05/21/2019    7:25 PM 02/10/2019    3:52 PM  Advanced Directives  Does Patient Have a Medical Advance Directive? No No No No No No No  Would patient like information on creating a medical advance directive? No - Patient declined No - Patient declined No - Patient declined No - Patient declined No - Patient declined No - Patient declined No - Patient declined    Current Medications (verified) Outpatient Encounter Medications as of 11/16/2022  Medication Sig   Accu-Chek Softclix Lancets lancets CHECK BLOOD SUGAR TWICE A DAY AND AS NEEDED Dx E11.21   amLODipine (NORVASC) 2.5 MG tablet Take 1 tablet (2.5 mg total) by mouth daily.   aspirin EC 81 MG tablet Take 81 mg by mouth daily.   atorvastatin (LIPITOR) 10 MG tablet TAKE 1 TABLET DAILY   Cholecalciferol 125 MCG (5000 UT) TABS Take 5,000 Units by mouth daily.   clobetasol (TEMOVATE) 0.05 % external solution Apply 1 Application topically daily as needed (scalp irriration).   Continuous Blood Gluc Sensor ([name] 2 SENSOR) MISC USE TO TEST BLOOD SUGAR CONTINUOUSLY. Dx: E11.65. ADVANCED DIABETES SUPPLY VIA PARACHUTE   fluticasone (FLONASE) 50  MCG/ACT nasal spray USE 2 SPRAYS IN EACH NOSTRIL ONCE DAILY   folic acid (FOLVITE) 1 MG tablet TAKE ONE TABLET DAILY   insulin glargine (LANTUS SOLOSTAR) 100 UNIT/ML Solostar Pen Inject 5-10 Units into the skin at bedtime.   Omega-3 Fatty Acids (FISH OIL) 1000 MG CAPS Take 1,000 mg by mouth daily.   pantoprazole (PROTONIX) 40 MG tablet TAKE ONE TABLET DAILY   No facility-administered encounter medications on file as of 11/16/2022.    Allergies (verified) Iodinated contrast media, Latex, Lisinopril, and Shellfish allergy   History: Past Medical History:  Diagnosis Date   Anemia    Arthritis    Cataract    Coronary artery disease    Mild plaque 2012   Diabetes mellitus    Essential hypertension 02/10/2012   GERD (gastroesophageal reflux disease)    Hyperlipidemia    Hypertension    Neuropathy    Peptic ulcer    Rheumatoid arthritis (HCC)    TIA (transient ischemic attack) 02/10/2012   Past Surgical History:  Procedure Laterality Date   ABSCESS DRAINAGE     CARPAL TUNNEL RELEASE Left    CHOLECYSTECTOMY     KNEE SURGERY Right    NECK SURGERY     x 2   SHOULDER SURGERY Left    TEE WITHOUT CARDIOVERSION N/A 03/12/2022   Procedure: TRANSESOPHAGEAL ECHOCARDIOGRAM (TEE);  Surgeon: Thurmon Fair, MD;  Location: [facility];  Service: Cardiovascular;  Laterality: N/A;   Family History  Problem Relation Age of Onset   Heart disease Sister        Congeital (died age 14) with "enlarged heart"   CAD Sister    Diabetes Mother    Heart disease Mother        Later onset heart disease    Dementia Mother    Alcohol abuse Father    Liver disease Father    CAD Sister 82       CABG   Early death Sister    Diabetes Brother    Kidney disease Brother        related to DM   Diabetes Brother    Social History   Socioeconomic History   Marital status: Widowed    Spouse name: Not on file   Number of children: 3   Years of education: 40   Highest education level: High school  graduate  Occupational History   Occupation: retired  Tobacco Use   Smoking status: Former    Years: 25    Types: Cigarettes, E-cigarettes    Quit date: 02/02/2013    Years since quitting: 9.7   Smokeless tobacco: Never  Vaping Use   Vaping Use: Never used  Substance and Sexual Activity   Alcohol use: No   Drug use: No   Sexual activity: Not Currently    Birth control/protection: Post-menopausal  Other Topics Concern   Not on file  Social History Narrative   Daughter stays with her.    Son comes daily when daughter is working   Chemical engineer Strain: Low Risk  (11/16/2022)   Overall Financial Resource Strain (CARDIA)    Difficulty of Paying Living Expenses: Not hard at all  Food Insecurity: No Food Insecurity (11/16/2022)   Hunger Vital Sign    Worried About Running Out of Food in the Last Year: Never true    Ran Out of Food in the Last Year: Never true  Transportation Needs: Unmet Transportation Needs (11/16/2022)   PRAPARE - Administrator, Civil Service (Medical): Yes    Lack of Transportation (Non-Medical): No  Physical Activity: Insufficiently Active (11/16/2022)   Exercise Vital Sign    Days of Exercise per Week: 7 days    Minutes of Exercise per Session: 20 min  Stress: No Stress Concern Present (11/16/2022)   [employer] of Occupational Health - Occupational Stress Questionnaire    Feeling of Stress : Not at all  Social Connections: Moderately Integrated (11/16/2022)   Social Connection and Isolation Panel [NHANES]    Frequency of Communication with Friends and Family: Once a week    Frequency of Social Gatherings with Friends and Family: Once a week    Attends Religious Services: 1 to 4 times per year    Active Member of [employer] or Organizations: Yes    Attends Engineer, structural: More than 4 times per year    Marital Status: Married    Tobacco Counseling Counseling given: Yes   Clinical  Intake:  Pre-visit preparation completed: Yes  Pain : No/denies pain     BMI - recorded: 18.01 Nutritional Status: BMI <19  Underweight Nutritional Risks: None Diabetes: No  How often do you need to have someone help you when you read instructions, pamphlets, or other written materials from your doctor or pharmacy?: 1 - Never  Diabetic?YES  Interpreter Needed?: No  Information entered by :: Fredirick Maudlin  Activities of Daily Living    11/16/2022    3:50 PM  In your present state of health, do you have any difficulty performing the following activities:  Hearing? 0  Vision? 0  Difficulty concentrating or making decisions? 0  Walking or climbing stairs? 1  Dressing or bathing? 0  Doing errands, shopping? 1  Preparing Food and eating ? Y  Using the Toilet? N  In the past six months, have you accidently leaked urine? N  Do you have problems with loss of bowel control? N  Managing your Medications? N  Managing your Finances? Y  Housekeeping or managing your Housekeeping? Y    Patient Care Team: Raliegh Ip, DO as PCP - General (Family Medicine) Rollene Rotunda, MD as PCP - Cardiology (Cardiology) Rossie Muskrat, MD as Consulting Physician (Rheumatology) Roma Kayser, MD as Consulting Physician (Endocrinology) Annie Sable, MD as Consulting Physician (Nephrology) Danella Maiers, [facility] as Pharmacist (Family Medicine)  Indicate any recent Medical Services you may have received from other than [facility] providers in the past year (date may be approximate).     Assessment:   This is a routine wellness examination for Elna.  Hearing/Vision screen Hearing Screening - Comments:: Denies hearing difficulties   Vision Screening - Comments:: Wears rx glasses - up to date with routine eye exams with  [facility]   Dietary issues and exercise activities discussed: Current Exercise Habits: The patient does not participate in regular exercise  at present   Goals Addressed   None   Depression Screen    10/16/2022   11:02 AM 07/06/2022    3:20 PM 04/17/2022   10:30 AM 02/13/2022   11:02 AM 01/15/2022    3:59 PM 12/15/2021    1:04 PM 12/11/2021   10:53 AM  PHQ 2/9 Scores  PHQ - 2 Score 0 0 0 2 2 0 2  PHQ- 9 Score 0 4  7 7 2 8     Fall Risk    11/16/2022    3:50 PM 10/16/2022   11:02 AM 07/06/2022    3:20 PM 04/17/2022   10:30 AM 02/13/2022   11:02 AM  Fall Risk   Falls in the past year? 1 0 1 0 0  Number falls in past yr: 1 0 1    Injury with Fall? 1 0 1    Risk for fall due to : History of fall(s);Impaired balance/gait;Orthopedic patient No Fall Risks Impaired balance/gait;Impaired mobility    Follow up Education provided;Falls prevention discussed;Falls evaluation completed Education provided Education provided      FALL RISK PREVENTION PERTAINING TO THE HOME:  Any stairs in or around the home? Yes  If so, are there any without handrails? No  Home free of loose throw rugs in walkways, pet beds, electrical cords, etc? No  Adequate lighting in your home to reduce risk of falls? Yes   ASSISTIVE DEVICES UTILIZED TO PREVENT FALLS:  Life alert? No  Use of a cane, walker or w/c? Yes  Grab bars in the bathroom? No  Shower chair or bench in shower? Yes  Elevated toilet seat or a handicapped toilet? Yes   TIMED UP AND GO:  Was the test performed?  NO televisit  .   Cognitive Function:    08/14/2017    3:03 PM 08/22/2016    2:56 PM  MMSE - Mini Mental State Exam  Orientation to time 5 4  Orientation to Place 5 5  Registration 3 3  Attention/ Calculation 0 0  Attention/Calculation-comments not attempted   Recall 1 3  Language- name 2 objects 2 2  Language- repeat 1 1  Language- follow 3 step command 3 3  Language- read & follow direction 1 1  Write a sentence 1 1  Copy design 1 1  Total score 23 24        11/16/2022    3:38 PM 03/08/2021    2:19 PM 03/01/2020    1:57 PM 02/10/2019    4:02 PM  6CIT Screen   What Year? 0 points 0 points 0 points 0 points  What month? 0 points 0 points 0 points 0 points  What time? 0 points 0 points 0 points 0 points  Count back from 20 0 points 0 points 0 points 0 points  Months in reverse 2 points 4 points 4 points 0 points  Repeat phrase 2 points 8 points 2 points 6 points  Total Score 4 points 12 points 6 points 6 points    Immunizations Immunization History  Administered Date(s) Administered   Fluad Quad(high Dose 65+) 04/17/2022   Influenza Split 04/05/2010   Influenza, High Dose Seasonal PF 03/11/2018, 02/19/2019   Influenza,inj,Quad PF,6+ Mos 03/18/2016, 03/20/2017   Influenza-Unspecified 03/02/2021   Moderna Sars-Covid-2 Vaccination 07/24/2019, 08/21/2019, 05/11/2020   Pneumococcal Conjugate-13 04/21/2016   Pneumococcal Polysaccharide-23 04/05/2010, 08/14/2017, 01/03/2018   Tdap 11/05/2009   Zoster Recombinat (Shingrix) 01/03/2018, 03/11/2018    TDAP status: Up to date  Flu Vaccine status: Up to date  Pneumococcal vaccine status: Up to date  Covid-19 vaccine status: Information provided on how to obtain vaccines.   Qualifies for Shingles Vaccine? Yes   Zostavax completed Yes   Shingrix Completed?: Yes  Screening Tests Health Maintenance  Topic Date Due   Diabetic kidney evaluation - Urine ACR  10/14/2021   COVID-19 Vaccine (4 - 2023-24 season) 12/02/2022 (Originally 02/16/2022)   FOOT EXAM  12/16/2022   HEMOGLOBIN A1C  04/17/2023   OPHTHALMOLOGY EXAM  04/27/2023   Diabetic kidney evaluation - eGFR measurement  11/08/2023   Medicare Annual Wellness (AWV)  11/16/2023   Colonoscopy  08/14/2026   Pneumonia Vaccine 75+ Years old  Completed   DEXA SCAN  Completed   Hepatitis C Screening  Completed   Zoster Vaccines- Shingrix  Completed   HPV VACCINES  Aged Out   DTaP/Tdap/Td  Discontinued   INFLUENZA VACCINE  Discontinued    Health Maintenance  Health Maintenance Due  Topic Date Due   Diabetic kidney evaluation - Urine ACR   10/14/2021    Colorectal cancer screening: Type of screening: Colonoscopy. Completed 08/14/2016. Repeat every 10 years  Mammogram status: Completed 03/22/20. Repeat every year  Bone Density status: Completed 12/01/20. Results reflect: Bone density results: OSTEOPENIA. Repeat every 10 years.  Lung Cancer Screening: (Low Dose CT Chest recommended if Age 90-80 years, 30 pack-year currently smoking OR have quit w/in 15years.) does not qualify.   Lung Cancer Screening Referral: no  Additional Screening:  Hepatitis C Screening: does not qualify;age out   Vision Screening: Recommended annual ophthalmology exams for early detection of glaucoma and other disorders of the eye. Is the patient up to date with their annual eye exam?  Yes  Who is the provider or what is the name of the office in which the patient attends annual eye exams? [facility]  If pt is not established with a provider, would they like to be referred to a provider to establish care? No .  Dental Screening: Recommended annual dental exams for proper oral hygiene  Community Resource Referral / Chronic Care Management: CRR required this visit?  No   CCM required this visit?  No      Plan:     I have personally reviewed and noted the following in the patient's chart:   Medical and social history Use of alcohol, tobacco or illicit drugs  Current medications and supplements including opioid prescriptions. Patient is not currently taking opioid prescriptions. Functional ability and status Nutritional status Physical activity Advanced directives List of other physicians Hospitalizations, surgeries, and ER visits in previous 12 months Vitals Screenings to include cognitive, depression, and falls Referrals and appointments  In addition, I have reviewed and discussed with patient certain preventive protocols, quality metrics, and best practice recommendations. A written personalized care plan for preventive  services as well as general preventive health recommendations were provided to patient.     Annabell Sabal, CMA   11/16/2022   Nurse Notes: none

## 2022-11-20 ENCOUNTER — Other Ambulatory Visit: Payer: Self-pay | Admitting: Family Medicine

## 2022-11-20 DIAGNOSIS — M069 Rheumatoid arthritis, unspecified: Secondary | ICD-10-CM

## 2022-11-22 ENCOUNTER — Encounter (HOSPITAL_COMMUNITY)
Admission: RE | Admit: 2022-11-22 | Discharge: 2022-11-22 | Disposition: A | Discharge status: Home / Self Care | Payer: Medicare Other | Source: Ambulatory Visit | Attending: Nephrology | Admitting: Nephrology

## 2022-11-22 VITALS — BP 123/66 | HR 65 | Temp 97.7°F | Resp 16

## 2022-11-22 DIAGNOSIS — D631 Anemia in chronic kidney disease: Secondary | ICD-10-CM | POA: Diagnosis not present

## 2022-11-22 DIAGNOSIS — N184 Chronic kidney disease, stage 4 (severe): Secondary | ICD-10-CM | POA: Insufficient documentation

## 2022-11-22 LAB — POCT HEMOGLOBIN-HEMACUE: Hemoglobin: 11.7 g/dL — ABNORMAL LOW (ref 12.0–15.0)

## 2022-11-22 MED ORDER — EPOETIN ALFA-EPBX 10000 UNIT/ML IJ SOLN
20000.0000 [IU] | Freq: Once | INTRAMUSCULAR | Status: AC
  Administered 2022-11-22: 20000 [IU] via SUBCUTANEOUS

## 2022-11-22 MED ORDER — CLONIDINE HCL 0.1 MG PO TABS: 0.1000 mg | ORAL_TABLET | ORAL | Status: DC | PRN

## 2022-11-22 NOTE — Progress Notes (Signed)
Diagnosis: Anemia in Chronic Kidney Disease  Provider:  Annie Sable MD  Procedure: Injection  Retacrit (epoetin alfa-epbx), Dose: 20000 Units, Site: subcutaneous, Number of injections: 1  Hgb 11.7  Post Care: Patient declined observation  Discharge: Condition: Good, Destination: Home . AVS declined.  Performed by:  Wyvonne Lenz, RN

## 2022-11-30 ENCOUNTER — Ambulatory Visit (HOSPITAL_COMMUNITY)
Admission: RE | Admit: 2022-11-30 | Discharge: 2022-11-30 | Disposition: A | Discharge status: Home / Self Care | Payer: Medicare Other | Source: Ambulatory Visit | Attending: Urology | Admitting: Urology

## 2022-11-30 DIAGNOSIS — N2889 Other specified disorders of kidney and ureter: Secondary | ICD-10-CM | POA: Diagnosis not present

## 2022-11-30 DIAGNOSIS — N281 Cyst of kidney, acquired: Secondary | ICD-10-CM | POA: Diagnosis not present

## 2022-12-04 DIAGNOSIS — M79676 Pain in unspecified toe(s): Secondary | ICD-10-CM | POA: Diagnosis not present

## 2022-12-04 DIAGNOSIS — E1142 Type 2 diabetes mellitus with diabetic polyneuropathy: Secondary | ICD-10-CM | POA: Diagnosis not present

## 2022-12-04 DIAGNOSIS — B351 Tinea unguium: Secondary | ICD-10-CM | POA: Diagnosis not present

## 2022-12-04 DIAGNOSIS — L84 Corns and callosities: Secondary | ICD-10-CM | POA: Diagnosis not present

## 2022-12-06 ENCOUNTER — Encounter (HOSPITAL_COMMUNITY)
Admission: RE | Admit: 2022-12-06 | Discharge: 2022-12-06 | Disposition: A | Discharge status: Home / Self Care | Payer: Medicare Other | Source: Ambulatory Visit | Attending: Nephrology | Admitting: Nephrology

## 2022-12-06 VITALS — BP 138/76 | HR 63 | Temp 97.8°F | Resp 16

## 2022-12-06 DIAGNOSIS — D631 Anemia in chronic kidney disease: Secondary | ICD-10-CM | POA: Diagnosis not present

## 2022-12-06 DIAGNOSIS — N184 Chronic kidney disease, stage 4 (severe): Secondary | ICD-10-CM | POA: Diagnosis not present

## 2022-12-06 LAB — RENAL FUNCTION PANEL
Albumin: 3 g/dL — ABNORMAL LOW (ref 3.5–5.0)
Anion gap: 10 (ref 5–15)
BUN: 57 mg/dL — ABNORMAL HIGH (ref 8–23)
CO2: 20 mmol/L — ABNORMAL LOW (ref 22–32)
Calcium: 8.4 mg/dL — ABNORMAL LOW (ref 8.9–10.3)
Chloride: 106 mmol/L (ref 98–111)
Creatinine, Ser: 3.18 mg/dL — ABNORMAL HIGH (ref 0.44–1.00)
GFR, Estimated: 15 mL/min — ABNORMAL LOW (ref >60)
Glucose, Bld: 206 mg/dL — ABNORMAL HIGH (ref 70–99)
Phosphorus: 3.9 mg/dL (ref 2.5–4.6)
Potassium: 4 mmol/L (ref 3.5–5.1)
Sodium: 136 mmol/L (ref 135–145)

## 2022-12-06 LAB — IRON AND TIBC
Iron: 33 ug/dL (ref 28–170)
Saturation Ratios: 15 % (ref 10.4–31.8)
TIBC: 228 ug/dL — ABNORMAL LOW (ref 250–450)
UIBC: 195 ug/dL

## 2022-12-06 LAB — FERRITIN: Ferritin: 135 ng/mL (ref 11–307)

## 2022-12-06 LAB — POCT HEMOGLOBIN-HEMACUE: Hemoglobin: 12.2 g/dL (ref 12.0–15.0)

## 2022-12-06 MED ORDER — EPOETIN ALFA-EPBX 10000 UNIT/ML IJ SOLN
20000.0000 [IU] | Freq: Once | INTRAMUSCULAR | Status: DC
  Filled 2022-12-06: qty 2

## 2022-12-06 MED ORDER — CLONIDINE HCL 0.1 MG PO TABS: 0.1000 mg | ORAL_TABLET | ORAL | Status: DC | PRN

## 2022-12-06 NOTE — Progress Notes (Signed)
Hgb 12.2 - injection not needed

## 2022-12-07 LAB — PARATHYROID HORMONE, INTACT (NO CA): PTH: 28 pg/mL (ref 15–65)

## 2022-12-19 ENCOUNTER — Ambulatory Visit: Payer: Medicare Other | Admitting: Urology

## 2022-12-19 ENCOUNTER — Encounter (HOSPITAL_COMMUNITY)
Admission: RE | Admit: 2022-12-19 | Discharge: 2022-12-19 | Disposition: A | Discharge status: Home / Self Care | Payer: Medicare Other | Source: Ambulatory Visit | Attending: Nephrology | Admitting: Nephrology

## 2022-12-19 VITALS — BP 160/86 | HR 73 | Temp 98.3°F | Resp 18

## 2022-12-19 DIAGNOSIS — N184 Chronic kidney disease, stage 4 (severe): Secondary | ICD-10-CM | POA: Diagnosis not present

## 2022-12-19 DIAGNOSIS — D631 Anemia in chronic kidney disease: Secondary | ICD-10-CM | POA: Diagnosis not present

## 2022-12-19 LAB — POCT HEMOGLOBIN-HEMACUE: Hemoglobin: 12.4 g/dL (ref 12.0–15.0)

## 2022-12-19 MED ORDER — CLONIDINE HCL 0.1 MG PO TABS: 0.1000 mg | ORAL_TABLET | ORAL | Status: DC | PRN

## 2022-12-19 MED ORDER — EPOETIN ALFA-EPBX 10000 UNIT/ML IJ SOLN: 20000.0000 [IU] | Freq: Once | INTRAMUSCULAR | Status: DC

## 2022-12-19 NOTE — Progress Notes (Signed)
Diagnosis: Anemia in Chronic Kidney Disease  Provider:  Annie Sable MD  Procedure: Injection  Retacrit (epoetin alfa-epbx), Dose: 20000 Units,  Hgb 12.4 no injection needed  Post Care: Patient declined observation  Discharge: Condition: Good, Destination: Home . AVS declined.  Performed by:  Arrie Senate, RN

## 2022-12-26 ENCOUNTER — Other Ambulatory Visit: Payer: Self-pay | Admitting: Family Medicine

## 2022-12-26 DIAGNOSIS — E785 Hyperlipidemia, unspecified: Secondary | ICD-10-CM

## 2023-01-03 ENCOUNTER — Encounter (HOSPITAL_COMMUNITY)
Admission: RE | Admit: 2023-01-03 | Discharge: 2023-01-03 | Disposition: A | Discharge status: Home / Self Care | Payer: Medicare Other | Source: Ambulatory Visit | Attending: Nephrology | Admitting: Nephrology

## 2023-01-03 VITALS — BP 139/68 | HR 71 | Temp 98.5°F | Resp 18

## 2023-01-03 DIAGNOSIS — D631 Anemia in chronic kidney disease: Secondary | ICD-10-CM

## 2023-01-03 DIAGNOSIS — N184 Chronic kidney disease, stage 4 (severe): Secondary | ICD-10-CM | POA: Diagnosis not present

## 2023-01-03 LAB — IRON AND TIBC
Iron: 90 ug/dL (ref 28–170)
Saturation Ratios: 40 % — ABNORMAL HIGH (ref 10.4–31.8)
TIBC: 225 ug/dL — ABNORMAL LOW (ref 250–450)
UIBC: 135 ug/dL

## 2023-01-03 LAB — POCT HEMOGLOBIN-HEMACUE: Hemoglobin: 12.2 g/dL (ref 12.0–15.0)

## 2023-01-03 LAB — RENAL FUNCTION PANEL
Albumin: 3 g/dL — ABNORMAL LOW (ref 3.5–5.0)
Anion gap: 7 (ref 5–15)
BUN: 49 mg/dL — ABNORMAL HIGH (ref 8–23)
CO2: 23 mmol/L (ref 22–32)
Calcium: 8.4 mg/dL — ABNORMAL LOW (ref 8.9–10.3)
Chloride: 106 mmol/L (ref 98–111)
Creatinine, Ser: 2.97 mg/dL — ABNORMAL HIGH (ref 0.44–1.00)
GFR, Estimated: 16 mL/min — ABNORMAL LOW (ref >60)
Glucose, Bld: 106 mg/dL — ABNORMAL HIGH (ref 70–99)
Phosphorus: 4 mg/dL (ref 2.5–4.6)
Potassium: 3.8 mmol/L (ref 3.5–5.1)
Sodium: 136 mmol/L (ref 135–145)

## 2023-01-03 LAB — FERRITIN: Ferritin: 163 ng/mL (ref 11–307)

## 2023-01-03 MED ORDER — EPOETIN ALFA-EPBX 10000 UNIT/ML IJ SOLN: 20000.0000 [IU] | Freq: Once | INTRAMUSCULAR | Status: DC

## 2023-01-03 MED ORDER — CLONIDINE HCL 0.1 MG PO TABS: 0.1000 mg | ORAL_TABLET | ORAL | Status: DC | PRN

## 2023-01-03 NOTE — Addendum Note (Signed)
Encounter addended by: Tad Moore, [facility] on: 01/03/2023 1:48 PM  Actions taken: Order list changed

## 2023-01-03 NOTE — Progress Notes (Signed)
Hgb. 12.2 injection not given.

## 2023-01-05 LAB — PARATHYROID HORMONE, INTACT (NO CA): PTH: 55 pg/mL (ref 15–65)

## 2023-01-07 ENCOUNTER — Ambulatory Visit: Payer: Medicare Other | Admitting: Urology

## 2023-01-07 VITALS — BP 178/81 | HR 66 | Ht 67.0 in | Wt 115.0 lb

## 2023-01-07 DIAGNOSIS — N2889 Other specified disorders of kidney and ureter: Secondary | ICD-10-CM

## 2023-01-07 LAB — URINALYSIS, ROUTINE W REFLEX MICROSCOPIC
Bilirubin, UA: NEGATIVE
Glucose, UA: NEGATIVE
Ketones, UA: NEGATIVE
Nitrite, UA: NEGATIVE
Specific Gravity, UA: 1.02 (ref 1.005–1.030)
Urobilinogen, Ur: 0.2 mg/dL (ref 0.2–1.0)
pH, UA: 5.5 (ref 5.0–7.5)

## 2023-01-07 LAB — MICROSCOPIC EXAMINATION: Epithelial Cells (non renal): >10 /hpf — AB (ref 0–10)

## 2023-01-07 NOTE — Progress Notes (Signed)
01/07/2023 3:16 PM   Caswell Corwin Bar 1946-01-03 [id number]  Referring provider: Raliegh Ip, DO [address] [place],  [place] [address]  Followup renal mass   HPI: Ms Eskelson is a 76yo here for followup for a left renal mass. Renal US shows no solid renal masses. She denies any flank pain. No straining to urinate. No other complaints today.    PMH: Past Medical History:  Diagnosis Date   Anemia    Arthritis    Cataract    Coronary artery disease    Mild plaque 2012   Diabetes mellitus    Essential hypertension 02/10/2012   GERD (gastroesophageal reflux disease)    Hyperlipidemia    Hypertension    Neuropathy    Peptic ulcer    Rheumatoid arthritis (HCC)    TIA (transient ischemic attack) 02/10/2012    Surgical History: Past Surgical History:  Procedure Laterality Date   ABSCESS DRAINAGE     CARPAL TUNNEL RELEASE Left    CHOLECYSTECTOMY     KNEE SURGERY Right    NECK SURGERY     x 2   SHOULDER SURGERY Left    TEE WITHOUT CARDIOVERSION N/A 03/12/2022   Procedure: TRANSESOPHAGEAL ECHOCARDIOGRAM (TEE);  Surgeon: Thurmon Fair, MD;  Location: [facility] ENDOSCOPY;  Service: Cardiovascular;  Laterality: N/A;    Home Medications:  Allergies as of 01/07/2023       Reactions   Iodinated Contrast Media    Unknown reaction    Latex    Unknown reaction    Lisinopril Swelling   lips   Shellfish Allergy    Unknown Reaction         Medication List        Accurate as of January 07, 2023  3:16 PM. If you have any questions, ask your nurse or doctor.          Accu-Chek Softclix Lancets lancets CHECK BLOOD SUGAR TWICE A DAY AND AS NEEDED Dx E11.21   amLODipine 2.5 MG tablet Commonly known as: NORVASC Take 1 tablet (2.5 mg total) by mouth daily.   aspirin EC 81 MG tablet Take 81 mg by mouth daily.   atorvastatin 10 MG tablet Commonly known as: LIPITOR TAKE 1 TABLET DAILY   Cholecalciferol 125 MCG (5000 UT) Tabs Take 5,000 Units by mouth daily.    clobetasol 0.05 % external solution Commonly known as: TEMOVATE Apply 1 Application topically daily as needed (scalp irriration).   Fish Oil 1000 MG Caps Take 1,000 mg by mouth daily.   fluticasone 50 MCG/ACT nasal spray Commonly known as: FLONASE USE 2 SPRAYS IN EACH NOSTRIL ONCE DAILY   folic acid 1 MG tablet Commonly known as: FOLVITE TAKE ONE TABLET DAILY   [name] 2 Sensor Misc USE TO TEST BLOOD SUGAR CONTINUOUSLY. Dx: E11.65. ADVANCED DIABETES SUPPLY VIA PARACHUTE   Lantus SoloStar 100 UNIT/ML Solostar Pen Generic drug: insulin glargine Inject 5-10 Units into the skin at bedtime.   pantoprazole 40 MG tablet Commonly known as: PROTONIX TAKE ONE TABLET DAILY        Allergies:  Allergies  Allergen Reactions   Iodinated Contrast Media     Unknown reaction    Latex     Unknown reaction    Lisinopril Swelling    lips   Shellfish Allergy     Unknown Reaction     Family History: Family History  Problem Relation Age of Onset   Heart disease Sister  Congeital (died age 71) with "enlarged heart"   CAD Sister    Diabetes Mother    Heart disease Mother        Later onset heart disease    Dementia Mother    Alcohol abuse Father    Liver disease Father    CAD Sister 57       CABG   Early death Sister    Diabetes Brother    Kidney disease Brother        related to DM   Diabetes Brother     Social History:  reports that she quit smoking about 9 years ago. Her smoking use included cigarettes and e-cigarettes. She started smoking about 34 years ago. She has never used smokeless tobacco. She reports that she does not drink alcohol and does not use drugs.  ROS: All other review of systems were reviewed and are negative except what is noted above in HPI  Physical Exam: BP (!) 178/81   Pulse 66   Ht 5\' 7"  (1.702 m)   Wt 115 lb (52.2 kg)   BMI 18.01 kg/m   Constitutional:  Alert and oriented, No acute distress. HEENT: [place] AT, moist mucus  membranes.  Trachea midline, no masses. Cardiovascular: No clubbing, cyanosis, or edema. Respiratory: Normal respiratory effort, no increased work of breathing. GI: Abdomen is soft, nontender, nondistended, no abdominal masses GU: No CVA tenderness.  Lymph: No cervical or inguinal lymphadenopathy. Skin: No rashes, bruises or suspicious lesions. Neurologic: Grossly intact, no focal deficits, moving all 4 extremities. Psychiatric: Normal mood and affect.  Laboratory Data: Lab Results  Component Value Date   WBC 5.7 07/19/2022   HGB 12.2 01/03/2023   HCT 22.8 (L) 07/19/2022   MCV 93.4 07/19/2022   PLT 252 07/19/2022    Lab Results  Component Value Date   CREATININE 2.97 (H) 01/03/2023    No results found for: "PSA"  No results found for: "TESTOSTERONE"  Lab Results  Component Value Date   HGBA1C 6.5 (H) 10/16/2022    Urinalysis    Component Value Date/Time   COLORURINE YELLOW 05/05/2021 1336   APPEARANCEUR Clear 11/02/2022 0937   LABSPEC 1.010 05/05/2021 1336   PHURINE 5.0 05/05/2021 1336   GLUCOSEU Negative 11/02/2022 0937   HGBUR SMALL (A) 05/05/2021 1336   BILIRUBINUR Negative 11/02/2022 0937   KETONESUR NEGATIVE 05/05/2021 1336   PROTEINUR 2+ (A) 11/02/2022 0937   PROTEINUR 100 (A) 05/05/2021 1336   UROBILINOGEN 1.0 02/10/2012 0817   NITRITE Negative 11/02/2022 0937   NITRITE NEGATIVE 05/05/2021 1336   LEUKOCYTESUR Trace (A) 11/02/2022 0937   LEUKOCYTESUR NEGATIVE 05/05/2021 1336    Lab Results  Component Value Date   LABMICR See below: 11/02/2022   WBCUA 6-10 (A) 11/02/2022   RBCUA 0-2 05/02/2017   LABEPIT >10 (A) 11/02/2022   MUCUS Present 05/02/2017   BACTERIA Few (A) 11/02/2022    Pertinent Imaging: Renal US 11/30/2022: Images reviewed and discussed with the patient  No results found for this or any previous visit.  No results found for this or any previous visit.  No results found for this or any previous visit.  No results found for this  or any previous visit.  Results for orders placed in visit on 11/02/22  Ultrasound renal complete  Narrative CLINICAL DATA:  left renal mass  EXAM: RENAL / URINARY TRACT ULTRASOUND COMPLETE  COMPARISON:  None Available.  FINDINGS: The right kidney measured 8.8 cm and the left kidney measured 9.7 cm. The kidneys  demonstrate increased echogenicity consistent with chronic medical renal disease. Bilateral renal cysts noted. Largest cyst on the right is in the lower pole measuring 2.8 cm. Largest cyst on the left lower pole measuring 4.6 cm. The cysts do not need further imaging follow up. No shadowing stones are seen. No hydronephrosis identified.  IMPRESSION: 1. Echogenic kidneys consistent with chronic medical renal disease. 2. Bilateral renal cysts. 3. No solid renal parenchymal lesions identified.   Electronically Signed By: Layla Maw M.D. On: 12/24/2022 11:15  No valid procedures specified. No results found for this or any previous visit.  No results found for this or any previous visit.   Assessment & Plan:    1. Nodule of kidney Followup 6 months with a renal US - Urinalysis, Routine w reflex microscopic   No follow-ups on file.  Wilkie Aye, MD  [facility] [place]

## 2023-01-13 ENCOUNTER — Telehealth: Payer: Self-pay | Admitting: Pharmacy Technician

## 2023-01-13 NOTE — Telephone Encounter (Signed)
Auth Submission: NO AUTH NEEDED Site of care: Site of care: [facility] Payer: [employer] MEDICARE Medication & CPT/J Code(s) submitted: RETACRIT 20,000 UNITS Route of submission (phone, fax, portal): PORTAL Phone # Fax # Auth type: Buy/Bill PB Units/visits requested: 20,000 UNITS Q28D Reference number: [id number] Approval from: 12/18/22 to 06/18/23

## 2023-01-14 ENCOUNTER — Telehealth: Payer: Self-pay | Admitting: Family Medicine

## 2023-01-14 DIAGNOSIS — E1169 Type 2 diabetes mellitus with other specified complication: Secondary | ICD-10-CM

## 2023-01-14 MED ORDER — FREESTYLE LIBRE 2 SENSOR MISC
3 refills | Status: DC
Start: 2023-01-14 — End: 2024-04-27

## 2023-01-14 NOTE — Telephone Encounter (Signed)
  Prescription Request  01/14/2023  Is this a "Controlled Substance" medicine? Continuous Blood Gluc Sensor ([name] 2 SENSOR) MISC   Have you seen your PCP in the last 2 weeks? 01/16/2023  If YES, route message to pool  -  If NO, patient needs to be scheduled for appointment.  What is the name of the medication or equipment? Continuous Blood Gluc Sensor ([name] 2 SENSOR) MISC   Have you contacted your pharmacy to request a refill? no   Which pharmacy would you like this sent to? [facility] phone [phone] fax [phone]   Patient notified that their request is being sent to the clinical staff for review and that they should receive a response within 2 business days.

## 2023-01-14 NOTE — Telephone Encounter (Signed)
Pt aware refill sent electronically

## 2023-01-15 ENCOUNTER — Encounter: Payer: Self-pay | Admitting: Urology

## 2023-01-15 NOTE — Patient Instructions (Signed)

## 2023-01-16 ENCOUNTER — Ambulatory Visit (INDEPENDENT_AMBULATORY_CARE_PROVIDER_SITE_OTHER): Payer: Medicare Other | Admitting: Family Medicine

## 2023-01-16 ENCOUNTER — Encounter: Payer: Self-pay | Admitting: Family Medicine

## 2023-01-16 VITALS — BP 140/79 | HR 77 | Temp 97.9°F | Ht 67.0 in | Wt 116.0 lb

## 2023-01-16 DIAGNOSIS — I129 Hypertensive chronic kidney disease with stage 1 through stage 4 chronic kidney disease, or unspecified chronic kidney disease: Secondary | ICD-10-CM | POA: Diagnosis not present

## 2023-01-16 DIAGNOSIS — N184 Chronic kidney disease, stage 4 (severe): Secondary | ICD-10-CM | POA: Diagnosis not present

## 2023-01-16 DIAGNOSIS — M069 Rheumatoid arthritis, unspecified: Secondary | ICD-10-CM | POA: Diagnosis not present

## 2023-01-16 DIAGNOSIS — E1159 Type 2 diabetes mellitus with other circulatory complications: Secondary | ICD-10-CM

## 2023-01-16 DIAGNOSIS — E1169 Type 2 diabetes mellitus with other specified complication: Secondary | ICD-10-CM | POA: Diagnosis not present

## 2023-01-16 DIAGNOSIS — E1122 Type 2 diabetes mellitus with diabetic chronic kidney disease: Secondary | ICD-10-CM | POA: Diagnosis not present

## 2023-01-16 DIAGNOSIS — Z794 Long term (current) use of insulin: Secondary | ICD-10-CM

## 2023-01-16 DIAGNOSIS — E785 Hyperlipidemia, unspecified: Secondary | ICD-10-CM

## 2023-01-16 LAB — BAYER DCA HB A1C WAIVED: HB A1C (BAYER DCA - WAIVED): 7.3 % — ABNORMAL HIGH (ref 4.8–5.6)

## 2023-01-16 MED ORDER — ATORVASTATIN CALCIUM 10 MG PO TABS
10.0000 mg | ORAL_TABLET | Freq: Every day | ORAL | 3 refills | Status: DC
Start: 2023-01-16 — End: 2024-01-28

## 2023-01-16 MED ORDER — AMLODIPINE BESYLATE 2.5 MG PO TABS
2.5000 mg | ORAL_TABLET | Freq: Every day | ORAL | 3 refills | Status: DC
Start: 2023-01-16 — End: 2023-11-06

## 2023-01-16 MED ORDER — FOLIC ACID 1 MG PO TABS
1.0000 mg | ORAL_TABLET | Freq: Every day | ORAL | 3 refills | Status: DC
Start: 2023-01-16 — End: 2024-02-26

## 2023-01-16 NOTE — Progress Notes (Signed)
Subjective: CC:DM PCP: Raliegh Ip, DO HPI:Erica Crosby is a 77 y.o. female presenting to clinic today for:  1. Type 2 Diabetes with hypertension, hyperlipidemia w/ CKD4:  Patient is brought to the office by a family member.  She notes that they have seen the urologist and the renal lesion appreciated on previous imaging was in fact cystic and is unlikely to be anything of significance.  She will be seeing him again in 6 months.  She is compliant with all medications including 3 to 4 units of Lantus nightly.  She is awaiting her new freestyle libre sensor from her distributor.  She reports no hypoglycemic episodes.  Continues to see Dr. Ulice Brilliant for nail care  Diabetes Health Maintenance Due  Topic Date Due   HEMOGLOBIN A1C  04/17/2023   OPHTHALMOLOGY EXAM  04/27/2023   FOOT EXAM  01/16/2024    Last A1c:  Lab Results  Component Value Date   HGBA1C 6.5 (H) 10/16/2022    ROS: Per HPI  Allergies  Allergen Reactions   Iodinated Contrast Media     Unknown reaction    Latex     Unknown reaction    Lisinopril Swelling    lips   Shellfish Allergy     Unknown Reaction    Past Medical History:  Diagnosis Date   Anemia    Arthritis    Cataract    Coronary artery disease    Mild plaque 2012   Diabetes mellitus    Essential hypertension 02/10/2012   GERD (gastroesophageal reflux disease)    Hyperlipidemia    Hypertension    Neuropathy    Peptic ulcer    Rheumatoid arthritis (HCC)    TIA (transient ischemic attack) 02/10/2012    Current Outpatient Medications:    Accu-Chek Softclix Lancets lancets, CHECK BLOOD SUGAR TWICE A DAY AND AS NEEDED Dx E11.21, Disp: 200 each, Rfl: 3   aspirin EC 81 MG tablet, Take 81 mg by mouth daily., Disp: , Rfl:    Cholecalciferol 125 MCG (5000 UT) TABS, Take 5,000 Units by mouth daily., Disp: , Rfl:    clobetasol (TEMOVATE) 0.05 % external solution, Apply 1 Application topically daily as needed (scalp irriration)., Disp: 50 mL, Rfl: 1    Continuous Glucose Sensor ([name] 2 SENSOR) MISC, USE TO TEST BLOOD SUGAR CONTINUOUSLY. Dx: E11.65. ADVANCED DIABETES SUPPLY VIA [place], Disp: 6 each, Rfl: 3   fluticasone (FLONASE) 50 MCG/ACT nasal spray, USE 2 SPRAYS IN EACH NOSTRIL ONCE DAILY, Disp: 16 g, Rfl: 6   insulin glargine (LANTUS SOLOSTAR) 100 UNIT/ML Solostar Pen, Inject 5-10 Units into the skin at bedtime., Disp: 15 mL, Rfl: 2   Omega-3 Fatty Acids (FISH OIL) 1000 MG CAPS, Take 1,000 mg by mouth daily., Disp: , Rfl:    pantoprazole (PROTONIX) 40 MG tablet, TAKE ONE TABLET DAILY, Disp: 90 tablet, Rfl: 1   amLODipine (NORVASC) 2.5 MG tablet, Take 1 tablet (2.5 mg total) by mouth daily., Disp: 90 tablet, Rfl: 3   atorvastatin (LIPITOR) 10 MG tablet, Take 1 tablet (10 mg total) by mouth daily., Disp: 90 tablet, Rfl: 3   folic acid (FOLVITE) 1 MG tablet, Take 1 tablet (1 mg total) by mouth daily., Disp: 90 tablet, Rfl: 3 Social History   Socioeconomic History   Marital status: Widowed    Spouse name: Not on file   Number of children: 3   Years of education: 12   Highest education level: High school graduate  Occupational History  Occupation: retired  Tobacco Use   Smoking status: Former    Current packs/day: 0.00    Types: Cigarettes, E-cigarettes    Start date: 02/03/1988    Quit date: 02/02/2013    Years since quitting: 9.9   Smokeless tobacco: Never  Vaping Use   Vaping status: Never Used  Substance and Sexual Activity   Alcohol use: No   Drug use: No   Sexual activity: Not Currently    Birth control/protection: Post-menopausal  Other Topics Concern   Not on file  Social History Narrative   Daughter stays with her.    Son comes daily when daughter is working   Chemical engineer Strain: Low Risk  (11/16/2022)   Overall Financial Resource Strain (CARDIA)    Difficulty of Paying Living Expenses: Not hard at all  Food Insecurity: No Food Insecurity (11/16/2022)   Hunger  Vital Sign    Worried About Running Out of Food in the Last Year: Never true    Ran Out of Food in the Last Year: Never true  Transportation Needs: Unmet Transportation Needs (11/16/2022)   PRAPARE - Administrator, Civil Service (Medical): Yes    Lack of Transportation (Non-Medical): No  Physical Activity: Insufficiently Active (11/16/2022)   Exercise Vital Sign    Days of Exercise per Week: 7 days    Minutes of Exercise per Session: 20 min  Stress: No Stress Concern Present (11/16/2022)   [employer] of Occupational Health - Occupational Stress Questionnaire    Feeling of Stress : Not at all  Social Connections: Moderately Integrated (11/16/2022)   Social Connection and Isolation Panel [NHANES]    Frequency of Communication with Friends and Family: Once a week    Frequency of Social Gatherings with Friends and Family: Once a week    Attends Religious Services: 1 to 4 times per year    Active Member of [employer] or Organizations: Yes    Attends Engineer, structural: More than 4 times per year    Marital Status: Married  Catering manager Violence: Not At Risk (11/16/2022)   Humiliation, Afraid, Rape, and Kick questionnaire    Fear of Current or Ex-Partner: No    Emotionally Abused: No    Physically Abused: No    Sexually Abused: No   Family History  Problem Relation Age of Onset   Heart disease Sister        Congeital (died age 31) with "enlarged heart"   CAD Sister    Diabetes Mother    Heart disease Mother        Later onset heart disease    Dementia Mother    Alcohol abuse Father    Liver disease Father    CAD Sister 27       CABG   Early death Sister    Diabetes Brother    Kidney disease Brother        related to DM   Diabetes Brother     Objective: Office vital signs reviewed. BP (!) 140/79   Pulse 77   Temp 97.9 F (36.6 C)   Ht 5\' 7"  (1.702 m)   Wt 116 lb (52.6 kg)   SpO2 97%   BMI 18.17 kg/m   Physical Examination:  General: Awake,  alert, thin, nontoxic elderly female, No acute distress HEENT: sclera white, MMM Cardio: regular rate and rhythm, S1S2 heard, soft murmur appreciated Pulm: clear to auscultation bilaterally, no wheezes, rhonchi or rales;  normal work of breathing on room air  Diabetic Foot Exam - Simple   Simple Foot Form Diabetic Foot exam was performed with the following findings: Yes 01/16/2023  1:08 PM  Visual Inspection See comments: Yes Sensation Testing Intact to touch and monofilament testing bilaterally: Yes Pulse Check Posterior Tibialis and Dorsalis pulse intact bilaterally: Yes Comments Onychomycotic changes to bilateral feet and nails      Assessment/ Plan: 77 y.o. 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  Given her risk of hypoglycemia and falls I do not think that tighter control is appropriate in this patient.  Urine microalbumin collected today.  Continue to follow-up with nephrology for CKD 4.  Amlodipine renewed for ongoing blood pressure control.  Continue statin.  This has been renewed  Folic acid also renewed for patient  Orders Placed This Encounter  Procedures   Microalbumin / creatinine urine ratio   Bayer DCA Hb A1c Waived   Meds ordered this encounter  Medications   amLODipine (NORVASC) 2.5 MG tablet    Sig: Take 1 tablet (2.5 mg total) by mouth daily.    Dispense:  90 tablet     Refill:  3   atorvastatin (LIPITOR) 10 MG tablet    Sig: Take 1 tablet (10 mg total) by mouth daily.    Dispense:  90 tablet    Refill:  3   folic acid (FOLVITE) 1 MG tablet    Sig: Take 1 tablet (1 mg total) by mouth daily.    Dispense:  90 tablet    Refill:  3     Bette Brienza Hulen Skains, DO [facility] [phone]

## 2023-01-17 ENCOUNTER — Ambulatory Visit: Payer: Medicare Other

## 2023-01-17 ENCOUNTER — Encounter (HOSPITAL_COMMUNITY): Admission: RE | Admit: 2023-01-17 | Payer: Medicare Other | Source: Ambulatory Visit

## 2023-01-17 DIAGNOSIS — E1165 Type 2 diabetes mellitus with hyperglycemia: Secondary | ICD-10-CM | POA: Diagnosis not present

## 2023-01-18 ENCOUNTER — Ambulatory Visit: Payer: Medicare Other

## 2023-01-18 ENCOUNTER — Encounter (HOSPITAL_COMMUNITY): Payer: Medicare Other | Attending: Nephrology | Admitting: Emergency Medicine

## 2023-01-18 VITALS — BP 175/77 | HR 68 | Temp 98.1°F

## 2023-01-18 DIAGNOSIS — D631 Anemia in chronic kidney disease: Secondary | ICD-10-CM | POA: Diagnosis not present

## 2023-01-18 DIAGNOSIS — N184 Chronic kidney disease, stage 4 (severe): Secondary | ICD-10-CM

## 2023-01-18 MED ORDER — EPOETIN ALFA-EPBX 40000 UNIT/ML IJ SOLN
20000.0000 [IU] | Freq: Once | INTRAMUSCULAR | Status: AC
  Administered 2023-01-18: 20000 [IU] via SUBCUTANEOUS

## 2023-01-18 MED ORDER — CLONIDINE HCL 0.1 MG PO TABS: 0.1000 mg | ORAL_TABLET | ORAL | Status: DC | PRN

## 2023-01-18 NOTE — Progress Notes (Signed)
Diagnosis: Anemia in Chronic Kidney Disease  Provider:  Annie Sable MD  Procedure: Injection  Retacrit (epoetin alfa-epbx), Dose: 20000 Units, Site: subcutaneous, Number of injections: 1  Hgb. 10.2  Post Care: Observation period completed  Discharge: Condition: Good, Destination: Home . AVS Provided  Performed by:  Daleen Squibb, RN

## 2023-01-21 NOTE — Progress Notes (Signed)
Diagnosis: Anemia in Chronic Kidney Disease  Provider:  Annie Sable MD  Procedure: Injection  Retacrit (epoetin alfa-epbx), Dose: 20000 Units, Site: subcutaneous, Number of injections: 1  Hgb. 10.2  Post Care: Observation period completed  Discharge: Condition: Good, Destination: Home . AVS Provided  Performed by:  Marin Shutter, RN

## 2023-01-28 DIAGNOSIS — N183 Chronic kidney disease, stage 3 unspecified: Secondary | ICD-10-CM | POA: Diagnosis not present

## 2023-01-28 DIAGNOSIS — E1122 Type 2 diabetes mellitus with diabetic chronic kidney disease: Secondary | ICD-10-CM | POA: Diagnosis not present

## 2023-01-28 DIAGNOSIS — R634 Abnormal weight loss: Secondary | ICD-10-CM | POA: Diagnosis not present

## 2023-01-28 DIAGNOSIS — I129 Hypertensive chronic kidney disease with stage 1 through stage 4 chronic kidney disease, or unspecified chronic kidney disease: Secondary | ICD-10-CM | POA: Diagnosis not present

## 2023-01-28 DIAGNOSIS — M069 Rheumatoid arthritis, unspecified: Secondary | ICD-10-CM | POA: Diagnosis not present

## 2023-01-28 DIAGNOSIS — N179 Acute kidney failure, unspecified: Secondary | ICD-10-CM | POA: Diagnosis not present

## 2023-01-28 DIAGNOSIS — N2889 Other specified disorders of kidney and ureter: Secondary | ICD-10-CM | POA: Diagnosis not present

## 2023-01-28 DIAGNOSIS — D631 Anemia in chronic kidney disease: Secondary | ICD-10-CM | POA: Diagnosis not present

## 2023-01-28 DIAGNOSIS — Z7189 Other specified counseling: Secondary | ICD-10-CM | POA: Diagnosis not present

## 2023-01-31 ENCOUNTER — Encounter (HOSPITAL_COMMUNITY): Payer: Medicare Other

## 2023-01-31 ENCOUNTER — Ambulatory Visit: Payer: Medicare Other

## 2023-02-01 ENCOUNTER — Encounter (INDEPENDENT_AMBULATORY_CARE_PROVIDER_SITE_OTHER): Payer: Medicare Other

## 2023-02-01 ENCOUNTER — Other Ambulatory Visit: Payer: Self-pay | Admitting: Nephrology

## 2023-02-01 VITALS — BP 168/67 | HR 81 | Temp 99.0°F | Resp 16

## 2023-02-01 DIAGNOSIS — N184 Chronic kidney disease, stage 4 (severe): Secondary | ICD-10-CM

## 2023-02-01 NOTE — Progress Notes (Signed)
Diagnosis: Anemia in Chronic Kidney Disease  Provider:  Katrinka Blazing MD  No injection needed      Hgb 11.0   Discharge: Condition: Good, Destination: Home . AVS Provided  Performed by:  Marlow Baars [place], RN

## 2023-02-02 LAB — RENAL FUNCTION PANEL
Albumin: 3.6 g/dL (ref 3.6–5.1)
BUN/Creatinine Ratio: 14 (calc) (ref 6–22)
BUN: 44 mg/dL — ABNORMAL HIGH (ref 7–25)
CO2: 19 mmol/L — ABNORMAL LOW (ref 20–32)
Calcium: 9.2 mg/dL (ref 8.6–10.4)
Chloride: 103 mmol/L (ref 98–110)
Creat: 3.15 mg/dL — ABNORMAL HIGH (ref 0.60–1.00)
Glucose, Bld: 133 mg/dL — ABNORMAL HIGH (ref 65–99)
Phosphorus: 4.4 mg/dL — ABNORMAL HIGH (ref 2.1–4.3)
Potassium: 4.5 mmol/L (ref 3.5–5.3)
Sodium: 135 mmol/L (ref 135–146)

## 2023-02-02 LAB — IRON,TIBC AND FERRITIN PANEL
%SAT: 11 % — ABNORMAL LOW (ref 16–45)
Ferritin: 222 ng/mL (ref 16–288)
Iron: 23 ug/dL — ABNORMAL LOW (ref 45–160)
TIBC: 206 ug/dL — ABNORMAL LOW (ref 250–450)

## 2023-02-02 LAB — PTH, INTACT AND CALCIUM
Calcium: 9.2 mg/dL (ref 8.6–10.4)
PTH: 43 pg/mL (ref 16–77)

## 2023-02-07 ENCOUNTER — Other Ambulatory Visit: Payer: Self-pay

## 2023-02-08 ENCOUNTER — Telehealth: Payer: Self-pay

## 2023-02-08 NOTE — Telephone Encounter (Signed)
Auth Submission: NO AUTH NEEDED Site of care: Site of care: [facility] Payer: [employer] MEDICARE  Medication & CPT/J Code(s) submitted: Retacrit [id number] Route of submission (phone, fax, portal): portal Phone # Fax # Auth type: Buy/Bill PB Units/visits requested: 20,000u,q4weeks Reference number: [id number] Approval from: 02/08/23 to 06/18/23

## 2023-02-14 ENCOUNTER — Encounter (HOSPITAL_COMMUNITY): Payer: Medicare Other

## 2023-02-14 LAB — LAB REPORT - SCANNED: Microalb Creat Ratio: 1291

## 2023-02-15 ENCOUNTER — Ambulatory Visit: Payer: Medicare Other

## 2023-02-20 ENCOUNTER — Encounter: Payer: Self-pay | Admitting: Nephrology

## 2023-02-28 ENCOUNTER — Encounter: Payer: Medicare Other | Attending: Nephrology | Admitting: *Deleted

## 2023-02-28 ENCOUNTER — Other Ambulatory Visit: Payer: Self-pay | Admitting: Emergency Medicine

## 2023-02-28 VITALS — BP 119/72 | HR 73 | Temp 97.7°F | Resp 18

## 2023-02-28 DIAGNOSIS — N184 Chronic kidney disease, stage 4 (severe): Secondary | ICD-10-CM

## 2023-02-28 DIAGNOSIS — D631 Anemia in chronic kidney disease: Secondary | ICD-10-CM | POA: Diagnosis not present

## 2023-02-28 LAB — GLUCOSE HEMOCUE WAIVED: Hemoglobin: 11.1

## 2023-02-28 MED ORDER — EPOETIN ALFA-EPBX 10000 UNIT/ML IJ SOLN
20000.0000 [IU] | Freq: Once | INTRAMUSCULAR | Status: AC
  Administered 2023-02-28: 20000 [IU] via SUBCUTANEOUS
  Filled 2023-02-28: qty 2

## 2023-02-28 NOTE — Progress Notes (Signed)
Diagnosis: Anemia in Chronic Kidney Disease  Provider:  Annie Sable MD  Procedure: Injection  Retacrit (epoetin alfa-epbx), Dose: 20000 Units, Site: subcutaneous, Number of injections: 1 Hgb. 11.1  Post Care: Observation period completed  Discharge: Condition: Good, Destination: Home . AVS Provided  Performed by:  Daleen Squibb, RN

## 2023-03-01 ENCOUNTER — Ambulatory Visit: Payer: Medicare Other

## 2023-03-01 LAB — RENAL FUNCTION PANEL
Albumin: 3.7 g/dL (ref 3.6–5.1)
BUN/Creatinine Ratio: 20 (calc) (ref 6–22)
BUN: 81 mg/dL — ABNORMAL HIGH (ref 7–25)
CO2: 18 mmol/L — ABNORMAL LOW (ref 20–32)
Calcium: 9.1 mg/dL (ref 8.6–10.4)
Chloride: 106 mmol/L (ref 98–110)
Creat: 4.05 mg/dL — ABNORMAL HIGH (ref 0.60–1.00)
Glucose, Bld: 157 mg/dL — ABNORMAL HIGH (ref 65–99)
Phosphorus: 4 mg/dL (ref 2.1–4.3)
Potassium: 4.4 mmol/L (ref 3.5–5.3)
Sodium: 136 mmol/L (ref 135–146)

## 2023-03-01 LAB — PTH, INTACT AND CALCIUM
Calcium: 9.1 mg/dL (ref 8.6–10.4)
PTH: 43 pg/mL (ref 16–77)

## 2023-03-01 LAB — IRON,TIBC AND FERRITIN PANEL
%SAT: 45 % (ref 16–45)
Ferritin: 286 ng/mL (ref 16–288)
Iron: 94 ug/dL (ref 45–160)
TIBC: 210 ug/dL — ABNORMAL LOW (ref 250–450)

## 2023-03-12 ENCOUNTER — Other Ambulatory Visit: Payer: Self-pay | Admitting: Family Medicine

## 2023-03-23 IMAGING — CT CT CERVICAL SPINE W/O CM
3 of 4 series · 12 of 33 positions shown, 14 images · non-contrast
Comparison: None.

CLINICAL DATA: Neck trauma (Age >= 65y)

EXAM:
CT CERVICAL SPINE WITHOUT CONTRAST
TECHNIQUE: Multidetector CT imaging of the cervical spine was performed without
intravenous contrast. Multiplanar CT image reconstructions were also
generated.

[Series 5: sagittal bone · sagittal · 0.29mm/px · 5 of 66 slices shown, 6 images]
[im 22/66  bone]
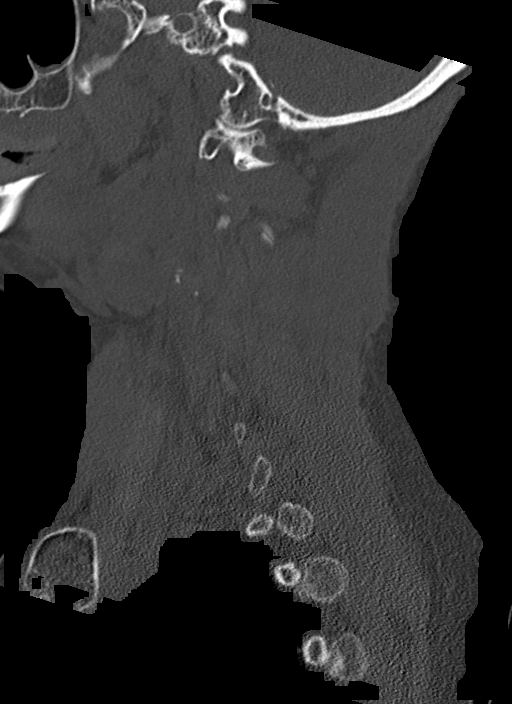
[im 28/66  bone]
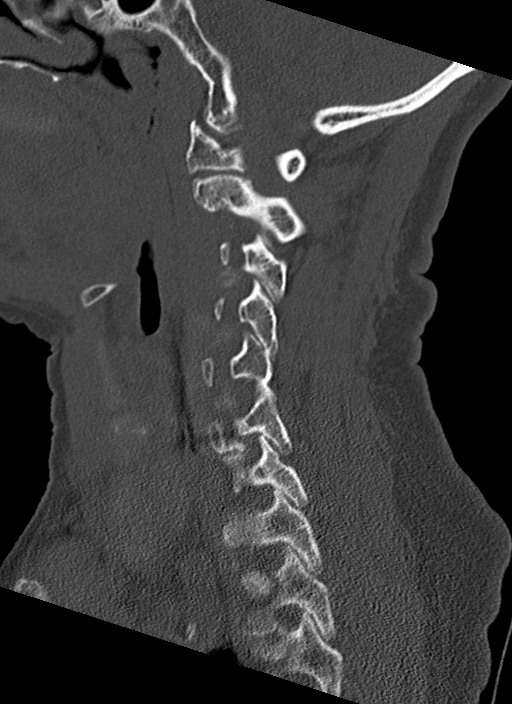
[im 33/66  soft-tissue]
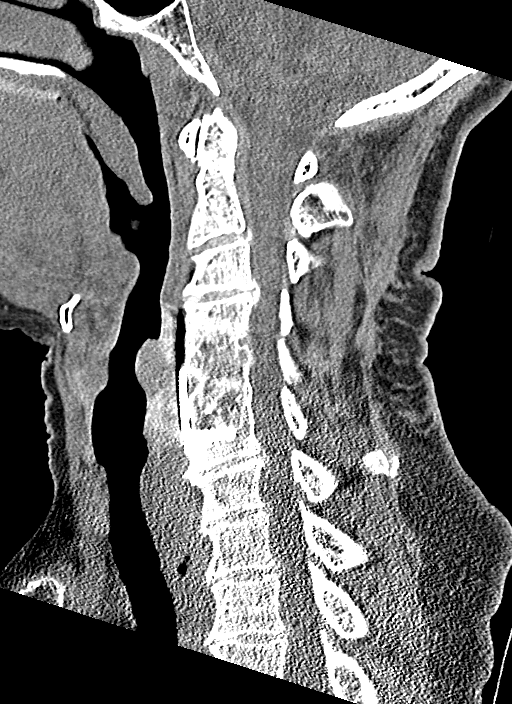
[im 33/66  bone]
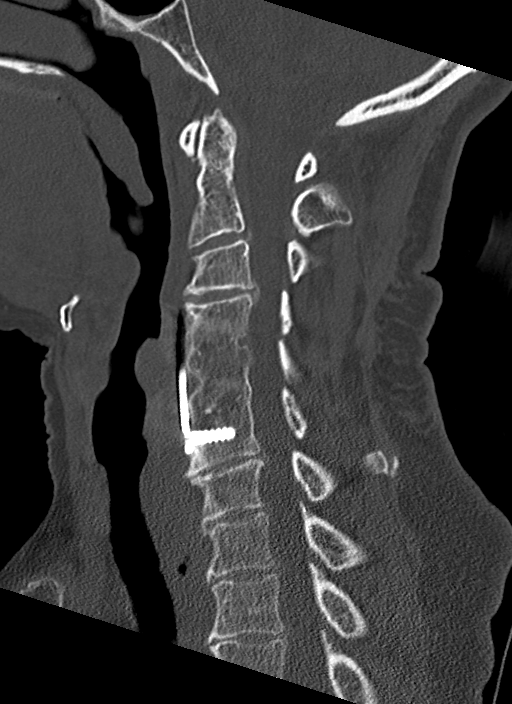
[im 38/66  bone]
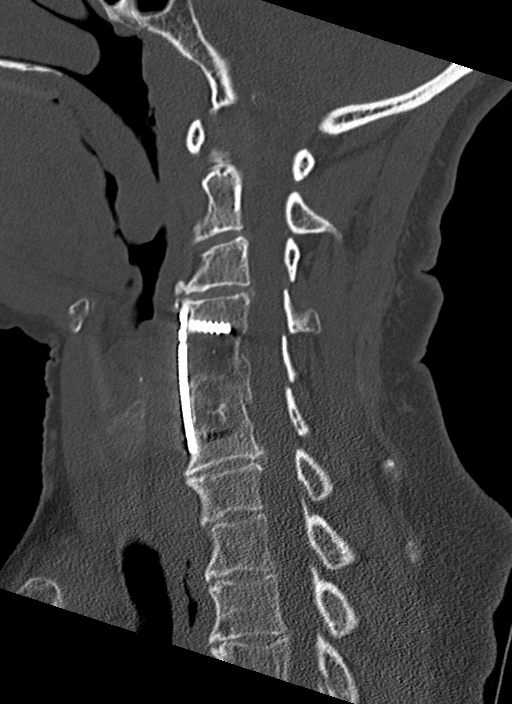
[im 44/66  bone]
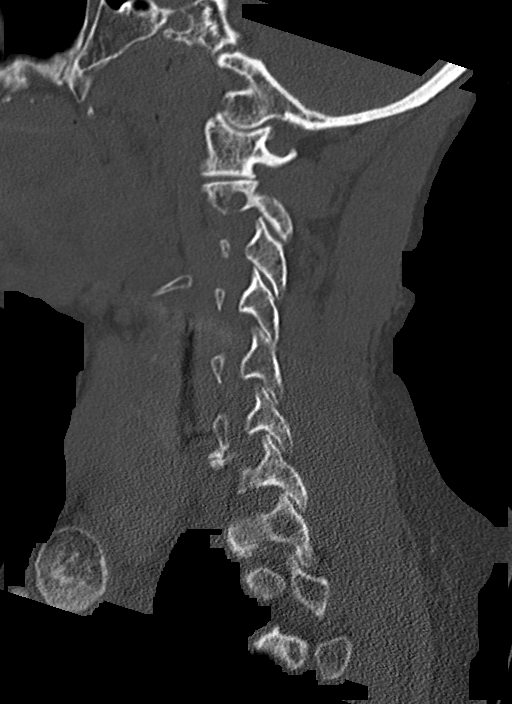

[Series 6: coronal bone · coronal · 0.26mm/px · 3 of 75 slices shown]
[im 15/75  bone]
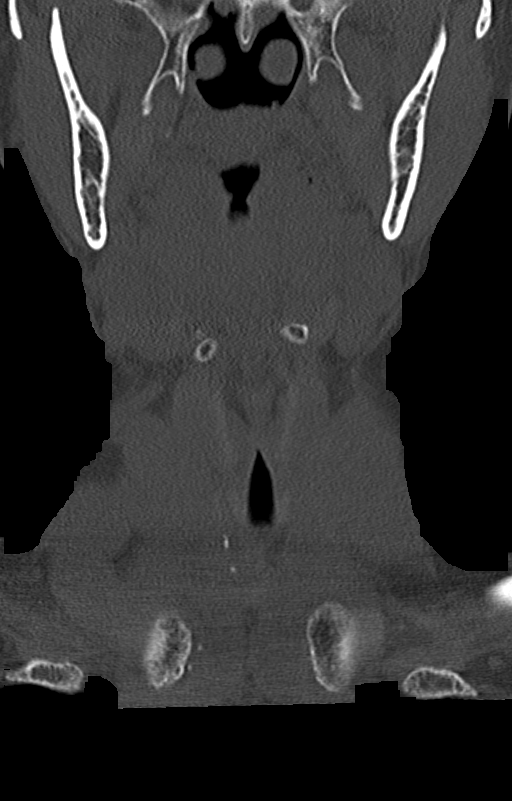
[im 30/75  bone]
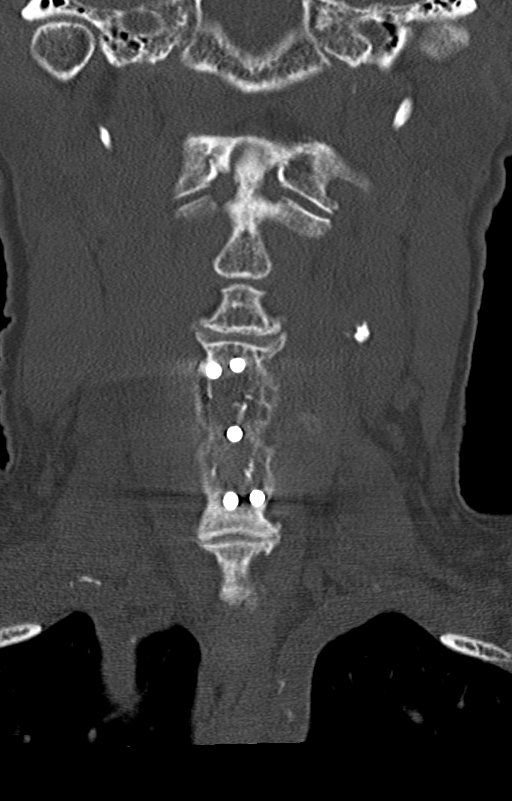
[im 45/75  bone]
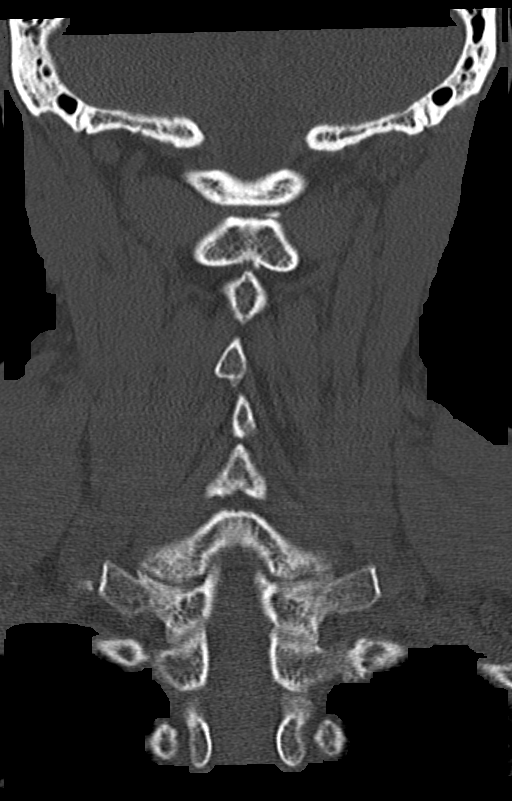

[Series 7: orthogonal axials · axial · 0.21mm/px · z∈[-209,-104]mm · 4 of 93 slices shown, 5 images]
[im 16/93  soft-tissue]
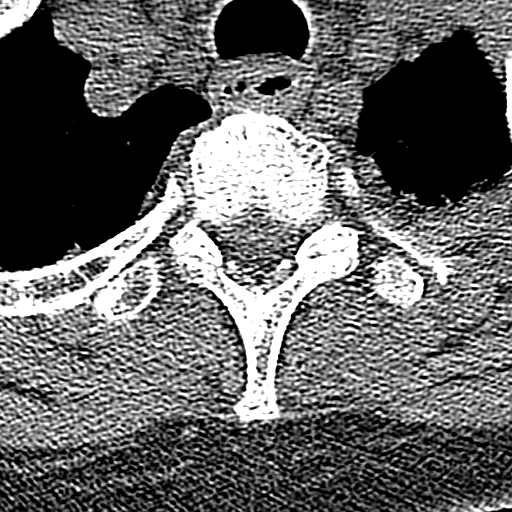
[im 16/93  bone]
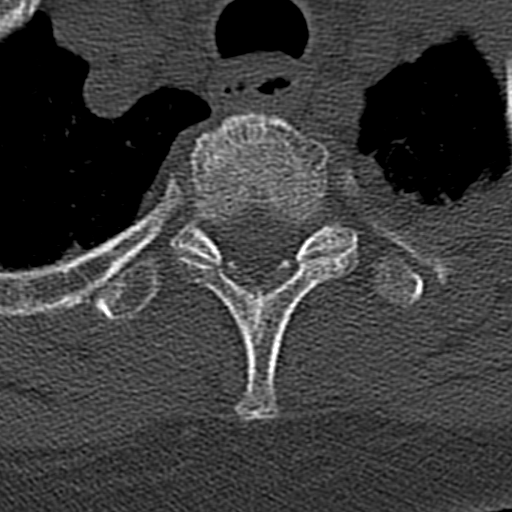
[im 31/93  bone]
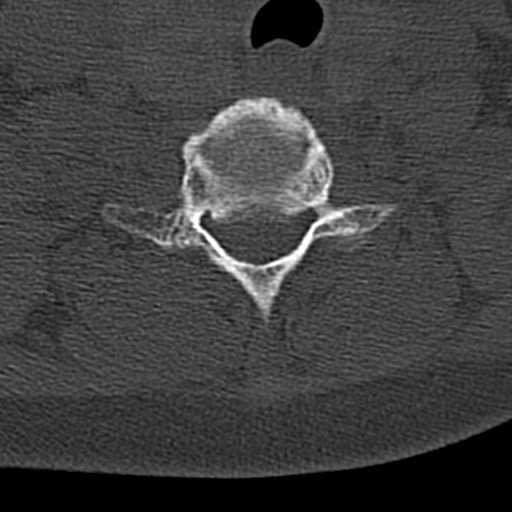
[im 62/93  bone]
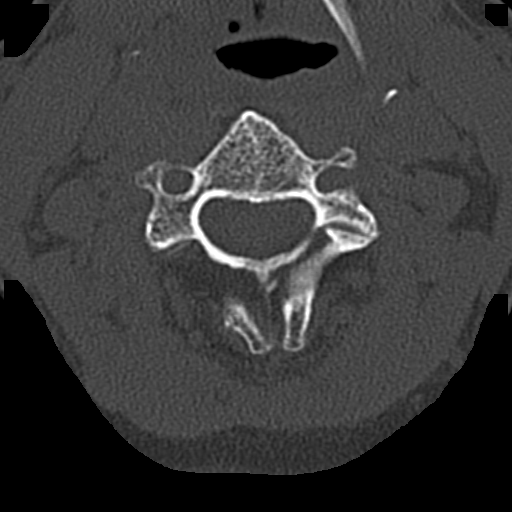
[im 77/93  bone]
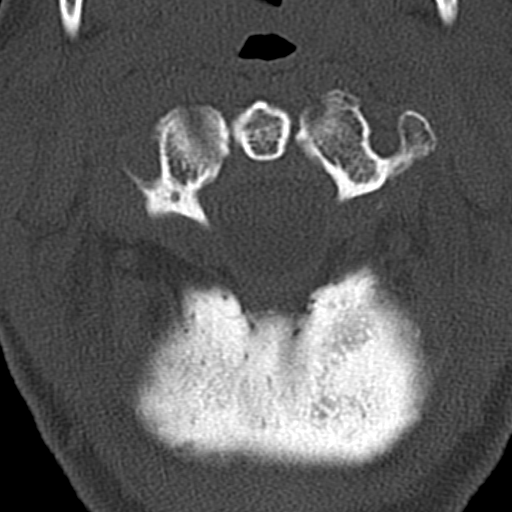

[12 of 33 positions shown; findings below may reference images not displayed]

FINDINGS: Alignment: Trace retrolisthesis at C3-C4.

Skull base and vertebrae: Postoperative changes of anterior fusion
at C4-C6 with plate and screw fixation and well incorporated
interbody grafts. There is no acute fracture.

Soft tissues and spinal canal: No prevertebral fluid or swelling. No
visible canal hematoma.

Disc levels: Partially calcified disc bulge and central disc
protrusion at C3-C4 with marked canal stenosis. Disc osteophyte
complex at C6-C7 with mild canal stenosis. Bilateral foraminal
narrowing at both these levels.

Upper chest: Partially calcified nodule along the pleura in the
posterior right upper lobe is likely stable in comparison to report
of CT chest from 8356. Emphysema.

Other: Enlarged, heterogeneous thyroid. Calcified plaque at the
common carotid bifurcations.
IMPRESSION: No acute cervical spine fracture.

Probable marked canal stenosis at C3-C4 above fusion.

Partially calcified nodule of the posterior right upper lobe is
likely stable in comparison to 8356 chest CT report (images
unavailable).

Enlarged, heterogeneous thyroid. Ultrasound can be considered in the
absence of significant comorbidities/limited life expectancy.

## 2023-03-28 ENCOUNTER — Other Ambulatory Visit: Payer: Self-pay | Admitting: Emergency Medicine

## 2023-03-28 ENCOUNTER — Encounter: Payer: Medicare Other | Attending: Nephrology | Admitting: *Deleted

## 2023-03-28 VITALS — BP 145/65 | HR 64 | Temp 98.1°F | Resp 16

## 2023-03-28 DIAGNOSIS — D631 Anemia in chronic kidney disease: Secondary | ICD-10-CM

## 2023-03-28 DIAGNOSIS — N184 Chronic kidney disease, stage 4 (severe): Secondary | ICD-10-CM

## 2023-03-28 LAB — GLUCOSE HEMOCUE WAIVED: Hemoglobin: 10.3

## 2023-03-28 MED ORDER — EPOETIN ALFA-EPBX 10000 UNIT/ML IJ SOLN
20000.0000 [IU] | Freq: Once | INTRAMUSCULAR | Status: AC
  Administered 2023-03-28: 20000 [IU] via SUBCUTANEOUS

## 2023-03-28 NOTE — Progress Notes (Signed)
Diagnosis: Anemia in Chronic Kidney Disease  Provider:  Annie Sable MD  Procedure: Injection  Retacrit (epoetin alfa-epbx), Dose: 20000 Units, Site: subcutaneous, Number of injections: 1  Hgb. 10.3  Post Care: Observation period completed  Discharge: Condition: Good, Destination: Home . AVS Provided  Performed by:  Daleen Squibb, RN

## 2023-03-29 LAB — RENAL FUNCTION PANEL
Albumin: 3.4 g/dL — ABNORMAL LOW (ref 3.6–5.1)
BUN/Creatinine Ratio: 15 (calc) (ref 6–22)
BUN: 41 mg/dL — ABNORMAL HIGH (ref 7–25)
CO2: 21 mmol/L (ref 20–32)
Calcium: 9.1 mg/dL (ref 8.6–10.4)
Chloride: 108 mmol/L (ref 98–110)
Creat: 2.8 mg/dL — ABNORMAL HIGH (ref 0.60–1.00)
Glucose, Bld: 124 mg/dL — ABNORMAL HIGH (ref 65–99)
Phosphorus: 4.2 mg/dL (ref 2.1–4.3)
Potassium: 3.9 mmol/L (ref 3.5–5.3)
Sodium: 138 mmol/L (ref 135–146)

## 2023-03-29 LAB — PTH, INTACT AND CALCIUM
Calcium: 9.1 mg/dL (ref 8.6–10.4)
PTH: 29 pg/mL (ref 16–77)

## 2023-03-29 LAB — IRON,TIBC AND FERRITIN PANEL
%SAT: 32 % (ref 16–45)
Ferritin: 247 ng/mL (ref 16–288)
Iron: 60 ug/dL (ref 45–160)
TIBC: 189 ug/dL — ABNORMAL LOW (ref 250–450)

## 2023-04-02 DIAGNOSIS — R634 Abnormal weight loss: Secondary | ICD-10-CM | POA: Diagnosis not present

## 2023-04-02 DIAGNOSIS — N179 Acute kidney failure, unspecified: Secondary | ICD-10-CM | POA: Diagnosis not present

## 2023-04-02 DIAGNOSIS — Z7189 Other specified counseling: Secondary | ICD-10-CM | POA: Diagnosis not present

## 2023-04-02 DIAGNOSIS — M069 Rheumatoid arthritis, unspecified: Secondary | ICD-10-CM | POA: Diagnosis not present

## 2023-04-02 DIAGNOSIS — E1122 Type 2 diabetes mellitus with diabetic chronic kidney disease: Secondary | ICD-10-CM | POA: Diagnosis not present

## 2023-04-02 DIAGNOSIS — N2889 Other specified disorders of kidney and ureter: Secondary | ICD-10-CM | POA: Diagnosis not present

## 2023-04-02 DIAGNOSIS — N183 Chronic kidney disease, stage 3 unspecified: Secondary | ICD-10-CM | POA: Diagnosis not present

## 2023-04-02 DIAGNOSIS — D631 Anemia in chronic kidney disease: Secondary | ICD-10-CM | POA: Diagnosis not present

## 2023-04-02 DIAGNOSIS — I129 Hypertensive chronic kidney disease with stage 1 through stage 4 chronic kidney disease, or unspecified chronic kidney disease: Secondary | ICD-10-CM | POA: Diagnosis not present

## 2023-04-25 ENCOUNTER — Encounter: Payer: Medicare Other | Attending: Nephrology | Admitting: *Deleted

## 2023-04-25 ENCOUNTER — Other Ambulatory Visit: Payer: Self-pay | Admitting: Emergency Medicine

## 2023-04-25 ENCOUNTER — Telehealth: Payer: Self-pay | Admitting: Family Medicine

## 2023-04-25 VITALS — BP 120/59 | HR 77 | Temp 98.8°F | Resp 16

## 2023-04-25 DIAGNOSIS — N184 Chronic kidney disease, stage 4 (severe): Secondary | ICD-10-CM | POA: Diagnosis not present

## 2023-04-25 DIAGNOSIS — D631 Anemia in chronic kidney disease: Secondary | ICD-10-CM | POA: Diagnosis not present

## 2023-04-25 LAB — GLUCOSE HEMOCUE WAIVED: Hemoglobin: 9.4

## 2023-04-25 MED ORDER — EPOETIN ALFA-EPBX 10000 UNIT/ML IJ SOLN
20000.0000 [IU] | Freq: Once | INTRAMUSCULAR | Status: AC
Start: 2023-04-25 — End: 2023-04-25
  Administered 2023-04-25: 20000 [IU] via SUBCUTANEOUS

## 2023-04-25 MED ORDER — CLONIDINE HCL 0.1 MG PO TABS
0.1000 mg | ORAL_TABLET | ORAL | Status: DC | PRN
Start: 2023-04-25 — End: 2023-04-25

## 2023-04-25 NOTE — Progress Notes (Signed)
Diagnosis: Anemia in Chronic Kidney Disease  Provider:  Annie Sable MD  Procedure: Injection  Retacrit (epoetin alfa-epbx), Dose: 20000 Units, Site: subcutaneous, Number of injections: 1  Post Care: Observation period completed  Discharge: Condition: Good, Destination: Home . AVS Provided  Performed by:  Daleen Squibb, RN

## 2023-04-25 NOTE — Telephone Encounter (Signed)
Copied from CRM [id number]. Topic: Medical Record Request - Provider/Facility Request >> Apr 25, 2023 12:49 PM Raven B wrote: Reason for CRM: [facility] called to request face to face document be signed and faxed to [phone]

## 2023-04-26 LAB — RENAL FUNCTION PANEL
Albumin: 3.3 g/dL — ABNORMAL LOW (ref 3.6–5.1)
BUN/Creatinine Ratio: 17 (calc) (ref 6–22)
BUN: 54 mg/dL — ABNORMAL HIGH (ref 7–25)
CO2: 22 mmol/L (ref 20–32)
Calcium: 8.6 mg/dL (ref 8.6–10.4)
Chloride: 107 mmol/L (ref 98–110)
Creat: 3.18 mg/dL — ABNORMAL HIGH (ref 0.60–1.00)
Glucose, Bld: 182 mg/dL — ABNORMAL HIGH (ref 65–99)
Phosphorus: 4.5 mg/dL — ABNORMAL HIGH (ref 2.1–4.3)
Potassium: 4.5 mmol/L (ref 3.5–5.3)
Sodium: 138 mmol/L (ref 135–146)

## 2023-04-26 LAB — PTH, INTACT AND CALCIUM
Calcium: 8.6 mg/dL (ref 8.6–10.4)
PTH: 53 pg/mL (ref 16–77)

## 2023-04-26 LAB — IRON,TIBC AND FERRITIN PANEL
%SAT: 28 % (ref 16–45)
Ferritin: 216 ng/mL (ref 16–288)
Iron: 49 ug/dL (ref 45–160)
TIBC: 175 ug/dL — ABNORMAL LOW (ref 250–450)

## 2023-04-26 NOTE — Telephone Encounter (Signed)
[facility] needs F2F notes for [employer], they will also send fax for script for sensors.

## 2023-05-06 NOTE — Progress Notes (Unsigned)
Cardiology Office Note:   Date:  05/08/2023  ID:  Erica Crosby, DOB [birth year], MRN [id number] PCP: Raliegh Ip, DO  [facility] Providers Cardiologist:  Rollene Rotunda, MD {  History of Present Illness:   Erica Crosby is a 77 y.o. female who presents for evaluation of mitral regurgitation.   Echo in 2020 when she was hospitalized at [facility] demonstrated moderate mitral regurgitation.   This was identified at the time of hospitalization for respiratory failure and COVID.  She was seen at [facility] in August 2023 and her proBNP was elevated in the emergency room but chest x-ray was clear.  Troponin was very mildly elevated at 37.   She had significant anemia and elevated creatinine which they thought were probably baseline for her. She was transferred to [facility]   She was found to have on echocardiography well-preserved ejection fraction.  However, she had severe mitral regurgitation.  This had a transthoracic echocardiogram.   She had some trivial pericardial effusion and left pleural effusion.  It was suggested that she might need perfusion imaging in the future but it was not felt that she was having an acute coronary event.  She had acute on chronic renal insufficiency with a creatinine was 3.23. 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     Since I last saw her she has had no new cardiovascular complaints.  She is been talked to by her nephrologist about possibly getting vascular access for dialysis but she is not committed to dialysis.  She has wanted more conservative therapy.  She is followed for her anemia which has been somewhat labile.  She gets around slowly with a cane.  She apparently had some falls.  She is not having any acute complaints such as chest discomfort, neck or arm discomfort.  She has had no new shortness of breath, PND or orthopnea.   ROS: As stated in the HPI and negative for all other systems.  Studies Reviewed:    EKG:    EKG Interpretation Date/Time:  Wednesday May 08 2023 14:19:26 EST Ventricular Rate:  68 PR Interval:  144 QRS Duration:  132 QT Interval:  448 QTC Calculation: 476 R Axis:   26  Text Interpretation: Normal sinus rhythm Right atrial enlargement Non-specific intra-ventricular conduction block Minimal voltage criteria for LVH, may be normal variant ( Cornell product ) Cannot rule out Anteroseptal infarct , age undetermined When compared with ECG of 21-May-2019 16:45, No old tracing to compare Confirmed by Rollene Rotunda ([id number]) on 05/08/2023 2:34:30 PM    Risk Assessment/Calculations:         Physical Exam:   VS:  BP (!) 142/66   Pulse 68   Ht 5\' 6"  (1.676 m)   Wt 115 lb (52.2 kg)   BMI 18.56 kg/m    Wt Readings from Last 3 Encounters:  05/08/23 115 lb (52.2 kg)  01/16/23 116 lb (52.6 kg)  01/07/23 115 lb (52.2 kg)     GEN:   Frail appearing for her age NECK: No JVD; No carotid bruits CARDIAC: RRR, 3 out of 6 apical and axillary holosystolic murmur, no diastolic murmurs, rubs, gallops RESPIRATORY:  Clear to auscultation without rales, wheezing or rhonchi  ABDOMEN: Soft, non-tender, non-distended EXTREMITIES:  No edema; No deformity   ASSESSMENT AND PLAN:   MR:    The MR is severe on echo in 2024.  We had long discussion about this.  She would be high risk  for open surgical repair.  She might be a candidate for percutaneous repair but she does not want to consider this at this point.  She is not having any acute cardiovascular symptoms and for now no change in therapy is indicated.   LBBB:   This is chronic.  No change in therapy.  CKD 4:     This is followed by her primary care provider and nephrologist with discussion as above.   Kidney lesion:   She had an ultrasound with apparent cysts.  She was post to have an MRI but it ended up being an ultrasound as mentioned.   Anemia: Hemoglobin is being followed closely and she is being managed with iron infusions.  She was  having nosebleeds and I encouraged her to see ENT but she did not.  Her daughter says these have improved.   Dyslipidemia: LDL was 77 with HDL 47.  No change in therapy.      Follow up with me in six months.   Signed, Rollene Rotunda, MD

## 2023-05-07 DIAGNOSIS — E1165 Type 2 diabetes mellitus with hyperglycemia: Secondary | ICD-10-CM | POA: Diagnosis not present

## 2023-05-07 NOTE — Telephone Encounter (Signed)
Form and F2F notes faxed to [facility]

## 2023-05-08 ENCOUNTER — Ambulatory Visit: Payer: Medicare Other | Admitting: Cardiology

## 2023-05-08 ENCOUNTER — Encounter: Payer: Self-pay | Admitting: Cardiology

## 2023-05-08 VITALS — BP 142/66 | HR 68 | Ht 66.0 in | Wt 115.0 lb

## 2023-05-08 DIAGNOSIS — N184 Chronic kidney disease, stage 4 (severe): Secondary | ICD-10-CM | POA: Diagnosis not present

## 2023-05-08 DIAGNOSIS — E785 Hyperlipidemia, unspecified: Secondary | ICD-10-CM

## 2023-05-08 DIAGNOSIS — D649 Anemia, unspecified: Secondary | ICD-10-CM

## 2023-05-08 DIAGNOSIS — I340 Nonrheumatic mitral (valve) insufficiency: Secondary | ICD-10-CM

## 2023-05-08 NOTE — Patient Instructions (Signed)
Medication Instructions:  The current medical regimen is effective;  continue present plan and medications.  *If you need a refill on your cardiac medications before your next appointment, please call your pharmacy*  Follow-Up: At [facility], you and your health needs are our priority.  As part of our continuing mission to provide you with exceptional heart care, we have created designated Provider Care Teams.  These Care Teams include your primary Cardiologist (physician) and Advanced Practice Providers (APPs -  Physician Assistants and Nurse Practitioners) who all work together to provide you with the care you need, when you need it.  We recommend signing up for the patient portal called "MyChart".  Sign up information is provided on this After Visit Summary.  MyChart is used to connect with patients for Virtual Visits (Telemedicine).  Patients are able to view lab/test results, encounter notes, upcoming appointments, etc.  Non-urgent messages can be sent to your provider as well.   To learn more about what you can do with MyChart, go to [email or web address].    Your next appointment:   6 month(s)  Provider:   James Hochrein, MD    

## 2023-05-11 ENCOUNTER — Other Ambulatory Visit: Payer: Self-pay | Admitting: Family Medicine

## 2023-05-20 ENCOUNTER — Encounter: Payer: Self-pay | Admitting: Family Medicine

## 2023-05-20 ENCOUNTER — Ambulatory Visit (INDEPENDENT_AMBULATORY_CARE_PROVIDER_SITE_OTHER): Payer: Medicare Other | Admitting: Family Medicine

## 2023-05-20 VITALS — BP 133/64 | HR 62 | Temp 98.6°F | Ht 66.0 in | Wt 113.0 lb

## 2023-05-20 DIAGNOSIS — N184 Chronic kidney disease, stage 4 (severe): Secondary | ICD-10-CM | POA: Diagnosis not present

## 2023-05-20 DIAGNOSIS — E1169 Type 2 diabetes mellitus with other specified complication: Secondary | ICD-10-CM | POA: Diagnosis not present

## 2023-05-20 DIAGNOSIS — E785 Hyperlipidemia, unspecified: Secondary | ICD-10-CM

## 2023-05-20 DIAGNOSIS — E118 Type 2 diabetes mellitus with unspecified complications: Secondary | ICD-10-CM

## 2023-05-20 DIAGNOSIS — Z23 Encounter for immunization: Secondary | ICD-10-CM | POA: Diagnosis not present

## 2023-05-20 DIAGNOSIS — I129 Hypertensive chronic kidney disease with stage 1 through stage 4 chronic kidney disease, or unspecified chronic kidney disease: Secondary | ICD-10-CM

## 2023-05-20 DIAGNOSIS — Z794 Long term (current) use of insulin: Secondary | ICD-10-CM

## 2023-05-20 DIAGNOSIS — E1122 Type 2 diabetes mellitus with diabetic chronic kidney disease: Secondary | ICD-10-CM

## 2023-05-20 LAB — BAYER DCA HB A1C WAIVED: HB A1C (BAYER DCA - WAIVED): 6.3 % — ABNORMAL HIGH (ref 4.8–5.6)

## 2023-05-20 NOTE — Progress Notes (Signed)
Subjective: CC:DM PCP: Raliegh Ip, DO HPI:Erica Crosby is a 77 y.o. female presenting to clinic today for:  1. Type 2 Diabetes with hypertension, hyperlipidemia w/ CKD4:  Patient is brought to the office by her daughter.  Overall she seems to be fairly stable.  Had a checkup with her cardiologist and had a couple of iron infusions since our last visit.  She was stable from a mitral valve standpoint.  She is continued on all medications as directed.  Continues to inject 5 units of Lantus.  No hypoglycemic episodes.  Appetite continues to be fair  Diabetes Health Maintenance Due  Topic Date Due   OPHTHALMOLOGY EXAM  04/27/2023   HEMOGLOBIN A1C  07/19/2023   FOOT EXAM  01/16/2024    Last A1c:  Lab Results  Component Value Date   HGBA1C 7.3 (H) 01/16/2023    ROS: No chest pain, falls.  No edema reported. ROS: Per HPI  Allergies  Allergen Reactions   Iodinated Contrast Media     Unknown reaction    Latex     Unknown reaction    Lisinopril Swelling    lips   Shellfish Allergy     Unknown Reaction    Past Medical History:  Diagnosis Date   Anemia    Arthritis    Cataract    Coronary artery disease    Mild plaque 2012   Diabetes mellitus    Essential hypertension 02/10/2012   GERD (gastroesophageal reflux disease)    Hyperlipidemia    Hypertension    Neuropathy    Peptic ulcer    Rheumatoid arthritis (HCC)    Syncope 02/10/2012   TIA (transient ischemic attack) 02/10/2012    Current Outpatient Medications:    amLODipine (NORVASC) 2.5 MG tablet, Take 1 tablet (2.5 mg total) by mouth daily., Disp: 90 tablet, Rfl: 3   aspirin EC 81 MG tablet, Take 81 mg by mouth daily., Disp: , Rfl:    atorvastatin (LIPITOR) 10 MG tablet, Take 1 tablet (10 mg total) by mouth daily., Disp: 90 tablet, Rfl: 3   Cholecalciferol 125 MCG (5000 UT) TABS, Take 5,000 Units by mouth daily., Disp: , Rfl:    clobetasol (TEMOVATE) 0.05 % external solution, Apply 1 Application topically  daily as needed (scalp irriration)., Disp: 50 mL, Rfl: 1   Continuous Glucose Sensor ([name] 2 SENSOR) MISC, USE TO TEST BLOOD SUGAR CONTINUOUSLY. Dx: E11.65. ADVANCED DIABETES SUPPLY VIA PARACHUTE, Disp: 6 each, Rfl: 3   folic acid (FOLVITE) 1 MG tablet, Take 1 tablet (1 mg total) by mouth daily., Disp: 90 tablet, Rfl: 3   insulin glargine (LANTUS SOLOSTAR) 100 UNIT/ML Solostar Pen, Inject 5-10 Units into the skin at bedtime., Disp: 15 mL, Rfl: 2   Omega-3 Fatty Acids (FISH OIL) 1000 MG CAPS, Take 1,000 mg by mouth daily., Disp: , Rfl:    pantoprazole (PROTONIX) 40 MG tablet, TAKE ONE TABLET DAILY, Disp: 90 tablet, Rfl: 0 Social History   Socioeconomic History   Marital status: Widowed    Spouse name: Not on file   Number of children: 3   Years of education: 35   Highest education level: High school graduate  Occupational History   Occupation: retired  Tobacco Use   Smoking status: Former    Current packs/day: 0.00    Types: Cigarettes, E-cigarettes    Start date: 02/03/1988    Quit date: 02/02/2013    Years since quitting: 10.2   Smokeless tobacco: Never  Vaping Use  Vaping status: Never Used  Substance and Sexual Activity   Alcohol use: No   Drug use: No   Sexual activity: Not Currently    Birth control/protection: Post-menopausal  Other Topics Concern   Not on file  Social History Narrative   Daughter stays with her.    Son comes daily when daughter is working   Chemical engineer Strain: Low Risk  (11/16/2022)   Overall Financial Resource Strain (CARDIA)    Difficulty of Paying Living Expenses: Not hard at all  Food Insecurity: No Food Insecurity (11/16/2022)   Hunger Vital Sign    Worried About Running Out of Food in the Last Year: Never true    Ran Out of Food in the Last Year: Never true  Transportation Needs: Unmet Transportation Needs (11/16/2022)   PRAPARE - Administrator, Civil Service (Medical): Yes    Lack  of Transportation (Non-Medical): No  Physical Activity: Insufficiently Active (11/16/2022)   Exercise Vital Sign    Days of Exercise per Week: 7 days    Minutes of Exercise per Session: 20 min  Stress: No Stress Concern Present (11/16/2022)   [employer] of Occupational Health - Occupational Stress Questionnaire    Feeling of Stress : Not at all  Social Connections: Moderately Integrated (11/16/2022)   Social Connection and Isolation Panel [NHANES]    Frequency of Communication with Friends and Family: Once a week    Frequency of Social Gatherings with Friends and Family: Once a week    Attends Religious Services: 1 to 4 times per year    Active Member of [employer] or Organizations: Yes    Attends Engineer, structural: More than 4 times per year    Marital Status: Married  Catering manager Violence: Not At Risk (11/16/2022)   Humiliation, Afraid, Rape, and Kick questionnaire    Fear of Current or Ex-Partner: No    Emotionally Abused: No    Physically Abused: No    Sexually Abused: No   Family History  Problem Relation Age of Onset   Heart disease Sister        Congeital (died age 76) with "enlarged heart"   CAD Sister    Diabetes Mother    Heart disease Mother        Later onset heart disease    Dementia Mother    Alcohol abuse Father    Liver disease Father    CAD Sister 14       CABG   Early death Sister    Diabetes Brother    Kidney disease Brother        related to DM   Diabetes Brother     Objective: Office vital signs reviewed. BP 133/64   Pulse 62   Temp 98.6 F (37 C)   Ht 5\' 6"  (1.676 m)   Wt 113 lb (51.3 kg)   SpO2 98%   BMI 18.24 kg/m   Physical Examination:  General: Awake, alert, thin female, No acute distress HEENT: Sclera white. Cardio: regular rate and rhythm, S1S2 heard, no murmurs appreciated Pulm: clear to auscultation bilaterally, no wheezes, rhonchi or rales; normal work of breathing on room air  Assessment/ Plan: 77 y.o. 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  Could consider backing down on insulin further but not having any hypoglycemia.  Continue to follow-up with nephrology for CKD for monitoring and management.  Continue statin for cholesterol control.  Recommend diabetic eye exam.  Influenza vaccination administered.  Tetanus administered.  May follow-up in 4 to 6 months, sooner if concerns arise  Raliegh Ip, DO [facility] [phone]

## 2023-05-20 NOTE — Patient Instructions (Signed)
Fasting labs next visit. Set up diabetic eye exam

## 2023-05-23 ENCOUNTER — Other Ambulatory Visit: Payer: Self-pay | Admitting: Emergency Medicine

## 2023-05-23 ENCOUNTER — Encounter: Payer: Medicare Other | Attending: Nephrology | Admitting: *Deleted

## 2023-05-23 VITALS — BP 106/74 | HR 66 | Temp 97.7°F | Resp 16

## 2023-05-23 DIAGNOSIS — D631 Anemia in chronic kidney disease: Secondary | ICD-10-CM | POA: Diagnosis not present

## 2023-05-23 DIAGNOSIS — N184 Chronic kidney disease, stage 4 (severe): Secondary | ICD-10-CM | POA: Insufficient documentation

## 2023-05-23 LAB — GLUCOSE HEMOCUE WAIVED: Hemoglobin: 9.1

## 2023-05-23 MED ORDER — EPOETIN ALFA-EPBX 10000 UNIT/ML IJ SOLN
20000.0000 [IU] | Freq: Once | INTRAMUSCULAR | Status: AC
Start: 2023-05-23 — End: 2023-05-23
  Administered 2023-05-23: 20000 [IU] via SUBCUTANEOUS

## 2023-05-23 MED ORDER — CLONIDINE HCL 0.1 MG PO TABS: 0.1000 mg | ORAL_TABLET | ORAL | Status: DC | PRN

## 2023-05-23 NOTE — Progress Notes (Signed)
 Diagnosis: Anemia in Chronic Kidney Disease  Provider:  Annie Sable MD  Procedure: Injection  Retacrit (epoetin alfa-epbx), Dose: 20000 Units, Site: subcutaneous, Number of injections: 1  Post Care: Observation period completed  Discharge: Condition: Good, Destination: Home . AVS Provided  Performed by:  Daleen Squibb, RN

## 2023-05-24 LAB — RENAL FUNCTION PANEL
Albumin: 3.5 g/dL — ABNORMAL LOW (ref 3.6–5.1)
BUN/Creatinine Ratio: 22 (calc) (ref 6–22)
BUN: 70 mg/dL — ABNORMAL HIGH (ref 7–25)
CO2: 22 mmol/L (ref 20–32)
Calcium: 9.1 mg/dL (ref 8.6–10.4)
Chloride: 104 mmol/L (ref 98–110)
Creat: 3.2 mg/dL — ABNORMAL HIGH (ref 0.60–1.00)
Glucose, Bld: 97 mg/dL (ref 65–99)
Phosphorus: 4.8 mg/dL — ABNORMAL HIGH (ref 2.1–4.3)
Potassium: 4.9 mmol/L (ref 3.5–5.3)
Sodium: 137 mmol/L (ref 135–146)

## 2023-05-24 LAB — SPECIMEN COMPROMISED

## 2023-05-24 LAB — IRON,TIBC AND FERRITIN PANEL
%SAT: 36 % (ref 16–45)
Ferritin: 217 ng/mL (ref 16–288)
Iron: 71 ug/dL (ref 45–160)
TIBC: 199 ug/dL — ABNORMAL LOW (ref 250–450)

## 2023-05-24 LAB — PTH, INTACT AND CALCIUM
Calcium: 9.1 mg/dL (ref 8.6–10.4)
PTH: 36 pg/mL (ref 16–77)

## 2023-06-05 DIAGNOSIS — Z7189 Other specified counseling: Secondary | ICD-10-CM | POA: Diagnosis not present

## 2023-06-05 DIAGNOSIS — N183 Chronic kidney disease, stage 3 unspecified: Secondary | ICD-10-CM | POA: Diagnosis not present

## 2023-06-05 DIAGNOSIS — D631 Anemia in chronic kidney disease: Secondary | ICD-10-CM | POA: Diagnosis not present

## 2023-06-05 DIAGNOSIS — R634 Abnormal weight loss: Secondary | ICD-10-CM | POA: Diagnosis not present

## 2023-06-05 DIAGNOSIS — M069 Rheumatoid arthritis, unspecified: Secondary | ICD-10-CM | POA: Diagnosis not present

## 2023-06-05 DIAGNOSIS — I129 Hypertensive chronic kidney disease with stage 1 through stage 4 chronic kidney disease, or unspecified chronic kidney disease: Secondary | ICD-10-CM | POA: Diagnosis not present

## 2023-06-05 DIAGNOSIS — N2889 Other specified disorders of kidney and ureter: Secondary | ICD-10-CM | POA: Diagnosis not present

## 2023-06-05 DIAGNOSIS — E1122 Type 2 diabetes mellitus with diabetic chronic kidney disease: Secondary | ICD-10-CM | POA: Diagnosis not present

## 2023-06-05 DIAGNOSIS — N179 Acute kidney failure, unspecified: Secondary | ICD-10-CM | POA: Diagnosis not present

## 2023-06-20 ENCOUNTER — Other Ambulatory Visit: Payer: Self-pay | Admitting: Internal Medicine

## 2023-06-20 ENCOUNTER — Encounter: Payer: Medicare Other | Attending: Nephrology | Admitting: *Deleted

## 2023-06-20 ENCOUNTER — Telehealth: Payer: Self-pay

## 2023-06-20 ENCOUNTER — Other Ambulatory Visit: Payer: Self-pay

## 2023-06-20 ENCOUNTER — Telehealth: Payer: Self-pay | Admitting: Family Medicine

## 2023-06-20 VITALS — BP 111/69 | HR 73 | Temp 98.3°F | Resp 16

## 2023-06-20 DIAGNOSIS — N183 Chronic kidney disease, stage 3 unspecified: Secondary | ICD-10-CM | POA: Insufficient documentation

## 2023-06-20 DIAGNOSIS — D631 Anemia in chronic kidney disease: Secondary | ICD-10-CM

## 2023-06-20 DIAGNOSIS — N184 Chronic kidney disease, stage 4 (severe): Secondary | ICD-10-CM | POA: Diagnosis not present

## 2023-06-20 LAB — GLUCOSE HEMOCUE WAIVED: Hemoglobin: 10.4

## 2023-06-20 MED ORDER — CLONIDINE HCL 0.1 MG PO TABS: 0.1000 mg | ORAL_TABLET | ORAL | Status: DC | PRN

## 2023-06-20 MED ORDER — EPOETIN ALFA-EPBX 20000 UNIT/ML IJ SOLN
20000.0000 [IU] | Freq: Once | INTRAMUSCULAR | Status: AC
  Administered 2023-06-20: 20000 [IU] via SUBCUTANEOUS

## 2023-06-20 NOTE — Telephone Encounter (Signed)
 Auth Submission: NO AUTH NEEDED Site of care: Site of care: [facility] Payer: [facility] medicare Medication & CPT/J Code(s) submitted: retacrit  [id number] Route of submission (phone, fax, portal): portal Phone # Fax # Auth type: Buy/Bill PB Units/visits requested: 20,000,q4weeks Reference number: [id number]  Approval from: 06/20/23 to 06/17/24

## 2023-06-20 NOTE — Progress Notes (Signed)
 Diagnosis: Anemia in Chronic Kidney Disease  Provider:  Charley Constable MD  Procedure: Injection  Retacrit  (epoetin  alfa-epbx), Dose: 20000 Units, Site: subcutaneous, Number of injections: 1  Injection Site(s): Right upper quad. abdomen  Post Care: Observation period completed  Discharge: Condition: Good, Destination: Home . AVS Provided  Performed by:  Verneda Golder, RN

## 2023-06-21 LAB — IRON,TIBC AND FERRITIN PANEL
%SAT: 25 % (ref 16–45)
Ferritin: 227 ng/mL (ref 16–288)
Iron: 45 ug/dL (ref 45–160)
TIBC: 181 ug/dL — ABNORMAL LOW (ref 250–450)

## 2023-06-21 LAB — RENAL FUNCTION PANEL
Albumin: 3.4 g/dL — ABNORMAL LOW (ref 3.6–5.1)
BUN/Creatinine Ratio: 17 (calc) (ref 6–22)
BUN: 56 mg/dL — ABNORMAL HIGH (ref 7–25)
CO2: 21 mmol/L (ref 20–32)
Calcium: 9.6 mg/dL (ref 8.6–10.4)
Chloride: 103 mmol/L (ref 98–110)
Creat: 3.29 mg/dL — ABNORMAL HIGH (ref 0.60–1.00)
Glucose, Bld: 206 mg/dL — ABNORMAL HIGH (ref 65–99)
Phosphorus: 4.8 mg/dL — ABNORMAL HIGH (ref 2.1–4.3)
Potassium: 3.7 mmol/L (ref 3.5–5.3)
Sodium: 135 mmol/L (ref 135–146)

## 2023-06-21 LAB — PTH, INTACT AND CALCIUM
Calcium: 9.6 mg/dL (ref 8.6–10.4)
PTH: 6 pg/mL — ABNORMAL LOW (ref 16–77)

## 2023-06-28 ENCOUNTER — Other Ambulatory Visit: Payer: Self-pay

## 2023-06-28 DIAGNOSIS — N186 End stage renal disease: Secondary | ICD-10-CM

## 2023-07-01 ENCOUNTER — Ambulatory Visit (HOSPITAL_COMMUNITY)
Admission: RE | Admit: 2023-07-01 | Discharge: 2023-07-01 | Disposition: A | Discharge status: Home / Self Care | Payer: Medicare Other | Source: Ambulatory Visit | Attending: Vascular Surgery | Admitting: Vascular Surgery

## 2023-07-01 ENCOUNTER — Ambulatory Visit (HOSPITAL_COMMUNITY)
Admission: RE | Admit: 2023-07-01 | Discharge: 2023-07-01 | Disposition: A | Discharge status: Home / Self Care | Payer: Medicare Other | Source: Ambulatory Visit | Attending: Vascular Surgery

## 2023-07-01 DIAGNOSIS — N186 End stage renal disease: Secondary | ICD-10-CM

## 2023-07-02 ENCOUNTER — Encounter: Payer: Self-pay | Admitting: Vascular Surgery

## 2023-07-02 ENCOUNTER — Ambulatory Visit: Payer: Medicare Other | Admitting: Vascular Surgery

## 2023-07-02 VITALS — BP 131/69 | HR 73 | Ht 66.0 in | Wt 104.6 lb

## 2023-07-02 DIAGNOSIS — N184 Chronic kidney disease, stage 4 (severe): Secondary | ICD-10-CM | POA: Diagnosis not present

## 2023-07-03 NOTE — Progress Notes (Signed)
 [facility] [place]  ASSESSMENT / PLAN: Erica Crosby is a 78 y.o. right handed female in need of permanent dialysis access. I reviewed options for dialysis in detail with the patient, including hemodialysis and peritoneal dialysis. I counseled the patient to ask their nephrologist about their candidacy for renal transplant. I counseled the patient that dialysis access requires surveillance and periodic maintenance. Plan to perform left arm arteriovenous graft whenever nephrology would like us  to proceed.   CHIEF COMPLAINT: Worsening renal failure  HISTORY OF PRESENT ILLNESS: Erica Crosby is a 79 y.o. female referred to clinic for evaluation of permanent dialysis access.  The patient is a right-handed woman.  She is essentially noncommunicative in clinic.  Her daughter is with her and provides all the history.  Patient has suffered deteriorating renal function over the past years.  She is nearing dialysis dependence.  She has never had dialysis access surgery before.   Past Medical History:  Diagnosis Date   Anemia    Arthritis    Cataract    Coronary artery disease    Mild plaque 2012   Diabetes mellitus    Essential hypertension 02/10/2012   GERD (gastroesophageal reflux disease)    Hyperlipidemia    Hypertension    Neuropathy    Peptic ulcer    Rheumatoid arthritis (HCC)    Syncope 02/10/2012   TIA (transient ischemic attack) 02/10/2012    Past Surgical History:  Procedure Laterality Date   ABSCESS DRAINAGE     CARPAL TUNNEL RELEASE Left    CHOLECYSTECTOMY     KNEE SURGERY Right    NECK SURGERY     x 2   SHOULDER SURGERY Left    TEE WITHOUT CARDIOVERSION N/A 03/12/2022   Procedure: TRANSESOPHAGEAL ECHOCARDIOGRAM (TEE);  Surgeon: Francyne Headland, MD;  Location: [facility] ENDOSCOPY;  Service: Cardiovascular;  Laterality: N/A;    Family History  Problem Relation Age of Onset   Heart disease Sister        Congeital (died age 53) with enlarged heart   CAD  Sister    Diabetes Mother    Heart disease Mother        Later onset heart disease    Dementia Mother    Alcohol abuse Father    Liver disease Father    CAD Sister 8       CABG   Early death Sister    Diabetes Brother    Kidney disease Brother        related to DM   Diabetes Brother     Social History   Socioeconomic History   Marital status: Widowed    Spouse name: Not on file   Number of children: 3   Years of education: 21   Highest education level: High school graduate  Occupational History   Occupation: retired  Tobacco Use   Smoking status: Former    Current packs/day: 0.00    Types: Cigarettes, E-cigarettes    Start date: 02/03/1988    Quit date: 02/02/2013    Years since quitting: 10.4   Smokeless tobacco: Never  Vaping Use   Vaping status: Never Used  Substance and Sexual Activity   Alcohol use: No   Drug use: No   Sexual activity: Not Currently    Birth control/protection: Post-menopausal  Other Topics Concern   Not on file  Social History Narrative   Daughter stays with her.    Son comes daily when daughter is working   Chief Executive Officer  Drivers of Health   Financial Resource Strain: Low Risk  (11/16/2022)   Overall Financial Resource Strain (CARDIA)    Difficulty of Paying Living Expenses: Not hard at all  Food Insecurity: No Food Insecurity (11/16/2022)   Hunger Vital Sign    Worried About Running Out of Food in the Last Year: Never true    Ran Out of Food in the Last Year: Never true  Transportation Needs: Unmet Transportation Needs (11/16/2022)   PRAPARE - Administrator, Civil Service (Medical): Yes    Lack of Transportation (Non-Medical): No  Physical Activity: Insufficiently Active (11/16/2022)   Exercise Vital Sign    Days of Exercise per Week: 7 days    Minutes of Exercise per Session: 20 min  Stress: No Stress Concern Present (11/16/2022)   [employer] of Occupational Health - Occupational Stress Questionnaire    Feeling of Stress  : Not at all  Social Connections: Moderately Integrated (11/16/2022)   Social Connection and Isolation Panel [NHANES]    Frequency of Communication with Friends and Family: Once a week    Frequency of Social Gatherings with Friends and Family: Once a week    Attends Religious Services: 1 to 4 times per year    Active Member of [employer] or Organizations: Yes    Attends Engineer, Structural: More than 4 times per year    Marital Status: Married  Catering Manager Violence: Not At Risk (11/16/2022)   Humiliation, Afraid, Rape, and Kick questionnaire    Fear of Current or Ex-Partner: No    Emotionally Abused: No    Physically Abused: No    Sexually Abused: No    Allergies  Allergen Reactions   Iodinated Contrast Media     Unknown reaction    Latex     Unknown reaction    Lisinopril Swelling    lips   Shellfish Allergy     Unknown Reaction     Current Outpatient Medications  Medication Sig Dispense Refill   amLODipine  (NORVASC ) 2.5 MG tablet Take 1 tablet (2.5 mg total) by mouth daily. 90 tablet 3   aspirin  EC 81 MG tablet Take 81 mg by mouth daily.     atorvastatin  (LIPITOR) 10 MG tablet Take 1 tablet (10 mg total) by mouth daily. 90 tablet 3   carvedilol  (COREG ) 6.25 MG tablet Take 6.25 mg by mouth 2 (two) times daily.     Cholecalciferol  125 MCG (5000 UT) TABS Take 5,000 Units by mouth daily.     Continuous Glucose Sensor ([name] 2 SENSOR) MISC USE TO TEST BLOOD SUGAR CONTINUOUSLY. Dx: E11.65. ADVANCED DIABETES SUPPLY VIA PARACHUTE 6 each 3   folic acid  (FOLVITE ) 1 MG tablet Take 1 tablet (1 mg total) by mouth daily. 90 tablet 3   insulin  glargine (LANTUS  SOLOSTAR) 100 UNIT/ML Solostar Pen Inject 5-10 Units into the skin at bedtime. 15 mL 2   Omega-3 Fatty Acids (FISH OIL) 1000 MG CAPS Take 1,000 mg by mouth daily.     pantoprazole  (PROTONIX ) 40 MG tablet TAKE ONE TABLET DAILY 90 tablet 0   clobetasol  (TEMOVATE ) 0.05 % external solution Apply 1 Application topically  daily as needed (scalp irriration). (Patient not taking: Reported on 07/02/2023) 50 mL 1   No current facility-administered medications for this visit.    PHYSICAL EXAM Vitals:   07/02/23 1533  BP: 131/69  Pulse: 73  Weight: 104 lb 9.6 oz (47.4 kg)  Height: 5' 6 (1.676 m)   Elderly frail  woman in no distress Regular rate and rhythm Unlabored breathing Palpable radial pulses bilaterally  PERTINENT LABORATORY AND RADIOLOGIC DATA  Most recent CBC    Latest Ref Rng & Units 01/03/2023    1:13 PM 12/19/2022    1:26 PM 12/06/2022    1:37 PM  CBC  Hemoglobin 12.0 - 15.0 g/dL 87.7  87.5  87.7      Most recent CMP    Latest Ref Rng & Units 06/20/2023    1:59 PM 05/23/2023    1:35 PM 04/25/2023    2:09 PM  CMP  Glucose 65 - 99 mg/dL 793  97  817   BUN 7 - 25 mg/dL 56  70  54   Creatinine 0.60 - 1.00 mg/dL 6.70  6.79  6.81   Sodium 135 - 146 mmol/L 135  137  138   Potassium 3.5 - 5.3 mmol/L 3.7  4.9  4.5   Chloride 98 - 110 mmol/L 103  104  107   CO2 20 - 32 mmol/L 21  22  22    Calcium  8.6 - 10.4 mg/dL 8.6 - 89.5 mg/dL 9.6    9.6  9.1    9.1  8.6    8.6     Renal function Estimated Creatinine Clearance: 10.9 mL/min (A) (by C-G formula based on SCr of 3.29 mg/dL (H)).  Hemoglobin A1C (no units)  Date Value  10/31/2016 14   HB A1C (BAYER DCA - WAIVED) (%)  Date Value  05/20/2023 6.3 (H)    LDL Chol Calc (NIH)  Date Value Ref Range Status  10/14/2020 77 0 - 99 mg/dL Final    Vein mapping personally reviewed.  No suitable superficial vein for arteriovenous fistula creation.  Debby SAILOR. Magda, MD [facility] Phone Number: [phone] 07/03/2023 1:41 PM   Total time spent on preparing this encounter including chart review, data review, collecting history, examining the patient, coordinating care for this new patient, 60 minutes.  Portions of this report may have been transcribed using voice recognition software.  Every  effort has been made to ensure accuracy; however, inadvertent computerized transcription errors may still be present.

## 2023-07-11 ENCOUNTER — Ambulatory Visit (HOSPITAL_COMMUNITY)
Admission: RE | Admit: 2023-07-11 | Discharge: 2023-07-11 | Disposition: A | Discharge status: Home / Self Care | Payer: Medicare Other | Source: Ambulatory Visit | Attending: Urology | Admitting: Urology

## 2023-07-11 DIAGNOSIS — N2889 Other specified disorders of kidney and ureter: Secondary | ICD-10-CM | POA: Diagnosis not present

## 2023-07-11 DIAGNOSIS — N281 Cyst of kidney, acquired: Secondary | ICD-10-CM | POA: Diagnosis not present

## 2023-07-15 ENCOUNTER — Ambulatory Visit: Payer: Medicare Other | Admitting: Urology

## 2023-07-18 ENCOUNTER — Ambulatory Visit: Payer: Medicare Other

## 2023-07-25 ENCOUNTER — Other Ambulatory Visit: Payer: Self-pay | Admitting: Internal Medicine

## 2023-07-25 ENCOUNTER — Encounter: Payer: Medicare Other | Attending: Nephrology | Admitting: *Deleted

## 2023-07-25 ENCOUNTER — Other Ambulatory Visit: Payer: Self-pay | Admitting: *Deleted

## 2023-07-25 ENCOUNTER — Other Ambulatory Visit: Payer: Self-pay | Admitting: Nephrology

## 2023-07-25 VITALS — BP 116/66 | HR 64 | Temp 98.0°F | Resp 16

## 2023-07-25 DIAGNOSIS — N183 Chronic kidney disease, stage 3 unspecified: Secondary | ICD-10-CM | POA: Diagnosis not present

## 2023-07-25 DIAGNOSIS — D631 Anemia in chronic kidney disease: Secondary | ICD-10-CM | POA: Insufficient documentation

## 2023-07-25 DIAGNOSIS — N1832 Chronic kidney disease, stage 3b: Secondary | ICD-10-CM

## 2023-07-25 LAB — GLUCOSE HEMOCUE WAIVED: Hemoglobin: 10.8

## 2023-07-25 MED ORDER — EPOETIN ALFA-EPBX 20000 UNIT/ML IJ SOLN
20000.0000 [IU] | Freq: Once | INTRAMUSCULAR | Status: AC
  Administered 2023-07-25: 20000 [IU] via SUBCUTANEOUS
  Filled 2023-07-25: qty 1

## 2023-07-25 NOTE — Progress Notes (Signed)
 Diagnosis: Anemia in Chronic Kidney Disease  Provider:  Prescilla Beams MD  Procedure: Injection  Retacrit  (epoetin  alfa-epbx), Dose: 20000 Units, Site: subcutaneous, Number of injections: 1  Injection Site(s): Left upper quad. abdomen  Post Care: Observation period completed  Discharge: Condition: Good, Destination: Home . AVS Provided  Performed by:  Baldwin Darice Helling, RN

## 2023-07-26 LAB — PTH, INTACT AND CALCIUM
Calcium: 11.1 mg/dL — ABNORMAL HIGH (ref 8.6–10.4)
PTH: <6 pg/mL — ABNORMAL LOW (ref 16–77)

## 2023-07-26 LAB — IRON,TIBC AND FERRITIN PANEL
%SAT: 24 % (ref 16–45)
Ferritin: 267 ng/mL (ref 16–288)
Iron: 55 ug/dL (ref 45–160)
TIBC: 225 ug/dL — ABNORMAL LOW (ref 250–450)

## 2023-07-26 LAB — RENAL FUNCTION PANEL
Albumin: 3.4 g/dL — ABNORMAL LOW (ref 3.6–5.1)
BUN/Creatinine Ratio: 14 (calc) (ref 6–22)
BUN: 62 mg/dL — ABNORMAL HIGH (ref 7–25)
CO2: 21 mmol/L (ref 20–32)
Calcium: 10.9 mg/dL — ABNORMAL HIGH (ref 8.6–10.4)
Chloride: 99 mmol/L (ref 98–110)
Creat: 4.52 mg/dL — ABNORMAL HIGH (ref 0.60–1.00)
Glucose, Bld: 103 mg/dL — ABNORMAL HIGH (ref 65–99)
Phosphorus: 5.3 mg/dL — ABNORMAL HIGH (ref 2.1–4.3)
Potassium: 4.3 mmol/L (ref 3.5–5.3)
Sodium: 135 mmol/L (ref 135–146)

## 2023-07-29 ENCOUNTER — Telehealth: Payer: Self-pay

## 2023-07-29 NOTE — Telephone Encounter (Signed)
 Attempted to call for surgery scheduling. LVM

## 2023-08-02 ENCOUNTER — Telehealth: Payer: Self-pay

## 2023-08-02 NOTE — Telephone Encounter (Signed)
Attempted to return call to patient's daughter.  Need to know if patient has a nephrology (kidney) doctor follow up prior to performing surgery, per Dr. Lenell Antu.

## 2023-08-06 ENCOUNTER — Telehealth: Payer: Self-pay

## 2023-08-06 NOTE — Telephone Encounter (Signed)
Copied from CRM [id number]. Topic: Clinical - Medical Advice >> Aug 06, 2023  1:01 PM Shelah Lewandowsky wrote: Reason for CRM: daughter Cathlyn Parsons concerned about possible UTI, patient is acting more confused and wandered off the other day, she is convinced that Trump is going to set off bombs, please call daughter at [phone]

## 2023-08-07 ENCOUNTER — Other Ambulatory Visit: Payer: Self-pay | Admitting: Family Medicine

## 2023-08-09 ENCOUNTER — Ambulatory Visit: Payer: Medicare Other

## 2023-08-09 ENCOUNTER — Emergency Department (HOSPITAL_COMMUNITY)
Admission: EM | Admit: 2023-08-09 | Discharge: 2023-08-09 | Disposition: A | Discharge status: Home / Self Care | Payer: Medicare Other | Attending: Emergency Medicine | Admitting: Emergency Medicine

## 2023-08-09 ENCOUNTER — Emergency Department (HOSPITAL_COMMUNITY): Payer: Medicare Other

## 2023-08-09 DIAGNOSIS — E1140 Type 2 diabetes mellitus with diabetic neuropathy, unspecified: Secondary | ICD-10-CM | POA: Diagnosis not present

## 2023-08-09 DIAGNOSIS — Z91040 Latex allergy status: Secondary | ICD-10-CM | POA: Insufficient documentation

## 2023-08-09 DIAGNOSIS — E1122 Type 2 diabetes mellitus with diabetic chronic kidney disease: Secondary | ICD-10-CM | POA: Insufficient documentation

## 2023-08-09 DIAGNOSIS — Z992 Dependence on renal dialysis: Secondary | ICD-10-CM | POA: Diagnosis not present

## 2023-08-09 DIAGNOSIS — Z7982 Long term (current) use of aspirin: Secondary | ICD-10-CM | POA: Insufficient documentation

## 2023-08-09 DIAGNOSIS — I6523 Occlusion and stenosis of bilateral carotid arteries: Secondary | ICD-10-CM | POA: Diagnosis not present

## 2023-08-09 DIAGNOSIS — N186 End stage renal disease: Secondary | ICD-10-CM | POA: Insufficient documentation

## 2023-08-09 DIAGNOSIS — F0390 Unspecified dementia without behavioral disturbance: Secondary | ICD-10-CM | POA: Diagnosis not present

## 2023-08-09 DIAGNOSIS — I120 Hypertensive chronic kidney disease with stage 5 chronic kidney disease or end stage renal disease: Secondary | ICD-10-CM | POA: Diagnosis not present

## 2023-08-09 DIAGNOSIS — R410 Disorientation, unspecified: Secondary | ICD-10-CM | POA: Insufficient documentation

## 2023-08-09 LAB — CBC
HCT: 33.4 % — ABNORMAL LOW (ref 36.0–46.0)
Hemoglobin: 10.4 g/dL — ABNORMAL LOW (ref 12.0–15.0)
MCH: 27.4 pg (ref 26.0–34.0)
MCHC: 31.1 g/dL (ref 30.0–36.0)
MCV: 88.1 fL (ref 80.0–100.0)
Platelets: 253 10*3/uL (ref 150–400)
RBC: 3.79 MIL/uL — ABNORMAL LOW (ref 3.87–5.11)
RDW: 16.6 % — ABNORMAL HIGH (ref 11.5–15.5)
WBC: 5.6 10*3/uL (ref 4.0–10.5)
nRBC: 0 % (ref 0.0–0.2)

## 2023-08-09 LAB — COMPREHENSIVE METABOLIC PANEL
ALT: 14 U/L (ref 0–44)
AST: 18 U/L (ref 15–41)
Albumin: 2.9 g/dL — ABNORMAL LOW (ref 3.5–5.0)
Alkaline Phosphatase: 54 U/L (ref 38–126)
Anion gap: 12 (ref 5–15)
BUN: 54 mg/dL — ABNORMAL HIGH (ref 8–23)
CO2: 23 mmol/L (ref 22–32)
Calcium: 10.4 mg/dL — ABNORMAL HIGH (ref 8.9–10.3)
Chloride: 102 mmol/L (ref 98–111)
Creatinine, Ser: 4.5 mg/dL — ABNORMAL HIGH (ref 0.44–1.00)
GFR, Estimated: 10 mL/min — ABNORMAL LOW (ref >60)
Glucose, Bld: 160 mg/dL — ABNORMAL HIGH (ref 70–99)
Potassium: 4.7 mmol/L (ref 3.5–5.1)
Sodium: 137 mmol/L (ref 135–145)
Total Bilirubin: 0.7 mg/dL (ref 0.0–1.2)
Total Protein: 7.6 g/dL (ref 6.5–8.1)

## 2023-08-09 LAB — URINALYSIS, ROUTINE W REFLEX MICROSCOPIC
Bilirubin Urine: NEGATIVE
Glucose, UA: NEGATIVE mg/dL
Hgb urine dipstick: NEGATIVE
Ketones, ur: NEGATIVE mg/dL
Leukocytes,Ua: NEGATIVE
Nitrite: NEGATIVE
Protein, ur: 30 mg/dL — AB
Specific Gravity, Urine: 1.008 (ref 1.005–1.030)
pH: 6 (ref 5.0–8.0)

## 2023-08-09 MED ORDER — HALOPERIDOL 1 MG PO TABS: 1.0000 mg | ORAL_TABLET | Freq: Four times a day (QID) | ORAL | 0 refills | Status: DC | PRN

## 2023-08-09 MED ORDER — SODIUM CHLORIDE 0.9 % IV BOLUS
500.0000 mL | Freq: Once | INTRAVENOUS | Status: AC
  Administered 2023-08-09: 500 mL via INTRAVENOUS

## 2023-08-09 MED ORDER — HALOPERIDOL LACTATE 5 MG/ML IJ SOLN
1.0000 mg | Freq: Once | INTRAMUSCULAR | Status: AC
  Administered 2023-08-09: 1 mg via INTRAVENOUS
  Filled 2023-08-09: qty 1

## 2023-08-09 NOTE — ED Provider Notes (Signed)
[place] [facility]MENT AT [facility] Provider Note   CSN: [id number] Arrival date & time: 08/09/23  1123     History  Chief Complaint  Patient presents with   Altered Mental Status    Erica Crosby is a 78 y.o. female.  Patient BIB daughter for evaluation of acute onset confusion and altered mental status. Per daughter, the patient has mild dementia but in the last week has significantly declined to include paranoia, odd behavior such as using a flashlight "to signal them", stating there are people crawling around underneath her trailer. No illness or fever. History of ESRD pre-dialysis.   The history is provided by the patient and a relative. No language interpreter was used.  Altered Mental Status      Home Medications Prior to Admission medications   Medication Sig Start Date End Date Taking? Authorizing Provider  haloperidol (HALDOL) 1 MG tablet Take 1 tablet (1 mg total) by mouth every 6 (six) hours as needed for agitation. 08/09/23  Yes Schuyler Olden, Melvenia Beam, PA-C  amLODipine (NORVASC) 2.5 MG tablet Take 1 tablet (2.5 mg total) by mouth daily. 01/16/23   Raliegh Ip, DO  aspirin EC 81 MG tablet Take 81 mg by mouth daily.    [provider]  atorvastatin (LIPITOR) 10 MG tablet Take 1 tablet (10 mg total) by mouth daily. 01/16/23   Raliegh Ip, DO  carvedilol (COREG) 6.25 MG tablet Take 6.25 mg by mouth 2 (two) times daily. 06/25/23   [provider]  Cholecalciferol 125 MCG (5000 UT) TABS Take 5,000 Units by mouth daily. 02/12/22   [provider]  clobetasol (TEMOVATE) 0.05 % external solution Apply 1 Application topically daily as needed (scalp irriration). Patient not taking: Reported on 07/02/2023 02/13/22   Raliegh Ip, DO  Continuous Glucose Sensor ([name] 2 SENSOR) MISC USE TO TEST BLOOD SUGAR CONTINUOUSLY. Dx: E11.65. ADVANCED DIABETES SUPPLY VIA [place] 01/14/23   Delynn Flavin M, DO  folic acid  (FOLVITE) 1 MG tablet Take 1 tablet (1 mg total) by mouth daily. 01/16/23   Raliegh Ip, DO  insulin glargine (LANTUS SOLOSTAR) 100 UNIT/ML Solostar Pen Inject 5-10 Units into the skin at bedtime. 12/31/19   Raliegh Ip, DO  Omega-3 Fatty Acids (FISH OIL) 1000 MG CAPS Take 1,000 mg by mouth daily.    [provider]  pantoprazole (PROTONIX) 40 MG tablet TAKE ONE TABLET DAILY 08/08/23   Delynn Flavin M, DO      Allergies    Iodinated contrast media, Latex, Lisinopril, and Shellfish allergy    Review of Systems   Review of Systems  Physical Exam Updated Vital Signs BP (!) 178/88   Pulse 79   Temp 98.3 F (36.8 C) (Oral)   Resp (!) 21   SpO2 98%  Physical Exam Vitals and nursing note reviewed.  Constitutional:      General: She is not in acute distress.    Appearance: Normal appearance.  HENT:     Head: Normocephalic.     Mouth/Throat:     Mouth: Mucous membranes are dry.  Musculoskeletal:     Cervical back: Normal range of motion and neck supple.  Neurological:     Mental Status: She is alert.  Psychiatric:        Mood and Affect: Affect is angry.        Speech: Speech normal.        Behavior: Behavior is agitated.  Thought Content: Thought content is delusional.     Comments: Patient rambling in angry tone. Discusses "all the people dying because they are black and white mix".     ED Results / Procedures / Treatments   Labs (all labs ordered are listed, but only abnormal results are displayed) Labs Reviewed  COMPREHENSIVE METABOLIC PANEL - Abnormal; Notable for the following components:      Result Value   Glucose, Bld 160 (*)    BUN 54 (*)    Creatinine, Ser 4.50 (*)    Calcium 10.4 (*)    Albumin 2.9 (*)    GFR, Estimated 10 (*)    All other components within normal limits  CBC - Abnormal; Notable for the following components:   RBC 3.79 (*)    Hemoglobin 10.4 (*)    HCT 33.4 (*)    RDW 16.6 (*)    All other components  within normal limits  URINALYSIS, ROUTINE W REFLEX MICROSCOPIC - Abnormal; Notable for the following components:   Color, Urine STRAW (*)    Protein, ur 30 (*)    Bacteria, UA RARE (*)    All other components within normal limits   Results for orders placed or performed during the hospital encounter of 08/09/23  Comprehensive metabolic panel   Collection Time: 08/09/23 11:39 AM  Result Value Ref Range   Sodium 137 135 - 145 mmol/L   Potassium 4.7 3.5 - 5.1 mmol/L   Chloride 102 98 - 111 mmol/L   CO2 23 22 - 32 mmol/L   Glucose, Bld 160 (H) 70 - 99 mg/dL   BUN 54 (H) 8 - 23 mg/dL   Creatinine, Ser 1.61 (H) 0.44 - 1.00 mg/dL   Calcium 09.6 (H) 8.9 - 10.3 mg/dL   Total Protein 7.6 6.5 - 8.1 g/dL   Albumin 2.9 (L) 3.5 - 5.0 g/dL   AST 18 15 - 41 U/L   ALT 14 0 - 44 U/L   Alkaline Phosphatase 54 38 - 126 U/L   Total Bilirubin 0.7 0.0 - 1.2 mg/dL   GFR, Estimated 10 (L) >60 mL/min   Anion gap 12 5 - 15  CBC   Collection Time: 08/09/23 11:39 AM  Result Value Ref Range   WBC 5.6 4.0 - 10.5 K/uL   RBC 3.79 (L) 3.87 - 5.11 MIL/uL   Hemoglobin 10.4 (L) 12.0 - 15.0 g/dL   HCT 04.5 (L) 40.9 - 81.1 %   MCV 88.1 80.0 - 100.0 fL   MCH 27.4 26.0 - 34.0 pg   MCHC 31.1 30.0 - 36.0 g/dL   RDW 91.4 (H) 78.2 - 95.6 %   Platelets 253 150 - 400 K/uL   nRBC 0.0 0.0 - 0.2 %  Urinalysis, Routine w reflex microscopic -Urine, Clean Catch   Collection Time: 08/09/23  2:55 PM  Result Value Ref Range   Color, Urine STRAW (A) YELLOW   APPearance CLEAR CLEAR   Specific Gravity, Urine 1.008 1.005 - 1.030   pH 6.0 5.0 - 8.0   Glucose, UA NEGATIVE NEGATIVE mg/dL   Hgb urine dipstick NEGATIVE NEGATIVE   Bilirubin Urine NEGATIVE NEGATIVE   Ketones, ur NEGATIVE NEGATIVE mg/dL   Protein, ur 30 (A) NEGATIVE mg/dL   Nitrite NEGATIVE NEGATIVE   Leukocytes,Ua NEGATIVE NEGATIVE   RBC / HPF 0-5 0 - 5 RBC/hpf   WBC, UA 0-5 0 - 5 WBC/hpf   Bacteria, UA RARE (A) NONE SEEN   Squamous Epithelial / HPF 6-10 0 -  5 /HPF   Mucus PRESENT     EKG None  Radiology CT Head Wo Contrast Result Date: 08/09/2023 CLINICAL DATA:  Mental status change. EXAM: CT HEAD WITHOUT CONTRAST TECHNIQUE: Contiguous axial images were obtained from the base of the skull through the vertex without intravenous contrast. RADIATION DOSE REDUCTION: This exam was performed according to the departmental dose-optimization program which includes automated exposure control, adjustment of the mA and/or kV according to patient size and/or use of iterative reconstruction technique. COMPARISON:  CT head without contrast 05/05/2021 FINDINGS: Brain: No acute infarct, hemorrhage, or mass lesion is present. Progressive confluent periventricular white matter hypoattenuation is present bilaterally. Deep brain nuclei are within normal limits. Slight progression of atrophy is noted. No significant extraaxial fluid collection is present. The brainstem and cerebellum are within normal limits. Midline structures are within normal limits. Vascular: Dense atherosclerotic calcifications present within the cavernous internal carotid arteries bilaterally. More subtle calcifications are present at the dural margin of both vertebral arteries. No hyperdense vessel is present. Skull: Calvarium is intact. No focal lytic or blastic lesions are present. No significant extracranial soft tissue lesion is present. Sinuses/Orbits: The paranasal sinuses and mastoid air cells are clear. The globes and orbits are within normal limits. IMPRESSION: 1. No acute intracranial abnormality or significant interval change. 2. Progressive confluent periventricular white matter hypoattenuation bilaterally. This likely reflects the sequela of chronic microvascular ischemia. 3. Slight progression of atrophy. Electronically Signed   By: Marin Roberts M.D.   On: 08/09/2023 13:25    Procedures Procedures    Medications Ordered in ED Medications  sodium chloride 0.9 % bolus 500 mL (0  mLs Intravenous Stopped 08/09/23 1635)  haloperidol lactate (HALDOL) injection 1 mg (1 mg Intravenous Given 08/09/23 1530)    ED Course/ Medical Decision Making/ A&P Clinical Course as of 08/09/23 1835  Fri Aug 09, 2023  1501 BIB daughter for evaluation of altered mental status from baseline that is acute change in the last week.  [SU]    Clinical Course User Index [SU] Elpidio Anis, PA-C                                 Medical Decision Making This patient presents to the ED for concern of AMS, this involves an extensive number of treatment options, and is a complaint that carries with it a high risk of complications and morbidity.  The differential diagnosis includes Infection/sepsis, acute encephalopathy, psychiatric decline/dementia   Co morbidities that complicate the patient evaluation  ESRD pre-dialysis, HTN, T2DM, rheumatoid arthritis, HLD, GERD, PUD, CAD, neuropathy   Lab Tests:  I Ordered, and personally interpreted labs.  The pertinent results include:  WBC 5.6, hgb 10.4; CR 4.50 (baseline 4.02), BUN 54, K+ 4.7.   Imaging Studies ordered:  I ordered imaging studies including head CT Per radiologist interpretation - no acute process  Medicines ordered and prescription drug management:  I ordered medication including Haldol  for agitation Reevaluation of the patient after these medicines showed that the patient improved I have reviewed the patients home medicines and have made adjustments as needed   Test Considered:  MRI given new onset of altered mental, paranoia - pending neurology consult   Consultations Obtained:  I requested consultation with the Dr. Otelia Limes, neurology,  and discussed lab and imaging findings as well as pertinent plan - they recommend: approaching further evaluation by psychiatry.    Problem List / ED Course:  BIB daughter for evaluation of acute mental status changes as detailed in HPI. She continues to be agitated in the ED but has  cooperated with testing. Haldol for agitation provided temporary calming.  Patient will need psychiatry input for further diagnosis and treatment.  Daughter would rather follow up in the outpatient setting and go home tonight.  Will prescribe Haldol for home.    Reevaluation:  After the interventions noted above, I reevaluated the patient and found that they have :stayed the same   Social Determinants of Health:  Lives with sons, who provide limited care per daughter   Disposition:  After consideration of the diagnostic results and the patients response to treatment, I feel that the patient would benefit from discharge home.     Amount and/or Complexity of Data Reviewed Labs: ordered. Radiology: ordered.  Risk Prescription drug management.           Final Clinical Impression(s) / ED Diagnoses Final diagnoses:  Acute delirium    Rx / DC Orders ED Discharge Orders          Ordered    haloperidol (HALDOL) 1 MG tablet  Every 6 hours PRN        08/09/23 1833              Elpidio Anis, PA-C 08/09/23 1835    Terrilee Files, MD 08/09/23 [phone]

## 2023-08-09 NOTE — ED Notes (Signed)
Patient stated she did not want discharge vitals taken she just wanted to go home.

## 2023-08-09 NOTE — Discharge Instructions (Addendum)
Give Haldol up to 4 times daily for agitation. Follow up with Behavioral Health or with a psychiatry office of your choice for further outpatient management of acute changes in mental health.   Return to the ED as needed if symptoms worsen or for new concern.

## 2023-08-09 NOTE — ED Triage Notes (Signed)
Pts daughter reports pt has been paranoid that "Garnet Koyanagi is going to blow up [facility]." Daughter reports pt has been shining a flash light into the sky to trying signal to someone. Pt currently rambling in triage.

## 2023-08-22 ENCOUNTER — Other Ambulatory Visit: Payer: Self-pay | Admitting: Emergency Medicine

## 2023-08-22 ENCOUNTER — Encounter: Payer: Medicare Other | Attending: Nephrology | Admitting: *Deleted

## 2023-08-22 VITALS — BP 123/81 | HR 73 | Temp 97.9°F | Resp 16

## 2023-08-22 DIAGNOSIS — D631 Anemia in chronic kidney disease: Secondary | ICD-10-CM | POA: Insufficient documentation

## 2023-08-22 DIAGNOSIS — N183 Chronic kidney disease, stage 3 unspecified: Secondary | ICD-10-CM | POA: Diagnosis not present

## 2023-08-22 DIAGNOSIS — N1832 Chronic kidney disease, stage 3b: Secondary | ICD-10-CM | POA: Diagnosis not present

## 2023-08-22 LAB — GLUCOSE HEMOCUE WAIVED: Hemoglobin: 10.5

## 2023-08-22 MED ORDER — EPOETIN ALFA-EPBX 20000 UNIT/ML IJ SOLN
20000.0000 [IU] | Freq: Once | INTRAMUSCULAR | Status: AC
  Administered 2023-08-22: 20000 [IU] via SUBCUTANEOUS

## 2023-08-22 NOTE — Progress Notes (Signed)
 Diagnosis: Anemia in Chronic Kidney Disease  Provider:  Annie Sable MD  Procedure: Injection  Retacrit (epoetin alfa-epbx), Dose: 20000 Units, Site: subcutaneous, Number of injections: 1  Hgb. 10.5  Injection Site(s): Right upper quad. abdomen  Post Care: Observation period completed  Discharge: Condition: Good, Destination: Home . AVS Provided  Performed by:  Daleen Squibb, RN

## 2023-08-23 LAB — RENAL FUNCTION PANEL
Albumin: 3.4 g/dL — ABNORMAL LOW (ref 3.6–5.1)
BUN/Creatinine Ratio: 13 (calc) (ref 6–22)
BUN: 69 mg/dL — ABNORMAL HIGH (ref 7–25)
CO2: 24 mmol/L (ref 20–32)
Calcium: 10.3 mg/dL (ref 8.6–10.4)
Chloride: 103 mmol/L (ref 98–110)
Creat: 5.39 mg/dL — ABNORMAL HIGH (ref 0.60–1.00)
Glucose, Bld: 186 mg/dL — ABNORMAL HIGH (ref 65–99)
Phosphorus: 5.8 mg/dL — ABNORMAL HIGH (ref 2.1–4.3)
Potassium: 4.4 mmol/L (ref 3.5–5.3)
Sodium: 137 mmol/L (ref 135–146)

## 2023-08-23 LAB — IRON,TIBC AND FERRITIN PANEL
%SAT: 27 % (ref 16–45)
Ferritin: 204 ng/mL (ref 16–288)
Iron: 57 ug/dL (ref 45–160)
TIBC: 211 ug/dL — ABNORMAL LOW (ref 250–450)

## 2023-08-23 LAB — PTH, INTACT AND CALCIUM
Calcium: 10.3 mg/dL (ref 8.6–10.4)
PTH: 9 pg/mL — ABNORMAL LOW (ref 16–77)

## 2023-08-26 DIAGNOSIS — I129 Hypertensive chronic kidney disease with stage 1 through stage 4 chronic kidney disease, or unspecified chronic kidney disease: Secondary | ICD-10-CM | POA: Diagnosis not present

## 2023-08-26 DIAGNOSIS — M069 Rheumatoid arthritis, unspecified: Secondary | ICD-10-CM | POA: Diagnosis not present

## 2023-08-26 DIAGNOSIS — R634 Abnormal weight loss: Secondary | ICD-10-CM | POA: Diagnosis not present

## 2023-08-26 DIAGNOSIS — N2889 Other specified disorders of kidney and ureter: Secondary | ICD-10-CM | POA: Diagnosis not present

## 2023-08-26 DIAGNOSIS — N179 Acute kidney failure, unspecified: Secondary | ICD-10-CM | POA: Diagnosis not present

## 2023-08-26 DIAGNOSIS — E1122 Type 2 diabetes mellitus with diabetic chronic kidney disease: Secondary | ICD-10-CM | POA: Diagnosis not present

## 2023-08-26 DIAGNOSIS — Z7189 Other specified counseling: Secondary | ICD-10-CM | POA: Diagnosis not present

## 2023-08-26 DIAGNOSIS — D631 Anemia in chronic kidney disease: Secondary | ICD-10-CM | POA: Diagnosis not present

## 2023-08-26 DIAGNOSIS — N183 Chronic kidney disease, stage 3 unspecified: Secondary | ICD-10-CM | POA: Diagnosis not present

## 2023-08-28 ENCOUNTER — Telehealth: Payer: Self-pay

## 2023-08-28 NOTE — Telephone Encounter (Signed)
 Attempted to call for surgery scheduling. LVM

## 2023-09-02 ENCOUNTER — Encounter (HOSPITAL_COMMUNITY): Payer: Self-pay | Admitting: Vascular Surgery

## 2023-09-02 ENCOUNTER — Other Ambulatory Visit: Payer: Self-pay

## 2023-09-02 DIAGNOSIS — N186 End stage renal disease: Secondary | ICD-10-CM

## 2023-09-02 NOTE — Progress Notes (Signed)
 SDW call  Patient's daughter, Cathlyn Parsons was given pre-op instructions over the phone. She verbalized understanding of instructions provided.     PCP - Dr. Doylene Canard Cardiologist - Dr. Rollene Rotunda Pulmonary:    PPM/ICD - denies Device Orders - na Rep Notified - na   Chest x-ray - na EKG -  05/08/2023 Stress Test - ECHO - 07/09/2022 Cardiac Cath -   Sleep Study/sleep apnea/CPAP: denies  Non-diabetic  Blood Thinner Instructions: denies  Aspirin Instructions:continue   ERAS Protcol - NPO   Anesthesia review: Yes.  TIA, CAD, HTN, DM, ESRD   Patient denies shortness of breath, fever, cough and chest pain over the phone call  Your procedure is scheduled on Wednesday September 04, 2023  Report to [facility] Main Entrance "A" at  1140  A.M., then check in with the Admitting office.  Call this number if you have problems the morning of surgery:  [phone]   If you have any questions prior to your surgery date call [phone]: Open Monday-Friday 8am-4pm If you experience any cold or flu symptoms such as cough, fever, chills, shortness of breath, etc. between now and your scheduled surgery, please notify us at the above number    Remember:  Do not eat or drink  after midnight the night before your surgery  Take these medicines the morning of surgery with A SIP OF WATER:  Amlodipine, asa, atorvastatin, carvedilol, protonix  As needed: haldol  As of today, STOP taking any Aleve, Naproxen, Ibuprofen, Motrin, Advil, Goody's, BC's, all herbal medications, fish oil, and all vitamins.

## 2023-09-03 ENCOUNTER — Encounter (HOSPITAL_COMMUNITY): Payer: Self-pay | Admitting: Vascular Surgery

## 2023-09-03 NOTE — Progress Notes (Signed)
 Case: [id number] Date/Time: 09/04/23 1356   Procedures:      ARTERIOVENOUS (AV) FISTULA CREATION (Left)     INSERTION OF DIALYSIS CATHETER   Anesthesia type: Monitor Anesthesia Care   Diagnosis: End stage renal disease (HCC) [N18.6]   Pre-op diagnosis: ESRD   [facility] ROOM 11 / [facility]   Surgeons: Leonie Douglas, MD       DISCUSSION: Erica Crosby is a 78 yo female who is a SDW prior to surgery above. PMH of former smoking, HTN, CAD, severe mitral regurgitation, ESRD not yet on dialysis, GERD, mild dementia, insulin-dependent diabetes, RA, anemia, arthritis  Patient follows with cardiology for nonobstructive CAD by cath in 2012 and severe mitral regurgitation.  Last seen by Dr. Antoine Poche on 05/08/2023.  She had no cardiac complaints at that time.  Last echo in January 2024 showed normal EF with moderate to severe mitral regurgitation and mild to moderate mitral stenosis.  Per Dr. Antoine Poche: "She would be high risk for open surgical repair.  She might be a candidate for percutaneous repair but she does not want to consider this at this point.  She is not having any acute cardiovascular symptoms and for now no change in therapy is indicated."  Last seen by PCP on 05/20/2023.  All issues stable at that time.  She receives monthly iron infusions for her anemia.  Insulin dose was decreased due to well-controlled diabetes.  Patient had ED visit for increased paranoia and delirium on 08/09/2023.  Labs were checked and were at baseline.  There is no evidence of infection.  She was given Haldol and improved and prescribed Haldol to take as needed.  VS: Ht 5\' 6"  (1.676 m)   Wt 46.3 kg   BMI 16.46 kg/m   PROVIDERS: Raliegh Ip, DO Cardiologist - Dr. Rollene Rotunda Nephrology - Terrial Rhodes, MD  LABS: Labs reviewed: Acceptable for surgery. (all labs ordered are listed, but only abnormal results are displayed)  Labs Reviewed - No data to display   IMAGES:   EKG  05/08/2023:  Normal sinus rhythm, rate 68 Right atrial enlargement Non-specific intra-ventricular conduction block Minimal voltage criteria for LVH, may be normal variant ( Cornell product ) Cannot rule out Anteroseptal infarct , age undetermined  CV:  Echo 07/09/2022: IMPRESSIONS    1. Left ventricular ejection fraction, by estimation, is 60 to 65%. The left ventricle has normal function. The left ventricle has no regional wall motion abnormalities. There is moderate left ventricular hypertrophy. Left ventricular diastolic parameters were normal. Elevated left atrial pressure.  2. Right ventricular systolic function is normal. The right ventricular size is normal. There is normal pulmonary artery systolic pressure.  3. Left atrial size was severely dilated.  4. MR vena contracta is 0.6 cm. MV to AV VTI ratio is 1.8 suggesting severe MR. . The mitral valve is abnormal. Moderate to severe mitral valve regurgitation. Mild to moderate mitral stenosis. Moderate mitral annular calcification.  5. The aortic valve is tricuspid. Aortic valve regurgitation is not visualized. No aortic stenosis is present.  6. The inferior vena cava is normal in size with greater than 50% respiratory variability, suggesting right atrial pressure of 3 mmHg.   Past Medical History:  Diagnosis Date   Anemia    Arthritis    Cataract    Coronary artery disease    Mild plaque 2012   Diabetes mellitus    Essential hypertension 02/10/2012   GERD (gastroesophageal reflux disease)    Hyperlipidemia  Hypertension    Neuropathy    Peptic ulcer    Rheumatoid arthritis (HCC)    Syncope 02/10/2012    Past Surgical History:  Procedure Laterality Date   ABSCESS DRAINAGE     CARPAL TUNNEL RELEASE Left    CHOLECYSTECTOMY     KNEE SURGERY Right    NECK SURGERY     x 2   SHOULDER SURGERY Left    TEE WITHOUT CARDIOVERSION N/A 03/12/2022   Procedure: TRANSESOPHAGEAL ECHOCARDIOGRAM (TEE);  Surgeon:  Thurmon Fair, MD;  Location[facility] ENDOSCOPY;  Service: Cardiovascular;  Laterality: N/A;    MEDICATIONS: No current facility-administered medications for this encounter.    amLODipine (NORVASC) 2.5 MG tablet   aspirin EC 81 MG tablet   atorvastatin (LIPITOR) 10 MG tablet   carvedilol (COREG) 6.25 MG tablet   Cholecalciferol 125 MCG (5000 UT) TABS   clobetasol (TEMOVATE) 0.05 % external solution   Continuous Glucose Sensor ([name] 2 SENSOR) MISC   folic acid (FOLVITE) 1 MG tablet   haloperidol (HALDOL) 1 MG tablet   insulin glargine (LANTUS SOLOSTAR) 100 UNIT/ML Solostar Pen   Omega-3 Fatty Acids (FISH OIL) 1000 MG CAPS   pantoprazole (PROTONIX) 40 MG tablet   Erica Glassing, PA-C [facility] Surgical Short Stay/Anesthesiology [facility] Phone [phone] 09/03/2023 9:37 AM

## 2023-09-03 NOTE — Anesthesia Preprocedure Evaluation (Signed)
 Anesthesia Evaluation  Patient identified by MRN, date of birth, ID band Patient awake    Reviewed: Allergy & Precautions, NPO status , Patient's Chart, lab work & pertinent test results, reviewed documented beta blocker date and time   Airway Mallampati: III  TM Distance: >3 FB Neck ROM: Full    Dental  (+) Edentulous Upper, Edentulous Lower, Dental Advisory Given   Pulmonary former smoker   Pulmonary exam normal breath sounds clear to auscultation       Cardiovascular hypertension, Pt. on home beta blockers and Pt. on medications + CAD  Normal cardiovascular exam+ Valvular Problems/Murmurs (Mild-mod MS, severe MR) MR  Rhythm:Regular Rate:Normal + Systolic murmurs Echo 06/2022 1. Left ventricular ejection fraction, by estimation, is 60 to 65%. The left ventricle has normal function. The left ventricle has no regional wall motion abnormalities. There is moderate left ventricular hypertrophy. Left ventricular diastolic parameters were normal. Elevated left atrial pressure.   2. Right ventricular systolic function is normal. The right ventricular size is normal. There is normal pulmonary artery systolic pressure.   3. Left atrial size was severely dilated.   4. MR vena contracta is 0.6 cm. MV to AV VTI ratio is 1.8 suggesting severe MR. The mitral valve is abnormal. Moderate to severe mitral valve regurgitation. Mild to moderate mitral stenosis. Moderate mitral annular calcification.   5. The aortic valve is tricuspid. Aortic valve regurgitation is not visualized. No aortic stenosis is present.   6. The inferior vena cava is normal in size with greater than 50% respiratory variability, suggesting right atrial pressure of 3 mmHg.     TEE 02/2022  1. Left ventricular ejection fraction, by estimation, is 60 to 65%. The left ventricle has normal function.   2. Right ventricular systolic function is normal. The right ventricular size is normal.  There is normal pulmonary artery systolic pressure. The estimated right ventricular systolic pressure is 29.8 mmHg.   3. Left atrial size was moderately dilated. No left atrial/left atrial appendage thrombus was detected.   4. There is mild mitral valve chordal shortening and leaflet tethering, with very mild mitral stenosis. By the PISA method, the effective regurgitant orifice area is 0.3 cm sq, regurgitant volume 65 ml, regurgitant fraction 50%. Both right and left pulmonary veins show normal systolic antegrade flow (equalized systolic and diastolic components). The mitral valve is rheumatic. Moderate to severe mitral valve regurgitation. The mean mitral valve gradient is 4.0 mmHg with average heart rate of 60 bpm.   5. The aortic valve is tricuspid. Aortic valve regurgitation is not visualized. No aortic stenosis is present.   6. There is mild (Grade II) protruding plaque involving the aortic arch.      Neuro/Psych TIA negative psych ROS   GI/Hepatic Neg liver ROS, PUD,GERD  Medicated,,  Endo/Other  diabetes, Type 2, Insulin Dependent    Renal/GU Renal disease     Musculoskeletal  (+) Arthritis , Rheumatoid disorders,    Abdominal   Peds  Hematology  (+) Blood dyscrasia, anemia   Anesthesia Other Findings   Reproductive/Obstetrics                             Anesthesia Physical Anesthesia Plan  ASA: 4  Anesthesia Plan: General   Post-op Pain Management: Tylenol PO (pre-op)*   Induction: Intravenous  PONV Risk Score and Plan: 3 and Treatment may vary due to age or medical condition, Ondansetron and Dexamethasone  Airway Management Planned:  Oral ETT  Additional Equipment: None  Intra-op Plan:   Post-operative Plan: Extubation in OR  Informed Consent: I have reviewed the patients History and Physical, chart, labs and discussed the procedure including the risks, benefits and alternatives for the proposed anesthesia with the patient or  authorized representative who has indicated his/her understanding and acceptance.     Dental advisory given and Consent reviewed with POA  Plan Discussed with: CRNA  Anesthesia Plan Comments: (See PAT note from 3/18 by Sherlie Ban PA-C )        Anesthesia Quick Evaluation

## 2023-09-03 NOTE — Progress Notes (Signed)
 Patient was called to be informed that the surgery time for tomorrow was changed to 10:24 o'clock. Patient wasn't available and this writer talked with the patient's daughter. Daughter was informed that the patient must be at the hospital tomorrow morning at 08:00 o'clock. Daughter verbalized understanding.

## 2023-09-04 ENCOUNTER — Ambulatory Visit (HOSPITAL_COMMUNITY)

## 2023-09-04 ENCOUNTER — Other Ambulatory Visit: Payer: Self-pay

## 2023-09-04 ENCOUNTER — Encounter: Payer: Self-pay | Admitting: Nephrology

## 2023-09-04 ENCOUNTER — Ambulatory Visit (HOSPITAL_COMMUNITY)
Admission: RE | Admit: 2023-09-04 | Discharge: 2023-09-04 | Disposition: A | Discharge status: Home / Self Care | Attending: Surgery | Admitting: Surgery

## 2023-09-04 ENCOUNTER — Ambulatory Visit (HOSPITAL_COMMUNITY): Admitting: Medical

## 2023-09-04 ENCOUNTER — Ambulatory Visit (HOSPITAL_BASED_OUTPATIENT_CLINIC_OR_DEPARTMENT_OTHER): Admitting: Medical

## 2023-09-04 ENCOUNTER — Other Ambulatory Visit (HOSPITAL_COMMUNITY): Payer: Self-pay

## 2023-09-04 ENCOUNTER — Encounter (HOSPITAL_COMMUNITY)
Admission: RE | Disposition: A | Discharge status: Home / Self Care | Payer: Self-pay | Source: Home / Self Care | Attending: Surgery

## 2023-09-04 ENCOUNTER — Encounter (HOSPITAL_COMMUNITY): Payer: Self-pay | Admitting: Surgery

## 2023-09-04 DIAGNOSIS — Z87891 Personal history of nicotine dependence: Secondary | ICD-10-CM | POA: Insufficient documentation

## 2023-09-04 DIAGNOSIS — E1122 Type 2 diabetes mellitus with diabetic chronic kidney disease: Secondary | ICD-10-CM | POA: Insufficient documentation

## 2023-09-04 DIAGNOSIS — Z7982 Long term (current) use of aspirin: Secondary | ICD-10-CM | POA: Insufficient documentation

## 2023-09-04 DIAGNOSIS — N185 Chronic kidney disease, stage 5: Secondary | ICD-10-CM | POA: Diagnosis not present

## 2023-09-04 DIAGNOSIS — I052 Rheumatic mitral stenosis with insufficiency: Secondary | ICD-10-CM | POA: Diagnosis not present

## 2023-09-04 DIAGNOSIS — Z79899 Other long term (current) drug therapy: Secondary | ICD-10-CM | POA: Insufficient documentation

## 2023-09-04 DIAGNOSIS — Z794 Long term (current) use of insulin: Secondary | ICD-10-CM | POA: Insufficient documentation

## 2023-09-04 DIAGNOSIS — N186 End stage renal disease: Secondary | ICD-10-CM | POA: Diagnosis not present

## 2023-09-04 DIAGNOSIS — K279 Peptic ulcer, site unspecified, unspecified as acute or chronic, without hemorrhage or perforation: Secondary | ICD-10-CM | POA: Insufficient documentation

## 2023-09-04 DIAGNOSIS — D631 Anemia in chronic kidney disease: Secondary | ICD-10-CM | POA: Diagnosis not present

## 2023-09-04 DIAGNOSIS — K219 Gastro-esophageal reflux disease without esophagitis: Secondary | ICD-10-CM | POA: Insufficient documentation

## 2023-09-04 DIAGNOSIS — I2510 Atherosclerotic heart disease of native coronary artery without angina pectoris: Secondary | ICD-10-CM | POA: Diagnosis not present

## 2023-09-04 DIAGNOSIS — Z8673 Personal history of transient ischemic attack (TIA), and cerebral infarction without residual deficits: Secondary | ICD-10-CM | POA: Diagnosis not present

## 2023-09-04 DIAGNOSIS — I120 Hypertensive chronic kidney disease with stage 5 chronic kidney disease or end stage renal disease: Secondary | ICD-10-CM | POA: Insufficient documentation

## 2023-09-04 DIAGNOSIS — Z833 Family history of diabetes mellitus: Secondary | ICD-10-CM | POA: Diagnosis not present

## 2023-09-04 DIAGNOSIS — M069 Rheumatoid arthritis, unspecified: Secondary | ICD-10-CM | POA: Insufficient documentation

## 2023-09-04 DIAGNOSIS — Z452 Encounter for adjustment and management of vascular access device: Secondary | ICD-10-CM | POA: Diagnosis not present

## 2023-09-04 HISTORY — PX: INSERTION OF DIALYSIS CATHETER: SHX1324

## 2023-09-04 HISTORY — PX: AV FISTULA PLACEMENT: SHX1204

## 2023-09-04 LAB — GLUCOSE, CAPILLARY
Glucose-Capillary: 121 mg/dL — ABNORMAL HIGH (ref 70–99)
Glucose-Capillary: 118 mg/dL — ABNORMAL HIGH (ref 70–99)

## 2023-09-04 LAB — POCT I-STAT, CHEM 8
BUN: 63 mg/dL — ABNORMAL HIGH (ref 8–23)
Calcium, Ion: 1.4 mmol/L (ref 1.15–1.40)
Chloride: 110 mmol/L (ref 98–111)
Creatinine, Ser: 5.4 mg/dL — ABNORMAL HIGH (ref 0.44–1.00)
Glucose, Bld: 135 mg/dL — ABNORMAL HIGH (ref 70–99)
HCT: 35 % — ABNORMAL LOW (ref 36.0–46.0)
Hemoglobin: 11.9 g/dL — ABNORMAL LOW (ref 12.0–15.0)
Potassium: 4.5 mmol/L (ref 3.5–5.1)
Sodium: 137 mmol/L (ref 135–145)
TCO2: 21 mmol/L — ABNORMAL LOW (ref 22–32)

## 2023-09-04 SURGERY — ARTERIOVENOUS (AV) FISTULA CREATION
Anesthesia: General | Site: Neck | Laterality: Right

## 2023-09-04 MED ORDER — CHLORHEXIDINE GLUCONATE 0.12 % MT SOLN
15.0000 mL | Freq: Once | OROMUCOSAL | Status: AC
  Administered 2023-09-04: 15 mL via OROMUCOSAL
  Filled 2023-09-04: qty 15

## 2023-09-04 MED ORDER — OXYCODONE-ACETAMINOPHEN 5-325 MG PO TABS
1.0000 | ORAL_TABLET | Freq: Four times a day (QID) | ORAL | 0 refills | Status: DC | PRN
  Filled 2023-09-04: qty 8, 2d supply, fill #0

## 2023-09-04 MED ORDER — CARVEDILOL 3.125 MG PO TABS
ORAL_TABLET | ORAL | Status: AC
  Administered 2023-09-04: 6.25 mg via ORAL
  Filled 2023-09-04: qty 2

## 2023-09-04 MED ORDER — CHLORHEXIDINE GLUCONATE 4 % EX SOLN: 60.0000 mL | Freq: Once | CUTANEOUS | Status: DC

## 2023-09-04 MED ORDER — ACETAMINOPHEN 500 MG PO TABS
1000.0000 mg | ORAL_TABLET | Freq: Once | ORAL | Status: AC
  Administered 2023-09-04: 1000 mg via ORAL
  Filled 2023-09-04: qty 2

## 2023-09-04 MED ORDER — DROPERIDOL 2.5 MG/ML IJ SOLN: 0.6250 mg | Freq: Once | INTRAMUSCULAR | Status: DC | PRN

## 2023-09-04 MED ORDER — PHENYLEPHRINE HCL-NACL 20-0.9 MG/250ML-% IV SOLN
INTRAVENOUS | Status: DC | PRN
  Administered 2023-09-04: 40 ug/min via INTRAVENOUS

## 2023-09-04 MED ORDER — LIDOCAINE 2% (20 MG/ML) 5 ML SYRINGE
INTRAMUSCULAR | Status: DC | PRN
  Administered 2023-09-04: 60 mg via INTRAVENOUS

## 2023-09-04 MED ORDER — OXYCODONE HCL 5 MG PO TABS: 5.0000 mg | ORAL_TABLET | Freq: Once | ORAL | Status: DC | PRN

## 2023-09-04 MED ORDER — FENTANYL CITRATE (PF) 250 MCG/5ML IJ SOLN
INTRAMUSCULAR | Status: AC
  Filled 2023-09-04: qty 5

## 2023-09-04 MED ORDER — LIDOCAINE HCL (PF) 1 % IJ SOLN
INTRAMUSCULAR | Status: AC
  Filled 2023-09-04: qty 30

## 2023-09-04 MED ORDER — ORAL CARE MOUTH RINSE: 15.0000 mL | Freq: Once | OROMUCOSAL | Status: AC

## 2023-09-04 MED ORDER — SUGAMMADEX SODIUM 200 MG/2ML IV SOLN
INTRAVENOUS | Status: AC
  Filled 2023-09-04: qty 2

## 2023-09-04 MED ORDER — ROCURONIUM BROMIDE 10 MG/ML (PF) SYRINGE
PREFILLED_SYRINGE | INTRAVENOUS | Status: DC | PRN
  Administered 2023-09-04: 40 mg via INTRAVENOUS

## 2023-09-04 MED ORDER — PROPOFOL 10 MG/ML IV BOLUS
INTRAVENOUS | Status: DC | PRN
  Administered 2023-09-04: 70 mg via INTRAVENOUS

## 2023-09-04 MED ORDER — HEPARIN SODIUM (PORCINE) 1000 UNIT/ML IJ SOLN
INTRAMUSCULAR | Status: DC | PRN
  Administered 2023-09-04: 3.8 [IU]

## 2023-09-04 MED ORDER — 0.9 % SODIUM CHLORIDE (POUR BTL) OPTIME
TOPICAL | Status: DC | PRN
  Administered 2023-09-04: 1000 mL

## 2023-09-04 MED ORDER — HEPARIN 6000 UNIT IRRIGATION SOLUTION
Status: DC | PRN
  Administered 2023-09-04: 1

## 2023-09-04 MED ORDER — CEFAZOLIN SODIUM-DEXTROSE 2-4 GM/100ML-% IV SOLN
2.0000 g | INTRAVENOUS | Status: AC
  Administered 2023-09-04: 2 g via INTRAVENOUS
  Filled 2023-09-04: qty 100

## 2023-09-04 MED ORDER — HEMOSTATIC AGENTS (NO CHARGE) OPTIME
TOPICAL | Status: DC | PRN
  Administered 2023-09-04: 1 via TOPICAL

## 2023-09-04 MED ORDER — HEPARIN SODIUM (PORCINE) 1000 UNIT/ML IJ SOLN
INTRAMUSCULAR | Status: AC
  Filled 2023-09-04: qty 10

## 2023-09-04 MED ORDER — CARVEDILOL 3.125 MG PO TABS: 6.2500 mg | ORAL_TABLET | Freq: Once | ORAL | Status: AC

## 2023-09-04 MED ORDER — ONDANSETRON HCL 4 MG/2ML IJ SOLN
INTRAMUSCULAR | Status: AC
  Filled 2023-09-04: qty 2

## 2023-09-04 MED ORDER — HEPARIN 6000 UNIT IRRIGATION SOLUTION
Status: AC
  Filled 2023-09-04: qty 500

## 2023-09-04 MED ORDER — PROPOFOL 10 MG/ML IV BOLUS
INTRAVENOUS | Status: AC
  Filled 2023-09-04: qty 20

## 2023-09-04 MED ORDER — FENTANYL CITRATE (PF) 250 MCG/5ML IJ SOLN
INTRAMUSCULAR | Status: DC | PRN
Start: 2023-09-04 — End: 2023-09-04
  Administered 2023-09-04: 50 ug via INTRAVENOUS

## 2023-09-04 MED ORDER — SUGAMMADEX SODIUM 200 MG/2ML IV SOLN
INTRAVENOUS | Status: DC | PRN
  Administered 2023-09-04: 200 mg via INTRAVENOUS

## 2023-09-04 MED ORDER — OXYCODONE HCL 5 MG/5ML PO SOLN: 5.0000 mg | Freq: Once | ORAL | Status: DC | PRN

## 2023-09-04 MED ORDER — SODIUM CHLORIDE 0.9 % IV SOLN: INTRAVENOUS | Status: DC | PRN

## 2023-09-04 MED ORDER — FENTANYL CITRATE (PF) 100 MCG/2ML IJ SOLN
25.0000 ug | INTRAMUSCULAR | Status: DC | PRN
Start: 2023-09-04 — End: 2023-09-04

## 2023-09-04 MED ORDER — ONDANSETRON HCL 4 MG/2ML IJ SOLN
INTRAMUSCULAR | Status: DC | PRN
  Administered 2023-09-04: 4 mg via INTRAVENOUS

## 2023-09-04 MED ORDER — PHENYLEPHRINE 80 MCG/ML (10ML) SYRINGE FOR IV PUSH (FOR BLOOD PRESSURE SUPPORT)
PREFILLED_SYRINGE | INTRAVENOUS | Status: DC | PRN
  Administered 2023-09-04 (×3): 80 ug via INTRAVENOUS
  Administered 2023-09-04: 160 ug via INTRAVENOUS

## 2023-09-04 MED ORDER — SODIUM CHLORIDE 0.9% FLUSH: 3.0000 mL | Freq: Two times a day (BID) | INTRAVENOUS | Status: DC

## 2023-09-04 SURGICAL SUPPLY — 53 items
ARMBAND PINK RESTRICT EXTREMIT (MISCELLANEOUS) ×6 IMPLANT
BAG COUNTER SPONGE SURGICOUNT (BAG) ×3 IMPLANT
BAG DECANTER FOR FLEXI CONT (MISCELLANEOUS) ×3 IMPLANT
BIOPATCH RED 1 DISK 7.0 (GAUZE/BANDAGES/DRESSINGS) ×3 IMPLANT
CANISTER SUCT 3000ML PPV (MISCELLANEOUS) ×3 IMPLANT
CATH PALINDROME-P 19CM W/VT (CATHETERS) IMPLANT
CATH PALINDROME-P 23CM W/VT (CATHETERS) IMPLANT
CATH PALINDROME-P 28CM W/VT (CATHETERS) IMPLANT
CLIP TI MEDIUM 6 (CLIP) ×3 IMPLANT
CLIP TI WIDE RED SMALL 6 (CLIP) ×3 IMPLANT
COVER DOME SNAP 22 D (MISCELLANEOUS) IMPLANT
COVER PROBE W GEL 5X96 (DRAPES) ×3 IMPLANT
COVER SURGICAL LIGHT HANDLE (MISCELLANEOUS) ×3 IMPLANT
DERMABOND ADVANCED .7 DNX12 (GAUZE/BANDAGES/DRESSINGS) ×3 IMPLANT
DRAPE C-ARM 42X72 X-RAY (DRAPES) ×3 IMPLANT
DRAPE CHEST BREAST 15X10 FENES (DRAPES) ×3 IMPLANT
ELECT REM PT RETURN 9FT ADLT (ELECTROSURGICAL) ×2 IMPLANT
ELECTRODE REM PT RTRN 9FT ADLT (ELECTROSURGICAL) ×3 IMPLANT
GAUZE 4X4 16PLY ~~LOC~~+RFID DBL (SPONGE) ×3 IMPLANT
GLOVE SURG SS PI 7.5 STRL IVOR (GLOVE) ×9 IMPLANT
GOWN STRL REUS W/ TWL LRG LVL3 (GOWN DISPOSABLE) ×6 IMPLANT
GOWN STRL REUS W/ TWL XL LVL3 (GOWN DISPOSABLE) ×3 IMPLANT
HEMOSTAT SNOW SURGICEL 2X4 (HEMOSTASIS) IMPLANT
KIT BASIN OR (CUSTOM PROCEDURE TRAY) ×3 IMPLANT
KIT PALINDROME-P 55CM (CATHETERS) IMPLANT
KIT TURNOVER KIT B (KITS) ×3 IMPLANT
LOOP VESSEL MINI RED (MISCELLANEOUS) IMPLANT
NDL 18GX1X1/2 (RX/OR ONLY) (NEEDLE) ×3 IMPLANT
NDL HYPO 25GX1X1/2 BEV (NEEDLE) ×3 IMPLANT
NEEDLE 18GX1X1/2 (RX/OR ONLY) (NEEDLE) ×2 IMPLANT
NEEDLE HYPO 25GX1X1/2 BEV (NEEDLE) ×2 IMPLANT
NS IRRIG 1000ML POUR BTL (IV SOLUTION) ×3 IMPLANT
PACK CV ACCESS (CUSTOM PROCEDURE TRAY) ×3 IMPLANT
PACK SRG BSC III STRL LF ECLPS (CUSTOM PROCEDURE TRAY) ×3 IMPLANT
PAD ARMBOARD POSITIONER FOAM (MISCELLANEOUS) ×6 IMPLANT
PENCIL BUTTON HOLSTER BLD 10FT (ELECTRODE) IMPLANT
SET MICROPUNCTURE 5F STIFF (MISCELLANEOUS) IMPLANT
SLING ARM FOAM STRAP LRG (SOFTGOODS) IMPLANT
SLING ARM FOAM STRAP MED (SOFTGOODS) IMPLANT
SOAP 2 % CHG 4 OZ (WOUND CARE) ×3 IMPLANT
SURGIFLO W/THROMBIN 8M KIT (HEMOSTASIS) IMPLANT
SUT ETHILON 3 0 PS 1 (SUTURE) ×3 IMPLANT
SUT PROLENE 6 0 CC (SUTURE) ×3 IMPLANT
SUT VIC AB 3-0 SH 27X BRD (SUTURE) ×3 IMPLANT
SUT VICRYL 4-0 PS2 18IN ABS (SUTURE) ×3 IMPLANT
SYR 10ML LL (SYRINGE) ×3 IMPLANT
SYR 20ML LL LF (SYRINGE) ×6 IMPLANT
SYR 5ML LL (SYRINGE) ×3 IMPLANT
SYR CONTROL 10ML LL (SYRINGE) ×3 IMPLANT
TOWEL GREEN STERILE (TOWEL DISPOSABLE) ×6 IMPLANT
TOWEL GREEN STERILE FF (TOWEL DISPOSABLE) ×3 IMPLANT
UNDERPAD 30X36 HEAVY ABSORB (UNDERPADS AND DIAPERS) ×3 IMPLANT
WATER STERILE IRR 1000ML POUR (IV SOLUTION) ×3 IMPLANT

## 2023-09-04 NOTE — Op Note (Signed)
 Patient name: Erica Crosby MRN: [id number] DOB: 06-16-1946 Sex: female  09/04/2023 Pre-operative Diagnosis: ESRD Post-operative diagnosis:  Same Surgeon:  Durene Cal Assistants:  Doreatha Massed, PA Procedure:   #1: Ultrasound-guided access, right internal jugular vein   #2: Placement of right internal jugular vein tunneled dialysis catheter under fluoroscopic guidance   #3: Left brachiobasilic fistula (first stage) Anesthesia:  General Blood Loss:  minimal Specimens:  none  Findings: Catheter tip at cavoatrial junction.  Brachial artery was 2.5 mm.  Basilic vein measured 3.5 mm  Indications: This is a 78 year old female who is going to be new to dialysis.  She is here today for catheter and fistula versus graft placement  Procedure:  The patient was identified in the holding area and taken to [facility] OR ROOM 16  The patient was then placed supine on the table. general anesthesia was administered.  The patient was prepped and draped in the usual sterile fashion.  A time out was called and antibiotics were administered.  A PA was necessary to expedite the procedure and assist with technical details.  She helped with exposure by providing suction and retraction.  She helped with the anastomosis by following the suture.  Ultrasound was used to evaluate the right internal jugular vein which was widely patent and easily compressible.  A #11 blade was used to make a skin nick above the clavicle.  The right internal jugular vein was cannulated under ultrasound guidance with a micropuncture needle.  A 018 wire was inserted followed by placement of a micropuncture sheath.  I then directed a J-wire into the inferior vena cava.  The subcutaneous tract was dilated with a dilator and a peel-away sheath was placed.  Next a 23 cm catheter was brought onto the field and used to measure for the skin exit site.  A skin nick was made below the clavicle and then a tunnel was created between the 2 incisions.  The 23  cm dialysis catheter was brought to the tunnel and fed into the peel-away sheath which was then removed.  The catheter tip was at the cavoatrial junction.  There were no kinks within the catheter.  Both ports flushed and aspirated without difficulty and were filled with the appropriate volumes of heparin.  The catheter was sutured to the skin with 3-0 nylon in the neck incision was closed with 4-0 Vicryl and Dermabond.  Surface veins were evaluated in the left upper arm with ultrasound.  The basilic vein appeared to be adequate for fistula creation.  An oblique incision was made just proximal to the antecubital crease.  I first dissected out the brachial artery which was a 2.5 mm artery with mild disease.  I then identified the basilic vein and mobilized it throughout the width of the incision.  Side branches were ligated between silk ties.  The nerve was protected.  The vein was marked for orientation and ligated distally with a 2-0 silk tie.  The vein distended nicely with heparin saline.  The brachial artery was then occluded.  A #11 blade was used to make an arteriotomy which was extended longitudinally with Potts scissors.  The vein was cut the appropriate length and spatulated to fit the size of the arteriotomy.  A running anastomosis was performed with 6-0 Prolene.  Prior to completion, the appropriate flushing maneuvers were performed and the anastomosis was completed.  The clamps were released.  The vein distended appropriately.  I inspected the course of the vein to make  sure there were no kinks.  There is a good thrill within the vein.  Patient had biphasic radial and ulnar Doppler signals.  The wound was then irrigated.  Hemostasis was achieved.  The incision was closed with 2 layers of Vicryl followed by Dermabond.  There were no immediate complications.   Disposition: To PACU stable.   Juleen China, M.D., [facility] [place] Office: [phone] Pager:   [phone]

## 2023-09-04 NOTE — Transfer of Care (Signed)
 Immediate Anesthesia Transfer of Care Note  Patient: Erica Crosby  Procedure(s) Performed: ARTERIOVENOUS (AV) FISTULA CREATION, BRACHIOCEPHALIC (Left) INSERTION OF DIALYSIS CATHETER USING 23CM PALINDROME CATHETER (Right: Neck)  Patient Location: PACU  Anesthesia Type:General  Level of Consciousness: sedated  Airway & Oxygen Therapy: Patient Spontanous Breathing and Patient connected to nasal cannula oxygen  Post-op Assessment: Report given to RN and Post -op Vital signs reviewed and stable  Post vital signs: Reviewed and stable  Last Vitals:  Vitals Value Taken Time  BP 148/62 09/04/23 1210  Temp    Pulse 61 09/04/23 1212  Resp 12 09/04/23 1212  SpO2 100 % 09/04/23 1212  Vitals shown include unfiled device data.  Last Pain:  Vitals:   09/04/23 0909  TempSrc:   PainSc: 0-No pain      Patients Stated Pain Goal: 0 (09/04/23 0909)  Complications: No notable events documented.

## 2023-09-04 NOTE — Anesthesia Postprocedure Evaluation (Signed)
 Anesthesia Post Note  Patient: Erica Crosby  Procedure(s) Performed: ARTERIOVENOUS (AV) FISTULA CREATION, BRACHIOCEPHALIC (Left) INSERTION OF DIALYSIS CATHETER USING 23CM PALINDROME CATHETER (Right: Neck)     Patient location during evaluation: PACU Anesthesia Type: General Level of consciousness: sedated and patient cooperative Pain management: pain level controlled Vital Signs Assessment: post-procedure vital signs reviewed and stable Respiratory status: spontaneous breathing Cardiovascular status: stable Anesthetic complications: no  No notable events documented.  Last Vitals:  Vitals:   09/04/23 1515 09/04/23 1530  BP: (!) 105/46 (!) 124/53  Pulse: 75 77  Resp: (!) 9 11  Temp: (!) 36.4 C   SpO2: 98% 98%    Last Pain:  Vitals:   09/04/23 1515  TempSrc:   PainSc: 0-No pain                 Lewie Loron

## 2023-09-04 NOTE — H&P (Signed)
 [facility] [place]   ASSESSMENT / PLAN: Erica Crosby is a 78 y.o. right handed female in need of permanent dialysis access. I reviewed options for dialysis in detail with the patient, including hemodialysis and peritoneal dialysis. I counseled the patient to ask their nephrologist about their candidacy for renal transplant. I counseled the patient that dialysis access requires surveillance and periodic maintenance. Plan to perform left arm arteriovenous graft whenever nephrology would like [place] to proceed.    CHIEF COMPLAINT: Worsening renal failure   HISTORY OF PRESENT ILLNESS: Erica Crosby is a 78 y.o. female referred to clinic for evaluation of permanent dialysis access.  The patient is a right-handed woman.  She is essentially noncommunicative in clinic.  Her daughter is with her and provides all the history.  Patient has suffered deteriorating renal function over the past years.  She is nearing dialysis dependence.  She has never had dialysis access surgery before.         Past Medical History:  Diagnosis Date   Anemia     Arthritis     Cataract     Coronary artery disease      Mild plaque 2012   Diabetes mellitus     Essential hypertension 02/10/2012   GERD (gastroesophageal reflux disease)     Hyperlipidemia     Hypertension     Neuropathy     Peptic ulcer     Rheumatoid arthritis (HCC)     Syncope 02/10/2012   TIA (transient ischemic attack) 02/10/2012               Past Surgical History:  Procedure Laterality Date   ABSCESS DRAINAGE       CARPAL TUNNEL RELEASE Left     CHOLECYSTECTOMY       KNEE SURGERY Right     NECK SURGERY        x 2   SHOULDER SURGERY Left     TEE WITHOUT CARDIOVERSION N/A 03/12/2022    Procedure: TRANSESOPHAGEAL ECHOCARDIOGRAM (TEE);  Surgeon: Thurmon Fair, MD;  Location: [facility] ENDOSCOPY;  Service: Cardiovascular;  Laterality: N/A;               Family History  Problem Relation Age of Onset   Heart disease Sister           Congeital (died age 40) with "enlarged heart"   CAD Sister     Diabetes Mother     Heart disease Mother          Later onset heart disease    Dementia Mother     Alcohol abuse Father     Liver disease Father     CAD Sister 53        CABG   Early death Sister     Diabetes Brother     Kidney disease Brother          related to DM   Diabetes Brother            Social History         Socioeconomic History   Marital status: Widowed      Spouse name: Not on file   Number of children: 3   Years of education: 12   Highest education level: High school graduate  Occupational History   Occupation: retired  Tobacco Use   Smoking status: Former      Current packs/day: 0.00      Types: Cigarettes, E-cigarettes  Start date: 02/03/1988      Quit date: 02/02/2013      Years since quitting: 10.4   Smokeless tobacco: Never  Vaping Use   Vaping status: Never Used  Substance and Sexual Activity   Alcohol use: No   Drug use: No   Sexual activity: Not Currently      Birth control/protection: Post-menopausal  Other Topics Concern   Not on file  Social History Narrative    Daughter stays with her.     Son comes daily when daughter is working    Haematologist Strain: Low Risk  (11/16/2022)    Overall Financial Resource Strain (CARDIA)     Difficulty of Paying Living Expenses: Not hard at all  Food Insecurity: No Food Insecurity (11/16/2022)    Hunger Vital Sign     Worried About Running Out of Food in the Last Year: Never true     Ran Out of Food in the Last Year: Never true  Transportation Needs: Unmet Transportation Needs (11/16/2022)    PRAPARE - Therapist, art (Medical): Yes     Lack of Transportation (Non-Medical): No  Physical Activity: Insufficiently Active (11/16/2022)    Exercise Vital Sign     Days of Exercise per Week: 7 days     Minutes of Exercise per Session: 20 min  Stress: No Stress  Concern Present (11/16/2022)    [employer] of Occupational Health - Occupational Stress Questionnaire     Feeling of Stress : Not at all  Social Connections: Moderately Integrated (11/16/2022)    Social Connection and Isolation Panel [NHANES]     Frequency of Communication with Friends and Family: Once a week     Frequency of Social Gatherings with Friends and Family: Once a week     Attends Religious Services: 1 to 4 times per year     Active Member of [employer] or Organizations: Yes     Attends Engineer, structural: More than 4 times per year     Marital Status: Married  Catering manager Violence: Not At Risk (11/16/2022)    Humiliation, Afraid, Rape, and Kick questionnaire     Fear of Current or Ex-Partner: No     Emotionally Abused: No     Physically Abused: No     Sexually Abused: No      Allergies       Allergies  Allergen Reactions   Iodinated Contrast Media        Unknown reaction    Latex        Unknown reaction    Lisinopril Swelling      lips   Shellfish Allergy        Unknown Reaction               Current Outpatient Medications  Medication Sig Dispense Refill   amLODipine (NORVASC) 2.5 MG tablet Take 1 tablet (2.5 mg total) by mouth daily. 90 tablet 3   aspirin EC 81 MG tablet Take 81 mg by mouth daily.       atorvastatin (LIPITOR) 10 MG tablet Take 1 tablet (10 mg total) by mouth daily. 90 tablet 3   carvedilol (COREG) 6.25 MG tablet Take 6.25 mg by mouth 2 (two) times daily.       Cholecalciferol 125 MCG (5000 UT) TABS Take 5,000 Units by mouth daily.       Continuous Glucose Sensor (  [name] 2 SENSOR) MISC USE TO TEST BLOOD SUGAR CONTINUOUSLY. Dx: E11.65. ADVANCED DIABETES SUPPLY VIA PARACHUTE 6 each 3   folic acid (FOLVITE) 1 MG tablet Take 1 tablet (1 mg total) by mouth daily. 90 tablet 3   insulin glargine (LANTUS SOLOSTAR) 100 UNIT/ML Solostar Pen Inject 5-10 Units into the skin at bedtime. 15 mL 2   Omega-3 Fatty Acids (FISH OIL) 1000  MG CAPS Take 1,000 mg by mouth daily.       pantoprazole (PROTONIX) 40 MG tablet TAKE ONE TABLET DAILY 90 tablet 0   clobetasol (TEMOVATE) 0.05 % external solution Apply 1 Application topically daily as needed (scalp irriration). (Patient not taking: Reported on 07/02/2023) 50 mL 1      No current facility-administered medications for this visit.        PHYSICAL EXAM    Vitals:    07/02/23 1533  BP: 131/69  Pulse: 73  Weight: 104 lb 9.6 oz (47.4 kg)  Height: 5\' 6"  (1.676 m)    Elderly frail woman in no distress Regular rate and rhythm Unlabored breathing Palpable radial pulses bilaterally   PERTINENT LABORATORY AND RADIOLOGIC DATA   Most recent CBC     Latest Ref Rng & Units 01/03/2023    1:13 PM 12/19/2022    1:26 PM 12/06/2022    1:37 PM  CBC  Hemoglobin 12.0 - 15.0 g/dL 86.5  78.4  69.6       Most recent CMP     Latest Ref Rng & Units 06/20/2023    1:59 PM 05/23/2023    1:35 PM 04/25/2023    2:09 PM  CMP  Glucose 65 - 99 mg/dL 295  97  284   BUN 7 - 25 mg/dL 56  70  54   Creatinine 0.60 - 1.00 mg/dL 1.32  4.40  1.02   Sodium 135 - 146 mmol/L 135  137  138   Potassium 3.5 - 5.3 mmol/L 3.7  4.9  4.5   Chloride 98 - 110 mmol/L 103  104  107   CO2 20 - 32 mmol/L 21  22  22    Calcium 8.6 - 10.4 mg/dL 8.6 - 72.5 mg/dL 9.6    9.6  9.1    9.1  8.6    8.6       Renal function Estimated Creatinine Clearance: 10.9 mL/min (A) (by C-G formula based on SCr of 3.29 mg/dL (H)).   Last Labs     Hemoglobin A1C (no units)  Date Value  10/31/2016 14       HB A1C (BAYER DCA - WAIVED) (%)  Date Value  05/20/2023 6.3 (H)        Last Labs       LDL Chol Calc (NIH)  Date Value Ref Range Status  10/14/2020 77 0 - 99 mg/dL Final      Vein mapping personally reviewed.  No suitable superficial vein for arteriovenous fistula creation.     I discussed again with the patient that we will proceed with left arm access.  If it turns out she has an adequate surface vein, I  would graded fistula.  If not we will place a upper arm graft.  In addition, a tunnel catheter we placed a that she can start dialysis.  Durene Cal

## 2023-09-04 NOTE — Anesthesia Procedure Notes (Signed)
 Procedure Name: Intubation Date/Time: 09/04/2023 10:31 AM  Performed by: Gus Puma, CRNAPre-anesthesia Checklist: Patient identified, Emergency Drugs available, Suction available and Patient being monitored Patient Re-evaluated:Patient Re-evaluated prior to induction Oxygen Delivery Method: Circle System Utilized Preoxygenation: Pre-oxygenation with 100% oxygen Induction Type: IV induction Ventilation: Mask ventilation without difficulty Laryngoscope Size: Mac and 3 Grade View: Grade I Tube type: Oral Tube size: 7.0 mm Number of attempts: 1 Airway Equipment and Method: Stylet Placement Confirmation: ETT inserted through vocal cords under direct vision, positive ETCO2 and breath sounds checked- equal and bilateral Secured at: 21 cm Tube secured with: Tape Dental Injury: Teeth and Oropharynx as per pre-operative assessment

## 2023-09-04 NOTE — Discharge Instructions (Signed)
   [facility]  Discharge Instructions  AV Fistula or Graft Surgery for Dialysis Access  Please refer to the following instructions for your post-procedure care. Your surgeon or physician assistant will discuss any changes with you.  Activity  You may drive the day following your surgery, if you are comfortable and no longer taking prescription pain medication. Resume full activity as the soreness in your incision resolves.  Bathing/Showering  You may shower after you go home. Keep your incision dry for 48 hours. Do not soak in a bathtub, hot tub, or swim until the incision heals completely. You may not shower if you have a hemodialysis catheter.  Incision Care  Clean your incision with mild soap and water after 48 hours. Pat the area dry with a clean towel. You do not need a bandage unless otherwise instructed. Do not apply any ointments or creams to your incision. You may have skin glue on your incision. Do not peel it off. It will come off on its own in about one week. Your arm may swell a bit after surgery. To reduce swelling use pillows to elevate your arm so it is above your heart. Your doctor will tell you if you need to lightly wrap your arm with an ACE bandage.  Diet  Resume your normal diet. There are not special food restrictions following this procedure. In order to heal from your surgery, it is CRITICAL to get adequate nutrition. Your body requires vitamins, minerals, and protein. Vegetables are the best source of vitamins and minerals. Vegetables also provide the perfect balance of protein. Processed food has little nutritional value, so try to avoid this.  Medications  Resume taking all of your medications. If your incision is causing pain, you may take over-the counter pain relievers such as acetaminophen (Tylenol). If you were prescribed a stronger pain medication, please be aware these medications can cause nausea and constipation. Prevent  nausea by taking the medication with a snack or meal. Avoid constipation by drinking plenty of fluids and eating foods with high amount of fiber, such as fruits, vegetables, and grains.  Do not take Tylenol if you are taking prescription pain medications.  Follow up Your surgeon may want to see you in the office following your access surgery. If so, this will be arranged at the time of your surgery.  Please call us immediately for any of the following conditions:  Increased pain, redness, drainage (pus) from your incision site Fever of 101 degrees or higher Severe or worsening pain at your incision site Hand pain or numbness.  Reduce your risk of vascular disease:  Stop smoking. If you would like help, call [facility] at 1-800-QUIT-NOW (815 802 2136) or [place] at (831)368-1118  Manage your cholesterol Maintain a desired weight Control your diabetes Keep your blood pressure down  Dialysis  It will take several weeks to several months for your new dialysis access to be ready for use. Your surgeon will determine when it is okay to use it. Your nephrologist will continue to direct your dialysis. You can continue to use your Permcath until your new access is ready for use.   09/04/2023 [facility] [id number] 1945-07-12  Surgeon(s): Nada Libman, MD  Procedure(s): 1st stage basilic vein transposition Insertion tunneled dialysis catheter  x Do not stick fistula for 12 weeks    If you have any questions, please call the office at [phone].

## 2023-09-05 ENCOUNTER — Encounter (HOSPITAL_COMMUNITY): Payer: Self-pay | Admitting: Surgery

## 2023-09-06 DIAGNOSIS — G629 Polyneuropathy, unspecified: Secondary | ICD-10-CM | POA: Insufficient documentation

## 2023-09-06 DIAGNOSIS — E039 Hypothyroidism, unspecified: Secondary | ICD-10-CM | POA: Diagnosis present

## 2023-09-09 DIAGNOSIS — E1140 Type 2 diabetes mellitus with diabetic neuropathy, unspecified: Secondary | ICD-10-CM | POA: Diagnosis present

## 2023-09-10 DIAGNOSIS — D509 Iron deficiency anemia, unspecified: Secondary | ICD-10-CM | POA: Diagnosis not present

## 2023-09-10 DIAGNOSIS — D689 Coagulation defect, unspecified: Secondary | ICD-10-CM | POA: Diagnosis not present

## 2023-09-10 DIAGNOSIS — N186 End stage renal disease: Secondary | ICD-10-CM | POA: Diagnosis not present

## 2023-09-10 DIAGNOSIS — N2581 Secondary hyperparathyroidism of renal origin: Secondary | ICD-10-CM | POA: Diagnosis not present

## 2023-09-10 DIAGNOSIS — Z992 Dependence on renal dialysis: Secondary | ICD-10-CM | POA: Diagnosis not present

## 2023-09-11 ENCOUNTER — Other Ambulatory Visit: Payer: Self-pay

## 2023-09-12 DIAGNOSIS — D509 Iron deficiency anemia, unspecified: Secondary | ICD-10-CM | POA: Diagnosis not present

## 2023-09-12 DIAGNOSIS — N186 End stage renal disease: Secondary | ICD-10-CM | POA: Diagnosis not present

## 2023-09-12 DIAGNOSIS — D689 Coagulation defect, unspecified: Secondary | ICD-10-CM | POA: Diagnosis not present

## 2023-09-12 DIAGNOSIS — Z992 Dependence on renal dialysis: Secondary | ICD-10-CM | POA: Diagnosis not present

## 2023-09-12 DIAGNOSIS — N2581 Secondary hyperparathyroidism of renal origin: Secondary | ICD-10-CM | POA: Diagnosis not present

## 2023-09-13 ENCOUNTER — Telehealth: Payer: Self-pay

## 2023-09-13 NOTE — Telephone Encounter (Signed)
 Copied from CRM [id number]. Topic: General - Other >> Sep 13, 2023  2:35 PM Erica Crosby wrote: Reason for CRM: Pt has started dialysis and is wanting a lift chair. She wanted to know if you could put in an order for one for her.

## 2023-09-13 NOTE — Telephone Encounter (Signed)
 Called and spoke with daughter. They are able to do a video visit to have a face to face for the lift chair. Scheduled patient for next week with Dr Reece Agar.

## 2023-09-14 DIAGNOSIS — Z992 Dependence on renal dialysis: Secondary | ICD-10-CM | POA: Diagnosis not present

## 2023-09-14 DIAGNOSIS — N186 End stage renal disease: Secondary | ICD-10-CM | POA: Diagnosis not present

## 2023-09-14 DIAGNOSIS — D689 Coagulation defect, unspecified: Secondary | ICD-10-CM | POA: Diagnosis not present

## 2023-09-14 DIAGNOSIS — N2581 Secondary hyperparathyroidism of renal origin: Secondary | ICD-10-CM | POA: Diagnosis not present

## 2023-09-14 DIAGNOSIS — D509 Iron deficiency anemia, unspecified: Secondary | ICD-10-CM | POA: Diagnosis not present

## 2023-09-16 DIAGNOSIS — E1122 Type 2 diabetes mellitus with diabetic chronic kidney disease: Secondary | ICD-10-CM | POA: Diagnosis not present

## 2023-09-16 DIAGNOSIS — Z992 Dependence on renal dialysis: Secondary | ICD-10-CM | POA: Diagnosis not present

## 2023-09-16 DIAGNOSIS — N186 End stage renal disease: Secondary | ICD-10-CM | POA: Diagnosis not present

## 2023-09-17 DIAGNOSIS — E1122 Type 2 diabetes mellitus with diabetic chronic kidney disease: Secondary | ICD-10-CM | POA: Diagnosis not present

## 2023-09-17 DIAGNOSIS — D631 Anemia in chronic kidney disease: Secondary | ICD-10-CM | POA: Diagnosis not present

## 2023-09-17 DIAGNOSIS — Z23 Encounter for immunization: Secondary | ICD-10-CM | POA: Diagnosis not present

## 2023-09-17 DIAGNOSIS — D689 Coagulation defect, unspecified: Secondary | ICD-10-CM | POA: Diagnosis not present

## 2023-09-17 DIAGNOSIS — D509 Iron deficiency anemia, unspecified: Secondary | ICD-10-CM | POA: Diagnosis not present

## 2023-09-17 DIAGNOSIS — N186 End stage renal disease: Secondary | ICD-10-CM | POA: Diagnosis not present

## 2023-09-17 DIAGNOSIS — Z992 Dependence on renal dialysis: Secondary | ICD-10-CM | POA: Diagnosis not present

## 2023-09-17 DIAGNOSIS — N2581 Secondary hyperparathyroidism of renal origin: Secondary | ICD-10-CM | POA: Diagnosis not present

## 2023-09-18 ENCOUNTER — Encounter: Payer: Self-pay | Admitting: Family Medicine

## 2023-09-18 ENCOUNTER — Telehealth (INDEPENDENT_AMBULATORY_CARE_PROVIDER_SITE_OTHER): Admitting: Family Medicine

## 2023-09-18 DIAGNOSIS — R5381 Other malaise: Secondary | ICD-10-CM

## 2023-09-18 DIAGNOSIS — E46 Unspecified protein-calorie malnutrition: Secondary | ICD-10-CM | POA: Insufficient documentation

## 2023-09-18 DIAGNOSIS — Z992 Dependence on renal dialysis: Secondary | ICD-10-CM | POA: Diagnosis not present

## 2023-09-18 DIAGNOSIS — E440 Moderate protein-calorie malnutrition: Secondary | ICD-10-CM

## 2023-09-18 DIAGNOSIS — Z7409 Other reduced mobility: Secondary | ICD-10-CM

## 2023-09-18 DIAGNOSIS — N186 End stage renal disease: Secondary | ICD-10-CM

## 2023-09-18 DIAGNOSIS — Z789 Other specified health status: Secondary | ICD-10-CM | POA: Insufficient documentation

## 2023-09-18 NOTE — Progress Notes (Signed)
 MyChart Video visit  Subjective: CC: weakness PCP: Raliegh Ip, DO HPI:Erica Crosby is a 78 y.o. female. Patient provides verbal consent for consult held via video.  Due to COVID-19 pandemic this visit was conducted virtually. This visit type was conducted due to national recommendations for restrictions regarding the COVID-19 Pandemic (e.g. social distancing, sheltering in place) in an effort to limit this patient's exposure and mitigate transmission in our community. All issues noted in this document were discussed and addressed.  A physical exam was not performed with this format.   Location of patient: home Location of provider: [facility] Others present for call: Cathlyn Parsons (daughter)  1.  Physical deconditioning Patient with known impairment in mobility and ADLs.  At baseline she utilizes a cane and she still gets around with a cane but now requiring some assistance intermittently by her family members.  Her daughter Cathlyn Parsons is on the phone today with the patient and provides some of this information.  She notes that she has been having worsening trouble getting up from a seated position.  She does have a hospital bed that they purchased with railing so that she does not fall out of bed.  They report no recent falls but some instability.  She always has to have somebody help her get up from a seated position but there is not always somebody at home to help her do this, hence the request for a lift chair.  Her last visit she started hemodialysis.  She has a port in the right upper chest and they just placed a fistula in her left arm.  Though that has not yet matured and they are not utilizing that access yet.  She is going Tuesdays, Thursdays and Saturdays to hemodialysis in [place].  So far she seems to be tolerating this without difficulty.   ROS: Per HPI  Allergies  Allergen Reactions   Iodinated Contrast Media     Unknown reaction    Latex     Unknown reaction    Lisinopril Swelling     lips   Shellfish Allergy     Unknown Reaction    Past Medical History:  Diagnosis Date   Anemia    Arthritis    Cataract    Coronary artery disease    Mild plaque 2012   Diabetes mellitus    Essential hypertension 02/10/2012   GERD (gastroesophageal reflux disease)    Hyperlipidemia    Hypertension    Neuropathy    Peptic ulcer    Rheumatoid arthritis (HCC)    Syncope 02/10/2012    Current Outpatient Medications:    acetaminophen (TYLENOL) 500 MG tablet, Take 500 mg by mouth every 6 (six) hours as needed for moderate pain (pain score 4-6)., Disp: , Rfl:    amLODipine (NORVASC) 2.5 MG tablet, Take 1 tablet (2.5 mg total) by mouth daily., Disp: 90 tablet, Rfl: 3   aspirin EC 81 MG tablet, Take 81 mg by mouth daily., Disp: , Rfl:    atorvastatin (LIPITOR) 10 MG tablet, Take 1 tablet (10 mg total) by mouth daily., Disp: 90 tablet, Rfl: 3   carvedilol (COREG) 6.25 MG tablet, Take 6.25 mg by mouth 2 (two) times daily., Disp: , Rfl:    Cholecalciferol (VITAMIN D-3) 125 MCG (5000 UT) TABS, Take 5,000 Units by mouth daily., Disp: , Rfl:    Continuous Glucose Sensor ([name] 2 SENSOR) MISC, USE TO TEST BLOOD SUGAR CONTINUOUSLY. Dx: E11.65. ADVANCED DIABETES SUPPLY VIA PARACHUTE, Disp: 6 each, Rfl:  3   folic acid (FOLVITE) 1 MG tablet, Take 1 tablet (1 mg total) by mouth daily., Disp: 90 tablet, Rfl: 3   haloperidol (HALDOL) 1 MG tablet, Take 1 tablet (1 mg total) by mouth every 6 (six) hours as needed for agitation., Disp: 30 tablet, Rfl: 0   insulin glargine (LANTUS SOLOSTAR) 100 UNIT/ML Solostar Pen, Inject 5-10 Units into the skin at bedtime., Disp: 15 mL, Rfl: 2   Omega-3 Fatty Acids (FISH OIL) 1000 MG CAPS, Take 1,000 mg by mouth daily., Disp: , Rfl:    oxyCODONE-acetaminophen (PERCOCET) 5-325 MG tablet, Take 1 tablet by mouth every 6 (six) hours as needed for severe pain (pain score 7-10)., Disp: 8 tablet, Rfl: 0   pantoprazole (PROTONIX) 40 MG tablet, TAKE ONE TABLET  DAILY, Disp: 90 tablet, Rfl: 0  Gen: Frail-appearing elderly female.  No acute distress MSK: Ambulation not observed.  Could not get up from seated position independently.  Assessment/ Plan: 78 y.o. female   Physical deconditioning - Plan: For home use only DME Other see comment  Moderate protein-calorie malnutrition (HCC) - Plan: For home use only DME Other see comment  End-stage renal disease on hemodialysis (HCC) - Plan: For home use only DME Other see comment  Impaired mobility and ADLs - Plan: For home use only DME Other see comment  Clinically appears to be malnourished, frail.  I agree that she needs additional assistance to rise from seated position and will order lift chair for assistance.  This cannot be solved by utilization of a walker, wheelchair or cane.  Her family members are present most the time but not always.  Patient is a fall risk  Continue hemodialysis as scheduled.  Follow-up with me as directed  Start time: 10:00a End time: 10:11a  Total time spent on patient care (including video visit/ documentation): 15 minutes  Criston Chancellor Hulen Skains, DO [facility] [phone]

## 2023-09-19 ENCOUNTER — Telehealth: Payer: Self-pay

## 2023-09-19 ENCOUNTER — Ambulatory Visit

## 2023-09-19 DIAGNOSIS — D689 Coagulation defect, unspecified: Secondary | ICD-10-CM | POA: Diagnosis not present

## 2023-09-19 DIAGNOSIS — N2581 Secondary hyperparathyroidism of renal origin: Secondary | ICD-10-CM | POA: Diagnosis not present

## 2023-09-19 DIAGNOSIS — D631 Anemia in chronic kidney disease: Secondary | ICD-10-CM | POA: Diagnosis not present

## 2023-09-19 DIAGNOSIS — Z23 Encounter for immunization: Secondary | ICD-10-CM | POA: Diagnosis not present

## 2023-09-19 DIAGNOSIS — N186 End stage renal disease: Secondary | ICD-10-CM | POA: Diagnosis not present

## 2023-09-19 DIAGNOSIS — D509 Iron deficiency anemia, unspecified: Secondary | ICD-10-CM | POA: Diagnosis not present

## 2023-09-19 DIAGNOSIS — E1122 Type 2 diabetes mellitus with diabetic chronic kidney disease: Secondary | ICD-10-CM | POA: Diagnosis not present

## 2023-09-19 DIAGNOSIS — Z992 Dependence on renal dialysis: Secondary | ICD-10-CM | POA: Diagnosis not present

## 2023-09-19 NOTE — Telephone Encounter (Signed)
 Copied from CRM [id number]. Topic: Referral - Prior Authorization Question >> Sep 18, 2023  4:47 PM DeAngela L wrote: Reason for CRM: Patient daughter call asking if the office could send a prescription for lift chair to [employer] and they will send the information to the place taking care of the lift chair and they will get this covered

## 2023-09-20 NOTE — Telephone Encounter (Signed)
 See message below

## 2023-09-20 NOTE — Telephone Encounter (Signed)
 LMOVM to find out how this is to be sent to [facility], there is no fax # or anything on her ins card.

## 2023-09-21 DIAGNOSIS — D689 Coagulation defect, unspecified: Secondary | ICD-10-CM | POA: Diagnosis not present

## 2023-09-21 DIAGNOSIS — D509 Iron deficiency anemia, unspecified: Secondary | ICD-10-CM | POA: Diagnosis not present

## 2023-09-21 DIAGNOSIS — N2581 Secondary hyperparathyroidism of renal origin: Secondary | ICD-10-CM | POA: Diagnosis not present

## 2023-09-21 DIAGNOSIS — D631 Anemia in chronic kidney disease: Secondary | ICD-10-CM | POA: Diagnosis not present

## 2023-09-21 DIAGNOSIS — Z992 Dependence on renal dialysis: Secondary | ICD-10-CM | POA: Diagnosis not present

## 2023-09-21 DIAGNOSIS — Z23 Encounter for immunization: Secondary | ICD-10-CM | POA: Diagnosis not present

## 2023-09-21 DIAGNOSIS — E1122 Type 2 diabetes mellitus with diabetic chronic kidney disease: Secondary | ICD-10-CM | POA: Diagnosis not present

## 2023-09-21 DIAGNOSIS — N186 End stage renal disease: Secondary | ICD-10-CM | POA: Diagnosis not present

## 2023-09-24 ENCOUNTER — Other Ambulatory Visit: Payer: Self-pay | Admitting: Family Medicine

## 2023-09-24 ENCOUNTER — Telehealth: Payer: Self-pay

## 2023-09-24 DIAGNOSIS — E1122 Type 2 diabetes mellitus with diabetic chronic kidney disease: Secondary | ICD-10-CM | POA: Diagnosis not present

## 2023-09-24 DIAGNOSIS — D631 Anemia in chronic kidney disease: Secondary | ICD-10-CM | POA: Diagnosis not present

## 2023-09-24 DIAGNOSIS — D689 Coagulation defect, unspecified: Secondary | ICD-10-CM | POA: Diagnosis not present

## 2023-09-24 DIAGNOSIS — N186 End stage renal disease: Secondary | ICD-10-CM | POA: Diagnosis not present

## 2023-09-24 DIAGNOSIS — D509 Iron deficiency anemia, unspecified: Secondary | ICD-10-CM | POA: Diagnosis not present

## 2023-09-24 DIAGNOSIS — Z992 Dependence on renal dialysis: Secondary | ICD-10-CM | POA: Diagnosis not present

## 2023-09-24 DIAGNOSIS — Z23 Encounter for immunization: Secondary | ICD-10-CM | POA: Diagnosis not present

## 2023-09-24 DIAGNOSIS — N2581 Secondary hyperparathyroidism of renal origin: Secondary | ICD-10-CM | POA: Diagnosis not present

## 2023-09-24 MED ORDER — LANTUS SOLOSTAR 100 UNIT/ML ~~LOC~~ SOPN: 5.0000 [IU] | PEN_INJECTOR | Freq: Every day | SUBCUTANEOUS | 2 refills | Status: DC

## 2023-09-24 NOTE — Telephone Encounter (Signed)
 Last Fill: 12/31/19  Last OV: 05/20/23 Next OV: 11/18/23 AWV  Routing to provider for review/authorization.

## 2023-09-24 NOTE — Telephone Encounter (Signed)
Faxed to [facility] [phone]

## 2023-09-24 NOTE — Telephone Encounter (Signed)
 Closing encounter, finished in another.

## 2023-09-24 NOTE — Telephone Encounter (Signed)
 LMOVM to find out where/fax #f or [employer] to send order for lift chair

## 2023-09-24 NOTE — Telephone Encounter (Signed)
 TC back that [employer] [facility] does not provide this item and the other 2 companies only provide respiratory items. Daughter said that she is going to order her one online and not bother with going through insurance.

## 2023-09-24 NOTE — Telephone Encounter (Signed)
 Copied from CRM [id number]. Topic: Referral - Prior Authorization Question >> Sep 18, 2023  4:47 PM DeAngela L wrote: Reason for CRM: Patient daughter call asking if the office could send a prescription for lift chair to [employer] and they will send the information to the place taking care of the lift chair and they will get this covered >> Sep 24, 2023 11:17 AM Suzanne Boron W wrote: Patient daughter returned missed call. States Insurance gave her 3 places order could be sent to:  [facility] [phone]  [facility] [phone]  [facility] [phone]  Unsure if phone or fax was provided. Thanks

## 2023-09-24 NOTE — Telephone Encounter (Signed)
 Copied from CRM [id number]. Topic: Clinical - Medication Refill >> Sep 24, 2023 11:13 AM Fuller Mandril wrote: Most Recent Primary Care Visit:   Medication: insulin glargine (LANTUS SOLOSTAR) 100 UNIT/ML Solostar Pen  Has the patient contacted their pharmacy? No - request to change pharmacy  (Agent: If no, request that the patient contact the pharmacy for the refill. If patient does not wish to contact the pharmacy document the reason why and proceed with request.) (Agent: If yes, when and what did the pharmacy advise?)  Is this the correct pharmacy for this prescription? Yes If no, delete pharmacy and type the correct one.  This is the patient's preferred pharmacy:  [facility] [place], [place] - [address] [place] [address] Phone: [phone] Fax: [phone]   Has the prescription been filled recently? No  Is the patient out of the medication? Yes - has not taking in it a while but needs to start back   Has the patient been seen for an appointment in the last year OR does the patient have an upcoming appointment? Yes  Can we respond through MyChart? Yes  Agent: Please be advised that Rx refills may take up to 3 business days. We ask that you follow-up with your pharmacy.

## 2023-09-26 DIAGNOSIS — N186 End stage renal disease: Secondary | ICD-10-CM | POA: Diagnosis not present

## 2023-09-26 DIAGNOSIS — D631 Anemia in chronic kidney disease: Secondary | ICD-10-CM | POA: Diagnosis not present

## 2023-09-26 DIAGNOSIS — N2581 Secondary hyperparathyroidism of renal origin: Secondary | ICD-10-CM | POA: Diagnosis not present

## 2023-09-26 DIAGNOSIS — Z992 Dependence on renal dialysis: Secondary | ICD-10-CM | POA: Diagnosis not present

## 2023-09-26 DIAGNOSIS — D689 Coagulation defect, unspecified: Secondary | ICD-10-CM | POA: Diagnosis not present

## 2023-09-26 DIAGNOSIS — Z23 Encounter for immunization: Secondary | ICD-10-CM | POA: Diagnosis not present

## 2023-09-26 DIAGNOSIS — E1122 Type 2 diabetes mellitus with diabetic chronic kidney disease: Secondary | ICD-10-CM | POA: Diagnosis not present

## 2023-09-26 DIAGNOSIS — D509 Iron deficiency anemia, unspecified: Secondary | ICD-10-CM | POA: Diagnosis not present

## 2023-09-28 DIAGNOSIS — N186 End stage renal disease: Secondary | ICD-10-CM | POA: Diagnosis not present

## 2023-09-28 DIAGNOSIS — Z23 Encounter for immunization: Secondary | ICD-10-CM | POA: Diagnosis not present

## 2023-09-28 DIAGNOSIS — Z992 Dependence on renal dialysis: Secondary | ICD-10-CM | POA: Diagnosis not present

## 2023-09-28 DIAGNOSIS — D509 Iron deficiency anemia, unspecified: Secondary | ICD-10-CM | POA: Diagnosis not present

## 2023-09-28 DIAGNOSIS — D631 Anemia in chronic kidney disease: Secondary | ICD-10-CM | POA: Diagnosis not present

## 2023-09-28 DIAGNOSIS — D689 Coagulation defect, unspecified: Secondary | ICD-10-CM | POA: Diagnosis not present

## 2023-09-28 DIAGNOSIS — E1122 Type 2 diabetes mellitus with diabetic chronic kidney disease: Secondary | ICD-10-CM | POA: Diagnosis not present

## 2023-09-28 DIAGNOSIS — N2581 Secondary hyperparathyroidism of renal origin: Secondary | ICD-10-CM | POA: Diagnosis not present

## 2023-10-01 DIAGNOSIS — D631 Anemia in chronic kidney disease: Secondary | ICD-10-CM | POA: Diagnosis not present

## 2023-10-01 DIAGNOSIS — N2581 Secondary hyperparathyroidism of renal origin: Secondary | ICD-10-CM | POA: Diagnosis not present

## 2023-10-01 DIAGNOSIS — D689 Coagulation defect, unspecified: Secondary | ICD-10-CM | POA: Diagnosis not present

## 2023-10-01 DIAGNOSIS — N186 End stage renal disease: Secondary | ICD-10-CM | POA: Diagnosis not present

## 2023-10-01 DIAGNOSIS — D509 Iron deficiency anemia, unspecified: Secondary | ICD-10-CM | POA: Diagnosis not present

## 2023-10-01 DIAGNOSIS — Z992 Dependence on renal dialysis: Secondary | ICD-10-CM | POA: Diagnosis not present

## 2023-10-01 DIAGNOSIS — Z23 Encounter for immunization: Secondary | ICD-10-CM | POA: Diagnosis not present

## 2023-10-01 DIAGNOSIS — E1122 Type 2 diabetes mellitus with diabetic chronic kidney disease: Secondary | ICD-10-CM | POA: Diagnosis not present

## 2023-10-03 DIAGNOSIS — N2581 Secondary hyperparathyroidism of renal origin: Secondary | ICD-10-CM | POA: Diagnosis not present

## 2023-10-03 DIAGNOSIS — E1122 Type 2 diabetes mellitus with diabetic chronic kidney disease: Secondary | ICD-10-CM | POA: Diagnosis not present

## 2023-10-03 DIAGNOSIS — D689 Coagulation defect, unspecified: Secondary | ICD-10-CM | POA: Diagnosis not present

## 2023-10-03 DIAGNOSIS — D509 Iron deficiency anemia, unspecified: Secondary | ICD-10-CM | POA: Diagnosis not present

## 2023-10-03 DIAGNOSIS — D631 Anemia in chronic kidney disease: Secondary | ICD-10-CM | POA: Diagnosis not present

## 2023-10-03 DIAGNOSIS — N186 End stage renal disease: Secondary | ICD-10-CM | POA: Diagnosis not present

## 2023-10-03 DIAGNOSIS — Z992 Dependence on renal dialysis: Secondary | ICD-10-CM | POA: Diagnosis not present

## 2023-10-03 DIAGNOSIS — Z23 Encounter for immunization: Secondary | ICD-10-CM | POA: Diagnosis not present

## 2023-10-05 DIAGNOSIS — Z23 Encounter for immunization: Secondary | ICD-10-CM | POA: Diagnosis not present

## 2023-10-05 DIAGNOSIS — E1122 Type 2 diabetes mellitus with diabetic chronic kidney disease: Secondary | ICD-10-CM | POA: Diagnosis not present

## 2023-10-05 DIAGNOSIS — D689 Coagulation defect, unspecified: Secondary | ICD-10-CM | POA: Diagnosis not present

## 2023-10-05 DIAGNOSIS — D509 Iron deficiency anemia, unspecified: Secondary | ICD-10-CM | POA: Diagnosis not present

## 2023-10-05 DIAGNOSIS — N186 End stage renal disease: Secondary | ICD-10-CM | POA: Diagnosis not present

## 2023-10-05 DIAGNOSIS — D631 Anemia in chronic kidney disease: Secondary | ICD-10-CM | POA: Diagnosis not present

## 2023-10-05 DIAGNOSIS — N2581 Secondary hyperparathyroidism of renal origin: Secondary | ICD-10-CM | POA: Diagnosis not present

## 2023-10-05 DIAGNOSIS — Z992 Dependence on renal dialysis: Secondary | ICD-10-CM | POA: Diagnosis not present

## 2023-10-08 DIAGNOSIS — Z992 Dependence on renal dialysis: Secondary | ICD-10-CM | POA: Diagnosis not present

## 2023-10-08 DIAGNOSIS — D631 Anemia in chronic kidney disease: Secondary | ICD-10-CM | POA: Diagnosis not present

## 2023-10-08 DIAGNOSIS — D689 Coagulation defect, unspecified: Secondary | ICD-10-CM | POA: Diagnosis not present

## 2023-10-08 DIAGNOSIS — D509 Iron deficiency anemia, unspecified: Secondary | ICD-10-CM | POA: Diagnosis not present

## 2023-10-08 DIAGNOSIS — Z23 Encounter for immunization: Secondary | ICD-10-CM | POA: Diagnosis not present

## 2023-10-08 DIAGNOSIS — E1122 Type 2 diabetes mellitus with diabetic chronic kidney disease: Secondary | ICD-10-CM | POA: Diagnosis not present

## 2023-10-08 DIAGNOSIS — N2581 Secondary hyperparathyroidism of renal origin: Secondary | ICD-10-CM | POA: Diagnosis not present

## 2023-10-08 DIAGNOSIS — N186 End stage renal disease: Secondary | ICD-10-CM | POA: Diagnosis not present

## 2023-10-10 DIAGNOSIS — N186 End stage renal disease: Secondary | ICD-10-CM | POA: Diagnosis not present

## 2023-10-10 DIAGNOSIS — D689 Coagulation defect, unspecified: Secondary | ICD-10-CM | POA: Diagnosis not present

## 2023-10-10 DIAGNOSIS — N2581 Secondary hyperparathyroidism of renal origin: Secondary | ICD-10-CM | POA: Diagnosis not present

## 2023-10-10 DIAGNOSIS — E1122 Type 2 diabetes mellitus with diabetic chronic kidney disease: Secondary | ICD-10-CM | POA: Diagnosis not present

## 2023-10-10 DIAGNOSIS — Z23 Encounter for immunization: Secondary | ICD-10-CM | POA: Diagnosis not present

## 2023-10-10 DIAGNOSIS — D631 Anemia in chronic kidney disease: Secondary | ICD-10-CM | POA: Diagnosis not present

## 2023-10-10 DIAGNOSIS — Z992 Dependence on renal dialysis: Secondary | ICD-10-CM | POA: Diagnosis not present

## 2023-10-10 DIAGNOSIS — D509 Iron deficiency anemia, unspecified: Secondary | ICD-10-CM | POA: Diagnosis not present

## 2023-10-12 DIAGNOSIS — D509 Iron deficiency anemia, unspecified: Secondary | ICD-10-CM | POA: Diagnosis not present

## 2023-10-12 DIAGNOSIS — D689 Coagulation defect, unspecified: Secondary | ICD-10-CM | POA: Diagnosis not present

## 2023-10-12 DIAGNOSIS — Z992 Dependence on renal dialysis: Secondary | ICD-10-CM | POA: Diagnosis not present

## 2023-10-12 DIAGNOSIS — Z23 Encounter for immunization: Secondary | ICD-10-CM | POA: Diagnosis not present

## 2023-10-12 DIAGNOSIS — N186 End stage renal disease: Secondary | ICD-10-CM | POA: Diagnosis not present

## 2023-10-12 DIAGNOSIS — N2581 Secondary hyperparathyroidism of renal origin: Secondary | ICD-10-CM | POA: Diagnosis not present

## 2023-10-12 DIAGNOSIS — E1122 Type 2 diabetes mellitus with diabetic chronic kidney disease: Secondary | ICD-10-CM | POA: Diagnosis not present

## 2023-10-12 DIAGNOSIS — D631 Anemia in chronic kidney disease: Secondary | ICD-10-CM | POA: Diagnosis not present

## 2023-10-15 DIAGNOSIS — N186 End stage renal disease: Secondary | ICD-10-CM | POA: Diagnosis not present

## 2023-10-15 DIAGNOSIS — N2581 Secondary hyperparathyroidism of renal origin: Secondary | ICD-10-CM | POA: Diagnosis not present

## 2023-10-15 DIAGNOSIS — E1122 Type 2 diabetes mellitus with diabetic chronic kidney disease: Secondary | ICD-10-CM | POA: Diagnosis not present

## 2023-10-15 DIAGNOSIS — D689 Coagulation defect, unspecified: Secondary | ICD-10-CM | POA: Diagnosis not present

## 2023-10-15 DIAGNOSIS — Z23 Encounter for immunization: Secondary | ICD-10-CM | POA: Diagnosis not present

## 2023-10-15 DIAGNOSIS — Z992 Dependence on renal dialysis: Secondary | ICD-10-CM | POA: Diagnosis not present

## 2023-10-15 DIAGNOSIS — D509 Iron deficiency anemia, unspecified: Secondary | ICD-10-CM | POA: Diagnosis not present

## 2023-10-15 DIAGNOSIS — D631 Anemia in chronic kidney disease: Secondary | ICD-10-CM | POA: Diagnosis not present

## 2023-10-16 ENCOUNTER — Other Ambulatory Visit: Payer: Self-pay

## 2023-10-16 DIAGNOSIS — N186 End stage renal disease: Secondary | ICD-10-CM | POA: Diagnosis not present

## 2023-10-16 DIAGNOSIS — Z992 Dependence on renal dialysis: Secondary | ICD-10-CM | POA: Diagnosis not present

## 2023-10-16 DIAGNOSIS — E1122 Type 2 diabetes mellitus with diabetic chronic kidney disease: Secondary | ICD-10-CM | POA: Diagnosis not present

## 2023-10-17 DIAGNOSIS — Z23 Encounter for immunization: Secondary | ICD-10-CM | POA: Diagnosis not present

## 2023-10-17 DIAGNOSIS — N2581 Secondary hyperparathyroidism of renal origin: Secondary | ICD-10-CM | POA: Diagnosis not present

## 2023-10-17 DIAGNOSIS — D689 Coagulation defect, unspecified: Secondary | ICD-10-CM | POA: Diagnosis not present

## 2023-10-17 DIAGNOSIS — R52 Pain, unspecified: Secondary | ICD-10-CM | POA: Diagnosis not present

## 2023-10-17 DIAGNOSIS — N186 End stage renal disease: Secondary | ICD-10-CM | POA: Diagnosis not present

## 2023-10-17 DIAGNOSIS — E1122 Type 2 diabetes mellitus with diabetic chronic kidney disease: Secondary | ICD-10-CM | POA: Diagnosis not present

## 2023-10-17 DIAGNOSIS — D509 Iron deficiency anemia, unspecified: Secondary | ICD-10-CM | POA: Diagnosis not present

## 2023-10-17 DIAGNOSIS — Z992 Dependence on renal dialysis: Secondary | ICD-10-CM | POA: Diagnosis not present

## 2023-10-17 DIAGNOSIS — D631 Anemia in chronic kidney disease: Secondary | ICD-10-CM | POA: Diagnosis not present

## 2023-10-17 DIAGNOSIS — E039 Hypothyroidism, unspecified: Secondary | ICD-10-CM | POA: Diagnosis not present

## 2023-10-19 DIAGNOSIS — Z23 Encounter for immunization: Secondary | ICD-10-CM | POA: Diagnosis not present

## 2023-10-19 DIAGNOSIS — Z992 Dependence on renal dialysis: Secondary | ICD-10-CM | POA: Diagnosis not present

## 2023-10-19 DIAGNOSIS — D689 Coagulation defect, unspecified: Secondary | ICD-10-CM | POA: Diagnosis not present

## 2023-10-19 DIAGNOSIS — R52 Pain, unspecified: Secondary | ICD-10-CM | POA: Diagnosis not present

## 2023-10-19 DIAGNOSIS — D509 Iron deficiency anemia, unspecified: Secondary | ICD-10-CM | POA: Diagnosis not present

## 2023-10-19 DIAGNOSIS — E1122 Type 2 diabetes mellitus with diabetic chronic kidney disease: Secondary | ICD-10-CM | POA: Diagnosis not present

## 2023-10-19 DIAGNOSIS — D631 Anemia in chronic kidney disease: Secondary | ICD-10-CM | POA: Diagnosis not present

## 2023-10-19 DIAGNOSIS — N2581 Secondary hyperparathyroidism of renal origin: Secondary | ICD-10-CM | POA: Diagnosis not present

## 2023-10-19 DIAGNOSIS — E039 Hypothyroidism, unspecified: Secondary | ICD-10-CM | POA: Diagnosis not present

## 2023-10-19 DIAGNOSIS — N186 End stage renal disease: Secondary | ICD-10-CM | POA: Diagnosis not present

## 2023-10-21 ENCOUNTER — Ambulatory Visit (HOSPITAL_COMMUNITY)
Admission: RE | Admit: 2023-10-21 | Discharge: 2023-10-21 | Disposition: A | Discharge status: Home / Self Care | Source: Ambulatory Visit | Attending: Surgery | Admitting: Surgery

## 2023-10-21 ENCOUNTER — Telehealth: Payer: Self-pay

## 2023-10-21 ENCOUNTER — Ambulatory Visit: Admitting: Physician Assistant

## 2023-10-21 VITALS — BP 119/64 | HR 69 | Temp 98.0°F | Wt 102.3 lb

## 2023-10-21 DIAGNOSIS — N186 End stage renal disease: Secondary | ICD-10-CM

## 2023-10-21 NOTE — Telephone Encounter (Signed)
 Attempted to call for surgery scheduling. LVM
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
## 2023-10-22 DIAGNOSIS — D631 Anemia in chronic kidney disease: Secondary | ICD-10-CM | POA: Diagnosis not present

## 2023-10-22 DIAGNOSIS — E1122 Type 2 diabetes mellitus with diabetic chronic kidney disease: Secondary | ICD-10-CM | POA: Diagnosis not present

## 2023-10-22 DIAGNOSIS — D689 Coagulation defect, unspecified: Secondary | ICD-10-CM | POA: Diagnosis not present

## 2023-10-22 DIAGNOSIS — N186 End stage renal disease: Secondary | ICD-10-CM | POA: Diagnosis not present

## 2023-10-22 DIAGNOSIS — D509 Iron deficiency anemia, unspecified: Secondary | ICD-10-CM | POA: Diagnosis not present

## 2023-10-22 DIAGNOSIS — E039 Hypothyroidism, unspecified: Secondary | ICD-10-CM | POA: Diagnosis not present

## 2023-10-22 DIAGNOSIS — Z23 Encounter for immunization: Secondary | ICD-10-CM | POA: Diagnosis not present

## 2023-10-22 DIAGNOSIS — R52 Pain, unspecified: Secondary | ICD-10-CM | POA: Diagnosis not present

## 2023-10-22 DIAGNOSIS — N2581 Secondary hyperparathyroidism of renal origin: Secondary | ICD-10-CM | POA: Diagnosis not present

## 2023-10-22 DIAGNOSIS — Z992 Dependence on renal dialysis: Secondary | ICD-10-CM | POA: Diagnosis not present

## 2023-10-24 ENCOUNTER — Telehealth: Payer: Self-pay

## 2023-10-24 DIAGNOSIS — R52 Pain, unspecified: Secondary | ICD-10-CM | POA: Diagnosis not present

## 2023-10-24 DIAGNOSIS — N2581 Secondary hyperparathyroidism of renal origin: Secondary | ICD-10-CM | POA: Diagnosis not present

## 2023-10-24 DIAGNOSIS — Z992 Dependence on renal dialysis: Secondary | ICD-10-CM | POA: Diagnosis not present

## 2023-10-24 DIAGNOSIS — N186 End stage renal disease: Secondary | ICD-10-CM | POA: Diagnosis not present

## 2023-10-24 DIAGNOSIS — E1122 Type 2 diabetes mellitus with diabetic chronic kidney disease: Secondary | ICD-10-CM | POA: Diagnosis not present

## 2023-10-24 DIAGNOSIS — E039 Hypothyroidism, unspecified: Secondary | ICD-10-CM | POA: Diagnosis not present

## 2023-10-24 DIAGNOSIS — Z23 Encounter for immunization: Secondary | ICD-10-CM | POA: Diagnosis not present

## 2023-10-24 DIAGNOSIS — D631 Anemia in chronic kidney disease: Secondary | ICD-10-CM | POA: Diagnosis not present

## 2023-10-24 DIAGNOSIS — D509 Iron deficiency anemia, unspecified: Secondary | ICD-10-CM | POA: Diagnosis not present

## 2023-10-24 DIAGNOSIS — D689 Coagulation defect, unspecified: Secondary | ICD-10-CM | POA: Diagnosis not present

## 2023-10-24 NOTE — Telephone Encounter (Signed)
 Daughter answered phone then phone was disconnected.

## 2023-10-26 DIAGNOSIS — E1122 Type 2 diabetes mellitus with diabetic chronic kidney disease: Secondary | ICD-10-CM | POA: Diagnosis not present

## 2023-10-26 DIAGNOSIS — R52 Pain, unspecified: Secondary | ICD-10-CM | POA: Diagnosis not present

## 2023-10-26 DIAGNOSIS — Z23 Encounter for immunization: Secondary | ICD-10-CM | POA: Diagnosis not present

## 2023-10-26 DIAGNOSIS — E039 Hypothyroidism, unspecified: Secondary | ICD-10-CM | POA: Diagnosis not present

## 2023-10-26 DIAGNOSIS — D689 Coagulation defect, unspecified: Secondary | ICD-10-CM | POA: Diagnosis not present

## 2023-10-26 DIAGNOSIS — D509 Iron deficiency anemia, unspecified: Secondary | ICD-10-CM | POA: Diagnosis not present

## 2023-10-26 DIAGNOSIS — D631 Anemia in chronic kidney disease: Secondary | ICD-10-CM | POA: Diagnosis not present

## 2023-10-26 DIAGNOSIS — Z992 Dependence on renal dialysis: Secondary | ICD-10-CM | POA: Diagnosis not present

## 2023-10-26 DIAGNOSIS — N2581 Secondary hyperparathyroidism of renal origin: Secondary | ICD-10-CM | POA: Diagnosis not present

## 2023-10-26 DIAGNOSIS — N186 End stage renal disease: Secondary | ICD-10-CM | POA: Diagnosis not present

## 2023-10-28 ENCOUNTER — Other Ambulatory Visit: Payer: Self-pay

## 2023-10-28 ENCOUNTER — Telehealth: Payer: Self-pay

## 2023-10-28 DIAGNOSIS — N186 End stage renal disease: Secondary | ICD-10-CM

## 2023-10-28 NOTE — Telephone Encounter (Signed)
 Attempted to call for surgery scheduling. LVM

## 2023-10-29 DIAGNOSIS — E039 Hypothyroidism, unspecified: Secondary | ICD-10-CM | POA: Diagnosis not present

## 2023-10-29 DIAGNOSIS — Z23 Encounter for immunization: Secondary | ICD-10-CM | POA: Diagnosis not present

## 2023-10-29 DIAGNOSIS — D689 Coagulation defect, unspecified: Secondary | ICD-10-CM | POA: Diagnosis not present

## 2023-10-29 DIAGNOSIS — D509 Iron deficiency anemia, unspecified: Secondary | ICD-10-CM | POA: Diagnosis not present

## 2023-10-29 DIAGNOSIS — D631 Anemia in chronic kidney disease: Secondary | ICD-10-CM | POA: Diagnosis not present

## 2023-10-29 DIAGNOSIS — N186 End stage renal disease: Secondary | ICD-10-CM | POA: Diagnosis not present

## 2023-10-29 DIAGNOSIS — E1122 Type 2 diabetes mellitus with diabetic chronic kidney disease: Secondary | ICD-10-CM | POA: Diagnosis not present

## 2023-10-29 DIAGNOSIS — R52 Pain, unspecified: Secondary | ICD-10-CM | POA: Diagnosis not present

## 2023-10-29 DIAGNOSIS — Z992 Dependence on renal dialysis: Secondary | ICD-10-CM | POA: Diagnosis not present

## 2023-10-29 DIAGNOSIS — N2581 Secondary hyperparathyroidism of renal origin: Secondary | ICD-10-CM | POA: Diagnosis not present

## 2023-10-31 ENCOUNTER — Emergency Department (HOSPITAL_COMMUNITY)

## 2023-10-31 ENCOUNTER — Encounter (HOSPITAL_COMMUNITY): Payer: Self-pay | Admitting: Emergency Medicine

## 2023-10-31 ENCOUNTER — Inpatient Hospital Stay (HOSPITAL_COMMUNITY)
Admission: EM | Admit: 2023-10-31 | Discharge: 2023-11-06 | DRG: 480 | Disposition: A | Discharge status: Skilled Nursing Facility | Attending: Student | Admitting: Student

## 2023-10-31 ENCOUNTER — Other Ambulatory Visit: Payer: Self-pay

## 2023-10-31 DIAGNOSIS — E785 Hyperlipidemia, unspecified: Secondary | ICD-10-CM | POA: Diagnosis not present

## 2023-10-31 DIAGNOSIS — Y92003 Bedroom of unspecified non-institutional (private) residence as the place of occurrence of the external cause: Secondary | ICD-10-CM | POA: Diagnosis not present

## 2023-10-31 DIAGNOSIS — Z833 Family history of diabetes mellitus: Secondary | ICD-10-CM

## 2023-10-31 DIAGNOSIS — Z8711 Personal history of peptic ulcer disease: Secondary | ICD-10-CM

## 2023-10-31 DIAGNOSIS — D631 Anemia in chronic kidney disease: Secondary | ICD-10-CM | POA: Diagnosis present

## 2023-10-31 DIAGNOSIS — Z789 Other specified health status: Secondary | ICD-10-CM | POA: Diagnosis present

## 2023-10-31 DIAGNOSIS — Z91040 Latex allergy status: Secondary | ICD-10-CM

## 2023-10-31 DIAGNOSIS — M62562 Muscle wasting and atrophy, not elsewhere classified, left lower leg: Secondary | ICD-10-CM | POA: Diagnosis not present

## 2023-10-31 DIAGNOSIS — M25561 Pain in right knee: Secondary | ICD-10-CM | POA: Diagnosis present

## 2023-10-31 DIAGNOSIS — Z681 Body mass index (BMI) 19 or less, adult: Secondary | ICD-10-CM

## 2023-10-31 DIAGNOSIS — R2689 Other abnormalities of gait and mobility: Secondary | ICD-10-CM | POA: Diagnosis not present

## 2023-10-31 DIAGNOSIS — Z7401 Bed confinement status: Secondary | ICD-10-CM | POA: Diagnosis not present

## 2023-10-31 DIAGNOSIS — R531 Weakness: Secondary | ICD-10-CM | POA: Diagnosis not present

## 2023-10-31 DIAGNOSIS — M19011 Primary osteoarthritis, right shoulder: Secondary | ICD-10-CM | POA: Diagnosis not present

## 2023-10-31 DIAGNOSIS — M069 Rheumatoid arthritis, unspecified: Secondary | ICD-10-CM | POA: Diagnosis present

## 2023-10-31 DIAGNOSIS — N189 Chronic kidney disease, unspecified: Secondary | ICD-10-CM

## 2023-10-31 DIAGNOSIS — N2581 Secondary hyperparathyroidism of renal origin: Secondary | ICD-10-CM | POA: Diagnosis present

## 2023-10-31 DIAGNOSIS — E46 Unspecified protein-calorie malnutrition: Secondary | ICD-10-CM | POA: Diagnosis present

## 2023-10-31 DIAGNOSIS — R079 Chest pain, unspecified: Secondary | ICD-10-CM | POA: Diagnosis not present

## 2023-10-31 DIAGNOSIS — R5381 Other malaise: Secondary | ICD-10-CM | POA: Diagnosis present

## 2023-10-31 DIAGNOSIS — S0990XA Unspecified injury of head, initial encounter: Secondary | ICD-10-CM | POA: Diagnosis not present

## 2023-10-31 DIAGNOSIS — I953 Hypotension of hemodialysis: Secondary | ICD-10-CM | POA: Diagnosis not present

## 2023-10-31 DIAGNOSIS — E118 Type 2 diabetes mellitus with unspecified complications: Secondary | ICD-10-CM | POA: Diagnosis not present

## 2023-10-31 DIAGNOSIS — Z9181 History of falling: Secondary | ICD-10-CM | POA: Diagnosis not present

## 2023-10-31 DIAGNOSIS — M85851 Other specified disorders of bone density and structure, right thigh: Secondary | ICD-10-CM | POA: Diagnosis not present

## 2023-10-31 DIAGNOSIS — Z7409 Other reduced mobility: Secondary | ICD-10-CM | POA: Diagnosis not present

## 2023-10-31 DIAGNOSIS — Z87891 Personal history of nicotine dependence: Secondary | ICD-10-CM | POA: Diagnosis not present

## 2023-10-31 DIAGNOSIS — Z794 Long term (current) use of insulin: Secondary | ICD-10-CM

## 2023-10-31 DIAGNOSIS — Z992 Dependence on renal dialysis: Secondary | ICD-10-CM | POA: Diagnosis not present

## 2023-10-31 DIAGNOSIS — Z841 Family history of disorders of kidney and ureter: Secondary | ICD-10-CM

## 2023-10-31 DIAGNOSIS — M79604 Pain in right leg: Secondary | ICD-10-CM | POA: Diagnosis not present

## 2023-10-31 DIAGNOSIS — M25511 Pain in right shoulder: Secondary | ICD-10-CM | POA: Diagnosis not present

## 2023-10-31 DIAGNOSIS — E782 Mixed hyperlipidemia: Secondary | ICD-10-CM | POA: Diagnosis not present

## 2023-10-31 DIAGNOSIS — W06XXXA Fall from bed, initial encounter: Secondary | ICD-10-CM | POA: Diagnosis present

## 2023-10-31 DIAGNOSIS — S72001D Fracture of unspecified part of neck of right femur, subsequent encounter for closed fracture with routine healing: Secondary | ICD-10-CM | POA: Diagnosis not present

## 2023-10-31 DIAGNOSIS — Z741 Need for assistance with personal care: Secondary | ICD-10-CM | POA: Diagnosis not present

## 2023-10-31 DIAGNOSIS — Z79899 Other long term (current) drug therapy: Secondary | ICD-10-CM

## 2023-10-31 DIAGNOSIS — I447 Left bundle-branch block, unspecified: Secondary | ICD-10-CM | POA: Diagnosis present

## 2023-10-31 DIAGNOSIS — E119 Type 2 diabetes mellitus without complications: Secondary | ICD-10-CM | POA: Diagnosis not present

## 2023-10-31 DIAGNOSIS — K219 Gastro-esophageal reflux disease without esophagitis: Secondary | ICD-10-CM | POA: Diagnosis present

## 2023-10-31 DIAGNOSIS — E1140 Type 2 diabetes mellitus with diabetic neuropathy, unspecified: Secondary | ICD-10-CM | POA: Diagnosis not present

## 2023-10-31 DIAGNOSIS — I340 Nonrheumatic mitral (valve) insufficiency: Secondary | ICD-10-CM | POA: Diagnosis not present

## 2023-10-31 DIAGNOSIS — M6281 Muscle weakness (generalized): Secondary | ICD-10-CM | POA: Diagnosis not present

## 2023-10-31 DIAGNOSIS — E039 Hypothyroidism, unspecified: Secondary | ICD-10-CM | POA: Diagnosis not present

## 2023-10-31 DIAGNOSIS — Z811 Family history of alcohol abuse and dependence: Secondary | ICD-10-CM

## 2023-10-31 DIAGNOSIS — E876 Hypokalemia: Secondary | ICD-10-CM | POA: Diagnosis not present

## 2023-10-31 DIAGNOSIS — Z91041 Radiographic dye allergy status: Secondary | ICD-10-CM

## 2023-10-31 DIAGNOSIS — Z7982 Long term (current) use of aspirin: Secondary | ICD-10-CM | POA: Diagnosis not present

## 2023-10-31 DIAGNOSIS — S72141A Displaced intertrochanteric fracture of right femur, initial encounter for closed fracture: Secondary | ICD-10-CM | POA: Diagnosis not present

## 2023-10-31 DIAGNOSIS — N186 End stage renal disease: Secondary | ICD-10-CM | POA: Diagnosis not present

## 2023-10-31 DIAGNOSIS — S72101A Unspecified trochanteric fracture of right femur, initial encounter for closed fracture: Secondary | ICD-10-CM | POA: Diagnosis not present

## 2023-10-31 DIAGNOSIS — R6889 Other general symptoms and signs: Secondary | ICD-10-CM | POA: Diagnosis not present

## 2023-10-31 DIAGNOSIS — I120 Hypertensive chronic kidney disease with stage 5 chronic kidney disease or end stage renal disease: Secondary | ICD-10-CM | POA: Diagnosis present

## 2023-10-31 DIAGNOSIS — R54 Age-related physical debility: Secondary | ICD-10-CM | POA: Diagnosis present

## 2023-10-31 DIAGNOSIS — Z888 Allergy status to other drugs, medicaments and biological substances status: Secondary | ICD-10-CM

## 2023-10-31 DIAGNOSIS — R4189 Other symptoms and signs involving cognitive functions and awareness: Secondary | ICD-10-CM | POA: Diagnosis present

## 2023-10-31 DIAGNOSIS — I2510 Atherosclerotic heart disease of native coronary artery without angina pectoris: Secondary | ICD-10-CM | POA: Diagnosis present

## 2023-10-31 DIAGNOSIS — E1122 Type 2 diabetes mellitus with diabetic chronic kidney disease: Secondary | ICD-10-CM | POA: Diagnosis present

## 2023-10-31 DIAGNOSIS — R410 Disorientation, unspecified: Secondary | ICD-10-CM | POA: Diagnosis not present

## 2023-10-31 DIAGNOSIS — K5900 Constipation, unspecified: Secondary | ICD-10-CM | POA: Diagnosis not present

## 2023-10-31 DIAGNOSIS — Z7189 Other specified counseling: Secondary | ICD-10-CM | POA: Diagnosis not present

## 2023-10-31 DIAGNOSIS — I10 Essential (primary) hypertension: Secondary | ICD-10-CM | POA: Diagnosis not present

## 2023-10-31 DIAGNOSIS — M62561 Muscle wasting and atrophy, not elsewhere classified, right lower leg: Secondary | ICD-10-CM | POA: Diagnosis not present

## 2023-10-31 DIAGNOSIS — M810 Age-related osteoporosis without current pathological fracture: Secondary | ICD-10-CM | POA: Diagnosis present

## 2023-10-31 DIAGNOSIS — S72001A Fracture of unspecified part of neck of right femur, initial encounter for closed fracture: Principal | ICD-10-CM | POA: Diagnosis present

## 2023-10-31 DIAGNOSIS — S8991XA Unspecified injury of right lower leg, initial encounter: Secondary | ICD-10-CM | POA: Diagnosis not present

## 2023-10-31 DIAGNOSIS — Z043 Encounter for examination and observation following other accident: Secondary | ICD-10-CM | POA: Diagnosis not present

## 2023-10-31 DIAGNOSIS — Z743 Need for continuous supervision: Secondary | ICD-10-CM | POA: Diagnosis not present

## 2023-10-31 DIAGNOSIS — Z91013 Allergy to seafood: Secondary | ICD-10-CM

## 2023-10-31 DIAGNOSIS — D62 Acute posthemorrhagic anemia: Secondary | ICD-10-CM | POA: Diagnosis not present

## 2023-10-31 DIAGNOSIS — R0989 Other specified symptoms and signs involving the circulatory and respiratory systems: Secondary | ICD-10-CM | POA: Diagnosis not present

## 2023-10-31 DIAGNOSIS — S72101D Unspecified trochanteric fracture of right femur, subsequent encounter for closed fracture with routine healing: Secondary | ICD-10-CM | POA: Diagnosis not present

## 2023-10-31 DIAGNOSIS — Z8249 Family history of ischemic heart disease and other diseases of the circulatory system: Secondary | ICD-10-CM

## 2023-10-31 DIAGNOSIS — G8929 Other chronic pain: Secondary | ICD-10-CM | POA: Diagnosis not present

## 2023-10-31 DIAGNOSIS — B957 Other staphylococcus as the cause of diseases classified elsewhere: Secondary | ICD-10-CM | POA: Diagnosis not present

## 2023-10-31 HISTORY — DX: Dependence on renal dialysis: N18.6

## 2023-10-31 LAB — COMPREHENSIVE METABOLIC PANEL WITH GFR
ALT: 16 U/L (ref 0–44)
AST: 24 U/L (ref 15–41)
Albumin: 2.9 g/dL — ABNORMAL LOW (ref 3.5–5.0)
Alkaline Phosphatase: 64 U/L (ref 38–126)
Anion gap: 11 (ref 5–15)
BUN: 36 mg/dL — ABNORMAL HIGH (ref 8–23)
CO2: 24 mmol/L (ref 22–32)
Calcium: 8.9 mg/dL (ref 8.9–10.3)
Chloride: 100 mmol/L (ref 98–111)
Creatinine, Ser: 3.72 mg/dL — ABNORMAL HIGH (ref 0.44–1.00)
GFR, Estimated: 12 mL/min — ABNORMAL LOW (ref >60)
Glucose, Bld: 210 mg/dL — ABNORMAL HIGH (ref 70–99)
Potassium: 3.4 mmol/L — ABNORMAL LOW (ref 3.5–5.1)
Sodium: 135 mmol/L (ref 135–145)
Total Bilirubin: 0.9 mg/dL (ref 0.0–1.2)
Total Protein: 8 g/dL (ref 6.5–8.1)

## 2023-10-31 LAB — TYPE AND SCREEN
ABO/RH(D): O POS
Antibody Screen: NEGATIVE

## 2023-10-31 LAB — ABO/RH: ABO/RH(D): O POS

## 2023-10-31 LAB — CBC WITH DIFFERENTIAL/PLATELET
Abs Immature Granulocytes: 0.04 10*3/uL (ref 0.00–0.07)
Basophils Absolute: 0 10*3/uL (ref 0.0–0.1)
Basophils Relative: 0 %
Eosinophils Absolute: 0 10*3/uL (ref 0.0–0.5)
Eosinophils Relative: 0 %
HCT: 29.8 % — ABNORMAL LOW (ref 36.0–46.0)
Hemoglobin: 9.6 g/dL — ABNORMAL LOW (ref 12.0–15.0)
Immature Granulocytes: 0 %
Lymphocytes Relative: 9 %
Lymphs Abs: 0.8 10*3/uL (ref 0.7–4.0)
MCH: 29.6 pg (ref 26.0–34.0)
MCHC: 32.2 g/dL (ref 30.0–36.0)
MCV: 92 fL (ref 80.0–100.0)
Monocytes Absolute: 0.6 10*3/uL (ref 0.1–1.0)
Monocytes Relative: 6 %
Neutro Abs: 7.9 10*3/uL — ABNORMAL HIGH (ref 1.7–7.7)
Neutrophils Relative %: 85 %
Platelets: 168 10*3/uL (ref 150–400)
RBC: 3.24 MIL/uL — ABNORMAL LOW (ref 3.87–5.11)
RDW: 15.8 % — ABNORMAL HIGH (ref 11.5–15.5)
WBC: 9.3 10*3/uL (ref 4.0–10.5)
nRBC: 0 % (ref 0.0–0.2)

## 2023-10-31 MED ORDER — ATORVASTATIN CALCIUM 10 MG PO TABS
10.0000 mg | ORAL_TABLET | Freq: Every day | ORAL | Status: DC
  Administered 2023-10-31 – 2023-11-06 (×7): 10 mg via ORAL
  Filled 2023-10-31 (×8): qty 1

## 2023-10-31 MED ORDER — FENTANYL CITRATE PF 50 MCG/ML IJ SOSY
50.0000 ug | PREFILLED_SYRINGE | Freq: Once | INTRAMUSCULAR | Status: AC
  Administered 2023-10-31: 50 ug via INTRAVENOUS
  Filled 2023-10-31: qty 1

## 2023-10-31 MED ORDER — ACETAMINOPHEN 500 MG PO TABS
1000.0000 mg | ORAL_TABLET | Freq: Once | ORAL | Status: AC
  Administered 2023-11-01: 1000 mg via ORAL
  Filled 2023-10-31 (×2): qty 2

## 2023-10-31 MED ORDER — CARVEDILOL 6.25 MG PO TABS
6.2500 mg | ORAL_TABLET | Freq: Two times a day (BID) | ORAL | Status: DC
  Administered 2023-10-31 – 2023-11-06 (×11): 6.25 mg via ORAL
  Filled 2023-10-31 (×12): qty 1

## 2023-10-31 MED ORDER — PANTOPRAZOLE SODIUM 40 MG PO TBEC
40.0000 mg | DELAYED_RELEASE_TABLET | Freq: Two times a day (BID) | ORAL | Status: DC
  Administered 2023-10-31 – 2023-11-06 (×10): 40 mg via ORAL
  Filled 2023-10-31 (×12): qty 1

## 2023-10-31 MED ORDER — FENTANYL CITRATE PF 50 MCG/ML IJ SOSY
50.0000 ug | PREFILLED_SYRINGE | INTRAMUSCULAR | Status: DC | PRN
  Administered 2023-10-31 – 2023-11-01 (×5): 50 ug via INTRAVENOUS
  Filled 2023-10-31 (×5): qty 1

## 2023-10-31 MED ORDER — FOLIC ACID 1 MG PO TABS
1.0000 mg | ORAL_TABLET | Freq: Every day | ORAL | Status: DC
  Administered 2023-10-31 – 2023-11-06 (×7): 1 mg via ORAL
  Filled 2023-10-31 (×8): qty 1

## 2023-10-31 NOTE — ED Triage Notes (Addendum)
 Pt to ER via EMS form home.  Pt reports fall this AM around 2AM, was able to crawl back to BR and pull herself up on bed.  This AM they arrived to carry her to dialysis and she was unable to stand.  PT has noted deformity to right hip.  PMS intact, pt was given 100mcg Fentanyl  enroute.  Pt reduced from 8/10 to 4/10.

## 2023-10-31 NOTE — Hospital Course (Addendum)
 78 year old female with a history of hypertension, diabetes mellitus type 2, ESRD (TTS), hyperlipidemia, and GERD presenting after mechanical fall.  The patient was getting up out of bed to get something to eat because her continuous glucose monitor was alarming her that her sugar was low.  The patient fell on the way to the kitchen.  She denied any head injury or loss of consciousness.  Her family got her back into the bedroom.  Unfortunately, she continued to have pain on the morning of 10/31/2023.  She was unable to bear weight.  As result, she was brought to the emergency department for further evaluation and treatment. The patient was last dialyzed on 10/29/2023.  The patient had been in her usual state of health without any complaints prior to her fall.  Patient denies fevers, chills, headache, chest pain, dyspnea, nausea, vomiting, diarrhea, abdominal pain, dysuria, hematuria, hematochezia, and melena. At baseline, the patient ambulates with a cane and walker.  She is able to perform her ADLs with minimal assistance. In the ED, the patient was afebrile hemodynamically stable with oxygen saturation 99% on room air.  WBC 9.3, hemoglobin 9.6, platelets 168.  LFTs were unremarkable.  BUN 36, creatinine 3.32.  CT of the brain was negative for acute findings.  X-ray of the hip showed severe angulated fracture of the right proximal femur.  Orthopedics, Dr. Curtiss Dowdy consulted and pt will transfer to [facility] for ORIF on 11/01/23. Nephrology was also consulted for maintenance dialysis.

## 2023-10-31 NOTE — H&P (Signed)
 History and Physical    Patient: Erica Crosby ZOX:[id number] DOB: 1945/09/16 DOA: 10/31/2023 DOS: the patient was seen and examined on 10/31/2023 PCP: Eliodoro Guerin, DO  Patient coming from: Home  Chief Complaint:  Chief Complaint  Patient presents with   Fall   Hip Pain   HPI: Erica Crosby is a 78 year old female with a history of hypertension, diabetes mellitus type 2, ESRD (TTS), hyperlipidemia, and GERD presenting after mechanical fall.  The patient was getting up out of bed to get something to eat because her continuous glucose monitor was alarming her that her sugar was low.  The patient fell on the way to the kitchen.  She denied any head injury or loss of consciousness.  Her family got her back into the bedroom.  Unfortunately, she continued to have pain on the morning of 10/31/2023.  She was unable to bear weight.  As result, she was brought to the emergency department for further evaluation and treatment. The patient was last dialyzed on 10/29/2023.  The patient had been in her usual state of health without any complaints prior to her fall.  Patient denies fevers, chills, headache, chest pain, dyspnea, nausea, vomiting, diarrhea, abdominal pain, dysuria, hematuria, hematochezia, and melena. At baseline, the patient ambulates with a cane and walker.  She is able to perform her ADLs with minimal assistance. In the ED, the patient was afebrile hemodynamically stable with oxygen saturation 99% on room air.  WBC 9.3, hemoglobin 9.6, platelets 168.  LFTs were unremarkable.  BUN 36, creatinine 3.32.  CT of the brain was negative for acute findings.  X-ray of the hip showed severe angulated fracture of the right proximal femur.  Orthopedics, Dr. Curtiss Dowdy consulted and pt will transfer to [facility] for ORIF on 11/01/23. Nephrology was also consulted for maintenance dialysis.  Review of Systems: As mentioned in the history of present illness. All other systems reviewed and are negative. Past  Medical History:  Diagnosis Date   Anemia    Arthritis    Cataract    Coronary artery disease    Mild plaque 2012   Diabetes mellitus    Essential hypertension 02/10/2012   GERD (gastroesophageal reflux disease)    Hyperlipidemia    Hypertension    Neuropathy    Peptic ulcer    Rheumatoid arthritis (HCC)    Syncope 02/10/2012   Past Surgical History:  Procedure Laterality Date   ABSCESS DRAINAGE     AV FISTULA PLACEMENT Left 09/04/2023   Procedure: ARTERIOVENOUS (AV) FISTULA CREATION, BRACHIOCEPHALIC;  Surgeon: Margherita Shell, MD;  Location: [facility] OR;  Service: Vascular;  Laterality: Left;   CARPAL TUNNEL RELEASE Left    CHOLECYSTECTOMY     INSERTION OF DIALYSIS CATHETER Right 09/04/2023   Procedure: INSERTION OF DIALYSIS CATHETER USING 23CM PALINDROME CATHETER;  Surgeon: Margherita Shell, MD;  Location: [facility] OR;  Service: Vascular;  Laterality: Right;   KNEE SURGERY Right    NECK SURGERY     x 2   SHOULDER SURGERY Left    TEE WITHOUT CARDIOVERSION N/A 03/12/2022   Procedure: TRANSESOPHAGEAL ECHOCARDIOGRAM (TEE);  Surgeon: Luana Rumple, MD;  Location: [facility] ENDOSCOPY;  Service: Cardiovascular;  Laterality: N/A;   Social History:  reports that she quit smoking about 10 years ago. Her smoking use included cigarettes and e-cigarettes. She started smoking about 35 years ago. She has never used smokeless tobacco. She reports that she does not drink alcohol and does not use drugs.  Allergies  Allergen  Reactions   Iodinated Contrast Media     Unknown reaction    Latex     Unknown reaction    Lisinopril Swelling    lips   Shellfish Allergy     Unknown Reaction     Family History  Problem Relation Age of Onset   Heart disease Sister        Congeital (died age 36) with "enlarged heart"   CAD Sister    Diabetes Mother    Heart disease Mother        Later onset heart disease    Dementia Mother    Alcohol abuse Father    Liver disease Father    CAD Sister 47       CABG   Early  death Sister    Diabetes Brother    Kidney disease Brother        related to DM   Diabetes Brother     Prior to Admission medications   Medication Sig Start Date End Date Taking? Authorizing Provider  acetaminophen  (TYLENOL ) 500 MG tablet Take 500 mg by mouth every 6 (six) hours as needed for moderate pain (pain score 4-6).   Yes [provider]  aspirin  EC 81 MG tablet Take 81 mg by mouth daily.   Yes [provider]  atorvastatin  (LIPITOR) 10 MG tablet Take 1 tablet (10 mg total) by mouth daily. 01/16/23  Yes Gottschalk, Sharolyn Decant M, DO  carvedilol  (COREG ) 6.25 MG tablet Take 6.25 mg by mouth 2 (two) times daily. 06/25/23  Yes [provider]  Continuous Glucose Sensor ([name] 2 SENSOR) MISC USE TO TEST BLOOD SUGAR CONTINUOUSLY. Dx: E11.65. ADVANCED DIABETES SUPPLY VIA PARACHUTE 01/14/23  Yes Gottschalk, Ashly M, DO  folic acid  (FOLVITE ) 1 MG tablet Take 1 tablet (1 mg total) by mouth daily. 01/16/23  Yes Vicky Grange M, DO  insulin  glargine (LANTUS  SOLOSTAR) 100 UNIT/ML Solostar Pen Inject 5-10 Units into the skin at bedtime. Patient taking differently: Inject 5-10 Units into the skin daily as needed. 09/24/23  Yes Gottschalk, Sharolyn Decant M, DO  Omega-3 Fatty Acids (FISH OIL) 1000 MG CAPS Take 1,000 mg by mouth daily.   Yes [provider]  pantoprazole  (PROTONIX ) 40 MG tablet TAKE ONE TABLET DAILY 08/08/23  Yes Vicky Grange M, DO  amLODipine  (NORVASC ) 2.5 MG tablet Take 1 tablet (2.5 mg total) by mouth daily. Patient not taking: Reported on 10/31/2023 01/16/23   Eliodoro Guerin, DO  Cholecalciferol  (VITAMIN D -3) 125 MCG (5000 UT) TABS Take 5,000 Units by mouth daily. Patient not taking: Reported on 10/31/2023    [provider]    Physical Exam: Vitals:   10/31/23 1245 10/31/23 1300 10/31/23 1315 10/31/23 1330  BP: (!) 166/65 (!) 171/67 (!) 168/65 (!) 171/66  Pulse: 93 92 92 94  Resp: 11 12 13 10   Temp:      SpO2: 98% 96% 97% 98%   Weight:      Height:       GENERAL:  A&O x 3, NAD, well developed, cooperative, follows commands HEENT: [place]/AT, No thrush, No icterus, No oral ulcers Neck:  No neck mass, No meningismus, soft, supple CV: RRR, no S3, no S4, no rub, no JVD Lungs:  CTA, no wheeze, no rhonchi, good air movement Abd: soft/NT +BS, nondistended Ext: No edema, no lymphangitis, no cyanosis, no rashes Neuro:  CN II-XII intact, strength 4/5 in RUE, RLE, strength 4/5 LUE, LLE; sensation intact bilateral; no dysmetria; babinski equivocal  Data Reviewed:  Data reviewed above in history  Assessment and Plan: Closed right proximal femur fracture -Orthopedic surgery consulted--Dr. Haddix -npo after MN -plan for surgery 11/01/23 -PT/OT after surgery  ESRD - Nephrology consulted for maintenance dialysis - She dialyzes TTS - Last dialysis was on 10/29/2023  Essential hypertension - Continue carvedilol   Mixed hyperlipidemia - Continue Lipitor and fish oil  Diabetes mellitus type 2, controlled - The patient and daughter give history of at least 3 nights per week of having low sugars - Discontinue long-acting insulin  for now - NovoLog  sliding scale - Monitor CBGs - 12-24 hemoglobin A1c 6.3  GERD - Continue pantoprazole     Advance Care Planning: FULL  Consults: renal, ortho  Family Communication: daughter updated 10/31/23  Severity of Illness: The appropriate patient status for this patient is INPATIENT. Inpatient status is judged to be reasonable and necessary in order to provide the required intensity of service to ensure the patient's safety. The patient's presenting symptoms, physical exam findings, and initial radiographic and laboratory data in the context of their chronic comorbidities is felt to place them at high risk for further clinical deterioration. Furthermore, it is not anticipated that the patient will be medically stable for discharge from the hospital within 2 midnights of admission.   * I  certify that at the point of admission it is my clinical judgment that the patient will require inpatient hospital care spanning beyond 2 midnights from the point of admission due to high intensity of service, high risk for further deterioration and high frequency of surveillance required.*  Author: Demaris Fillers, MD 10/31/2023 2:03 PM  For on call review [email or web address].

## 2023-10-31 NOTE — Progress Notes (Signed)
 Informed of consult. Patient presented to [facility] initially for fall/hip pain, found to have closed right prox femur fracture. Transferred to [facility] with plan for OR tomorrow. Patient is ESRD, on HD TTS. Outpatient HD orders: [facility]. TTS. F180 3.5HRS. EDW 45.5kg. TDC, flow rates: 300/500. 2k/2cal. Heparin : 1,500 units bolus. Meds: Mircera 30mcg every 2 weeks, venofer 50mg  qweekly.  Plan: -last treatment 5/13 Tues. No acute needs for RRT today based on labs and she is on RA -will assess tomorrow for any acute needs for RRT otherwise can plan for HD on her scheduled day on Saturday -will be seen tomorrow, consult to follow then -please call with any questions/concerns in the interim  Cristi Donalds, MD [facility]

## 2023-10-31 NOTE — Progress Notes (Signed)
 Patient ID: Erica Crosby, female   DOB: Nov 06, 1945, 78 y.o.   MRN: [id number]  Consult received and x-rays reviewed. Plan for IMN tomorrow at [facility] with Dr. Curtiss Dowdy. Please keep NPO after MN.    Georganna Kin, PA-C Orthopedic Surgery [phone]

## 2023-10-31 NOTE — ED Provider Notes (Signed)
 [place] EMERGENCY DEPARTMENT AT [facility] Provider Note   CSN: [id number] Arrival date & time: 10/31/23  6578     History  Chief Complaint  Patient presents with   Fall   Hip Pain    Erica Crosby is a 78 y.o. female.  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  Presents to the ER with right hip pain and deformity after fall last night.  She was trying to walk to get something to eat because her blood sugar dropped and she unfortunately fell on her right side, denies head injury or LOC.  She was able to get her but self back to her bedroom, got something to eat to get her blood sugar up.  Little walk due to hip pain and deformity.  No history of hip fracture or dislocation.   Fall  Hip Pain       Home Medications Prior to Admission medications   Medication Sig Start Date End Date Taking? Authorizing Provider  acetaminophen  (TYLENOL ) 500 MG tablet Take 500 mg by mouth every 6 (six) hours as needed for moderate pain (pain score 4-6).    [provider]  amLODipine  (NORVASC ) 2.5 MG tablet Take 1 tablet (2.5 mg total) by mouth daily. 01/16/23   Eliodoro Guerin, DO  aspirin  EC 81 MG tablet Take 81 mg by mouth daily.    [provider]  atorvastatin  (LIPITOR) 10 MG tablet Take 1 tablet (10 mg total) by mouth daily. 01/16/23   Eliodoro Guerin, DO  carvedilol  (COREG ) 6.25 MG tablet Take 6.25 mg by mouth 2 (two) times daily. Patient not taking: Reported on 10/21/2023 06/25/23   [provider]  Cholecalciferol  (VITAMIN D -3) 125 MCG (5000 UT) TABS Take 5,000 Units by mouth daily.    [provider]  Continuous Glucose Sensor ([name] 2 SENSOR) MISC USE TO TEST BLOOD SUGAR CONTINUOUSLY. Dx: E11.65. [employer]VANCED DIABET[employer] SUPPLY VIA [employer] 01/14/23   Vicky Grange M, DO  folic acid  (FOLVITE ) 1 MG tablet Take 1 tablet (1 mg total) by mouth daily. 01/16/23   Eliodoro Guerin, DO  haloperidol   (HALDOL ) 1 MG tablet Take 1 tablet (1 mg total) by mouth every 6 (six) hours as needed for agitation. 08/09/23   Mandy Second, PA-C  insulin  glargine (LANTUS  SOLOSTAR) 100 UNIT/ML Solostar Pen Inject 5-10 Units into the skin at bedtime. 09/24/23   Eliodoro Guerin, DO  Omega-3 Fatty Acids (FISH OIL) 1000 MG CAPS Take 1,000 mg by mouth daily.    [provider]  oxyCODONE -acetaminophen  (PERCOCET) 5-325 MG tablet Take 1 tablet by mouth every 6 (six) hours as needed for severe pain (pain score 7-10). 09/04/23   Rhyne, Samantha J, PA-C  pantoprazole  (PROTONIX ) 40 MG tablet TAKE ONE TABLET DAILY 08/08/23   Vicky Grange M, DO      Allergies    Iodinated contrast media, Latex, Lisinopril, and Shellfish allergy    Review of Systems   Review of Systems  Physical Exam Updated Vital Signs BP (!) 179/67   Pulse 87   Temp 98.5 F (36.9 C)   Resp 18   Ht 5\' 6"  (1.676 m)   Wt 46 kg   SpO2 95%   BMI 16.37 kg/m  Physical Exam Vitals and nursing note reviewed.  Constitutional:      General: She is not in acute distress.    Appearance: She is well-developed.  HENT:     Head: Normocephalic and  atraumatic.     Mouth/Throat:     Mouth: Mucous membranes are moist.  Eyes:     Extraocular Movements: Extraocular movements intact.     Conjunctiva/sclera: Conjunctivae normal.     Pupils: Pupils are equal, round, and reactive to light.  Cardiovascular:     Rate and Rhythm: Normal rate and regular rhythm.     Heart sounds: No murmur heard. Pulmonary:     Effort: Pulmonary effort is normal. No respiratory distress.     Breath sounds: Normal breath sounds.  Abdominal:     Palpations: Abdomen is soft.     Tenderness: There is no abdominal tenderness.  Musculoskeletal:        General: No swelling.     Cervical back: Neck supple.     Comments: Deformity noted to right hip, no tenderness to right knee or ankle.  Right leg is shortened and externally rotated.  DP and PT pulses in right  foot are intact.  Right foot is warm and well-perfused.  Patient can move toes without difficulty.  Skin:    General: Skin is warm and dry.     Capillary Refill: Capillary refill takes less than 2 seconds.  Neurological:     General: No focal deficit present.     Mental Status: She is alert and oriented to person, place, and time.  Psychiatric:        Mood and Affect: Mood normal.     ED Results / Procedures / Treatments   Labs (all labs ordered are listed, but only abnormal results are displayed) Labs Reviewed - No data to display  EKG None  Radiology No results found.  Procedures Procedures    Medications Ordered in ED Medications - No data to display  ED Course/ Medical Decision Making/ A&P Clinical Course as of 10/31/23 1433  Thu Oct 31, 2023  1312 Severely angulated right intertrochanteric fracture.  Discussed with Dr. Phyllis Breeze who states due to patient's dialysis access issues, he would like her to go to [facility]. [CB]    Clinical Course User Index [CB] Aimee Houseman, PA-C                                 Medical Decision Making This patient presents to the ED for concern of right hip pain, this involves an extensive number of treatment options, and is a complaint that carries with it a high risk of complications and morbidity.  The differential diagnosis includes fracture, dislocation, contusion, other   Co morbidities that complicate the patient evaluation :   ESRD on dialysis, diabetes   Additional history obtained:  Additional history obtained from EMR External records from outside source obtained and reviewed including prior notes and labs   Lab Tests:  I Ordered, and personally interpreted labs.  The pertinent results include: CBC-hemoglobin 9.6, no leukocytosis, CMP-glucose 210, BUN 36, creatinine 3.72, electrolytes are pending   Imaging Studies ordered:  I ordered imaging studies including x-ray right hip which shows severely angulated  right intratrochanteric fracture CT head shows no fracture of the skull, no intracranial hemorrhage X-ray right shoulder shows no fracture or dislocation Chest x-ray shows no pulmonary edema or infiltrate I independently visualized and interpreted imaging within scope of identifying emergent findings  I agree with the radiologist interpretation   Cardiac Monitoring: / EKG:  The patient was maintained on a cardiac monitor.  I personally viewed and interpreted the cardiac  monitored which showed an underlying rhythm of: Sinus rhythm, QTc prolonged at 508   Consultations Obtained:  I requested consultation with the orthopedic surgeon Dr. 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 after midnight and plan for intramedullary nail tomorrow   Problem List / ED Course / Critical interventions / Medication management  Patient had fall of bed, lost her balance due to her sugar being low.  Was able to get to eat some food to bring her sugar up but had deformity to the leg, pain got worse throughout the night so she presents to the ER via EMS with obvious deformity to the right hip, x-ray shows severely angulated right peritrochanteric fracture.  Patient will be admitted to hospitalist and transferred to [facility] with plan for surgery tomorrow I ordered medication including Fentanyl   for pain  Reevaluation of the patient after these medicines showed that the patient improved I have reviewed the patients home medicines and have made adjustments as needed   Social Determinants of Health:  Lives with family      Amount and/or Complexity of Data Reviewed Labs:  ordered. Radiology: ordered.  Risk Prescription drug management. Decision regarding hospitalization.           Final Clinical Impression(s) / ED Diagnoses Final diagnoses:  None    Rx / DC Orders ED Discharge Orders     None         Aimee Houseman, PA-C 10/31/23 1440    Deatra Face, MD 10/31/23 1726

## 2023-11-01 ENCOUNTER — Encounter (HOSPITAL_COMMUNITY): Payer: Self-pay | Admitting: Internal Medicine

## 2023-11-01 ENCOUNTER — Inpatient Hospital Stay (HOSPITAL_COMMUNITY)

## 2023-11-01 ENCOUNTER — Inpatient Hospital Stay (HOSPITAL_COMMUNITY): Admitting: Anesthesiology

## 2023-11-01 ENCOUNTER — Encounter (HOSPITAL_COMMUNITY)
Admission: EM | Disposition: A | Discharge status: Skilled Nursing Facility | Payer: Self-pay | Source: Home / Self Care | Attending: Student

## 2023-11-01 DIAGNOSIS — S72141A Displaced intertrochanteric fracture of right femur, initial encounter for closed fracture: Secondary | ICD-10-CM

## 2023-11-01 DIAGNOSIS — I10 Essential (primary) hypertension: Secondary | ICD-10-CM | POA: Diagnosis not present

## 2023-11-01 DIAGNOSIS — R5381 Other malaise: Secondary | ICD-10-CM

## 2023-11-01 DIAGNOSIS — I2510 Atherosclerotic heart disease of native coronary artery without angina pectoris: Secondary | ICD-10-CM

## 2023-11-01 DIAGNOSIS — E1140 Type 2 diabetes mellitus with diabetic neuropathy, unspecified: Secondary | ICD-10-CM

## 2023-11-01 DIAGNOSIS — E46 Unspecified protein-calorie malnutrition: Secondary | ICD-10-CM

## 2023-11-01 DIAGNOSIS — E039 Hypothyroidism, unspecified: Secondary | ICD-10-CM | POA: Diagnosis not present

## 2023-11-01 DIAGNOSIS — Z87891 Personal history of nicotine dependence: Secondary | ICD-10-CM | POA: Diagnosis not present

## 2023-11-01 DIAGNOSIS — M810 Age-related osteoporosis without current pathological fracture: Secondary | ICD-10-CM

## 2023-11-01 DIAGNOSIS — N186 End stage renal disease: Secondary | ICD-10-CM | POA: Diagnosis not present

## 2023-11-01 DIAGNOSIS — K219 Gastro-esophageal reflux disease without esophagitis: Secondary | ICD-10-CM

## 2023-11-01 DIAGNOSIS — S72001A Fracture of unspecified part of neck of right femur, initial encounter for closed fracture: Secondary | ICD-10-CM | POA: Diagnosis not present

## 2023-11-01 HISTORY — PX: INTRAMEDULLARY (IM) NAIL INTERTROCHANTERIC: SHX5875

## 2023-11-01 LAB — RENAL FUNCTION PANEL
Albumin: 2.7 g/dL — ABNORMAL LOW (ref 3.5–5.0)
Anion gap: 16 — ABNORMAL HIGH (ref 5–15)
BUN: 48 mg/dL — ABNORMAL HIGH (ref 8–23)
CO2: 21 mmol/L — ABNORMAL LOW (ref 22–32)
Calcium: 8.8 mg/dL — ABNORMAL LOW (ref 8.9–10.3)
Chloride: 103 mmol/L (ref 98–111)
Creatinine, Ser: 4.49 mg/dL — ABNORMAL HIGH (ref 0.44–1.00)
GFR, Estimated: 10 mL/min — ABNORMAL LOW (ref >60)
Glucose, Bld: 145 mg/dL — ABNORMAL HIGH (ref 70–99)
Phosphorus: 5.6 mg/dL — ABNORMAL HIGH (ref 2.5–4.6)
Potassium: 3.8 mmol/L (ref 3.5–5.1)
Sodium: 140 mmol/L (ref 135–145)

## 2023-11-01 LAB — CBC
HCT: 28.9 % — ABNORMAL LOW (ref 36.0–46.0)
Hemoglobin: 9.5 g/dL — ABNORMAL LOW (ref 12.0–15.0)
MCH: 29.8 pg (ref 26.0–34.0)
MCHC: 32.9 g/dL (ref 30.0–36.0)
MCV: 90.6 fL (ref 80.0–100.0)
Platelets: 181 10*3/uL (ref 150–400)
RBC: 3.19 MIL/uL — ABNORMAL LOW (ref 3.87–5.11)
RDW: 16.1 % — ABNORMAL HIGH (ref 11.5–15.5)
WBC: 7.4 10*3/uL (ref 4.0–10.5)
nRBC: 0 % (ref 0.0–0.2)

## 2023-11-01 LAB — POCT I-STAT, CHEM 8
BUN: 44 mg/dL — ABNORMAL HIGH (ref 8–23)
Calcium, Ion: 1.12 mmol/L — ABNORMAL LOW (ref 1.15–1.40)
Chloride: 102 mmol/L (ref 98–111)
Creatinine, Ser: 4.5 mg/dL — ABNORMAL HIGH (ref 0.44–1.00)
Glucose, Bld: 127 mg/dL — ABNORMAL HIGH (ref 70–99)
HCT: 29 % — ABNORMAL LOW (ref 36.0–46.0)
Hemoglobin: 9.9 g/dL — ABNORMAL LOW (ref 12.0–15.0)
Potassium: 3.8 mmol/L (ref 3.5–5.1)
Sodium: 140 mmol/L (ref 135–145)
TCO2: 25 mmol/L (ref 22–32)

## 2023-11-01 LAB — HEMOGLOBIN A1C
Hgb A1c MFr Bld: 5.7 % — ABNORMAL HIGH (ref 4.8–5.6)
Mean Plasma Glucose: 116.89 mg/dL

## 2023-11-01 LAB — SURGICAL PCR SCREEN
MRSA, PCR: NEGATIVE
Staphylococcus aureus: POSITIVE — AB

## 2023-11-01 LAB — GLUCOSE, CAPILLARY
Glucose-Capillary: 120 mg/dL — ABNORMAL HIGH (ref 70–99)
Glucose-Capillary: 123 mg/dL — ABNORMAL HIGH (ref 70–99)
Glucose-Capillary: 128 mg/dL — ABNORMAL HIGH (ref 70–99)
Glucose-Capillary: 136 mg/dL — ABNORMAL HIGH (ref 70–99)
Glucose-Capillary: 183 mg/dL — ABNORMAL HIGH (ref 70–99)
Glucose-Capillary: 206 mg/dL — ABNORMAL HIGH (ref 70–99)

## 2023-11-01 LAB — TSH: TSH: 0.56 u[IU]/mL (ref 0.350–4.500)

## 2023-11-01 LAB — VITAMIN D 25 HYDROXY (VIT D DEFICIENCY, FRACTURES): Vit D, 25-Hydroxy: 92.61 ng/mL (ref 30–100)

## 2023-11-01 SURGERY — FIXATION, FRACTURE, INTERTROCHANTERIC, WITH INTRAMEDULLARY ROD
Anesthesia: General | Laterality: Right

## 2023-11-01 MED ORDER — MUPIROCIN 2 % EX OINT
1.0000 | TOPICAL_OINTMENT | Freq: Two times a day (BID) | CUTANEOUS | 0 refills | Status: AC
Start: 2023-11-01 — End: 2023-12-01

## 2023-11-01 MED ORDER — CHLORHEXIDINE GLUCONATE 4 % EX SOLN: 1.0000 | CUTANEOUS | 1 refills | Status: DC

## 2023-11-01 MED ORDER — METHOCARBAMOL 1000 MG/10ML IJ SOLN: 500.0000 mg | Freq: Four times a day (QID) | INTRAMUSCULAR | Status: DC | PRN

## 2023-11-01 MED ORDER — MORPHINE SULFATE (PF) 2 MG/ML IV SOLN: 0.5000 mg | INTRAVENOUS | Status: DC | PRN

## 2023-11-01 MED ORDER — FENTANYL CITRATE (PF) 100 MCG/2ML IJ SOLN: 25.0000 ug | INTRAMUSCULAR | Status: DC | PRN

## 2023-11-01 MED ORDER — ONDANSETRON HCL 4 MG PO TABS: 4.0000 mg | ORAL_TABLET | Freq: Four times a day (QID) | ORAL | Status: DC | PRN

## 2023-11-01 MED ORDER — MENTHOL 3 MG MT LOZG: 1.0000 | LOZENGE | OROMUCOSAL | Status: DC | PRN

## 2023-11-01 MED ORDER — PHENYLEPHRINE 80 MCG/ML (10ML) SYRINGE FOR IV PUSH (FOR BLOOD PRESSURE SUPPORT)
PREFILLED_SYRINGE | INTRAVENOUS | Status: AC
  Filled 2023-11-01: qty 20

## 2023-11-01 MED ORDER — DOCUSATE SODIUM 100 MG PO CAPS
100.0000 mg | ORAL_CAPSULE | Freq: Two times a day (BID) | ORAL | Status: DC
  Administered 2023-11-01 – 2023-11-06 (×10): 100 mg via ORAL
  Filled 2023-11-01 (×11): qty 1

## 2023-11-01 MED ORDER — PROPOFOL 10 MG/ML IV BOLUS
INTRAVENOUS | Status: DC | PRN
  Administered 2023-11-01: 80 mg via INTRAVENOUS

## 2023-11-01 MED ORDER — ONDANSETRON HCL 4 MG/2ML IJ SOLN
INTRAMUSCULAR | Status: DC | PRN
  Administered 2023-11-01: 4 mg via INTRAVENOUS

## 2023-11-01 MED ORDER — MIDAZOLAM HCL 2 MG/2ML IJ SOLN
INTRAMUSCULAR | Status: AC
  Filled 2023-11-01: qty 2

## 2023-11-01 MED ORDER — DEXAMETHASONE SODIUM PHOSPHATE 10 MG/ML IJ SOLN
INTRAMUSCULAR | Status: AC
  Filled 2023-11-01: qty 1

## 2023-11-01 MED ORDER — CHLORHEXIDINE GLUCONATE 4 % EX SOLN
60.0000 mL | Freq: Once | CUTANEOUS | Status: AC
  Administered 2023-11-01: 4 via TOPICAL

## 2023-11-01 MED ORDER — ASPIRIN 325 MG PO TBEC
325.0000 mg | DELAYED_RELEASE_TABLET | Freq: Every day | ORAL | Status: DC
  Administered 2023-11-02 – 2023-11-06 (×5): 325 mg via ORAL
  Filled 2023-11-01 (×5): qty 1

## 2023-11-01 MED ORDER — ROCURONIUM BROMIDE 10 MG/ML (PF) SYRINGE
PREFILLED_SYRINGE | INTRAVENOUS | Status: AC
  Filled 2023-11-01: qty 10

## 2023-11-01 MED ORDER — PHENOL 1.4 % MT LIQD: 1.0000 | OROMUCOSAL | Status: DC | PRN

## 2023-11-01 MED ORDER — CHLORHEXIDINE GLUCONATE 0.12 % MT SOLN: 15.0000 mL | Freq: Once | OROMUCOSAL | Status: AC

## 2023-11-01 MED ORDER — HEPARIN SODIUM (PORCINE) 5000 UNIT/ML IJ SOLN: 5000.0000 [IU] | Freq: Three times a day (TID) | INTRAMUSCULAR | Status: DC

## 2023-11-01 MED ORDER — TRANEXAMIC ACID-NACL 1000-0.7 MG/100ML-% IV SOLN
1000.0000 mg | Freq: Once | INTRAVENOUS | Status: AC
  Administered 2023-11-01: 1000 mg via INTRAVENOUS
  Filled 2023-11-01: qty 100

## 2023-11-01 MED ORDER — CEFAZOLIN SODIUM-DEXTROSE 2-4 GM/100ML-% IV SOLN: 2.0000 g | Freq: Three times a day (TID) | INTRAVENOUS | Status: DC

## 2023-11-01 MED ORDER — SUGAMMADEX SODIUM 200 MG/2ML IV SOLN
INTRAVENOUS | Status: DC | PRN
  Administered 2023-11-01: 200 mg via INTRAVENOUS

## 2023-11-01 MED ORDER — CHLORHEXIDINE GLUCONATE 0.12 % MT SOLN
OROMUCOSAL | Status: AC
  Filled 2023-11-01: qty 15

## 2023-11-01 MED ORDER — METOCLOPRAMIDE HCL 5 MG PO TABS: 5.0000 mg | ORAL_TABLET | Freq: Three times a day (TID) | ORAL | Status: DC | PRN

## 2023-11-01 MED ORDER — SODIUM CHLORIDE 0.9 % IV SOLN: INTRAVENOUS | Status: AC

## 2023-11-01 MED ORDER — ONDANSETRON HCL 4 MG/2ML IJ SOLN: 4.0000 mg | Freq: Once | INTRAMUSCULAR | Status: DC | PRN

## 2023-11-01 MED ORDER — METHOCARBAMOL 500 MG PO TABS: 500.0000 mg | ORAL_TABLET | Freq: Four times a day (QID) | ORAL | Status: DC | PRN

## 2023-11-01 MED ORDER — TRANEXAMIC ACID-NACL 1000-0.7 MG/100ML-% IV SOLN
1000.0000 mg | Freq: Once | INTRAVENOUS | Status: DC
  Filled 2023-11-01: qty 100

## 2023-11-01 MED ORDER — HYDROCODONE-ACETAMINOPHEN 5-325 MG PO TABS
1.0000 | ORAL_TABLET | Freq: Four times a day (QID) | ORAL | Status: DC | PRN
  Administered 2023-11-01: 2 via ORAL
  Administered 2023-11-02: 1 via ORAL
  Administered 2023-11-02: 2 via ORAL
  Filled 2023-11-01: qty 1
  Filled 2023-11-01 (×2): qty 2

## 2023-11-01 MED ORDER — LIDOCAINE 2% (20 MG/ML) 5 ML SYRINGE
INTRAMUSCULAR | Status: AC
  Filled 2023-11-01: qty 5

## 2023-11-01 MED ORDER — SENNOSIDES-DOCUSATE SODIUM 8.6-50 MG PO TABS: 1.0000 | ORAL_TABLET | Freq: Every evening | ORAL | Status: DC | PRN

## 2023-11-01 MED ORDER — CHLORHEXIDINE GLUCONATE CLOTH 2 % EX PADS
6.0000 | MEDICATED_PAD | Freq: Every day | CUTANEOUS | Status: DC
  Administered 2023-11-02 – 2023-11-06 (×4): 6 via TOPICAL

## 2023-11-01 MED ORDER — MUPIROCIN 2 % EX OINT
TOPICAL_OINTMENT | CUTANEOUS | Status: AC
  Administered 2023-11-01: 1 via TOPICAL
  Filled 2023-11-01: qty 22

## 2023-11-01 MED ORDER — INSULIN ASPART 100 UNIT/ML IJ SOLN
0.0000 [IU] | INTRAMUSCULAR | Status: DC
  Administered 2023-11-01: 1 [IU] via SUBCUTANEOUS
  Administered 2023-11-02: 2 [IU] via SUBCUTANEOUS
  Administered 2023-11-02 – 2023-11-03 (×4): 1 [IU] via SUBCUTANEOUS
  Administered 2023-11-04: 2 [IU] via SUBCUTANEOUS
  Administered 2023-11-05 – 2023-11-06 (×2): 1 [IU] via SUBCUTANEOUS

## 2023-11-01 MED ORDER — MUPIROCIN 2 % EX OINT: 1.0000 | TOPICAL_OINTMENT | Freq: Once | CUTANEOUS | Status: AC

## 2023-11-01 MED ORDER — METOCLOPRAMIDE HCL 5 MG/ML IJ SOLN: 5.0000 mg | Freq: Three times a day (TID) | INTRAMUSCULAR | Status: DC | PRN

## 2023-11-01 MED ORDER — CEFAZOLIN SODIUM-DEXTROSE 2-4 GM/100ML-% IV SOLN
2.0000 g | INTRAVENOUS | Status: AC
  Administered 2023-11-01: 2 g via INTRAVENOUS
  Filled 2023-11-01 (×2): qty 100

## 2023-11-01 MED ORDER — DEXAMETHASONE SODIUM PHOSPHATE 10 MG/ML IJ SOLN
INTRAMUSCULAR | Status: DC | PRN
  Administered 2023-11-01: 10 mg via INTRAVENOUS

## 2023-11-01 MED ORDER — LIDOCAINE 2% (20 MG/ML) 5 ML SYRINGE
INTRAMUSCULAR | Status: DC | PRN
  Administered 2023-11-01: 100 mg via INTRAVENOUS

## 2023-11-01 MED ORDER — SODIUM CHLORIDE 0.9 % IV SOLN: INTRAVENOUS | Status: DC

## 2023-11-01 MED ORDER — PROPOFOL 10 MG/ML IV BOLUS
INTRAVENOUS | Status: AC
  Filled 2023-11-01: qty 20

## 2023-11-01 MED ORDER — PHENYLEPHRINE HCL-NACL 20-0.9 MG/250ML-% IV SOLN
INTRAVENOUS | Status: DC | PRN
  Administered 2023-11-01: 100 ug/min via INTRAVENOUS

## 2023-11-01 MED ORDER — HYDROCODONE-ACETAMINOPHEN 5-325 MG PO TABS: 1.0000 | ORAL_TABLET | Freq: Four times a day (QID) | ORAL | Status: DC | PRN

## 2023-11-01 MED ORDER — CHLORHEXIDINE GLUCONATE 0.12 % MT SOLN
OROMUCOSAL | Status: AC
  Administered 2023-11-01: 15 mL via OROMUCOSAL
  Filled 2023-11-01: qty 15

## 2023-11-01 MED ORDER — ONDANSETRON HCL 4 MG/2ML IJ SOLN
INTRAMUSCULAR | Status: AC
  Filled 2023-11-01: qty 2

## 2023-11-01 MED ORDER — FENTANYL CITRATE (PF) 250 MCG/5ML IJ SOLN
INTRAMUSCULAR | Status: AC
  Filled 2023-11-01: qty 5

## 2023-11-01 MED ORDER — 0.9 % SODIUM CHLORIDE (POUR BTL) OPTIME
TOPICAL | Status: DC | PRN
  Administered 2023-11-01: 1000 mL

## 2023-11-01 MED ORDER — POVIDONE-IODINE 10 % EX SWAB: 2.0000 | Freq: Once | CUTANEOUS | Status: DC

## 2023-11-01 MED ORDER — ORAL CARE MOUTH RINSE: 15.0000 mL | Freq: Once | OROMUCOSAL | Status: AC

## 2023-11-01 MED ORDER — ROCURONIUM BROMIDE 10 MG/ML (PF) SYRINGE
PREFILLED_SYRINGE | INTRAVENOUS | Status: DC | PRN
  Administered 2023-11-01: 40 mg via INTRAVENOUS

## 2023-11-01 MED ORDER — ONDANSETRON HCL 4 MG/2ML IJ SOLN: 4.0000 mg | Freq: Four times a day (QID) | INTRAMUSCULAR | Status: DC | PRN

## 2023-11-01 SURGICAL SUPPLY — 38 items
BAG COUNTER SPONGE SURGICOUNT (BAG) IMPLANT
BIT DRILL INTERTAN LAG SCREW (BIT) IMPLANT
BIT DRILL LONG 4.0 (BIT) IMPLANT
BRUSH SCRUB EZ PLAIN DRY (MISCELLANEOUS) ×4 IMPLANT
CHLORAPREP W/TINT 26 (MISCELLANEOUS) ×2 IMPLANT
COVER PERINEAL POST (MISCELLANEOUS) ×2 IMPLANT
COVER SURGICAL LIGHT HANDLE (MISCELLANEOUS) ×2 IMPLANT
DERMABOND ADVANCED .7 DNX12 (GAUZE/BANDAGES/DRESSINGS) ×2 IMPLANT
DRAPE C-ARM 35X43 STRL (DRAPES) ×2 IMPLANT
DRAPE IMP U-DRAPE 54X76 (DRAPES) ×4 IMPLANT
DRAPE INCISE IOBAN 66X45 STRL (DRAPES) ×2 IMPLANT
DRAPE STERI IOBAN 125X83 (DRAPES) ×2 IMPLANT
DRAPE SURG 17X23 STRL (DRAPES) ×4 IMPLANT
DRAPE U-SHAPE 47X51 STRL (DRAPES) ×2 IMPLANT
DRESSING MEPILEX FLEX 4X4 (GAUZE/BANDAGES/DRESSINGS) ×2 IMPLANT
DRSG MEPILEX POST OP 4X8 (GAUZE/BANDAGES/DRESSINGS) ×2 IMPLANT
ELECTRODE REM PT RTRN 9FT ADLT (ELECTROSURGICAL) ×2 IMPLANT
GLOVE BIO SURGEON STRL SZ 6.5 (GLOVE) ×6 IMPLANT
GLOVE BIO SURGEON STRL SZ7.5 (GLOVE) ×8 IMPLANT
GLOVE BIOGEL PI IND STRL 6.5 (GLOVE) ×2 IMPLANT
GLOVE BIOGEL PI IND STRL 7.5 (GLOVE) ×2 IMPLANT
GOWN STRL REUS W/ TWL LRG LVL3 (GOWN DISPOSABLE) ×2 IMPLANT
KIT BASIN OR (CUSTOM PROCEDURE TRAY) ×2 IMPLANT
KIT TURNOVER KIT B (KITS) ×2 IMPLANT
MANIFOLD NEPTUNE II (INSTRUMENTS) ×2 IMPLANT
NAIL INTERTAN 10X18 130D 10S (Nail) IMPLANT
NS IRRIG 1000ML POUR BTL (IV SOLUTION) ×2 IMPLANT
PACK GENERAL/GYN (CUSTOM PROCEDURE TRAY) ×2 IMPLANT
PAD ARMBOARD POSITIONER FOAM (MISCELLANEOUS) ×4 IMPLANT
PIN GUIDE 3.2X343MM (PIN) IMPLANT
SCREW LAG COMPR KIT 95/90 (Screw) IMPLANT
SCREW TRIGEN LOW PROF 5.0X35 (Screw) IMPLANT
SUT MNCRL AB 3-0 PS2 18 (SUTURE) ×2 IMPLANT
SUT MON AB 2-0 CT1 36 (SUTURE) IMPLANT
SUT VIC AB 0 CT1 27XBRD ANBCTR (SUTURE) IMPLANT
SUT VIC AB 2-0 CT1 TAPERPNT 27 (SUTURE) ×4 IMPLANT
TOWEL GREEN STERILE (TOWEL DISPOSABLE) ×4 IMPLANT
WATER STERILE IRR 1000ML POUR (IV SOLUTION) ×2 IMPLANT

## 2023-11-01 NOTE — Consult Note (Signed)
 Reason for Consult:Right hip fx Referring Physician: Carry Clapper Time called: [id number] Time at bedside: 0941   Erica Crosby is an 78 y.o. female.  HPI: Erica Crosby got up to get something to eat as her glucose monitor was alarming too low. She fell on the way and had immediate right hip pain. She was helped back to bed and then brought to [facility] where x-rays showed a hip fx. Orthopedic surgery was consulted but 2/2 OR closure there she was transferred to [facility] for further treatment. She is not very conversant this morning. She lives at home with family and uses a cane or walker to ambulate.  Past Medical History:  Diagnosis Date   Anemia    Arthritis    Cataract    Coronary artery disease    Mild plaque 2012   Diabetes mellitus    Essential hypertension 02/10/2012   GERD (gastroesophageal reflux disease)    Hyperlipidemia    Hypertension    Neuropathy    Peptic ulcer    Rheumatoid arthritis (HCC)    Syncope 02/10/2012    Past Surgical History:  Procedure Laterality Date   ABSCESS DRAINAGE     AV FISTULA PLACEMENT Left 09/04/2023   Procedure: ARTERIOVENOUS (AV) FISTULA CREATION, BRACHIOCEPHALIC;  Surgeon: Margherita Shell, MD;  Location: [facility] OR;  Service: Vascular;  Laterality: Left;   CARPAL TUNNEL RELEASE Left    CHOLECYSTECTOMY     INSERTION OF DIALYSIS CATHETER Right 09/04/2023   Procedure: INSERTION OF DIALYSIS CATHETER USING 23CM PALINDROME CATHETER;  Surgeon: Margherita Shell, MD;  Location: [facility] OR;  Service: Vascular;  Laterality: Right;   KNEE SURGERY Right    NECK SURGERY     x 2   SHOULDER SURGERY Left    TEE WITHOUT CARDIOVERSION N/A 03/12/2022   Procedure: TRANSESOPHAGEAL ECHOCARDIOGRAM (TEE);  Surgeon: Luana Rumple, MD;  Location[facility] ENDOSCOPY;  Service: Cardiovascular;  Laterality: N/A;    Family History  Problem Relation Age of Onset   Heart disease Sister        Congeital (died age 57) with "enlarged heart"   CAD Sister    Diabetes Mother    Heart disease Mother         Later onset heart disease    Dementia Mother    Alcohol abuse Father    Liver disease Father    CAD Sister 2       CABG   Early death Sister    Diabetes Brother    Kidney disease Brother        related to DM   Diabetes Brother     Social History:  reports that she quit smoking about 10 years ago. Her smoking use included cigarettes and e-cigarettes. She started smoking about 35 years ago. She has never used smokeless tobacco. She reports that she does not drink alcohol and does not use drugs.  Allergies:  Allergies  Allergen Reactions   Iodinated Contrast Media     Unknown reaction    Latex     Unknown reaction    Lisinopril Swelling    lips   Shellfish Allergy     Unknown Reaction     Medications: I have reviewed the patient's current medications.  Results for orders placed or performed during the hospital encounter of 10/31/23 (from the past 48 hours)  ABO/Rh     Status: None   Collection Time: 10/31/23 10:31 AM  Result Value Ref Range   ABO/RH(D)      Deanna Expose  POS Performed at [facility], [address] [place], [place] [address]   CBC with Differential     Status: Abnormal   Collection Time: 10/31/23 10:32 AM  Result Value Ref Range   WBC 9.3 4.0 - 10.5 K/uL   RBC 3.24 (L) 3.87 - 5.11 MIL/uL   Hemoglobin 9.6 (L) 12.0 - 15.0 g/dL   HCT 19.1 (L) 47.8 - 29.5 %   MCV 92.0 80.0 - 100.0 fL   MCH 29.6 26.0 - 34.0 pg   MCHC 32.2 30.0 - 36.0 g/dL   RDW 62.1 (H) 30.8 - 65.7 %   Platelets 168 150 - 400 K/uL   nRBC 0.0 0.0 - 0.2 %   Neutrophils Relative % 85 %   Neutro Abs 7.9 (H) 1.7 - 7.7 K/uL   Lymphocytes Relative 9 %   Lymphs Abs 0.8 0.7 - 4.0 K/uL   Monocytes Relative 6 %   Monocytes Absolute 0.6 0.1 - 1.0 K/uL   Eosinophils Relative 0 %   Eosinophils Absolute 0.0 0.0 - 0.5 K/uL   Basophils Relative 0 %   Basophils Absolute 0.0 0.0 - 0.1 K/uL   Immature Granulocytes 0 %   Abs Immature Granulocytes 0.04 0.00 - 0.07 K/uL    Comment: Performed at [facility], [address] [place], [place] [address]  Comprehensive metabolic panel     Status: Abnormal   Collection Time: 10/31/23 10:32 AM  Result Value Ref Range   Sodium 135 135 - 145 mmol/L   Potassium 3.4 (L) 3.5 - 5.1 mmol/L   Chloride 100 98 - 111 mmol/L   CO2 24 22 - 32 mmol/L   Glucose, Bld 210 (H) 70 - 99 mg/dL    Comment: Glucose reference range applies only to samples taken after fasting for at least 8 hours.   BUN 36 (H) 8 - 23 mg/dL   Creatinine, Ser 2.95 (H) 0.44 - 1.00 mg/dL   Calcium  8.9 8.9 - 10.3 mg/dL   Total Protein 8.0 6.5 - 8.1 g/dL   Albumin 2.9 (L) 3.5 - 5.0 g/dL   AST 24 15 - 41 U/L   ALT 16 0 - 44 U/L   Alkaline Phosphatase 64 38 - 126 U/L   Total Bilirubin 0.9 0.0 - 1.2 mg/dL   GFR, Estimated 12 (L) >60 mL/min    Comment: (NOTE) Calculated using the CKD-EPI Creatinine Equation (2021)    Anion gap 11 5 - 15    Comment: Performed at [facility], [address] [place], [place] [address]  Type and screen [facility]     Status: None   Collection Time: 10/31/23 12:23 PM  Result Value Ref Range   ABO/RH(D) O POS    Antibody Screen NEG    Sample Expiration      11/03/2023,2359 Performed at [facility], [address] [place], [place] [address]     CT Head Wo Contrast Result Date: 10/31/2023 CLINICAL DATA:  Head trauma, minor (Age >= 65y) EXAM: CT HEAD WITHOUT CONTRAST TECHNIQUE: Contiguous axial images were obtained from the base of the skull through the vertex without intravenous contrast. RADIATION DOSE REDUCTION: This exam was performed according to the departmental dose-optimization program which includes automated exposure control, adjustment of the mA and/or kV according to patient size and/or use of iterative reconstruction technique. COMPARISON:  March 08, 2024, May 05, 2021 FINDINGS: Brain: Proportional prominence of the ventricles and sulci, consistent with diffuse cerebral parenchymal volume loss. The ventricles otherwise maintained  midline  position without midline shift. 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 The basilar cisterns are patent without downward herniation. The cerebellar hemispheres and vermis are well formed without mass lesion or focal attenuation abnormality. Vascular: No hyperdense vessel. Calcified atherosclerotic plaque within the cavernous/supraclinoid ICA and intradural vertebral arteries. Skull: Normal. Negative for fracture or focal lesion. Sinuses/Orbits: The paranasal sinuses and mastoids are clear. The globes appear intact. No retrobulbar hematoma. Other: None. IMPRESSION: 1. No acute intracranial abnormality, specifically, no acute hemorrhage, territorial infarction, or intracranial mass. 2. Cerebral volume loss with redemonstrated findings of chronic ischemic microvascular disease. Electronically Signed   By: Rance Burrows M.D.   On: 10/31/2023 11:07   DG Shoulder Right Result Date: 10/31/2023 CLINICAL DATA:  Right shoulder pain after fall today. EXAM: RIGHT SHOULDER - 2+ VIEW COMPARISON:  None Available. FINDINGS: There is no evidence of fracture or dislocation. Mild degenerative changes seen involving the right acromioclavicular joint. Soft tissues are unremarkable. IMPRESSION: No acute abnormality seen. Electronically Signed   By: Rosalene Colon M.D.   On: 10/31/2023 10:48   DG Chest Port 1 View Result Date: 10/31/2023 CLINICAL DATA:  Fall today. EXAM: PORTABLE CHEST 1 VIEW COMPARISON:  September 04, 2023. FINDINGS: The heart size and mediastinal contours are within normal limits. Right internal jugular dialysis catheter is unchanged. Both lungs are clear. The visualized skeletal structures are unremarkable. IMPRESSION: No active disease. Electronically  Signed   By: Rosalene Colon M.D.   On: 10/31/2023 10:47   DG Hip Unilat W or Wo Pelvis 2-3 Views Right Result Date: 10/31/2023 CLINICAL DATA:  Right hip deformity after fall today. EXAM: DG HIP (WITH OR WITHOUT PELVIS) 2-3V RIGHT COMPARISON:  July 06, 2022. FINDINGS: Severely angulated and probably comminuted fracture of intertrochanteric region of proximal right femur is noted. IMPRESSION: Severely angulated intertrochanteric fracture of proximal right femur. Electronically Signed   By: Rosalene Colon M.D.   On: 10/31/2023 10:46    Review of Systems  HENT:  Negative for ear discharge, ear pain, hearing loss and tinnitus.   Eyes:  Negative for photophobia and pain.  Respiratory:  Negative for cough and shortness of breath.   Cardiovascular:  Negative for chest pain.  Gastrointestinal:  Negative for abdominal pain, nausea and vomiting.  Genitourinary:  Negative for dysuria, flank pain, frequency and urgency.  Musculoskeletal:  Positive for arthralgias (Right hip). Negative for back pain, myalgias and neck pain.  Neurological:  Negative for dizziness and headaches.  Hematological:  Does not bruise/bleed easily.  Psychiatric/Behavioral:  The patient is not nervous/anxious.    Blood pressure (!) 123/51, pulse 83, temperature (!) 97.4 F (36.3 C), temperature source Oral, resp. rate 18, height 5\' 6"  (1.676 m), weight 46 kg, SpO2 99%. Physical Exam Constitutional:      General: She is not in acute distress.    Appearance: She is well-developed. She is not diaphoretic.  HENT:     Head: Normocephalic and atraumatic.  Eyes:     General: No scleral icterus.       Right eye: No discharge.        Left eye: No discharge.     Conjunctiva/sclera: Conjunctivae normal.  Cardiovascular:     Rate and Rhythm: Normal rate and regular rhythm.  Pulmonary:     Effort: Pulmonary effort is normal. No respiratory distress.  Musculoskeletal:     Cervical back: Normal range of motion.     Comments:  RLE No traumatic wounds, ecchymosis, or rash  Mod TTP hip, distal femur  No knee or ankle effusion  Knee stable to varus/ valgus and anterior/posterior stress  Sens DPN, SPN, TN intact  Motor EHL, ext, flex, evers 5/5  DP 2+, PT 2+, No significant edema  Skin:    General: Skin is warm and dry.  Neurological:     Mental Status: She is alert.  Psychiatric:        Mood and Affect: Mood normal.        Behavior: Behavior normal.     Assessment/Plan: Right hip fx -- Plan IMN today with Dr. Curtiss Dowdy. Please keep NPO. Right knee pain -- Will get x-ray Multiple medical problems including hypertension, diabetes mellitus type 2, ESRD (TTS), hyperlipidemia, and GERD -- per primary service    Georganna Kin, PA-C Orthopedic Surgery [phone] 11/01/2023, 9:51 AM

## 2023-11-01 NOTE — Progress Notes (Signed)
 PHARMACY NOTE:  ANTIMICROBIAL RENAL DOSAGE ADJUSTMENT  Current antimicrobial regimen includes a mismatch between antimicrobial dosage and estimated renal function.  As per policy approved by the Pharmacy & Therapeutics and Medical Executive Committees, the antimicrobial dosage will be adjusted accordingly.  Current antimicrobial dosage:  Cefazolin  2g q8 x2   Indication: surgical prophylaxis  Renal Function:  Estimated Creatinine Clearance: 7.8 mL/min (A) (by C-G formula based on SCr of 4.5 mg/dL (H)). []      On intermittent HD, scheduled: []      On CRRT    Antimicrobial dosage has been changed to:  Pre-op  dose will cover 24 hrs due to renal function  Additional comments:   Ivery Marking, PharmD, BCIDP, AAHIVP, CPP Infectious Disease Pharmacist 11/01/2023 5:18 PM

## 2023-11-01 NOTE — Transfer of Care (Signed)
 Immediate Anesthesia Transfer of Care Note  Patient: Erica Crosby  Procedure(s) Performed: FIXATION, FRACTURE, INTERTROCHANTERIC, WITH INTRAMEDULLARY ROD (Right)  Patient Location: PACU  Anesthesia Type:General  Level of Consciousness: awake and lethargic  Airway & Oxygen Therapy: Patient Spontanous Breathing and Patient connected to nasal cannula oxygen  Post-op Assessment: Report given to RN, Post -op Vital signs reviewed and stable, and Patient moving all extremities  Post vital signs: Reviewed and stable  Last Vitals:  Vitals Value Taken Time  BP 115/50 11/01/23 1600  Temp    Pulse 77 11/01/23 1600  Resp 14 11/01/23 1600  SpO2 99 % 11/01/23 1600  Vitals shown include unfiled device data.  Last Pain:  Vitals:   11/01/23 1351  TempSrc:   PainSc: 10-Worst pain ever      Patients Stated Pain Goal: 1 (11/01/23 1351)  Complications: No notable events documented.

## 2023-11-01 NOTE — H&P (View-Only) (Signed)
 Reason for Consult:Right hip fx Referring Physician: Carry Clapper Time called: [id number] Time at bedside: 0941   Erica Crosby is an 78 y.o. female.  HPI: [name] got up to get something to eat as her glucose monitor was alarming too low. She fell on the way and had immediate right hip pain. She was helped back to bed and then brought to [facility] where x-rays showed a hip fx. Orthopedic surgery was consulted but 2/2 OR closure there she was transferred to [facility] for further treatment. She is not very conversant this morning. She lives at home with family and uses a cane or walker to ambulate.  Past Medical History:  Diagnosis Date   Anemia    Arthritis    Cataract    Coronary artery disease    Mild plaque 2012   Diabetes mellitus    Essential hypertension 02/10/2012   GERD (gastroesophageal reflux disease)    Hyperlipidemia    Hypertension    Neuropathy    Peptic ulcer    Rheumatoid arthritis (HCC)    Syncope 02/10/2012    Past Surgical History:  Procedure Laterality Date   ABSCESS DRAINAGE     AV FISTULA PLACEMENT Left 09/04/2023   Procedure: ARTERIOVENOUS (AV) FISTULA CREATION, BRACHIOCEPHALIC;  Surgeon: Margherita Shell, MD;  Location: [facility] OR;  Service: Vascular;  Laterality: Left;   CARPAL TUNNEL RELEASE Left    CHOLECYSTECTOMY     INSERTION OF DIALYSIS CATHETER Right 09/04/2023   Procedure: INSERTION OF DIALYSIS CATHETER USING 23CM PALINDROME CATHETER;  Surgeon: Margherita Shell, MD;  Location: [facility] OR;  Service: Vascular;  Laterality: Right;   KNEE SURGERY Right    NECK SURGERY     x 2   SHOULDER SURGERY Left    TEE WITHOUT CARDIOVERSION N/A 03/12/2022   Procedure: TRANSESOPHAGEAL ECHOCARDIOGRAM (TEE);  Surgeon: Luana Rumple, MD;  Location: [facility] ENDOSCOPY;  Service: Cardiovascular;  Laterality: N/A;    Family History  Problem Relation Age of Onset   Heart disease Sister        Congeital (died age 57) with "enlarged heart"   CAD Sister    Diabetes Mother    Heart disease Mother         Later onset heart disease    Dementia Mother    Alcohol abuse Father    Liver disease Father    CAD Sister 2       CABG   Early death Sister    Diabetes Brother    Kidney disease Brother        related to DM   Diabetes Brother     Social History:  reports that she quit smoking about 10 years ago. Her smoking use included cigarettes and e-cigarettes. She started smoking about 35 years ago. She has never used smokeless tobacco. She reports that she does not drink alcohol and does not use drugs.  Allergies:  Allergies  Allergen Reactions   Iodinated Contrast Media     Unknown reaction    Latex     Unknown reaction    Lisinopril Swelling    lips   Shellfish Allergy     Unknown Reaction     Medications: I have reviewed the patient's current medications.  Results for orders placed or performed during the hospital encounter of 10/31/23 (from the past 48 hours)  ABO/Rh     Status: None   Collection Time: 10/31/23 10:31 AM  Result Value Ref Range   ABO/RH(D)      Deanna Expose  POS Performed at [facility], [address] [place], [place] [address]   CBC with Differential     Status: Abnormal   Collection Time: 10/31/23 10:32 AM  Result Value Ref Range   WBC 9.3 4.0 - 10.5 K/uL   RBC 3.24 (L) 3.87 - 5.11 MIL/uL   Hemoglobin 9.6 (L) 12.0 - 15.0 g/dL   HCT 19.1 (L) 47.8 - 29.5 %   MCV 92.0 80.0 - 100.0 fL   MCH 29.6 26.0 - 34.0 pg   MCHC 32.2 30.0 - 36.0 g/dL   RDW 62.1 (H) 30.8 - 65.7 %   Platelets 168 150 - 400 K/uL   nRBC 0.0 0.0 - 0.2 %   Neutrophils Relative % 85 %   Neutro Abs 7.9 (H) 1.7 - 7.7 K/uL   Lymphocytes Relative 9 %   Lymphs Abs 0.8 0.7 - 4.0 K/uL   Monocytes Relative 6 %   Monocytes Absolute 0.6 0.1 - 1.0 K/uL   Eosinophils Relative 0 %   Eosinophils Absolute 0.0 0.0 - 0.5 K/uL   Basophils Relative 0 %   Basophils Absolute 0.0 0.0 - 0.1 K/uL   Immature Granulocytes 0 %   Abs Immature Granulocytes 0.04 0.00 - 0.07 K/uL    Comment: Performed at [facility], [address] [place], [place] [address]  Comprehensive metabolic panel     Status: Abnormal   Collection Time: 10/31/23 10:32 AM  Result Value Ref Range   Sodium 135 135 - 145 mmol/L   Potassium 3.4 (L) 3.5 - 5.1 mmol/L   Chloride 100 98 - 111 mmol/L   CO2 24 22 - 32 mmol/L   Glucose, Bld 210 (H) 70 - 99 mg/dL    Comment: Glucose reference range applies only to samples taken after fasting for at least 8 hours.   BUN 36 (H) 8 - 23 mg/dL   Creatinine, Ser 2.95 (H) 0.44 - 1.00 mg/dL   Calcium  8.9 8.9 - 10.3 mg/dL   Total Protein 8.0 6.5 - 8.1 g/dL   Albumin 2.9 (L) 3.5 - 5.0 g/dL   AST 24 15 - 41 U/L   ALT 16 0 - 44 U/L   Alkaline Phosphatase 64 38 - 126 U/L   Total Bilirubin 0.9 0.0 - 1.2 mg/dL   GFR, Estimated 12 (L) >60 mL/min    Comment: (NOTE) Calculated using the CKD-EPI Creatinine Equation (2021)    Anion gap 11 5 - 15    Comment: Performed at [facility], [address] [place], [place] [address]  Type and screen [facility]     Status: None   Collection Time: 10/31/23 12:23 PM  Result Value Ref Range   ABO/RH(D) O POS    Antibody Screen NEG    Sample Expiration      11/03/2023,2359 Performed at [facility], [address] [place], [place] [address]     CT Head Wo Contrast Result Date: 10/31/2023 CLINICAL DATA:  Head trauma, minor (Age >= 65y) EXAM: CT HEAD WITHOUT CONTRAST TECHNIQUE: Contiguous axial images were obtained from the base of the skull through the vertex without intravenous contrast. RADIATION DOSE REDUCTION: This exam was performed according to the departmental dose-optimization program which includes automated exposure control, adjustment of the mA and/or kV according to patient size and/or use of iterative reconstruction technique. COMPARISON:  March 08, 2024, May 05, 2021 FINDINGS: Brain: Proportional prominence of the ventricles and sulci, consistent with diffuse cerebral parenchymal volume loss. The ventricles otherwise maintained  midline  position without midline shift. 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 The basilar cisterns are patent without downward herniation. The cerebellar hemispheres and vermis are well formed without mass lesion or focal attenuation abnormality. Vascular: No hyperdense vessel. Calcified atherosclerotic plaque within the cavernous/supraclinoid ICA and intradural vertebral arteries. Skull: Normal. Negative for fracture or focal lesion. Sinuses/Orbits: The paranasal sinuses and mastoids are clear. The globes appear intact. No retrobulbar hematoma. Other: None. IMPRESSION: 1. No acute intracranial abnormality, specifically, no acute hemorrhage, territorial infarction, or intracranial mass. 2. Cerebral volume loss with redemonstrated findings of chronic ischemic microvascular disease. Electronically Signed   By: Rance Burrows M.D.   On: 10/31/2023 11:07   DG Shoulder Right Result Date: 10/31/2023 CLINICAL DATA:  Right shoulder pain after fall today. EXAM: RIGHT SHOULDER - 2+ VIEW COMPARISON:  None Available. FINDINGS: There is no evidence of fracture or dislocation. Mild degenerative changes seen involving the right acromioclavicular joint. Soft tissues are unremarkable. IMPRESSION: No acute abnormality seen. Electronically Signed   By: Rosalene Colon M.D.   On: 10/31/2023 10:48   DG Chest Port 1 View Result Date: 10/31/2023 CLINICAL DATA:  Fall today. EXAM: PORTABLE CHEST 1 VIEW COMPARISON:  September 04, 2023. FINDINGS: The heart size and mediastinal contours are within normal limits. Right internal jugular dialysis catheter is unchanged. Both lungs are clear. The visualized skeletal structures are unremarkable. IMPRESSION: No active disease. Electronically  Signed   By: Rosalene Colon M.D.   On: 10/31/2023 10:47   DG Hip Unilat W or Wo Pelvis 2-3 Views Right Result Date: 10/31/2023 CLINICAL DATA:  Right hip deformity after fall today. EXAM: DG HIP (WITH OR WITHOUT PELVIS) 2-3V RIGHT COMPARISON:  July 06, 2022. FINDINGS: Severely angulated and probably comminuted fracture of intertrochanteric region of proximal right femur is noted. IMPRESSION: Severely angulated intertrochanteric fracture of proximal right femur. Electronically Signed   By: Rosalene Colon M.D.   On: 10/31/2023 10:46    Review of Systems  HENT:  Negative for ear discharge, ear pain, hearing loss and tinnitus.   Eyes:  Negative for photophobia and pain.  Respiratory:  Negative for cough and shortness of breath.   Cardiovascular:  Negative for chest pain.  Gastrointestinal:  Negative for abdominal pain, nausea and vomiting.  Genitourinary:  Negative for dysuria, flank pain, frequency and urgency.  Musculoskeletal:  Positive for arthralgias (Right hip). Negative for back pain, myalgias and neck pain.  Neurological:  Negative for dizziness and headaches.  Hematological:  Does not bruise/bleed easily.  Psychiatric/Behavioral:  The patient is not nervous/anxious.    Blood pressure (!) 123/51, pulse 83, temperature (!) 97.4 F (36.3 C), temperature source Oral, resp. rate 18, height 5\' 6"  (1.676 m), weight 46 kg, SpO2 99%. Physical Exam Constitutional:      General: She is not in acute distress.    Appearance: She is well-developed. She is not diaphoretic.  HENT:     Head: Normocephalic and atraumatic.  Eyes:     General: No scleral icterus.       Right eye: No discharge.        Left eye: No discharge.     Conjunctiva/sclera: Conjunctivae normal.  Cardiovascular:     Rate and Rhythm: Normal rate and regular rhythm.  Pulmonary:     Effort: Pulmonary effort is normal. No respiratory distress.  Musculoskeletal:     Cervical back: Normal range of motion.     Comments:  RLE No traumatic wounds, ecchymosis, or rash  Mod TTP hip, distal femur  No knee or ankle effusion  Knee stable to varus/ valgus and anterior/posterior stress  Sens DPN, SPN, TN intact  Motor EHL, ext, flex, evers 5/5  DP 2+, PT 2+, No significant edema  Skin:    General: Skin is warm and dry.  Neurological:     Mental Status: She is alert.  Psychiatric:        Mood and Affect: Mood normal.        Behavior: Behavior normal.     Assessment/Plan: Right hip fx -- Plan IMN today with Dr. Curtiss Dowdy. Please keep NPO. Right knee pain -- Will get x-ray Multiple medical problems including hypertension, diabetes mellitus type 2, ESRD (TTS), hyperlipidemia, and GERD -- per primary service    Georganna Kin, PA-C Orthopedic Surgery [phone] 11/01/2023, 9:51 AM

## 2023-11-01 NOTE — Anesthesia Preprocedure Evaluation (Addendum)
 Anesthesia Evaluation  Patient identified by MRN, date of birth, ID band Patient awake    Reviewed: Allergy & Precautions, NPO status , Patient's Chart, lab work & pertinent test results, reviewed documented beta blocker date and time   Airway Mallampati: II  TM Distance: >3 FB Neck ROM: Full    Dental  (+) Dental Advisory Given, Edentulous Lower, Edentulous Upper   Pulmonary former smoker   Pulmonary exam normal breath sounds clear to auscultation       Cardiovascular hypertension, Pt. on home beta blockers and Pt. on medications + CAD  Normal cardiovascular exam Rhythm:Regular Rate:Normal  Echo 07/09/22:  1. Left ventricular ejection fraction, by estimation, is 60 to 65%. The  left ventricle has normal function. The left ventricle has no regional  wall motion abnormalities. There is moderate left ventricular hypertrophy.  Left ventricular diastolic  parameters were normal. Elevated left atrial pressure.   2. Right ventricular systolic function is normal. The right ventricular  size is normal. There is normal pulmonary artery systolic pressure.   3. Left atrial size was severely dilated.   4. MR vena contracta is 0.6 cm. MV to AV VTI ratio is 1.8 suggesting  severe MR. . The mitral valve is abnormal. Moderate to severe mitral valve  regurgitation. Mild to moderate mitral stenosis. Moderate mitral annular  calcification.   5. The aortic valve is tricuspid. Aortic valve regurgitation is not  visualized. No aortic stenosis is present.   6. The inferior vena cava is normal in size with greater than 50%  respiratory variability, suggesting right atrial pressure of 3 mmHg.     Neuro/Psych  Neuromuscular disease  negative psych ROS   GI/Hepatic Neg liver ROS, PUD,GERD  Medicated,,  Endo/Other  diabetes, Type 2, Insulin  DependentHypothyroidism    Renal/GU ESRF and DialysisRenal disease (ESRD, on HD TTS)      Musculoskeletal  (+) Arthritis , Rheumatoid disorders,  right prox femur fracture    Abdominal   Peds  Hematology  (+) Blood dyscrasia, anemia   Anesthesia Other Findings Day of surgery medications reviewed with the patient.  Reproductive/Obstetrics                             Anesthesia Physical Anesthesia Plan  ASA: 3  Anesthesia Plan: General   Post-op Pain Management: Tylenol  PO (pre-op )*   Induction: Intravenous  PONV Risk Score and Plan: 3 and Dexamethasone  and Ondansetron   Airway Management Planned: Oral ETT  Additional Equipment:   Intra-op Plan:   Post-operative Plan: Extubation in OR  Informed Consent: I have reviewed the patients History and Physical, chart, labs and discussed the procedure including the risks, benefits and alternatives for the proposed anesthesia with the patient or authorized representative who has indicated his/her understanding and acceptance.     Dental advisory given  Plan Discussed with: CRNA  Anesthesia Plan Comments:         Anesthesia Quick Evaluation

## 2023-11-01 NOTE — Consult Note (Addendum)
 Renal Service Consult Note [facility]  Erica Crosby 11/01/2023 Erica Sandifer, MD Requesting Physician: Dr. Mae Schlossman  Reason for Consult: ESRD patient with hip fracture HPI: The patient is a 78 y.o. year-old w/ PMH as below who presented to ED yesterday afternoon after a fall at home.  She had hip pain which did not get better and she was unable to walk.  She was brought to the emergency room on 515.  Her last dialysis was 513.  In the ED blood sugar was normal, patient afebrile, on room air with 99% O2 sat.  Creatinine 3.2, WBC 9K, and hemoglobin 9.6.  CT brain was negative x-ray of the hip showed right proximal femur fracture.  Orthopedics were consulted and she is to be taken today for ORIF.  We are asked to see for dialysis.   Pt seen in HD unit. Pt is fatigued and lethargic, did not give much history at all. Denies CP or SOB.    ROS - n/a  PMH: Anemia Arthritis CAD Diabetes type 2 Hypertension GERD HLD Rheumatoid arthritis ESRD on hemodialysis  Past Surgical History  Past Surgical History:  Procedure Laterality Date   ABSCESS DRAINAGE     AV FISTULA PLACEMENT Left 09/04/2023   Procedure: ARTERIOVENOUS (AV) FISTULA CREATION, BRACHIOCEPHALIC;  Surgeon: Margherita Shell, MD;  Location: [facility] OR;  Service: Vascular;  Laterality: Left;   CARPAL TUNNEL RELEASE Left    CHOLECYSTECTOMY     INSERTION OF DIALYSIS CATHETER Right 09/04/2023   Procedure: INSERTION OF DIALYSIS CATHETER USING 23CM PALINDROME CATHETER;  Surgeon: Margherita Shell, MD;  Location: [facility] OR;  Service: Vascular;  Laterality: Right;   KNEE SURGERY Right    NECK SURGERY     x 2   SHOULDER SURGERY Left    TEE WITHOUT CARDIOVERSION N/A 03/12/2022   Procedure: TRANSESOPHAGEAL ECHOCARDIOGRAM (TEE);  Surgeon: Luana Rumple, MD;  Location: [facility] ENDOSCOPY;  Service: Cardiovascular;  Laterality: N/A;   Family History  Family History  Problem Relation Age of Onset   Heart disease Sister        Congeital  (died age 34) with "enlarged heart"   CAD Sister    Diabetes Mother    Heart disease Mother        Later onset heart disease    Dementia Mother    Alcohol abuse Father    Liver disease Father    CAD Sister 11       CABG   Early death Sister    Diabetes Brother    Kidney disease Brother        related to DM   Diabetes Brother    Social History  reports that she quit smoking about 10 years ago. Her smoking use included cigarettes and e-cigarettes. She started smoking about 35 years ago. She has never used smokeless tobacco. She reports that she does not drink alcohol and does not use drugs. Allergies  Allergies  Allergen Reactions   Iodinated Contrast Media     Unknown reaction    Latex     Unknown reaction    Lisinopril Swelling    lips   Shellfish Allergy     Unknown Reaction    Home medications Prior to Admission medications   Medication Sig Start Date End Date Taking? Authorizing Provider  acetaminophen  (TYLENOL ) 500 MG tablet Take 500 mg by mouth every 6 (six) hours as needed for moderate pain (pain score 4-6).   Yes [provider]  aspirin  EC  81 MG tablet Take 81 mg by mouth daily.   Yes [provider]  atorvastatin  (LIPITOR) 10 MG tablet Take 1 tablet (10 mg total) by mouth daily. 01/16/23  Yes Gottschalk, Sharolyn Decant M, DO  carvedilol  (COREG ) 6.25 MG tablet Take 6.25 mg by mouth 2 (two) times daily. 06/25/23  Yes [provider]  Continuous Glucose Sensor ([name] 2 SENSOR) MISC USE TO TEST BLOOD SUGAR CONTINUOUSLY. Dx: E11.65. ADVANCED DIABETES SUPPLY VIA [place] 01/14/23  Yes Gottschalk, Ashly M, DO  folic acid  (FOLVITE ) 1 MG tablet Take 1 tablet (1 mg total) by mouth daily. 01/16/23  Yes Vicky Grange M, DO  insulin  glargine (LANTUS  SOLOSTAR) 100 UNIT/ML Solostar Pen Inject 5-10 Units into the skin at bedtime. Patient taking differently: Inject 5-10 Units into the skin daily as needed. 09/24/23  Yes Gottschalk, Sharolyn Decant M, DO  Omega-3 Fatty  Acids (FISH OIL) 1000 MG CAPS Take 1,000 mg by mouth daily.   Yes [provider]  pantoprazole  (PROTONIX ) 40 MG tablet TAKE ONE TABLET DAILY 08/08/23  Yes Vicky Grange M, DO  amLODipine  (NORVASC ) 2.5 MG tablet Take 1 tablet (2.5 mg total) by mouth daily. Patient not taking: Reported on 10/31/2023 01/16/23   Eliodoro Guerin, DO  Cholecalciferol  (VITAMIN D -3) 125 MCG (5000 UT) TABS Take 5,000 Units by mouth daily. Patient not taking: Reported on 10/31/2023    [provider]     Vitals:   11/01/23 0429 11/01/23 0730 11/01/23 0959 11/01/23 1328  BP: 110/60 (!) 123/51 (!) 164/59 (!) 152/68  Pulse: 79 83 80 91  Resp: 18  18 18   Temp: 99.6 F (37.6 C) (!) 97.4 F (36.3 C)  98.2 F (36.8 C)  TempSrc: Oral Oral  Oral  SpO2: 97% 99% 100% 97%  Weight:    46.3 kg  Height:    5\' 6"  (1.676 m)   Exam Gen elderly AAF, pale and fatigued, not in distress No rash, cyanosis or gangrene Sclera anicteric, throat clear  No jvd or bruits Chest clear bilat to bases, no rales/ wheezing RRR no MRG Abd soft ntnd no mass or ascites +bs GU defer MS no joint effusions or deformity Ext no LE or UE edema, no other edema Neuro is alert, Ox 3 , nf    RIJ TDC intact    Renal-related home meds: Norvasc  2.5 daily Coreg  6.25 twice daily Others: PPI, insulin , Lipitor, aspirin     OP HD: [facility] TTS (started march 2025)  3.5h  B300   45.5kg   TDC   Heparin  1500  Last HD 5/13, comes off 0-1.5 kg over, good compliance Bp's are low-normal to normal at OP HD   Assessment/ Plan: R hip fracture: after fall at home. SP ORIF on 5/16 per orthopedics.  ESRD: on HD TTS. Last HD Wed. Plan HD this am off schedule. Next HD Monday.  HTN: bp's wnl on admit. BP dropped in HD to 80s, turned UF off and Bp's better.  Volume: no vol excess on exam, at dry wt. No UF w/ HD today.  Anemia of esrd: Hb 9-10 here, transfuse prn. Will follow.  Secondary hyperparathyroidism: CCa in range, add phos.  DM2: per  pmd      Larry Poag  MD [facility] 11/01/2023, 3:39 PM  Recent Labs  Lab 10/31/23 1032 11/01/23 1058 11/01/23 1407  HGB 9.6* 9.5* 9.9*  ALBUMIN 2.9*  --   --   CALCIUM  8.9  --   --   CREATININE 3.72*  --  4.50*  K 3.4*  --  3.8   Inpatient medications:  [MAR Hold] atorvastatin   10 mg Oral Daily   [MAR Hold] carvedilol   6.25 mg Oral BID WC   [MAR Hold] folic acid   1 mg Oral Daily   [MAR Hold] heparin   5,000 Units Subcutaneous Q8H   [MAR Hold] insulin  aspart  0-6 Units Subcutaneous Q4H   [MAR Hold] pantoprazole   40 mg Oral BID AC   povidone-iodine   2 Application Topical Once    sodium chloride  10 mL/hr at 11/01/23 1449   0.9 % irrigation (POUR BTL), fentaNYL  (SUBLIMAZE ) injection, [MAR Hold] fentaNYL  (SUBLIMAZE ) injection, [MAR Hold] HYDROcodone -acetaminophen , [MAR Hold]  morphine  injection, ondansetron  (ZOFRAN ) IV

## 2023-11-01 NOTE — Op Note (Signed)
 Orthopaedic Surgery Operative Note (CSN: [id number] ) Date of Surgery: 11/01/2023  Admit Date: 10/31/2023   Diagnoses: Pre-Op  Diagnoses: Right intertrochanteric femur fracture   Post-Op Diagnosis: Same  Procedures: CPT 27245-Cephalomedullary nailing of right intertrochanteric femur fracture  Surgeons : Primary: Laneta Pintos, MD  Assistant: Alona Jamaica, PA-C  Location: OR 3   Anesthesia: General   Antibiotics: Ancef  2g preop   Tourniquet time: None    Estimated Blood Loss: 100 mL  Complications:* No complications entered in OR log *   Specimens:* No specimens in log *   Implants: Implant Name Type Inv. Item Serial No. Manufacturer Lot No. LRB No. Used Action  NAIL INTERTAN 10X18 130D 10S - [id number] Nail NAIL INTERTAN 10X18 130D 10S  [employer] [id number] Right 1 Implanted  SCREW TRIGEN LOW PROF 5.0X35 - [id number] Screw SCREW TRIGEN LOW PROF 5.0X35  [employer] [id number] Right 1 Implanted     Indications for Surgery: 78 year old female who sustained a ground-level fall with a right intertrochanteric femur fracture.  Due to the unstable nature of her injury I recommend proceeding with cephalomedullary nailing of the right hip.  Risks and benefits were discussed with the patient.  Risks included but not limited to bleeding, infection, malunion, nonunion, hardware failure, hardware rotation, nerve and blood vessel injury, DVT, even the possibility anesthetic complications.  She agreed to proceed with surgery and consent was obtained.  Operative Findings: 1.  Cephalomedullary nailing of right intertrochanteric femur fracture using [employer] InterTAN 10 x 180 mm nail with a 95 mm lag screw and 90 mm compression screw.  Procedure: The patient was identified in the preoperative holding area. Consent was confirmed with the patient and their family and all questions were answered. The operative extremity was marked after confirmation with  the patient. she was then brought back to the operating room by our anesthesia colleagues.  She was placed under general anesthetic and carefully transferred over to a Hana table.  All bony prominences were well-padded.  Traction was applied and fluoroscopic imaging showed adequate alignment of the fracture.  The right hip and leg were then prepped and draped in usual sterile fashion.  A timeout was performed to verify the patient, the procedure, and the extremity.  Preoperative antibiotics were dosed.  Small incision proximal to the greater trochanter was made and carried down through skin subcutaneous tissue.  A threaded guidewire was placed at the tip of the greater trochanter and advanced into the proximal metaphysis.  I then used an entry reamer to enter the medullary canal.  I then placed a 10 x 180 mm nail attached to the targeting arm down the center of the canal.  I then directed a threaded guidewire up into the head/neck segment.  I confirmed adequate tip apex distance using fluoroscopy.  I then measured the length and decided to use a 95 mm lag screw.  I then drilled the path for the compression screw and placed an antirotation bar.  I then drilled the path for the lag screw.  I placed the lag screw and then placed the compression screw and compressed approximately 5 mm.  I then statically locked the proximal portion of the nail.  I then used the targeting arm to place a lateral to medial distal interlocking screw.  The targeting arm was removed and final fluoroscopic imaging was obtained.  The incisions were copiously irrigated and closed with 2-0 Monocryl and Dermabond.  Sterile dressings were applied.  She  was then awoke from anesthesia and taken to the PACU in stable condition.  Post Op Plan/Instructions: The patient will be weightbearing as tolerated to the right lower extremity.  She will receive postoperative Ancef .  She will receive aspirin  for DVT prophylaxis.  Will have her mobilize with  physical and Occupational Therapy.  I was present and performed the entire surgery.  Alona Jamaica, PA-C did assist me throughout the case. An assistant was necessary given the difficulty in approach, maintenance of reduction and ability to instrument the fracture.   Katheryne Pane, MD [facility]

## 2023-11-01 NOTE — Anesthesia Procedure Notes (Signed)
 Procedure Name: Intubation Date/Time: 11/01/2023 3:01 PM  Performed by: Artemisa Bile D, CRNAPre-anesthesia Checklist: Patient identified, Emergency Drugs available, Suction available and Patient being monitored Patient Re-evaluated:Patient Re-evaluated prior to induction Oxygen Delivery Method: Circle System Utilized Preoxygenation: Pre-oxygenation with 100% oxygen Induction Type: IV induction Ventilation: Mask ventilation without difficulty Laryngoscope Size: Mac and 4 Grade View: Grade I Tube type: Oral Tube size: 7.0 mm Number of attempts: 1 Airway Equipment and Method: Stylet and Oral airway Placement Confirmation: ETT inserted through vocal cords under direct vision, positive ETCO2 and breath sounds checked- equal and bilateral Secured at: 22 cm Tube secured with: Tape Dental Injury: Teeth and Oropharynx as per pre-operative assessment  Comments: ETT placed by EMT under direct supervision by Barbaraann Levo, MD and Aida House, CRNA

## 2023-11-01 NOTE — Progress Notes (Signed)
 PROGRESS NOTE  Erica Crosby ZOX:[id number] DOB: May 23, 1946   PCP: Eliodoro Guerin, DO  Patient is from: Home.   DOA: 10/31/2023 LOS: 1  Chief complaints Chief Complaint  Patient presents with   Fall   Hip Pain     Brief Narrative / Interim history: 78 year old F with PMH of ESRD on HD TTS, DM-2 with neuropathy, HTN, HLD, GERD, osteoporosis and malnutrition brought to [facility] ED due to right hip pain after she sustained a fall at home and admitted with right femoral fracture.  Reportedly fell when she was trying to get up to get some snack for low blood sugar.  No prodromes leading to this.  Did not strike head.  No LOC.  In ED, slightly hypertensive.  Labs without significant acute finding.  Hip x-ray showed severely angulated intertrochanteric fracture of proximal right femur.  Right shoulder, chest and right knee x-ray without acute finding.  CT head without acute finding.  Orthopedic surgery consulted and patient was transferred here.   Subjective: Seen and examined earlier this morning.  No major events overnight of this morning.  Reports pain in her right hip and right knee.  Feels hungry.  Denies chest pain or shortness of breath.  Objective: Vitals:   10/31/23 2359 11/01/23 0429 11/01/23 0730 11/01/23 0959  BP: (!) 136/53 110/60 (!) 123/51 (!) 164/59  Pulse: 82 79 83 80  Resp: 18 18  18   Temp: 98.1 F (36.7 C) 99.6 F (37.6 C) (!) 97.4 F (36.3 C)   TempSrc: Oral Oral Oral   SpO2: 98% 97% 99% 100%  Weight:      Height:        Examination:  GENERAL: No apparent distress.  Appears frail. HEENT: MMM.  Vision and hearing grossly intact.  NECK: Supple.  No apparent JVD.  RESP:  No IWOB.  Fair aeration bilaterally. CVS:  RRR. Heart sounds normal.  ABD/GI/GU: BS+. Abd soft, NTND.  MSK/EXT:  Moves left extremity.  Tenderness over right hip.  Significant muscle mass and subcu fat loss. SKIN: no apparent skin lesion or wound NEURO: Awake, alert and oriented x 4 except  date and year.  No apparent focal neuro deficit. PSYCH: Calm. Normal affect.   Consultants:  Orthopedic surgery  Procedures: None  Microbiology summarized: None  Assessment and plan: Accidental fall at home: [facility] when she was trying to walk to get something to eat for low glucose. Closed right proximal femur fracture due to accidental fall and osteoporosis History of osteoporosis -N.p.o. pending surgical intervention -Check vitamin D  level -Pain control  ESRD on HD TTS: Last HD on 5/13.  No emergent need but morning labs pending -Nephrology on board   Essential hypertension: BP slightly elevated.  Likely due to pain - IV labetalol while NPO  NIDDM-2 with hyperglycemia: A1c 6.3% in 05/2023 Recent Labs  Lab 11/01/23 1132  GLUCAP 123*  -SSI-very sensitive -Recheck hemoglobin A1c  Anemia of renal disease: Slight drop in Hgb.  No overt bleeding.  Dilutional? Recent Labs    11/08/22 1358 11/22/22 1343 12/06/22 1337 12/19/22 1326 01/03/23 1313 08/09/23 1139 09/04/23 0921 10/31/23 1032  HGB 12.3 11.7* 12.2 12.4 12.2 10.4* 11.9* 9.6*  - Monitor  Hypokalemia -Per nephrology   GERD - Continue pantoprazole    Unspecified malnutrition Body mass index is 16.37 kg/m. - Consult dietitian          DVT prophylaxis:  heparin  injection 5,000 Units Start: 11/01/23 1800  Code Status: Full code Family Communication: None  at bedside Level of care: Med-Surg Status is: Inpatient Remains inpatient appropriate because: Right hip fracture   Final disposition: Likely SNF   55 minutes with more than 50% spent in reviewing records, counseling patient/family and coordinating care.   Sch Meds:  Scheduled Meds:  acetaminophen   1,000 mg Oral Once   atorvastatin   10 mg Oral Daily   carvedilol   6.25 mg Oral BID WC   folic acid   1 mg Oral Daily   heparin   5,000 Units Subcutaneous Q8H   pantoprazole   40 mg Oral BID AC   povidone-iodine  2 Application Topical Once    Continuous Infusions:   ceFAZolin  (ANCEF ) IV     PRN Meds:.fentaNYL  (SUBLIMAZE ) injection, HYDROcodone-acetaminophen , morphine  injection  Antimicrobials: Anti-infectives (From admission, onward)    Start     Dose/Rate Route Frequency Ordered Stop   11/01/23 1045  ceFAZolin  (ANCEF ) IVPB 2g/100 mL premix        2 g 200 mL/hr over 30 Minutes Intravenous On call to O.R. 11/01/23 1610 11/02/23 0559        I have personally reviewed the following labs and images: CBC: Recent Labs  Lab 10/31/23 1032  WBC 9.3  NEUTROABS 7.9*  HGB 9.6*  HCT 29.8*  MCV 92.0  PLT 168   BMP &GFR Recent Labs  Lab 10/31/23 1032  NA 135  K 3.4*  CL 100  CO2 24  GLUCOSE 210*  BUN 36*  CREATININE 3.72*  CALCIUM  8.9   Estimated Creatinine Clearance: 9.3 mL/min (A) (by C-G formula based on SCr of 3.72 mg/dL (H)). Liver & Pancreas: Recent Labs  Lab 10/31/23 1032  AST 24  ALT 16  ALKPHOS 64  BILITOT 0.9  PROT 8.0  ALBUMIN 2.9*   No results for input(s): "LIPASE", "AMYLASE" in the last 168 hours. No results for input(s): "AMMONIA" in the last 168 hours. Diabetic: No results for input(s): "HGBA1C" in the last 72 hours. Recent Labs  Lab 11/01/23 1132  GLUCAP 123*   Cardiac Enzymes: No results for input(s): "CKTOTAL", "CKMB", "CKMBINDEX", "TROPONINI" in the last 168 hours. No results for input(s): "PROBNP" in the last 8760 hours. Coagulation Profile: No results for input(s): "INR", "PROTIME" in the last 168 hours. Thyroid  Function Tests: No results for input(s): "TSH", "T4TOTAL", "FREET4", "T3FREE", "THYROIDAB" in the last 72 hours. Lipid Profile: No results for input(s): "CHOL", "HDL", "LDLCALC", "TRIG", "CHOLHDL", "LDLDIRECT" in the last 72 hours. Anemia Panel: No results for input(s): "VITAMINB12", "FOLATE", "FERRITIN", "TIBC", "IRON", "RETICCTPCT" in the last 72 hours. Urine analysis:    Component Value Date/Time   COLORURINE STRAW (A) 08/09/2023 1455   APPEARANCEUR CLEAR  08/09/2023 1455   APPEARANCEUR Cloudy (A) 01/07/2023 1504   LABSPEC 1.008 08/09/2023 1455   PHURINE 6.0 08/09/2023 1455   GLUCOSEU NEGATIVE 08/09/2023 1455   HGBUR NEGATIVE 08/09/2023 1455   BILIRUBINUR NEGATIVE 08/09/2023 1455   BILIRUBINUR Negative 01/07/2023 1504   KETONESUR NEGATIVE 08/09/2023 1455   PROTEINUR 30 (A) 08/09/2023 1455   UROBILINOGEN 1.0 02/10/2012 0817   NITRITE NEGATIVE 08/09/2023 1455   LEUKOCYTESUR NEGATIVE 08/09/2023 1455   Sepsis Labs: Invalid input(s): "PROCALCITONIN", "LACTICIDVEN"  Microbiology: No results found for this or any previous visit (from the past 240 hours).  Radiology Studies: DG Knee 1-2 Views Right Result Date: 11/01/2023 CLINICAL DATA:  Right knee pain after fall. EXAM: RIGHT KNEE - 1-2 VIEW COMPARISON:  None Available. FINDINGS: No evidence of fracture, dislocation, or joint effusion. No evidence of arthropathy or other focal bone abnormality. Soft  tissues are unremarkable. IMPRESSION: Negative. Electronically Signed   By: Rosalene Colon M.D.   On: 11/01/2023 11:10      Jules Baty T. Rosealee Recinos [facility]  If 7PM-7AM, please contact night-coverage [email or web address] 11/01/2023, 11:39 AM

## 2023-11-01 NOTE — Interval H&P Note (Signed)
 History and Physical Interval Note:  11/01/2023 1:48 PM  Erica Crosby  has presented today for surgery, with the diagnosis of right hip pain.  The various methods of treatment have been discussed with the patient and family. After consideration of risks, benefits and other options for treatment, the patient has consented to  Procedure(s): FIXATION, FRACTURE, INTERTROCHANTERIC, WITH INTRAMEDULLARY ROD (Right) as a surgical intervention.  The patient's history has been reviewed, patient examined, no change in status, stable for surgery.  I have reviewed the patient's chart and labs.  Questions were answered to the patient's satisfaction.     Laquita Harlan P Sherlie Boyum

## 2023-11-02 DIAGNOSIS — E1140 Type 2 diabetes mellitus with diabetic neuropathy, unspecified: Secondary | ICD-10-CM | POA: Diagnosis not present

## 2023-11-02 DIAGNOSIS — E039 Hypothyroidism, unspecified: Secondary | ICD-10-CM | POA: Diagnosis not present

## 2023-11-02 DIAGNOSIS — N186 End stage renal disease: Secondary | ICD-10-CM | POA: Diagnosis not present

## 2023-11-02 DIAGNOSIS — S72001A Fracture of unspecified part of neck of right femur, initial encounter for closed fracture: Secondary | ICD-10-CM | POA: Diagnosis not present

## 2023-11-02 DIAGNOSIS — Z7189 Other specified counseling: Secondary | ICD-10-CM

## 2023-11-02 LAB — RENAL FUNCTION PANEL
Albumin: 2 g/dL — ABNORMAL LOW (ref 3.5–5.0)
Anion gap: 13 (ref 5–15)
BUN: 53 mg/dL — ABNORMAL HIGH (ref 8–23)
CO2: 20 mmol/L — ABNORMAL LOW (ref 22–32)
Calcium: 7.3 mg/dL — ABNORMAL LOW (ref 8.9–10.3)
Chloride: 103 mmol/L (ref 98–111)
Creatinine, Ser: 4.57 mg/dL — ABNORMAL HIGH (ref 0.44–1.00)
GFR, Estimated: 9 mL/min — ABNORMAL LOW (ref >60)
Glucose, Bld: 145 mg/dL — ABNORMAL HIGH (ref 70–99)
Phosphorus: 5.2 mg/dL — ABNORMAL HIGH (ref 2.5–4.6)
Potassium: 3.4 mmol/L — ABNORMAL LOW (ref 3.5–5.1)
Sodium: 136 mmol/L (ref 135–145)

## 2023-11-02 LAB — CBC
HCT: 22.1 % — ABNORMAL LOW (ref 36.0–46.0)
Hemoglobin: 7.2 g/dL — ABNORMAL LOW (ref 12.0–15.0)
MCH: 29.9 pg (ref 26.0–34.0)
MCHC: 32.6 g/dL (ref 30.0–36.0)
MCV: 91.7 fL (ref 80.0–100.0)
Platelets: 155 10*3/uL (ref 150–400)
RBC: 2.41 MIL/uL — ABNORMAL LOW (ref 3.87–5.11)
RDW: 16.5 % — ABNORMAL HIGH (ref 11.5–15.5)
WBC: 8.4 10*3/uL (ref 4.0–10.5)
nRBC: 0 % (ref 0.0–0.2)

## 2023-11-02 LAB — GLUCOSE, CAPILLARY
Glucose-Capillary: 102 mg/dL — ABNORMAL HIGH (ref 70–99)
Glucose-Capillary: 104 mg/dL — ABNORMAL HIGH (ref 70–99)
Glucose-Capillary: 106 mg/dL — ABNORMAL HIGH (ref 70–99)
Glucose-Capillary: 125 mg/dL — ABNORMAL HIGH (ref 70–99)
Glucose-Capillary: 162 mg/dL — ABNORMAL HIGH (ref 70–99)
Glucose-Capillary: 187 mg/dL — ABNORMAL HIGH (ref 70–99)

## 2023-11-02 LAB — HEPATITIS A ANTIBODY, TOTAL: hep A Total Ab: REACTIVE — AB

## 2023-11-02 LAB — MAGNESIUM: Magnesium: 1.8 mg/dL (ref 1.7–2.4)

## 2023-11-02 MED ORDER — LIDOCAINE HCL (PF) 1 % IJ SOLN: 5.0000 mL | INTRAMUSCULAR | Status: DC | PRN

## 2023-11-02 MED ORDER — PENTAFLUOROPROP-TETRAFLUOROETH EX AERO: 1.0000 | INHALATION_SPRAY | CUTANEOUS | Status: DC | PRN

## 2023-11-02 MED ORDER — NEPRO/CARBSTEADY PO LIQD: 237.0000 mL | ORAL | Status: DC | PRN

## 2023-11-02 MED ORDER — NEPRO/CARBSTEADY PO LIQD
237.0000 mL | Freq: Two times a day (BID) | ORAL | Status: DC
Start: 2023-11-02 — End: 2023-11-07
  Administered 2023-11-02 – 2023-11-06 (×8): 237 mL via ORAL

## 2023-11-02 MED ORDER — ALTEPLASE 2 MG IJ SOLR: 2.0000 mg | Freq: Once | INTRAMUSCULAR | Status: DC | PRN

## 2023-11-02 MED ORDER — ANTICOAGULANT SODIUM CITRATE 4% (200MG/5ML) IV SOLN: 5.0000 mL | Status: DC | PRN

## 2023-11-02 MED ORDER — LIDOCAINE-PRILOCAINE 2.5-2.5 % EX CREA: 1.0000 | TOPICAL_CREAM | CUTANEOUS | Status: DC | PRN

## 2023-11-02 MED ORDER — HEPARIN SODIUM (PORCINE) 1000 UNIT/ML DIALYSIS
1000.0000 [IU] | INTRAMUSCULAR | Status: DC | PRN
  Administered 2023-11-02: 1000 [IU]

## 2023-11-02 NOTE — Anesthesia Postprocedure Evaluation (Signed)
 Anesthesia Post Note  Patient: Erica Crosby  Procedure(s) Performed: FIXATION, FRACTURE, INTERTROCHANTERIC, WITH INTRAMEDULLARY ROD (Right)     Patient location during evaluation: PACU Anesthesia Type: General Level of consciousness: awake and alert Pain management: pain level controlled Vital Signs Assessment: post-procedure vital signs reviewed and stable Respiratory status: spontaneous breathing, nonlabored ventilation and respiratory function stable Cardiovascular status: blood pressure returned to baseline Postop Assessment: no apparent nausea or vomiting Anesthetic complications: no   No notable events documented.        Rayfield Cairo

## 2023-11-02 NOTE — Progress Notes (Signed)
 PROGRESS NOTE  Erica Crosby EAV:[id number] DOB: December 21, 1945   PCP: Eliodoro Guerin, DO  Patient is from: Home.   DOA: 10/31/2023 LOS: 2  Chief complaints Chief Complaint  Patient presents with   Fall   Hip Pain     Brief Narrative / Interim history: 78 year old F with PMH of ESRD on HD TTS, DM-2 with neuropathy, HTN, HLD, GERD, osteoporosis and malnutrition brought to [facility] due to right hip pain after she sustained a fall at home and admitted with right femoral fracture.  Reportedly fell when she was trying to get up to get some snack for low blood sugar.  No prodromes leading to this.  Did not strike head.  No LOC.  In ED, slightly hypertensive.  Labs without significant acute finding.  Hip x-ray showed severely angulated intertrochanteric fracture of proximal right femur.  Right shoulder, chest and right knee x-ray without acute finding.  CT head without acute finding.  Orthopedic surgery consulted and patient was transferred here.  Patient underwent cephalomedullary nailing of right intertrochanteric femur fracture by Dr. Curtiss Dowdy on 5/16.  Hgb dropped about 2 g postoperatively.  Reported EBL 100 cc.  Therapy recommended SNF.  Subjective: Seen and examined earlier this afternoon after she returned from HD.  Sleepy but wakes to voice.  Oriented to self, place, family (children's name) and months.  Follows commands.  Denies chest pain or shortness of breath.  Reports pain in the right leg.  When asked about CODE STATUS, she asked me to talk to the daughter, Nadine Aus.   Objective: Vitals:   11/02/23 1100 11/02/23 1130 11/02/23 1200 11/02/23 1427  BP: (!) 96/51 (!) 87/48 (!) 106/48 (!) 118/42  Pulse: 80 83  85  Resp: 18 13  16   Temp:    98.1 F (36.7 C)  TempSrc:    Oral  SpO2: 100% 100%  97%  Weight:      Height:        Examination:  GENERAL: No apparent distress.  Appears frail. HEENT: MMM.  Vision and hearing grossly intact.  NECK: Supple.  No apparent JVD.  RESP:  No  IWOB.  Fair aeration bilaterally. CVS:  RRR. Heart sounds normal.  ABD/GI/GU: BS+. Abd soft, NTND.  MSK/EXT:  Moves left extremity.  Tender over right thigh/hip.  Dressing DCI.  No signs of hematoma or ecchymosis. SKIN: As above. NEURO: Sleepy but wakes to voice.  Oriented to self, family, place and months.  Follows commands.  No apparent focal neuro deficit. PSYCH: Calm. Normal affect.  Consultants:  Orthopedic surgery  Procedures: 5/16-cephalomedullary nailing of right intertrochanteric femur fracture by Dr. Curtiss Dowdy  Microbiology summarized: None  Assessment and plan: Accidental fall at home: [facility] when she was trying to walk to get something to eat for low glucose. Closed right proximal femur fracture due to accidental fall and osteoporosis History of osteoporosis -S/p cephalomedullary nailing of right intertrochanteric femur fracture by Dr. Curtiss Dowdy on 5/16 -Vitamin D  level within normal. -Pain control, VTE prophylaxis per orthopedic surgery - Bowel regimen - PT/OT-recommended SNF  ESRD on HD TTS: Last HD on 5/13.  No emergent need but morning labs pending -Nephrology on board   Essential hypertension: Soft BP today. - Continue holding home amlodipine   NIDDM-2 with hyperglycemia: A1c 5.7% (6.3% in 05/2023) Recent Labs  Lab 11/01/23 2013 11/01/23 2332 11/02/23 0426 11/02/23 0810 11/02/23 1431  GLUCAP 183* 206* 187* 162* 106*  -SSI-very sensitive  Anemia of renal disease/possible postoperative blood loss anemia: Hgb dropped  about 2 g postoperatively.  Reported EBL 100 cc.  No signs of overt bleeding. Recent Labs    11/22/22 1343 12/06/22 1337 12/19/22 1326 01/03/23 1313 08/09/23 1139 09/04/23 0921 10/31/23 1032 11/01/23 1058 11/01/23 1407 11/02/23 0625  HGB 11.7* 12.2 12.4 12.2 10.4* 11.9* 9.6* 9.5* 9.9* 7.2*  - Recheck CBC this afternoon - Transfuse if Hgb less than 7.0.  Daughter, Nadine Aus gave verbal consent.  Hypokalemia -Per nephrology   GERD -  Continue pantoprazole   Advance care planning: Very frail patient with significant comorbidity as above.  She is awake and oriented to self, place, family and months but not quite alert.  She told me to talk to her daughter, "Nadine Aus" when I asked about CODE STATUS.  I called Nedra and gave her update.  She works in Teacher, music.  We discussed about anemia and potential blood transfusion. Nedra gave verbal consent.  We also discussed about CODE STATUS including pros and cons of CPR and intubation in light of failure, debility and frailty. Nadine Aus said she has discussion with her in in the past when "she was not this sick".  At that time, "she did not want".  However, Nadine Aus likes to discuss with her other children/sons before making this decision.  Patient remains full code until further update from family.   Increased nutrient needs Body mass index is 16.46 kg/m. Nutrition Problem: Increased nutrient needs Etiology: chronic illness, post-op healing Signs/Symptoms: estimated needs Interventions: Refer to RD note for recommendations, Nepro shake   DVT prophylaxis:  SCDs Start: 11/01/23 1649 Full dose aspirin  Code Status: Full code-discussed with daughter Family Communication: Updated patient's daughter, Nadine Aus over the phone Level of care: Med-Surg Status is: Inpatient Remains inpatient appropriate because: Right hip fracture   Final disposition: SNF   55 minutes with more than 50% spent in reviewing records, counseling patient/family and coordinating care.   Sch Meds:  Scheduled Meds:  aspirin  EC  325 mg Oral Q breakfast   atorvastatin   10 mg Oral Daily   carvedilol   6.25 mg Oral BID WC   Chlorhexidine  Gluconate Cloth  6 each Topical Q0600   docusate sodium   100 mg Oral BID   feeding supplement (NEPRO CARB STEADY)  237 mL Oral BID BM   folic acid   1 mg Oral Daily   insulin  aspart  0-6 Units Subcutaneous Q4H   pantoprazole   40 mg Oral BID AC   Continuous Infusions:  sodium chloride  40  mL/hr at 11/01/23 1817   PRN Meds:.HYDROcodone -acetaminophen , menthol -cetylpyridinium **OR** phenol, methocarbamol  **OR** methocarbamol  (ROBAXIN ) injection, metoCLOPramide  **OR** metoCLOPramide  (REGLAN ) injection, morphine  injection, ondansetron  **OR** ondansetron  (ZOFRAN ) IV, senna-docusate  Antimicrobials: Anti-infectives (From admission, onward)    Start     Dose/Rate Route Frequency Ordered Stop   11/01/23 1745  ceFAZolin  (ANCEF ) IVPB 2g/100 mL premix  Status:  Discontinued        2 g 200 mL/hr over 30 Minutes Intravenous Every 8 hours 11/01/23 1648 11/01/23 1717   11/01/23 1045  ceFAZolin  (ANCEF ) IVPB 2g/100 mL premix        2 g 200 mL/hr over 30 Minutes Intravenous On call to O.R. 11/01/23 0947 11/01/23 1506        I have personally reviewed the following labs and images: CBC: Recent Labs  Lab 10/31/23 1032 11/01/23 1058 11/01/23 1407 11/02/23 0625  WBC 9.3 7.4  --  8.4  NEUTROABS 7.9*  --   --   --   HGB 9.6* 9.5* 9.9* 7.2*  HCT 29.8* 28.9* 29.0* 22.1*  MCV 92.0 90.6  --  91.7  PLT 168 181  --  155   BMP &GFR Recent Labs  Lab 10/31/23 1032 11/01/23 1407 11/01/23 1730 11/02/23 0625  NA 135 140 140 136  K 3.4* 3.8 3.8 3.4*  CL 100 102 103 103  CO2 24  --  21* 20*  GLUCOSE 210* 127* 145* 145*  BUN 36* 44* 48* 53*  CREATININE 3.72* 4.50* 4.49* 4.57*  CALCIUM  8.9  --  8.8* 7.3*  MG  --   --   --  1.8  PHOS  --   --  5.6* 5.2*   Estimated Creatinine Clearance: 7.7 mL/min (A) (by C-G formula based on SCr of 4.57 mg/dL (H)). Liver & Pancreas: Recent Labs  Lab 10/31/23 1032 11/01/23 1730 11/02/23 0625  AST 24  --   --   ALT 16  --   --   ALKPHOS 64  --   --   BILITOT 0.9  --   --   PROT 8.0  --   --   ALBUMIN 2.9* 2.7* 2.0*   No results for input(s): "LIPASE", "AMYLASE" in the last 168 hours. No results for input(s): "AMMONIA" in the last 168 hours. Diabetic: Recent Labs    11/01/23 1221  HGBA1C 5.7*   Recent Labs  Lab 11/01/23 2013  11/01/23 2332 11/02/23 0426 11/02/23 0810 11/02/23 1431  GLUCAP 183* 206* 187* 162* 106*   Cardiac Enzymes: No results for input(s): "CKTOTAL", "CKMB", "CKMBINDEX", "TROPONINI" in the last 168 hours. No results for input(s): "PROBNP" in the last 8760 hours. Coagulation Profile: No results for input(s): "INR", "PROTIME" in the last 168 hours. Thyroid  Function Tests: Recent Labs    11/01/23 1221  TSH 0.560   Lipid Profile: No results for input(s): "CHOL", "HDL", "LDLCALC", "TRIG", "CHOLHDL", "LDLDIRECT" in the last 72 hours. Anemia Panel: No results for input(s): "VITAMINB12", "FOLATE", "FERRITIN", "TIBC", "IRON", "RETICCTPCT" in the last 72 hours. Urine analysis:    Component Value Date/Time   COLORURINE STRAW (A) 08/09/2023 1455   APPEARANCEUR CLEAR 08/09/2023 1455   APPEARANCEUR Cloudy (A) 01/07/2023 1504   LABSPEC 1.008 08/09/2023 1455   PHURINE 6.0 08/09/2023 1455   GLUCOSEU NEGATIVE 08/09/2023 1455   HGBUR NEGATIVE 08/09/2023 1455   BILIRUBINUR NEGATIVE 08/09/2023 1455   BILIRUBINUR Negative 01/07/2023 1504   KETONESUR NEGATIVE 08/09/2023 1455   PROTEINUR 30 (A) 08/09/2023 1455   UROBILINOGEN 1.0 02/10/2012 0817   NITRITE NEGATIVE 08/09/2023 1455   LEUKOCYTESUR NEGATIVE 08/09/2023 1455   Sepsis Labs: Invalid input(s): "PROCALCITONIN", "LACTICIDVEN"  Microbiology: Recent Results (from the past 240 hours)  Surgical pcr screen     Status: Abnormal   Collection Time: 11/01/23 11:38 AM   Specimen: Nasal Mucosa; Nasal Swab  Result Value Ref Range Status   MRSA, PCR NEGATIVE NEGATIVE Final   Staphylococcus aureus POSITIVE (A) NEGATIVE Final    Comment: (NOTE) The Xpert SA Assay (FDA approved for NASAL specimens in patients 54 years of age and older), is one component of a comprehensive surveillance program. It is not intended to diagnose infection nor to guide or monitor treatment. Performed at [facility], [address], [place], [place] [address]      Radiology Studies: DG HIP PORT Luis Sailors OR W/O PELVIS 1V RIGHT Result Date: 11/01/2023 CLINICAL DATA:  [id number] Fracture [id number] EXAM: DG HIP (WITH OR WITHOUT PELVIS) 1V PORT RIGHT COMPARISON:  Oct 31, 2023 FINDINGS: Osteopenia. Status post intramedullary rod fixation of the RIGHT proximal femur. Orthopedic  hardware is intact and without periprosthetic fracture or lucency. Improved alignment of a inter trochanteric RIGHT femoral fracture. No new fractures identified. Atherosclerotic calcifications. Soft tissue air. Sacrum is obscured by overlapping bowel contents. IMPRESSION: Expected postsurgical appearance status post intramedullary rod fixation of the RIGHT femur. Electronically Signed   By: Clancy Crimes M.D.   On: 11/01/2023 16:33      Bernadene Garside T. Dailen Mcclish [facility]  If 7PM-7AM, please contact night-coverage [email or web address] 11/02/2023, 4:09 PM

## 2023-11-02 NOTE — Evaluation (Signed)
 Physical Therapy Evaluation Patient Details Name: Erica Crosby MRN: [id number] DOB: Nov 03, 1945 Today's Date: 11/02/2023  History of Present Illness  Pt is a 78 y.o. female admitted 5/15 following a fall at home. Imaging revealed R hip fx. She underwent IMN 5/16. PMH: ESRD on HD (TTS), HTN, T2DM, HLD, GERD.  Clinical Impression  Pt admitted with above diagnosis. PTA pt lived at home with family, mod I household mobility with cane. Pt currently with functional limitations due to the deficits listed below (see PT Problem List). On eval, pt required max assist bed mobility and demo poor sitting balance. Unable to tolerate > 30 seconds sitting EOB due to pain and dizziness. Pt will benefit from acute skilled PT to increase their independence and safety with mobility to allow discharge. Post acute, pt would benefit from further therapy in inpatient setting < 3 hours/day.          If plan is discharge home, recommend the following: Two people to help with walking and/or transfers;Two people to help with bathing/dressing/bathroom;Assistance with cooking/housework;Assist for transportation;Help with stairs or ramp for entrance   Can travel by private vehicle   No    Equipment Recommendations Rolling walker (2 wheels);Wheelchair (measurements PT);Wheelchair cushion (measurements PT);Hospital bed  Recommendations for Other Services       Functional Status Assessment Patient has had a recent decline in their functional status and demonstrates the ability to make significant improvements in function in a reasonable and predictable amount of time.     Precautions / Restrictions Precautions Precautions: Fall Recall of Precautions/Restrictions: Impaired Restrictions Weight Bearing Restrictions Per Provider Order: Yes RLE Weight Bearing Per Provider Order: Weight bearing as tolerated      Mobility  Bed Mobility Overal bed mobility: Needs Assistance Bed Mobility: Supine to Sit, Sit to Supine      Supine to sit: Max assist, HOB elevated Sit to supine: Max assist, HOB elevated   General bed mobility comments: assist with BLE and trunk    Transfers                   General transfer comment: unable to progress beyond EOB due to pain and dizziness    Ambulation/Gait                  Stairs            Wheelchair Mobility     Tilt Bed    Modified Rankin (Stroke Patients Only)       Balance Overall balance assessment: Needs assistance Sitting-balance support: Feet unsupported, Bilateral upper extremity supported Sitting balance-Leahy Scale: Poor   Postural control: Posterior lean                                   Pertinent Vitals/Pain Pain Assessment Pain Assessment: Faces Faces Pain Scale: Hurts even more Pain Location: RLE Pain Descriptors / Indicators: Grimacing, Guarding, Moaning Pain Intervention(s): Limited activity within patient's tolerance, Monitored during session, Repositioned, Ice applied    Home Living Family/patient expects to be discharged to:: Private residence Living Arrangements: Children Available Help at Discharge: Family;Available PRN/intermittently Type of Home: Mobile home Home Access: Stairs to enter Entrance Stairs-Rails: Right;Left Entrance Stairs-Number of Steps: 4   Home Layout: One level Home Equipment: Cane - single point      Prior Function Prior Level of Function : Needs assist  Mobility Comments: amb household distances with cane ADLs Comments: medical transport to/from HD, family assists with iADLs     Extremity/Trunk Assessment   Upper Extremity Assessment Upper Extremity Assessment: Defer to OT evaluation    Lower Extremity Assessment Lower Extremity Assessment: Generalized weakness;RLE deficits/detail RLE Deficits / Details: hip fx s/p IMN    Cervical / Trunk Assessment Cervical / Trunk Assessment: Kyphotic  Communication    Communication Communication: No apparent difficulties    Cognition Arousal: Alert Behavior During Therapy: Flat affect   PT - Cognitive impairments: No family/caregiver present to determine baseline, Orientation, Awareness, Memory, Sequencing, Initiation, Problem solving, Safety/Judgement   Orientation impairments: Time                     Following commands: Impaired Following commands impaired: Follows one step commands inconsistently, Follows one step commands with increased time     Cueing Cueing Techniques: Verbal cues, Gestural cues, Tactile cues     General Comments      Exercises     Assessment/Plan    PT Assessment Patient needs continued PT services  PT Problem List Decreased strength;Decreased balance;Decreased cognition;Decreased knowledge of precautions;Pain;Decreased mobility;Decreased knowledge of use of DME;Decreased activity tolerance;Decreased safety awareness       PT Treatment Interventions DME instruction;Functional mobility training;Balance training;Patient/family education;Gait training;Therapeutic activities;Therapeutic exercise    PT Goals (Current goals can be found in the Care Plan section)  Acute Rehab PT Goals Patient Stated Goal: not stated PT Goal Formulation: With patient Time For Goal Achievement: 11/16/23 Potential to Achieve Goals: Good    Frequency Min 2X/week     Co-evaluation               AM-PAC PT "6 Clicks" Mobility  Outcome Measure Help needed turning from your back to your side while in a flat bed without using bedrails?: Total Help needed moving from lying on your back to sitting on the side of a flat bed without using bedrails?: Total Help needed moving to and from a bed to a chair (including a wheelchair)?: Total Help needed standing up from a chair using your arms (e.g., wheelchair or bedside chair)?: Total Help needed to walk in hospital room?: Total Help needed climbing 3-5 steps with a railing? :  Total 6 Click Score: 6    End of Session   Activity Tolerance: Patient limited by pain;Treatment limited secondary to medical complications (Comment) (dizziness) Patient left: in bed;with call bell/phone within reach;with bed alarm set Nurse Communication: Mobility status PT Visit Diagnosis: Muscle weakness (generalized) (M62.81);Difficulty in walking, not elsewhere classified (R26.2);Pain Pain - Right/Left: Right Pain - part of body: Hip    Time: 0806-0826 PT Time Calculation (min) (ACUTE ONLY): 20 min   Charges:   PT Evaluation $PT Eval Moderate Complexity: 1 Mod   PT General Charges $$ ACUTE PT VISIT: 1 Visit         Dorothye Gathers., PT  Office # [phone]   Guadelupe Leech 11/02/2023, 9:00 AM

## 2023-11-02 NOTE — Plan of Care (Signed)
  Problem: Education: Goal: Knowledge of General Education information will improve Description: Including pain rating scale, medication(s)/side effects and non-pharmacologic comfort measures Outcome: Progressing   Problem: Health Behavior/Discharge Planning: Goal: Ability to manage health-related needs will improve Outcome: Progressing   Problem: Clinical Measurements: Goal: Ability to maintain clinical measurements within normal limits will improve Outcome: Progressing Goal: Will remain free from infection Outcome: Progressing Goal: Diagnostic test results will improve Outcome: Progressing Goal: Respiratory complications will improve Outcome: Progressing Goal: Cardiovascular complication will be avoided Outcome: Progressing   Problem: Activity: Goal: Risk for activity intolerance will decrease Outcome: Progressing   Problem: Nutrition: Goal: Adequate nutrition will be maintained Outcome: Progressing   Problem: Coping: Goal: Level of anxiety will decrease Outcome: Progressing   Problem: Elimination: Goal: Will not experience complications related to bowel motility Outcome: Progressing Goal: Will not experience complications related to urinary retention Outcome: Progressing   Problem: Pain Managment: Goal: General experience of comfort will improve and/or be controlled Outcome: Progressing   Problem: Safety: Goal: Ability to remain free from injury will improve Outcome: Progressing   Problem: Skin Integrity: Goal: Risk for impaired skin integrity will decrease Outcome: Progressing   Problem: Education: Goal: Ability to describe self-care measures that may prevent or decrease complications (Diabetes Survival Skills Education) will improve Outcome: Progressing Goal: Individualized Educational Video(s) Outcome: Progressing   Problem: Coping: Goal: Ability to adjust to condition or change in health will improve Outcome: Progressing   Problem: Fluid  Volume: Goal: Ability to maintain a balanced intake and output will improve Outcome: Progressing   Problem: Health Behavior/Discharge Planning: Goal: Ability to identify and utilize available resources and services will improve Outcome: Progressing Goal: Ability to manage health-related needs will improve Outcome: Progressing   Problem: Metabolic: Goal: Ability to maintain appropriate glucose levels will improve Outcome: Progressing   Problem: Nutritional: Goal: Maintenance of adequate nutrition will improve Outcome: Progressing Goal: Progress toward achieving an optimal weight will improve Outcome: Progressing   Problem: Skin Integrity: Goal: Risk for impaired skin integrity will decrease Outcome: Progressing   Problem: Tissue Perfusion: Goal: Adequacy of tissue perfusion will improve Outcome: Progressing   Problem: Education: Goal: Verbalization of understanding the information provided (i.e., activity precautions, restrictions, etc) will improve Outcome: Progressing Goal: Individualized Educational Video(s) Outcome: Progressing   Problem: Activity: Goal: Ability to ambulate and perform ADLs will improve Outcome: Progressing   Problem: Clinical Measurements: Goal: Postoperative complications will be avoided or minimized Outcome: Progressing   Problem: Self-Concept: Goal: Ability to maintain and perform role responsibilities to the fullest extent possible will improve Outcome: Progressing   Problem: Pain Management: Goal: Pain level will decrease Outcome: Progressing

## 2023-11-02 NOTE — Progress Notes (Signed)
 Received patient in bed to unit. [phone] Alert and oriented. A & O x4 Informed consent signed and in chart. Yes  TX duration:3 hours  Patient tolerated well. B/P ran low throughout . 500 of N/S administered  Transported back to the room via bed Alert, without acute distress.  Hand-off given to patient's nurse. AndreaRN  Access used: Right CVC Access issues: None  Total UF removed: -0.0 Medication(s) given: None Post HD weight: 50.0 kg Post HD VS: 108/53, MAP 66,  10 Resp, 100 % on RA, 88 Pulse,    Leva Baine S Greenly Rarick RN, DNP [facility]

## 2023-11-02 NOTE — Progress Notes (Signed)
 Subjective: 1 Day Post-Op s/p Procedure(s): FIXATION, FRACTURE, INTERTROCHANTERIC, WITH INTRAMEDULLARY ROD   Patient is somnolent, but able to respond to commands. Denies pain at rest. Seen on HD floor, no complaints at this time.   Objective:  PE: VITALS:   Vitals:   11/02/23 0808 11/02/23 0808 11/02/23 0850 11/02/23 0857  BP: (!) 114/46 (!) 102/38 (!) 107/53 (!) 109/51  Pulse: 82 82 86 76  Resp:   13 12  Temp: 97.7 F (36.5 C) 97.7 F (36.5 C) 98.1 F (36.7 C)   TempSrc: Oral     SpO2: 100% 100% 100% 100%  Weight:      Height:        Sensation intact distally Intact pulses distally Dorsiflexion/Plantar flexion intact Incision: dressing C/D/I  LABS  Results for orders placed or performed during the hospital encounter of 10/31/23 (from the past 24 hours)  CBC     Status: Abnormal   Collection Time: 11/01/23 10:58 AM  Result Value Ref Range   WBC 7.4 4.0 - 10.5 K/uL   RBC 3.19 (L) 3.87 - 5.11 MIL/uL   Hemoglobin 9.5 (L) 12.0 - 15.0 g/dL   HCT 16.1 (L) 09.6 - 04.5 %   MCV 90.6 80.0 - 100.0 fL   MCH 29.8 26.0 - 34.0 pg   MCHC 32.9 30.0 - 36.0 g/dL   RDW 40.9 (H) 81.1 - 91.4 %   Platelets 181 150 - 400 K/uL   nRBC 0.0 0.0 - 0.2 %  Glucose, capillary     Status: Abnormal   Collection Time: 11/01/23 11:32 AM  Result Value Ref Range   Glucose-Capillary 123 (H) 70 - 99 mg/dL  Surgical pcr screen     Status: Abnormal   Collection Time: 11/01/23 11:38 AM   Specimen: Nasal Mucosa; Nasal Swab  Result Value Ref Range   MRSA, PCR NEGATIVE NEGATIVE   Staphylococcus aureus POSITIVE (A) NEGATIVE  Hemoglobin A1c     Status: Abnormal   Collection Time: 11/01/23 12:21 PM  Result Value Ref Range   Hgb A1c MFr Bld 5.7 (H) 4.8 - 5.6 %   Mean Plasma Glucose 116.89 mg/dL  VITAMIN D  25 Hydroxy (Vit-D Deficiency, Fractures)     Status: None   Collection Time: 11/01/23 12:21 PM  Result Value Ref Range   Vit D, 25-Hydroxy 92.61 30 - 100 ng/mL  TSH     Status: None    Collection Time: 11/01/23 12:21 PM  Result Value Ref Range   TSH 0.560 0.350 - 4.500 uIU/mL  Glucose, capillary     Status: Abnormal   Collection Time: 11/01/23  1:34 PM  Result Value Ref Range   Glucose-Capillary 128 (H) 70 - 99 mg/dL   Comment 1 Notify RN    Comment 2 Document in Chart   I-STAT, chem 8     Status: Abnormal   Collection Time: 11/01/23  2:07 PM  Result Value Ref Range   Sodium 140 135 - 145 mmol/L   Potassium 3.8 3.5 - 5.1 mmol/L   Chloride 102 98 - 111 mmol/L   BUN 44 (H) 8 - 23 mg/dL   Creatinine, Ser 7.82 (H) 0.44 - 1.00 mg/dL   Glucose, Bld 956 (H) 70 - 99 mg/dL   Calcium , Ion 1.12 (L) 1.15 - 1.40 mmol/L   TCO2 25 22 - 32 mmol/L   Hemoglobin 9.9 (L) 12.0 - 15.0 g/dL   HCT 21.3 (L) 08.6 - 57.8 %  Glucose, capillary  Status: Abnormal   Collection Time: 11/01/23  3:58 PM  Result Value Ref Range   Glucose-Capillary 136 (H) 70 - 99 mg/dL  Renal function panel     Status: Abnormal   Collection Time: 11/01/23  5:30 PM  Result Value Ref Range   Sodium 140 135 - 145 mmol/L   Potassium 3.8 3.5 - 5.1 mmol/L   Chloride 103 98 - 111 mmol/L   CO2 21 (L) 22 - 32 mmol/L   Glucose, Bld 145 (H) 70 - 99 mg/dL   BUN 48 (H) 8 - 23 mg/dL   Creatinine, Ser 1.61 (H) 0.44 - 1.00 mg/dL   Calcium  8.8 (L) 8.9 - 10.3 mg/dL   Phosphorus 5.6 (H) 2.5 - 4.6 mg/dL   Albumin 2.7 (L) 3.5 - 5.0 g/dL   GFR, Estimated 10 (L) >60 mL/min   Anion gap 16 (H) 5 - 15  Glucose, capillary     Status: Abnormal   Collection Time: 11/01/23  6:27 PM  Result Value Ref Range   Glucose-Capillary 120 (H) 70 - 99 mg/dL  Glucose, capillary     Status: Abnormal   Collection Time: 11/01/23  8:13 PM  Result Value Ref Range   Glucose-Capillary 183 (H) 70 - 99 mg/dL  Glucose, capillary     Status: Abnormal   Collection Time: 11/01/23 11:32 PM  Result Value Ref Range   Glucose-Capillary 206 (H) 70 - 99 mg/dL  Glucose, capillary     Status: Abnormal   Collection Time: 11/02/23  4:26 AM  Result Value  Ref Range   Glucose-Capillary 187 (H) 70 - 99 mg/dL  Renal function panel     Status: Abnormal   Collection Time: 11/02/23  6:25 AM  Result Value Ref Range   Sodium 136 135 - 145 mmol/L   Potassium 3.4 (L) 3.5 - 5.1 mmol/L   Chloride 103 98 - 111 mmol/L   CO2 20 (L) 22 - 32 mmol/L   Glucose, Bld 145 (H) 70 - 99 mg/dL   BUN 53 (H) 8 - 23 mg/dL   Creatinine, Ser 0.96 (H) 0.44 - 1.00 mg/dL   Calcium  7.3 (L) 8.9 - 10.3 mg/dL   Phosphorus 5.2 (H) 2.5 - 4.6 mg/dL   Albumin 2.0 (L) 3.5 - 5.0 g/dL   GFR, Estimated 9 (L) >60 mL/min   Anion gap 13 5 - 15  Magnesium      Status: None   Collection Time: 11/02/23  6:25 AM  Result Value Ref Range   Magnesium  1.8 1.7 - 2.4 mg/dL  CBC     Status: Abnormal   Collection Time: 11/02/23  6:25 AM  Result Value Ref Range   WBC 8.4 4.0 - 10.5 K/uL   RBC 2.41 (L) 3.87 - 5.11 MIL/uL   Hemoglobin 7.2 (L) 12.0 - 15.0 g/dL   HCT 04.5 (L) 40.9 - 81.1 %   MCV 91.7 80.0 - 100.0 fL   MCH 29.9 26.0 - 34.0 pg   MCHC 32.6 30.0 - 36.0 g/dL   RDW 91.4 (H) 78.2 - 95.6 %   Platelets 155 150 - 400 K/uL   nRBC 0.0 0.0 - 0.2 %  Glucose, capillary     Status: Abnormal   Collection Time: 11/02/23  8:10 AM  Result Value Ref Range   Glucose-Capillary 162 (H) 70 - 99 mg/dL    DG HIP PORT UNILAT W OR W/O PELVIS 1V RIGHT Result Date: 11/01/2023 CLINICAL DATA:  [id number] Fracture [id number] EXAM: DG HIP (WITH OR WITHOUT PELVIS)  1V PORT RIGHT COMPARISON:  Oct 31, 2023 FINDINGS: Osteopenia. Status post intramedullary rod fixation of the RIGHT proximal femur. Orthopedic hardware is intact and without periprosthetic fracture or lucency. Improved alignment of a inter trochanteric RIGHT femoral fracture. No new fractures identified. Atherosclerotic calcifications. Soft tissue air. Sacrum is obscured by overlapping bowel contents. IMPRESSION: Expected postsurgical appearance status post intramedullary rod fixation of the RIGHT femur. Electronically Signed   By: Clancy Crimes M.D.   On:  11/01/2023 16:33   DG FEMUR, MIN 2 VIEWS RIGHT Result Date: 11/01/2023 CLINICAL DATA:  [id number] Elective surgery [id number] EXAM: RIGHT FEMUR 2 VIEWS COMPARISON:  Oct 31, 2023 FINDINGS: Spot fluoroscopy images were obtained for surgical planning purposes. These demonstrate patient is status post intramedullary rod fixation of the RIGHT proximal femur. Improved alignment of a inter trochanteric fracture. Time: 52 seconds Dose: 4.76 mGy Please reference procedure report for further details. IMPRESSION: Fluoroscopic images for surgical planning purposes. Electronically Signed   By: Clancy Crimes M.D.   On: 11/01/2023 16:31   DG C-Arm 1-60 Min-No Report Result Date: 11/01/2023 Fluoroscopy was utilized by the requesting physician.  No radiographic interpretation.   DG Knee 1-2 Views Right Result Date: 11/01/2023 CLINICAL DATA:  Right knee pain after fall. EXAM: RIGHT KNEE - 1-2 VIEW COMPARISON:  None Available. FINDINGS: No evidence of fracture, dislocation, or joint effusion. No evidence of arthropathy or other focal bone abnormality. Soft tissues are unremarkable. IMPRESSION: Negative. Electronically Signed   By: Rosalene Colon M.D.   On: 11/01/2023 11:10   CT Head Wo Contrast Result Date: 10/31/2023 CLINICAL DATA:  Head trauma, minor (Age >= 65y) EXAM: CT HEAD WITHOUT CONTRAST TECHNIQUE: Contiguous axial images were obtained from the base of the skull through the vertex without intravenous contrast. RADIATION DOSE REDUCTION: This exam was performed according to the departmental dose-optimization program which includes automated exposure control, adjustment of the mA and/or kV according to patient size and/or use of iterative reconstruction technique. COMPARISON:  March 08, 2024, May 05, 2021 FINDINGS: Brain: Proportional prominence of the ventricles and sulci, consistent with diffuse cerebral parenchymal volume loss. The ventricles otherwise maintained midline position without midline shift.  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 The basilar cisterns are patent without downward herniation. The cerebellar hemispheres and vermis are well formed without mass lesion or focal attenuation abnormality. Vascular: No hyperdense vessel. Calcified atherosclerotic plaque within the cavernous/supraclinoid ICA and intradural vertebral arteries. Skull: Normal. Negative for fracture or focal lesion. Sinuses/Orbits: The paranasal sinuses and mastoids are clear. The globes appear intact. No retrobulbar hematoma. Other: None. IMPRESSION: 1. No acute intracranial abnormality, specifically, no acute hemorrhage, territorial infarction, or intracranial mass. 2. Cerebral volume loss with redemonstrated findings of chronic ischemic microvascular disease. Electronically Signed   By: Rance Burrows M.D.   On: 10/31/2023 11:07   DG Shoulder Right Result Date: 10/31/2023 CLINICAL DATA:  Right shoulder pain after fall today. EXAM: RIGHT SHOULDER - 2+ VIEW COMPARISON:  None Available. FINDINGS: There is no evidence of fracture or dislocation. Mild degenerative changes seen involving the right acromioclavicular joint. Soft tissues are unremarkable. IMPRESSION: No acute abnormality seen. Electronically Signed   By: Rosalene Colon M.D.   On: 10/31/2023 10:48   DG Chest Port 1 View Result Date: 10/31/2023 CLINICAL DATA:  Fall today. EXAM: PORTABLE CHEST 1 VIEW COMPARISON:  September 04, 2023. FINDINGS: The heart size and mediastinal contours are within normal limits. Right internal jugular dialysis catheter is unchanged. Both lungs are clear. The visualized skeletal structures are unremarkable. IMPRESSION: No active disease. Electronically Signed   By: Rosalene Colon M.D.   On:  10/31/2023 10:47   DG Hip Unilat W or Wo Pelvis 2-3 Views Right Result Date: 10/31/2023 CLINICAL DATA:  Right hip deformity after fall today. EXAM: DG HIP (WITH OR WITHOUT PELVIS) 2-3V RIGHT COMPARISON:  July 06, 2022. FINDINGS: Severely angulated and probably comminuted fracture of intertrochanteric region of proximal right femur is noted. IMPRESSION: Severely angulated intertrochanteric fracture of proximal right femur. Electronically Signed   By: Rosalene Colon M.D.   On: 10/31/2023 10:46    Assessment/Plan: Principal Problem:   Closed right hip fracture (HCC) Active Problems:   Type 2 diabetes mellitus without complications (HCC)   Osteoporosis   Gastro-esophageal reflux disease without esophagitis   Anemia in chronic kidney disease   Nonrheumatic mitral valve regurgitation   End stage renal disease (HCC)   Physical deconditioning   Unspecified protein-calorie malnutrition (HCC)   Impaired mobility and ADLs   Hypothyroidism, unspecified   Type 2 diabetes mellitus with diabetic neuropathy, unspecified (HCC)    1 Day Post-Op s/p Procedure(s): FIXATION, FRACTURE, INTERTROCHANTERIC, WITH INTRAMEDULLARY ROD  Weightbearing: WBAT RLE, up with therapy Insicional and dressing care: Reinforce dressings as needed Orthopedic device(s): None VTE prophylaxis: aspirin  Pain control: continue current regimen, limit narcotics as much as possible Follow - up plan: w/ Dr. Curtiss Dowdy Dispo: PT eval complete, will need SNF, TOC on board   Contact information:   Hurshel Maidens, PA-C Weekdays 8-5  After hours and holidays please check [email or web address] for group call information for [facility]  Abraham Hoffmann 11/02/2023, 9:52 AM

## 2023-11-02 NOTE — Progress Notes (Signed)
 Initial Nutrition Assessment  DOCUMENTATION CODES:   Underweight  INTERVENTION:  - Carb Modified diet per MD.  - Nepro Shake po BID, each supplement provides 425 kcal and 19 grams protein - Encourage intake at all meals and of supplements.  - Monitor weight trends.  NUTRITION DIAGNOSIS:   Increased nutrient needs related to chronic illness, post-op healing as evidenced by estimated needs.  GOAL:   Patient will meet greater than or equal to 90% of their needs  MONITOR:   PO intake, Supplement acceptance, Labs, Weight trends  REASON FOR ASSESSMENT:   Consult Assessment of nutrition requirement/status, Hip fracture protocol  ASSESSMENT:   78 y.o female who presents with right femur fracture after a fall. [facility] for [facility]. PMH: ESRD on HD (TTS), HTN, T2DM, HLD, GERD.  RD working remotely. Unable to reach patient via bedside telephone.   Per chart review, patient appears to have had a 13# or 12% weight loss from November to March, which is significant for a 4 month time period. Weight stable since March.  No meal intakes documented since diet advanced after surgery yesterday. Will add ONS and MVI to support intake and healing.    Medications reviewed and include: Colace, 1mg  folic acid   Labs reviewed:  K+ 3.4 Creatinine 4.57 Phosphorus 5.2 HA1C 5.7 Blood Glucose 120-206 x24 hours   NUTRITION - FOCUSED PHYSICAL EXAM:  RD working remotely  Diet Order:   Diet Order             Diet Carb Modified Fluid consistency: Thin; Room service appropriate? Yes  Diet effective now                   EDUCATION NEEDS:  Not appropriate for education at this time  Skin:  Skin Assessment: Skin Integrity Issues: Skin Integrity Issues:: Incisions Incisions: Right leg  Last BM:  PTA  Height:  Ht Readings from Last 1 Encounters:  11/01/23 5\' 6"  (1.676 m)   Weight:  Wt Readings from Last 1 Encounters:  11/01/23 46.3 kg   Ideal Body Weight:  59.1  kg  BMI:  Body mass index is 16.46 kg/m.  Estimated Nutritional Needs:  Kcal:  1400-1600 kcals Protein:  60-75 grams Fluid:  >/= 1.5L    Scheryl Cushing RD, LDN Contact via Secure Chat.

## 2023-11-02 NOTE — Progress Notes (Signed)
 OT Cancellation Note  Patient Details Name: Erica Crosby MRN: [id number] DOB: 1946/06/06   Cancelled Treatment:    Reason Eval/Treat Not Completed: Patient at procedure or test/ unavailable. Pt at HD, OT will follow up next available time  Alfred Ann 11/02/2023, 10:12 AM

## 2023-11-02 NOTE — Procedures (Signed)
 I was present at the procedure, reviewed the HD regimen and made appropriate changes.   Larry Poag MD  [facility] 11/02/2023, 1:56 PM

## 2023-11-03 ENCOUNTER — Other Ambulatory Visit: Payer: Self-pay

## 2023-11-03 DIAGNOSIS — S72001A Fracture of unspecified part of neck of right femur, initial encounter for closed fracture: Secondary | ICD-10-CM | POA: Diagnosis not present

## 2023-11-03 DIAGNOSIS — N186 End stage renal disease: Secondary | ICD-10-CM | POA: Diagnosis not present

## 2023-11-03 DIAGNOSIS — E1140 Type 2 diabetes mellitus with diabetic neuropathy, unspecified: Secondary | ICD-10-CM | POA: Diagnosis not present

## 2023-11-03 DIAGNOSIS — E039 Hypothyroidism, unspecified: Secondary | ICD-10-CM | POA: Diagnosis not present

## 2023-11-03 LAB — RENAL FUNCTION PANEL
Albumin: 2.2 g/dL — ABNORMAL LOW (ref 3.5–5.0)
Anion gap: 10 (ref 5–15)
BUN: 36 mg/dL — ABNORMAL HIGH (ref 8–23)
CO2: 27 mmol/L (ref 22–32)
Calcium: 8 mg/dL — ABNORMAL LOW (ref 8.9–10.3)
Chloride: 96 mmol/L — ABNORMAL LOW (ref 98–111)
Creatinine, Ser: 3.56 mg/dL — ABNORMAL HIGH (ref 0.44–1.00)
GFR, Estimated: 13 mL/min — ABNORMAL LOW (ref >60)
Glucose, Bld: 187 mg/dL — ABNORMAL HIGH (ref 70–99)
Phosphorus: 2.5 mg/dL (ref 2.5–4.6)
Potassium: 3.8 mmol/L (ref 3.5–5.1)
Sodium: 133 mmol/L — ABNORMAL LOW (ref 135–145)

## 2023-11-03 LAB — CBC
HCT: 20.7 % — ABNORMAL LOW (ref 36.0–46.0)
Hemoglobin: 6.9 g/dL — CL (ref 12.0–15.0)
MCH: 30.4 pg (ref 26.0–34.0)
MCHC: 33.3 g/dL (ref 30.0–36.0)
MCV: 91.2 fL (ref 80.0–100.0)
Platelets: 157 10*3/uL (ref 150–400)
RBC: 2.27 MIL/uL — ABNORMAL LOW (ref 3.87–5.11)
RDW: 16.2 % — ABNORMAL HIGH (ref 11.5–15.5)
WBC: 6.5 10*3/uL (ref 4.0–10.5)
nRBC: 0 % (ref 0.0–0.2)

## 2023-11-03 LAB — GLUCOSE, CAPILLARY
Glucose-Capillary: 127 mg/dL — ABNORMAL HIGH (ref 70–99)
Glucose-Capillary: 149 mg/dL — ABNORMAL HIGH (ref 70–99)
Glucose-Capillary: 200 mg/dL — ABNORMAL HIGH (ref 70–99)
Glucose-Capillary: 170 mg/dL — ABNORMAL HIGH (ref 70–99)
Glucose-Capillary: 149 mg/dL — ABNORMAL HIGH (ref 70–99)

## 2023-11-03 LAB — PREPARE RBC (CROSSMATCH)

## 2023-11-03 LAB — MAGNESIUM: Magnesium: 1.8 mg/dL (ref 1.7–2.4)

## 2023-11-03 MED ORDER — SODIUM CHLORIDE 0.9% IV SOLUTION: Freq: Once | INTRAVENOUS | Status: AC

## 2023-11-03 MED ORDER — SENNOSIDES-DOCUSATE SODIUM 8.6-50 MG PO TABS: 1.0000 | ORAL_TABLET | Freq: Two times a day (BID) | ORAL | Status: DC | PRN

## 2023-11-03 MED ORDER — HYDROCODONE-ACETAMINOPHEN 5-325 MG PO TABS: 1.0000 | ORAL_TABLET | Freq: Four times a day (QID) | ORAL | Status: DC | PRN

## 2023-11-03 MED ORDER — ACETAMINOPHEN 325 MG PO TABS
650.0000 mg | ORAL_TABLET | Freq: Four times a day (QID) | ORAL | Status: DC
  Administered 2023-11-03 – 2023-11-06 (×11): 650 mg via ORAL
  Filled 2023-11-03 (×11): qty 2

## 2023-11-03 NOTE — Plan of Care (Signed)
 Patient AAOx4, forgetful. OOB to chair today. 1 unit PRBCs transfusing. SCDs in place. Safety precautions maintained.    Problem: Clinical Measurements: Goal: Ability to maintain clinical measurements within normal limits will improve Outcome: Progressing Goal: Will remain free from infection Outcome: Progressing   Problem: Activity: Goal: Risk for activity intolerance will decrease Outcome: Progressing   Problem: Nutrition: Goal: Adequate nutrition will be maintained Outcome: Progressing   Problem: Pain Managment: Goal: General experience of comfort will improve and/or be controlled Outcome: Progressing   Problem: Safety: Goal: Ability to remain free from injury will improve Outcome: Progressing

## 2023-11-03 NOTE — Plan of Care (Signed)
  Problem: Education: Goal: Knowledge of General Education information will improve Description: Including pain rating scale, medication(s)/side effects and non-pharmacologic comfort measures Outcome: Progressing   Problem: Health Behavior/Discharge Planning: Goal: Ability to manage health-related needs will improve Outcome: Progressing   Problem: Clinical Measurements: Goal: Ability to maintain clinical measurements within normal limits will improve Outcome: Progressing Goal: Will remain free from infection Outcome: Progressing Goal: Diagnostic test results will improve Outcome: Progressing Goal: Respiratory complications will improve Outcome: Progressing Goal: Cardiovascular complication will be avoided Outcome: Progressing   Problem: Activity: Goal: Risk for activity intolerance will decrease Outcome: Progressing   Problem: Nutrition: Goal: Adequate nutrition will be maintained Outcome: Progressing   Problem: Coping: Goal: Level of anxiety will decrease Outcome: Progressing   Problem: Elimination: Goal: Will not experience complications related to bowel motility Outcome: Progressing Goal: Will not experience complications related to urinary retention Outcome: Progressing   Problem: Pain Managment: Goal: General experience of comfort will improve and/or be controlled Outcome: Progressing   Problem: Safety: Goal: Ability to remain free from injury will improve Outcome: Progressing   Problem: Skin Integrity: Goal: Risk for impaired skin integrity will decrease Outcome: Progressing   Problem: Education: Goal: Ability to describe self-care measures that may prevent or decrease complications (Diabetes Survival Skills Education) will improve Outcome: Progressing Goal: Individualized Educational Video(s) Outcome: Progressing   Problem: Coping: Goal: Ability to adjust to condition or change in health will improve Outcome: Progressing   Problem: Fluid  Volume: Goal: Ability to maintain a balanced intake and output will improve Outcome: Progressing   Problem: Health Behavior/Discharge Planning: Goal: Ability to identify and utilize available resources and services will improve Outcome: Progressing Goal: Ability to manage health-related needs will improve Outcome: Progressing   Problem: Metabolic: Goal: Ability to maintain appropriate glucose levels will improve Outcome: Progressing   Problem: Nutritional: Goal: Maintenance of adequate nutrition will improve Outcome: Progressing Goal: Progress toward achieving an optimal weight will improve Outcome: Progressing   Problem: Skin Integrity: Goal: Risk for impaired skin integrity will decrease Outcome: Progressing   Problem: Tissue Perfusion: Goal: Adequacy of tissue perfusion will improve Outcome: Progressing   Problem: Education: Goal: Verbalization of understanding the information provided (i.e., activity precautions, restrictions, etc) will improve Outcome: Progressing Goal: Individualized Educational Video(s) Outcome: Progressing   Problem: Activity: Goal: Ability to ambulate and perform ADLs will improve Outcome: Progressing   Problem: Clinical Measurements: Goal: Postoperative complications will be avoided or minimized Outcome: Progressing   Problem: Self-Concept: Goal: Ability to maintain and perform role responsibilities to the fullest extent possible will improve Outcome: Progressing   Problem: Pain Management: Goal: Pain level will decrease Outcome: Progressing

## 2023-11-03 NOTE — Progress Notes (Signed)
 Subjective: 2 Days Post-Op s/p Procedure(s): FIXATION, FRACTURE, INTERTROCHANTERIC, WITH INTRAMEDULLARY ROD   Patient is awake and oriented to self.  Cyclical in responses, difficult to reidrect to conversation at times.  Denies numbness or tingling.  Reports pain as moderate.  Able to respond to commands.  Denies pain at rest.   Objective:  PE: VITALS:   Vitals:   11/02/23 1427 11/02/23 1744 11/02/23 1951 11/03/23 0440  BP: (!) 118/42 (!) 134/56 (!) 132/52 (!) 116/58  Pulse: 85 81 81 83  Resp: 16 16 16 17   Temp: 98.1 F (36.7 C) 98.4 F (36.9 C) 97.7 F (36.5 C) 97.6 F (36.4 C)  TempSrc: Oral Oral Oral Oral  SpO2: 97% 99% 100%   Weight:      Height:        Alert, resting in bed in NAD Sensation intact distally Intact pulses distally Incision: dressing C/D/I, very small area of blood -- marked border and dated bandage.  Proximal bandage with loose borders.  No active drainage.  Nursing staff notified Does not wish to participate with dorsiflexion/plantarflexion due to reported pain  Wiggles toes appropriately   LABS  Results for orders placed or performed during the hospital encounter of 10/31/23 (from the past 24 hours)  Glucose, capillary     Status: Abnormal   Collection Time: 11/02/23  8:10 AM  Result Value Ref Range   Glucose-Capillary 162 (H) 70 - 99 mg/dL  Hepatitis A antibody, total     Status: Abnormal   Collection Time: 11/02/23 10:33 AM  Result Value Ref Range   hep A Total Ab Reactive (A) NON REACTIVE  Glucose, capillary     Status: Abnormal   Collection Time: 11/02/23  2:31 PM  Result Value Ref Range   Glucose-Capillary 106 (H) 70 - 99 mg/dL  Glucose, capillary     Status: Abnormal   Collection Time: 11/02/23  5:42 PM  Result Value Ref Range   Glucose-Capillary 102 (H) 70 - 99 mg/dL  Glucose, capillary     Status: Abnormal   Collection Time: 11/02/23  7:49 PM  Result Value Ref Range   Glucose-Capillary 104 (H) 70 - 99 mg/dL  Glucose,  capillary     Status: Abnormal   Collection Time: 11/02/23 11:50 PM  Result Value Ref Range   Glucose-Capillary 125 (H) 70 - 99 mg/dL  Glucose, capillary     Status: Abnormal   Collection Time: 11/03/23  4:39 AM  Result Value Ref Range   Glucose-Capillary 127 (H) 70 - 99 mg/dL    DG HIP PORT UNILAT W OR W/O PELVIS 1V RIGHT Result Date: 11/01/2023 CLINICAL DATA:  [id number] Fracture [id number] EXAM: DG HIP (WITH OR WITHOUT PELVIS) 1V PORT RIGHT COMPARISON:  Oct 31, 2023 FINDINGS: Osteopenia. Status post intramedullary rod fixation of the RIGHT proximal femur. Orthopedic hardware is intact and without periprosthetic fracture or lucency. Improved alignment of a inter trochanteric RIGHT femoral fracture. No new fractures identified. Atherosclerotic calcifications. Soft tissue air. Sacrum is obscured by overlapping bowel contents. IMPRESSION: Expected postsurgical appearance status post intramedullary rod fixation of the RIGHT femur. Electronically Signed   By: Clancy Crimes M.D.   On: 11/01/2023 16:33   DG FEMUR, MIN 2 VIEWS RIGHT Result Date: 11/01/2023 CLINICAL DATA:  [id number] Elective surgery [id number] EXAM: RIGHT FEMUR 2 VIEWS COMPARISON:  Oct 31, 2023 FINDINGS: Spot fluoroscopy images were obtained for surgical planning purposes. These demonstrate patient is status post intramedullary rod fixation of the RIGHT proximal femur.  Improved alignment of a inter trochanteric fracture. Time: 52 seconds Dose: 4.76 mGy Please reference procedure report for further details. IMPRESSION: Fluoroscopic images for surgical planning purposes. Electronically Signed   By: Clancy Crimes M.D.   On: 11/01/2023 16:31   DG C-Arm 1-60 Min-No Report Result Date: 11/01/2023 Fluoroscopy was utilized by the requesting physician.  No radiographic interpretation.   DG Knee 1-2 Views Right Result Date: 11/01/2023 CLINICAL DATA:  Right knee pain after fall. EXAM: RIGHT KNEE - 1-2 VIEW COMPARISON:  None Available. FINDINGS: No  evidence of fracture, dislocation, or joint effusion. No evidence of arthropathy or other focal bone abnormality. Soft tissues are unremarkable. IMPRESSION: Negative. Electronically Signed   By: Rosalene Colon M.D.   On: 11/01/2023 11:10    Assessment/Plan: Principal Problem:   Closed right hip fracture (HCC) Active Problems:   Type 2 diabetes mellitus without complications (HCC)   Osteoporosis   Gastro-esophageal reflux disease without esophagitis   Anemia in chronic kidney disease   Nonrheumatic mitral valve regurgitation   End stage renal disease (HCC)   Physical deconditioning   Unspecified protein-calorie malnutrition (HCC)   Impaired mobility and ADLs   Hypothyroidism, unspecified   Type 2 diabetes mellitus with diabetic neuropathy, unspecified (HCC)    2 Days Post-Op s/p Procedure(s): FIXATION, FRACTURE, INTERTROCHANTERIC, WITH INTRAMEDULLARY ROD  5/18: Proximal bandage not fully sealed, messaged nursing staff.  No active drainage.  Hospitalist aware.  Can change bandages PRN.  Low hgb 7.2 yesterday, can consider transfusion and/or holding DVT prophylaxis as needed.    Weightbearing: WBAT RLE, up with therapy Insicional and dressing care: Reinforce dressings as needed Orthopedic device(s): None VTE prophylaxis: aspirin  Pain control: continue current regimen, limit narcotics as much as possible Follow - up plan: w/ Dr. Curtiss Dowdy Dispo: PT eval complete, will need SNF, TOC on board   Contact information:   Mason Sole, PA-C Weekdays 8-5  After hours and holidays please check [email or web address] for group call information for [facility]  Albertus Alt 11/03/2023, 7:19 AM

## 2023-11-03 NOTE — Progress Notes (Signed)
 PROGRESS NOTE  Erica Crosby ZOX:[id number] DOB: 1945/07/28   PCP: Eliodoro Guerin, DO  Patient is from: Home.   DOA: 10/31/2023 LOS: 3  Chief complaints Chief Complaint  Patient presents with   Fall   Hip Pain     Brief Narrative / Interim history: 78 year old F with PMH of ESRD on HD TTS, DM-2 with neuropathy, HTN, HLD, GERD, osteoporosis and malnutrition brought to [facility] due to right hip pain after she sustained a fall at home and admitted with right femoral fracture.  Reportedly fell when she was trying to get up to get some snack for low blood sugar.  No prodromes leading to this.  Did not strike head.  No LOC.  In ED, slightly hypertensive.  Labs without significant acute finding.  Hip x-ray showed severely angulated intertrochanteric fracture of proximal right femur.  Right shoulder, chest and right knee x-ray without acute finding.  CT head without acute finding.  Orthopedic surgery consulted and patient was transferred here.  Patient underwent cephalomedullary nailing of right intertrochanteric femur fracture by Dr. Curtiss Dowdy on 5/16.  Hgb dropped about 3 g postoperatively.  Reported EBL 100 cc.  No overt bleeding.  Therapy recommended SNF.  Subjective: Seen and examined earlier this morning.  No major events overnight or this morning.  Complaining about multiple needle sticks for blood draw.  She does not want to be stuck again.  She is awake and alert and oriented x 4 but not a great historian.  She has limited insight about her situation.  Objective: Vitals:   11/02/23 1744 11/02/23 1951 11/03/23 0440 11/03/23 0733  BP: (!) 134/56 (!) 132/52 (!) 116/58 108/78  Pulse: 81 81 83 83  Resp: 16 16 17 19   Temp: 98.4 F (36.9 C) 97.7 F (36.5 C) 97.6 F (36.4 C) 98.1 F (36.7 C)  TempSrc: Oral Oral Oral Oral  SpO2: 99% 100%  99%  Weight:      Height:        Examination:  GENERAL: No apparent distress.  Appears frail. HEENT: MMM.  Vision and hearing grossly intact.   NECK: Supple.  No apparent JVD.  RESP:  No IWOB.  Fair aeration bilaterally. CVS:  RRR. Heart sounds normal.  ABD/GI/GU: BS+. Abd soft, NTND.  MSK/EXT:  Moves left extremity.  Tender over right thigh/hip.  Dressing DCI except for small dark spot.  No signs of hematoma or ecchymosis. SKIN: As above. NEURO: Awake and alert and oriented x 4 using the board on the wall.  Follows commands.  Anxious to move right leg PSYCH: Calm. Normal affect.  Consultants:  Orthopedic surgery  Procedures: 5/16-cephalomedullary nailing of right intertrochanteric femur fracture by Dr. Curtiss Dowdy  Microbiology summarized: None  Assessment and plan: Accidental fall at home: [facility] when she was trying to walk to get something to eat for low glucose. Closed right proximal femur fracture due to accidental fall and osteoporosis History of osteoporosis -S/p cephalomedullary nailing of right intertrochanteric femur fracture by Dr. Curtiss Dowdy on 5/16 -Vitamin D  level within normal. -Schedule Tylenol  and decreased Norco -On full dose aspirin  for VTE prophylaxis per Ortho -Bowel regimen -PT/OT-recommended SNF  ESRD on HD TTS: Last HD on 5/13.  No emergent need but morning labs pending -Nephrology on board   Essential hypertension: Normotensive. -Continue holding home amlodipine   NIDDM-2 with hyperglycemia: A1c 5.7% (6.3% in 05/2023) Recent Labs  Lab 11/02/23 1949 11/02/23 2350 11/03/23 0439 11/03/23 0735 11/03/23 1217  GLUCAP 104* 125* 127* 149* 200*  -  Continue SSI-very sensitive  Anemia of renal disease/possible postoperative blood loss anemia: Hgb dropped about 2 g postoperatively.  Reported EBL 100 cc.  No signs of overt bleeding. Recent Labs    12/06/22 1337 12/19/22 1326 01/03/23 1313 08/09/23 1139 09/04/23 0921 10/31/23 1032 11/01/23 1058 11/01/23 1407 11/02/23 0625 11/03/23 1255  HGB 12.2 12.4 12.2 10.4* 11.9* 9.6* 9.5* 9.9* 7.2* 6.9*  -Transfuse 1 unit.  Daughter, Nadine Aus gave verbal consent  on 5/17. - Monitor H&H  Hypokalemia: Resolved   GERD - Continue pantoprazole   Advance care planning: Full code.  See goal of care discussion on 5/17   Increased nutrient needs Body mass index is 16.46 kg/m. Nutrition Problem: Increased nutrient needs Etiology: chronic illness, post-op healing Signs/Symptoms: estimated needs Interventions: Refer to RD note for recommendations, Nepro shake   DVT prophylaxis:  SCDs Start: 11/01/23 1649 Full dose aspirin  Code Status: Full code. Family Communication: None at bedside today. Level of care: Med-Surg Status is: Inpatient Remains inpatient appropriate because: Right hip fracture and ABLA   Final disposition: SNF   55 minutes with more than 50% spent in reviewing records, counseling patient/family and coordinating care.   Sch Meds:  Scheduled Meds:  acetaminophen   650 mg Oral Q6H WA   aspirin  EC  325 mg Oral Q breakfast   atorvastatin   10 mg Oral Daily   carvedilol   6.25 mg Oral BID WC   Chlorhexidine  Gluconate Cloth  6 each Topical Q0600   docusate sodium   100 mg Oral BID   feeding supplement (NEPRO CARB STEADY)  237 mL Oral BID BM   folic acid   1 mg Oral Daily   insulin  aspart  0-6 Units Subcutaneous Q4H   pantoprazole   40 mg Oral BID AC   Continuous Infusions:   PRN Meds:.HYDROcodone -acetaminophen , menthol -cetylpyridinium **OR** phenol, methocarbamol  **OR** methocarbamol  (ROBAXIN ) injection, metoCLOPramide  **OR** metoCLOPramide  (REGLAN ) injection, morphine  injection, ondansetron  **OR** ondansetron  (ZOFRAN ) IV, senna-docusate  Antimicrobials: Anti-infectives (From admission, onward)    Start     Dose/Rate Route Frequency Ordered Stop   11/01/23 1745  ceFAZolin  (ANCEF ) IVPB 2g/100 mL premix  Status:  Discontinued        2 g 200 mL/hr over 30 Minutes Intravenous Every 8 hours 11/01/23 1648 11/01/23 1717   11/01/23 1045  ceFAZolin  (ANCEF ) IVPB 2g/100 mL premix        2 g 200 mL/hr over 30 Minutes Intravenous On call  to O.R. 11/01/23 0947 11/01/23 1506        I have personally reviewed the following labs and images: CBC: Recent Labs  Lab 10/31/23 1032 11/01/23 1058 11/01/23 1407 11/02/23 0625 11/03/23 1255  WBC 9.3 7.4  --  8.4 6.5  NEUTROABS 7.9*  --   --   --   --   HGB 9.6* 9.5* 9.9* 7.2* 6.9*  HCT 29.8* 28.9* 29.0* 22.1* 20.7*  MCV 92.0 90.6  --  91.7 91.2  PLT 168 181  --  155 157   BMP &GFR Recent Labs  Lab 10/31/23 1032 11/01/23 1407 11/01/23 1730 11/02/23 0625 11/03/23 1255  NA 135 140 140 136 133*  K 3.4* 3.8 3.8 3.4* 3.8  CL 100 102 103 103 96*  CO2 24  --  21* 20* 27  GLUCOSE 210* 127* 145* 145* 187*  BUN 36* 44* 48* 53* 36*  CREATININE 3.72* 4.50* 4.49* 4.57* 3.56*  CALCIUM  8.9  --  8.8* 7.3* 8.0*  MG  --   --   --  1.8 1.8  PHOS  --   --  5.6* 5.2* 2.5   Estimated Creatinine Clearance: 9.8 mL/min (A) (by C-G formula based on SCr of 3.56 mg/dL (H)). Liver & Pancreas: Recent Labs  Lab 10/31/23 1032 11/01/23 1730 11/02/23 0625 11/03/23 1255  AST 24  --   --   --   ALT 16  --   --   --   ALKPHOS 64  --   --   --   BILITOT 0.9  --   --   --   PROT 8.0  --   --   --   ALBUMIN 2.9* 2.7* 2.0* 2.2*   No results for input(s): "LIPASE", "AMYLASE" in the last 168 hours. No results for input(s): "AMMONIA" in the last 168 hours. Diabetic: Recent Labs    11/01/23 1221  HGBA1C 5.7*   Recent Labs  Lab 11/02/23 1949 11/02/23 2350 11/03/23 0439 11/03/23 0735 11/03/23 1217  GLUCAP 104* 125* 127* 149* 200*   Cardiac Enzymes: No results for input(s): "CKTOTAL", "CKMB", "CKMBINDEX", "TROPONINI" in the last 168 hours. No results for input(s): "PROBNP" in the last 8760 hours. Coagulation Profile: No results for input(s): "INR", "PROTIME" in the last 168 hours. Thyroid  Function Tests: Recent Labs    11/01/23 1221  TSH 0.560   Lipid Profile: No results for input(s): "CHOL", "HDL", "LDLCALC", "TRIG", "CHOLHDL", "LDLDIRECT" in the last 72 hours. Anemia  Panel: No results for input(s): "VITAMINB12", "FOLATE", "FERRITIN", "TIBC", "IRON", "RETICCTPCT" in the last 72 hours. Urine analysis:    Component Value Date/Time   COLORURINE STRAW (A) 08/09/2023 1455   APPEARANCEUR CLEAR 08/09/2023 1455   APPEARANCEUR Cloudy (A) 01/07/2023 1504   LABSPEC 1.008 08/09/2023 1455   PHURINE 6.0 08/09/2023 1455   GLUCOSEU NEGATIVE 08/09/2023 1455   HGBUR NEGATIVE 08/09/2023 1455   BILIRUBINUR NEGATIVE 08/09/2023 1455   BILIRUBINUR Negative 01/07/2023 1504   KETONESUR NEGATIVE 08/09/2023 1455   PROTEINUR 30 (A) 08/09/2023 1455   UROBILINOGEN 1.0 02/10/2012 0817   NITRITE NEGATIVE 08/09/2023 1455   LEUKOCYTESUR NEGATIVE 08/09/2023 1455   Sepsis Labs: Invalid input(s): "PROCALCITONIN", "LACTICIDVEN"  Microbiology: Recent Results (from the past 240 hours)  Surgical pcr screen     Status: Abnormal   Collection Time: 11/01/23 11:38 AM   Specimen: Nasal Mucosa; Nasal Swab  Result Value Ref Range Status   MRSA, PCR NEGATIVE NEGATIVE Final   Staphylococcus aureus POSITIVE (A) NEGATIVE Final    Comment: (NOTE) The Xpert SA Assay (FDA approved for NASAL specimens in patients 62 years of age and older), is one component of a comprehensive surveillance program. It is not intended to diagnose infection nor to guide or monitor treatment. Performed at [facility], [address], [place], [place] [address]     Radiology Studies: No results found.     Hendrix Console T. Diesel Lina [facility]  If 7PM-7AM, please contact night-coverage [email or web address] 11/03/2023, 2:20 PM

## 2023-11-03 NOTE — Progress Notes (Signed)
 [facility] Progress Note   Subjective:   Patient seen in room. Reports she is having some leg pain today. Denies SOB, CP, dizziness.  Objective Vitals:   11/02/23 1744 11/02/23 1951 11/03/23 0440 11/03/23 0733  BP: (!) 134/56 (!) 132/52 (!) 116/58 108/78  Pulse: 81 81 83 83  Resp: 16 16 17 19   Temp: 98.4 F (36.9 C) 97.7 F (36.5 C) 97.6 F (36.4 C) 98.1 F (36.7 C)  TempSrc: Oral Oral Oral Oral  SpO2: 99% 100%  99%  Weight:      Height:       Physical Exam General: alert female in NAD Heart: RRR, no murmurs, rubs or gallops Lungs: CTA bilaterally, respirations unlabored Abdomen: Soft, non-distended, +BS Extremities: no edema b/l lower extremities Dialysis Access: R internal jugular [facility]  Additional Objective Labs: Basic Metabolic Panel: Recent Labs  Lab 10/31/23 1032 11/01/23 1407 11/01/23 1730 11/02/23 0625  NA 135 140 140 136  K 3.4* 3.8 3.8 3.4*  CL 100 102 103 103  CO2 24  --  21* 20*  GLUCOSE 210* 127* 145* 145*  BUN 36* 44* 48* 53*  CREATININE 3.72* 4.50* 4.49* 4.57*  CALCIUM  8.9  --  8.8* 7.3*  PHOS  --   --  5.6* 5.2*   Liver Function Tests: Recent Labs  Lab 10/31/23 1032 11/01/23 1730 11/02/23 0625  AST 24  --   --   ALT 16  --   --   ALKPHOS 64  --   --   BILITOT 0.9  --   --   PROT 8.0  --   --   ALBUMIN 2.9* 2.7* 2.0*   No results for input(s): "LIPASE", "AMYLASE" in the last 168 hours. CBC: Recent Labs  Lab 10/31/23 1032 11/01/23 1058 11/01/23 1407 11/02/23 0625  WBC 9.3 7.4  --  8.4  NEUTROABS 7.9*  --   --   --   HGB 9.6* 9.5* 9.9* 7.2*  HCT 29.8* 28.9* 29.0* 22.1*  MCV 92.0 90.6  --  91.7  PLT 168 181  --  155   Blood Culture    Component Value Date/Time   SDES RIGHT ANTECUBITAL 05/21/2019 1612   SPECREQUEST  05/21/2019 1612    BOTTLES DRAWN AEROBIC AND ANAEROBIC Blood Culture adequate volume   CULT  05/21/2019 1612    NO GROWTH 5 DAYS Performed at [facility], [address], [place], [place]  [address]    REPTSTATUS 05/26/2019 FINAL 05/21/2019 1612    Cardiac Enzymes: No results for input(s): "CKTOTAL", "CKMB", "CKMBINDEX", "TROPONINI" in the last 168 hours. CBG: Recent Labs  Lab 11/02/23 1742 11/02/23 1949 11/02/23 2350 11/03/23 0439 11/03/23 0735  GLUCAP 102* 104* 125* 127* 149*   Iron Studies: No results for input(s): "IRON", "TIBC", "TRANSFERRIN", "FERRITIN" in the last 72 hours. @lablastinr3 @ Studies/Results: DG HIP PORT UNILAT W OR W/O PELVIS 1V RIGHT Result Date: 11/01/2023 CLINICAL DATA:  [id number] Fracture [id number] EXAM: DG HIP (WITH OR WITHOUT PELVIS) 1V PORT RIGHT COMPARISON:  Oct 31, 2023 FINDINGS: Osteopenia. Status post intramedullary rod fixation of the RIGHT proximal femur. Orthopedic hardware is intact and without periprosthetic fracture or lucency. Improved alignment of a inter trochanteric RIGHT femoral fracture. No new fractures identified. Atherosclerotic calcifications. Soft tissue air. Sacrum is obscured by overlapping bowel contents. IMPRESSION: Expected postsurgical appearance status post intramedullary rod fixation of the RIGHT femur. Electronically Signed   By: Clancy Crimes M.D.   On: 11/01/2023 16:33   DG FEMUR, MIN 2  VIEWS RIGHT Result Date: 11/01/2023 CLINICAL DATA:  [id number] Elective surgery [id number] EXAM: RIGHT FEMUR 2 VIEWS COMPARISON:  Oct 31, 2023 FINDINGS: Spot fluoroscopy images were obtained for surgical planning purposes. These demonstrate patient is status post intramedullary rod fixation of the RIGHT proximal femur. Improved alignment of a inter trochanteric fracture. Time: 52 seconds Dose: 4.76 mGy Please reference procedure report for further details. IMPRESSION: Fluoroscopic images for surgical planning purposes. Electronically Signed   By: Clancy Crimes M.D.   On: 11/01/2023 16:31   DG C-Arm 1-60 Min-No Report Result Date: 11/01/2023 Fluoroscopy was utilized by the requesting physician.  No radiographic interpretation.    Medications:   acetaminophen   650 mg Oral Q6H WA   aspirin  EC  325 mg Oral Q breakfast   atorvastatin   10 mg Oral Daily   carvedilol   6.25 mg Oral BID WC   Chlorhexidine  Gluconate Cloth  6 each Topical Q0600   docusate sodium   100 mg Oral BID   feeding supplement (NEPRO CARB STEADY)  237 mL Oral BID BM   folic acid   1 mg Oral Daily   insulin  aspart  0-6 Units Subcutaneous Q4H   pantoprazole   40 mg Oral BID AC    Dialysis Orders: [facility] TTS (started march 2025)  3.5h  B300   45.5kg   TDC   Heparin  1500  Last HD 5/13, comes off 0-1.5 kg over, good compliance Bp's are low-normal to normal at OP HD Mircera 50mcg IV q 2 weeks- last dose 5/13  Assessment/Plan: R hip fracture: after fall at home. SP ORIF on 5/16 per orthopedics.  ESRD: on HD TTS. Had HD here Saturday. Next treatment Tuesday  HTN: BP controlled, had some hypotension on HD, now resolved Volume: no vol excess on exam. UFG 1-2L As tolerated tomorrow.  Anemia of esrd: Hb 7.2 post op. ESA recently dosed. Transfuse PRN Secondary hyperparathyroidism: CCa in range, phos at goal.  DM2: per pmd  Ramona Burner, PA-C 11/03/2023, 11:17 AM  [facility] Pager: [phone]

## 2023-11-03 NOTE — Evaluation (Signed)
 Occupational Therapy Evaluation Patient Details Name: Erica Crosby MRN: [id number] DOB: Jun 01, 1946 Today's Date: 11/03/2023   History of Present Illness   Pt is a 78 y.o. female admitted 5/15 following a fall at home. Imaging revealed R hip fx. She underwent IMN 5/16. PMH: ESRD on HD (TTS), HTN, T2DM, HLD, GERD.     Clinical Impressions Pt reports mobilizing with a cane at baseline, ind with ADL. Pt live with her son who is there PRN. Pt currently needing CGA-max A for ADLs, max A for bed mobility and mod A for pivot transfers with RW. Pt presenting with impairments listed below, will follow acutely. Patient will benefit from continued inpatient follow up therapy, <3 hours/day  to maximize safety/ind with ADL/functional mobility.      If plan is discharge home, recommend the following:   A lot of help with walking and/or transfers;A lot of help with bathing/dressing/bathroom;Assistance with cooking/housework;Direct supervision/assist for medications management;Direct supervision/assist for financial management;Assist for transportation;Help with stairs or ramp for entrance     Functional Status Assessment   Patient has had a recent decline in their functional status and demonstrates the ability to make significant improvements in function in a reasonable and predictable amount of time.     Equipment Recommendations   Other (comment) (defer)     Recommendations for Other Services   PT consult     Precautions/Restrictions   Precautions Precautions: Fall Recall of Precautions/Restrictions: Impaired Restrictions Weight Bearing Restrictions Per Provider Order: No RLE Weight Bearing Per Provider Order: Weight bearing as tolerated     Mobility Bed Mobility Overal bed mobility: Needs Assistance Bed Mobility: Supine to Sit, Sit to Supine     Supine to sit: Max assist, HOB elevated          Transfers Overall transfer level: Needs assistance Equipment used:  Rolling walker (2 wheels) Transfers: Sit to/from Stand, Bed to chair/wheelchair/BSC Sit to Stand: Mod assist Stand pivot transfers: Mod assist                Balance Overall balance assessment: Needs assistance Sitting-balance support: Feet unsupported, Bilateral upper extremity supported Sitting balance-Leahy Scale: Fair     Standing balance support: During functional activity, Reliant on assistive device for balance Standing balance-Leahy Scale: Poor Standing balance comment: RW for external support                           ADL either performed or assessed with clinical judgement   ADL Overall ADL's : Needs assistance/impaired Eating/Feeding: Set up;Sitting   Grooming: Contact guard assist;Sitting   Upper Body Bathing: Minimal assistance;Sitting   Lower Body Bathing: Sitting/lateral leans;Maximal assistance   Upper Body Dressing : Minimal assistance;Sitting   Lower Body Dressing: Maximal assistance   Toilet Transfer: Moderate assistance;Stand-pivot;Rolling walker (2 wheels);BSC/3in1   Toileting- Clothing Manipulation and Hygiene: Maximal assistance       Functional mobility during ADLs: Moderate assistance;Rolling walker (2 wheels)       Vision   Vision Assessment?: No apparent visual deficits     Perception Perception: Not tested       Praxis Praxis: Not tested       Pertinent Vitals/Pain Pain Assessment Pain Assessment: No/denies pain Faces Pain Scale: Hurts even more Pain Location: RLE Pain Descriptors / Indicators: Grimacing, Guarding, Moaning Pain Intervention(s): Limited activity within patient's tolerance, Monitored during session, Repositioned     Extremity/Trunk Assessment Upper Extremity Assessment Upper Extremity Assessment: Generalized weakness  Lower Extremity Assessment Lower Extremity Assessment: Defer to PT evaluation   Cervical / Trunk Assessment Cervical / Trunk Assessment: Kyphotic   Communication  Communication Communication: No apparent difficulties   Cognition Arousal: Alert Behavior During Therapy: Flat affect Cognition: No family/caregiver present to determine baseline             OT - Cognition Comments: incr cues for command follow, perseverative on her bed being wet                 Following commands: Impaired Following commands impaired: Follows one step commands inconsistently, Follows one step commands with increased time     Cueing  General Comments   Cueing Techniques: Verbal cues;Gestural cues;Tactile cues  VSS on RA   Exercises     Shoulder Instructions      Home Living Family/patient expects to be discharged to:: Private residence Living Arrangements: Children (son) Available Help at Discharge: Family;Available PRN/intermittently Type of Home: Mobile home Home Access: Stairs to enter Entrance Stairs-Number of Steps: 4 Entrance Stairs-Rails: Right;Left Home Layout: One level     Bathroom Shower/Tub: Chief Strategy Officer: Standard     Home Equipment: Cane - single point          Prior Functioning/Environment               Mobility Comments: amb household distances with cane ADLs Comments: medical transport to/from HD, family assists with iADLs    OT Problem List: Decreased strength;Decreased range of motion;Decreased activity tolerance;Impaired balance (sitting and/or standing);Decreased cognition;Decreased safety awareness;Pain   OT Treatment/Interventions: Self-care/ADL training;Therapeutic exercise;Energy conservation;DME and/or AE instruction;Therapeutic activities;Patient/family education;Balance training      OT Goals(Current goals can be found in the care plan section)   Acute Rehab OT Goals Patient Stated Goal: none stated OT Goal Formulation: With patient Time For Goal Achievement: 11/17/23 Potential to Achieve Goals: Good ADL Goals Pt Will Perform Grooming: with  supervision;standing;sitting Pt Will Perform Upper Body Dressing: with supervision;standing;sitting Pt Will Perform Lower Body Dressing: with supervision;sit to/from stand;sitting/lateral leans Pt Will Transfer to Toilet: with supervision;stand pivot transfer;bedside commode;squat pivot transfer Additional ADL Goal #1: pt will perform bed mobility with supervision in prep for ADLs   OT Frequency:  Min 2X/week    Co-evaluation              AM-PAC OT "6 Clicks" Daily Activity     Outcome Measure Help from another person eating meals?: A Little Help from another person taking care of personal grooming?: A Little Help from another person toileting, which includes using toliet, bedpan, or urinal?: A Lot Help from another person bathing (including washing, rinsing, drying)?: A Lot Help from another person to put on and taking off regular upper body clothing?: A Lot Help from another person to put on and taking off regular lower body clothing?: A Lot 6 Click Score: 14   End of Session Equipment Utilized During Treatment: Gait belt;Rolling walker (2 wheels) Nurse Communication: Mobility status  Activity Tolerance: Patient tolerated treatment well Patient left: with call bell/phone within reach;in chair;with chair alarm set  OT Visit Diagnosis: Unsteadiness on feet (R26.81);Other abnormalities of gait and mobility (R26.89);Muscle weakness (generalized) (M62.81);History of falling (Z91.81)                Time: 1610-9604 OT Time Calculation (min): 24 min Charges:  OT General Charges $OT Visit: 1 Visit OT Evaluation $OT Eval Moderate Complexity: 1 Mod OT Treatments $Therapeutic Activity: 8-22 mins  Koben Daman  K, OTD, OTR/L SecureChat Preferred Acute Rehab [phone]   Antionette Kirks 11/03/2023, 12:26 PM

## 2023-11-04 DIAGNOSIS — I340 Nonrheumatic mitral (valve) insufficiency: Secondary | ICD-10-CM

## 2023-11-04 DIAGNOSIS — E039 Hypothyroidism, unspecified: Secondary | ICD-10-CM | POA: Diagnosis not present

## 2023-11-04 DIAGNOSIS — N186 End stage renal disease: Secondary | ICD-10-CM | POA: Diagnosis not present

## 2023-11-04 DIAGNOSIS — E1140 Type 2 diabetes mellitus with diabetic neuropathy, unspecified: Secondary | ICD-10-CM | POA: Diagnosis not present

## 2023-11-04 DIAGNOSIS — S72001A Fracture of unspecified part of neck of right femur, initial encounter for closed fracture: Secondary | ICD-10-CM | POA: Diagnosis not present

## 2023-11-04 LAB — CBC
HCT: 24 % — ABNORMAL LOW (ref 36.0–46.0)
Hemoglobin: 8 g/dL — ABNORMAL LOW (ref 12.0–15.0)
MCH: 29.5 pg (ref 26.0–34.0)
MCHC: 33.3 g/dL (ref 30.0–36.0)
MCV: 88.6 fL (ref 80.0–100.0)
Platelets: 182 10*3/uL (ref 150–400)
RBC: 2.71 MIL/uL — ABNORMAL LOW (ref 3.87–5.11)
RDW: 17.4 % — ABNORMAL HIGH (ref 11.5–15.5)
WBC: 6.4 10*3/uL (ref 4.0–10.5)
nRBC: 0 % (ref 0.0–0.2)

## 2023-11-04 LAB — GLUCOSE, CAPILLARY
Glucose-Capillary: 101 mg/dL — ABNORMAL HIGH (ref 70–99)
Glucose-Capillary: 116 mg/dL — ABNORMAL HIGH (ref 70–99)
Glucose-Capillary: 122 mg/dL — ABNORMAL HIGH (ref 70–99)
Glucose-Capillary: 123 mg/dL — ABNORMAL HIGH (ref 70–99)
Glucose-Capillary: 128 mg/dL — ABNORMAL HIGH (ref 70–99)
Glucose-Capillary: 141 mg/dL — ABNORMAL HIGH (ref 70–99)
Glucose-Capillary: 218 mg/dL — ABNORMAL HIGH (ref 70–99)

## 2023-11-04 LAB — RENAL FUNCTION PANEL
Albumin: 2 g/dL — ABNORMAL LOW (ref 3.5–5.0)
Anion gap: 11 (ref 5–15)
BUN: 48 mg/dL — ABNORMAL HIGH (ref 8–23)
CO2: 25 mmol/L (ref 22–32)
Calcium: 8.4 mg/dL — ABNORMAL LOW (ref 8.9–10.3)
Chloride: 97 mmol/L — ABNORMAL LOW (ref 98–111)
Creatinine, Ser: 3.9 mg/dL — ABNORMAL HIGH (ref 0.44–1.00)
GFR, Estimated: 11 mL/min — ABNORMAL LOW (ref >60)
Glucose, Bld: 105 mg/dL — ABNORMAL HIGH (ref 70–99)
Phosphorus: 2.6 mg/dL (ref 2.5–4.6)
Potassium: 4.1 mmol/L (ref 3.5–5.1)
Sodium: 133 mmol/L — ABNORMAL LOW (ref 135–145)

## 2023-11-04 LAB — BPAM RBC
Blood Product Expiration Date: 202506102359
ISSUE DATE / TIME: 202505181800
Unit Type and Rh: 5100

## 2023-11-04 LAB — TYPE AND SCREEN
ABO/RH(D): O POS
Antibody Screen: NEGATIVE
Unit division: 0

## 2023-11-04 MED ORDER — CHLORHEXIDINE GLUCONATE CLOTH 2 % EX PADS
6.0000 | MEDICATED_PAD | Freq: Every day | CUTANEOUS | Status: DC
  Administered 2023-11-05 – 2023-11-06 (×2): 6 via TOPICAL

## 2023-11-04 MED ORDER — FENTANYL CITRATE PF 50 MCG/ML IJ SOSY: 25.0000 ug | PREFILLED_SYRINGE | INTRAMUSCULAR | Status: DC | PRN

## 2023-11-04 MED ORDER — HYDROCODONE-ACETAMINOPHEN 5-325 MG PO TABS: 1.0000 | ORAL_TABLET | Freq: Four times a day (QID) | ORAL | 0 refills | Status: DC | PRN

## 2023-11-04 NOTE — Progress Notes (Signed)
 Pt receives out-pt HD at [facility] on TTS 11:35 am chair time. Noted plans for likely snf at d/c. Will assist as needed.   Lauraine Polite Renal Navigator [phone]

## 2023-11-04 NOTE — Progress Notes (Signed)
 Orthopaedic Trauma Progress Note  SUBJECTIVE: Patient doing okay this morning.  Sitting up in bed eating breakfast.  No chest pain. No SOB. No nausea/vomiting. No other complaints.   OBJECTIVE:  Vitals:   11/04/23 0455 11/04/23 0737  BP: 128/60 (!) 125/55  Pulse: 81 74  Resp: 16 18  Temp: 98 F (36.7 C) 98.3 F (36.8 C)  SpO2: 100% 99%    Opiates Today (MME): Today's  total administered Morphine  Milligram Equivalents: 0 Opiates Yesterday (MME): Yesterday's total administered Morphine  Milligram Equivalents: 0  General: In bed eating breakfast.  No acute distress Respiratory: No increased work of breathing.  Operative Extremity (right lower extremity): Dressings removed, incisions are clean, dry, intact with Dermabond in place.  Some soreness over the hip and throughout the thigh as expected.  Tolerates gentle ankle range of motion.  No significant calf tenderness.  Endorses sensation all aspects of the foot. + DP pulse  IMAGING: Stable post op imaging.   LABS:  Results for orders placed or performed during the hospital encounter of 10/31/23 (from the past 24 hours)  Glucose, capillary     Status: Abnormal   Collection Time: 11/03/23 12:17 PM  Result Value Ref Range   Glucose-Capillary 200 (H) 70 - 99 mg/dL  Renal function panel     Status: Abnormal   Collection Time: 11/03/23 12:55 PM  Result Value Ref Range   Sodium 133 (L) 135 - 145 mmol/L   Potassium 3.8 3.5 - 5.1 mmol/L   Chloride 96 (L) 98 - 111 mmol/L   CO2 27 22 - 32 mmol/L   Glucose, Bld 187 (H) 70 - 99 mg/dL   BUN 36 (H) 8 - 23 mg/dL   Creatinine, Ser 1.61 (H) 0.44 - 1.00 mg/dL   Calcium  8.0 (L) 8.9 - 10.3 mg/dL   Phosphorus 2.5 2.5 - 4.6 mg/dL   Albumin 2.2 (L) 3.5 - 5.0 g/dL   GFR, Estimated 13 (L) >60 mL/min   Anion gap 10 5 - 15  Magnesium      Status: None   Collection Time: 11/03/23 12:55 PM  Result Value Ref Range   Magnesium  1.8 1.7 - 2.4 mg/dL  CBC     Status: Abnormal   Collection Time: 11/03/23  12:55 PM  Result Value Ref Range   WBC 6.5 4.0 - 10.5 K/uL   RBC 2.27 (L) 3.87 - 5.11 MIL/uL   Hemoglobin 6.9 (LL) 12.0 - 15.0 g/dL   HCT 09.6 (L) 04.5 - 40.9 %   MCV 91.2 80.0 - 100.0 fL   MCH 30.4 26.0 - 34.0 pg   MCHC 33.3 30.0 - 36.0 g/dL   RDW 81.1 (H) 91.4 - 78.2 %   Platelets 157 150 - 400 K/uL   nRBC 0.0 0.0 - 0.2 %  Type and screen [facility]     Status: None (Preliminary result)   Collection Time: 11/03/23  3:40 PM  Result Value Ref Range   ABO/RH(D) O POS    Antibody Screen NEG    Sample Expiration 11/06/2023,2359    Unit Number [id number]    Blood Component Type RED CELLS,LR    Unit division 00    Status of Unit ISSUED    Transfusion Status OK TO TRANSFUSE    Crossmatch Result      Compatible Performed at [facility], [address], [place], [place] [address]   Prepare RBC (crossmatch)     Status: None   Collection Time: 11/03/23  3:42 PM  Result Value Ref Range   Order Confirmation      ORDER PROCESSED BY BLOOD BANK Performed at [facility], [address] [place], [place] [address]   Glucose, capillary     Status: Abnormal   Collection Time: 11/03/23  5:28 PM  Result Value Ref Range   Glucose-Capillary 170 (H) 70 - 99 mg/dL  Glucose, capillary     Status: Abnormal   Collection Time: 11/03/23  8:02 PM  Result Value Ref Range   Glucose-Capillary 149 (H) 70 - 99 mg/dL  Glucose, capillary     Status: Abnormal   Collection Time: 11/04/23 12:22 AM  Result Value Ref Range   Glucose-Capillary 123 (H) 70 - 99 mg/dL  Glucose, capillary     Status: Abnormal   Collection Time: 11/04/23  5:04 AM  Result Value Ref Range   Glucose-Capillary 101 (H) 70 - 99 mg/dL  Glucose, capillary     Status: Abnormal   Collection Time: 11/04/23  7:36 AM  Result Value Ref Range   Glucose-Capillary 116 (H) 70 - 99 mg/dL    ASSESSMENT: Erica Crosby is a 78 y.o. female, 3 Days Post-Op s/p mechanical fall Procedures: INTRAMEDULLARY NAILING  RIGHT INTERTROCHANTERIC FEMUR FRACTURE  CV/Blood loss: Acute blood loss anemia, Hgb 8.0 this AM.  Received 1 unit PRBCs 11/03/2023.  Hemodynamically stable  PLAN: Weightbearing: WBAT RLE ROM: Okay for hip and knee range of motion as tolerated Incisional and dressing care: Okay to leave incisions open to air Showering: Okay to begin showering getting incisions wet Orthopedic device(s): None  Pain management: Continue current multimodal regimen VTE prophylaxis: Aspirin , SCDs ID:  Ancef  2gm post op Foley/Lines:  No foley, KVO IVFs Impediments to Fracture Healing: Vitamin D  level 92, no need for supplementation. Dispo: PT/OT eval ongoing. Okay for discharge from ortho standpoint once cleared by medicine team and therapies. I have signed and placed rx for pain med in chart  D/C recommendations: - Norco, Tylenol  for pain control - Home dose Asa 81 mg for DVT prophylaxis - No additional need for Vit D supplementation  Follow - up plan: 2 weeks after d/c for wound check and repeat x-rays   Contact information:  Erica Pane MD, Erica Jamaica PA-C. After hours and holidays please check [email or web address] for group call information for [facility]   Erica Gordon, PA-C [phone] (office) [email or web address]

## 2023-11-04 NOTE — Progress Notes (Signed)
 PROGRESS NOTE  Erica Crosby WUJ:[id number] DOB: 22-Feb-1946   PCP: Eliodoro Guerin, DO  Patient is from: Home.   DOA: 10/31/2023 LOS: 4  Chief complaints Chief Complaint  Patient presents with   Fall   Hip Pain     Brief Narrative / Interim history: 78 year old F with PMH of ESRD on HD TTS, DM-2 with neuropathy, HTN, HLD, GERD, osteoporosis and malnutrition brought to [facility] due to right hip pain after she sustained a fall at home and admitted with right femoral fracture.  Reportedly fell when she was trying to get up to get some snack for low blood sugar.  No prodromes leading to this.  Did not strike head.  No LOC.  In ED, slightly hypertensive.  Labs without significant acute finding.  Hip x-ray showed severely angulated intertrochanteric fracture of proximal right femur.  Right shoulder, chest and right knee x-ray without acute finding.  CT head without acute finding.  Orthopedic surgery consulted and patient was transferred here.  Patient underwent cephalomedullary nailing of right intertrochanteric femur fracture by Dr. Curtiss Dowdy on 5/16.  Hgb dropped about 3 g postoperatively.  Reported EBL 100 cc.  No overt bleeding.  Transfused 1 unit and hemoglobin improved to 8.0.  Therapy recommended SNF.  Medically stable for discharge  Subjective: Seen and examined earlier this morning.  No major events overnight of this morning.  No complaints.  In bed eating breakfast.  Objective: Vitals:   11/03/23 1825 11/03/23 2000 11/04/23 0455 11/04/23 0737  BP: (!) 123/51 (!) 128/52 128/60 (!) 125/55  Pulse: 83 85 81 74  Resp: 16 16 16 18   Temp: 98.2 F (36.8 C) 97.9 F (36.6 C) 98 F (36.7 C) 98.3 F (36.8 C)  TempSrc: Oral   Oral  SpO2: 100% 100% 100% 99%  Weight:      Height:        Examination:  GENERAL: No apparent distress.  Appears frail. HEENT: MMM.  Vision and hearing grossly intact.  NECK: Supple.  No apparent JVD.  RESP:  No IWOB.  Fair aeration bilaterally. CVS:  RRR.  Heart sounds normal.  ABD/GI/GU: BS+. Abd soft, NTND.  MSK/EXT:  Moves left extremity.  Tender over right hip.  Dressing removed.  Small spot relatively fresh blood at surgical site proximally. SKIN: As above. NEURO: Awake and alert and oriented x 4 using the board on the wall.  Follows commands. PSYCH: Calm. Normal affect.  Consultants:  Orthopedic surgery  Procedures: 5/16-cephalomedullary nailing of right intertrochanteric femur fracture by Dr. Curtiss Dowdy  Microbiology summarized: None  Assessment and plan: Accidental fall at home: [facility] when she was trying to walk to get something to eat for low glucose. Closed right proximal femur fracture due to accidental fall and osteoporosis History of osteoporosis -S/p cephalomedullary nailing of right intertrochanteric femur fracture by Dr. Curtiss Dowdy on 5/16 -Vitamin D  level within normal. -Scheduled Tylenol  and decreased Norco -On full dose aspirin  for VTE prophylaxis per Ortho -Bowel regimen -PT/OT-recommended SNF  ESRD on HD TTS: Last HD on 5/13.  No emergent need but morning labs pending -Nephrology on board   Essential hypertension: Normotensive. -Continue holding home amlodipine   NIDDM-2 with hyperglycemia: A1c 5.7% (6.3% in 05/2023) Recent Labs  Lab 11/03/23 2002 11/04/23 0022 11/04/23 0504 11/04/23 0736 11/04/23 1156  GLUCAP 149* 123* 101* 116* 218*  - Continue SSI-very sensitive  Anemia of renal disease/possible postoperative blood loss anemia: Hgb dropped about 2 g postoperatively.  Reported EBL 100 cc.  Transfused 1  unit on 5/18. Recent Labs    12/19/22 1326 01/03/23 1313 08/09/23 1139 09/04/23 0921 10/31/23 1032 11/01/23 1058 11/01/23 1407 11/02/23 0625 11/03/23 1255 11/04/23 0836  HGB 12.4 12.2 10.4* 11.9* 9.6* 9.5* 9.9* 7.2* 6.9* 8.0*  -Monitor CBC  Hypokalemia: Resolved   GERD - Continue pantoprazole   Advance care planning: Full code.  See goal of care discussion on 5/17   Increased nutrient  needs Body mass index is 16.46 kg/m. Nutrition Problem: Increased nutrient needs Etiology: chronic illness, post-op healing Signs/Symptoms: estimated needs Interventions: Refer to RD note for recommendations, Nepro shake   DVT prophylaxis:  SCDs Start: 11/01/23 1649 Full dose aspirin  Code Status: Full code. Family Communication: None at bedside today. Level of care: Med-Surg Status is: Inpatient Remains inpatient appropriate because: SNF bed.   Final disposition: SNF   35 minutes with more than 50% spent in reviewing records, counseling patient/family and coordinating care.   Sch Meds:  Scheduled Meds:  acetaminophen   650 mg Oral Q6H WA   aspirin  EC  325 mg Oral Q breakfast   atorvastatin   10 mg Oral Daily   carvedilol   6.25 mg Oral BID WC   Chlorhexidine  Gluconate Cloth  6 each Topical Q0600   docusate sodium   100 mg Oral BID   feeding supplement (NEPRO CARB STEADY)  237 mL Oral BID BM   folic acid   1 mg Oral Daily   insulin  aspart  0-6 Units Subcutaneous Q4H   pantoprazole   40 mg Oral BID AC   Continuous Infusions:   PRN Meds:.fentaNYL  (SUBLIMAZE ) injection, HYDROcodone -acetaminophen , menthol -cetylpyridinium **OR** phenol, methocarbamol  **OR** methocarbamol  (ROBAXIN ) injection, metoCLOPramide  **OR** metoCLOPramide  (REGLAN ) injection, ondansetron  **OR** ondansetron  (ZOFRAN ) IV, senna-docusate  Antimicrobials: Anti-infectives (From admission, onward)    Start     Dose/Rate Route Frequency Ordered Stop   11/01/23 1745  ceFAZolin  (ANCEF ) IVPB 2g/100 mL premix  Status:  Discontinued        2 g 200 mL/hr over 30 Minutes Intravenous Every 8 hours 11/01/23 1648 11/01/23 1717   11/01/23 1045  ceFAZolin  (ANCEF ) IVPB 2g/100 mL premix        2 g 200 mL/hr over 30 Minutes Intravenous On call to O.R. 11/01/23 0947 11/01/23 1506        I have personally reviewed the following labs and images: CBC: Recent Labs  Lab 10/31/23 1032 11/01/23 1058 11/01/23 1407  11/02/23 0625 11/03/23 1255 11/04/23 0836  WBC 9.3 7.4  --  8.4 6.5 6.4  NEUTROABS 7.9*  --   --   --   --   --   HGB 9.6* 9.5* 9.9* 7.2* 6.9* 8.0*  HCT 29.8* 28.9* 29.0* 22.1* 20.7* 24.0*  MCV 92.0 90.6  --  91.7 91.2 88.6  PLT 168 181  --  155 157 182   BMP &GFR Recent Labs  Lab 10/31/23 1032 11/01/23 1407 11/01/23 1730 11/02/23 0625 11/03/23 1255 11/04/23 0836  NA 135 140 140 136 133* 133*  K 3.4* 3.8 3.8 3.4* 3.8 4.1  CL 100 102 103 103 96* 97*  CO2 24  --  21* 20* 27 25  GLUCOSE 210* 127* 145* 145* 187* 105*  BUN 36* 44* 48* 53* 36* 48*  CREATININE 3.72* 4.50* 4.49* 4.57* 3.56* 3.90*  CALCIUM  8.9  --  8.8* 7.3* 8.0* 8.4*  MG  --   --   --  1.8 1.8  --   PHOS  --   --  5.6* 5.2* 2.5 2.6   Estimated Creatinine Clearance: 9 mL/min (A) (  by C-G formula based on SCr of 3.9 mg/dL (H)). Liver & Pancreas: Recent Labs  Lab 10/31/23 1032 11/01/23 1730 11/02/23 0625 11/03/23 1255 11/04/23 0836  AST 24  --   --   --   --   ALT 16  --   --   --   --   ALKPHOS 64  --   --   --   --   BILITOT 0.9  --   --   --   --   PROT 8.0  --   --   --   --   ALBUMIN 2.9* 2.7* 2.0* 2.2* 2.0*   No results for input(s): "LIPASE", "AMYLASE" in the last 168 hours. No results for input(s): "AMMONIA" in the last 168 hours. Diabetic: No results for input(s): "HGBA1C" in the last 72 hours.  Recent Labs  Lab 11/03/23 2002 11/04/23 0022 11/04/23 0504 11/04/23 0736 11/04/23 1156  GLUCAP 149* 123* 101* 116* 218*   Cardiac Enzymes: No results for input(s): "CKTOTAL", "CKMB", "CKMBINDEX", "TROPONINI" in the last 168 hours. No results for input(s): "PROBNP" in the last 8760 hours. Coagulation Profile: No results for input(s): "INR", "PROTIME" in the last 168 hours. Thyroid  Function Tests: No results for input(s): "TSH", "T4TOTAL", "FREET4", "T3FREE", "THYROIDAB" in the last 72 hours.  Lipid Profile: No results for input(s): "CHOL", "HDL", "LDLCALC", "TRIG", "CHOLHDL", "LDLDIRECT" in  the last 72 hours. Anemia Panel: No results for input(s): "VITAMINB12", "FOLATE", "FERRITIN", "TIBC", "IRON", "RETICCTPCT" in the last 72 hours. Urine analysis:    Component Value Date/Time   COLORURINE STRAW (A) 08/09/2023 1455   APPEARANCEUR CLEAR 08/09/2023 1455   APPEARANCEUR Cloudy (A) 01/07/2023 1504   LABSPEC 1.008 08/09/2023 1455   PHURINE 6.0 08/09/2023 1455   GLUCOSEU NEGATIVE 08/09/2023 1455   HGBUR NEGATIVE 08/09/2023 1455   BILIRUBINUR NEGATIVE 08/09/2023 1455   BILIRUBINUR Negative 01/07/2023 1504   KETONESUR NEGATIVE 08/09/2023 1455   PROTEINUR 30 (A) 08/09/2023 1455   UROBILINOGEN 1.0 02/10/2012 0817   NITRITE NEGATIVE 08/09/2023 1455   LEUKOCYTESUR NEGATIVE 08/09/2023 1455   Sepsis Labs: Invalid input(s): "PROCALCITONIN", "LACTICIDVEN"  Microbiology: Recent Results (from the past 240 hours)  Surgical pcr screen     Status: Abnormal   Collection Time: 11/01/23 11:38 AM   Specimen: Nasal Mucosa; Nasal Swab  Result Value Ref Range Status   MRSA, PCR NEGATIVE NEGATIVE Final   Staphylococcus aureus POSITIVE (A) NEGATIVE Final    Comment: (NOTE) The Xpert SA Assay (FDA approved for NASAL specimens in patients 25 years of age and older), is one component of a comprehensive surveillance program. It is not intended to diagnose infection nor to guide or monitor treatment. Performed at [facility], [address], [place], [place] [address]     Radiology Studies: No results found.     Kimberlly Norgard T. [facility]ivon [facility]  If 7PM-7AM, please contact night-coverage [email or web address] 11/04/2023, 12:39 PM

## 2023-11-04 NOTE — Care Management Important Message (Signed)
 Important Message  Patient Details  Name: Erica Crosby MRN: [id number] Date of Birth: [birth year]   Important Message Given:  Yes - Medicare IM     Wynonia Hedges 11/04/2023, 3:29 PM

## 2023-11-04 NOTE — NC FL2 (Signed)
 [name]  MEDICAID FL2 LEVEL OF CARE FORM     IDENTIFICATION  Patient Name: Erica Crosby Birthdate: 1946/05/18 Sex: female Admission Date (Current Location): 10/31/2023  [facility] and [place] Number:  [employer] and Address:  [facility], [address], [place], [place] [address]      Provider Number: [id number]  Attending Physician Name and Address:  Theadore Finger, MD  Relative Name and Phone Number:  Seena Dadds (Daughter)  [phone]    Current Level of Care: Hospital Recommended Level of Care: Skilled Nursing Facility Prior Approval Number:    Date Approved/Denied:   PASRR Number: [id number]  Discharge Plan: SNF    Current Diagnoses: Patient Active Problem List   Diagnosis Date Noted   Closed right hip fracture (HCC) 10/31/2023   Physical deconditioning 09/18/2023   Unspecified protein-calorie malnutrition (HCC) 09/18/2023   Impaired mobility and ADLs 09/18/2023   Type 2 diabetes mellitus with diabetic neuropathy, unspecified (HCC) 09/09/2023   Hypothyroidism, unspecified 09/06/2023   Polyneuropathy, unspecified 09/06/2023   End stage renal disease (HCC) 06/14/2022   Nonrheumatic mitral valve regurgitation 02/26/2022   Nodule of kidney 02/11/2022   Gastro-esophageal reflux disease without esophagitis 02/10/2022   Anemia in chronic kidney disease 02/10/2022   Osteoporosis 03/08/2021   Other long term (current) drug therapy 03/08/2021   Rheumatoid lung (HCC) 03/08/2021   Hyperlipidemia, unspecified 07/07/2018   Palpitations 05/07/2018   Rheumatoid arthritis, unspecified (HCC) 03/20/2017   Personal history of noncompliance with medical treatment, presenting hazards to health 12/24/2016   Bulging lumbar disc 08/22/2016   Type 2 diabetes mellitus without complications (HCC) 02/10/2012   Ataxia 02/10/2012    Orientation RESPIRATION BLADDER Height & Weight     Self, Time, Situation, Place  Normal Incontinent Weight:  102 lb (46.3 kg) Height:  5\' 6"  (167.6 cm)  BEHAVIORAL SYMPTOMS/MOOD NEUROLOGICAL BOWEL NUTRITION STATUS      Continent Diet (see DC summary)  AMBULATORY STATUS COMMUNICATION OF NEEDS Skin   Extensive Assist Verbally Surgical wounds (11/01/23 Leg Right.incision closed)                       Personal Care Assistance Level of Assistance  Bathing, Feeding, Dressing Bathing Assistance: Maximum assistance Feeding assistance: Limited assistance Dressing Assistance: Maximum assistance     Functional Limitations Info  Sight, Hearing, Speech Sight Info: Adequate Hearing Info: Adequate Speech Info: Adequate    SPECIAL CARE FACTORS FREQUENCY  PT (By licensed PT), OT (By licensed OT)     PT Frequency: 5x/week OT Frequency: 5x/week            Contractures Contractures Info: Not present    Additional Factors Info  Code Status, Allergies Code Status Info: FULL Allergies Info: Iodinated Contrast Media Latex Lisinopril Shellfish Allergy           Current Medications (11/04/2023):  This is the current hospital active medication list Current Facility-Administered Medications  Medication Dose Route Frequency Provider Last Rate Last Admin   acetaminophen  (TYLENOL ) tablet 650 mg  650 mg Oral Q6H WA Gonfa, Taye T, MD   650 mg at 11/04/23 0759   aspirin  EC tablet 325 mg  325 mg Oral Q breakfast Versie Gores, PA-C   325 mg at 11/04/23 0601   atorvastatin  (LIPITOR) tablet 10 mg  10 mg Oral Daily Alona Jamaica A, PA-C   10 mg at 11/04/23 2952   carvedilol  (COREG ) tablet 6.25 mg  6.25 mg Oral BID WC  Versie Gores, PA-C   6.25 mg at 11/04/23 1191   Chlorhexidine  Gluconate Cloth 2 % PADS 6 each  6 each Topical Q0600 Chucky Craver, MD   6 each at 11/03/23 4782   docusate sodium  (COLACE) capsule 100 mg  100 mg Oral BID Versie Gores, PA-C   100 mg at 11/04/23 9562   feeding supplement (NEPRO CARB STEADY) liquid 237 mL  237 mL Oral BID BM Gonfa, Taye T, MD   237 mL at 11/04/23  1308   fentaNYL  (SUBLIMAZE ) injection 25 mcg  25 mcg Intravenous Q3H PRN Versie Gores, PA-C       folic acid  (FOLVITE ) tablet 1 mg  1 mg Oral Daily Versie Gores, PA-C   1 mg at 11/04/23 6578   HYDROcodone -acetaminophen  (NORCO/VICODIN) 5-325 MG per tablet 1 tablet  1 tablet Oral Q6H PRN Gonfa, Taye T, MD       insulin  aspart (novoLOG ) injection 0-6 Units  0-6 Units Subcutaneous Q4H Versie Gores, PA-C   1 Units at 11/03/23 1739   menthol -cetylpyridinium (CEPACOL) lozenge 3 mg  1 lozenge Oral PRN Versie Gores, PA-C       Or   phenol (CHLORASEPTIC) mouth spray 1 spray  1 spray Mouth/Throat PRN Versie Gores, PA-C       methocarbamol  (ROBAXIN ) tablet 500 mg  500 mg Oral Q6H PRN Jonelle Neri, Sarah A, PA-C       Or   methocarbamol  (ROBAXIN ) injection 500 mg  500 mg Intravenous Q6H PRN Jonelle Neri, Sarah A, PA-C       metoCLOPramide  (REGLAN ) tablet 5-10 mg  5-10 mg Oral Q8H PRN Jonelle Neri, Sarah A, PA-C       Or   metoCLOPramide  (REGLAN ) injection 5-10 mg  5-10 mg Intravenous Q8H PRN Versie Gores, PA-C       ondansetron  (ZOFRAN ) tablet 4 mg  4 mg Oral Q6H PRN Jonelle Neri, Sarah A, PA-C       Or   ondansetron  (ZOFRAN ) injection 4 mg  4 mg Intravenous Q6H PRN McClung, Sarah A, PA-C       pantoprazole  (PROTONIX ) EC tablet 40 mg  40 mg Oral BID AC McClung, Sarah A, PA-C   40 mg at 11/04/23 0800   senna-docusate (Senokot-S) tablet 1 tablet  1 tablet Oral BID PRN Gonfa, Taye T, MD         Discharge Medications: Please see discharge summary for a list of discharge medications.  Relevant Imaging Results:  Relevant Lab Results:   Additional Information ION:[id number]   outpatient HD: [facility] TTS 11:20am chair time  Shakura Cowing A [place], LCSW

## 2023-11-04 NOTE — Progress Notes (Signed)
 [facility] Progress Note   Subjective:   Last HD on 5/17 with 0 mL UF.   She states that she normally lives at home and has been told that they recommend a SNF.  States that they are currently using her tunneled catheter for dialysis.   Review of systems:  Denies shortness of breath or chest pain Denies n/v She has had a purewick on and she would like this taken off   Objective Vitals:   11/03/23 1810 11/03/23 1825 11/03/23 2000 11/04/23 0455  BP: (!) 114/52 (!) 123/51 (!) 128/52 128/60  Pulse: 78 83 85 81  Resp: 16 16 16 16   Temp: 98.3 F (36.8 C) 98.2 F (36.8 C) 97.9 F (36.6 C) 98 F (36.7 C)  TempSrc: Oral Oral    SpO2:  100% 100% 100%  Weight:      Height:       Physical Exam   General adult female in bed in no acute distress HEENT normocephalic atraumatic extraocular movements intact sclera anicteric Neck supple trachea midline Lungs clear to auscultation bilaterally normal work of breathing at rest  Heart S1S2 no rub Abdomen soft nontender nondistended Extremities no edema  Psych normal mood and affect Neuro - alert and conversant; follows commands and provides history  Access: RIJ tunn catheter; LUE AVF with bruit and thrill   Additional Objective Labs: Basic Metabolic Panel: Recent Labs  Lab 11/01/23 1730 11/02/23 0625 11/03/23 1255  NA 140 136 133*  K 3.8 3.4* 3.8  CL 103 103 96*  CO2 21* 20* 27  GLUCOSE 145* 145* 187*  BUN 48* 53* 36*  CREATININE 4.49* 4.57* 3.56*  CALCIUM  8.8* 7.3* 8.0*  PHOS 5.6* 5.2* 2.5   Liver Function Tests: Recent Labs  Lab 10/31/23 1032 11/01/23 1730 11/02/23 0625 11/03/23 1255  AST 24  --   --   --   ALT 16  --   --   --   ALKPHOS 64  --   --   --   BILITOT 0.9  --   --   --   PROT 8.0  --   --   --   ALBUMIN 2.9* 2.7* 2.0* 2.2*   No results for input(s): "LIPASE", "AMYLASE" in the last 168 hours. CBC: Recent Labs  Lab 10/31/23 1032 11/01/23 1058 11/01/23 1407 11/02/23 0625  11/03/23 1255  WBC 9.3 7.4  --  8.4 6.5  NEUTROABS 7.9*  --   --   --   --   HGB 9.6* 9.5* 9.9* 7.2* 6.9*  HCT 29.8* 28.9* 29.0* 22.1* 20.7*  MCV 92.0 90.6  --  91.7 91.2  PLT 168 181  --  155 157   Blood Culture    Component Value Date/Time   SDES RIGHT ANTECUBITAL 05/21/2019 1612   SPECREQUEST  05/21/2019 1612    BOTTLES DRAWN AEROBIC AND ANAEROBIC Blood Culture adequate volume   CULT  05/21/2019 1612    NO GROWTH 5 DAYS Performed at [facility], [address], [place], [place] [address]    REPTSTATUS 05/26/2019 FINAL 05/21/2019 1612    Cardiac Enzymes: No results for input(s): "CKTOTAL", "CKMB", "CKMBINDEX", "TROPONINI" in the last 168 hours. CBG: Recent Labs  Lab 11/03/23 1217 11/03/23 1728 11/03/23 2002 11/04/23 0022 11/04/23 0504  GLUCAP 200* 170* 149* 123* 101*   Iron Studies: No results for input(s): "IRON", "TIBC", "TRANSFERRIN", "FERRITIN" in the last 72 hours. @lablastinr3 @ Studies/Results: No results found.  Medications:   acetaminophen   650 mg Oral  Q6H WA   aspirin  EC  325 mg Oral Q breakfast   atorvastatin   10 mg Oral Daily   carvedilol   6.25 mg Oral BID WC   Chlorhexidine  Gluconate Cloth  6 each Topical Q0600   docusate sodium   100 mg Oral BID   feeding supplement (NEPRO CARB STEADY)  237 mL Oral BID BM   folic acid   1 mg Oral Daily   insulin  aspart  0-6 Units Subcutaneous Q4H   pantoprazole   40 mg Oral BID AC    Dialysis Orders: [facility] TTS (started march 2025)  3.5h  B300   45.5kg   TDC   Heparin  1500  Last HD 5/13, comes off 0-1.5 kg over, good compliance Bp's are low-normal to normal at OP HD Mircera 50mcg IV q 2 weeks- last dose 5/13  Assessment/Plan: R hip fracture: after fall at home.  SP ORIF on 5/16 per orthopedics.   Please choose an alternative to morphine  for pain given ESRD patient  ESRD: on HD TTS schedule. Renal panel today HTN: acceptable control  Anemia of esrd: PRBC's noted on 5/18.  ESA recently dosed outpatient on 5/13.  Transfuse PRN per primary team. Update CBC Secondary hyperparathyroidism: phos at goal.  DM2: per primary team   Disposition - per primary team.  Note plans for SNF placement - will discuss with HD SW to ensure she is aware   [name] 11/04/2023 6:45 AM

## 2023-11-04 NOTE — Progress Notes (Signed)
 Physical Therapy Treatment Patient Details Name: Erica Crosby MRN: [id number] DOB: 07-23-1945 Today's Date: 11/04/2023   History of Present Illness Pt is a 78 y.o. female admitted 5/15 following a fall at home. Imaging revealed R hip fx. She underwent IMN 5/16. PMH: ESRD on HD (TTS), HTN, T2DM, HLD, GERD.    PT Comments  Patient resting in bed, soiled in urine, and agreeable to mobilize with therapy for OOB. Cues for sequencing bed mobility required and mod assist with use of bed pad to pivot hips. Pt with minimal discomfort this date and tolerating transitional movements well. Pt able to maintain seated balance with 1-2 UE support at EOB, assist needed to scoot anteriorly and place feet on floor. Pt required cues for hand placement with power up from EOB. Min assist to rise to RW and steady while guiding turn bed>chair. Pt completed repeated sit<>stand from recliner with min assist and cues for hand position. Pt improved recall for technique as reps continued. EOS pt repositioned in recliner, Alarm on and call bell within reach. Patient will benefit from continued inpatient follow up therapy, <3 hours/day. Will continue to progress as able.    If plan is discharge home, recommend the following: Two people to help with walking and/or transfers;Two people to help with bathing/dressing/bathroom;Assistance with cooking/housework;Assist for transportation;Help with stairs or ramp for entrance   Can travel by private vehicle     No  Equipment Recommendations  Rolling walker (2 wheels);Wheelchair (measurements PT);Wheelchair cushion (measurements PT);Hospital bed    Recommendations for Other Services       Precautions / Restrictions Precautions Precautions: Fall Recall of Precautions/Restrictions: Impaired Restrictions Weight Bearing Restrictions Per Provider Order: No RLE Weight Bearing Per Provider Order: Weight bearing as tolerated     Mobility  Bed Mobility Overal bed mobility: Needs  Assistance Bed Mobility: Supine to Sit     Supine to sit: Mod assist, HOB elevated, Used rails     General bed mobility comments: Mod asssit with cues for use of bed rail, bed pad to pivot hips and scoot to edge.    Transfers Overall transfer level: Needs assistance Equipment used: Rolling walker (2 wheels) Transfers: Sit to/from Stand, Bed to chair/wheelchair/BSC Sit to Stand: Min assist   Step pivot transfers: Min assist       General transfer comment: cues for hand placement/technique. min assist to rise and steady. Min assist to guide turn bed>chair. repeated sit<>stand (5x) from recliner.    Ambulation/Gait                   Stairs             Wheelchair Mobility     Tilt Bed    Modified Rankin (Stroke Patients Only)       Balance Overall balance assessment: Needs assistance Sitting-balance support: Feet unsupported, Bilateral upper extremity supported Sitting balance-Leahy Scale: Fair   Postural control: Posterior lean Standing balance support: During functional activity, Reliant on assistive device for balance Standing balance-Leahy Scale: Poor Standing balance comment: reliant on RW                            Communication Communication Communication: No apparent difficulties  Cognition Arousal: Alert Behavior During Therapy: Flat affect   PT - Cognitive impairments: No family/caregiver present to determine baseline, Orientation, Awareness, Memory, Sequencing, Initiation, Problem solving, Safety/Judgement  Following commands: Impaired Following commands impaired: Follows one step commands inconsistently, Follows one step commands with increased time    Cueing Cueing Techniques: Verbal cues, Gestural cues, Tactile cues  Exercises      General Comments        Pertinent Vitals/Pain Pain Assessment Pain Assessment: Faces Faces Pain Scale: Hurts a little bit Pain Location: RLE Pain  Descriptors / Indicators: Discomfort Pain Intervention(s): Limited activity within patient's tolerance, Monitored during session, Repositioned    Home Living                          Prior Function            PT Goals (current goals can now be found in the care plan section) Acute Rehab PT Goals Patient Stated Goal: not stated PT Goal Formulation: With patient Time For Goal Achievement: 11/16/23 Potential to Achieve Goals: Good Progress towards PT goals: Progressing toward goals    Frequency    Min 2X/week      PT Plan      Co-evaluation              AM-PAC PT "6 Clicks" Mobility   Outcome Measure  Help needed turning from your back to your side while in a flat bed without using bedrails?: A Lot Help needed moving from lying on your back to sitting on the side of a flat bed without using bedrails?: A Lot Help needed moving to and from a bed to a chair (including a wheelchair)?: A Little Help needed standing up from a chair using your arms (e.g., wheelchair or bedside chair)?: A Little Help needed to walk in hospital room?: A Lot Help needed climbing 3-5 steps with a railing? : Total 6 Click Score: 13    End of Session Equipment Utilized During Treatment: Gait belt Activity Tolerance: Patient tolerated treatment well Patient left: in chair;with call bell/phone within reach;with chair alarm set Nurse Communication: Mobility status PT Visit Diagnosis: Muscle weakness (generalized) (M62.81);Difficulty in walking, not elsewhere classified (R26.2);Pain Pain - Right/Left: Right Pain - part of body: Hip     Time: 1610-9604 PT Time Calculation (min) (ACUTE ONLY): 25 min  Charges:    $Therapeutic Activity: 23-37 mins PT General Charges $$ ACUTE PT VISIT: 1 Visit                     Tish Forge, DPT Acute Rehabilitation Services Office [phone]  11/04/23 3:02 PM

## 2023-11-04 NOTE — TOC Initial Note (Signed)
 Transition of Care [facility]) - Initial/Assessment Note    Patient Details  Name: Erica Crosby MRN: [id number] Date of Birth: 1945/07/23  [facility] CM/SW Contact:    Zelena Bushong A [place], LCSW Phone Number: 11/04/2023, 11:18 AM  Clinical Narrative:                  CSW met with pt at bedside.Pt was agreeable to referral for SNF.  No preference. SNF workup completed. Bed offers pending.   Outpatient HD patient, pt stated that she attends [facility], chair time approximatley 11:20am TTS.   CSW to start authorization once bed offer accepted by pt, as pt is nearing medical stability.   TOC will continue to follow.   Expected Discharge Plan: Skilled Nursing Facility Barriers to Discharge: SNF Pending bed offer, Insurance Authorization   Patient Goals and CMS Choice            Expected Discharge Plan and Services                                              Prior Living Arrangements/Services              Need for Family Participation in Patient Care: No (Comment) Care giver support system in place?: Yes (comment) (pt's daughter, Nadine Aus)      Activities of Daily Living      Permission Sought/Granted                  Emotional Assessment Appearance:: Appears stated age Attitude/Demeanor/Rapport: Lethargic Affect (typically observed): Appropriate Orientation: : Oriented to Self, Oriented to Place, Oriented to  Time, Oriented to Situation Alcohol / Substance Use: Not Applicable Psych Involvement: No (comment)  Admission diagnosis:  Closed right hip fracture (HCC) [S72.001A] Closed right hip fracture, initial encounter (HCC) [S72.001A] Patient Active Problem List   Diagnosis Date Noted   Closed right hip fracture (HCC) 10/31/2023   Physical deconditioning 09/18/2023   Unspecified protein-calorie malnutrition (HCC) 09/18/2023   Impaired mobility and ADLs 09/18/2023   Type 2 diabetes mellitus with diabetic neuropathy,  unspecified (HCC) 09/09/2023   Hypothyroidism, unspecified 09/06/2023   Polyneuropathy, unspecified 09/06/2023   End stage renal disease (HCC) 06/14/2022   Nonrheumatic mitral valve regurgitation 02/26/2022   Nodule of kidney 02/11/2022   Gastro-esophageal reflux disease without esophagitis 02/10/2022   Anemia in chronic kidney disease 02/10/2022   Osteoporosis 03/08/2021   Other long term (current) drug therapy 03/08/2021   Rheumatoid lung (HCC) 03/08/2021   Hyperlipidemia, unspecified 07/07/2018   Palpitations 05/07/2018   Rheumatoid arthritis, unspecified (HCC) 03/20/2017   Personal history of noncompliance with medical treatment, presenting hazards to health 12/24/2016   Bulging lumbar disc 08/22/2016   Type 2 diabetes mellitus without complications (HCC) 02/10/2012   Ataxia 02/10/2012   PCP:  Eliodoro Guerin, DO Pharmacy:   [facility] [place], [place] - [address] [place] [address] Phone: [phone] Fax: [phone]  [facility] [place], [place] - [address] [place] [address] Phone: [phone] Fax: [phone]  [facility] [address] [place] [address] Phone: [phone] Fax: [phone]     Social Drivers of Health (SDOH) Social History: SDOH Screenings   Food Insecurity: No Food Insecurity (11/04/2023)  Housing:  Unknown (11/04/2023)  Transportation Needs: No Transportation Needs (11/04/2023)  Utilities: Not At Risk (11/04/2023)  Alcohol Screen: Low Risk  (11/16/2022)  Depression (PHQ2-9): Medium Risk (08/22/2023)  Financial Resource Strain: Low Risk  (11/16/2022)  Physical Activity: Insufficiently Active (11/16/2022)  Social Connections: Moderately Integrated (11/04/2023)  Stress: No Stress Concern Present (11/16/2022)  Tobacco Use: Medium Risk (11/01/2023)   SDOH Interventions:     Readmission Risk Interventions      No data to display

## 2023-11-04 NOTE — Progress Notes (Signed)
 Assume care at 0700. Pt was lying in the bed with eyes open, with even unlabored respirations, skin warm and dry to the touch. Pt call light was in reach and safety measures are in place.

## 2023-11-04 NOTE — Discharge Instructions (Signed)
 [facility] Discharge Instructions   General Discharge Instructions  WEIGHT BEARING STATUS:Weightbearing as tolerated  RANGE OF MOTION/ACTIVITY:Motion as tolerated  Wound Care: Incisions can be left open to air if there is no drainage. Once the incision is completely dry and without drainage, it may be left open to air out.  Showering may begin post op day 3, (Monday 11/05/23).  Clean incision gently with soap and water.  DVT/PE prophylaxis: Aspirin  81 mg daily  Diet: as you were eating previously.  Can use over the counter stool softeners and bowel preparations, such as Miralax, to help with bowel movements.  Narcotics can be constipating.  Be sure to drink plenty of fluids  PAIN MEDICATION USE AND EXPECTATIONS  You have likely been given narcotic medications to help control your pain.  After a traumatic event that results in an fracture (broken bone) with or without surgery, it is ok to use narcotic pain medications to help control one's pain.  We understand that everyone responds to pain differently and each individual patient will be evaluated on a regular basis for the continued need for narcotic medications. Ideally, narcotic medication use should last no more than 6-8 weeks (coinciding with fracture healing).   As a patient it is your responsibility as well to monitor narcotic medication use and report the amount and frequency you use these medications when you come to your office visit.   We would also advise that if you are using narcotic medications, you should take a dose prior to therapy to maximize you participation.  IF YOU ARE ON NARCOTIC MEDICATIONS IT IS NOT PERMISSIBLE TO OPERATE A MOTOR VEHICLE (MOTORCYCLE/CAR/TRUCK/MOPED) OR HEAVY MACHINERY DO NOT MIX NARCOTICS WITH OTHER CNS (CENTRAL NERVOUS SYSTEM) DEPRESSANTS SUCH AS ALCOHOL  POST-OPERATIVE OPIOID TAPER INSTRUCTIONS: It is important to wean off of your opioid medication as soon as possible. If you do  not need pain medication after your surgery it is ok to stop day one. Opioids include: Codeine, Hydrocodone (Norco, Vicodin), Oxycodone (Percocet, oxycontin ) and hydromorphone amongst others.  Long term and even short term use of opiods can cause: Increased pain response Dependence Constipation Depression Respiratory depression And more.  Withdrawal symptoms can include Flu like symptoms Nausea, vomiting And more Techniques to manage these symptoms Hydrate well Eat regular healthy meals Stay active Use relaxation techniques(deep breathing, meditating, yoga) Do Not substitute Alcohol to help with tapering If you have been on opioids for less than two weeks and do not have pain than it is ok to stop all together.  Plan to wean off of opioids This plan should start within one week post op of your fracture surgery  Maintain the same interval or time between taking each dose and first decrease the dose.  Cut the total daily intake of opioids by one tablet each day Next start to increase the time between doses. The last dose that should be eliminated is the evening dose.    STOP SMOKING OR USING NICOTINE PRODUCTS!!!!  As discussed nicotine severely impairs your body's ability to heal surgical and traumatic wounds but also impairs bone healing.  Wounds and bone heal by forming microscopic blood vessels (angiogenesis) and nicotine is a vasoconstrictor (essentially, shrinks blood vessels).  Therefore, if vasoconstriction occurs to these microscopic blood vessels they essentially disappear and are unable to deliver necessary nutrients to the healing tissue.  This is one modifiable factor that you can do to dramatically increase your chances of healing your injury.  (This means no smoking, no  nicotine gum, patches, etc)  DO NOT USE NONSTEROIDAL ANTI-INFLAMMATORY DRUGS (NSAID'S)  Using products such as Advil (ibuprofen), Aleve (naproxen), Motrin (ibuprofen) for additional pain control during  fracture healing can delay and/or prevent the healing response.  If you would like to take over the counter (OTC) medication, Tylenol  (acetaminophen ) is ok.  However, some narcotic medications that are given for pain control contain acetaminophen  as well. Therefore, you should not exceed more than 4000 mg of tylenol  in a day if you do not have liver disease.  Also note that there are may OTC medicines, such as cold medicines and allergy medicines that my contain tylenol  as well.  If you have any questions about medications and/or interactions please ask your doctor/PA or your pharmacist.      ICE AND ELEVATE INJURED/OPERATIVE EXTREMITY  Using ice and elevating the injured extremity above your heart can help with swelling and pain control.  Icing in a pulsatile fashion, such as 20 minutes on and 20 minutes off, can be followed.    Do not place ice directly on skin. Make sure there is a barrier between to skin and the ice pack.    Using frozen items such as frozen peas works well as the conform nicely to the are that needs to be iced.  USE AN ACE WRAP OR TED HOSE FOR SWELLING CONTROL  In addition to icing and elevation, Ace wraps or TED hose are used to help limit and resolve swelling.  It is recommended to use Ace wraps or TED hose until you are informed to stop.    When using Ace Wraps start the wrapping distally (farthest away from the body) and wrap proximally (closer to the body)   Example: If you had surgery on your leg or thing and you do not have a splint on, start the ace wrap at the toes and work your way up to the thigh        If you had surgery on your upper extremity and do not have a splint on, start the ace wrap at your fingers and work your way up to the upper arm  IF YOU ARE IN A SPLINT OR CAST DO NOT REMOVE IT FOR ANY REASON   If your splint gets wet for any reason please contact the office immediately. You may shower in your splint or cast as long as you keep it dry.  This can be done  by wrapping in a cast cover or garbage back (or similar)  Do Not stick any thing down your splint or cast such as pencils, money, or hangers to try and scratch yourself with.  If you feel itchy take benadryl  as prescribed on the bottle for itching  IF YOU ARE IN A CAM BOOT (BLACK BOOT)  You may remove boot periodically. Perform daily dressing changes as noted below.  Wash the liner of the boot regularly and wear a sock when wearing the boot. It is recommended that you sleep in the boot until told otherwise   CALL THE OFFICE FOR MEDICATION REFILLS OR WITH ANY QUESTIONS/CONCERNS: [phone]   VISIT OUR WEBSITE FOR ADDITIONAL INFORMATION: [email or web address]    Discharge Pin Site Instructions  Dress pins daily with Kerlix roll starting on POD 2. Wrap the Kerlix so that it tamps the skin down around the pin-skin interface to prevent/limit motion of the skin relative to the pin.  (Pin-skin motion is the primary cause of pain and infection related to external fixator pin sites).  Remove  any crust or coagulum that may obstruct drainage with a saline moistened gauze or soap and water.  After POD 3, if there is no discernable drainage on the pin site dressing, the interval for change can by increased to every other day.  You may shower with the fixator, cleaning all pin sites gently with soap and water.  If you have a surgical wound this needs to be completely dry and without drainage before showering.  The extremity can be lifted by the fixator to facilitate wound care and transfers.  Notify the office/Doctor if you experience increasing drainage, redness, or pain from a pin site, or if you notice purulent (thick, snot-like) drainage.   Discharge Wound Care Instructions  Do NOT apply any ointments, solutions or lotions to pin sites or surgical wounds.  These prevent needed drainage and even though solutions like hydrogen peroxide kill bacteria, they also damage cells lining the pin sites that  help fight infection.  Applying lotions or ointments can keep the wounds moist and can cause them to breakdown and open up as well. This can increase the risk for infection. When in doubt call the office.  Surgical incisions should be dressed daily.  If any drainage is noted, use one layer of adaptic or Mepitel, then gauze, Kerlix, and an ace wrap. - These dressing supplies should be available at local medical supply stores ([facility], [facility], etc) as well as Insurance claims handler ([facility], [facility], [place], etc)  Once the incision is completely dry and without drainage, it may be left open to air out.  Showering may begin 36-48 hours later.  Cleaning gently with soap and water.  Traumatic wounds should be dressed daily as well.    One layer of adaptic, gauze, Kerlix, then ace wrap.  The adaptic can be discontinued once the draining has ceased    If you have a wet to dry dressing: wet the gauze with saline the squeeze as much saline out so the gauze is moist (not soaking wet), place moistened gauze over wound, then place a dry gauze over the moist one, followed by Kerlix wrap, then ace wrap.    Call office for the following: Temperature greater than 101F Persistent nausea and vomiting Severe uncontrolled pain Redness, tenderness, or signs of infection (pain, swelling, redness, odor or green/yellow discharge around the site) Difficulty breathing, headache or visual disturbances Hives Persistent dizziness or light-headedness Extreme fatigue Any other questions or concerns you may have after discharge  In an emergency, call 911 or go to an Emergency Department at a nearby hospital  OTHER HELPFUL INFORMATION  If you had a block, it will wear off between 8-24 hrs postop typically.  This is period when your pain may go from nearly zero to the pain you would have had postop without the block.  This is an abrupt transition but nothing dangerous is happening.  You may take an extra dose  of narcotic when this happens.  You should wean off your narcotic medicines as soon as you are able.  Most patients will be off or using minimal narcotics before their first postop appointment.   We suggest you use the pain medication the first night prior to going to bed, in order to ease any pain when the anesthesia wears off. You should avoid taking pain medications on an empty stomach as it will make you nauseous.  Do not drink alcoholic beverages or take illicit drugs when taking pain medications.  In most states it is against  the law to drive while you are in a splint or sling.  And certainly against the law to drive while taking narcotics.  You may return to work/school in the next couple of days when you feel up to it.   Pain medication may make you constipated.  Below are a few solutions to try in this order: Decrease the amount of pain medication if you aren't having pain. Drink lots of decaffeinated fluids. Drink prune juice and/or each dried prunes  If the first 3 don't work start with additional solutions Take Colace - an over-the-counter stool softener Take Senokot - an over-the-counter laxative Take Miralax - a stronger over-the-counter laxative

## 2023-11-05 ENCOUNTER — Encounter (HOSPITAL_COMMUNITY): Payer: Self-pay | Admitting: Student

## 2023-11-05 ENCOUNTER — Inpatient Hospital Stay (HOSPITAL_COMMUNITY)

## 2023-11-05 DIAGNOSIS — N186 End stage renal disease: Secondary | ICD-10-CM | POA: Diagnosis not present

## 2023-11-05 DIAGNOSIS — S72001A Fracture of unspecified part of neck of right femur, initial encounter for closed fracture: Secondary | ICD-10-CM | POA: Diagnosis not present

## 2023-11-05 DIAGNOSIS — E46 Unspecified protein-calorie malnutrition: Secondary | ICD-10-CM | POA: Diagnosis not present

## 2023-11-05 DIAGNOSIS — I340 Nonrheumatic mitral (valve) insufficiency: Secondary | ICD-10-CM | POA: Diagnosis not present

## 2023-11-05 LAB — RENAL FUNCTION PANEL
Albumin: 2.1 g/dL — ABNORMAL LOW (ref 3.5–5.0)
Anion gap: 8 (ref 5–15)
BUN: 61 mg/dL — ABNORMAL HIGH (ref 8–23)
CO2: 27 mmol/L (ref 22–32)
Calcium: 8.6 mg/dL — ABNORMAL LOW (ref 8.9–10.3)
Chloride: 100 mmol/L (ref 98–111)
Creatinine, Ser: 4.18 mg/dL — ABNORMAL HIGH (ref 0.44–1.00)
GFR, Estimated: 10 mL/min — ABNORMAL LOW (ref >60)
Glucose, Bld: 117 mg/dL — ABNORMAL HIGH (ref 70–99)
Phosphorus: 2.5 mg/dL (ref 2.5–4.6)
Potassium: 4.3 mmol/L (ref 3.5–5.1)
Sodium: 135 mmol/L (ref 135–145)

## 2023-11-05 LAB — TROPONIN I (HIGH SENSITIVITY)
Troponin I (High Sensitivity): 19 ng/L — ABNORMAL HIGH (ref <18)
Troponin I (High Sensitivity): 17 ng/L (ref <18)

## 2023-11-05 LAB — CBC
HCT: 25.1 % — ABNORMAL LOW (ref 36.0–46.0)
Hemoglobin: 8.4 g/dL — ABNORMAL LOW (ref 12.0–15.0)
MCH: 29.7 pg (ref 26.0–34.0)
MCHC: 33.5 g/dL (ref 30.0–36.0)
MCV: 88.7 fL (ref 80.0–100.0)
Platelets: 199 10*3/uL (ref 150–400)
RBC: 2.83 MIL/uL — ABNORMAL LOW (ref 3.87–5.11)
RDW: 16.8 % — ABNORMAL HIGH (ref 11.5–15.5)
WBC: 6.8 10*3/uL (ref 4.0–10.5)
nRBC: 0 % (ref 0.0–0.2)

## 2023-11-05 LAB — GLUCOSE, CAPILLARY
Glucose-Capillary: 119 mg/dL — ABNORMAL HIGH (ref 70–99)
Glucose-Capillary: 119 mg/dL — ABNORMAL HIGH (ref 70–99)
Glucose-Capillary: 196 mg/dL — ABNORMAL HIGH (ref 70–99)
Glucose-Capillary: 117 mg/dL — ABNORMAL HIGH (ref 70–99)

## 2023-11-05 LAB — HEPATITIS B SURFACE ANTIBODY, QUANTITATIVE: Hep B S AB Quant (Post): <3.5 m[IU]/mL — ABNORMAL LOW

## 2023-11-05 MED ORDER — ALTEPLASE 2 MG IJ SOLR: 2.0000 mg | Freq: Once | INTRAMUSCULAR | Status: DC | PRN

## 2023-11-05 MED ORDER — ANTICOAGULANT SODIUM CITRATE 4% (200MG/5ML) IV SOLN: 5.0000 mL | Status: DC | PRN

## 2023-11-05 MED ORDER — HEPARIN SODIUM (PORCINE) 1000 UNIT/ML IJ SOLN
INTRAMUSCULAR | Status: AC
  Filled 2023-11-05: qty 4

## 2023-11-05 MED ORDER — LIDOCAINE-PRILOCAINE 2.5-2.5 % EX CREA: 1.0000 | TOPICAL_CREAM | CUTANEOUS | Status: DC | PRN

## 2023-11-05 MED ORDER — LIDOCAINE HCL (PF) 1 % IJ SOLN: 5.0000 mL | INTRAMUSCULAR | Status: DC | PRN

## 2023-11-05 MED ORDER — HEPARIN SODIUM (PORCINE) 1000 UNIT/ML DIALYSIS
1000.0000 [IU] | INTRAMUSCULAR | Status: DC | PRN
  Administered 2023-11-05: 3800 [IU]

## 2023-11-05 MED ORDER — PENTAFLUOROPROP-TETRAFLUOROETH EX AERO: 1.0000 | INHALATION_SPRAY | CUTANEOUS | Status: DC | PRN

## 2023-11-05 NOTE — Progress Notes (Signed)
 [facility] Progress Note   Subjective:   Last HD on 5/17 with 0 mL UF.   Strict ins/outs are not available.  She had 500 mL UOP over 5/19 as well as 3 unmeasured urine voids.  I spoke with HD SW yesterday to update her as to possible SNF dispo.  She states that she had chest pain this morning which resolved on its own.  She didn't tell anyone.  She states it was 8/10 and a squeezing type of pain and that this was after she had been given a bath which the nurse reports was this morning.  This has now resolved.  She also states she felt that her heart was racing. EKG was ordered just now.    Review of systems:    Reports chest pain - which has now thankfully resolved Denies shortness of breath Reports nausea     Objective Vitals:   11/04/23 0737 11/04/23 1636 11/04/23 1928 11/05/23 0452  BP: (!) 125/55 138/80 (!) 143/86 129/67  Pulse: 74 75 82 82  Resp: 18 18 18 18   Temp: 98.3 F (36.8 C)  98.7 F (37.1 C) 98.1 F (36.7 C)  TempSrc: Oral  Oral Oral  SpO2: 99% 100% 100% 99%  Weight:      Height:       Physical Exam    General adult female in bed in no acute distress HEENT normocephalic atraumatic extraocular movements intact sclera anicteric Neck supple trachea midline Lungs clear to auscultation bilaterally normal work of breathing at rest on room air Heart S1S2 no rub Abdomen soft nontender nondistended Extremities no edema  Psych normal mood and affect Neuro - alert and conversant; follows commands and provides history  Access: RIJ tunn catheter in place; LUE AVF with bruit and thrill   Additional Objective Labs: Basic Metabolic Panel: Recent Labs  Lab 11/02/23 0625 11/03/23 1255 11/04/23 0836  NA 136 133* 133*  K 3.4* 3.8 4.1  CL 103 96* 97*  CO2 20* 27 25  GLUCOSE 145* 187* 105*  BUN 53* 36* 48*  CREATININE 4.57* 3.56* 3.90*  CALCIUM  7.3* 8.0* 8.4*  PHOS 5.2* 2.5 2.6   Liver Function Tests: Recent Labs  Lab 10/31/23 1032 11/01/23 1730  11/02/23 0625 11/03/23 1255 11/04/23 0836  AST 24  --   --   --   --   ALT 16  --   --   --   --   ALKPHOS 64  --   --   --   --   BILITOT 0.9  --   --   --   --   PROT 8.0  --   --   --   --   ALBUMIN 2.9*   < > 2.0* 2.2* 2.0*   < > = values in this interval not displayed.   No results for input(s): "LIPASE", "AMYLASE" in the last 168 hours. CBC: Recent Labs  Lab 10/31/23 1032 11/01/23 1058 11/01/23 1407 11/02/23 0625 11/03/23 1255 11/04/23 0836  WBC 9.3 7.4  --  8.4 6.5 6.4  NEUTROABS 7.9*  --   --   --   --   --   HGB 9.6* 9.5*   < > 7.2* 6.9* 8.0*  HCT 29.8* 28.9*   < > 22.1* 20.7* 24.0*  MCV 92.0 90.6  --  91.7 91.2 88.6  PLT 168 181  --  155 157 182   < > = values in this interval not displayed.   Blood  Culture    Component Value Date/Time   SDES RIGHT ANTECUBITAL 05/21/2019 1612   SPECREQUEST  05/21/2019 1612    BOTTLES DRAWN AEROBIC AND ANAEROBIC Blood Culture adequate volume   CULT  05/21/2019 1612    NO GROWTH 5 DAYS Performed at [facility], [address] [place], [place] [address]    REPTSTATUS 05/26/2019 FINAL 05/21/2019 1612    Cardiac Enzymes: No results for input(s): "CKTOTAL", "CKMB", "CKMBINDEX", "TROPONINI" in the last 168 hours. CBG: Recent Labs  Lab 11/04/23 1156 11/04/23 1633 11/04/23 1933 11/04/23 2330 11/05/23 0454  GLUCAP 218* 141* 128* 122* 119*   Iron Studies: No results for input(s): "IRON", "TIBC", "TRANSFERRIN", "FERRITIN" in the last 72 hours. @lablastinr3 @ Studies/Results: No results found.  Medications:   acetaminophen   650 mg Oral Q6H WA   aspirin  EC  325 mg Oral Q breakfast   atorvastatin   10 mg Oral Daily   carvedilol   6.25 mg Oral BID WC   Chlorhexidine  Gluconate Cloth  6 each Topical Q0600   Chlorhexidine  Gluconate Cloth  6 each Topical Q0600   docusate sodium   100 mg Oral BID   feeding supplement (NEPRO CARB STEADY)  237 mL Oral BID BM   folic acid   1 mg Oral Daily   insulin  aspart  0-6 Units Subcutaneous  Q4H   pantoprazole   40 mg Oral BID AC    Dialysis Orders: [facility] TTS (started march 2025)  3.5h  B300   45.5kg   TDC   Heparin  1500  Last HD 5/13, comes off 0-1.5 kg over, good compliance Bp's are low-normal to normal at OP HD Mircera 50mcg IV q 2 weeks- last dose 5/13  Assessment/Plan: R hip fracture: after fall at home.  SP ORIF on 5/16 with orthopedics.   Chest pain - reaching out to her primary team.  Patient didn't notify RN overnight.  I see that stat EKG order is in.  We will delay her HD to 2nd shift at earliest to allow for primary team assessment.  I have spoken with the HD unit re: same ESRD: on HD TTS schedule. 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  ESA recently dosed outpatient on 5/13. Transfuse PRN per primary team.  Improved. Await today's labs Secondary hyperparathyroidism: phos at goal and not on a binder DM2: per primary team   Disposition - per primary team.  Note plans for SNF placement   [name] 11/05/2023 7:00 AM

## 2023-11-05 NOTE — TOC Progression Note (Addendum)
 Transition of Care [facility]) - Progression Note    Patient Details  Name: Erica Crosby MRN: [id number] Date of Birth: [birth year]  Transition of [facility] CM/SW Contact  Veida Spira A [place], LCSW Phone Number: 11/05/2023, 10:33 AM  Clinical Narrative:     Update 1616 CSW spoke with pt's daughter, Nadine Aus, updated on DC plan. No concerns or questions at this time.   Pt nearing medical stability, authorization started. Status pending.  [facility] can take pt tomorrow if authorization gets approved and remains stable for DC.      CSW met with pt at bedside and pt selected [facility] for rehab. Confirmed facility can transport pt to HD at pt's usual times. CSW updated Renal navigator of information. CSW to start insurance authorization.   CSW followed up with pt's daughter, left vm with contact information to reach back out to CSW.   TOC will continue to follow.   Expected Discharge Plan: Skilled Nursing Facility Barriers to Discharge: SNF Pending bed offer, Insurance Authorization  Expected Discharge Plan and Services                                               Social Determinants of Health (SDOH) Interventions SDOH Screenings   Food Insecurity: No Food Insecurity (11/04/2023)  Housing: Unknown (11/04/2023)  Transportation Needs: No Transportation Needs (11/04/2023)  Utilities: Not At Risk (11/04/2023)  Alcohol Screen: Low Risk  (11/16/2022)  Depression (PHQ2-9): Medium Risk (08/22/2023)  Financial Resource Strain: Low Risk  (11/16/2022)  Physical Activity: Insufficiently Active (11/16/2022)  Social Connections: Moderately Integrated (11/04/2023)  Stress: No Stress Concern Present (11/16/2022)  Tobacco Use: Medium Risk (11/01/2023)    Readmission Risk Interventions     No data to display

## 2023-11-05 NOTE — Progress Notes (Signed)
 PROGRESS NOTE  Erica Crosby NWG:[id number] DOB: 1946/01/05   PCP: Eliodoro Guerin, DO  Patient is from: Home.   DOA: 10/31/2023 LOS: 5  Chief complaints Chief Complaint  Patient presents with   Fall   Hip Pain     Brief Narrative / Interim history: 78 year old F with PMH of ESRD on HD TTS, DM-2 with neuropathy, HTN, HLD, GERD, osteoporosis and malnutrition brought to [facility] due to right hip pain after she sustained a fall at home and admitted with right femoral fracture.  Reportedly fell when she was trying to get up to get some snack for low blood sugar.  No prodromes leading to this.  Did not strike head.  No LOC.  In ED, slightly hypertensive.  Labs without significant acute finding.  Hip x-ray showed severely angulated intertrochanteric fracture of proximal right femur.  Right shoulder, chest and right knee x-ray without acute finding.  CT head without acute finding.  Orthopedic surgery consulted and patient was transferred here.  Patient underwent cephalomedullary nailing of right intertrochanteric femur fracture by Dr. Curtiss Dowdy on 5/16.  Hgb dropped about 3 g postoperatively.  Reported EBL 100 cc.  No overt bleeding.  Transfused 1 unit and hemoglobin improved to 8.0.  Therapy recommended SNF.  Medically stable for discharge  Subjective: Seen and examined earlier this morning.  Reported chest pain to staff earlier this morning.  She reports what looks like palpitation and also chest pressure.  She says it of the other night.  She says she does not want to tell me more since we think there is something wrong with her.  She denies pain now.  Patient has cognitive impairment.  EKG with chronic LBBB.  Troponin basically negative.  CXR suggested some vascular congestion.   Objective: Vitals:   11/04/23 1928 11/05/23 0452 11/05/23 0723 11/05/23 0728  BP: (!) 143/86 129/67 (!) 140/59 (!) 140/59  Pulse: 82 82 83 83  Resp: 18 18 18    Temp: 98.7 F (37.1 C) 98.1 F (36.7 C) 98.3 F  (36.8 C)   TempSrc: Oral Oral Oral   SpO2: 100% 99% 100%   Weight:      Height:        Examination:  GENERAL: No apparent distress.  Appears frail. HEENT: MMM.  Vision and hearing grossly intact.  NECK: Supple.  No apparent JVD.  RESP:  No IWOB.  Fair aeration bilaterally. CVS:  RRR. Heart sounds normal.  ABD/GI/GU: BS+. Abd soft, NTND.  MSK/EXT:  Moves left extremity.  Right hip wound DCI. SKIN: As above. NEURO: Sleepy but wakes to voice.  Oriented fairly but limited insight.  Follows commands. PSYCH: Calm. Normal affect.  Consultants:  Orthopedic surgery  Procedures: 5/16-cephalomedullary nailing of right intertrochanteric femur fracture by Dr. Curtiss Dowdy  Microbiology summarized: None  Assessment and plan: Accidental fall at home: [facility] when she was trying to walk to get something to eat for low glucose. Closed right proximal femur fracture due to accidental fall and osteoporosis History of osteoporosis -S/p cephalomedullary nailing of right intertrochanteric femur fracture by Dr. Curtiss Dowdy on 5/16 -Vitamin D  level within normal. -Scheduled Tylenol  and decreased Norco -On full dose aspirin  for VTE prophylaxis per Ortho -Bowel regimen -PT/OT-recommended SNF  Chest pain: Unclear timing.  Patient is not a great historian.  Currently chest pain-free.  EKG with chronic LBBB.  Troponin basically negative.  CXR suggested some vascular congestion.  Her BP slightly elevated today.  She may need ultrafiltration with dialysis -Continue home Coreg .  Already on aspirin . - Continue monitoring  ESRD on HD TTS: Last HD on 5/13.  No emergent need but morning labs pending -Nephrology on board.  May do some ultrafiltration.   Essential hypertension: BP slightly elevated. -Continue home Coreg . -Continue holding home amlodipine  in anticipation for ultrafiltration  NIDDM-2 with hyperglycemia: A1c 5.7% (6.3% in 05/2023) Recent Labs  Lab 11/04/23 1933 11/04/23 2330 11/05/23 0454  11/05/23 0722 11/05/23 1159  GLUCAP 128* 122* 119* 119* 196*  - Continue SSI-very sensitive  Anemia of renal disease/possible postoperative blood loss anemia: Hgb dropped about 2 g postoperatively.  Reported EBL 100 cc.  Stable after 1 unit on 5/18. Recent Labs    01/03/23 1313 08/09/23 1139 09/04/23 0921 10/31/23 1032 11/01/23 1058 11/01/23 1407 11/02/23 0625 11/03/23 1255 11/04/23 0836 11/05/23 0718  HGB 12.2 10.4* 11.9* 9.6* 9.5* 9.9* 7.2* 6.9* 8.0* 8.4*  -Monitor CBC  Hypokalemia: Resolved   GERD - Continue pantoprazole   Advance care planning: Full code.  See goal of care discussion on 5/17   Increased nutrient needs Body mass index is 16.46 kg/m. Nutrition Problem: Increased nutrient needs Etiology: chronic illness, post-op healing Signs/Symptoms: estimated needs Interventions: Refer to RD note for recommendations, Nepro shake   DVT prophylaxis:  SCDs Start: 11/01/23 1649 Full dose aspirin  Code Status: Full code. Family Communication: None at bedside today. Level of care: Med-Surg Status is: Inpatient Remains inpatient appropriate because: SNF bed.   Final disposition: SNF.  Medically stable for discharge   35 minutes with more than 50% spent in reviewing records, counseling patient/family and coordinating care.   Sch Meds:  Scheduled Meds:  acetaminophen   650 mg Oral Q6H WA   aspirin  EC  325 mg Oral Q breakfast   atorvastatin   10 mg Oral Daily   carvedilol   6.25 mg Oral BID WC   Chlorhexidine  Gluconate Cloth  6 each Topical Q0600   Chlorhexidine  Gluconate Cloth  6 each Topical Q0600   docusate sodium   100 mg Oral BID   feeding supplement (NEPRO CARB STEADY)  237 mL Oral BID BM   folic acid   1 mg Oral Daily   insulin  aspart  0-6 Units Subcutaneous Q4H   pantoprazole   40 mg Oral BID AC   Continuous Infusions:  anticoagulant sodium citrate       PRN Meds:.alteplase , anticoagulant sodium citrate , fentaNYL  (SUBLIMAZE ) injection, heparin ,  HYDROcodone -acetaminophen , lidocaine  (PF), lidocaine -prilocaine , menthol -cetylpyridinium **OR** phenol, methocarbamol  **OR** methocarbamol  (ROBAXIN ) injection, metoCLOPramide  **OR** metoCLOPramide  (REGLAN ) injection, ondansetron  **OR** ondansetron  (ZOFRAN ) IV, pentafluoroprop-tetrafluoroeth, senna-docusate  Antimicrobials: Anti-infectives (From admission, onward)    Start     Dose/Rate Route Frequency Ordered Stop   11/01/23 1745  ceFAZolin  (ANCEF ) IVPB 2g/100 mL premix  Status:  Discontinued        2 g 200 mL/hr over 30 Minutes Intravenous Every 8 hours 11/01/23 1648 11/01/23 1717   11/01/23 1045  ceFAZolin  (ANCEF ) IVPB 2g/100 mL premix        2 g 200 mL/hr over 30 Minutes Intravenous On call to O.R. 11/01/23 0947 11/01/23 1506        I have personally reviewed the following labs and images: CBC: Recent Labs  Lab 10/31/23 1032 11/01/23 1058 11/01/23 1407 11/02/23 0625 11/03/23 1255 11/04/23 0836 11/05/23 0718  WBC 9.3 7.4  --  8.4 6.5 6.4 6.8  NEUTROABS 7.9*  --   --   --   --   --   --   HGB 9.6* 9.5* 9.9* 7.2* 6.9* 8.0* 8.4*  HCT 29.8* 28.9* 29.0* 22.1*  20.7* 24.0* 25.1*  MCV 92.0 90.6  --  91.7 91.2 88.6 88.7  PLT 168 181  --  155 157 182 199   BMP &GFR Recent Labs  Lab 11/01/23 1730 11/02/23 0625 11/03/23 1255 11/04/23 0836 11/05/23 0718  NA 140 136 133* 133* 135  K 3.8 3.4* 3.8 4.1 4.3  CL 103 103 96* 97* 100  CO2 21* 20* 27 25 27   GLUCOSE 145* 145* 187* 105* 117*  BUN 48* 53* 36* 48* 61*  CREATININE 4.49* 4.57* 3.56* 3.90* 4.18*  CALCIUM  8.8* 7.3* 8.0* 8.4* 8.6*  MG  --  1.8 1.8  --   --   PHOS 5.6* 5.2* 2.5 2.6 2.5   Estimated Creatinine Clearance: 8.4 mL/min (A) (by C-G formula based on SCr of 4.18 mg/dL (H)). Liver & Pancreas: Recent Labs  Lab 10/31/23 1032 11/01/23 1730 11/02/23 0625 11/03/23 1255 11/04/23 0836 11/05/23 0718  AST 24  --   --   --   --   --   ALT 16  --   --   --   --   --   ALKPHOS 64  --   --   --   --   --   BILITOT 0.9   --   --   --   --   --   PROT 8.0  --   --   --   --   --   ALBUMIN 2.9* 2.7* 2.0* 2.2* 2.0* 2.1*   No results for input(s): "LIPASE", "AMYLASE" in the last 168 hours. No results for input(s): "AMMONIA" in the last 168 hours. Diabetic: No results for input(s): "HGBA1C" in the last 72 hours.  Recent Labs  Lab 11/04/23 1933 11/04/23 2330 11/05/23 0454 11/05/23 0722 11/05/23 1159  GLUCAP 128* 122* 119* 119* 196*   Cardiac Enzymes: No results for input(s): "CKTOTAL", "CKMB", "CKMBINDEX", "TROPONINI" in the last 168 hours. No results for input(s): "PROBNP" in the last 8760 hours. Coagulation Profile: No results for input(s): "INR", "PROTIME" in the last 168 hours. Thyroid  Function Tests: No results for input(s): "TSH", "T4TOTAL", "FREET4", "T3FREE", "THYROIDAB" in the last 72 hours.  Lipid Profile: No results for input(s): "CHOL", "HDL", "LDLCALC", "TRIG", "CHOLHDL", "LDLDIRECT" in the last 72 hours. Anemia Panel: No results for input(s): "VITAMINB12", "FOLATE", "FERRITIN", "TIBC", "IRON", "RETICCTPCT" in the last 72 hours. Urine analysis:    Component Value Date/Time   COLORURINE STRAW (A) 08/09/2023 1455   APPEARANCEUR CLEAR 08/09/2023 1455   APPEARANCEUR Cloudy (A) 01/07/2023 1504   LABSPEC 1.008 08/09/2023 1455   PHURINE 6.0 08/09/2023 1455   GLUCOSEU NEGATIVE 08/09/2023 1455   HGBUR NEGATIVE 08/09/2023 1455   BILIRUBINUR NEGATIVE 08/09/2023 1455   BILIRUBINUR Negative 01/07/2023 1504   KETONESUR NEGATIVE 08/09/2023 1455   PROTEINUR 30 (A) 08/09/2023 1455   UROBILINOGEN 1.0 02/10/2012 0817   NITRITE NEGATIVE 08/09/2023 1455   LEUKOCYTESUR NEGATIVE 08/09/2023 1455   Sepsis Labs: Invalid input(s): "PROCALCITONIN", "LACTICIDVEN"  Microbiology: Recent Results (from the past 240 hours)  Surgical pcr screen     Status: Abnormal   Collection Time: 11/01/23 11:38 AM   Specimen: Nasal Mucosa; Nasal Swab  Result Value Ref Range Status   MRSA, PCR NEGATIVE NEGATIVE Final    Staphylococcus aureus POSITIVE (A) NEGATIVE Final    Comment: (NOTE) The Xpert SA Assay (FDA approved for NASAL specimens in patients 24 years of age and older), is one component of a comprehensive surveillance program. It is not intended to diagnose infection nor to  guide or monitor treatment. Performed at [facility], [address] [place], [place] [address]     Radiology Studies: DG Chest Port 1 View Result Date: 11/05/2023 CLINICAL DATA:  Chest pain EXAM: PORTABLE CHEST 1 VIEW COMPARISON:  Chest x-ray performed Oct 31, 2023 FINDINGS: Right-sided hemodialysis catheter. Mild interstitial prominence. Heart mediastinum are not significantly changed. No pleural effusion or pneumothorax. IMPRESSION: 1. Mild central pulmonary vascular congestion. Electronically Signed   By: Reagan Camera M.D.   On: 11/05/2023 08:31       Taher Vannote [facility]  If 7PM-7AM, please contact night-coverage [email or web address] 11/05/2023, 2:16 PM

## 2023-11-05 NOTE — Progress Notes (Signed)
 Assume care at 0700. Pt lying in bed with eyes open. Respirations even and unlabored. Skin was warm and dry to the touch. Pt monitored during the shift no immediate concerns noted, ongoing assessment with no changes observed at this time.

## 2023-11-05 NOTE — Progress Notes (Signed)
 [facility] night cross cover note:   I was notified by the patient's RN  that the patient had experienced an episode of self-limited chest pain around 5 AM this morning, but that the patient didn't convey to staff that she had experienced this episode of CP until approximately. Chest pain resolved spontaneously, and the patient is currently chest pain free, and is otherwise asymptomatic at this time. Most recent VS appear stable. I have ordered STAT EKG as well as cxr to further evaluate.     Camelia Cavalier, DO Hospitalist

## 2023-11-05 NOTE — Plan of Care (Signed)
  Problem: Education: Goal: Knowledge of General Education information will improve Description: Including pain rating scale, medication(s)/side effects and non-pharmacologic comfort measures Outcome: Progressing   Problem: Health Behavior/Discharge Planning: Goal: Ability to manage health-related needs will improve Outcome: Progressing   Problem: Clinical Measurements: Goal: Ability to maintain clinical measurements within normal limits will improve Outcome: Progressing Goal: Will remain free from infection Outcome: Progressing Goal: Diagnostic test results will improve Outcome: Progressing Goal: Respiratory complications will improve Outcome: Progressing Goal: Cardiovascular complication will be avoided Outcome: Progressing   Problem: Activity: Goal: Risk for activity intolerance will decrease Outcome: Progressing   Problem: Nutrition: Goal: Adequate nutrition will be maintained Outcome: Progressing   Problem: Coping: Goal: Level of anxiety will decrease Outcome: Progressing   Problem: Elimination: Goal: Will not experience complications related to bowel motility Outcome: Progressing Goal: Will not experience complications related to urinary retention Outcome: Progressing   Problem: Pain Managment: Goal: General experience of comfort will improve and/or be controlled Outcome: Progressing   Problem: Safety: Goal: Ability to remain free from injury will improve Outcome: Progressing   Problem: Skin Integrity: Goal: Risk for impaired skin integrity will decrease Outcome: Progressing   Problem: Education: Goal: Ability to describe self-care measures that may prevent or decrease complications (Diabetes Survival Skills Education) will improve Outcome: Progressing Goal: Individualized Educational Video(s) Outcome: Progressing   Problem: Coping: Goal: Ability to adjust to condition or change in health will improve Outcome: Progressing   Problem: Fluid  Volume: Goal: Ability to maintain a balanced intake and output will improve Outcome: Progressing   Problem: Health Behavior/Discharge Planning: Goal: Ability to identify and utilize available resources and services will improve Outcome: Progressing Goal: Ability to manage health-related needs will improve Outcome: Progressing   Problem: Metabolic: Goal: Ability to maintain appropriate glucose levels will improve Outcome: Progressing   Problem: Nutritional: Goal: Maintenance of adequate nutrition will improve Outcome: Progressing Goal: Progress toward achieving an optimal weight will improve Outcome: Progressing   Problem: Skin Integrity: Goal: Risk for impaired skin integrity will decrease Outcome: Progressing   Problem: Tissue Perfusion: Goal: Adequacy of tissue perfusion will improve Outcome: Progressing   Problem: Education: Goal: Verbalization of understanding the information provided (i.e., activity precautions, restrictions, etc) will improve Outcome: Progressing Goal: Individualized Educational Video(s) Outcome: Progressing   Problem: Activity: Goal: Ability to ambulate and perform ADLs will improve Outcome: Progressing   Problem: Clinical Measurements: Goal: Postoperative complications will be avoided or minimized Outcome: Progressing   Problem: Self-Concept: Goal: Ability to maintain and perform role responsibilities to the fullest extent possible will improve Outcome: Progressing   Problem: Pain Management: Goal: Pain level will decrease Outcome: Progressing

## 2023-11-05 NOTE — Progress Notes (Signed)
 HD Note:  Some information was entered later than the data was gathered due to patient care needs. The stated time with the data is accurate.  Received patient in bed to unit.   Alert and oriented.   Informed consent verified and in chart.   Access used: Right TDC Access issues: None  Patient tolerated treatment well.   TX duration: 3.5 hours  Alert, without acute distress.  Total UF removed: 0.5L  Hand-off given to patient's nurse Chrstina  Transported back to the room   Barron Lien RN [facility].

## 2023-11-06 ENCOUNTER — Encounter (HOSPITAL_COMMUNITY): Payer: Self-pay | Admitting: Student

## 2023-11-06 DIAGNOSIS — Z7409 Other reduced mobility: Secondary | ICD-10-CM

## 2023-11-06 DIAGNOSIS — E7849 Other hyperlipidemia: Secondary | ICD-10-CM | POA: Diagnosis not present

## 2023-11-06 DIAGNOSIS — R2689 Other abnormalities of gait and mobility: Secondary | ICD-10-CM | POA: Diagnosis not present

## 2023-11-06 DIAGNOSIS — S72141D Displaced intertrochanteric fracture of right femur, subsequent encounter for closed fracture with routine healing: Secondary | ICD-10-CM | POA: Diagnosis not present

## 2023-11-06 DIAGNOSIS — R6889 Other general symptoms and signs: Secondary | ICD-10-CM | POA: Diagnosis not present

## 2023-11-06 DIAGNOSIS — S72001A Fracture of unspecified part of neck of right femur, initial encounter for closed fracture: Secondary | ICD-10-CM | POA: Diagnosis not present

## 2023-11-06 DIAGNOSIS — Z789 Other specified health status: Secondary | ICD-10-CM | POA: Diagnosis not present

## 2023-11-06 DIAGNOSIS — Z7401 Bed confinement status: Secondary | ICD-10-CM | POA: Diagnosis not present

## 2023-11-06 DIAGNOSIS — K219 Gastro-esophageal reflux disease without esophagitis: Secondary | ICD-10-CM | POA: Diagnosis not present

## 2023-11-06 DIAGNOSIS — E119 Type 2 diabetes mellitus without complications: Secondary | ICD-10-CM | POA: Diagnosis not present

## 2023-11-06 DIAGNOSIS — D631 Anemia in chronic kidney disease: Secondary | ICD-10-CM | POA: Diagnosis not present

## 2023-11-06 DIAGNOSIS — M62561 Muscle wasting and atrophy, not elsewhere classified, right lower leg: Secondary | ICD-10-CM | POA: Diagnosis not present

## 2023-11-06 DIAGNOSIS — Z743 Need for continuous supervision: Secondary | ICD-10-CM | POA: Diagnosis not present

## 2023-11-06 DIAGNOSIS — R5381 Other malaise: Secondary | ICD-10-CM | POA: Diagnosis not present

## 2023-11-06 DIAGNOSIS — M62562 Muscle wasting and atrophy, not elsewhere classified, left lower leg: Secondary | ICD-10-CM | POA: Diagnosis not present

## 2023-11-06 DIAGNOSIS — M069 Rheumatoid arthritis, unspecified: Secondary | ICD-10-CM | POA: Diagnosis not present

## 2023-11-06 DIAGNOSIS — Z9181 History of falling: Secondary | ICD-10-CM | POA: Diagnosis not present

## 2023-11-06 DIAGNOSIS — R52 Pain, unspecified: Secondary | ICD-10-CM | POA: Diagnosis not present

## 2023-11-06 DIAGNOSIS — K5900 Constipation, unspecified: Secondary | ICD-10-CM | POA: Diagnosis not present

## 2023-11-06 DIAGNOSIS — D689 Coagulation defect, unspecified: Secondary | ICD-10-CM | POA: Diagnosis not present

## 2023-11-06 DIAGNOSIS — R410 Disorientation, unspecified: Secondary | ICD-10-CM | POA: Diagnosis not present

## 2023-11-06 DIAGNOSIS — E46 Unspecified protein-calorie malnutrition: Secondary | ICD-10-CM | POA: Diagnosis not present

## 2023-11-06 DIAGNOSIS — S72001D Fracture of unspecified part of neck of right femur, subsequent encounter for closed fracture with routine healing: Secondary | ICD-10-CM | POA: Diagnosis not present

## 2023-11-06 DIAGNOSIS — I10 Essential (primary) hypertension: Secondary | ICD-10-CM | POA: Diagnosis not present

## 2023-11-06 DIAGNOSIS — S7291XA Unspecified fracture of right femur, initial encounter for closed fracture: Secondary | ICD-10-CM | POA: Diagnosis not present

## 2023-11-06 DIAGNOSIS — N2581 Secondary hyperparathyroidism of renal origin: Secondary | ICD-10-CM | POA: Diagnosis not present

## 2023-11-06 DIAGNOSIS — R531 Weakness: Secondary | ICD-10-CM | POA: Diagnosis not present

## 2023-11-06 DIAGNOSIS — E785 Hyperlipidemia, unspecified: Secondary | ICD-10-CM | POA: Diagnosis not present

## 2023-11-06 DIAGNOSIS — I2510 Atherosclerotic heart disease of native coronary artery without angina pectoris: Secondary | ICD-10-CM | POA: Diagnosis not present

## 2023-11-06 DIAGNOSIS — E1140 Type 2 diabetes mellitus with diabetic neuropathy, unspecified: Secondary | ICD-10-CM | POA: Diagnosis not present

## 2023-11-06 DIAGNOSIS — Z992 Dependence on renal dialysis: Secondary | ICD-10-CM | POA: Diagnosis not present

## 2023-11-06 DIAGNOSIS — E039 Hypothyroidism, unspecified: Secondary | ICD-10-CM | POA: Diagnosis not present

## 2023-11-06 DIAGNOSIS — M6281 Muscle weakness (generalized): Secondary | ICD-10-CM | POA: Diagnosis not present

## 2023-11-06 DIAGNOSIS — M810 Age-related osteoporosis without current pathological fracture: Secondary | ICD-10-CM | POA: Diagnosis not present

## 2023-11-06 DIAGNOSIS — B957 Other staphylococcus as the cause of diseases classified elsewhere: Secondary | ICD-10-CM | POA: Diagnosis not present

## 2023-11-06 DIAGNOSIS — Z23 Encounter for immunization: Secondary | ICD-10-CM | POA: Diagnosis not present

## 2023-11-06 DIAGNOSIS — Z741 Need for assistance with personal care: Secondary | ICD-10-CM | POA: Diagnosis not present

## 2023-11-06 DIAGNOSIS — D509 Iron deficiency anemia, unspecified: Secondary | ICD-10-CM | POA: Diagnosis not present

## 2023-11-06 DIAGNOSIS — I120 Hypertensive chronic kidney disease with stage 5 chronic kidney disease or end stage renal disease: Secondary | ICD-10-CM | POA: Diagnosis not present

## 2023-11-06 DIAGNOSIS — N186 End stage renal disease: Secondary | ICD-10-CM | POA: Diagnosis not present

## 2023-11-06 DIAGNOSIS — G8929 Other chronic pain: Secondary | ICD-10-CM | POA: Diagnosis not present

## 2023-11-06 DIAGNOSIS — E1122 Type 2 diabetes mellitus with diabetic chronic kidney disease: Secondary | ICD-10-CM | POA: Diagnosis not present

## 2023-11-06 LAB — RENAL FUNCTION PANEL
Albumin: 2.1 g/dL — ABNORMAL LOW (ref 3.5–5.0)
Anion gap: 5 (ref 5–15)
BUN: 26 mg/dL — ABNORMAL HIGH (ref 8–23)
CO2: 30 mmol/L (ref 22–32)
Calcium: 8.3 mg/dL — ABNORMAL LOW (ref 8.9–10.3)
Chloride: 99 mmol/L (ref 98–111)
Creatinine, Ser: 2.25 mg/dL — ABNORMAL HIGH (ref 0.44–1.00)
GFR, Estimated: 22 mL/min — ABNORMAL LOW (ref >60)
Glucose, Bld: 99 mg/dL (ref 70–99)
Phosphorus: 2.2 mg/dL — ABNORMAL LOW (ref 2.5–4.6)
Potassium: 3.9 mmol/L (ref 3.5–5.1)
Sodium: 134 mmol/L — ABNORMAL LOW (ref 135–145)

## 2023-11-06 LAB — MISC LABCORP TEST (SEND OUT): Labcorp test code: 6510

## 2023-11-06 LAB — GLUCOSE, CAPILLARY
Glucose-Capillary: 136 mg/dL — ABNORMAL HIGH (ref 70–99)
Glucose-Capillary: 137 mg/dL — ABNORMAL HIGH (ref 70–99)
Glucose-Capillary: 156 mg/dL — ABNORMAL HIGH (ref 70–99)
Glucose-Capillary: 161 mg/dL — ABNORMAL HIGH (ref 70–99)
Glucose-Capillary: 170 mg/dL — ABNORMAL HIGH (ref 70–99)
Glucose-Capillary: 92 mg/dL (ref 70–99)

## 2023-11-06 LAB — HEPATITIS B SURFACE ANTIGEN: Hepatitis B Surface Ag: NONREACTIVE

## 2023-11-06 LAB — HEPATITIS B SURFACE ANTIBODY, QUANTITATIVE: Hep B S AB Quant (Post): <3.5 m[IU]/mL — ABNORMAL LOW

## 2023-11-06 MED ORDER — LANTUS SOLOSTAR 100 UNIT/ML ~~LOC~~ SOPN: 5.0000 [IU] | PEN_INJECTOR | Freq: Every day | SUBCUTANEOUS | Status: AC

## 2023-11-06 MED ORDER — SENNOSIDES-DOCUSATE SODIUM 8.6-50 MG PO TABS: 1.0000 | ORAL_TABLET | Freq: Two times a day (BID) | ORAL | Status: DC | PRN

## 2023-11-06 MED ORDER — NEPRO/CARBSTEADY PO LIQD: 237.0000 mL | Freq: Two times a day (BID) | ORAL | Status: DC

## 2023-11-06 MED ORDER — CHLORHEXIDINE GLUCONATE CLOTH 2 % EX PADS: 6.0000 | MEDICATED_PAD | Freq: Every day | CUTANEOUS | Status: DC

## 2023-11-06 NOTE — Progress Notes (Signed)
 Physical Therapy Treatment Patient Details Name: Erica Crosby MRN: [id number] DOB: Mar 17, 1946 Today's Date: 11/06/2023   History of Present Illness Pt is a 78 y.o. female admitted 5/15 following a fall at home. Imaging revealed R hip fx. She underwent IMN 5/16. PMH: ESRD on HD (TTS), HTN, T2DM, HLD, GERD.    PT Comments  Pt making steady progress with mobility and was able to amb in her room a short distance today. Patient will benefit from continued inpatient follow up therapy, <3 hours/day to regain independence.     If plan is discharge home, recommend the following: A little help with walking and/or transfers;A lot of help with bathing/dressing/bathroom;Assist for transportation;Help with stairs or ramp for entrance;Assistance with cooking/housework   Can travel by private vehicle     No  Equipment Recommendations  Rolling walker (2 wheels)    Recommendations for Other Services       Precautions / Restrictions Precautions Precautions: Fall Recall of Precautions/Restrictions: Impaired Restrictions Weight Bearing Restrictions Per Provider Order: No RLE Weight Bearing Per Provider Order: Weight bearing as tolerated     Mobility  Bed Mobility               General bed mobility comments: Pt up in chair    Transfers Overall transfer level: Needs assistance Equipment used: Rolling walker (2 wheels) Transfers: Sit to/from Stand Sit to Stand: Min assist           General transfer comment: Assist to power up and for balance. Verbal cues for hand placement    Ambulation/Gait Ambulation/Gait assistance: Min assist Gait Distance (Feet): 15 Feet Assistive device: Rolling walker (2 wheels) Gait Pattern/deviations: Step-to pattern, Decreased step length - right, Decreased step length - left, Decreased stance time - right, Trunk flexed Gait velocity: decr Gait velocity interpretation: <1.31 ft/sec, indicative of household ambulator   General Gait Details: Assist for  balance and support   Stairs             Wheelchair Mobility     Tilt Bed    Modified Rankin (Stroke Patients Only)       Balance Overall balance assessment: Needs assistance Sitting-balance support: No upper extremity supported, Feet supported Sitting balance-Leahy Scale: Fair     Standing balance support: Bilateral upper extremity supported, During functional activity, Reliant on assistive device for balance Standing balance-Leahy Scale: Poor Standing balance comment: RW and CGA for static standing                            Communication Communication Communication: No apparent difficulties  Cognition Arousal: Alert Behavior During Therapy: WFL for tasks assessed/performed   PT - Cognitive impairments: No family/caregiver present to determine baseline                         Following commands: Impaired Following commands impaired: Follows one step commands with increased time, Only follows one step commands consistently    Cueing Cueing Techniques: Verbal cues, Gestural cues, Tactile cues  Exercises      General Comments General comments (skin integrity, edema, etc.): VSS on RA      Pertinent Vitals/Pain Pain Assessment Pain Assessment: Faces Faces Pain Scale: Hurts a little bit Pain Location: R LE Pain Descriptors / Indicators: Discomfort, Sore Pain Intervention(s): Limited activity within patient's tolerance, Monitored during session, Repositioned    Home Living  Prior Function            PT Goals (current goals can now be found in the care plan section) Progress towards PT goals: Progressing toward goals    Frequency    Min 2X/week      PT Plan      Co-evaluation              AM-PAC PT "6 Clicks" Mobility   Outcome Measure  Help needed turning from your back to your side while in a flat bed without using bedrails?: A Lot Help needed moving from lying on your back  to sitting on the side of a flat bed without using bedrails?: A Lot Help needed moving to and from a bed to a chair (including a wheelchair)?: A Little Help needed standing up from a chair using your arms (e.g., wheelchair or bedside chair)?: A Little Help needed to walk in hospital room?: Total Help needed climbing 3-5 steps with a railing? : Total 6 Click Score: 12    End of Session Equipment Utilized During Treatment: Gait belt Activity Tolerance: Patient tolerated treatment well Patient left: in chair;with call bell/phone within reach;with chair alarm set Nurse Communication: Mobility status PT Visit Diagnosis: Muscle weakness (generalized) (M62.81);Difficulty in walking, not elsewhere classified (R26.2);Pain Pain - Right/Left: Right Pain - part of body: Hip     Time: 1540-1556 PT Time Calculation (min) (ACUTE ONLY): 16 min  Charges:    $Gait Training: 8-22 mins PT General Charges $$ ACUTE PT VISIT: 1 Visit                     [facility] PT Acute Rehabilitation Services Office [phone]    Pura [facility] 11/06/2023, 4:02 PM

## 2023-11-06 NOTE — Progress Notes (Signed)
 Occupational Therapy Treatment Patient Details Name: Erica Crosby MRN: [id number] DOB: Oct 05, 1945 Today's Date: 11/06/2023   History of present illness Pt is a 78 y.o. female admitted 5/15 following a fall at home. Imaging revealed R hip fx. She underwent IMN 5/16. PMH: ESRD on HD (TTS), HTN, T2DM, HLD, GERD.   OT comments  Pt making good progress with functional goals. Pt required multimodal cues for initiation, task continuation, sequencing and safety. Pt easily distracted, hyperverbose, pleasant and cooperative. OT will continue to follow acutely to maximize level of function and safety      If plan is discharge home, recommend the following:  A lot of help with walking and/or transfers;A lot of help with bathing/dressing/bathroom;Assistance with cooking/housework;Direct supervision/assist for medications management;Direct supervision/assist for financial management;Assist for transportation;Help with stairs or ramp for entrance   Equipment Recommendations  Other (comment) (defer)    Recommendations for Other Services      Precautions / Restrictions Precautions Precautions: Fall Recall of Precautions/Restrictions: Impaired Restrictions Weight Bearing Restrictions Per Provider Order: No RLE Weight Bearing Per Provider Order: Weight bearing as tolerated       Mobility Bed Mobility Overal bed mobility: Needs Assistance       Supine to sit: Mod assist, HOB elevated, Used rails     General bed mobility comments: Mod asssit with cues for use of bed rail, sequencing of LE advancement. Mod A with trunk elevation and LEs to/off EOB, increased time    Transfers Overall transfer level: Needs assistance Equipment used: Rolling walker (2 wheels) Transfers: Sit to/from Stand, Bed to chair/wheelchair/BSC Sit to Stand: Min assist     Step pivot transfers: Min assist     General transfer comment: cues for hand placement/technique, min A to stand and to steady     Balance  Overall balance assessment: Needs assistance Sitting-balance support: Feet unsupported, Bilateral upper extremity supported Sitting balance-Leahy Scale: Fair   Postural control: Posterior lean Standing balance support: During functional activity, Reliant on assistive device for balance Standing balance-Leahy Scale: Poor                             ADL either performed or assessed with clinical judgement   ADL Overall ADL's : Needs assistance/impaired     Grooming: Wash/dry hands;Wash/dry face;Sitting           Upper Body Dressing : Contact guard assist;Sitting       Toilet Transfer: Moderate assistance;Stand-pivot;Rolling walker (2 wheels);BSC/3in1;Cueing for safety;Cueing for sequencing   Toileting- Clothing Manipulation and Hygiene: Maximal assistance       Functional mobility during ADLs: Moderate assistance;Rolling walker (2 wheels);Cueing for safety;Cueing for sequencing      Extremity/Trunk Assessment Upper Extremity Assessment Upper Extremity Assessment: Generalized weakness   Lower Extremity Assessment Lower Extremity Assessment: Defer to PT evaluation        Vision Ability to See in Adequate Light: 0 Adequate Patient Visual Report: No change from baseline     Perception     Praxis     Communication Communication Communication: No apparent difficulties   Cognition Arousal: Alert Behavior During Therapy: WFL for tasks assessed/performed               OT - Cognition Comments: increased cues for initaition of tasks/mobility, unaware that mesh brief wet                 Following commands: Impaired Following commands impaired: Follows one step commands  inconsistently, Follows one step commands with increased time      Cueing   Cueing Techniques: Verbal cues, Gestural cues, Tactile cues  Exercises      Shoulder Instructions       General Comments      Pertinent Vitals/ Pain       Pain Assessment Pain Assessment:  No/denies pain Faces Pain Scale: Hurts a little bit Pain Location: R LE Pain Descriptors / Indicators: Discomfort, Sore Pain Intervention(s): Monitored during session, Repositioned  Home Living                                          Prior Functioning/Environment              Frequency  Min 2X/week        Progress Toward Goals  OT Goals(current goals can now be found in the care plan section)  Progress towards OT goals: Progressing toward goals     Plan      Co-evaluation                 AM-PAC OT "6 Clicks" Daily Activity     Outcome Measure   Help from another person eating meals?: None Help from another person taking care of personal grooming?: A Little Help from another person toileting, which includes using toliet, bedpan, or urinal?: A Lot Help from another person bathing (including washing, rinsing, drying)?: A Lot Help from another person to put on and taking off regular upper body clothing?: A Little Help from another person to put on and taking off regular lower body clothing?: A Lot 6 Click Score: 16    End of Session Equipment Utilized During Treatment: Gait belt;Rolling walker (2 wheels);Other (comment) (BSC)  OT Visit Diagnosis: Unsteadiness on feet (R26.81);Other abnormalities of gait and mobility (R26.89);Muscle weakness (generalized) (M62.81);History of falling (Z91.81)   Activity Tolerance Patient tolerated treatment well   Patient Left with call bell/phone within reach;in chair;with chair alarm set   Nurse Communication Mobility status        Time: 1610-9604 OT Time Calculation (min): 24 min  Charges: OT General Charges $OT Visit: 1 Visit OT Treatments $Self Care/Home Management : 8-22 mins $Therapeutic Activity: 8-22 mins    Alfred Ann 11/06/2023, 3:45 PM

## 2023-11-06 NOTE — Progress Notes (Signed)
 PTAR at bedside to transport pt for discharge. All belongings returned to pt, all appropriate LDAs removed. VS stable.

## 2023-11-06 NOTE — Progress Notes (Addendum)
  [facility] Progress Note   Subjective:   Last HD on 5/20 with 0.5 kg.   Strict ins/outs are not available.  She had one unmeasured urine void over 5/20.  Had HD 2nd shift yesterday after primary team had been able to evaluate her for chest pain overnight which had resolved without intervention.  Team is concerned about memory impairment.  She states that her daughter helps her with decisions.  RN is going to help her call her daughter today.   Review of systems:     Denies chest pain Denies shortness of breath Reports nausea; BM this AM    Objective Vitals:   11/05/23 2019 11/05/23 2030 11/05/23 2159 11/06/23 0500  BP: (!) 119/53 (!) 131/53 (!) 131/53 124/65  Pulse: 91 88 90 82  Resp: 13 16 18 18   Temp:  98.5 F (36.9 C) 99.6 F (37.6 C) 98.3 F (36.8 C)  TempSrc:  Oral Oral Oral  SpO2: 100% 100% 100% 100%  Weight:      Height:       Physical Exam     General adult female in bed in no acute distress HEENT normocephalic atraumatic extraocular movements intact sclera anicteric Neck supple trachea midline Lungs clear to auscultation bilaterally normal work of breathing at rest on room air Heart S1S2 no rub Abdomen soft nontender nondistended Extremities no edema  Psych normal mood and affect Neuro - alert and oriented to person and location, states Erica Crosby is written on the wall.  She has a delay in providing year then states 2025.  Follows commands  Access: RIJ tunn catheter in place; LUE AVF with bruit and thrill   Additional Objective Labs: Basic Metabolic Panel: Recent Labs  Lab 11/03/23 1255 11/04/23 0836 11/05/23 0718  NA 133* 133* 135  K 3.8 4.1 4.3  CL 96* 97* 100  CO2 27 25 27   GLUCOSE 187* 105* 117*  BUN 36* 48* 61*  CREATININE 3.56* 3.90* 4.18*  CALCIUM  8.0* 8.4* 8.6*  PHOS 2.5 2.6 2.5   Liver Function Tests: Recent Labs  Lab 10/31/23 1032 11/01/23 1730 11/03/23 1255 11/04/23 0836 11/05/23 0718  AST 24  --   --   --   --   ALT 16   --   --   --   --   ALKPHOS 64  --   --   --   --   BILITOT 0.9  --   --   --   --   PROT 8.0  --   --   --   --   ALBUMIN 2.9*   < > 2.2* 2.0* 2.1*   < > = values in this interval not displayed.   No results for input(s): "LIPASE", "AMYLASE" in the last 168 hours. CBC: Recent Labs  Lab 10/31/23 1032 11/01/23 1058 11/01/23 1407 11/02/23 0625 11/03/23 1255 11/04/23 0836 11/05/23 0718  WBC 9.3 7.4  --  8.4 6.5 6.4 6.8  NEUTROABS 7.9*  --   --   --   --   --   --   HGB 9.6* 9.5*   < > 7.2* 6.9* 8.0* 8.4*  HCT 29.8* 28.9*   < > 22.1* 20.7* 24.0* 25.1*  MCV 92.0 90.6  --  91.7 91.2 88.6 88.7  PLT 168 181  --  155 157 182 199   < > = values in this interval not displayed.   Blood Culture    Component Value Date/Time   SDES RIGHT ANTECUBITAL 05/21/2019  1612   SPECREQUEST  05/21/2019 1612    BOTTLES DRAWN AEROBIC AND ANAEROBIC Blood Culture adequate volume   CULT  05/21/2019 1612    NO GROWTH 5 DAYS Performed at [facility], [address], [place], [place] [address]    REPTSTATUS 05/26/2019 FINAL 05/21/2019 1612    Cardiac Enzymes: No results for input(s): "CKTOTAL", "CKMB", "CKMBINDEX", "TROPONINI" in the last 168 hours. CBG: Recent Labs  Lab 11/05/23 0722 11/05/23 1159 11/05/23 2201 11/06/23 0007 11/06/23 0411  GLUCAP 119* 196* 117* 161* 137*   Iron Studies: No results for input(s): "IRON", "TIBC", "TRANSFERRIN", "FERRITIN" in the last 72 hours. @lablastinr3 @ Studies/Results: DG Chest Port 1 View Result Date: 11/05/2023 CLINICAL DATA:  Chest pain EXAM: PORTABLE CHEST 1 VIEW COMPARISON:  Chest x-ray performed Oct 31, 2023 FINDINGS: Right-sided hemodialysis catheter. Mild interstitial prominence. Heart mediastinum are not significantly changed. No pleural effusion or pneumothorax. IMPRESSION: 1. Mild central pulmonary vascular congestion. Electronically Signed   By: Erica Crosby M.D.   On: 11/05/2023 08:31    Medications:   acetaminophen   650 mg Oral Q6H WA    aspirin  EC  325 mg Oral Q breakfast   atorvastatin   10 mg Oral Daily   carvedilol   6.25 mg Oral BID WC   Chlorhexidine  Gluconate Cloth  6 each Topical Q0600   Chlorhexidine  Gluconate Cloth  6 each Topical Q0600   docusate sodium   100 mg Oral BID   feeding supplement (NEPRO CARB STEADY)  237 mL Oral BID BM   folic acid   1 mg Oral Daily   insulin  aspart  0-6 Units Subcutaneous Q4H   pantoprazole   40 mg Oral BID AC    Dialysis Orders: [facility] TTS (started march 2025)  3.5h  B300   45.5kg   TDC   Heparin  1500  Last HD 5/13, comes off 0-1.5 kg over, good compliance Bp's are low-normal to normal at OP HD Mircera 50mcg IV q 2 weeks- last dose 5/13  Assessment/Plan: R hip fracture: after fall at home.  SP ORIF on 5/16 with orthopedics.   Chest pain - self-resolving and patient is somewhat poor historian. evaluated by primary team, troponin negative and EKG with chronic LBBB.  Appreciate primary team   ESRD: on HD TTS schedule. Will follow her AM labs. HTN: acceptable control  Anemia of esrd: PRBC's noted on 5/18.  ESA recently dosed outpatient on 5/13. Transfuse PRN per primary team. Secondary hyperparathyroidism: phos at goal and not on a binder DM2: per primary team   Disposition - per primary team.  Note plans for SNF placement   Erica Crosby 11/06/2023 6:56 AM   Addendum:  Erica Crosby with her daughter per patient request.  Discussed her care as above.  She had no questions and appreciated the call.    Erica Aver, MD 6:59 AM 11/06/2023

## 2023-11-06 NOTE — Discharge Summary (Signed)
 Physician Discharge Summary  Erica Crosby NWG:[id number] DOB: 10/18/1945 DOA: 10/31/2023  PCP: Eliodoro Guerin, DO  Admit date: 10/31/2023 Discharge date: 11/06/23  Admitted From: Home Disposition: SNF Recommendations for Outpatient Follow-up:  Outpatient follow-up with PCP and orthopedic surgery as below Check CMP and CBC in 1 week Please follow up on the following pending results: None  Discharge Condition: Stable CODE STATUS: Full code  Follow-up Information     Haddix, Florentina Huntsman, MD. Schedule an appointment as soon as possible for a visit in 2 week(s).   Specialty: Orthopedic Surgery Why: for wound check and repeat x-rays Contact information: [address] [place] [address] [phone]                 Hospital course 78 year old F with PMH of ESRD on HD TTS, DM-2 with neuropathy, HTN, HLD, GERD, osteoporosis and malnutrition brought to [facility] ED due to right hip pain after she sustained a fall at home and admitted with right femoral fracture.  Reportedly fell when she was trying to get up to get some snack for low blood sugar.  No prodromes leading to this.  Did not strike head.  No LOC.   In ED, slightly hypertensive.  Labs without significant acute finding.  Hip x-ray showed severely angulated intertrochanteric fracture of proximal right femur.  Right shoulder, chest and right knee x-ray without acute finding.  CT head without acute finding.  Orthopedic surgery consulted and patient was transferred here.   Patient underwent cephalomedullary nailing of right intertrochanteric femur fracture by Dr. Curtiss Dowdy on 5/16.  Hgb dropped about 3 g postoperatively.  Reported EBL 100 cc.  No overt bleeding.  Transfused 1 unit and hemoglobin improved to 8.0 and remained stable.  Therapy recommended SNF.    See individual problem list below for more.   Problems addressed during this hospitalization Accidental fall at home: Marvell Slider when she was trying to walk to get something  to eat for low glucose. Closed right proximal femur fracture due to accidental fall and osteoporosis History of osteoporosis -S/p cephalomedullary nailing of right intertrochanteric femur fracture by Dr. Curtiss Dowdy on 5/16 -Vitamin D  level 92. -Norco and Tylenol  for pain control -Aspirin  81 mg daily for VTE prophylaxis per Ortho -Bowel regimen -Outpatient follow-up with orthopedic surgery as above   Chest pain: Unclear timing but resolved.  Patient is not a great historian.  EKG with chronic LBBB.  Troponin negative x 2.  CXR suggested some vascular congestion.   -Continue home Coreg .   -Already on aspirin .   ESRD on HD TTS: Last HD on 5/13.  No emergent need but morning labs pending -HD per nephrology.   Essential hypertension: Normotensive. -Continue home Coreg .  Does not take amlodipine .   NIDDM-2 with hyperglycemia: A1c 5.7% (6.3% in 05/2023) -Decreased home Lantus  from 10 to 5 units daily.   Anemia of renal disease/possible postoperative blood loss anemia: Hgb dropped about 2 g postoperatively.  Reported EBL 100 cc.  Stable after 1 unit on 5/18. Recent Labs    01/03/23 1313 08/09/23 1139 09/04/23 0921 10/31/23 1032 11/01/23 1058 11/01/23 1407 11/02/23 0625 11/03/23 1255 11/04/23 0836 11/05/23 0718  HGB 12.2 10.4* 11.9* 9.6* 9.5* 9.9* 7.2* 6.9* 8.0* 8.4*  - Recheck CBC in 1 week  Hypokalemia: Resolved   GERD - Continue pantoprazole    Advance care planning: Full code.  Discussed with patient's daughter on 5/17.   Increased nutrient needs Nutrition Problem: Increased nutrient needs Etiology: chronic illness, post-op healing  Signs/Symptoms: estimated needs Interventions: Refer to RD note for recommendations, Nepro shake     Time spent 35 minutes  Vital signs Vitals:   11/05/23 2030 11/05/23 2159 11/06/23 0500 11/06/23 0725  BP: (!) 131/53 (!) 131/53 124/65 (!) 121/48  Pulse: 88 90 82 79  Temp: 98.5 F (36.9 C) 99.6 F (37.6 C) 98.3 F (36.8 C) 98.2 F  (36.8 C)  Resp: 16 18 18    Height:      Weight:      SpO2: 100% 100% 100% 100%  TempSrc: Oral Oral Oral Oral  BMI (Calculated):         Discharge exam  GENERAL: No apparent distress.  Appears frail. HEENT: MMM.  Vision and hearing grossly intact.  NECK: Supple.  No apparent JVD.  RESP:  No IWOB.  Fair aeration bilaterally. CVS:  RRR. Heart sounds normal.  ABD/GI/GU: BS+. Abd soft, NTND.  MSK/EXT:  Moves left extremity.  Right hip wound DCI. SKIN: As above. NEURO: Sleepy but wakes to voice.  Oriented fairly but limited insight.  Follows commands. PSYCH: Calm. Normal affect.  Discharge Instructions Discharge Instructions     Diet - low sodium heart healthy   Complete by: As directed    Diet Carb Modified   Complete by: As directed    Increase activity slowly   Complete by: As directed       Allergies as of 11/06/2023       Reactions   Iodinated Contrast Media    Unknown reaction    Latex    Unknown reaction    Lisinopril Swelling   lips   Shellfish Allergy    Unknown Reaction         Medication List     STOP taking these medications    amLODipine  2.5 MG tablet Commonly known as: NORVASC    Fish Oil 1000 MG Caps   Vitamin D -3 125 MCG (5000 UT) Tabs       TAKE these medications    acetaminophen  500 MG tablet Commonly known as: TYLENOL  Take 500 mg by mouth every 6 (six) hours as needed for moderate pain (pain score 4-6).   aspirin  EC 81 MG tablet Take 81 mg by mouth daily.   atorvastatin  10 MG tablet Commonly known as: LIPITOR Take 1 tablet (10 mg total) by mouth daily.   carvedilol  6.25 MG tablet Commonly known as: COREG  Take 6.25 mg by mouth 2 (two) times daily.   chlorhexidine  4 % external liquid Commonly known as: HIBICLENS  Apply 15 mLs (1 Application total) topically as directed for 30 doses. Use as directed daily for 5 days every other week for 6 weeks.   feeding supplement (NEPRO CARB STEADY) Liqd Take 237 mLs by mouth 2 (two)  times daily between meals.   folic acid  1 MG tablet Commonly known as: FOLVITE  Take 1 tablet (1 mg total) by mouth daily.   [name] 2 Sensor Misc USE TO TEST BLOOD SUGAR CONTINUOUSLY. Dx: E11.65. ADVANCED DIABETES SUPPLY VIA PARACHUTE   HYDROcodone -acetaminophen  5-325 MG tablet Commonly known as: NORCO/VICODIN Take 1 tablet by mouth every 6 (six) hours as needed for severe pain (pain score 7-10).   Lantus  SoloStar 100 UNIT/ML Solostar Pen Generic drug: insulin  glargine Inject 5 Units into the skin daily. What changed:  how much to take when to take this   mupirocin  ointment 2 % Commonly known as: BACTROBAN  Place 1 Application into the nose 2 (two) times daily for 60 doses. Use as directed 2 times  daily for 5 days every other week for 6 weeks.   pantoprazole  40 MG tablet Commonly known as: PROTONIX  TAKE ONE TABLET DAILY   senna-docusate 8.6-50 MG tablet Commonly known as: Senokot-S Take 1 tablet by mouth 2 (two) times daily as needed for mild constipation.        Consultations: Orthopedic surgery Nephrology  Procedures/Studies: 5/16-cephalomedullary nailing of right intertrochanteric femur fracture by Dr. Geni Keto Chest Port 1 View Result Date: 11/05/2023 CLINICAL DATA:  Chest pain EXAM: PORTABLE CHEST 1 VIEW COMPARISON:  Chest x-ray performed Oct 31, 2023 FINDINGS: Right-sided hemodialysis catheter. Mild interstitial prominence. Heart mediastinum are not significantly changed. No pleural effusion or pneumothorax. IMPRESSION: 1. Mild central pulmonary vascular congestion. Electronically Signed   By: Reagan Camera M.D.   On: 11/05/2023 08:31   DG HIP PORT UNILAT W OR W/O PELVIS 1V RIGHT Result Date: 11/01/2023 CLINICAL DATA:  [address] Fracture [id number] EXAM: DG HIP (WITH OR WITHOUT PELVIS) 1V PORT RIGHT COMPARISON:  Oct 31, 2023 FINDINGS: Osteopenia. Status post intramedullary rod fixation of the RIGHT proximal femur. Orthopedic hardware is intact and without  periprosthetic fracture or lucency. Improved alignment of a inter trochanteric RIGHT femoral fracture. No new fractures identified. Atherosclerotic calcifications. Soft tissue air. Sacrum is obscured by overlapping bowel contents. IMPRESSION: Expected postsurgical appearance status post intramedullary rod fixation of the RIGHT femur. Electronically Signed   By: Clancy Crimes M.D.   On: 11/01/2023 16:33   DG FEMUR, MIN 2 VIEWS RIGHT Result Date: 11/01/2023 CLINICAL DATA:  [id number] Elective surgery [id number] EXAM: RIGHT FEMUR 2 VIEWS COMPARISON:  Oct 31, 2023 FINDINGS: Spot fluoroscopy images were obtained for surgical planning purposes. These demonstrate patient is status post intramedullary rod fixation of the RIGHT proximal femur. Improved alignment of a inter trochanteric fracture. Time: 52 seconds Dose: 4.76 mGy Please reference procedure report for further details. IMPRESSION: Fluoroscopic images for surgical planning purposes. Electronically Signed   By: Clancy Crimes M.D.   On: 11/01/2023 16:31   DG C-Arm 1-60 Min-No Report Result Date: 11/01/2023 Fluoroscopy was utilized by the requesting physician.  No radiographic interpretation.   DG Knee 1-2 Views Right Result Date: 11/01/2023 CLINICAL DATA:  Right knee pain after fall. EXAM: RIGHT KNEE - 1-2 VIEW COMPARISON:  None Available. FINDINGS: No evidence of fracture, dislocation, or joint effusion. No evidence of arthropathy or other focal bone abnormality. Soft tissues are unremarkable. IMPRESSION: Negative. Electronically Signed   By: Rosalene Colon M.D.   On: 11/01/2023 11:10   CT Head Wo Contrast Result Date: 10/31/2023 CLINICAL DATA:  Head trauma, minor (Age >= 65y) EXAM: CT HEAD WITHOUT CONTRAST TECHNIQUE: Contiguous axial images were obtained from the base of the skull through the vertex without intravenous contrast. RADIATION DOSE REDUCTION: This exam was performed according to the departmental dose-optimization program which includes  automated exposure control, adjustment of the mA and/or kV according to patient size and/or use of iterative reconstruction technique. COMPARISON:  March 08, 2024, May 05, 2021 FINDINGS: Brain: Proportional prominence of the ventricles and sulci, consistent with diffuse cerebral parenchymal volume loss. The ventricles otherwise maintained midline position without midline shift. 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 The basilar cisterns are patent without downward herniation. The cerebellar hemispheres and vermis are well formed without mass lesion or focal attenuation abnormality. Vascular: No hyperdense vessel. Calcified  atherosclerotic plaque within the cavernous/supraclinoid ICA and intradural vertebral arteries. Skull: Normal. Negative for fracture or focal lesion. Sinuses/Orbits: The paranasal sinuses and mastoids are clear. The globes appear intact. No retrobulbar hematoma. Other: None. IMPRESSION: 1. No acute intracranial abnormality, specifically, no acute hemorrhage, territorial infarction, or intracranial mass. 2. Cerebral volume loss with redemonstrated findings of chronic ischemic microvascular disease. Electronically Signed   By: Rance Burrows M.D.   On: 10/31/2023 11:07   DG Shoulder Right Result Date: 10/31/2023 CLINICAL DATA:  Right shoulder pain after fall today. EXAM: RIGHT SHOULDER - 2+ VIEW COMPARISON:  None Available. FINDINGS: There is no evidence of fracture or dislocation. Mild degenerative changes seen involving the right acromioclavicular joint. Soft tissues are unremarkable. IMPRESSION: No acute abnormality seen. Electronically Signed   By: Rosalene Colon M.D.   On: 10/31/2023 10:48   DG Chest Port 1 View Result Date:  10/31/2023 CLINICAL DATA:  Fall today. EXAM: PORTABLE CHEST 1 VIEW COMPARISON:  September 04, 2023. FINDINGS: The heart size and mediastinal contours are within normal limits. Right internal jugular dialysis catheter is unchanged. Both lungs are clear. The visualized skeletal structures are unremarkable. IMPRESSION: No active disease. Electronically Signed   By: Rosalene Colon M.D.   On: 10/31/2023 10:47   DG Hip Unilat W or Wo Pelvis 2-3 Views Right Result Date: 10/31/2023 CLINICAL DATA:  Right hip deformity after fall today. EXAM: DG HIP (WITH OR WITHOUT PELVIS) 2-3V RIGHT COMPARISON:  July 06, 2022. FINDINGS: Severely angulated and probably comminuted fracture of intertrochanteric region of proximal right femur is noted. IMPRESSION: Severely angulated intertrochanteric fracture of proximal right femur. Electronically Signed   By: Rosalene Colon M.D.   On: 10/31/2023 10:46   VAS US  DUPLEX DIALYSIS ACCESS (AVF, AVG) Result Date: 10/21/2023 DIALYSIS ACCESS Patient Name:  KARON COTTERILL  Date of Exam:   10/21/2023 Medical Rec #: [id number]     Accession #:    [id number] Date of Birth: 02-06-1946    Patient Gender: F Patient Age:   78 years Exam Location:  [address] Procedure:      VAS US  DUPLEX DIALYSIS ACCESS (AVF, AVG) Referring Phys: Genny Kid --------------------------------------------------------------------------------  Reason for Exam: Routine follow up. Access Site: Left Upper Extremity. Access Type: Basilic vein transposition. 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 Performing Technologist: Parke Boll RVS, RCS  Examination Guidelines: A complete evaluation includes B-mode imaging, spectral Doppler, color Doppler, and power Doppler as needed of all accessible portions of each vessel. Unilateral testing is considered an integral part of a complete examination. Limited examinations for reoccurring indications may be performed as noted.  Findings:  +--------------------+----------+-----------------+--------+ AVF                 PSV (cm/s)Flow Vol (mL/min)Comments +--------------------+----------+-----------------+--------+ Native artery inflow   288          1590                +--------------------+----------+-----------------+--------+ AVF Anastomosis        881                              +--------------------+----------+-----------------+--------+  +------------+----------+-------------+----------+--------+ OUTFLOW VEINPSV (cm/s)Diameter (cm)Depth (cm)Describe +------------+----------+-------------+----------+--------+ Prox UA        393        0.77        0.70            +------------+----------+-------------+----------+--------+ Mid UA  277        0.65        0.54            +------------+----------+-------------+----------+--------+ Dist UA        178        0.79        0.56            +------------+----------+-------------+----------+--------+ AC Fossa       842        0.37        0.55            +------------+----------+-------------+----------+--------+   Summary: Patent arteriovenous fistula. *See table(s) above for measurements and observations.  Diagnosing physician: Genny Kid MD Electronically signed by Genny Kid MD on 10/21/2023 at 4:14:11 PM.    --------------------------------------------------------------------------------   Final        The results of significant diagnostics from this hospitalization (including imaging, microbiology, ancillary and laboratory) are listed below for reference.     Microbiology: Recent Results (from the past 240 hours)  Surgical pcr screen     Status: Abnormal   Collection Time: 11/01/23 11:38 AM   Specimen: Nasal Mucosa; Nasal Swab  Result Value Ref Range Status   MRSA, PCR NEGATIVE NEGATIVE Final   Staphylococcus aureus POSITIVE (A) NEGATIVE Final    Comment: (NOTE) The Xpert SA Assay (FDA approved for NASAL specimens in patients  95 years of age and older), is one component of a comprehensive surveillance program. It is not intended to diagnose infection nor to guide or monitor treatment. Performed at [facility], [address] [place], [place] [address]      Labs:  CBC: Recent Labs  Lab 10/31/23 1032 11/01/23 1058 11/01/23 1407 11/02/23 0625 11/03/23 1255 11/04/23 0836 11/05/23 0718  WBC 9.3 7.4  --  8.4 6.5 6.4 6.8  NEUTROABS 7.9*  --   --   --   --   --   --   HGB 9.6* 9.5* 9.9* 7.2* 6.9* 8.0* 8.4*  HCT 29.8* 28.9* 29.0* 22.1* 20.7* 24.0* 25.1*  MCV 92.0 90.6  --  91.7 91.2 88.6 88.7  PLT 168 181  --  155 157 182 199   BMP &GFR Recent Labs  Lab 11/02/23 0625 11/03/23 1255 11/04/23 0836 11/05/23 0718 11/06/23 0738  NA 136 133* 133* 135 134*  K 3.4* 3.8 4.1 4.3 3.9  CL 103 96* 97* 100 99  CO2 20* 27 25 27 30   GLUCOSE 145* 187* 105* 117* 99  BUN 53* 36* 48* 61* 26*  CREATININE 4.57* 3.56* 3.90* 4.18* 2.25*  CALCIUM  7.3* 8.0* 8.4* 8.6* 8.3*  MG 1.8 1.8  --   --   --   PHOS 5.2* 2.5 2.6 2.5 2.2*   Estimated Creatinine Clearance: 18.6 mL/min (A) (by C-G formula based on SCr of 2.25 mg/dL (H)). Liver & Pancreas: Recent Labs  Lab 10/31/23 1032 11/01/23 1730 11/02/23 0625 11/03/23 1255 11/04/23 0836 11/05/23 0718 11/06/23 0738  AST 24  --   --   --   --   --   --   ALT 16  --   --   --   --   --   --   ALKPHOS 64  --   --   --   --   --   --   BILITOT 0.9  --   --   --   --   --   --   PROT 8.0  --   --   --   --   --   --  ALBUMIN 2.9*   < > 2.0* 2.2* 2.0* 2.1* 2.1*   < > = values in this interval not displayed.   No results for input(s): "LIPASE", "AMYLASE" in the last 168 hours. No results for input(s): "AMMONIA" in the last 168 hours. Diabetic: No results for input(s): "HGBA1C" in the last 72 hours. Recent Labs  Lab 11/05/23 1159 11/05/23 2201 11/06/23 0007 11/06/23 0411 11/06/23 0726  GLUCAP 196* 117* 161* 137* 92   Cardiac Enzymes: No results for  input(s): "CKTOTAL", "CKMB", "CKMBINDEX", "TROPONINI" in the last 168 hours. No results for input(s): "PROBNP" in the last 8760 hours. Coagulation Profile: No results for input(s): "INR", "PROTIME" in the last 168 hours. Thyroid  Function Tests: No results for input(s): "TSH", "T4TOTAL", "FREET4", "T3FREE", "THYROIDAB" in the last 72 hours. Lipid Profile: No results for input(s): "CHOL", "HDL", "LDLCALC", "TRIG", "CHOLHDL", "LDLDIRECT" in the last 72 hours. Anemia Panel: No results for input(s): "VITAMINB12", "FOLATE", "FERRITIN", "TIBC", "IRON", "RETICCTPCT" in the last 72 hours. Urine analysis:    Component Value Date/Time   COLORURINE STRAW (A) 08/09/2023 1455   APPEARANCEUR CLEAR 08/09/2023 1455   APPEARANCEUR Cloudy (A) 01/07/2023 1504   LABSPEC 1.008 08/09/2023 1455   PHURINE 6.0 08/09/2023 1455   GLUCOSEU NEGATIVE 08/09/2023 1455   HGBUR NEGATIVE 08/09/2023 1455   BILIRUBINUR NEGATIVE 08/09/2023 1455   BILIRUBINUR Negative 01/07/2023 1504   KETONESUR NEGATIVE 08/09/2023 1455   PROTEINUR 30 (A) 08/09/2023 1455   UROBILINOGEN 1.0 02/10/2012 0817   NITRITE NEGATIVE 08/09/2023 1455   LEUKOCYTESUR NEGATIVE 08/09/2023 1455   Sepsis Labs: Invalid input(s): "PROCALCITONIN", "LACTICIDVEN"   SIGNED:  Theadore Finger, MD  [facility] 11/06/2023, 8:52 AM

## 2023-11-06 NOTE — TOC Transition Note (Signed)
 Transition of Care [facility]) - Discharge Note   Patient Details  Name: Erica Crosby MRN: [id number] Date of Birth: 1945-10-16  Transition of Care [facility]) CM/SW Contact:  Antonia Culbertson A [place], LCSW Phone Number: 11/06/2023, 3:51 PM   Clinical Narrative:     Patient will DC to: [facility]  Anticipated DC date: 11/06/23  Family notified: Seena Dadds  Transport by: Lyna Sandhoff      Per MD patient ready for DC to [facility]. RN, patient, patient's family, and facility notified of DC. Discharge Summary and FL2 sent to facility. RN to call report prior to discharge [phone]). DC packet on chart. Ambulance transport requested for patient.     CSW will sign off for now as social work intervention is no longer needed. Please consult us  again if new needs arise.   Final next level of care: Skilled Nursing Facility Barriers to Discharge: Barriers Resolved   Patient Goals and CMS Choice            Discharge Placement              Patient chooses bed at:  [facility]) Patient to be transferred to facility by: [facility] Name of family member notified: Seena Dadds Patient and family notified of of transfer: 11/06/23  Discharge Plan and Services Additional resources added to the After Visit Summary for                                       Social Drivers of Health (SDOH) Interventions SDOH Screenings   Food Insecurity: No Food Insecurity (11/04/2023)  Housing: Unknown (11/04/2023)  Transportation Needs: No Transportation Needs (11/04/2023)  Utilities: Not At Risk (11/04/2023)  Alcohol Screen: Low Risk  (11/16/2022)  Depression (PHQ2-9): Medium Risk (08/22/2023)  Financial Resource Strain: Low Risk  (11/16/2022)  Physical Activity: Insufficiently Active (11/16/2022)  Social Connections: Moderately Integrated (11/04/2023)  Stress: No Stress Concern Present (11/16/2022)  Tobacco Use: Medium Risk (11/01/2023)     Readmission Risk Interventions     No data  to display

## 2023-11-06 NOTE — TOC Progression Note (Addendum)
 Transition of Care [facility]) - Progression Note    Patient Details  Name: Erica Crosby MRN: [id number] Date of Birth: [birth year]  Transition of Care [facility]) CM/SW Contact  Alexy Bringle A [place], LCSW Phone Number: 11/06/2023, 8:56 AM  Clinical Narrative:      Update 1410: pt's authorization approved for DC.   Auth ID [id number] Reference ID [id number]  Approval Dates: 11/05/2023-11/07/2023   1340 Auth still pending for insurance.    TOC will continue to follow.    Auth still pending for pt at this time.   Auth ID [id number] Reference ID [id number]  CSW will check again at a later time on auth status.    TOC will continue to follow.   Expected Discharge Plan: Skilled Nursing Facility Barriers to Discharge: SNF Pending bed offer, Insurance Authorization  Expected Discharge Plan and Services         Expected Discharge Date: 11/06/23                                     Social Determinants of Health (SDOH) Interventions SDOH Screenings   Food Insecurity: No Food Insecurity (11/04/2023)  Housing: Unknown (11/04/2023)  Transportation Needs: No Transportation Needs (11/04/2023)  Utilities: Not At Risk (11/04/2023)  Alcohol Screen: Low Risk  (11/16/2022)  Depression (PHQ2-9): Medium Risk (08/22/2023)  Financial Resource Strain: Low Risk  (11/16/2022)  Physical Activity: Insufficiently Active (11/16/2022)  Social Connections: Moderately Integrated (11/04/2023)  Stress: No Stress Concern Present (11/16/2022)  Tobacco Use: Medium Risk (11/01/2023)    Readmission Risk Interventions     No data to display

## 2023-11-06 NOTE — Discharge Planning (Signed)
 [facility] Patient Discharge Orders - [facility] CLINIC: [facility]  Patient's name: Erica Crosby Admit/DC Dates: 10/31/2023 - 11/06/2023  DISCHARGE DIAGNOSES: Right hip fracture after fall at home: ORIF on 11/01/23   ESRD on HD  HD ORDER CHANGES: Heparin  change: no change EDW Change: yes New EDW: 46.5 kg Bath Change: no  ANEMIA MANAGEMENT: Aranesp: Given: no ESA dose for discharge: mircera 100 mcg IV q 2 weeks, to start on 11/12/23 IV Iron dose at discharge: n/a Transfusion: Given: 1 unit of PRBC given on 11/03/23  BONE/MINERAL MEDICATIONS: Hectorol/Calcitriol change: per protocol Sensipar/Parsabiv change: per protocol  ACCESS INTERVENTION/CHANGE: no Details: [facility]   RECENT LABS: Recent Labs  Lab 11/05/23 0718 11/06/23 0738  HGB 8.4*  --   NA 135 134*  K 4.3 3.9  CALCIUM  8.6* 8.3*  PHOS 2.5 2.2*  ALBUMIN 2.1* 2.1*    IV ANTIBIOTICS: no Details:  OTHER ANTICOAGULATION: On Coumadin?: no Last INR: Managed By:  OTHER/APPTS/LAB ORDERS: Please draw updated labs at HD  D/C Meds to be reconciled by nurse after every discharge.  Completed By: Hersey Lorenzo, NP-C   Reviewed by: MD:______ RN_______

## 2023-11-06 NOTE — Plan of Care (Signed)
  Problem: Education: Goal: Knowledge of General Education information will improve Description: Including pain rating scale, medication(s)/side effects and non-pharmacologic comfort measures Outcome: Progressing   Problem: Health Behavior/Discharge Planning: Goal: Ability to manage health-related needs will improve Outcome: Progressing   Problem: Clinical Measurements: Goal: Ability to maintain clinical measurements within normal limits will improve Outcome: Progressing Goal: Will remain free from infection Outcome: Progressing Goal: Diagnostic test results will improve Outcome: Progressing Goal: Respiratory complications will improve Outcome: Progressing Goal: Cardiovascular complication will be avoided Outcome: Progressing   Problem: Activity: Goal: Risk for activity intolerance will decrease Outcome: Progressing   Problem: Nutrition: Goal: Adequate nutrition will be maintained Outcome: Progressing   Problem: Coping: Goal: Level of anxiety will decrease Outcome: Progressing   Problem: Elimination: Goal: Will not experience complications related to bowel motility Outcome: Progressing Goal: Will not experience complications related to urinary retention Outcome: Progressing   Problem: Pain Managment: Goal: General experience of comfort will improve and/or be controlled Outcome: Progressing   Problem: Safety: Goal: Ability to remain free from injury will improve Outcome: Progressing   Problem: Skin Integrity: Goal: Risk for impaired skin integrity will decrease Outcome: Progressing   Problem: Education: Goal: Ability to describe self-care measures that may prevent or decrease complications (Diabetes Survival Skills Education) will improve Outcome: Progressing Goal: Individualized Educational Video(s) Outcome: Progressing   Problem: Coping: Goal: Ability to adjust to condition or change in health will improve Outcome: Progressing   Problem: Fluid  Volume: Goal: Ability to maintain a balanced intake and output will improve Outcome: Progressing   Problem: Health Behavior/Discharge Planning: Goal: Ability to identify and utilize available resources and services will improve Outcome: Progressing Goal: Ability to manage health-related needs will improve Outcome: Progressing   Problem: Metabolic: Goal: Ability to maintain appropriate glucose levels will improve Outcome: Progressing   Problem: Nutritional: Goal: Maintenance of adequate nutrition will improve Outcome: Progressing Goal: Progress toward achieving an optimal weight will improve Outcome: Progressing   Problem: Skin Integrity: Goal: Risk for impaired skin integrity will decrease Outcome: Progressing   Problem: Tissue Perfusion: Goal: Adequacy of tissue perfusion will improve Outcome: Progressing   Problem: Education: Goal: Verbalization of understanding the information provided (i.e., activity precautions, restrictions, etc) will improve Outcome: Progressing Goal: Individualized Educational Video(s) Outcome: Progressing   Problem: Activity: Goal: Ability to ambulate and perform ADLs will improve Outcome: Progressing   Problem: Clinical Measurements: Goal: Postoperative complications will be avoided or minimized Outcome: Progressing   Problem: Self-Concept: Goal: Ability to maintain and perform role responsibilities to the fullest extent possible will improve Outcome: Progressing   Problem: Pain Management: Goal: Pain level will decrease Outcome: Progressing

## 2023-11-06 NOTE — Progress Notes (Signed)
 Advised by CSW that insurance auth for snf has been received. Pt will d/c to snf today. Contacted [facility] to be advised of pt's d/c to snf today and that pt should resume care tomorrow. SNF name provided to clinic. Renal NP made aware of pt's d/c today so orders can be sent to clinic for tomorrow. HD information placed on AVS.   Lauraine Polite Renal Navigator [phone]

## 2023-11-07 DIAGNOSIS — E039 Hypothyroidism, unspecified: Secondary | ICD-10-CM | POA: Diagnosis not present

## 2023-11-07 DIAGNOSIS — E7849 Other hyperlipidemia: Secondary | ICD-10-CM | POA: Diagnosis not present

## 2023-11-07 DIAGNOSIS — N2581 Secondary hyperparathyroidism of renal origin: Secondary | ICD-10-CM | POA: Diagnosis not present

## 2023-11-07 DIAGNOSIS — S72001A Fracture of unspecified part of neck of right femur, initial encounter for closed fracture: Secondary | ICD-10-CM | POA: Diagnosis not present

## 2023-11-07 DIAGNOSIS — E1122 Type 2 diabetes mellitus with diabetic chronic kidney disease: Secondary | ICD-10-CM | POA: Diagnosis not present

## 2023-11-07 DIAGNOSIS — N186 End stage renal disease: Secondary | ICD-10-CM | POA: Diagnosis not present

## 2023-11-07 DIAGNOSIS — I10 Essential (primary) hypertension: Secondary | ICD-10-CM | POA: Diagnosis not present

## 2023-11-07 DIAGNOSIS — R531 Weakness: Secondary | ICD-10-CM | POA: Diagnosis not present

## 2023-11-07 DIAGNOSIS — Z992 Dependence on renal dialysis: Secondary | ICD-10-CM | POA: Diagnosis not present

## 2023-11-07 DIAGNOSIS — D631 Anemia in chronic kidney disease: Secondary | ICD-10-CM | POA: Diagnosis not present

## 2023-11-07 DIAGNOSIS — D689 Coagulation defect, unspecified: Secondary | ICD-10-CM | POA: Diagnosis not present

## 2023-11-07 DIAGNOSIS — S7291XA Unspecified fracture of right femur, initial encounter for closed fracture: Secondary | ICD-10-CM | POA: Diagnosis not present

## 2023-11-07 DIAGNOSIS — Z23 Encounter for immunization: Secondary | ICD-10-CM | POA: Diagnosis not present

## 2023-11-07 DIAGNOSIS — R52 Pain, unspecified: Secondary | ICD-10-CM | POA: Diagnosis not present

## 2023-11-07 DIAGNOSIS — D509 Iron deficiency anemia, unspecified: Secondary | ICD-10-CM | POA: Diagnosis not present

## 2023-11-08 NOTE — Transitions of Care (Post Inpatient/ED Visit) (Signed)
 11/08/2023  Patient ID: Erica Crosby, female   DOB: 03/12/1946, 78 y.o.   MRN: [id number]  Chart Review for transition of care. Patient currently in SNF.   Xadrian Craighead J. Ardith Lewman RN, MSN [facility], [facility] RN Care Manager Direct Dial: [phone]  Fax: [phone] Website: [email or web address]

## 2023-11-09 DIAGNOSIS — D509 Iron deficiency anemia, unspecified: Secondary | ICD-10-CM | POA: Diagnosis not present

## 2023-11-09 DIAGNOSIS — D631 Anemia in chronic kidney disease: Secondary | ICD-10-CM | POA: Diagnosis not present

## 2023-11-09 DIAGNOSIS — N186 End stage renal disease: Secondary | ICD-10-CM | POA: Diagnosis not present

## 2023-11-09 DIAGNOSIS — Z992 Dependence on renal dialysis: Secondary | ICD-10-CM | POA: Diagnosis not present

## 2023-11-09 DIAGNOSIS — D689 Coagulation defect, unspecified: Secondary | ICD-10-CM | POA: Diagnosis not present

## 2023-11-09 DIAGNOSIS — Z23 Encounter for immunization: Secondary | ICD-10-CM | POA: Diagnosis not present

## 2023-11-09 DIAGNOSIS — E039 Hypothyroidism, unspecified: Secondary | ICD-10-CM | POA: Diagnosis not present

## 2023-11-09 DIAGNOSIS — R52 Pain, unspecified: Secondary | ICD-10-CM | POA: Diagnosis not present

## 2023-11-09 DIAGNOSIS — N2581 Secondary hyperparathyroidism of renal origin: Secondary | ICD-10-CM | POA: Diagnosis not present

## 2023-11-09 DIAGNOSIS — E1122 Type 2 diabetes mellitus with diabetic chronic kidney disease: Secondary | ICD-10-CM | POA: Diagnosis not present

## 2023-11-09 LAB — HEPATITIS B SURFACE ANTIBODY, QUANTITATIVE: Hep B S AB Quant (Post): <3.5 m[IU]/mL — ABNORMAL LOW

## 2023-11-12 ENCOUNTER — Other Ambulatory Visit: Payer: Self-pay | Admitting: Family Medicine

## 2023-11-12 ENCOUNTER — Telehealth: Payer: Self-pay

## 2023-11-12 DIAGNOSIS — N2581 Secondary hyperparathyroidism of renal origin: Secondary | ICD-10-CM | POA: Diagnosis not present

## 2023-11-12 DIAGNOSIS — R52 Pain, unspecified: Secondary | ICD-10-CM | POA: Diagnosis not present

## 2023-11-12 DIAGNOSIS — D509 Iron deficiency anemia, unspecified: Secondary | ICD-10-CM | POA: Diagnosis not present

## 2023-11-12 DIAGNOSIS — Z23 Encounter for immunization: Secondary | ICD-10-CM | POA: Diagnosis not present

## 2023-11-12 DIAGNOSIS — D689 Coagulation defect, unspecified: Secondary | ICD-10-CM | POA: Diagnosis not present

## 2023-11-12 DIAGNOSIS — D631 Anemia in chronic kidney disease: Secondary | ICD-10-CM | POA: Diagnosis not present

## 2023-11-12 DIAGNOSIS — Z992 Dependence on renal dialysis: Secondary | ICD-10-CM | POA: Diagnosis not present

## 2023-11-12 DIAGNOSIS — N186 End stage renal disease: Secondary | ICD-10-CM | POA: Diagnosis not present

## 2023-11-12 DIAGNOSIS — E1122 Type 2 diabetes mellitus with diabetic chronic kidney disease: Secondary | ICD-10-CM | POA: Diagnosis not present

## 2023-11-12 DIAGNOSIS — E039 Hypothyroidism, unspecified: Secondary | ICD-10-CM | POA: Diagnosis not present

## 2023-11-12 NOTE — Progress Notes (Signed)
 Anesthesia Chart Review: Same day workup  78 yo female with pertinent hx including former smoking, HTN, CAD, chronic LBBB, mod-severe mitral regurgitation, mild-mod mitral stenosis, ESRD on HD TTS, GERD on PPI, mild dementia, insulin -dependent diabetes (A1c 5.7 on 11/01/23), RA, anemia, arthritis.   Patient follows with cardiology for nonobstructive CAD by cath in 2012 and severe mitral regurgitation.  Last seen by Dr. Lavonne Prairie on 05/08/2023.  She had no cardiac complaints at that time.  Last echo in January 2024 showed normal EF with moderate to severe mitral regurgitation and mild to moderate mitral stenosis.  Per Dr. Lavonne Prairie: "She would be high risk for open surgical repair.  She might be a candidate for percutaneous repair but she does not want to consider this at this point.  She is not having any acute cardiovascular symptoms and for now no change in therapy is indicated."   Recent admission 5/15-5/21/25 for fall at home resulting in closed right proximal femur fracture. She underwent IM nailing by Dr. Curtiss Dowdy. Hgb dropped 2g postop, stable after 1 unit PRBC, Last Hgb 8.4 on 11/05/23. She also reported some chest pain that resolved spontaneously. EKG with chronic LBBB, troponin negative x 3.   Hgb 8.8 on 11/07/23 at dialysis.   Pt will need DOS labs and eval.  EKG 11/05/23: Normal sinus rhythm. Left bundle branch block  TTE 07/09/22: 1. Left ventricular ejection fraction, by estimation, is 60 to 65%. The  left ventricle has normal function. The left ventricle has no regional  wall motion abnormalities. There is moderate left ventricular hypertrophy.  Left ventricular diastolic  parameters were normal. Elevated left atrial pressure.   2. Right ventricular systolic function is normal. The right ventricular  size is normal. There is normal pulmonary artery systolic pressure.   3. Left atrial size was severely dilated.   4. MR vena contracta is 0.6 cm. MV to AV VTI ratio is 1.8 suggesting   severe MR. . The mitral valve is abnormal. Moderate to severe mitral valve  regurgitation. Mild to moderate mitral stenosis. Moderate mitral annular  calcification.   5. The aortic valve is tricuspid. Aortic valve regurgitation is not  visualized. No aortic stenosis is present.   6. The inferior vena cava is normal in size with greater than 50%  respiratory variability, suggesting right atrial pressure of 3 mmHg.    Edilia Gordon [facility] Short Stay Center/Anesthesiology Phone [phone] 11/12/2023 8:54 AM

## 2023-11-12 NOTE — Anesthesia Preprocedure Evaluation (Signed)
 Anesthesia Evaluation    Airway        Dental   Pulmonary former smoker          Cardiovascular hypertension,      Neuro/Psych    GI/Hepatic   Endo/Other  diabetes    Renal/GU      Musculoskeletal   Abdominal   Peds  Hematology   Anesthesia Other Findings   Reproductive/Obstetrics                              Anesthesia Physical Anesthesia Plan  ASA:   Anesthesia Plan:    Post-op Pain Management:    Induction:   PONV Risk Score and Plan:   Airway Management Planned:   Additional Equipment:   Intra-op Plan:   Post-operative Plan:   Informed Consent:   Plan Discussed with:   Anesthesia Plan Comments: (PAT note by Rudy Costain, PA-C: 78 yo female with pertinent hx including former smoking, HTN, CAD, chronic LBBB, mod-severe mitral regurgitation, mild-mod mitral stenosis, ESRD on HD TTS, GERD on PPI, mild dementia, insulin -dependent diabetes (A1c 5.7 on 11/01/23), RA, anemia, arthritis.   Patient follows with cardiology for nonobstructive CAD by cath in 2012 and severe mitral regurgitation.  Last seen by Dr. Lavonne Prairie on 05/08/2023.  She had no cardiac complaints at that time.  Last echo in January 2024 showed normal EF with moderate to severe mitral regurgitation and mild to moderate mitral stenosis.  Per Dr. Lavonne Prairie: "She would be high risk for open surgical repair.  She might be a candidate for percutaneous repair but she does not want to consider this at this point.  She is not having any acute cardiovascular symptoms and for now no change in therapy is indicated."   Recent admission 5/15-5/21/25 for fall at home resulting in closed right proximal femur fracture. She underwent IM nailing by Dr. Curtiss Dowdy. Hgb dropped 2g postop, stable after 1 unit PRBC, Last Hgb 8.4 on 11/05/23. She also reported some chest pain that resolved spontaneously. EKG with chronic LBBB, troponin negative x 3.    Hgb 8.8 on 11/07/23 at dialysis.   Pt will need DOS labs and eval.  EKG 11/05/23: Normal sinus rhythm. Left bundle branch block  TTE 07/09/22: 1. Left ventricular ejection fraction, by estimation, is 60 to 65%. The  left ventricle has normal function. The left ventricle has no regional  wall motion abnormalities. There is moderate left ventricular hypertrophy.  Left ventricular diastolic  parameters were normal. Elevated left atrial pressure.   2. Right ventricular systolic function is normal. The right ventricular  size is normal. There is normal pulmonary artery systolic pressure.   3. Left atrial size was severely dilated.   4. MR vena contracta is 0.6 cm. MV to AV VTI ratio is 1.8 suggesting  severe MR. . The mitral valve is abnormal. Moderate to severe mitral valve  regurgitation. Mild to moderate mitral stenosis. Moderate mitral annular  calcification.   5. The aortic valve is tricuspid. Aortic valve regurgitation is not  visualized. No aortic stenosis is present.   6. The inferior vena cava is normal in size with greater than 50%  respiratory variability, suggesting right atrial pressure of 3 mmHg.  )         Anesthesia Quick Evaluation

## 2023-11-12 NOTE — Progress Notes (Addendum)
 SDW INSTRUCTIONS given:   Your procedure is scheduled on Nov 13, 2023.             Report to [facility] Main Entrance "A" at 7:00 A.M., and check in at the Admitting office.             Call this number if you have problems the morning of surgery:             [phone]               Remember:             Do not eat  or drink after midnight the night before your surgery                              Take these medicines the morning of surgery with A SIP OF WATER  atorvastatin  (LIPITOR)  carvedilol  (COREG ) pantoprazole  (PROTONIX )   aspirin  EC 81      IF NEEDED acetaminophen  (TYLENOL )  HYDROcodone -acetaminophen  (NORCO/VICODIN)          As of today, STOP taking any Aspirin  (unless otherwise instructed by your surgeon) Aleve, Naproxen, Ibuprofen, Motrin, Advil, Goody's, BC's, all herbal medications, fish oil, and all vitamins. WHAT DO I DO ABOUT MY DIABETES MEDICATION?     Do not take oral diabetes medicines (pills) the morning of surgery.   LANTUS  half of dose   The day of surgery, do not take other diabetes injectables, including Byetta (exenatide), Bydureon (exenatide ER), Victoza (liraglutide), or Trulicity (dulaglutide).   If your CBG is greater than 220 mg/dL, you may take  of your sliding scale (correction) dose of insulin .     HOW TO MANAGE YOUR DIABETES BEFORE AND AFTER SURGERY   Why is it important to control my blood sugar before and after surgery? Improving blood sugar levels before and after surgery helps healing and can limit problems. A way of improving blood sugar control is eating a healthy diet by:  Eating less sugar and carbohydrates  Increasing activity/exercise  Talking with your doctor about reaching your blood sugar goals High blood sugars (greater than 180 mg/dL) can raise your risk of infections and slow your recovery, so you will need to focus on controlling your diabetes during the weeks before surgery. Make sure that the doctor who takes  care of your diabetes knows about your planned surgery including the date and location.   How do I manage my blood sugar before surgery? Check your blood sugar at least 4 times a day, starting 2 days before surgery, to make sure that the level is not too high or low.   Check your blood sugar the morning of your surgery when you wake up and every 2 hours until you get to the Short Stay unit.   If your blood sugar is less than 70 mg/dL, you will need to treat for low blood sugar: Do not take insulin . Treat a low blood sugar (less than 70 mg/dL) with  cup of clear juice (cranberry or apple), 4 glucose tablets, OR glucose gel. Recheck blood sugar in 15 minutes after treatment (to make sure it is greater than 70 mg/dL). If your blood sugar is not greater than 70 mg/dL on recheck, call [phone] for further instructions. Report your blood sugar to the short stay nurse when you get to Short Stay.   If you are admitted to the hospital after surgery: Your blood sugar  will be checked by the staff and you will probably be given insulin  after surgery (instead of oral diabetes medicines) to make sure you have good blood sugar levels. The goal for blood sugar control after surgery is 80-180 mg/dL.                      Do not wear jewelry, make up, or nail polish            Do not wear lotions, powders, perfumes/colognes, or deodorant.            Do not shave 48 hours prior to surgery.  Men may shave face and neck.            Do not bring valuables to the hospital.            [facility] is not responsible for any belongings or valuables.   Do NOT Smoke (Tobacco/Vaping) 24 hours prior to your procedure If you use a CPAP at night, you may bring all equipment for your overnight stay.   Contacts, glasses, dentures or bridgework may not be worn into surgery.      For patients admitted to the hospital, discharge time will be determined by your treatment team.   Patients discharged the day of surgery  will not be allowed to drive home, and someone needs to stay with them for 24 hours.       Special instructions:   [facility]- Preparing For Surgery   Before surgery, you can play an important role. Because skin is not sterile, your skin needs to be as free of germs as possible. You can reduce the number of germs on your skin by washing with CHG (chlorahexidine gluconate) Soap before surgery.  CHG is an antiseptic cleaner which kills germs and bonds with the skin to continue killing germs even after washing.     Oral Hygiene is also important to reduce your risk of infection.  Remember - BRUSH YOUR TEETH THE MORNING OF SURGERY WITH YOUR REGULAR TOOTHPASTE   Please do not use if you have an allergy to CHG or antibacterial soaps. If your skin becomes reddened/irritated stop using the CHG.  Do not shave (including legs and underarms) for at least 48 hours prior to first CHG shower. It is OK to shave your face.   Please follow these instructions carefully.              Shower the NIGHT BEFORE SURGERY and the MORNING OF SURGERY with DIAL Soap.    Pat yourself dry with a CLEAN TOWEL.   Wear CLEAN PAJAMAS to bed the night before surgery   Place CLEAN SHEETS on your bed the night of your first shower and DO NOT SLEEP WITH PETS.     Day of Surgery: Please shower morning of surgery  Wear Clean/Comfortable clothing the morning of surgery Do not apply any deodorants/lotions.   Remember to brush your teeth WITH YOUR REGULAR TOOTHPASTE.

## 2023-11-12 NOTE — Telephone Encounter (Signed)
 Per Erica Crosby PAT dept, patient's daughter, Nadine Aus, has requested to cancel surgery on 5/28.  Will call to reschedule.

## 2023-11-12 NOTE — Progress Notes (Signed)
 Spoke with patient's daughter states she is going to cancel upcoming procedure for tomorrow.  Surgeon office aware

## 2023-11-12 NOTE — Progress Notes (Signed)
 PCP - Eliodoro Guerin, DO  Cardiologist - Dr. Lavonne Prairie   PPM/ICD - denies Device Orders - n/a Rep Notified - n/a  Chest x-ray - 11-05-23 EKG - 11/05/23  Stress Test - 02-06-16 ECHO - 07-09-22 Cardiac Cath - 02-19-2003  CPAP -   GLP-1 -  Fasting Blood Sugar -  Checks Blood Sugar /day  Blood Thinner Instructions:  Aspirin  Instructions: continue  ERAS Protcol - NPO  COVID TEST- n/a  Anesthesia review: yes  Patient verbally denies any shortness of breath, fever, cough and chest pain during phone call   -------------  SDW INSTRUCTIONS given:  Your procedure is scheduled on Nov 13, 2023.  Report to [facility] Main Entrance "A" at 7:00 A.M., and check in at the Admitting office.  Call this number if you have problems the morning of surgery:  [phone]   Remember:  Do not eat  or drink after midnight the night before your surgery      Take these medicines the morning of surgery with A SIP OF WATER  atorvastatin  (LIPITOR)  carvedilol  (COREG ) pantoprazole  (PROTONIX )     IF NEEDED acetaminophen  (TYLENOL )  HYDROcodone -acetaminophen  (NORCO/VICODIN)      As of today, STOP taking any Aspirin  (unless otherwise instructed by your surgeon) Aleve, Naproxen, Ibuprofen, Motrin, Advil, Goody's, BC's, all herbal medications, fish oil, and all vitamins. WHAT DO I DO ABOUT MY DIABETES MEDICATION?   Do not take oral diabetes medicines (pills) the morning of surgery.  LANTUS  half of dose  The day of surgery, do not take other diabetes injectables, including Byetta (exenatide), Bydureon (exenatide ER), Victoza (liraglutide), or Trulicity (dulaglutide).  If your CBG is greater than 220 mg/dL, you may take  of your sliding scale (correction) dose of insulin .   HOW TO MANAGE YOUR DIABETES BEFORE AND AFTER SURGERY  Why is it important to control my blood sugar before and after surgery? Improving blood sugar levels before and after surgery helps healing and can limit  problems. A way of improving blood sugar control is eating a healthy diet by:  Eating less sugar and carbohydrates  Increasing activity/exercise  Talking with your doctor about reaching your blood sugar goals High blood sugars (greater than 180 mg/dL) can raise your risk of infections and slow your recovery, so you will need to focus on controlling your diabetes during the weeks before surgery. Make sure that the doctor who takes care of your diabetes knows about your planned surgery including the date and location.  How do I manage my blood sugar before surgery? Check your blood sugar at least 4 times a day, starting 2 days before surgery, to make sure that the level is not too high or low.  Check your blood sugar the morning of your surgery when you wake up and every 2 hours until you get to the Short Stay unit.  If your blood sugar is less than 70 mg/dL, you will need to treat for low blood sugar: Do not take insulin . Treat a low blood sugar (less than 70 mg/dL) with  cup of clear juice (cranberry or apple), 4 glucose tablets, OR glucose gel. Recheck blood sugar in 15 minutes after treatment (to make sure it is greater than 70 mg/dL). If your blood sugar is not greater than 70 mg/dL on recheck, call [phone] for further instructions. Report your blood sugar to the short stay nurse when you get to Short Stay.  If you are admitted to the hospital after surgery: Your blood sugar will  be checked by the staff and you will probably be given insulin  after surgery (instead of oral diabetes medicines) to make sure you have good blood sugar levels. The goal for blood sugar control after surgery is 80-180 mg/dL.                      Do not wear jewelry, make up, or nail polish            Do not wear lotions, powders, perfumes/colognes, or deodorant.            Do not shave 48 hours prior to surgery.  Men may shave face and neck.            Do not bring valuables to the hospital.             [facility] is not responsible for any belongings or valuables.  Do NOT Smoke (Tobacco/Vaping) 24 hours prior to your procedure If you use a CPAP at night, you may bring all equipment for your overnight stay.   Contacts, glasses, dentures or bridgework may not be worn into surgery.      For patients admitted to the hospital, discharge time will be determined by your treatment team.   Patients discharged the day of surgery will not be allowed to drive home, and someone needs to stay with them for 24 hours.    Special instructions:   [facility]- Preparing For Surgery  Before surgery, you can play an important role. Because skin is not sterile, your skin needs to be as free of germs as possible. You can reduce the number of germs on your skin by washing with CHG (chlorahexidine gluconate) Soap before surgery.  CHG is an antiseptic cleaner which kills germs and bonds with the skin to continue killing germs even after washing.    Oral Hygiene is also important to reduce your risk of infection.  Remember - BRUSH YOUR TEETH THE MORNING OF SURGERY WITH YOUR REGULAR TOOTHPASTE  Please do not use if you have an allergy to CHG or antibacterial soaps. If your skin becomes reddened/irritated stop using the CHG.  Do not shave (including legs and underarms) for at least 48 hours prior to first CHG shower. It is OK to shave your face.  Please follow these instructions carefully.   Shower the NIGHT BEFORE SURGERY and the MORNING OF SURGERY with DIAL Soap.   Pat yourself dry with a CLEAN TOWEL.  Wear CLEAN PAJAMAS to bed the night before surgery  Place CLEAN SHEETS on your bed the night of your first shower and DO NOT SLEEP WITH PETS.   Day of Surgery: Please shower morning of surgery  Wear Clean/Comfortable clothing the morning of surgery Do not apply any deodorants/lotions.   Remember to brush your teeth WITH YOUR REGULAR TOOTHPASTE.   Questions were answered. Patient verbalized  understanding of instructions.

## 2023-11-13 ENCOUNTER — Encounter (HOSPITAL_COMMUNITY): Payer: Self-pay | Admitting: Physician Assistant

## 2023-11-13 ENCOUNTER — Ambulatory Visit (HOSPITAL_COMMUNITY): Admission: RE | Admit: 2023-11-13 | Source: Home / Self Care | Admitting: Surgery

## 2023-11-13 SURGERY — TRANSPOSITION, VEIN, BASILIC
Anesthesia: Choice | Laterality: Left

## 2023-11-14 DIAGNOSIS — D509 Iron deficiency anemia, unspecified: Secondary | ICD-10-CM | POA: Diagnosis not present

## 2023-11-14 DIAGNOSIS — D631 Anemia in chronic kidney disease: Secondary | ICD-10-CM | POA: Diagnosis not present

## 2023-11-14 DIAGNOSIS — E1122 Type 2 diabetes mellitus with diabetic chronic kidney disease: Secondary | ICD-10-CM | POA: Diagnosis not present

## 2023-11-14 DIAGNOSIS — Z992 Dependence on renal dialysis: Secondary | ICD-10-CM | POA: Diagnosis not present

## 2023-11-14 DIAGNOSIS — Z23 Encounter for immunization: Secondary | ICD-10-CM | POA: Diagnosis not present

## 2023-11-14 DIAGNOSIS — D689 Coagulation defect, unspecified: Secondary | ICD-10-CM | POA: Diagnosis not present

## 2023-11-14 DIAGNOSIS — N186 End stage renal disease: Secondary | ICD-10-CM | POA: Diagnosis not present

## 2023-11-14 DIAGNOSIS — R52 Pain, unspecified: Secondary | ICD-10-CM | POA: Diagnosis not present

## 2023-11-14 DIAGNOSIS — N2581 Secondary hyperparathyroidism of renal origin: Secondary | ICD-10-CM | POA: Diagnosis not present

## 2023-11-14 DIAGNOSIS — E039 Hypothyroidism, unspecified: Secondary | ICD-10-CM | POA: Diagnosis not present

## 2023-11-16 DIAGNOSIS — D631 Anemia in chronic kidney disease: Secondary | ICD-10-CM | POA: Diagnosis not present

## 2023-11-16 DIAGNOSIS — N186 End stage renal disease: Secondary | ICD-10-CM | POA: Diagnosis not present

## 2023-11-16 DIAGNOSIS — D509 Iron deficiency anemia, unspecified: Secondary | ICD-10-CM | POA: Diagnosis not present

## 2023-11-16 DIAGNOSIS — Z23 Encounter for immunization: Secondary | ICD-10-CM | POA: Diagnosis not present

## 2023-11-16 DIAGNOSIS — D689 Coagulation defect, unspecified: Secondary | ICD-10-CM | POA: Diagnosis not present

## 2023-11-16 DIAGNOSIS — N2581 Secondary hyperparathyroidism of renal origin: Secondary | ICD-10-CM | POA: Diagnosis not present

## 2023-11-16 DIAGNOSIS — E039 Hypothyroidism, unspecified: Secondary | ICD-10-CM | POA: Diagnosis not present

## 2023-11-16 DIAGNOSIS — Z992 Dependence on renal dialysis: Secondary | ICD-10-CM | POA: Diagnosis not present

## 2023-11-16 DIAGNOSIS — E1122 Type 2 diabetes mellitus with diabetic chronic kidney disease: Secondary | ICD-10-CM | POA: Diagnosis not present

## 2023-11-16 DIAGNOSIS — R52 Pain, unspecified: Secondary | ICD-10-CM | POA: Diagnosis not present

## 2023-11-18 NOTE — Progress Notes (Signed)
 This encounter was created in error - please disregard. reschedule from 11/17/24 per pt's daughter/currently pt in in Nursing home due to a fall. pls call pt's daughter to remind appt since she is going to assist pt w/awv. Alia T/cma

## 2023-11-19 DIAGNOSIS — N186 End stage renal disease: Secondary | ICD-10-CM | POA: Diagnosis not present

## 2023-11-19 DIAGNOSIS — D631 Anemia in chronic kidney disease: Secondary | ICD-10-CM | POA: Diagnosis not present

## 2023-11-19 DIAGNOSIS — N2581 Secondary hyperparathyroidism of renal origin: Secondary | ICD-10-CM | POA: Diagnosis not present

## 2023-11-19 DIAGNOSIS — D509 Iron deficiency anemia, unspecified: Secondary | ICD-10-CM | POA: Diagnosis not present

## 2023-11-19 DIAGNOSIS — D689 Coagulation defect, unspecified: Secondary | ICD-10-CM | POA: Diagnosis not present

## 2023-11-19 DIAGNOSIS — Z992 Dependence on renal dialysis: Secondary | ICD-10-CM | POA: Diagnosis not present

## 2023-11-21 DIAGNOSIS — D509 Iron deficiency anemia, unspecified: Secondary | ICD-10-CM | POA: Diagnosis not present

## 2023-11-21 DIAGNOSIS — N2581 Secondary hyperparathyroidism of renal origin: Secondary | ICD-10-CM | POA: Diagnosis not present

## 2023-11-21 DIAGNOSIS — D689 Coagulation defect, unspecified: Secondary | ICD-10-CM | POA: Diagnosis not present

## 2023-11-21 DIAGNOSIS — N186 End stage renal disease: Secondary | ICD-10-CM | POA: Diagnosis not present

## 2023-11-21 DIAGNOSIS — D631 Anemia in chronic kidney disease: Secondary | ICD-10-CM | POA: Diagnosis not present

## 2023-11-21 DIAGNOSIS — Z992 Dependence on renal dialysis: Secondary | ICD-10-CM | POA: Diagnosis not present

## 2023-11-23 DIAGNOSIS — D509 Iron deficiency anemia, unspecified: Secondary | ICD-10-CM | POA: Diagnosis not present

## 2023-11-23 DIAGNOSIS — N2581 Secondary hyperparathyroidism of renal origin: Secondary | ICD-10-CM | POA: Diagnosis not present

## 2023-11-23 DIAGNOSIS — D631 Anemia in chronic kidney disease: Secondary | ICD-10-CM | POA: Diagnosis not present

## 2023-11-23 DIAGNOSIS — Z992 Dependence on renal dialysis: Secondary | ICD-10-CM | POA: Diagnosis not present

## 2023-11-23 DIAGNOSIS — D689 Coagulation defect, unspecified: Secondary | ICD-10-CM | POA: Diagnosis not present

## 2023-11-23 DIAGNOSIS — N186 End stage renal disease: Secondary | ICD-10-CM | POA: Diagnosis not present

## 2023-11-25 DIAGNOSIS — S72141D Displaced intertrochanteric fracture of right femur, subsequent encounter for closed fracture with routine healing: Secondary | ICD-10-CM | POA: Diagnosis not present

## 2023-11-26 DIAGNOSIS — Z992 Dependence on renal dialysis: Secondary | ICD-10-CM | POA: Diagnosis not present

## 2023-11-26 DIAGNOSIS — N186 End stage renal disease: Secondary | ICD-10-CM | POA: Diagnosis not present

## 2023-11-26 DIAGNOSIS — D631 Anemia in chronic kidney disease: Secondary | ICD-10-CM | POA: Diagnosis not present

## 2023-11-26 DIAGNOSIS — D689 Coagulation defect, unspecified: Secondary | ICD-10-CM | POA: Diagnosis not present

## 2023-11-26 DIAGNOSIS — N2581 Secondary hyperparathyroidism of renal origin: Secondary | ICD-10-CM | POA: Diagnosis not present

## 2023-11-26 DIAGNOSIS — D509 Iron deficiency anemia, unspecified: Secondary | ICD-10-CM | POA: Diagnosis not present

## 2023-11-28 DIAGNOSIS — N186 End stage renal disease: Secondary | ICD-10-CM | POA: Diagnosis not present

## 2023-11-28 DIAGNOSIS — D689 Coagulation defect, unspecified: Secondary | ICD-10-CM | POA: Diagnosis not present

## 2023-11-28 DIAGNOSIS — S72001D Fracture of unspecified part of neck of right femur, subsequent encounter for closed fracture with routine healing: Secondary | ICD-10-CM | POA: Diagnosis not present

## 2023-11-28 DIAGNOSIS — N2581 Secondary hyperparathyroidism of renal origin: Secondary | ICD-10-CM | POA: Diagnosis not present

## 2023-11-28 DIAGNOSIS — Z992 Dependence on renal dialysis: Secondary | ICD-10-CM | POA: Diagnosis not present

## 2023-11-28 DIAGNOSIS — M6281 Muscle weakness (generalized): Secondary | ICD-10-CM | POA: Diagnosis not present

## 2023-11-28 DIAGNOSIS — M810 Age-related osteoporosis without current pathological fracture: Secondary | ICD-10-CM | POA: Diagnosis not present

## 2023-11-28 DIAGNOSIS — D509 Iron deficiency anemia, unspecified: Secondary | ICD-10-CM | POA: Diagnosis not present

## 2023-11-28 DIAGNOSIS — D631 Anemia in chronic kidney disease: Secondary | ICD-10-CM | POA: Diagnosis not present

## 2023-11-29 DIAGNOSIS — I10 Essential (primary) hypertension: Secondary | ICD-10-CM | POA: Diagnosis not present

## 2023-11-29 DIAGNOSIS — E7849 Other hyperlipidemia: Secondary | ICD-10-CM | POA: Diagnosis not present

## 2023-11-29 DIAGNOSIS — N186 End stage renal disease: Secondary | ICD-10-CM | POA: Diagnosis not present

## 2023-11-29 DIAGNOSIS — E1122 Type 2 diabetes mellitus with diabetic chronic kidney disease: Secondary | ICD-10-CM | POA: Diagnosis not present

## 2023-11-30 DIAGNOSIS — Z7982 Long term (current) use of aspirin: Secondary | ICD-10-CM | POA: Diagnosis not present

## 2023-11-30 DIAGNOSIS — Z794 Long term (current) use of insulin: Secondary | ICD-10-CM | POA: Diagnosis not present

## 2023-11-30 DIAGNOSIS — N2581 Secondary hyperparathyroidism of renal origin: Secondary | ICD-10-CM | POA: Diagnosis not present

## 2023-11-30 DIAGNOSIS — S72001D Fracture of unspecified part of neck of right femur, subsequent encounter for closed fracture with routine healing: Secondary | ICD-10-CM | POA: Diagnosis not present

## 2023-11-30 DIAGNOSIS — K5909 Other constipation: Secondary | ICD-10-CM | POA: Diagnosis not present

## 2023-11-30 DIAGNOSIS — E1122 Type 2 diabetes mellitus with diabetic chronic kidney disease: Secondary | ICD-10-CM | POA: Diagnosis not present

## 2023-11-30 DIAGNOSIS — Z9181 History of falling: Secondary | ICD-10-CM | POA: Diagnosis not present

## 2023-11-30 DIAGNOSIS — D689 Coagulation defect, unspecified: Secondary | ICD-10-CM | POA: Diagnosis not present

## 2023-11-30 DIAGNOSIS — E785 Hyperlipidemia, unspecified: Secondary | ICD-10-CM | POA: Diagnosis not present

## 2023-11-30 DIAGNOSIS — Z992 Dependence on renal dialysis: Secondary | ICD-10-CM | POA: Diagnosis not present

## 2023-11-30 DIAGNOSIS — K219 Gastro-esophageal reflux disease without esophagitis: Secondary | ICD-10-CM | POA: Diagnosis not present

## 2023-11-30 DIAGNOSIS — D631 Anemia in chronic kidney disease: Secondary | ICD-10-CM | POA: Diagnosis not present

## 2023-11-30 DIAGNOSIS — M542 Cervicalgia: Secondary | ICD-10-CM | POA: Diagnosis not present

## 2023-11-30 DIAGNOSIS — I120 Hypertensive chronic kidney disease with stage 5 chronic kidney disease or end stage renal disease: Secondary | ICD-10-CM | POA: Diagnosis not present

## 2023-11-30 DIAGNOSIS — E46 Unspecified protein-calorie malnutrition: Secondary | ICD-10-CM | POA: Diagnosis not present

## 2023-11-30 DIAGNOSIS — E1140 Type 2 diabetes mellitus with diabetic neuropathy, unspecified: Secondary | ICD-10-CM | POA: Diagnosis not present

## 2023-11-30 DIAGNOSIS — Z79899 Other long term (current) drug therapy: Secondary | ICD-10-CM | POA: Diagnosis not present

## 2023-11-30 DIAGNOSIS — D509 Iron deficiency anemia, unspecified: Secondary | ICD-10-CM | POA: Diagnosis not present

## 2023-11-30 DIAGNOSIS — N186 End stage renal disease: Secondary | ICD-10-CM | POA: Diagnosis not present

## 2023-11-30 DIAGNOSIS — G8929 Other chronic pain: Secondary | ICD-10-CM | POA: Diagnosis not present

## 2023-12-02 ENCOUNTER — Telehealth: Payer: Self-pay

## 2023-12-02 DIAGNOSIS — D631 Anemia in chronic kidney disease: Secondary | ICD-10-CM | POA: Diagnosis not present

## 2023-12-02 DIAGNOSIS — N186 End stage renal disease: Secondary | ICD-10-CM | POA: Diagnosis not present

## 2023-12-02 DIAGNOSIS — Z794 Long term (current) use of insulin: Secondary | ICD-10-CM | POA: Diagnosis not present

## 2023-12-02 DIAGNOSIS — S72001D Fracture of unspecified part of neck of right femur, subsequent encounter for closed fracture with routine healing: Secondary | ICD-10-CM | POA: Diagnosis not present

## 2023-12-02 DIAGNOSIS — E1122 Type 2 diabetes mellitus with diabetic chronic kidney disease: Secondary | ICD-10-CM | POA: Diagnosis not present

## 2023-12-02 DIAGNOSIS — E1140 Type 2 diabetes mellitus with diabetic neuropathy, unspecified: Secondary | ICD-10-CM | POA: Diagnosis not present

## 2023-12-02 DIAGNOSIS — Z9181 History of falling: Secondary | ICD-10-CM | POA: Diagnosis not present

## 2023-12-02 DIAGNOSIS — M542 Cervicalgia: Secondary | ICD-10-CM | POA: Diagnosis not present

## 2023-12-02 DIAGNOSIS — K219 Gastro-esophageal reflux disease without esophagitis: Secondary | ICD-10-CM | POA: Diagnosis not present

## 2023-12-02 DIAGNOSIS — K5909 Other constipation: Secondary | ICD-10-CM | POA: Diagnosis not present

## 2023-12-02 DIAGNOSIS — E785 Hyperlipidemia, unspecified: Secondary | ICD-10-CM | POA: Diagnosis not present

## 2023-12-02 DIAGNOSIS — Z992 Dependence on renal dialysis: Secondary | ICD-10-CM | POA: Diagnosis not present

## 2023-12-02 DIAGNOSIS — G8929 Other chronic pain: Secondary | ICD-10-CM | POA: Diagnosis not present

## 2023-12-02 DIAGNOSIS — E46 Unspecified protein-calorie malnutrition: Secondary | ICD-10-CM | POA: Diagnosis not present

## 2023-12-02 DIAGNOSIS — Z7982 Long term (current) use of aspirin: Secondary | ICD-10-CM | POA: Diagnosis not present

## 2023-12-02 DIAGNOSIS — I120 Hypertensive chronic kidney disease with stage 5 chronic kidney disease or end stage renal disease: Secondary | ICD-10-CM | POA: Diagnosis not present

## 2023-12-02 DIAGNOSIS — Z79899 Other long term (current) drug therapy: Secondary | ICD-10-CM | POA: Diagnosis not present

## 2023-12-02 NOTE — Transitions of Care (Post Inpatient/ED Visit) (Signed)
   12/02/2023  Name: Erica Crosby MRN: [id number] DOB: Feb 02, 1946  Today's TOC FU Call Status: Today's TOC FU Call Status:: Unsuccessful Call (1st Attempt) Unsuccessful Call (1st Attempt) Date: 12/02/23  Attempted to reach the patient regarding the most recent Inpatient/ED visit.  Follow Up Plan: Additional outreach attempts will be made to reach the patient to complete the Transitions of Care (Post Inpatient/ED visit) call.   Signature  [place]ron [place], LPN [facility] Health Advisor [place] [facility] You Are. We Are. One [facility] Direct Dial [phone]

## 2023-12-02 NOTE — Transitions of Care (Post Inpatient/ED Visit) (Signed)
 12/02/2023  Patient ID: Erica Crosby, female   DOB: 05-25-1946, 78 y.o.   MRN: [id number]  Chart Review for transitions of care. Patient discharged from SNF.

## 2023-12-02 NOTE — Telephone Encounter (Signed)
 RN from Pre-Op  service center informed this nurse that patient's daughter called to request rescheduling of her mom's L 2nd stage basilic transposition surgery with Dr. Charlotte Cookey.  This nurse attempted to reach daughter.  LVM.

## 2023-12-03 ENCOUNTER — Telehealth: Payer: Self-pay | Admitting: Family Medicine

## 2023-12-03 ENCOUNTER — Other Ambulatory Visit: Payer: Self-pay

## 2023-12-03 DIAGNOSIS — Z79899 Other long term (current) drug therapy: Secondary | ICD-10-CM | POA: Diagnosis not present

## 2023-12-03 DIAGNOSIS — D631 Anemia in chronic kidney disease: Secondary | ICD-10-CM | POA: Diagnosis not present

## 2023-12-03 DIAGNOSIS — M542 Cervicalgia: Secondary | ICD-10-CM | POA: Diagnosis not present

## 2023-12-03 DIAGNOSIS — Z9181 History of falling: Secondary | ICD-10-CM | POA: Diagnosis not present

## 2023-12-03 DIAGNOSIS — Z992 Dependence on renal dialysis: Secondary | ICD-10-CM | POA: Diagnosis not present

## 2023-12-03 DIAGNOSIS — N186 End stage renal disease: Secondary | ICD-10-CM

## 2023-12-03 DIAGNOSIS — K5909 Other constipation: Secondary | ICD-10-CM | POA: Diagnosis not present

## 2023-12-03 DIAGNOSIS — Z7982 Long term (current) use of aspirin: Secondary | ICD-10-CM | POA: Diagnosis not present

## 2023-12-03 DIAGNOSIS — Z794 Long term (current) use of insulin: Secondary | ICD-10-CM | POA: Diagnosis not present

## 2023-12-03 DIAGNOSIS — E1140 Type 2 diabetes mellitus with diabetic neuropathy, unspecified: Secondary | ICD-10-CM | POA: Diagnosis not present

## 2023-12-03 DIAGNOSIS — S72001D Fracture of unspecified part of neck of right femur, subsequent encounter for closed fracture with routine healing: Secondary | ICD-10-CM | POA: Diagnosis not present

## 2023-12-03 DIAGNOSIS — E46 Unspecified protein-calorie malnutrition: Secondary | ICD-10-CM | POA: Diagnosis not present

## 2023-12-03 DIAGNOSIS — Z13220 Encounter for screening for lipoid disorders: Secondary | ICD-10-CM | POA: Diagnosis not present

## 2023-12-03 DIAGNOSIS — G8929 Other chronic pain: Secondary | ICD-10-CM | POA: Diagnosis not present

## 2023-12-03 DIAGNOSIS — Z23 Encounter for immunization: Secondary | ICD-10-CM | POA: Diagnosis not present

## 2023-12-03 DIAGNOSIS — E785 Hyperlipidemia, unspecified: Secondary | ICD-10-CM | POA: Diagnosis not present

## 2023-12-03 DIAGNOSIS — K219 Gastro-esophageal reflux disease without esophagitis: Secondary | ICD-10-CM | POA: Diagnosis not present

## 2023-12-03 DIAGNOSIS — E1122 Type 2 diabetes mellitus with diabetic chronic kidney disease: Secondary | ICD-10-CM | POA: Diagnosis not present

## 2023-12-03 DIAGNOSIS — I120 Hypertensive chronic kidney disease with stage 5 chronic kidney disease or end stage renal disease: Secondary | ICD-10-CM | POA: Diagnosis not present

## 2023-12-03 NOTE — Telephone Encounter (Signed)
 Copied from CRM [id number]. Topic: Appointments - Scheduling Inquiry for Clinic >> Dec 03, 2023 12:11 PM Antwanette L wrote: Reason for CRM: Patient daughter (nedra) is calling r/s a hospital follow up for the patient. The appt was r/s to 8/6 at 11am.  Nadine Aus prefers the patient to see Dr. Deedee Farmer. Please contact Nedra at [phone]

## 2023-12-04 ENCOUNTER — Inpatient Hospital Stay: Admitting: Family Medicine

## 2023-12-04 NOTE — Telephone Encounter (Signed)
 Spoke with daughter. She is aware next apt 6/27 but pt has another apt that day. Daughter asked to added to wait list.

## 2023-12-05 DIAGNOSIS — N186 End stage renal disease: Secondary | ICD-10-CM | POA: Diagnosis not present

## 2023-12-05 DIAGNOSIS — Z23 Encounter for immunization: Secondary | ICD-10-CM | POA: Diagnosis not present

## 2023-12-05 DIAGNOSIS — Z992 Dependence on renal dialysis: Secondary | ICD-10-CM | POA: Diagnosis not present

## 2023-12-06 DIAGNOSIS — Z992 Dependence on renal dialysis: Secondary | ICD-10-CM | POA: Diagnosis not present

## 2023-12-06 DIAGNOSIS — Z794 Long term (current) use of insulin: Secondary | ICD-10-CM | POA: Diagnosis not present

## 2023-12-06 DIAGNOSIS — E785 Hyperlipidemia, unspecified: Secondary | ICD-10-CM | POA: Diagnosis not present

## 2023-12-06 DIAGNOSIS — S72001D Fracture of unspecified part of neck of right femur, subsequent encounter for closed fracture with routine healing: Secondary | ICD-10-CM | POA: Diagnosis not present

## 2023-12-06 DIAGNOSIS — M542 Cervicalgia: Secondary | ICD-10-CM | POA: Diagnosis not present

## 2023-12-06 DIAGNOSIS — K219 Gastro-esophageal reflux disease without esophagitis: Secondary | ICD-10-CM | POA: Diagnosis not present

## 2023-12-06 DIAGNOSIS — G8929 Other chronic pain: Secondary | ICD-10-CM | POA: Diagnosis not present

## 2023-12-06 DIAGNOSIS — N186 End stage renal disease: Secondary | ICD-10-CM | POA: Diagnosis not present

## 2023-12-06 DIAGNOSIS — E46 Unspecified protein-calorie malnutrition: Secondary | ICD-10-CM | POA: Diagnosis not present

## 2023-12-06 DIAGNOSIS — Z9181 History of falling: Secondary | ICD-10-CM | POA: Diagnosis not present

## 2023-12-06 DIAGNOSIS — I120 Hypertensive chronic kidney disease with stage 5 chronic kidney disease or end stage renal disease: Secondary | ICD-10-CM | POA: Diagnosis not present

## 2023-12-06 DIAGNOSIS — E1122 Type 2 diabetes mellitus with diabetic chronic kidney disease: Secondary | ICD-10-CM | POA: Diagnosis not present

## 2023-12-06 DIAGNOSIS — Z7982 Long term (current) use of aspirin: Secondary | ICD-10-CM | POA: Diagnosis not present

## 2023-12-06 DIAGNOSIS — D631 Anemia in chronic kidney disease: Secondary | ICD-10-CM | POA: Diagnosis not present

## 2023-12-06 DIAGNOSIS — K5909 Other constipation: Secondary | ICD-10-CM | POA: Diagnosis not present

## 2023-12-06 DIAGNOSIS — Z79899 Other long term (current) drug therapy: Secondary | ICD-10-CM | POA: Diagnosis not present

## 2023-12-06 DIAGNOSIS — E1140 Type 2 diabetes mellitus with diabetic neuropathy, unspecified: Secondary | ICD-10-CM | POA: Diagnosis not present

## 2023-12-07 DIAGNOSIS — N186 End stage renal disease: Secondary | ICD-10-CM | POA: Diagnosis not present

## 2023-12-07 DIAGNOSIS — Z23 Encounter for immunization: Secondary | ICD-10-CM | POA: Diagnosis not present

## 2023-12-07 DIAGNOSIS — Z992 Dependence on renal dialysis: Secondary | ICD-10-CM | POA: Diagnosis not present

## 2023-12-09 DIAGNOSIS — K219 Gastro-esophageal reflux disease without esophagitis: Secondary | ICD-10-CM | POA: Diagnosis not present

## 2023-12-09 DIAGNOSIS — Z794 Long term (current) use of insulin: Secondary | ICD-10-CM | POA: Diagnosis not present

## 2023-12-09 DIAGNOSIS — Z992 Dependence on renal dialysis: Secondary | ICD-10-CM | POA: Diagnosis not present

## 2023-12-09 DIAGNOSIS — E1140 Type 2 diabetes mellitus with diabetic neuropathy, unspecified: Secondary | ICD-10-CM | POA: Diagnosis not present

## 2023-12-09 DIAGNOSIS — G8929 Other chronic pain: Secondary | ICD-10-CM | POA: Diagnosis not present

## 2023-12-09 DIAGNOSIS — S72001D Fracture of unspecified part of neck of right femur, subsequent encounter for closed fracture with routine healing: Secondary | ICD-10-CM | POA: Diagnosis not present

## 2023-12-09 DIAGNOSIS — Z9181 History of falling: Secondary | ICD-10-CM | POA: Diagnosis not present

## 2023-12-09 DIAGNOSIS — Z79899 Other long term (current) drug therapy: Secondary | ICD-10-CM | POA: Diagnosis not present

## 2023-12-09 DIAGNOSIS — D631 Anemia in chronic kidney disease: Secondary | ICD-10-CM | POA: Diagnosis not present

## 2023-12-09 DIAGNOSIS — N186 End stage renal disease: Secondary | ICD-10-CM | POA: Diagnosis not present

## 2023-12-09 DIAGNOSIS — K5909 Other constipation: Secondary | ICD-10-CM | POA: Diagnosis not present

## 2023-12-09 DIAGNOSIS — E1122 Type 2 diabetes mellitus with diabetic chronic kidney disease: Secondary | ICD-10-CM | POA: Diagnosis not present

## 2023-12-09 DIAGNOSIS — I120 Hypertensive chronic kidney disease with stage 5 chronic kidney disease or end stage renal disease: Secondary | ICD-10-CM | POA: Diagnosis not present

## 2023-12-09 DIAGNOSIS — Z7982 Long term (current) use of aspirin: Secondary | ICD-10-CM | POA: Diagnosis not present

## 2023-12-09 DIAGNOSIS — E46 Unspecified protein-calorie malnutrition: Secondary | ICD-10-CM | POA: Diagnosis not present

## 2023-12-09 DIAGNOSIS — M542 Cervicalgia: Secondary | ICD-10-CM | POA: Diagnosis not present

## 2023-12-09 DIAGNOSIS — E785 Hyperlipidemia, unspecified: Secondary | ICD-10-CM | POA: Diagnosis not present

## 2023-12-10 DIAGNOSIS — Z992 Dependence on renal dialysis: Secondary | ICD-10-CM | POA: Diagnosis not present

## 2023-12-10 DIAGNOSIS — Z23 Encounter for immunization: Secondary | ICD-10-CM | POA: Diagnosis not present

## 2023-12-10 DIAGNOSIS — N186 End stage renal disease: Secondary | ICD-10-CM | POA: Diagnosis not present

## 2023-12-11 DIAGNOSIS — Z992 Dependence on renal dialysis: Secondary | ICD-10-CM | POA: Diagnosis not present

## 2023-12-11 DIAGNOSIS — Z79899 Other long term (current) drug therapy: Secondary | ICD-10-CM | POA: Diagnosis not present

## 2023-12-11 DIAGNOSIS — K5909 Other constipation: Secondary | ICD-10-CM | POA: Diagnosis not present

## 2023-12-11 DIAGNOSIS — S72001D Fracture of unspecified part of neck of right femur, subsequent encounter for closed fracture with routine healing: Secondary | ICD-10-CM | POA: Diagnosis not present

## 2023-12-11 DIAGNOSIS — E1122 Type 2 diabetes mellitus with diabetic chronic kidney disease: Secondary | ICD-10-CM | POA: Diagnosis not present

## 2023-12-11 DIAGNOSIS — E785 Hyperlipidemia, unspecified: Secondary | ICD-10-CM | POA: Diagnosis not present

## 2023-12-11 DIAGNOSIS — E46 Unspecified protein-calorie malnutrition: Secondary | ICD-10-CM | POA: Diagnosis not present

## 2023-12-11 DIAGNOSIS — Z7982 Long term (current) use of aspirin: Secondary | ICD-10-CM | POA: Diagnosis not present

## 2023-12-11 DIAGNOSIS — I120 Hypertensive chronic kidney disease with stage 5 chronic kidney disease or end stage renal disease: Secondary | ICD-10-CM | POA: Diagnosis not present

## 2023-12-11 DIAGNOSIS — G8929 Other chronic pain: Secondary | ICD-10-CM | POA: Diagnosis not present

## 2023-12-11 DIAGNOSIS — D631 Anemia in chronic kidney disease: Secondary | ICD-10-CM | POA: Diagnosis not present

## 2023-12-11 DIAGNOSIS — K219 Gastro-esophageal reflux disease without esophagitis: Secondary | ICD-10-CM | POA: Diagnosis not present

## 2023-12-11 DIAGNOSIS — Z794 Long term (current) use of insulin: Secondary | ICD-10-CM | POA: Diagnosis not present

## 2023-12-11 DIAGNOSIS — E1140 Type 2 diabetes mellitus with diabetic neuropathy, unspecified: Secondary | ICD-10-CM | POA: Diagnosis not present

## 2023-12-11 DIAGNOSIS — Z9181 History of falling: Secondary | ICD-10-CM | POA: Diagnosis not present

## 2023-12-11 DIAGNOSIS — N186 End stage renal disease: Secondary | ICD-10-CM | POA: Diagnosis not present

## 2023-12-11 DIAGNOSIS — M542 Cervicalgia: Secondary | ICD-10-CM | POA: Diagnosis not present

## 2023-12-12 DIAGNOSIS — Z992 Dependence on renal dialysis: Secondary | ICD-10-CM | POA: Diagnosis not present

## 2023-12-12 DIAGNOSIS — N186 End stage renal disease: Secondary | ICD-10-CM | POA: Diagnosis not present

## 2023-12-12 DIAGNOSIS — Z23 Encounter for immunization: Secondary | ICD-10-CM | POA: Diagnosis not present

## 2023-12-13 DIAGNOSIS — H10013 Acute follicular conjunctivitis, bilateral: Secondary | ICD-10-CM | POA: Diagnosis not present

## 2023-12-14 DIAGNOSIS — Z23 Encounter for immunization: Secondary | ICD-10-CM | POA: Diagnosis not present

## 2023-12-14 DIAGNOSIS — N186 End stage renal disease: Secondary | ICD-10-CM | POA: Diagnosis not present

## 2023-12-14 DIAGNOSIS — Z992 Dependence on renal dialysis: Secondary | ICD-10-CM | POA: Diagnosis not present

## 2023-12-16 DIAGNOSIS — N186 End stage renal disease: Secondary | ICD-10-CM | POA: Diagnosis not present

## 2023-12-16 DIAGNOSIS — D631 Anemia in chronic kidney disease: Secondary | ICD-10-CM | POA: Diagnosis not present

## 2023-12-16 DIAGNOSIS — Z9181 History of falling: Secondary | ICD-10-CM | POA: Diagnosis not present

## 2023-12-16 DIAGNOSIS — G8929 Other chronic pain: Secondary | ICD-10-CM | POA: Diagnosis not present

## 2023-12-16 DIAGNOSIS — S72001D Fracture of unspecified part of neck of right femur, subsequent encounter for closed fracture with routine healing: Secondary | ICD-10-CM | POA: Diagnosis not present

## 2023-12-16 DIAGNOSIS — E785 Hyperlipidemia, unspecified: Secondary | ICD-10-CM | POA: Diagnosis not present

## 2023-12-16 DIAGNOSIS — E1140 Type 2 diabetes mellitus with diabetic neuropathy, unspecified: Secondary | ICD-10-CM | POA: Diagnosis not present

## 2023-12-16 DIAGNOSIS — Z79899 Other long term (current) drug therapy: Secondary | ICD-10-CM | POA: Diagnosis not present

## 2023-12-16 DIAGNOSIS — M542 Cervicalgia: Secondary | ICD-10-CM | POA: Diagnosis not present

## 2023-12-16 DIAGNOSIS — E46 Unspecified protein-calorie malnutrition: Secondary | ICD-10-CM | POA: Diagnosis not present

## 2023-12-16 DIAGNOSIS — Z7982 Long term (current) use of aspirin: Secondary | ICD-10-CM | POA: Diagnosis not present

## 2023-12-16 DIAGNOSIS — K219 Gastro-esophageal reflux disease without esophagitis: Secondary | ICD-10-CM | POA: Diagnosis not present

## 2023-12-16 DIAGNOSIS — K5909 Other constipation: Secondary | ICD-10-CM | POA: Diagnosis not present

## 2023-12-16 DIAGNOSIS — Z992 Dependence on renal dialysis: Secondary | ICD-10-CM | POA: Diagnosis not present

## 2023-12-16 DIAGNOSIS — I120 Hypertensive chronic kidney disease with stage 5 chronic kidney disease or end stage renal disease: Secondary | ICD-10-CM | POA: Diagnosis not present

## 2023-12-16 DIAGNOSIS — Z794 Long term (current) use of insulin: Secondary | ICD-10-CM | POA: Diagnosis not present

## 2023-12-16 DIAGNOSIS — E1122 Type 2 diabetes mellitus with diabetic chronic kidney disease: Secondary | ICD-10-CM | POA: Diagnosis not present

## 2023-12-17 DIAGNOSIS — N186 End stage renal disease: Secondary | ICD-10-CM | POA: Diagnosis not present

## 2023-12-17 DIAGNOSIS — Z992 Dependence on renal dialysis: Secondary | ICD-10-CM | POA: Diagnosis not present

## 2023-12-19 DIAGNOSIS — N186 End stage renal disease: Secondary | ICD-10-CM | POA: Diagnosis not present

## 2023-12-19 DIAGNOSIS — Z992 Dependence on renal dialysis: Secondary | ICD-10-CM | POA: Diagnosis not present

## 2023-12-21 DIAGNOSIS — N186 End stage renal disease: Secondary | ICD-10-CM | POA: Diagnosis not present

## 2023-12-21 DIAGNOSIS — Z992 Dependence on renal dialysis: Secondary | ICD-10-CM | POA: Diagnosis not present

## 2023-12-23 ENCOUNTER — Ambulatory Visit

## 2023-12-23 DIAGNOSIS — E1122 Type 2 diabetes mellitus with diabetic chronic kidney disease: Secondary | ICD-10-CM | POA: Diagnosis not present

## 2023-12-23 DIAGNOSIS — K219 Gastro-esophageal reflux disease without esophagitis: Secondary | ICD-10-CM | POA: Diagnosis not present

## 2023-12-23 DIAGNOSIS — E46 Unspecified protein-calorie malnutrition: Secondary | ICD-10-CM | POA: Diagnosis not present

## 2023-12-23 DIAGNOSIS — E1140 Type 2 diabetes mellitus with diabetic neuropathy, unspecified: Secondary | ICD-10-CM | POA: Diagnosis not present

## 2023-12-23 DIAGNOSIS — Z992 Dependence on renal dialysis: Secondary | ICD-10-CM | POA: Diagnosis not present

## 2023-12-23 DIAGNOSIS — N186 End stage renal disease: Secondary | ICD-10-CM

## 2023-12-23 DIAGNOSIS — E785 Hyperlipidemia, unspecified: Secondary | ICD-10-CM

## 2023-12-23 DIAGNOSIS — I120 Hypertensive chronic kidney disease with stage 5 chronic kidney disease or end stage renal disease: Secondary | ICD-10-CM

## 2023-12-23 DIAGNOSIS — D631 Anemia in chronic kidney disease: Secondary | ICD-10-CM | POA: Diagnosis not present

## 2023-12-23 DIAGNOSIS — K5909 Other constipation: Secondary | ICD-10-CM | POA: Diagnosis not present

## 2023-12-23 DIAGNOSIS — Z794 Long term (current) use of insulin: Secondary | ICD-10-CM | POA: Diagnosis not present

## 2023-12-23 DIAGNOSIS — S72001D Fracture of unspecified part of neck of right femur, subsequent encounter for closed fracture with routine healing: Secondary | ICD-10-CM | POA: Diagnosis not present

## 2023-12-24 DIAGNOSIS — N186 End stage renal disease: Secondary | ICD-10-CM | POA: Diagnosis not present

## 2023-12-24 DIAGNOSIS — Z992 Dependence on renal dialysis: Secondary | ICD-10-CM | POA: Diagnosis not present

## 2023-12-25 ENCOUNTER — Other Ambulatory Visit: Payer: Self-pay

## 2023-12-25 ENCOUNTER — Encounter (HOSPITAL_COMMUNITY): Payer: Self-pay | Admitting: Surgery

## 2023-12-25 DIAGNOSIS — E785 Hyperlipidemia, unspecified: Secondary | ICD-10-CM | POA: Diagnosis not present

## 2023-12-25 DIAGNOSIS — Z992 Dependence on renal dialysis: Secondary | ICD-10-CM | POA: Diagnosis not present

## 2023-12-25 DIAGNOSIS — S72001D Fracture of unspecified part of neck of right femur, subsequent encounter for closed fracture with routine healing: Secondary | ICD-10-CM | POA: Diagnosis not present

## 2023-12-25 DIAGNOSIS — G8929 Other chronic pain: Secondary | ICD-10-CM | POA: Diagnosis not present

## 2023-12-25 DIAGNOSIS — K5909 Other constipation: Secondary | ICD-10-CM | POA: Diagnosis not present

## 2023-12-25 DIAGNOSIS — M542 Cervicalgia: Secondary | ICD-10-CM | POA: Diagnosis not present

## 2023-12-25 DIAGNOSIS — Z794 Long term (current) use of insulin: Secondary | ICD-10-CM | POA: Diagnosis not present

## 2023-12-25 DIAGNOSIS — E1122 Type 2 diabetes mellitus with diabetic chronic kidney disease: Secondary | ICD-10-CM | POA: Diagnosis not present

## 2023-12-25 DIAGNOSIS — K219 Gastro-esophageal reflux disease without esophagitis: Secondary | ICD-10-CM | POA: Diagnosis not present

## 2023-12-25 DIAGNOSIS — Z79899 Other long term (current) drug therapy: Secondary | ICD-10-CM | POA: Diagnosis not present

## 2023-12-25 DIAGNOSIS — Z7982 Long term (current) use of aspirin: Secondary | ICD-10-CM | POA: Diagnosis not present

## 2023-12-25 DIAGNOSIS — D631 Anemia in chronic kidney disease: Secondary | ICD-10-CM | POA: Diagnosis not present

## 2023-12-25 DIAGNOSIS — I120 Hypertensive chronic kidney disease with stage 5 chronic kidney disease or end stage renal disease: Secondary | ICD-10-CM | POA: Diagnosis not present

## 2023-12-25 DIAGNOSIS — E46 Unspecified protein-calorie malnutrition: Secondary | ICD-10-CM | POA: Diagnosis not present

## 2023-12-25 DIAGNOSIS — N186 End stage renal disease: Secondary | ICD-10-CM | POA: Diagnosis not present

## 2023-12-25 DIAGNOSIS — E1140 Type 2 diabetes mellitus with diabetic neuropathy, unspecified: Secondary | ICD-10-CM | POA: Diagnosis not present

## 2023-12-25 DIAGNOSIS — Z9181 History of falling: Secondary | ICD-10-CM | POA: Diagnosis not present

## 2023-12-25 NOTE — Progress Notes (Signed)
 PCP - Dr. Norene Fielding Cardiologist - Dr. Lynwood Schilling  PPM/ICD - denies   Chest x-ray - 11/05/23 EKG - 11/05/23 Stress Test - 02/06/16 ECHO - 07/09/22 Cardiac Cath - 02/19/2003  CPAP - denies  Fasting Blood Sugar - 80-100 Checks Blood Sugar twice/day (sensor on right arm)  Blood Thinner Instructions: n/a Aspirin  Instructions: continue  ERAS Protcol - no, NPO  COVID TEST- n/a  Anesthesia review: yes, cardiac hx  Patient verbally denies any shortness of breath, fever, cough and chest pain during phone call      Questions were answered. Patient verbalized understanding of instructions.

## 2023-12-25 NOTE — Pre-Procedure Instructions (Signed)
 -------------  SDW INSTRUCTIONS given:  Your procedure is scheduled on 7/11.  Report to [facility] Main Entrance A at 07:50 A.M., and check in at the Admitting office.  Any questions or running late day of surgery: call [phone]    Remember:  Do not eat or drink after midnight the night before your surgery     Take these medicines the morning of surgery with A SIP OF WATER  ASA, atorvastatin , coreg , pantoprazole              May take these medicines IF NEEDED: tylenol , norco   As of today, STOP taking any  Aleve, Naproxen, Ibuprofen, Motrin, Advil, Goody's, BC's, all herbal medications, fish oil, and all vitamins.  WHAT DO I DO ABOUT MY DIABETES MEDICATION?  THE NIGHT BEFORE SURGERY, take 2.5 units (50%) of insulin  glargine (LANTUS  SOLOSTAR)        HOW TO MANAGE YOUR DIABETES BEFORE AND AFTER SURGERY  Why is it important to control my blood sugar before and after surgery? Improving blood sugar levels before and after surgery helps healing and can limit problems. A way of improving blood sugar control is eating a healthy diet by:  Eating less sugar and carbohydrates  Increasing activity/exercise  Talking with your doctor about reaching your blood sugar goals High blood sugars (greater than 180 mg/dL) can raise your risk of infections and slow your recovery, so you will need to focus on controlling your diabetes during the weeks before surgery. Make sure that the doctor who takes care of your diabetes knows about your planned surgery including the date and location.  How do I manage my blood sugar before surgery? Check your blood sugar at least 4 times a day, starting 2 days before surgery, to make sure that the level is not too high or low.  Check your blood sugar the morning of your surgery when you wake up and every 2 hours until you get to the Short Stay unit.  If your blood sugar is less than 70 mg/dL, you will need to treat for low blood sugar: Do not take  insulin . Treat a low blood sugar (less than 70 mg/dL) with  cup of clear juice (cranberry or apple), 4 glucose tablets, OR glucose gel. Recheck blood sugar in 15 minutes after treatment (to make sure it is greater than 70 mg/dL). If your blood sugar is not greater than 70 mg/dL on recheck, call [phone] for further instructions. Report your blood sugar to the short stay nurse when you get to Short Stay.  If you are admitted to the hospital after surgery: Your blood sugar will be checked by the staff and you will probably be given insulin  after surgery (instead of oral diabetes medicines) to make sure you have good blood sugar levels. The goal for blood sugar control after surgery is 80-180 mg/dL.   Do NOT Smoke (Tobacco/Vaping) 24 hours prior to your procedure  If you use a CPAP at night, you may bring all equipment for your overnight stay.     You will be asked to remove any contacts, glasses, piercing's, hearing aid's, dentures/partials prior to surgery. Please bring cases for these items if needed.     Patients discharged the day of surgery will not be allowed to drive home, and someone needs to stay with them for 24 hours.  SURGICAL WAITING ROOM VISITATION Patients may have no more than 2 support people in the waiting area - these visitors may rotate.   Pre-op  nurse  will coordinate an appropriate time for 1 ADULT support person, who may not rotate, to accompany patient in pre-op .  Children under the age of 53 must have an adult with them who is not the patient and must remain in the main waiting area with an adult.  If the patient needs to stay at the hospital during part of their recovery, the visitor guidelines for inpatient rooms apply.  Please refer to the [facility] website for the visitor guidelines for any additional information.   Special instructions:   [facility]- Preparing For Surgery   Please follow these instructions carefully.   Shower the NIGHT BEFORE  SURGERY and the MORNING OF SURGERY with DIAL Soap.   Pat yourself dry with a CLEAN TOWEL.  Wear CLEAN PAJAMAS to bed the night before surgery  Place CLEAN SHEETS on your bed the night of your first shower and DO NOT SLEEP WITH PETS.   Additional instructions for the day of surgery: DO NOT APPLY any lotions, deodorants, cologne, or perfumes.   Do not wear jewelry or makeup Do not wear nail polish, gel polish, artificial nails, or any other type of covering on natural nails (fingers and toes) Do not bring valuables to the hospital. [facility] is not responsible for valuables/personal belongings. Put on clean/comfortable clothes.  Please brush your teeth.  Ask your nurse before applying any prescription medications to the skin.

## 2023-12-26 DIAGNOSIS — Z992 Dependence on renal dialysis: Secondary | ICD-10-CM | POA: Diagnosis not present

## 2023-12-26 DIAGNOSIS — N186 End stage renal disease: Secondary | ICD-10-CM | POA: Diagnosis not present

## 2023-12-27 ENCOUNTER — Other Ambulatory Visit (HOSPITAL_COMMUNITY): Payer: Self-pay

## 2023-12-27 ENCOUNTER — Ambulatory Visit (HOSPITAL_COMMUNITY)
Admission: RE | Admit: 2023-12-27 | Discharge: 2023-12-27 | Disposition: A | Discharge status: Home / Self Care | Attending: Surgery | Admitting: Surgery

## 2023-12-27 ENCOUNTER — Other Ambulatory Visit: Payer: Self-pay

## 2023-12-27 ENCOUNTER — Ambulatory Visit (HOSPITAL_BASED_OUTPATIENT_CLINIC_OR_DEPARTMENT_OTHER): Payer: Self-pay | Admitting: Vascular Surgery

## 2023-12-27 ENCOUNTER — Encounter (HOSPITAL_COMMUNITY)
Admission: RE | Disposition: A | Discharge status: Home / Self Care | Payer: Self-pay | Source: Home / Self Care | Attending: Surgery

## 2023-12-27 ENCOUNTER — Ambulatory Visit (HOSPITAL_COMMUNITY): Payer: Self-pay | Admitting: Vascular Surgery

## 2023-12-27 DIAGNOSIS — I120 Hypertensive chronic kidney disease with stage 5 chronic kidney disease or end stage renal disease: Secondary | ICD-10-CM

## 2023-12-27 DIAGNOSIS — K279 Peptic ulcer, site unspecified, unspecified as acute or chronic, without hemorrhage or perforation: Secondary | ICD-10-CM | POA: Insufficient documentation

## 2023-12-27 DIAGNOSIS — Z87891 Personal history of nicotine dependence: Secondary | ICD-10-CM | POA: Diagnosis not present

## 2023-12-27 DIAGNOSIS — Z794 Long term (current) use of insulin: Secondary | ICD-10-CM | POA: Diagnosis not present

## 2023-12-27 DIAGNOSIS — Z79899 Other long term (current) drug therapy: Secondary | ICD-10-CM | POA: Diagnosis not present

## 2023-12-27 DIAGNOSIS — I340 Nonrheumatic mitral (valve) insufficiency: Secondary | ICD-10-CM | POA: Diagnosis not present

## 2023-12-27 DIAGNOSIS — I517 Cardiomegaly: Secondary | ICD-10-CM | POA: Insufficient documentation

## 2023-12-27 DIAGNOSIS — N186 End stage renal disease: Secondary | ICD-10-CM

## 2023-12-27 DIAGNOSIS — K219 Gastro-esophageal reflux disease without esophagitis: Secondary | ICD-10-CM | POA: Insufficient documentation

## 2023-12-27 DIAGNOSIS — M25542 Pain in joints of left hand: Secondary | ICD-10-CM | POA: Diagnosis not present

## 2023-12-27 DIAGNOSIS — I2510 Atherosclerotic heart disease of native coronary artery without angina pectoris: Secondary | ICD-10-CM | POA: Diagnosis not present

## 2023-12-27 DIAGNOSIS — D631 Anemia in chronic kidney disease: Secondary | ICD-10-CM | POA: Diagnosis not present

## 2023-12-27 DIAGNOSIS — Z992 Dependence on renal dialysis: Secondary | ICD-10-CM | POA: Insufficient documentation

## 2023-12-27 DIAGNOSIS — M069 Rheumatoid arthritis, unspecified: Secondary | ICD-10-CM | POA: Diagnosis not present

## 2023-12-27 DIAGNOSIS — E1122 Type 2 diabetes mellitus with diabetic chronic kidney disease: Secondary | ICD-10-CM | POA: Insufficient documentation

## 2023-12-27 DIAGNOSIS — Z7982 Long term (current) use of aspirin: Secondary | ICD-10-CM | POA: Diagnosis not present

## 2023-12-27 DIAGNOSIS — W06XXXA Fall from bed, initial encounter: Secondary | ICD-10-CM | POA: Diagnosis not present

## 2023-12-27 HISTORY — PX: BASCILIC VEIN TRANSPOSITION: SHX5742

## 2023-12-27 LAB — POCT I-STAT, CHEM 8
BUN: 31 mg/dL — ABNORMAL HIGH (ref 8–23)
Calcium, Ion: 1.09 mmol/L — ABNORMAL LOW (ref 1.15–1.40)
Chloride: 103 mmol/L (ref 98–111)
Creatinine, Ser: 2.7 mg/dL — ABNORMAL HIGH (ref 0.44–1.00)
Glucose, Bld: 106 mg/dL — ABNORMAL HIGH (ref 70–99)
HCT: 27 % — ABNORMAL LOW (ref 36.0–46.0)
Hemoglobin: 9.2 g/dL — ABNORMAL LOW (ref 12.0–15.0)
Potassium: 4.8 mmol/L (ref 3.5–5.1)
Sodium: 138 mmol/L (ref 135–145)
TCO2: 28 mmol/L (ref 22–32)

## 2023-12-27 LAB — GLUCOSE, CAPILLARY
Glucose-Capillary: 109 mg/dL — ABNORMAL HIGH (ref 70–99)
Glucose-Capillary: 99 mg/dL (ref 70–99)
Glucose-Capillary: 100 mg/dL — ABNORMAL HIGH (ref 70–99)
Glucose-Capillary: 121 mg/dL — ABNORMAL HIGH (ref 70–99)

## 2023-12-27 SURGERY — TRANSPOSITION, VEIN, BASILIC
Anesthesia: General | Site: Arm Upper | Laterality: Left

## 2023-12-27 MED ORDER — FENTANYL CITRATE (PF) 250 MCG/5ML IJ SOLN
INTRAMUSCULAR | Status: AC
  Filled 2023-12-27: qty 5

## 2023-12-27 MED ORDER — PHENYLEPHRINE 80 MCG/ML (10ML) SYRINGE FOR IV PUSH (FOR BLOOD PRESSURE SUPPORT)
PREFILLED_SYRINGE | INTRAVENOUS | Status: DC | PRN
  Administered 2023-12-27: 80 ug via INTRAVENOUS
  Administered 2023-12-27: 160 ug via INTRAVENOUS

## 2023-12-27 MED ORDER — PROPOFOL 10 MG/ML IV BOLUS
INTRAVENOUS | Status: AC
  Filled 2023-12-27: qty 20

## 2023-12-27 MED ORDER — BUPIVACAINE LIPOSOME 1.3 % IJ SUSP
INTRAMUSCULAR | Status: AC
Start: 2023-12-27 — End: 2023-12-27
  Filled 2023-12-27: qty 20

## 2023-12-27 MED ORDER — SURGIFLO WITH THROMBIN (HEMOSTATIC MATRIX KIT) OPTIME
TOPICAL | Status: DC | PRN
  Administered 2023-12-27: 1 via TOPICAL

## 2023-12-27 MED ORDER — DEXAMETHASONE SODIUM PHOSPHATE 10 MG/ML IJ SOLN
INTRAMUSCULAR | Status: DC | PRN
  Administered 2023-12-27: 5 mg via INTRAVENOUS

## 2023-12-27 MED ORDER — EPHEDRINE 5 MG/ML INJ
INTRAVENOUS | Status: AC
Start: 2023-12-27 — End: 2023-12-27
  Filled 2023-12-27: qty 5

## 2023-12-27 MED ORDER — HEPARIN 6000 UNIT IRRIGATION SOLUTION
Status: AC
Start: 2023-12-27 — End: 2023-12-27
  Filled 2023-12-27: qty 500

## 2023-12-27 MED ORDER — BUPIVACAINE HCL (PF) 0.5 % IJ SOLN
INTRAMUSCULAR | Status: AC
  Filled 2023-12-27: qty 30

## 2023-12-27 MED ORDER — PHENYLEPHRINE 80 MCG/ML (10ML) SYRINGE FOR IV PUSH (FOR BLOOD PRESSURE SUPPORT)
PREFILLED_SYRINGE | INTRAVENOUS | Status: AC
  Filled 2023-12-27: qty 10

## 2023-12-27 MED ORDER — FENTANYL CITRATE (PF) 250 MCG/5ML IJ SOLN
INTRAMUSCULAR | Status: DC | PRN
  Administered 2023-12-27: 50 ug via INTRAVENOUS

## 2023-12-27 MED ORDER — INSULIN ASPART 100 UNIT/ML IJ SOLN: 0.0000 [IU] | INTRAMUSCULAR | Status: DC | PRN

## 2023-12-27 MED ORDER — CEFAZOLIN SODIUM-DEXTROSE 2-4 GM/100ML-% IV SOLN
INTRAVENOUS | Status: AC
  Filled 2023-12-27: qty 100

## 2023-12-27 MED ORDER — LIDOCAINE 2% (20 MG/ML) 5 ML SYRINGE
INTRAMUSCULAR | Status: AC
Start: 2023-12-27 — End: 2023-12-27
  Filled 2023-12-27: qty 5

## 2023-12-27 MED ORDER — 0.9 % SODIUM CHLORIDE (POUR BTL) OPTIME
TOPICAL | Status: DC | PRN
Start: 2023-12-27 — End: 2023-12-27
  Administered 2023-12-27: 1000 mL

## 2023-12-27 MED ORDER — CHLORHEXIDINE GLUCONATE 4 % EX SOLN: 60.0000 mL | Freq: Once | CUTANEOUS | Status: DC

## 2023-12-27 MED ORDER — LIDOCAINE 2% (20 MG/ML) 5 ML SYRINGE
INTRAMUSCULAR | Status: DC | PRN
  Administered 2023-12-27: 60 mg via INTRAVENOUS

## 2023-12-27 MED ORDER — CHLORHEXIDINE GLUCONATE 0.12 % MT SOLN
OROMUCOSAL | Status: AC
  Administered 2023-12-27: 15 mL via OROMUCOSAL
  Filled 2023-12-27: qty 15

## 2023-12-27 MED ORDER — CHLORHEXIDINE GLUCONATE 0.12 % MT SOLN: 15.0000 mL | Freq: Once | OROMUCOSAL | Status: AC

## 2023-12-27 MED ORDER — SODIUM CHLORIDE 0.9% FLUSH: 3.0000 mL | INTRAVENOUS | Status: DC | PRN

## 2023-12-27 MED ORDER — CARVEDILOL 6.25 MG PO TABS
6.2500 mg | ORAL_TABLET | Freq: Once | ORAL | Status: AC
  Administered 2023-12-27: 6.25 mg via ORAL
  Filled 2023-12-27: qty 1

## 2023-12-27 MED ORDER — ONDANSETRON HCL 4 MG/2ML IJ SOLN
INTRAMUSCULAR | Status: AC
  Filled 2023-12-27: qty 2

## 2023-12-27 MED ORDER — HEPARIN 6000 UNIT IRRIGATION SOLUTION
Status: DC | PRN
Start: 2023-12-27 — End: 2023-12-27
  Administered 2023-12-27: 1

## 2023-12-27 MED ORDER — PROPOFOL 10 MG/ML IV BOLUS
INTRAVENOUS | Status: DC | PRN
  Administered 2023-12-27: 70 mg via INTRAVENOUS

## 2023-12-27 MED ORDER — OXYCODONE-ACETAMINOPHEN 5-325 MG PO TABS
1.0000 | ORAL_TABLET | Freq: Four times a day (QID) | ORAL | 0 refills | Status: DC | PRN
  Filled 2023-12-27: qty 20, 5d supply, fill #0

## 2023-12-27 MED ORDER — SODIUM CHLORIDE FLUSH 0.9 % IV SOLN
INTRAVENOUS | Status: DC | PRN
  Administered 2023-12-27: 60 mL

## 2023-12-27 MED ORDER — CEFAZOLIN SODIUM-DEXTROSE 2-4 GM/100ML-% IV SOLN
2.0000 g | INTRAVENOUS | Status: AC
  Administered 2023-12-27: 2 g via INTRAVENOUS

## 2023-12-27 MED ORDER — ORAL CARE MOUTH RINSE: 15.0000 mL | Freq: Once | OROMUCOSAL | Status: AC

## 2023-12-27 MED ORDER — SODIUM CHLORIDE 0.9 % IV SOLN: INTRAVENOUS | Status: DC | PRN

## 2023-12-27 MED ORDER — EPHEDRINE SULFATE-NACL 50-0.9 MG/10ML-% IV SOSY
PREFILLED_SYRINGE | INTRAVENOUS | Status: DC | PRN
  Administered 2023-12-27: 10 mg via INTRAVENOUS
  Administered 2023-12-27 (×3): 5 mg via INTRAVENOUS

## 2023-12-27 MED ORDER — ONDANSETRON HCL 4 MG/2ML IJ SOLN
INTRAMUSCULAR | Status: DC | PRN
  Administered 2023-12-27: 4 mg via INTRAVENOUS

## 2023-12-27 MED ORDER — SODIUM CHLORIDE (PF) 0.9 % IJ SOLN
INTRAMUSCULAR | Status: AC
  Filled 2023-12-27: qty 50

## 2023-12-27 MED ORDER — CARVEDILOL 3.125 MG PO TABS
ORAL_TABLET | ORAL | Status: AC
  Filled 2023-12-27: qty 2

## 2023-12-27 MED ORDER — PHENYLEPHRINE HCL-NACL 20-0.9 MG/250ML-% IV SOLN
INTRAVENOUS | Status: DC | PRN
  Administered 2023-12-27: 40 ug/min via INTRAVENOUS

## 2023-12-27 MED ORDER — DEXAMETHASONE SODIUM PHOSPHATE 10 MG/ML IJ SOLN
INTRAMUSCULAR | Status: AC
Start: 2023-12-27 — End: 2023-12-27
  Filled 2023-12-27: qty 1

## 2023-12-27 SURGICAL SUPPLY — 32 items
ARMBAND PINK RESTRICT EXTREMIT (MISCELLANEOUS) ×2 IMPLANT
BAG COUNTER SPONGE SURGICOUNT (BAG) IMPLANT
CANISTER SUCTION 3000ML PPV (SUCTIONS) ×2 IMPLANT
CLIP TI MEDIUM 24 (CLIP) IMPLANT
CLIP TI MEDIUM 6 (CLIP) IMPLANT
CLIP TI WIDE RED SMALL 24 (CLIP) IMPLANT
CLIP TI WIDE RED SMALL 6 (CLIP) IMPLANT
CNTNR URN SCR LID CUP LEK RST (MISCELLANEOUS) ×2 IMPLANT
COVER PROBE W GEL 5X96 (DRAPES) ×2 IMPLANT
DERMABOND ADVANCED .7 DNX12 (GAUZE/BANDAGES/DRESSINGS) ×2 IMPLANT
ELECTRODE REM PT RTRN 9FT ADLT (ELECTROSURGICAL) ×2 IMPLANT
GLOVE SURG SS PI 7.5 STRL IVOR (GLOVE) ×2 IMPLANT
GOWN STRL REUS W/ TWL LRG LVL3 (GOWN DISPOSABLE) ×4 IMPLANT
GOWN STRL REUS W/ TWL XL LVL3 (GOWN DISPOSABLE) ×2 IMPLANT
HEMOSTAT SNOW SURGICEL 2X4 (HEMOSTASIS) IMPLANT
KIT BASIN OR (CUSTOM PROCEDURE TRAY) ×2 IMPLANT
KIT TURNOVER KIT B (KITS) ×2 IMPLANT
NDL 18GX1X1/2 (RX/OR ONLY) (NEEDLE) ×2 IMPLANT
NEEDLE 18GX1X1/2 (RX/OR ONLY) (NEEDLE) ×1 IMPLANT
NS IRRIG 1000ML POUR BTL (IV SOLUTION) ×2 IMPLANT
PACK CV ACCESS (CUSTOM PROCEDURE TRAY) ×2 IMPLANT
PAD ARMBOARD POSITIONER FOAM (MISCELLANEOUS) ×4 IMPLANT
SLING ARM FOAM STRAP LRG (SOFTGOODS) IMPLANT
SURGIFLO W/THROMBIN 8M KIT (HEMOSTASIS) IMPLANT
SUT PROLENE 6 0 BV (SUTURE) ×2 IMPLANT
SUT SILK 2 0 SH (SUTURE) IMPLANT
SUT VIC AB 3-0 SH 27X BRD (SUTURE) ×2 IMPLANT
SUT VIC AB 4-0 PS2 18 (SUTURE) ×2 IMPLANT
SYR 30ML LL (SYRINGE) ×2 IMPLANT
TOWEL GREEN STERILE (TOWEL DISPOSABLE) ×2 IMPLANT
UNDERPAD 30X36 HEAVY ABSORB (UNDERPADS AND DIAPERS) ×2 IMPLANT
WATER STERILE IRR 1000ML POUR (IV SOLUTION) ×2 IMPLANT

## 2023-12-27 NOTE — Op Note (Signed)
    Patient name: Erica Crosby MRN: [id number] DOB: 12/08/1945 Sex: female  12/27/2023 Pre-operative Diagnosis: ESRD Post-operative diagnosis:  Same Surgeon:  Malvina New Assistants:  KYM Damme, PA Procedure:   Revision of left brachiobasilic fistula including transposition, branch ligation Anesthesia:  General Blood Loss:  minimal Specimens:  none  Findings: Excellent caliber basilic vein.  This was divided to the antecubital crease, tunneled and reanastomosed.  Indications: Patient comes in today for her second stage basilic vein transposition  Procedure:  The patient was identified in the holding area and taken to [facility] OR ROOM 12  The patient was then placed supine on the table. general anesthesia was administered.  The patient was prepped and draped in the usual sterile fashion.  A time out was called and antibiotics were administered.  Due to the complexity of case, an experienced assistant was necessary to expeditiously complete the procedure.  She helped with exposure by providing suction and retraction.  She helped with the anastomosis by following the suture.  She helped with wound closure.  Ultrasound was used to map the course of the basilic vein in the left arm.  2 longitudinal incisions were made anterior to the fistula.  Cautery was used to divide the subcutaneous tissue.  I then dissected out the basilic vein from antecubital crease up to the axilla.  Multiple side branches were ligated between silk ties.  The vein was then marked for orientation.  It was occluded with vascular clamps and divided near the antecubital crease.  I then used a ink pen to mark out the course of the tunnel.  This was infiltrated with Exparel .  A curved Gore tunneler was used to create a tunnel.  The vein was then brought through the tunnel making sure to maintain proper orientation.  A primary end to end anastomosis was performed with 6-0 Prolene.  Once this was completed the appropriate flushing maneuvers  were performed.  The clamps were then released.  There were no kinks within the fistula.  There was an excellent thrill.  The wound was then irrigated.  Hemostasis was achieved.  Surgiflo was placed on the dissection bed.  The subcutaneous tissue was closed with running 3-0 Vicryl and the skin was closed with 4-0 Vicryl followed by Dermabond and sterile dressings.  There were no immediate complications.   Disposition: To PACU stable.   ALONSO Malvina New, M.D., [facility] [place] Office: [phone] Pager:  [phone]
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
## 2023-12-27 NOTE — Transfer of Care (Signed)
 Immediate Anesthesia Transfer of Care Note  Patient: Erica Crosby  Procedure(s) Performed: TRANSPOSITION, VEIN, BASILIC (Left: Arm Upper)  Patient Location: PACU  Anesthesia Type:General  Level of Consciousness: drowsy  Airway & Oxygen Therapy: Patient connected to face mask oxygen  Post-op Assessment: Report given to RN and Post -op Vital signs reviewed and stable  Post vital signs: Reviewed and stable  Last Vitals:  Vitals Value Taken Time  BP 147/61 12/27/23 14:52  Temp    Pulse 65 12/27/23 14:52  Resp 11 12/27/23 14:56  SpO2 100 % 12/27/23 14:52  Vitals shown include unfiled device data.  Last Pain:  Vitals:   12/27/23 0835  TempSrc:   PainSc: 10-Worst pain ever      Patients Stated Pain Goal: 2 (12/27/23 9164)  Complications: There were no known notable events for this encounter.

## 2023-12-27 NOTE — Discharge Instructions (Signed)
   [facility]  Discharge Instructions  AV Fistula or Graft Surgery for Dialysis Access  Please refer to the following instructions for your post-procedure care. Your surgeon or physician assistant will discuss any changes with you.  Activity  You may drive the day following your surgery, if you are comfortable and no longer taking prescription pain medication. Resume full activity as the soreness in your incision resolves.  Bathing/Showering  You may shower after you go home. Keep your incision dry for 48 hours. Do not soak in a bathtub, hot tub, or swim until the incision heals completely. You may not shower if you have a hemodialysis catheter.  Incision Care  Clean your incision with mild soap and water after 48 hours. Pat the area dry with a clean towel. You do not need a bandage unless otherwise instructed. Do not apply any ointments or creams to your incision. You may have skin glue on your incision. Do not peel it off. It will come off on its own in about one week. Your arm may swell a bit after surgery. To reduce swelling use pillows to elevate your arm so it is above your heart. Your doctor will tell you if you need to lightly wrap your arm with an ACE bandage.  Diet  Resume your normal diet. There are not special food restrictions following this procedure. In order to heal from your surgery, it is CRITICAL to get adequate nutrition. Your body requires vitamins, minerals, and protein. Vegetables are the best source of vitamins and minerals. Vegetables also provide the perfect balance of protein. Processed food has little nutritional value, so try to avoid this.  Medications  Resume taking all of your medications. If your incision is causing pain, you may take over-the counter pain relievers such as acetaminophen  (Tylenol ). If you were prescribed a stronger pain medication, please be aware these medications can cause nausea and constipation. Prevent  nausea by taking the medication with a snack or meal. Avoid constipation by drinking plenty of fluids and eating foods with high amount of fiber, such as fruits, vegetables, and grains.  Do not take Tylenol  if you are taking prescription pain medications.  Follow up Your surgeon may want to see you in the office following your access surgery. If so, this will be arranged at the time of your surgery.  Please call us  immediately for any of the following conditions:  Increased pain, redness, drainage (pus) from your incision site Fever of 101 degrees or higher Severe or worsening pain at your incision site Hand pain or numbness.  Reduce your risk of vascular disease:  Stop smoking. If you would like help, call [facility] at 1-800-QUIT-NOW ((435) 288-4045) or [place] at 220-057-2123  Manage your cholesterol Maintain a desired weight Control your diabetes Keep your blood pressure down  Dialysis  It will take several weeks to several months for your new dialysis access to be ready for use. Your surgeon will determine when it is okay to use it. Your nephrologist will continue to direct your dialysis. You can continue to use your Permcath until your new access is ready for use.   12/27/2023 Erica Crosby [id number] Nov 19, 1945  Surgeon(s): Serene Gaile ORN, MD  Procedure(s): TRANSPOSITION, VEIN, BASILIC   May stick graft immediately   May stick graft on designated area only:   X Do not stick left AV fistula for 6 weeks    If you have any questions, please call the office at [phone].
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
## 2023-12-27 NOTE — Anesthesia Postprocedure Evaluation (Signed)
 Anesthesia Post Note  Patient: Erica Crosby  Procedure(s) Performed: TRANSPOSITION, VEIN, BASILIC (Left: Arm Upper)     Patient location during evaluation: PACU Anesthesia Type: General Level of consciousness: confused and awake (as per preop baseline) Pain management: pain level controlled Vital Signs Assessment: post-procedure vital signs reviewed and stable Respiratory status: spontaneous breathing, nonlabored ventilation, respiratory function stable and patient connected to nasal cannula oxygen Cardiovascular status: blood pressure returned to baseline and stable Postop Assessment: no apparent nausea or vomiting Anesthetic complications: no   There were no known notable events for this encounter.  Last Vitals:  Vitals:   12/27/23 1452 12/27/23 1500  BP: (!) 147/61 (!) 131/55  Pulse: 65 66  Resp: 13 13  Temp: 36.4 C   SpO2: 100% 100%    Last Pain:  Vitals:   12/27/23 1452  TempSrc:   PainSc: 0-No pain                 Rome Ade

## 2023-12-27 NOTE — Anesthesia Procedure Notes (Signed)
 Procedure Name: LMA Insertion Date/Time: 12/27/2023 1:10 PM  Performed by: Christopher Comings, CRNAPre-anesthesia Checklist: Patient identified, Emergency Drugs available, Suction available and Patient being monitored Patient Re-evaluated:Patient Re-evaluated prior to induction Oxygen Delivery Method: Circle system utilized Preoxygenation: Pre-oxygenation with 100% oxygen Induction Type: IV induction Ventilation: Mask ventilation without difficulty LMA: LMA inserted LMA Size: 4.0 Number of attempts: 1 Placement Confirmation: positive ETCO2 and breath sounds checked- equal and bilateral Tube secured with: Tape Dental Injury: Teeth and Oropharynx as per pre-operative assessment

## 2023-12-28 ENCOUNTER — Encounter (HOSPITAL_COMMUNITY): Payer: Self-pay | Admitting: Surgery

## 2023-12-28 DIAGNOSIS — Z992 Dependence on renal dialysis: Secondary | ICD-10-CM | POA: Diagnosis not present

## 2023-12-28 DIAGNOSIS — M6281 Muscle weakness (generalized): Secondary | ICD-10-CM | POA: Diagnosis not present

## 2023-12-28 DIAGNOSIS — M810 Age-related osteoporosis without current pathological fracture: Secondary | ICD-10-CM | POA: Diagnosis not present

## 2023-12-28 DIAGNOSIS — N186 End stage renal disease: Secondary | ICD-10-CM | POA: Diagnosis not present

## 2023-12-28 DIAGNOSIS — S72001D Fracture of unspecified part of neck of right femur, subsequent encounter for closed fracture with routine healing: Secondary | ICD-10-CM | POA: Diagnosis not present

## 2023-12-31 DIAGNOSIS — N186 End stage renal disease: Secondary | ICD-10-CM | POA: Diagnosis not present

## 2023-12-31 DIAGNOSIS — Z992 Dependence on renal dialysis: Secondary | ICD-10-CM | POA: Diagnosis not present

## 2024-01-02 DIAGNOSIS — N186 End stage renal disease: Secondary | ICD-10-CM | POA: Diagnosis not present

## 2024-01-02 DIAGNOSIS — Z992 Dependence on renal dialysis: Secondary | ICD-10-CM | POA: Diagnosis not present

## 2024-01-03 DIAGNOSIS — S72001D Fracture of unspecified part of neck of right femur, subsequent encounter for closed fracture with routine healing: Secondary | ICD-10-CM | POA: Diagnosis not present

## 2024-01-03 DIAGNOSIS — G8929 Other chronic pain: Secondary | ICD-10-CM | POA: Diagnosis not present

## 2024-01-03 DIAGNOSIS — Z992 Dependence on renal dialysis: Secondary | ICD-10-CM | POA: Diagnosis not present

## 2024-01-03 DIAGNOSIS — N186 End stage renal disease: Secondary | ICD-10-CM | POA: Diagnosis not present

## 2024-01-03 DIAGNOSIS — M542 Cervicalgia: Secondary | ICD-10-CM | POA: Diagnosis not present

## 2024-01-03 DIAGNOSIS — D631 Anemia in chronic kidney disease: Secondary | ICD-10-CM | POA: Diagnosis not present

## 2024-01-03 DIAGNOSIS — E1140 Type 2 diabetes mellitus with diabetic neuropathy, unspecified: Secondary | ICD-10-CM | POA: Diagnosis not present

## 2024-01-03 DIAGNOSIS — K5909 Other constipation: Secondary | ICD-10-CM | POA: Diagnosis not present

## 2024-01-03 DIAGNOSIS — E1122 Type 2 diabetes mellitus with diabetic chronic kidney disease: Secondary | ICD-10-CM | POA: Diagnosis not present

## 2024-01-03 DIAGNOSIS — E46 Unspecified protein-calorie malnutrition: Secondary | ICD-10-CM | POA: Diagnosis not present

## 2024-01-03 DIAGNOSIS — I120 Hypertensive chronic kidney disease with stage 5 chronic kidney disease or end stage renal disease: Secondary | ICD-10-CM | POA: Diagnosis not present

## 2024-01-03 DIAGNOSIS — Z79899 Other long term (current) drug therapy: Secondary | ICD-10-CM | POA: Diagnosis not present

## 2024-01-03 DIAGNOSIS — Z794 Long term (current) use of insulin: Secondary | ICD-10-CM | POA: Diagnosis not present

## 2024-01-03 DIAGNOSIS — Z9181 History of falling: Secondary | ICD-10-CM | POA: Diagnosis not present

## 2024-01-03 DIAGNOSIS — Z7982 Long term (current) use of aspirin: Secondary | ICD-10-CM | POA: Diagnosis not present

## 2024-01-03 DIAGNOSIS — E785 Hyperlipidemia, unspecified: Secondary | ICD-10-CM | POA: Diagnosis not present

## 2024-01-03 DIAGNOSIS — K219 Gastro-esophageal reflux disease without esophagitis: Secondary | ICD-10-CM | POA: Diagnosis not present

## 2024-01-04 DIAGNOSIS — N186 End stage renal disease: Secondary | ICD-10-CM | POA: Diagnosis not present

## 2024-01-04 DIAGNOSIS — Z992 Dependence on renal dialysis: Secondary | ICD-10-CM | POA: Diagnosis not present

## 2024-01-07 DIAGNOSIS — N186 End stage renal disease: Secondary | ICD-10-CM | POA: Diagnosis not present

## 2024-01-07 DIAGNOSIS — Z992 Dependence on renal dialysis: Secondary | ICD-10-CM | POA: Diagnosis not present

## 2024-01-08 DIAGNOSIS — E1122 Type 2 diabetes mellitus with diabetic chronic kidney disease: Secondary | ICD-10-CM | POA: Diagnosis not present

## 2024-01-08 DIAGNOSIS — N186 End stage renal disease: Secondary | ICD-10-CM | POA: Diagnosis not present

## 2024-01-08 DIAGNOSIS — Z7982 Long term (current) use of aspirin: Secondary | ICD-10-CM | POA: Diagnosis not present

## 2024-01-08 DIAGNOSIS — S72001D Fracture of unspecified part of neck of right femur, subsequent encounter for closed fracture with routine healing: Secondary | ICD-10-CM | POA: Diagnosis not present

## 2024-01-08 DIAGNOSIS — Z794 Long term (current) use of insulin: Secondary | ICD-10-CM | POA: Diagnosis not present

## 2024-01-08 DIAGNOSIS — G8929 Other chronic pain: Secondary | ICD-10-CM | POA: Diagnosis not present

## 2024-01-08 DIAGNOSIS — M542 Cervicalgia: Secondary | ICD-10-CM | POA: Diagnosis not present

## 2024-01-08 DIAGNOSIS — E1140 Type 2 diabetes mellitus with diabetic neuropathy, unspecified: Secondary | ICD-10-CM | POA: Diagnosis not present

## 2024-01-08 DIAGNOSIS — K5909 Other constipation: Secondary | ICD-10-CM | POA: Diagnosis not present

## 2024-01-08 DIAGNOSIS — K219 Gastro-esophageal reflux disease without esophagitis: Secondary | ICD-10-CM | POA: Diagnosis not present

## 2024-01-08 DIAGNOSIS — Z79899 Other long term (current) drug therapy: Secondary | ICD-10-CM | POA: Diagnosis not present

## 2024-01-08 DIAGNOSIS — Z992 Dependence on renal dialysis: Secondary | ICD-10-CM | POA: Diagnosis not present

## 2024-01-08 DIAGNOSIS — E785 Hyperlipidemia, unspecified: Secondary | ICD-10-CM | POA: Diagnosis not present

## 2024-01-08 DIAGNOSIS — E46 Unspecified protein-calorie malnutrition: Secondary | ICD-10-CM | POA: Diagnosis not present

## 2024-01-08 DIAGNOSIS — I120 Hypertensive chronic kidney disease with stage 5 chronic kidney disease or end stage renal disease: Secondary | ICD-10-CM | POA: Diagnosis not present

## 2024-01-08 DIAGNOSIS — D631 Anemia in chronic kidney disease: Secondary | ICD-10-CM | POA: Diagnosis not present

## 2024-01-08 DIAGNOSIS — Z9181 History of falling: Secondary | ICD-10-CM | POA: Diagnosis not present

## 2024-01-09 ENCOUNTER — Telehealth: Payer: Self-pay | Admitting: Family Medicine

## 2024-01-09 DIAGNOSIS — Z992 Dependence on renal dialysis: Secondary | ICD-10-CM | POA: Diagnosis not present

## 2024-01-09 DIAGNOSIS — N186 End stage renal disease: Secondary | ICD-10-CM | POA: Diagnosis not present

## 2024-01-11 DIAGNOSIS — N186 End stage renal disease: Secondary | ICD-10-CM | POA: Diagnosis not present

## 2024-01-11 DIAGNOSIS — Z992 Dependence on renal dialysis: Secondary | ICD-10-CM | POA: Diagnosis not present

## 2024-01-14 DIAGNOSIS — Z992 Dependence on renal dialysis: Secondary | ICD-10-CM | POA: Diagnosis not present

## 2024-01-14 DIAGNOSIS — N186 End stage renal disease: Secondary | ICD-10-CM | POA: Diagnosis not present

## 2024-01-16 DIAGNOSIS — Z992 Dependence on renal dialysis: Secondary | ICD-10-CM | POA: Diagnosis not present

## 2024-01-16 DIAGNOSIS — N186 End stage renal disease: Secondary | ICD-10-CM | POA: Diagnosis not present

## 2024-01-18 DIAGNOSIS — N186 End stage renal disease: Secondary | ICD-10-CM | POA: Diagnosis not present

## 2024-01-18 DIAGNOSIS — Z992 Dependence on renal dialysis: Secondary | ICD-10-CM | POA: Diagnosis not present

## 2024-01-20 ENCOUNTER — Encounter

## 2024-01-21 DIAGNOSIS — Z992 Dependence on renal dialysis: Secondary | ICD-10-CM | POA: Diagnosis not present

## 2024-01-21 DIAGNOSIS — N186 End stage renal disease: Secondary | ICD-10-CM | POA: Diagnosis not present

## 2024-01-22 ENCOUNTER — Ambulatory Visit (INDEPENDENT_AMBULATORY_CARE_PROVIDER_SITE_OTHER): Admitting: Family Medicine

## 2024-01-22 ENCOUNTER — Encounter: Payer: Self-pay | Admitting: Family Medicine

## 2024-01-22 VITALS — BP 156/62 | HR 78 | Temp 97.8°F | Ht 66.0 in | Wt 107.6 lb

## 2024-01-22 DIAGNOSIS — R413 Other amnesia: Secondary | ICD-10-CM

## 2024-01-22 DIAGNOSIS — N186 End stage renal disease: Secondary | ICD-10-CM

## 2024-01-22 DIAGNOSIS — Z992 Dependence on renal dialysis: Secondary | ICD-10-CM

## 2024-01-22 NOTE — Progress Notes (Signed)
 Subjective: CC: ESRD on HD PCP: Erica Erica HERO, Erica Crosby HPI:Erica Crosby is a 78 y.o. female presenting to clinic today for:  1.  ESRD on HD Patient is accompanied today by her daughter Erica Crosby.  She was post to be seen just after discharge from the hospital on 18 July but her daughter had to work so today was the earliest appointment she could get.  She had some surgical changes done to the fistula in the left upper extremity.  She continues to get hemodialysis Tuesdays, Thursdays and Saturdays through the port in her chest.  She supposed to be seeing the surgeon soon again for reevaluation.  Has some dry skin around the surgical site but otherwise no concerns.  Appetite has been good.  2.  Memory changes Her daughter tells me separately that she is been having increasing irritability and memory changes.  Wants to see neurology for further evaluation   ROS: Per HPI  Allergies  Allergen Reactions   Iodinated Contrast Media     Unknown reaction    Latex     Unknown reaction    Lisinopril Swelling    lips   Shellfish Allergy     Unknown Reaction    Past Medical History:  Diagnosis Date   Anemia    Arthritis    Cataract    Coronary artery disease    Mild plaque 2012   Diabetes mellitus    ESRD on hemodialysis (HCC)    GERD (gastroesophageal reflux disease)    Hyperlipidemia    Hypertension    Neuropathy    Peptic ulcer    Rheumatoid arthritis (HCC)    Syncope 02/10/2012    Current Outpatient Medications:    acetaminophen  (TYLENOL ) 500 MG tablet, Take 500 mg by mouth every 6 (six) hours as needed for moderate pain (pain score 4-6)., Disp: , Rfl:    aspirin  EC 81 MG tablet, Take 81 mg by mouth in the morning., Disp: , Rfl:    atorvastatin  (LIPITOR) 10 MG tablet, Take 1 tablet (10 mg total) by mouth daily. (Patient taking differently: Take 10 mg by mouth every evening.), Disp: 90 tablet, Rfl: 3   carvedilol  (COREG ) 6.25 MG tablet, Take 6.25 mg by mouth 2 (two) times daily.,  Disp: , Rfl:    chlorhexidine  (HIBICLENS ) 4 % external liquid, Apply 15 mLs (1 Application total) topically as directed for 30 doses. Use as directed daily for 5 days every other week for 6 weeks., Disp: 946 mL, Rfl: 1   Continuous Glucose Sensor ([name] 2 SENSOR) MISC, USE TO TEST BLOOD SUGAR CONTINUOUSLY. Dx: E11.65. ADVANCED DIABETES SUPPLY VIA PARACHUTE, Disp: 6 each, Rfl: 3   folic acid  (FOLVITE ) 1 MG tablet, Take 1 tablet (1 mg total) by mouth daily., Disp: 90 tablet, Rfl: 3   insulin  glargine (LANTUS  SOLOSTAR) 100 UNIT/ML Solostar Pen, Inject 5 Units into the skin daily., Disp: , Rfl:    Omega-3 Fatty Acids (FISH OIL PO), Take 1 capsule by mouth in the morning., Disp: , Rfl:    pantoprazole  (PROTONIX ) 40 MG tablet, TAKE ONE TABLET DAILY, Disp: 90 tablet, Rfl: 0   RESTASIS 0.05 % ophthalmic emulsion, 1 drop 2 (two) times daily., Disp: , Rfl:  Social History   Socioeconomic History   Marital status: Widowed    Spouse name: Not on file   Number of children: 3   Years of education: 12   Highest education level: High school graduate  Occupational History   Occupation: retired  Tobacco Use   Smoking status: Former    Current packs/day: 0.00    Types: Cigarettes, E-cigarettes    Start date: 02/03/1988    Quit date: 02/02/2013    Years since quitting: 10.9   Smokeless tobacco: Never  Vaping Use   Vaping status: Former   Quit date: 11/05/2023  Substance and Sexual Activity   Alcohol use: No   Drug use: No   Sexual activity: Not Currently    Birth control/protection: Post-menopausal  Other Topics Concern   Not on file  Social History Narrative   Daughter stays with her.    Son comes daily when daughter is working   Teacher, early years/pre Strain: Low Risk  (11/16/2022)   Overall Financial Resource Strain (CARDIA)    Difficulty of Paying Living Expenses: Not hard at all  Food Insecurity: No Food Insecurity (11/04/2023)   Hunger Vital Sign     Worried About Running Out of Food in the Last Year: Never true    Ran Out of Food in the Last Year: Never true  Transportation Needs: No Transportation Needs (11/04/2023)   PRAPARE - Administrator, Civil Service (Medical): No    Lack of Transportation (Non-Medical): No  Physical Activity: Insufficiently Active (11/16/2022)   Exercise Vital Sign    Days of Exercise per Week: 7 days    Minutes of Exercise per Session: 20 min  Stress: No Stress Concern Present (11/16/2022)   [employer] of Occupational Health - Occupational Stress Questionnaire    Feeling of Stress : Not at all  Social Connections: Unknown (11/04/2023)   Social Connection and Isolation Panel    Frequency of Communication with Friends and Family: Twice a week    Frequency of Social Gatherings with Friends and Family: Once a week    Attends Religious Services: Not on Insurance claims handler of Clubs or Organizations: Not on file    Attends Banker Meetings: Not on file    Marital Status: Widowed  Intimate Partner Violence: Unknown (11/04/2023)   Humiliation, Afraid, Rape, and Kick questionnaire    Fear of Current or Ex-Partner: No    Emotionally Abused: No    Physically Abused: Not on file    Sexually Abused: No   Family History  Problem Relation Age of Onset   Heart disease Sister        Congeital (died age 68) with enlarged heart   CAD Sister    Diabetes Mother    Heart disease Mother        Later onset heart disease    Dementia Mother    Alcohol abuse Father    Liver disease Father    CAD Sister 11       CABG   Early death Sister    Diabetes Brother    Kidney disease Brother        related to DM   Diabetes Brother     Objective: Office vital signs reviewed. BP (!) 156/62   Pulse 78   Temp 97.8 F (36.6 C) (Temporal)   Ht 5' 6 (1.676 m)   Wt 107 lb 9.6 oz (48.8 kg)   BMI 17.37 kg/m   Physical Examination:  General: Awake, alert, thin female, No acute distress HEENT:  sclera white, MMM Cardio: regular rate and rhythm, S1S2 heard, no murmurs appreciated Pulm: clear to auscultation bilaterally, no wheezes, rhonchi or rales; normal work of breathing on room air Extremities:  Clean postsurgical site noted along the medial aspect of the left upper extremity.  She has AV fistula in place. Neuro: Irritable.  Thought is tangential  Assessment/ Plan: 78 y.o. female   End-stage renal disease on hemodialysis (HCC)  Memory change - Plan: Ambulatory referral to Neurology, CANCELED: Ambulatory referral to Neurology  Continue to follow-up with nephrology as directed  Referral to neurology placed.  Will need them to contact her daughter for appointment   Erica CHRISTELLA Fielding, Erica Crosby [facility] [phone]

## 2024-01-23 DIAGNOSIS — N186 End stage renal disease: Secondary | ICD-10-CM | POA: Diagnosis not present

## 2024-01-23 DIAGNOSIS — Z992 Dependence on renal dialysis: Secondary | ICD-10-CM | POA: Diagnosis not present

## 2024-01-24 ENCOUNTER — Encounter

## 2024-01-25 DIAGNOSIS — N186 End stage renal disease: Secondary | ICD-10-CM | POA: Diagnosis not present

## 2024-01-25 DIAGNOSIS — Z992 Dependence on renal dialysis: Secondary | ICD-10-CM | POA: Diagnosis not present

## 2024-01-27 ENCOUNTER — Other Ambulatory Visit: Payer: Self-pay | Admitting: Family Medicine

## 2024-01-27 DIAGNOSIS — E1169 Type 2 diabetes mellitus with other specified complication: Secondary | ICD-10-CM

## 2024-01-28 ENCOUNTER — Encounter

## 2024-01-28 DIAGNOSIS — Z992 Dependence on renal dialysis: Secondary | ICD-10-CM | POA: Diagnosis not present

## 2024-01-28 DIAGNOSIS — M810 Age-related osteoporosis without current pathological fracture: Secondary | ICD-10-CM | POA: Diagnosis not present

## 2024-01-28 DIAGNOSIS — M6281 Muscle weakness (generalized): Secondary | ICD-10-CM | POA: Diagnosis not present

## 2024-01-28 DIAGNOSIS — N186 End stage renal disease: Secondary | ICD-10-CM | POA: Diagnosis not present

## 2024-01-28 DIAGNOSIS — S72001D Fracture of unspecified part of neck of right femur, subsequent encounter for closed fracture with routine healing: Secondary | ICD-10-CM | POA: Diagnosis not present

## 2024-01-29 ENCOUNTER — Ambulatory Visit: Attending: Vascular Surgery | Admitting: Physician Assistant

## 2024-01-29 VITALS — BP 127/54 | HR 69 | Temp 97.7°F | Wt 107.0 lb

## 2024-01-29 DIAGNOSIS — N186 End stage renal disease: Secondary | ICD-10-CM

## 2024-01-29 NOTE — Progress Notes (Signed)
    Postoperative Access Visit   History of Present Illness   Erica Crosby is a 78 y.o. year old female who presents for postoperative follow-up for: left second stage basilic vein transposition (Date: 12/27/23).  The patient's wounds are healed.  The patient denies steal symptoms.  The patient is able to complete their activities of daily living.  She is dialyzing via right IJ [facility] on a Tuesday Thursday Saturday schedule at [facility] [place].   Physical Examination   Vitals:   01/29/24 1219  BP: (!) 127/54  Pulse: 69  Temp: 97.7 F (36.5 C)  TempSrc: Temporal  Weight: 107 lb (48.5 kg)   Body mass index is 17.27 kg/m.  left arm Incisions are healed, palpable radial pulse, hand grip is 5/5, sensation in digits is intact, palpable thrill, bruit can be auscultated     Medical Decision Making   Erica Crosby is a 78 y.o. year old female who presents s/p left second stage basilic vein transposition  Patent brachiobasilic fistula without signs or symptoms of steal syndrome The patient's access will be ready for use tomorrow 01/30/2024 The patient's tunneled dialysis catheter can be removed when Nephrology is comfortable with the performance of the fistula The patient may follow up on a prn basis   Erica Sender PA-C [facility] Office: [phone]  Clinic MD: Sheree

## 2024-01-30 DIAGNOSIS — N186 End stage renal disease: Secondary | ICD-10-CM | POA: Diagnosis not present

## 2024-01-30 DIAGNOSIS — Z992 Dependence on renal dialysis: Secondary | ICD-10-CM | POA: Diagnosis not present

## 2024-02-01 DIAGNOSIS — Z992 Dependence on renal dialysis: Secondary | ICD-10-CM | POA: Diagnosis not present

## 2024-02-01 DIAGNOSIS — N186 End stage renal disease: Secondary | ICD-10-CM | POA: Diagnosis not present

## 2024-02-04 DIAGNOSIS — N186 End stage renal disease: Secondary | ICD-10-CM | POA: Diagnosis not present

## 2024-02-04 DIAGNOSIS — Z992 Dependence on renal dialysis: Secondary | ICD-10-CM | POA: Diagnosis not present

## 2024-02-05 DIAGNOSIS — H25813 Combined forms of age-related cataract, bilateral: Secondary | ICD-10-CM | POA: Diagnosis not present

## 2024-02-05 DIAGNOSIS — E113312 Type 2 diabetes mellitus with moderate nonproliferative diabetic retinopathy with macular edema, left eye: Secondary | ICD-10-CM | POA: Diagnosis not present

## 2024-02-05 DIAGNOSIS — H2512 Age-related nuclear cataract, left eye: Secondary | ICD-10-CM | POA: Diagnosis not present

## 2024-02-05 DIAGNOSIS — Z01818 Encounter for other preprocedural examination: Secondary | ICD-10-CM | POA: Diagnosis not present

## 2024-02-06 DIAGNOSIS — Z992 Dependence on renal dialysis: Secondary | ICD-10-CM | POA: Diagnosis not present

## 2024-02-06 DIAGNOSIS — N186 End stage renal disease: Secondary | ICD-10-CM | POA: Diagnosis not present

## 2024-02-08 DIAGNOSIS — N186 End stage renal disease: Secondary | ICD-10-CM | POA: Diagnosis not present

## 2024-02-08 DIAGNOSIS — Z992 Dependence on renal dialysis: Secondary | ICD-10-CM | POA: Diagnosis not present

## 2024-02-11 DIAGNOSIS — N186 End stage renal disease: Secondary | ICD-10-CM | POA: Diagnosis not present

## 2024-02-11 DIAGNOSIS — Z992 Dependence on renal dialysis: Secondary | ICD-10-CM | POA: Diagnosis not present

## 2024-02-13 ENCOUNTER — Encounter: Payer: Self-pay | Admitting: Physician Assistant

## 2024-02-13 DIAGNOSIS — Z992 Dependence on renal dialysis: Secondary | ICD-10-CM | POA: Diagnosis not present

## 2024-02-13 DIAGNOSIS — N186 End stage renal disease: Secondary | ICD-10-CM | POA: Diagnosis not present

## 2024-02-15 DIAGNOSIS — N186 End stage renal disease: Secondary | ICD-10-CM | POA: Diagnosis not present

## 2024-02-15 DIAGNOSIS — Z992 Dependence on renal dialysis: Secondary | ICD-10-CM | POA: Diagnosis not present

## 2024-02-16 DIAGNOSIS — N186 End stage renal disease: Secondary | ICD-10-CM | POA: Diagnosis not present

## 2024-02-16 DIAGNOSIS — Z992 Dependence on renal dialysis: Secondary | ICD-10-CM | POA: Diagnosis not present

## 2024-02-18 DIAGNOSIS — N186 End stage renal disease: Secondary | ICD-10-CM | POA: Diagnosis not present

## 2024-02-18 DIAGNOSIS — Z992 Dependence on renal dialysis: Secondary | ICD-10-CM | POA: Diagnosis not present

## 2024-02-20 ENCOUNTER — Encounter: Payer: Self-pay | Admitting: Nephrology

## 2024-02-20 ENCOUNTER — Ambulatory Visit: Payer: Self-pay

## 2024-02-20 DIAGNOSIS — N186 End stage renal disease: Secondary | ICD-10-CM | POA: Diagnosis not present

## 2024-02-20 DIAGNOSIS — Z992 Dependence on renal dialysis: Secondary | ICD-10-CM | POA: Diagnosis not present

## 2024-02-20 NOTE — Telephone Encounter (Signed)
 FYI Only or Action Required?: Action required by provider: request for appointment.  Patient was last seen in primary care on 01/22/2024 by Jolinda Norene HERO, DO.  Called Nurse Triage reporting Urinary symptoms.  Symptoms began several days ago.  Interventions attempted: Nothing.  Symptoms are: unchanged. Has pain and odor to urine. Pt. In dialysis today until 4:30. Has eye surgery tomorrow at 0745. Practice currently closed for lunch. Please advise daughter.  Triage Disposition: See Physician Within 24 Hours  Patient/caregiver understands and will follow disposition?: Yes   Copied from CRM [id number]. Topic: Clinical - Red Word Triage >> Feb 20, 2024 12:11 PM Erica Crosby wrote: Red Word that prompted transfer to Nurse Triage: Patients daughter calling and the patient is at dialysis  patient having pain or burning when she urinates, and a strong odor possible UTI concerns  Pt num [phone] (M) Reason for Disposition  Bad or foul-smelling urine  Answer Assessment - Initial Assessment Questions 1. SYMPTOM: What's the main symptom you're concerned about? (e.g., frequency, incontinence)     Pain, odor 2. ONSET: When did the    start?     Tuesday 3. PAIN: Is there any pain? If Yes, ask: How bad is it? (Scale: 1-10; mild, moderate, severe)     mild 4. CAUSE: What do you think is causing the symptoms?     UTI 5. OTHER SYMPTOMS: Do you have any other symptoms? (e.g., blood in urine, fever, flank pain, pain with urination)     no 6. PREGNANCY: Is there any chance you are pregnant? When was your last menstrual period?     no  Protocols used: Urinary Symptoms-A-AH

## 2024-02-20 NOTE — Telephone Encounter (Signed)
 Erica Crosby is putting appt in for video 02-21-2024 with [facility].

## 2024-02-21 ENCOUNTER — Encounter: Payer: Self-pay | Admitting: Family

## 2024-02-21 ENCOUNTER — Telehealth: Admitting: Family

## 2024-02-21 DIAGNOSIS — H25812 Combined forms of age-related cataract, left eye: Secondary | ICD-10-CM | POA: Diagnosis not present

## 2024-02-21 DIAGNOSIS — B3731 Acute candidiasis of vulva and vagina: Secondary | ICD-10-CM | POA: Diagnosis not present

## 2024-02-21 DIAGNOSIS — N186 End stage renal disease: Secondary | ICD-10-CM | POA: Diagnosis not present

## 2024-02-21 DIAGNOSIS — R300 Dysuria: Secondary | ICD-10-CM | POA: Diagnosis not present

## 2024-02-21 DIAGNOSIS — E1122 Type 2 diabetes mellitus with diabetic chronic kidney disease: Secondary | ICD-10-CM | POA: Diagnosis not present

## 2024-02-21 DIAGNOSIS — E1136 Type 2 diabetes mellitus with diabetic cataract: Secondary | ICD-10-CM | POA: Diagnosis not present

## 2024-02-21 DIAGNOSIS — H2512 Age-related nuclear cataract, left eye: Secondary | ICD-10-CM | POA: Diagnosis not present

## 2024-02-21 DIAGNOSIS — H268 Other specified cataract: Secondary | ICD-10-CM | POA: Diagnosis not present

## 2024-02-21 LAB — MICROSCOPIC EXAMINATION
RBC, Urine: NONE SEEN /HPF (ref 0–2)
Renal Epithel, UA: NONE SEEN /HPF

## 2024-02-21 LAB — URINALYSIS, COMPLETE
Bilirubin, UA: NEGATIVE
Glucose, UA: NEGATIVE
Ketones, UA: NEGATIVE
Nitrite, UA: NEGATIVE
RBC, UA: NEGATIVE
Specific Gravity, UA: 1.015 (ref 1.005–1.030)
Urobilinogen, Ur: 1 mg/dL (ref 0.2–1.0)
pH, UA: 8.5 — ABNORMAL HIGH (ref 5.0–7.5)

## 2024-02-21 MED ORDER — FLUCONAZOLE 150 MG PO TABS: 150.0000 mg | ORAL_TABLET | ORAL | 0 refills | Status: AC | PRN

## 2024-02-21 NOTE — Progress Notes (Signed)
 Virtual Visit Consent   Erica Crosby, you are scheduled for a virtual visit with a [facility] provider today. Just as with appointments in the office, your consent must be obtained to participate. Your consent will be active for this visit and any virtual visit you may have with one of our providers in the next 365 days. If you have a MyChart account, a copy of this consent can be sent to you electronically.  As this is a virtual visit, video technology does not allow for your provider to perform a traditional examination. This may limit your provider's ability to fully assess your condition. If your provider identifies any concerns that need to be evaluated in person or the need to arrange testing (such as labs, EKG, etc.), we will make arrangements to do so. Although advances in technology are sophisticated, we cannot ensure that it will always work on either your end or our end. If the connection with a video visit is poor, the visit may have to be switched to a telephone visit. With either a video or telephone visit, we are not always able to ensure that we have a secure connection.  By engaging in this virtual visit, you consent to the provision of healthcare and authorize for your insurance to be billed (if applicable) for the services provided during this visit. Depending on your insurance coverage, you may receive a charge related to this service.  I need to obtain your verbal consent now. Are you willing to proceed with your visit today? [name] has provided verbal consent on 02/21/2024 for a virtual visit (video or telephone). Bari Learn, FNP  Date: 02/21/2024 12:42 PM   Virtual Visit via Video Note   I, Bari Learn, connected with  Erica Crosby  ([id number], Jun 15, 1946) on 02/21/24 at 12:25 PM EDT by a video-enabled telemedicine application and verified that I am speaking with the correct person using two identifiers.  Location: Patient: Virtual Visit Location Patient:  Home Provider: Virtual Visit Location Provider: Home Office   I discussed the limitations of evaluation and management by telemedicine and the availability of in person appointments. The patient expressed understanding and agreed to proceed.    History of Present Illness: Erica Crosby is a 78 y.o. who identifies as a female who was assigned female at birth, and is being seen today for UTI symptoms that started a month ago on and off.  HPI: Dysuria  This is a new problem. The current episode started 1 to 4 weeks ago. The problem occurs intermittently. The problem has been waxing and waning. The quality of the pain is described as burning. The pain is at a severity of 3/10. Associated symptoms include urgency. Pertinent negatives include no flank pain, hematuria, hesitancy, nausea or vomiting. Associated symptoms comments: Urine dark. She has tried increased fluids for the symptoms. The treatment provided mild relief.    Problems:  Patient Active Problem List   Diagnosis Date Noted   Closed right hip fracture (HCC) 10/31/2023   Physical deconditioning 09/18/2023   Unspecified protein-calorie malnutrition (HCC) 09/18/2023   Impaired mobility and ADLs 09/18/2023   Type 2 diabetes mellitus with diabetic neuropathy, unspecified (HCC) 09/09/2023   Hypothyroidism, unspecified 09/06/2023   Polyneuropathy, unspecified 09/06/2023   End stage renal disease (HCC) 06/14/2022   Nonrheumatic mitral valve regurgitation 02/26/2022   Nodule of kidney 02/11/2022   Gastro-esophageal reflux disease without esophagitis 02/10/2022   Anemia in chronic kidney disease 02/10/2022   Osteoporosis 03/08/2021  Other long term (current) drug therapy 03/08/2021   Rheumatoid lung (HCC) 03/08/2021   Hyperlipidemia, unspecified 07/07/2018   Palpitations 05/07/2018   Rheumatoid arthritis, unspecified (HCC) 03/20/2017   Personal history of noncompliance with medical treatment, presenting hazards to health 12/24/2016    Bulging lumbar disc 08/22/2016   Type 2 diabetes mellitus without complications (HCC) 02/10/2012   Ataxia 02/10/2012    Allergies:  Allergies  Allergen Reactions   Iodinated Contrast Media     Unknown reaction    Latex     Unknown reaction    Lisinopril Swelling    lips   Shellfish Allergy     Unknown Reaction    Medications:  Current Outpatient Medications:    fluconazole  (DIFLUCAN ) 150 MG tablet, Take 1 tablet (150 mg total) by mouth every three (3) days as needed., Disp: 3 tablet, Rfl: 0   acetaminophen  (TYLENOL ) 500 MG tablet, Take 500 mg by mouth every 6 (six) hours as needed for moderate pain (pain score 4-6)., Disp: , Rfl:    aspirin  EC 81 MG tablet, Take 81 mg by mouth in the morning., Disp: , Rfl:    atorvastatin  (LIPITOR) 10 MG tablet, TAKE 1 TABLET DAILY, Disp: 90 tablet, Rfl: 3   carvedilol  (COREG ) 6.25 MG tablet, Take 6.25 mg by mouth 2 (two) times daily., Disp: , Rfl:    chlorhexidine  (HIBICLENS ) 4 % external liquid, Apply 15 mLs (1 Application total) topically as directed for 30 doses. Use as directed daily for 5 days every other week for 6 weeks., Disp: 946 mL, Rfl: 1   Continuous Glucose Sensor ([name] 2 SENSOR) MISC, USE TO TEST BLOOD SUGAR CONTINUOUSLY. Dx: E11.65. ADVANCED DIABETES SUPPLY VIA PARACHUTE, Disp: 6 each, Rfl: 3   folic acid  (FOLVITE ) 1 MG tablet, Take 1 tablet (1 mg total) by mouth daily., Disp: 90 tablet, Rfl: 3   insulin  glargine (LANTUS  SOLOSTAR) 100 UNIT/ML Solostar Pen, Inject 5 Units into the skin daily., Disp: , Rfl:    Omega-3 Fatty Acids (FISH OIL PO), Take 1 capsule by mouth in the morning., Disp: , Rfl:    pantoprazole  (PROTONIX ) 40 MG tablet, TAKE ONE TABLET DAILY, Disp: 90 tablet, Rfl: 0   RESTASIS 0.05 % ophthalmic emulsion, 1 drop 2 (two) times daily., Disp: , Rfl:   Observations/Objective: Patient is well-developed, well-nourished in no acute distress.  Resting comfortably  at home.  Head is normocephalic, atraumatic.  No  labored breathing.  Speech is clear and coherent with logical content.  Patient is alert and oriented at baseline.  Pt laying in bed  Assessment and Plan: 1. Dysuria (Primary) - Urinalysis, Complete - Urine Culture  2. Vagina, candidiasis - fluconazole  (DIFLUCAN ) 150 MG tablet; Take 1 tablet (150 mg total) by mouth every three (3) days as needed.  Dispense: 3 tablet; Refill: 0  Start diflucan   Urine culture pending  Force fluids  Follow up if symptoms worsen or do not improve   Follow Up Instructions: I discussed the assessment and treatment plan with the patient. The patient was provided an opportunity to ask questions and all were answered. The patient agreed with the plan and demonstrated an understanding of the instructions.  A copy of instructions were sent to the patient via MyChart unless otherwise noted below.     The patient was advised to call back or seek an in-person evaluation if the symptoms worsen or if the condition fails to improve as anticipated.    Bari Learn, FNP

## 2024-02-22 DIAGNOSIS — Z992 Dependence on renal dialysis: Secondary | ICD-10-CM | POA: Diagnosis not present

## 2024-02-22 DIAGNOSIS — N186 End stage renal disease: Secondary | ICD-10-CM | POA: Diagnosis not present

## 2024-02-22 LAB — URINE CULTURE

## 2024-02-24 ENCOUNTER — Ambulatory Visit: Payer: Self-pay | Admitting: Family

## 2024-02-25 DIAGNOSIS — N186 End stage renal disease: Secondary | ICD-10-CM | POA: Diagnosis not present

## 2024-02-25 DIAGNOSIS — Z992 Dependence on renal dialysis: Secondary | ICD-10-CM | POA: Diagnosis not present

## 2024-02-26 ENCOUNTER — Other Ambulatory Visit: Payer: Self-pay | Admitting: Family Medicine

## 2024-02-26 DIAGNOSIS — N186 End stage renal disease: Secondary | ICD-10-CM | POA: Diagnosis not present

## 2024-02-26 DIAGNOSIS — Z992 Dependence on renal dialysis: Secondary | ICD-10-CM | POA: Diagnosis not present

## 2024-02-26 DIAGNOSIS — M069 Rheumatoid arthritis, unspecified: Secondary | ICD-10-CM

## 2024-02-27 ENCOUNTER — Other Ambulatory Visit: Payer: Self-pay | Admitting: Family Medicine

## 2024-02-27 DIAGNOSIS — N186 End stage renal disease: Secondary | ICD-10-CM | POA: Diagnosis not present

## 2024-02-27 DIAGNOSIS — Z992 Dependence on renal dialysis: Secondary | ICD-10-CM | POA: Diagnosis not present

## 2024-02-28 DIAGNOSIS — S72001D Fracture of unspecified part of neck of right femur, subsequent encounter for closed fracture with routine healing: Secondary | ICD-10-CM | POA: Diagnosis not present

## 2024-02-28 DIAGNOSIS — M6281 Muscle weakness (generalized): Secondary | ICD-10-CM | POA: Diagnosis not present

## 2024-02-28 DIAGNOSIS — M810 Age-related osteoporosis without current pathological fracture: Secondary | ICD-10-CM | POA: Diagnosis not present

## 2024-02-29 DIAGNOSIS — N186 End stage renal disease: Secondary | ICD-10-CM | POA: Diagnosis not present

## 2024-02-29 DIAGNOSIS — Z992 Dependence on renal dialysis: Secondary | ICD-10-CM | POA: Diagnosis not present

## 2024-03-03 DIAGNOSIS — N186 End stage renal disease: Secondary | ICD-10-CM | POA: Diagnosis not present

## 2024-03-03 DIAGNOSIS — Z992 Dependence on renal dialysis: Secondary | ICD-10-CM | POA: Diagnosis not present

## 2024-03-05 ENCOUNTER — Encounter

## 2024-03-05 DIAGNOSIS — Z992 Dependence on renal dialysis: Secondary | ICD-10-CM | POA: Diagnosis not present

## 2024-03-05 DIAGNOSIS — N186 End stage renal disease: Secondary | ICD-10-CM | POA: Diagnosis not present

## 2024-03-07 DIAGNOSIS — Z992 Dependence on renal dialysis: Secondary | ICD-10-CM | POA: Diagnosis not present

## 2024-03-07 DIAGNOSIS — N186 End stage renal disease: Secondary | ICD-10-CM | POA: Diagnosis not present

## 2024-03-10 DIAGNOSIS — N186 End stage renal disease: Secondary | ICD-10-CM | POA: Diagnosis not present

## 2024-03-10 DIAGNOSIS — Z992 Dependence on renal dialysis: Secondary | ICD-10-CM | POA: Diagnosis not present

## 2024-03-12 ENCOUNTER — Emergency Department (HOSPITAL_COMMUNITY)

## 2024-03-12 ENCOUNTER — Emergency Department (HOSPITAL_COMMUNITY)
Admission: EM | Admit: 2024-03-12 | Discharge: 2024-03-12 | Disposition: A | Discharge status: Home / Self Care | Attending: Emergency Medicine | Admitting: Emergency Medicine

## 2024-03-12 ENCOUNTER — Encounter: Payer: Self-pay | Admitting: Nephrology

## 2024-03-12 ENCOUNTER — Other Ambulatory Visit: Payer: Self-pay

## 2024-03-12 DIAGNOSIS — I517 Cardiomegaly: Secondary | ICD-10-CM | POA: Diagnosis not present

## 2024-03-12 DIAGNOSIS — R079 Chest pain, unspecified: Secondary | ICD-10-CM | POA: Diagnosis not present

## 2024-03-12 DIAGNOSIS — Z7982 Long term (current) use of aspirin: Secondary | ICD-10-CM | POA: Insufficient documentation

## 2024-03-12 DIAGNOSIS — Z743 Need for continuous supervision: Secondary | ICD-10-CM | POA: Diagnosis not present

## 2024-03-12 DIAGNOSIS — I120 Hypertensive chronic kidney disease with stage 5 chronic kidney disease or end stage renal disease: Secondary | ICD-10-CM | POA: Diagnosis not present

## 2024-03-12 DIAGNOSIS — Z79899 Other long term (current) drug therapy: Secondary | ICD-10-CM | POA: Diagnosis not present

## 2024-03-12 DIAGNOSIS — J40 Bronchitis, not specified as acute or chronic: Secondary | ICD-10-CM | POA: Diagnosis not present

## 2024-03-12 DIAGNOSIS — N186 End stage renal disease: Secondary | ICD-10-CM | POA: Diagnosis not present

## 2024-03-12 DIAGNOSIS — I700 Atherosclerosis of aorta: Secondary | ICD-10-CM | POA: Diagnosis not present

## 2024-03-12 DIAGNOSIS — Z91040 Latex allergy status: Secondary | ICD-10-CM | POA: Insufficient documentation

## 2024-03-12 DIAGNOSIS — Z992 Dependence on renal dialysis: Secondary | ICD-10-CM | POA: Insufficient documentation

## 2024-03-12 DIAGNOSIS — R0789 Other chest pain: Secondary | ICD-10-CM | POA: Insufficient documentation

## 2024-03-12 DIAGNOSIS — I447 Left bundle-branch block, unspecified: Secondary | ICD-10-CM | POA: Diagnosis not present

## 2024-03-12 LAB — BASIC METABOLIC PANEL WITH GFR
Anion gap: 14 (ref 5–15)
BUN: 17 mg/dL (ref 8–23)
CO2: 26 mmol/L (ref 22–32)
Calcium: 8.4 mg/dL — ABNORMAL LOW (ref 8.9–10.3)
Chloride: 96 mmol/L — ABNORMAL LOW (ref 98–111)
Creatinine, Ser: 1.95 mg/dL — ABNORMAL HIGH (ref 0.44–1.00)
GFR, Estimated: 26 mL/min — ABNORMAL LOW (ref >60)
Glucose, Bld: 151 mg/dL — ABNORMAL HIGH (ref 70–99)
Potassium: 3.1 mmol/L — ABNORMAL LOW (ref 3.5–5.1)
Sodium: 136 mmol/L (ref 135–145)

## 2024-03-12 LAB — CBC
HCT: 30.5 % — ABNORMAL LOW (ref 36.0–46.0)
Hemoglobin: 9.9 g/dL — ABNORMAL LOW (ref 12.0–15.0)
MCH: 30.6 pg (ref 26.0–34.0)
MCHC: 32.5 g/dL (ref 30.0–36.0)
MCV: 94.1 fL (ref 80.0–100.0)
Platelets: 208 K/uL (ref 150–400)
RBC: 3.24 MIL/uL — ABNORMAL LOW (ref 3.87–5.11)
RDW: 13.3 % (ref 11.5–15.5)
WBC: 5.6 K/uL (ref 4.0–10.5)
nRBC: 0 % (ref 0.0–0.2)

## 2024-03-12 LAB — TROPONIN I (HIGH SENSITIVITY)
Troponin I (High Sensitivity): 29 ng/L — ABNORMAL HIGH (ref <18)
Troponin I (High Sensitivity): 30 ng/L — ABNORMAL HIGH (ref <18)

## 2024-03-12 MED ORDER — ACETAMINOPHEN 325 MG PO TABS
650.0000 mg | ORAL_TABLET | Freq: Once | ORAL | Status: AC
  Administered 2024-03-12: 650 mg via ORAL
  Filled 2024-03-12: qty 2

## 2024-03-12 NOTE — ED Triage Notes (Signed)
 Pt was at dialysis and began having chest pain . Pt had 58 minutes left on dialysis. Fistula is clapped to left arm. 20 G right AC by [facility].

## 2024-03-12 NOTE — ED Notes (Signed)
 Clapped removed form fistula. Pressure dressing applied. No bleeding noted at this time.

## 2024-03-12 NOTE — ED Provider Notes (Signed)
 Eric[facility] EMERGENCY DEPARTMENT AT [facility] Provider Note   CSN: [id number] Arrival date & time: 03/12/24  1529     Patient presents with: Chest Pain   Erica Crosby is a 78 y.o. female.   Pt is a 78 yo female with pmhx significant for ESRD on HD (Tu, Erica Crosby, Sat), Erica Crosby, Erica Crosby, Erica Crosby, GERD, and RA.  Pt was at dialysis today and developed cp.  She had almost an hour left on her tx.  She had a little sob, but does not any more. Pt's daughter said she had a dialysis port pulled out about a week ago.  She's been having pain in her chest from that since then.  Daughter thinks the pain today is the same pain she's been having.         Prior to Admission medications   Medication Sig Start Date End Date Taking? Authorizing Provider  acetaminophen  (TYLENOL ) 500 MG tablet Take 500-1,000 mg by mouth every 6 (six) hours as needed for moderate pain (pain score 4-6).   Yes [provider]  aspirin  EC 81 MG tablet Take 81 mg by mouth in the morning.   Yes [provider]  atorvastatin  (LIPITOR) 10 MG tablet TAKE 1 TABLET DAILY 01/28/24  Yes Gottschalk, Ashly M, DO  carvedilol  (COREG ) 6.25 MG tablet Take 6.25 mg by mouth 2 (two) times daily. 06/25/23  Yes [provider]  folic acid  (FOLVITE ) 1 MG tablet TAKE 1 TABLET DAILY 02/26/24  Yes Jolinda Potter M, DO  insulin  glargine (LANTUS  SOLOSTAR) 100 UNIT/ML Solostar Pen Inject 5 Units into the skin daily. Patient taking differently: Inject 5 Units into the skin daily as needed (high blood sugar). 11/06/23  Yes Gonfa, Taye T, MD  ofloxacin (OCUFLOX) 0.3 % ophthalmic solution Place 1 drop into the left eye 4 (four) times daily. 02/18/24  Yes [provider]  Omega-3 Fatty Acids (FISH OIL PO) Take 1 capsule by mouth in the morning.   Yes [provider]  pantoprazole  (PROTONIX ) 40 MG tablet TAKE ONE TABLET DAILY 02/28/24  Yes Jolinda Potter M, DO  prednisoLONE acetate (PRED FORTE) 1 % ophthalmic suspension  Place 1 drop into the left eye See admin instructions. Place one drop in the left eye four times daily for 7 days, then twice daily for 7 days, the once daily for 14 days, then stop. 02/18/24  Yes [provider]  RESTASIS 0.05 % ophthalmic emulsion Place 1 drop into the right eye 2 (two) times daily. 01/10/24  Yes [provider]  Continuous Glucose Sensor ([name] 2 SENSOR) MISC USE TO TEST BLOOD SUGAR CONTINUOUSLY. Dx: E11.65. ADVANCED DIABETES SUPPLY VIA [place] 01/14/23   Jolinda Potter M, DO  fluconazole  (DIFLUCAN ) 150 MG tablet Take 1 tablet (150 mg total) by mouth every three (3) days as needed. Patient not taking: Reported on 03/12/2024 02/21/24   Lavell Lye A, FNP    Allergies: Iodinated contrast media, Latex, Shellfish allergy, and Lisinopril    Review of Systems  Cardiovascular:  Positive for chest pain.  All other systems reviewed and are negative.   Updated Vital Signs BP (!) 173/87   Pulse 69   Temp 98.5 F (36.9 C) (Oral)   Resp 18   Ht 5' 8 (1.727 m)   Wt 48.5 kg   SpO2 98%   BMI 16.26 kg/m   Physical Exam Vitals and nursing note reviewed.  Constitutional:      Appearance: She is well-developed.  HENT:  Head: Normocephalic and atraumatic.  Eyes:     Extraocular Movements: Extraocular movements intact.     Pupils: Pupils are equal, round, and reactive to light.  Cardiovascular:     Rate and Rhythm: Normal rate and regular rhythm.     Heart sounds: Normal heart sounds.  Pulmonary:     Effort: Pulmonary effort is normal.     Breath sounds: Normal breath sounds.  Abdominal:     General: Bowel sounds are normal.     Palpations: Abdomen is soft.  Musculoskeletal:        General: Normal range of motion.     Cervical back: Normal range of motion and neck supple.     Comments: LUE AVF + thrill  Skin:    General: Skin is warm.     Capillary Refill: Capillary refill takes less than 2 seconds.  Neurological:     General: No  focal deficit present.     Mental Status: She is alert and oriented to person, place, and time.  Psychiatric:        Mood and Affect: Mood normal.        Behavior: Behavior normal.     (all labs ordered are listed, but only abnormal results are displayed) Labs Reviewed  BASIC METABOLIC PANEL WITH GFR - Abnormal; Notable for the following components:      Result Value   Potassium 3.1 (*)    Chloride 96 (*)    Glucose, Bld 151 (*)    Creatinine, Ser 1.95 (*)    Calcium  8.4 (*)    GFR, Estimated 26 (*)    All other components within normal limits  CBC - Abnormal; Notable for the following components:   RBC 3.24 (*)    Hemoglobin 9.9 (*)    HCT 30.5 (*)    All other components within normal limits  TROPONIN I (HIGH SENSITIVITY) - Abnormal; Notable for the following components:   Troponin I (High Sensitivity) 29 (*)    All other components within normal limits  TROPONIN I (HIGH SENSITIVITY) - Abnormal; Notable for the following components:   Troponin I (High Sensitivity) 30 (*)    All other components within normal limits  CBG MONITORING, ED    EKG: EKG Interpretation Date/Time:  Thursday March 12 2024 16:01:54 EDT Ventricular Rate:  68 PR Interval:  148 QRS Duration:  141 QT Interval:  470 QTC Calculation: 500 R Axis:   47  Text Interpretation: Sinus rhythm Biatrial enlargement IVCD, consider atypical LBBB No significant change since last tracing Confirmed by Dean Clarity [phone]) on 03/12/2024 4:58:14 PM  Radiology: [facility] Chest Port 1 View Result Date: 03/12/2024 CLINICAL DATA:  Chest pain.  Chest pain during dialysis. EXAM: PORTABLE CHEST 1 VIEW COMPARISON:  11/05/2023 FINDINGS: Interval removal of right central venous dialysis catheter. No pneumothorax. Mild cardiac enlargement. No vascular congestion, edema, or consolidation. Emphysematous changes in the lungs with peribronchial thickening and central interstitial pattern likely representing chronic bronchitis. No  pleural effusion or pneumothorax. Mediastinal contours appear intact. Calcification of the aorta. Degenerative changes in the spine and shoulder. Postoperative change in the cervical spine. IMPRESSION: Emphysematous and bronchitic changes in the lungs. Mild cardiac enlargement. No focal consolidation Electronically Signed   By: Elsie Gravely M.D.   On: 03/12/2024 17:29     Procedures   Medications Ordered in the ED  acetaminophen  (TYLENOL ) tablet 650 mg (650 mg Oral Given 03/12/24 1820)  Medical Decision Making Amount and/or Complexity of Data Reviewed Labs: ordered. Radiology: ordered.  Risk OTC drugs.   This patient presents to the ED for concern of cp, this involves an extensive number of treatment options, and is a complaint that carries with it a high risk of complications and morbidity.  The differential diagnosis includes cardiac, pulm, gi, msk   Co morbidities that complicate the patient evaluation  ESRD on HD (Tu, Erica Crosby, Sat), Erica Crosby, Erica Crosby, Erica Crosby, GERD, and RA   Additional history obtained:  Additional history obtained from epic chart review External records from outside source obtained and reviewed including EMS report   Lab Tests:  I Ordered, and personally interpreted labs.  The pertinent results include:  cbc with hgb sl low at 9.9 (stable); bmp with k sl low at 3.1 (just had dialysis), bun 17 and cr 1.95 (stable); 1st trop 29; 2nd trop flat   Imaging Studies ordered:  I ordered imaging studies including cxr  I independently visualized and interpreted imaging which showed  Emphysematous and bronchitic changes in the lungs. Mild cardiac  enlargement. No focal consolidation   I agree with the radiologist interpretation   Cardiac Monitoring:  The patient was maintained on a cardiac monitor.  I personally viewed and interpreted the cardiac monitored which showed an underlying rhythm of: nsr   Medicines ordered and  prescription drug management:  I ordered medication including tylenol   for pain  Reevaluation of the patient after these medicines showed that the patient improved I have reviewed the patients home medicines and have made adjustments as needed   Test Considered:  ct   Problem List / ED Course:  CP:  atypical. Likely due to removal of port. Pt is ready for d/c.  She knows to return if worse.  F/u with pcp.   Reevaluation:  After the interventions noted above, I reevaluated the patient and found that they have :improved   Social Determinants of Health:  Lives at home   Dispostion:  After consideration of the diagnostic results and the patients response to treatment, I feel that the patent would benefit from discharge with outpatient f/u.       Final diagnoses:  Atypical chest pain  ESRD on hemodialysis [facility])    ED Discharge Orders     None          Dean Clarity, MD 03/12/24 [phone]

## 2024-03-14 DIAGNOSIS — N186 End stage renal disease: Secondary | ICD-10-CM | POA: Diagnosis not present

## 2024-03-14 DIAGNOSIS — Z992 Dependence on renal dialysis: Secondary | ICD-10-CM | POA: Diagnosis not present

## 2024-03-17 DIAGNOSIS — Z992 Dependence on renal dialysis: Secondary | ICD-10-CM | POA: Diagnosis not present

## 2024-03-17 DIAGNOSIS — N186 End stage renal disease: Secondary | ICD-10-CM | POA: Diagnosis not present

## 2024-03-25 DIAGNOSIS — E113522 Type 2 diabetes mellitus with proliferative diabetic retinopathy with traction retinal detachment involving the macula, left eye: Secondary | ICD-10-CM | POA: Diagnosis not present

## 2024-03-25 LAB — OPHTHALMOLOGY REPORT-SCANNED

## 2024-03-29 DIAGNOSIS — S72001D Fracture of unspecified part of neck of right femur, subsequent encounter for closed fracture with routine healing: Secondary | ICD-10-CM | POA: Diagnosis not present

## 2024-03-29 DIAGNOSIS — M810 Age-related osteoporosis without current pathological fracture: Secondary | ICD-10-CM | POA: Diagnosis not present

## 2024-03-29 DIAGNOSIS — M6281 Muscle weakness (generalized): Secondary | ICD-10-CM | POA: Diagnosis not present

## 2024-04-23 ENCOUNTER — Other Ambulatory Visit: Payer: Self-pay | Admitting: Family Medicine

## 2024-04-23 DIAGNOSIS — E1169 Type 2 diabetes mellitus with other specified complication: Secondary | ICD-10-CM

## 2024-04-23 NOTE — Telephone Encounter (Unsigned)
 Copied from CRM [id number]. Topic: Clinical - Medication Refill >> Apr 23, 2024  9:35 AM Zebedee SAUNDERS wrote: Medication: Continuous Glucose Sensor ([name] 2 SENSOR) MISC  Has the patient contacted their pharmacy? Yes (Agent: If no, request that the patient contact the pharmacy for the refill. If patient does not wish to contact the pharmacy document the reason why and proceed with request.) (Agent: If yes, when and what did the pharmacy advise?)  This is the patient's preferred pharmacy:   [facility] [employer] company [address] [place] [address]  Ph: [phone]  Is this the correct pharmacy for this prescription? Yes If no, delete pharmacy and type the correct one.   Has the prescription been filled recently? Yes  Is the patient out of the medication? Yes  Has the patient been seen for an appointment in the last year OR does the patient have an upcoming appointment? Yes  Can we respond through MyChart? Yes  Agent: Please be advised that Rx refills may take up to 3 business days. We ask that you follow-up with your pharmacy.

## 2024-04-24 ENCOUNTER — Telehealth: Payer: Self-pay | Admitting: Family Medicine

## 2024-04-24 NOTE — Telephone Encounter (Signed)
 [employer] faxed FMLA forms to be completed and signed.  Form Fee Paid? (Y/N)      No      If NO, form is placed on front office manager desk to hold until payment received. If YES, then form will be placed in the RX/HH Nurse Coordinators box for completion.  Form will not be processed until payment is received  Pt's daughter Ruthy Slicker) will be by on this Monday to pay for forms.

## 2024-04-27 ENCOUNTER — Telehealth: Payer: Self-pay | Admitting: Pharmacist

## 2024-04-27 DIAGNOSIS — Z0279 Encounter for issue of other medical certificate: Secondary | ICD-10-CM

## 2024-04-27 DIAGNOSIS — E1140 Type 2 diabetes mellitus with diabetic neuropathy, unspecified: Secondary | ICD-10-CM

## 2024-04-27 MED ORDER — FREESTYLE LIBRE 3 READER DEVI: 1 refills | Status: DC

## 2024-04-27 MED ORDER — FREESTYLE LIBRE 3 PLUS SENSOR MISC: 5 refills | Status: DC

## 2024-04-27 NOTE — Telephone Encounter (Signed)
 Libre 2 no longer available Changed to [place] 3 plus CGM sensors and reader Rxs sent to [facility] per patient request  Mliss Tarry Griffin, PharmD, BCACP, CPP Clinical Pharmacist, [facility]

## 2024-04-27 NOTE — Telephone Encounter (Signed)
 Arlo Slicker dropped off [employer] forms to be completed and signed.  Form Fee Paid? (Y/N)      y      If NO, form is placed on front office manager desk to hold until payment received. If YES, then form will be placed in the RX/HH Nurse Coordinators box for completion.  Form will not be processed until payment is received

## 2024-04-27 NOTE — Telephone Encounter (Signed)
 Mliss is this one you can help with? Did I find the right pharmacy and does she need to be change to a [place] 3?

## 2024-04-30 NOTE — Telephone Encounter (Signed)
 PCP completed and signed FMLA forms. They have been faxed to [employer] at fax number [phone]. Patient has been contacted and informed they are complete. Copy at front desk.

## 2024-05-01 ENCOUNTER — Other Ambulatory Visit (HOSPITAL_COMMUNITY): Payer: Self-pay

## 2024-05-01 ENCOUNTER — Telehealth: Payer: Self-pay

## 2024-05-01 ENCOUNTER — Encounter: Payer: Self-pay | Admitting: Nephrology

## 2024-05-01 ENCOUNTER — Telehealth: Payer: Self-pay | Admitting: Family Medicine

## 2024-05-01 NOTE — Telephone Encounter (Signed)
 Copied from CRM #[id number]. Topic: Clinical - Prescription Issue >> May 01, 2024  1:03 PM Larissa RAMAN wrote: Reason for CRM: Patient states pharmacy has not received prescription for: Continuous Glucose Sensor ([name] 3 PLUS SENSOR) MISC and Continuous Glucose Receiver ([name] 3 READER) DEVI  [facility] - [place], [place] - [address] [place] [address] Phone: [phone] Fax: [phone] Hours: Not open 24 hours

## 2024-05-01 NOTE — Telephone Encounter (Signed)
 PA request has been Submitted. New Encounter has been or will be created for follow up. For additional info see Pharmacy Prior Auth telephone encounter from 05/01/2024.

## 2024-05-01 NOTE — Telephone Encounter (Signed)
 Pharmacy Patient Advocate Encounter   Received notification from Physician's Office that prior authorization for [name] 3 Plus Sensor is required/requested.   Insurance verification completed.   The patient is insured through [facility].   Per test claim: PA required; PA submitted to above mentioned insurance via Latent Key/confirmation #/EOC [id number] Status is pending

## 2024-05-04 NOTE — Telephone Encounter (Unsigned)
 Copied from CRM #[id number]. Topic: Clinical - Prescription Issue >> May 01, 2024  1:03 PM Larissa RAMAN wrote: Reason for CRM: Patient states pharmacy has not received prescription for: Continuous Glucose Sensor ([name] 3 PLUS SENSOR) MISC and Continuous Glucose Receiver ([name] 3 READER) DEVI  [facility] - [place], [place] - [address] [place] [address] Phone: [phone] Fax: [phone] Hours: Not open 24 hours >> May 04, 2024 11:35 AM Miquel SAILOR wrote: Pt calling update for Libre 3 Pa . Need call back. [phone]

## 2024-05-07 ENCOUNTER — Other Ambulatory Visit (HOSPITAL_COMMUNITY): Payer: Self-pay

## 2024-05-07 NOTE — Telephone Encounter (Signed)
 Pharmacy Patient Advocate Encounter  Received notification from [employer] that Prior Authorization for [name] 3 Plus Sensor has been APPROVED from 05/01/2024 to 06/17/2025. Ran test claim, Copay is $0. This test claim was processed through [facility]- copay amounts may vary at other pharmacies due to pharmacy/plan contracts, or as the patient moves through the different stages of their insurance plan.   PA #/Case ID/Reference #: [id number]

## 2024-05-12 ENCOUNTER — Telehealth: Payer: Self-pay

## 2024-05-12 DIAGNOSIS — E1140 Type 2 diabetes mellitus with diabetic neuropathy, unspecified: Secondary | ICD-10-CM

## 2024-05-12 NOTE — Telephone Encounter (Signed)
 Copied from CRM #[id number]. Topic: Clinical - Prescription Issue >> May 12, 2024  3:15 PM Myrick T wrote: Reason for CRM: patients daughter Arlo Slicker called stated the [employer] is needing the physicians notes on why patient need the [facility] 3 plus sensor faxed to [phone]. Please f/u with daughter.

## 2024-05-13 MED ORDER — FREESTYLE LIBRE 3 PLUS SENSOR MISC: 5 refills | Status: AC

## 2024-05-13 MED ORDER — FREESTYLE LIBRE 3 READER DEVI: 1 refills | Status: AC

## 2024-05-13 NOTE — Telephone Encounter (Signed)
Thanks Lynden Ang!

## 2024-05-13 NOTE — Telephone Encounter (Signed)
 Called Nedra back Script was sent to [facility] they are part of [employer], not sure why they are needing notes since a PA was done. They would like script sent to [facility] pharmacy since all of her other medicines are there and they have already talked to the pharmacists about this. Sent scripts with the PA # in the notes to the pharmacy

## 2024-05-13 NOTE — Addendum Note (Signed)
 Addended by: Chauncey Sciulli D on: 05/13/2024 09:41 AM   Modules accepted: Orders

## 2024-05-13 NOTE — Telephone Encounter (Signed)
 Any idea what this is?  I suspect just a statement saying she is on insulin ? Or do we need to fill out a special form?

## 2024-05-18 ENCOUNTER — Telehealth: Payer: Self-pay | Admitting: Family Medicine

## 2024-05-18 NOTE — Telephone Encounter (Signed)
 Copied from CRM #[id number]. Topic: Referral - Question >> May 18, 2024  9:47 AM DeAngela L wrote: Reason for CRM: The patients daughter calling for arthritis concerns the patient can really use her hands to open stuff and she is concerned about the swelling and pain in her joints for Rheumatology  So she would like to ask for a referral to the   [facility] Address: [address], [place], [place] [address] Phone: [phone] Fax num [phone]  Pt daughter num [phone]

## 2024-05-18 NOTE — Telephone Encounter (Signed)
 I'm glad to place referral but I can almost guarantee the rheumatologist won't see her without recent office note/ documentation and some level of laboratory workup.  Please offer patient an appointment so we can set her up for success for that referral.

## 2024-05-20 NOTE — Telephone Encounter (Signed)
 Left detailed message making daughter aware that per PCP patient needs an appointment with PCP before she can refer patient out, to make sure insurance will pay for referral visits.

## 2024-05-22 ENCOUNTER — Encounter: Payer: Self-pay | Admitting: Physician Assistant

## 2024-05-22 ENCOUNTER — Ambulatory Visit

## 2024-05-22 ENCOUNTER — Ambulatory Visit: Admitting: Physician Assistant

## 2024-05-22 NOTE — Progress Notes (Incomplete)
 Assessment & Plan   Erica Crosby is a very pleasant 78 y.o. year old RH female with a history of hypertension, hyperlipidemia, ESRD on HD (Tu, Th, Sat), history of PUD, GERD, RA, DM2 with polyneuropathy, hypothyroidism, GERD, anemia disease, seen today for evaluation of memory loss. MoCA today is   The patient is accompanied by her daughter***  who supplements  the history.   Discussed the use of AI scribe software for clinical note transcription with the patient, who gave verbal consent to proceed.  History of Present Illness        How long did patient have memory difficulties?  For about.  Patient reports some difficulty remembering new information, recent conversations, names. repeats oneself?  Endorsed Disoriented when walking into a room? Denies ***  Leaving objects in unusual places?  Denies.   Wandering behavior? Denies.   Any personality changes, or depression, anxiety?  Has been more irritable*** Hallucinations or paranoia? Denies.   Seizures? Denies.    Any sleep changes?  Sleeps well *** Does not sleep well. **  frequent nightmares or dream reenactment, other REM behavior or sleepwalking   Sleep apnea? Denies.   Any hygiene concerns?  Denies.   Independent of bathing and dressing? Endorsed  Does the patient need help with medications?  is in charge *** Who is in charge of the finances?  is in charge   *** Any changes in appetite?   Denies. ***   Patient have trouble swallowing?  Denies.   Does the patient cook? No*** yes, denies forgetting common recipes or kitchen accidents *** Any headaches?  Denies.   Chronic pain? Denies.   Ambulates with difficulty? Denies. ***  Needs a cane*** Needs a walker *** to ambulate for stability.   Recent falls or head injuries? Denies.     Vision changes?  Denies any new issues.  Has a history of*** Any strokelike symptoms? Denies.   Any tremors? Denies. *** Any anosmia? Denies.   Any incontinence of urine? Denies.   Any  bowel dysfunction? Denies.      Patient lives with ***  History of heavy alcohol intake? Denies.   History of heavy tobacco use? Denies.   Family history of dementia?   *** with dementia  Does patient drive? No longer drives  *** yes, denies getting lost.***      Results    Allergies  Allergen Reactions   Iodinated Contrast Media Other (See Comments)    Unknown    Latex Other (See Comments)    Unknown    Shellfish Allergy Other (See Comments)    Unknown    Lisinopril Swelling    Lips     Current Outpatient Medications  Medication Instructions   acetaminophen  (TYLENOL ) 500-1,000 mg, Every 6 hours PRN   aspirin  EC 81 mg, Every morning   atorvastatin  (LIPITOR) 10 mg, Oral, Daily   carvedilol  (COREG ) 6.25 mg, 2 times daily   Continuous Glucose Receiver ([name] 3 READER) DEVI with compatible [name] 3 sensor to monitor glucose continuously. DX E11.65   Continuous Glucose Sensor ([name] 3 PLUS SENSOR) MISC Change sensor every 15 days. DX: E11.65   fluconazole  (DIFLUCAN ) 150 mg, Oral, Every 3 DAYS PRN   folic acid  (FOLVITE ) 1 mg, Oral, Daily   Lantus  SoloStar 5 Units, Subcutaneous, Daily   ofloxacin (OCUFLOX) 0.3 % ophthalmic solution 1 drop, 4 times daily   Omega-3 Fatty Acids (FISH OIL PO) 1 capsule, Every  morning   pantoprazole  (PROTONIX ) 40 mg, Oral, Daily   prednisoLONE acetate (PRED FORTE) 1 % ophthalmic suspension 1 drop, See admin instructions   RESTASIS 0.05 % ophthalmic emulsion 1 drop, 2 times daily     VITALS:  There were no vitals filed for this visit.   Neurological Exam      No data to display             08/14/2017    3:03 PM 08/22/2016    2:56 PM  MMSE - Mini Mental State Exam  Not completed: --   Orientation to time 5 4   Orientation to Place 5 5   Registration 3 3   Attention/ Calculation 0 0   Attention/Calculation-comments not attempted   Recall 1 3   Language- name 2 objects 2 2   Language- repeat 1 1  Language-  follow 3 step command 3 3   Language- read & follow direction 1 1   Write a sentence 1 1   Copy design 1 1   Total score 23 24      Data saved with a previous flowsheet row definition       Orientation:  Alert and oriented to person, place and not to time***. No aphasia or dysarthria. Fund of knowledge is appropriate. Recent and remote memory impaired.  Attention and concentration are reduced***.  Able to name objects and repeat phrases. *** Delayed recall  /5 .*** Cranial nerves: There is good facial symmetry. Extraocular muscles are intact and visual fields are full to confrontational testing. Speech is fluent and clear, tangential. No tongue deviation. Hearing is intact to conversational tone.*** Tone: Tone is good throughout. Sensation: Sensation is intact to light touch.  Vibration is intact at the bilateral big toe.  Coordination: The patient has no difficulty with RAM's or FNF bilaterally. Normal finger to nose  Motor: Strength is 5/5 in the bilateral upper and lower extremities. There is no pronator drift. There are no fasciculations noted. DTR's: Deep tendon reflexes are 2/4 bilaterally. Gait and Station: The patient is able to ambulate without difficulty. Gait is cautious and narrow. Stride length is normal. ***      Thank you for allowing us  the opportunity to participate in the care of this nice patient. Please do not hesitate to contact us  for any questions or concerns.   Total time spent on today's visit was *** minutes dedicated to this patient today, preparing to see patient, examining the patient, ordering tests and/or medications and counseling the patient, documenting clinical information in the EHR or other health record, independently interpreting results and communicating results to the patient/family, discussing treatment and goals, answering patient's questions and coordinating care.  Cc:  Jolinda Norene HERO, DO  Camie Sevin 05/22/2024 6:19 AM

## 2024-05-25 ENCOUNTER — Ambulatory Visit: Payer: Self-pay

## 2024-05-25 VITALS — BP 127/54 | HR 69 | Ht 68.0 in | Wt 107.0 lb

## 2024-05-25 DIAGNOSIS — Z0000 Encounter for general adult medical examination without abnormal findings: Secondary | ICD-10-CM

## 2024-05-25 NOTE — Patient Instructions (Signed)
 Erica Crosby,  Thank you for taking the time for your Medicare Wellness Visit. I appreciate your continued commitment to your health goals. Please review the care plan we discussed, and feel free to reach out if I can assist you further.  Please note that Annual Wellness Visits do not include a physical exam. Some assessments may be limited, especially if the visit was conducted virtually. If needed, we may recommend an in-person follow-up with your provider.  Ongoing Care Seeing your primary care provider every 3 to 6 months helps us  monitor your health and provide consistent, personalized care.   Referrals If a referral was made during today's visit and you haven't received any updates within two weeks, please contact the referred provider directly to check on the status.  Recommended Screenings:  Health Maintenance  Topic Date Due   Osteoporosis screening with Bone Density Scan  12/01/2021   Medicare Annual Wellness Visit  11/16/2023   Complete foot exam   01/16/2024   Flu Shot  01/17/2024   COVID-19 Vaccine (4 - 2025-26 season) 02/17/2024   Hemoglobin A1C  05/03/2024   Eye exam for diabetics  03/25/2025   Colon Cancer Screening  08/14/2026   DTaP/Tdap/Td vaccine (3 - Td or Tdap) 05/19/2033   Pneumococcal Vaccine for age over 97  Completed   Hepatitis C Screening  Completed   Zoster (Shingles) Vaccine  Completed   Meningitis B Vaccine  Aged Out   Breast Cancer Screening  Discontinued       05/25/2024   11:12 AM  Advanced Directives  Does Patient Have a Medical Advance Directive? No  Would patient like information on creating a medical advance directive? No - Patient declined    Vision: Annual vision screenings are recommended for early detection of glaucoma, cataracts, and diabetic retinopathy. These exams can also reveal signs of chronic conditions such as diabetes and high blood pressure.  Dental: Annual dental screenings help detect early signs of oral cancer, gum  disease, and other conditions linked to overall health, including heart disease and diabetes.  Please see the attached documents for additional preventive care recommendations.

## 2024-05-25 NOTE — Progress Notes (Signed)
 Chief Complaint  Patient presents with   Medicare Wellness     Subjective:   Erica Crosby is a 78 y.o. female who presents for a Medicare Annual Wellness Visit.  Visit info / Clinical Intake: Medicare Wellness Visit Type:: Subsequent Annual Wellness Visit Persons participating in visit and providing information:: patient Medicare Wellness Visit Mode:: Telephone If telephone:: video declined Since this visit was completed virtually, some vitals may be partially provided or unavailable. Missing vitals are due to the limitations of the virtual format.: Documented vitals are patient reported If Telephone or Video please confirm:: I connected with patient using audio/video enable telemedicine. I verified patient identity with two identifiers, discussed telehealth limitations, and patient agreed to proceed. Patient Location:: home Provider Location:: home office Interpreter Needed?: No Pre-visit prep was completed: yes AWV questionnaire completed by patient prior to visit?: no Living arrangements:: with family/others Patient's Overall Health Status Rating: very good Typical amount of pain: some Does pain affect daily life?: no (per pt have sinus and have headaches due to her fall) Are you currently prescribed opioids?: no  Dietary Habits and Nutritional Risks How many meals a day?: 3 Eats fruit and vegetables daily?: yes Most meals are obtained by: preparing own meals In the last 2 weeks, have you had any of the following?: none Diabetic:: (!) yes Any non-healing wounds?: no How often do you check your BS?: continuous glucose monitor Would you like to be referred to a Nutritionist or for Diabetic Management? : no  Functional Status Activities of Daily Living (to include ambulation/medication): (!) Needs Assist Feeding: Independent Dressing/Grooming: Independent Bathing: Independent with device- listed below (pt's daughter helps) Toileting: Independent Transfer: Independent  with device- listed below Ambulation: Independent with device- listed below Home Assistive Devices/Equipment: Walker (specify Type) Medication Administration: Needs assistance (comment) (pt's daughter helps) Home Management (perform basic housework or laundry): Needs assistance (comment) (pt's daughter helps) Manage your own finances?: (!) no (pt's daughter helps) Primary transportation is: facility / other (pt gets a ride through [employer]) Concerns about vision?: (!) yes (last ov 04/2024) Concerns about hearing?: no  Fall Screening Falls in the past year?: 1 Number of falls in past year: 1 Was there an injury with Fall?: 1 Fall Risk Category Calculator: 3 Patient Fall Risk Level: High Fall Risk  Fall Risk Patient at Risk for Falls Due to: Impaired balance/gait; Impaired mobility Fall risk Follow up: Falls evaluation completed; Education provided  Home and Transportation Safety: All rugs have non-skid backing?: yes All stairs or steps have railings?: yes Grab bars in the bathtub or shower?: (!) no Have non-skid surface in bathtub or shower?: yes Good home lighting?: yes Regular seat belt use?: yes Hospital stays in the last year:: (!) yes How many hospital stays:: 3 (pt don't remember) Reason: due to pt fall when she passed out  Cognitive Assessment Difficulty concentrating, remembering, or making decisions? : yes Will 6CIT or Mini Cog be Completed: yes What year is it?: 4 points What month is it?: 0 points Give patient an address phrase to remember (5 components): [address] [place] About what time is it?: 0 points Count backwards from 20 to 1: 4 points Say the months of the year in reverse: 4 points Repeat the address phrase from earlier: 10 points 6 CIT Score: 22 points  Advance Directives (For Healthcare) Does Patient Have a Medical Advance Directive?: No Does patient want to make changes to medical advance directive?: No - Patient declined Type of Advance  Directive:  Healthcare Power of [employer] of Healthcare Power of Attorney in Chart?: No - copy requested Would patient like information on creating a medical advance directive?: No - Patient declined  Reviewed/Updated  Reviewed/Updated: Reviewed All (Medical, Surgical, Family, Medications, Allergies, Care Teams, Patient Goals); Medical History; Surgical History; Family History; Medications; Allergies; Care Teams; Patient Goals    Allergies (verified) Iodinated contrast media, Latex, Shellfish allergy, and Lisinopril   Current Medications (verified) Outpatient Encounter Medications as of 05/25/2024  Medication Sig   acetaminophen  (TYLENOL ) 500 MG tablet Take 500-1,000 mg by mouth every 6 (six) hours as needed for moderate pain (pain score 4-6).   aspirin  EC 81 MG tablet Take 81 mg by mouth in the morning.   atorvastatin  (LIPITOR) 10 MG tablet TAKE 1 TABLET DAILY   carvedilol  (COREG ) 6.25 MG tablet Take 6.25 mg by mouth 2 (two) times daily.   Continuous Glucose Receiver ([name] 3 READER) DEVI with compatible [name] 3 sensor to monitor glucose continuously. DX E11.65   Continuous Glucose Sensor ([name] 3 PLUS SENSOR) MISC Change sensor every 15 days. DX: E11.65   folic acid  (FOLVITE ) 1 MG tablet TAKE 1 TABLET DAILY   insulin  glargine (LANTUS  SOLOSTAR) 100 UNIT/ML Solostar Pen Inject 5 Units into the skin daily. (Patient taking differently: Inject 5 Units into the skin daily as needed (high blood sugar).)   ofloxacin (OCUFLOX) 0.3 % ophthalmic solution Place 1 drop into the left eye 4 (four) times daily.   Omega-3 Fatty Acids (FISH OIL PO) Take 1 capsule by mouth in the morning.   pantoprazole  (PROTONIX ) 40 MG tablet TAKE ONE TABLET DAILY   prednisoLONE acetate (PRED FORTE) 1 % ophthalmic suspension Place 1 drop into the left eye See admin instructions. Place one drop in the left eye four times daily for 7 days, then twice daily for 7 days, the once daily for 14  days, then stop.   RESTASIS 0.05 % ophthalmic emulsion Place 1 drop into the right eye 2 (two) times daily.   fluconazole  (DIFLUCAN ) 150 MG tablet Take 1 tablet (150 mg total) by mouth every three (3) days as needed. (Patient not taking: Reported on 05/25/2024)   No facility-administered encounter medications on file as of 05/25/2024.    History: Past Medical History:  Diagnosis Date   Anemia    Arthritis    Cataract    Coronary artery disease    Mild plaque 2012   Diabetes mellitus    ESRD on hemodialysis (HCC)    GERD (gastroesophageal reflux disease)    Hyperlipidemia    Hypertension    Neuropathy    Peptic ulcer    Rheumatoid arthritis (HCC)    Syncope 02/10/2012   Past Surgical History:  Procedure Laterality Date   ABSCESS DRAINAGE     AV FISTULA PLACEMENT Left 09/04/2023   Procedure: ARTERIOVENOUS (AV) FISTULA CREATION, BRACHIOCEPHALIC;  Surgeon: Serene Gaile ORN, MD;  Location: [facility] OR;  Service: Vascular;  Laterality: Left;   BASCILIC VEIN TRANSPOSITION Left 12/27/2023   Procedure: TRANSPOSITION, VEIN, BASILIC;  Surgeon: Serene Gaile ORN, MD;  Location: [facility] OR;  Service: Vascular;  Laterality: Left;   CARPAL TUNNEL RELEASE Left    CHOLECYSTECTOMY     HIP FRACTURE SURGERY Right    INSERTION OF DIALYSIS CATHETER Right 09/04/2023   Procedure: INSERTION OF DIALYSIS CATHETER USING 23CM PALINDROME CATHETER;  Surgeon: Serene Gaile ORN, MD;  Location: [facility] OR;  Service: Vascular;  Laterality: Right;   INTRAMEDULLARY (IM) NAIL INTERTROCHANTERIC Right 11/01/2023  Procedure: FIXATION, FRACTURE, INTERTROCHANTERIC, WITH INTRAMEDULLARY ROD;  Surgeon: Kendal Franky SQUIBB, MD;  Location: [facility] OR;  Service: Orthopedics;  Laterality: Right;   KNEE SURGERY Right    NECK SURGERY     x 2   SHOULDER SURGERY Left    TEE WITHOUT CARDIOVERSION N/A 03/12/2022   Procedure: TRANSESOPHAGEAL ECHOCARDIOGRAM (TEE);  Surgeon: Francyne Headland, MD;  [facility] ENDOSCOPY;  Service: Cardiovascular;  Laterality:  N/A;   Family History  Problem Relation Age of Onset   Heart disease Sister        Congeital (died age 9) with enlarged heart   CAD Sister    Diabetes Mother    Heart disease Mother        Later onset heart disease    Dementia Mother    Alcohol abuse Father    Liver disease Father    CAD Sister 31       CABG   Early death Sister    Diabetes Brother    Kidney disease Brother        related to DM   Diabetes Brother    Social History   Occupational History   Occupation: retired  Tobacco Use   Smoking status: Former    Current packs/day: 0.00    Types: Cigarettes, E-cigarettes    Start date: 02/03/1988    Quit date: 02/02/2013    Years since quitting: 11.3   Smokeless tobacco: Never  Vaping Use   Vaping status: Former   Quit date: 11/05/2023  Substance and Sexual Activity   Alcohol use: No   Drug use: No   Sexual activity: Not Currently    Birth control/protection: Post-menopausal   Tobacco Counseling Counseling given: Yes  SDOH Screenings   Food Insecurity: No Food Insecurity (05/25/2024)  Housing: Unknown (05/25/2024)  Transportation Needs: No Transportation Needs (05/25/2024)  Utilities: Not At Risk (05/25/2024)  Alcohol Screen: Low Risk  (11/16/2022)  Depression (PHQ2-9): Low Risk  (05/25/2024)  Financial Resource Strain: Low Risk  (11/16/2022)  Physical Activity: Inactive (05/25/2024)  Social Connections: Socially Isolated (05/25/2024)  Stress: No Stress Concern Present (05/25/2024)  Tobacco Use: Medium Risk (05/25/2024)  Health Literacy: Inadequate Health Literacy (05/25/2024)   See flowsheets for full screening details  Depression Screen PHQ 2 & 9 Depression Scale- Over the past 2 weeks, how often have you been bothered by any of the following problems? Little interest or pleasure in doing things: 2 Feeling down, depressed, or hopeless (PHQ Adolescent also includes...irritable): 0 PHQ-2 Total Score: 2 Trouble falling or staying asleep, or sleeping too much:  0 Feeling tired or having little energy: 2 Poor appetite or overeating (PHQ Adolescent also includes...weight loss): 0 Feeling bad about yourself - or that you are a failure or have let yourself or your family down: 0 Trouble concentrating on things, such as reading the newspaper or watching television (PHQ Adolescent also includes...like school work): 0 Moving or speaking so slowly that other people could have noticed. Or the opposite - being so fidgety or restless that you have been moving around a lot more than usual: 0 Thoughts that you would be better off dead, or of hurting yourself in some way: 0 PHQ-9 Total Score: 4 If you checked off any problems, how difficult have these problems made it for you to do your work, take care of things at home, or get along with other people?: Not difficult at all  Depression Treatment Depression Interventions/Treatment : Counseling     Goals Addressed  This Visit's Progress    DIET - INCREASE WATER INTAKE   On track    Try to drink 6-8 glasses of water daily.             Objective:    Today's Vitals   05/25/24 1108  BP: (!) 127/54  Pulse: 69  Weight: 107 lb (48.5 kg)  Height: 5' 8 (1.727 m)   Body mass index is 16.27 kg/m.  Hearing/Vision screen No results found. Immunizations and Health Maintenance Health Maintenance  Topic Date Due   Bone Density Scan  12/01/2021   FOOT EXAM  01/16/2024   Influenza Vaccine  01/17/2024   COVID-19 Vaccine (4 - 2025-26 season) 02/17/2024   HEMOGLOBIN A1C  05/03/2024   OPHTHALMOLOGY EXAM  03/25/2025   Medicare Annual Wellness (AWV)  05/25/2025   Colonoscopy  08/14/2026   DTaP/Tdap/Td (3 - Td or Tdap) 05/19/2033   Pneumococcal Vaccine: 50+ Years  Completed   Hepatitis C Screening  Completed   Zoster Vaccines- Shingrix  Completed   Meningococcal B Vaccine  Aged Out   Mammogram  Discontinued        Assessment/Plan:  This is a routine wellness examination for  Camryn.  Patient Care Team: Jolinda Norene HERO, DO as PCP - General (Family Medicine) Lavona Agent, MD as PCP - Cardiology (Cardiology) Leni Marjory MATSU, MD as Consulting Physician (Rheumatology) Lenis Ethelle ORN, MD as Consulting Physician (Endocrinology) Prescilla Beams, MD as Consulting Physician (Nephrology) Billee Mliss BIRCH, RPH-CPP as Pharmacist (Family Medicine)  I have personally reviewed and noted the following in the patient's chart:   Medical and social history Use of alcohol, tobacco or illicit drugs  Current medications and supplements including opioid prescriptions. Functional ability and status Nutritional status Physical activity Advanced directives List of other physicians Hospitalizations, surgeries, and ER visits in previous 12 months Vitals Screenings to include cognitive, depression, and falls Referrals and appointments  No orders of the defined types were placed in this encounter.  In addition, I have reviewed and discussed with patient certain preventive protocols, quality metrics, and best practice recommendations. A written personalized care plan for preventive services as well as general preventive health recommendations were provided to patient.   Ozie Ned, CMA   05/25/2024   Return in 1 year (on 05/25/2025).  After Visit Summary: (MyChart) Due to this being a telephonic visit, the after visit summary with patients personalized plan was offered to patient via MyChart   Nurse Notes: PCP F/U: pt is aware and due the following: Dexa/foot exam, flu, covid, A1C--

## 2024-05-26 ENCOUNTER — Telehealth: Payer: Self-pay

## 2024-05-26 NOTE — Telephone Encounter (Signed)
-----   Message from [place] sent at 05/26/2024  1:12 PM EST ----- Looks like our office left a detailed message with her on the first trying to get her scheduled appointment.  I would highly recommend she consider doing so if she still having issues. ----- Message ----- From: Debby Simper, CMA Sent: 05/25/2024  11:48 AM EST To: Norene CHRISTELLA Fielding, DO  During awv today pt c/o still having headaches from her fall/passed out and hit her head during dialysis tx

## 2024-05-26 NOTE — Telephone Encounter (Signed)
 Spoke w/pt's daughter, Arlo. Pt has an appt on 07/04/23 at 2pm.  Did you you need to see her sooner?

## 2024-05-27 ENCOUNTER — Other Ambulatory Visit: Payer: Self-pay | Admitting: Family Medicine

## 2024-05-27 DIAGNOSIS — M069 Rheumatoid arthritis, unspecified: Secondary | ICD-10-CM

## 2024-05-28 ENCOUNTER — Telehealth: Payer: Self-pay

## 2024-05-28 NOTE — Telephone Encounter (Signed)
 Tried calling pt today, LVM msg for pt's daughter, Arlo to return call re: appt for pt, if needed before 07/04/2023 depending on pt's symptoms (headache) per pcp.

## 2024-05-28 NOTE — Telephone Encounter (Signed)
-----   Message from [place], [place] sent at 05/26/2024  1:12 PM EST ----- Looks like our office left a detailed message with her on the first trying to get her scheduled appointment.  I would highly recommend she consider doing so if she still having issues. ----- Message ----- From: Debby Simper, CMA Sent: 05/25/2024  11:48 AM EST To: Norene CHRISTELLA Fielding, DO  During awv today pt c/o still having headaches from her fall/passed out and hit her head during dialysis tx

## 2024-06-06 ENCOUNTER — Other Ambulatory Visit: Payer: Self-pay | Admitting: Family Medicine

## 2024-06-30 HISTORY — PX: RETINAL DETACHMENT SURGERY: SHX105

## 2024-07-03 ENCOUNTER — Ambulatory Visit

## 2024-07-03 ENCOUNTER — Encounter: Payer: Self-pay | Admitting: Family Medicine

## 2024-07-03 ENCOUNTER — Ambulatory Visit: Admitting: Family Medicine

## 2024-07-03 ENCOUNTER — Ambulatory Visit (INDEPENDENT_AMBULATORY_CARE_PROVIDER_SITE_OTHER): Admitting: Family Medicine

## 2024-07-03 VITALS — BP 159/64 | HR 67 | Temp 97.7°F | Ht 68.0 in | Wt 114.1 lb

## 2024-07-03 DIAGNOSIS — Z794 Long term (current) use of insulin: Secondary | ICD-10-CM

## 2024-07-03 DIAGNOSIS — E1140 Type 2 diabetes mellitus with diabetic neuropathy, unspecified: Secondary | ICD-10-CM

## 2024-07-03 DIAGNOSIS — M20092 Other deformity of left finger(s): Secondary | ICD-10-CM | POA: Diagnosis not present

## 2024-07-03 DIAGNOSIS — M0579 Rheumatoid arthritis with rheumatoid factor of multiple sites without organ or systems involvement: Secondary | ICD-10-CM

## 2024-07-03 MED ORDER — PREDNISONE 2.5 MG PO TABS: 2.5000 mg | ORAL_TABLET | Freq: Every day | ORAL | 1 refills | Status: AC

## 2024-07-03 MED ORDER — PREDNISONE 10 MG PO TABS: ORAL_TABLET | ORAL | 0 refills | Status: AC

## 2024-07-03 NOTE — Progress Notes (Addendum)
 "  Subjective: CC: Rheumatoid arthritis PCP: Jolinda Norene HERO, DO HPI:Erica Crosby is a 79 y.o. female presenting to clinic today for:  Patient is brought to the office by her daughter.  She notes that rheumatoid arthritis has been grossly uncontrolled.  She was previously under the care of Dr. Leni in [place] [facility] but notes that they have since retired and she never established with anyone new.  Previously treated with prednisone  and Enbrel at 1 point.  Has been off all medications for at least a couple of years now.  She reports polyarthralgia in her hands, wrists and legs.  Unable to take NSAIDs secondary to chronic kidney disease.  Additionally, has a deformity of the left finger which has looked out of joint for several months if not almost a year now.  She has tenderness when it is bumped.    Had eye surgery this week and has follow-up with the eye doctor soon.   ROS: Per HPI  Allergies[1] Past Medical History:  Diagnosis Date   Anemia    Arthritis    Cataract    Coronary artery disease    Mild plaque 2012   Diabetes mellitus    ESRD on hemodialysis (HCC)    GERD (gastroesophageal reflux disease)    Hyperlipidemia    Hypertension    Neuropathy    Peptic ulcer    Rheumatoid arthritis (HCC)    Syncope 02/10/2012   Current Medications[2] Social History   Socioeconomic History   Marital status: Widowed    Spouse name: Not on file   Number of children: 3   Years of education: 82   Highest education level: High school graduate  Occupational History   Occupation: retired  Tobacco Use   Smoking status: Former    Current packs/day: 0.00    Types: Cigarettes, E-cigarettes    Start date: 02/03/1988    Quit date: 02/02/2013    Years since quitting: 11.4   Smokeless tobacco: Never  Vaping Use   Vaping status: Former   Quit date: 11/05/2023  Substance and Sexual Activity   Alcohol use: No   Drug use: No   Sexual activity: Not Currently    Birth control/protection:  Post-menopausal  Other Topics Concern   Not on file  Social History Narrative   Daughter stays with her.    Son comes daily when daughter is working   Social Drivers of Health   Tobacco Use: Medium Risk (05/25/2024)   Patient History    Smoking Tobacco Use: Former    Smokeless Tobacco Use: Never    Passive Exposure: Not on file  Financial Resource Strain: Low Risk (11/16/2022)   Overall Financial Resource Strain (CARDIA)    Difficulty of Paying Living Expenses: Not hard at all  Food Insecurity: No Food Insecurity (05/25/2024)   Epic    Worried About Programme Researcher, Broadcasting/film/video in the Last Year: Never true    Ran Out of Food in the Last Year: Never true  Transportation Needs: No Transportation Needs (05/25/2024)   Epic    Lack of Transportation (Medical): No    Lack of Transportation (Non-Medical): No  Physical Activity: Inactive (05/25/2024)   Exercise Vital Sign    Days of Exercise per Week: 0 days    Minutes of Exercise per Session: 0 min  Stress: No Stress Concern Present (05/25/2024)   [employer] of Occupational Health - Occupational Stress Questionnaire    Feeling of Stress: Only a little  Social Connections: Socially Isolated (05/25/2024)  Social Advertising Account Executive    Frequency of Communication with Friends and Family: Twice a week    Frequency of Social Gatherings with Friends and Family: Once a week    Attends Religious Services: Never    Database Administrator or Organizations: No    Attends Banker Meetings: Never    Marital Status: Widowed  Intimate Partner Violence: Not At Risk (05/25/2024)   Epic    Fear of Current or Ex-Partner: No    Emotionally Abused: No    Physically Abused: No    Sexually Abused: No  Depression (PHQ2-9): Low Risk (05/25/2024)   Depression (PHQ2-9)    PHQ-2 Score: 4  Alcohol Screen: Low Risk (11/16/2022)   Alcohol Screen    Last Alcohol Screening Score (AUDIT): 0  Housing: Unknown (05/25/2024)   Epic    Unable to  Pay for Housing in the Last Year: No    Number of Times Moved in the Last Year: Not on file    Homeless in the Last Year: No  Utilities: Not At Risk (05/25/2024)   Epic    Threatened with loss of utilities: No  Health Literacy: Inadequate Health Literacy (05/25/2024)   B1300 Health Literacy    Frequency of need for help with medical instructions: Sometimes   Family History  Problem Relation Age of Onset   Heart disease Sister        Congeital (died age 60) with enlarged heart   CAD Sister    Diabetes Mother    Heart disease Mother        Later onset heart disease    Dementia Mother    Alcohol abuse Father    Liver disease Father    CAD Sister 60       CABG   Early death Sister    Diabetes Brother    Kidney disease Brother        related to DM   Diabetes Brother     Objective: Office vital signs reviewed. BP (!) 159/64   Pulse 67   Temp 97.7 F (36.5 C)   Ht 5' 8 (1.727 m)   Wt 114 lb 2 oz (51.8 kg)   SpO2 99%   BMI 17.35 kg/m   Physical Examination:  General: Awake, alert, nontoxic elderly female, No acute distress HEENT: Left eye with injected sclera Cardio: regular rate and rhythm, S1S2 heard, no murmurs appreciated Pulm: clear to auscultation bilaterally, no wheezes, rhonchi or rales; normal work of breathing on room air MSK: Gross deformity of the left pinky which appears to be dislocated.  She has osteoarthritic changes throughout the hands bilaterally but no gross soft tissue swelling or warmth noted.  Ulnar deviation of the fingers throughout  Assessment/ Plan: 79 y.o. female   Rheumatoid arthritis involving multiple sites with positive rheumatoid factor (HCC) - Plan: Ambulatory referral to Rheumatology, predniSONE  (DELTASONE ) 10 MG tablet, predniSONE  (DELTASONE ) 2.5 MG tablet  Other deformity of left finger(s) - Plan: DG Hand Complete Left, Ambulatory referral to Orthopedic Surgery  Type 2 diabetes mellitus with diabetic neuropathy, with long-term  current use of insulin  (HCC)   Going to put her on a prednisone  pack and then transition over to 2.5 mg of prednisone  daily which I believe is what she was on when she was last with her specialist put on referring her back to rheumatology for ongoing maintenance and treatment plans.  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  I think that orthopedics would be warranted but again I am not sure that she would be a good surgical candidate given advanced renal disease etc.  Watch BGs given use of prednisone . Continue CGM for close monitoring  Erica Akey CHRISTELLA Fielding, DO [facility] [phone]     [1]  Allergies Allergen Reactions   Iodinated Contrast Media Other (See Comments)    Unknown    Latex Other (See Comments)    Unknown    Shellfish Allergy Other (See Comments)    Unknown    Lisinopril Swelling    Lips   [2]  Current Outpatient Medications:    acetaminophen  (TYLENOL ) 500 MG tablet, Take 500-1,000 mg by mouth every 6 (six) hours as needed for moderate pain (pain score 4-6)., Disp: , Rfl:    aspirin  EC 81 MG tablet, Take 81 mg by mouth in the morning., Disp: , Rfl:    atorvastatin  (LIPITOR) 10 MG tablet, TAKE 1 TABLET DAILY, Disp: 90 tablet, Rfl: 3   carvedilol  (COREG ) 6.25 MG tablet, TAKE ONE TABLET TWICE DAILY, Disp: 180 tablet, Rfl: 3   Continuous Glucose Receiver ([name] 3 READER) DEVI, with compatible [name] 3 sensor to monitor glucose continuously. DX E11.65, Disp: 1 each, Rfl: 1   Continuous Glucose Sensor ([name] 3 PLUS SENSOR) MISC, Change sensor every 15 days. DX: E11.65, Disp: 6 each, Rfl: 5   fluconazole  (DIFLUCAN ) 150 MG tablet, Take 1 tablet (150 mg total) by mouth every three (3) days as needed. (Patient not taking: Reported on 05/25/2024), Disp: 3 tablet, Rfl: 0   folic acid  (FOLVITE ) 1 MG tablet, TAKE ONE TABLET ONCE DAILY,  Disp: 90 tablet, Rfl: 0   insulin  glargine (LANTUS  SOLOSTAR) 100 UNIT/ML Solostar Pen, Inject 5 Units into the skin daily. (Patient taking differently: Inject 5 Units into the skin daily as needed (high blood sugar).), Disp: , Rfl:    ofloxacin (OCUFLOX) 0.3 % ophthalmic solution, Place 1 drop into the left eye 4 (four) times daily., Disp: , Rfl:    Omega-3 Fatty Acids (FISH OIL PO), Take 1 capsule by mouth in the morning., Disp: , Rfl:    pantoprazole  (PROTONIX ) 40 MG tablet, TAKE ONE TABLET DAILY, Disp: 90 tablet, Rfl: 3   prednisoLONE acetate (PRED FORTE) 1 % ophthalmic suspension, Place 1 drop into the left eye See admin instructions. Place one drop in the left eye four times daily for 7 days, then twice daily for 7 days, the once daily for 14 days, then stop., Disp: , Rfl:    RESTASIS 0.05 % ophthalmic emulsion, Place 1 drop into the right eye 2 (two) times daily., Disp: , Rfl:   "

## 2024-07-10 ENCOUNTER — Ambulatory Visit: Payer: Self-pay | Admitting: Family Medicine

## 2024-08-03 ENCOUNTER — Ambulatory Visit: Admitting: Physician Assistant

## 2024-08-03 ENCOUNTER — Ambulatory Visit

## 2024-08-11 ENCOUNTER — Ambulatory Visit: Admitting: Family Medicine

## 2025-05-26 ENCOUNTER — Ambulatory Visit
